# Patient Record
Sex: Male | Born: 1935 | Race: White | Hispanic: No | Smoking: Former smoker
Health system: Southern US, Community
[De-identification: ages and names within clinical notes are randomized; demographics above are authoritative.]

## PROBLEM LIST (undated history)

## (undated) DIAGNOSIS — G609 Hereditary and idiopathic neuropathy, unspecified: Secondary | ICD-10-CM

## (undated) DIAGNOSIS — E119 Type 2 diabetes mellitus without complications: Secondary | ICD-10-CM

## (undated) DIAGNOSIS — C679 Malignant neoplasm of bladder, unspecified: Secondary | ICD-10-CM

## (undated) DIAGNOSIS — E785 Hyperlipidemia, unspecified: Secondary | ICD-10-CM

## (undated) DIAGNOSIS — Z86718 Personal history of other venous thrombosis and embolism: Secondary | ICD-10-CM

## (undated) DIAGNOSIS — R509 Fever, unspecified: Secondary | ICD-10-CM

## (undated) DIAGNOSIS — E440 Moderate protein-calorie malnutrition: Secondary | ICD-10-CM

## (undated) DIAGNOSIS — K5900 Constipation, unspecified: Secondary | ICD-10-CM

## (undated) DIAGNOSIS — I82409 Acute embolism and thrombosis of unspecified deep veins of unspecified lower extremity: Secondary | ICD-10-CM

## (undated) DIAGNOSIS — I2510 Atherosclerotic heart disease of native coronary artery without angina pectoris: Secondary | ICD-10-CM

## (undated) DIAGNOSIS — D638 Anemia in other chronic diseases classified elsewhere: Secondary | ICD-10-CM

## (undated) DIAGNOSIS — R54 Age-related physical debility: Secondary | ICD-10-CM

## (undated) DIAGNOSIS — I10 Essential (primary) hypertension: Secondary | ICD-10-CM

## (undated) DIAGNOSIS — K5730 Diverticulosis of large intestine without perforation or abscess without bleeding: Secondary | ICD-10-CM

## (undated) DIAGNOSIS — R399 Unspecified symptoms and signs involving the genitourinary system: Secondary | ICD-10-CM

## (undated) DIAGNOSIS — R7881 Bacteremia: Secondary | ICD-10-CM

## (undated) DIAGNOSIS — Z9889 Other specified postprocedural states: Secondary | ICD-10-CM

## (undated) DIAGNOSIS — D649 Anemia, unspecified: Secondary | ICD-10-CM

## (undated) DIAGNOSIS — C259 Malignant neoplasm of pancreas, unspecified: Secondary | ICD-10-CM

## (undated) DIAGNOSIS — R109 Unspecified abdominal pain: Secondary | ICD-10-CM

## (undated) DIAGNOSIS — Z8781 Personal history of (healed) traumatic fracture: Secondary | ICD-10-CM

## (undated) DIAGNOSIS — B955 Unspecified streptococcus as the cause of diseases classified elsewhere: Secondary | ICD-10-CM

## (undated) DIAGNOSIS — Z974 Presence of external hearing-aid: Secondary | ICD-10-CM

## (undated) DIAGNOSIS — G4700 Insomnia, unspecified: Secondary | ICD-10-CM

## (undated) DIAGNOSIS — M1990 Unspecified osteoarthritis, unspecified site: Secondary | ICD-10-CM

## (undated) DIAGNOSIS — R740 Nonspecific elevation of levels of transaminase and lactic acid dehydrogenase [LDH]: Secondary | ICD-10-CM

## (undated) DIAGNOSIS — G9340 Encephalopathy, unspecified: Secondary | ICD-10-CM

## (undated) DIAGNOSIS — K589 Irritable bowel syndrome without diarrhea: Secondary | ICD-10-CM

## (undated) DIAGNOSIS — I5022 Chronic systolic (congestive) heart failure: Secondary | ICD-10-CM

## (undated) DIAGNOSIS — K219 Gastro-esophageal reflux disease without esophagitis: Secondary | ICD-10-CM

## (undated) DIAGNOSIS — Z973 Presence of spectacles and contact lenses: Secondary | ICD-10-CM

## (undated) DIAGNOSIS — R2681 Unsteadiness on feet: Secondary | ICD-10-CM

## (undated) DIAGNOSIS — R001 Bradycardia, unspecified: Secondary | ICD-10-CM

## (undated) DIAGNOSIS — R531 Weakness: Secondary | ICD-10-CM

## (undated) DIAGNOSIS — Z85828 Personal history of other malignant neoplasm of skin: Secondary | ICD-10-CM

## (undated) DIAGNOSIS — C787 Secondary malignant neoplasm of liver and intrahepatic bile duct: Secondary | ICD-10-CM

## (undated) DIAGNOSIS — Z7901 Long term (current) use of anticoagulants: Secondary | ICD-10-CM

## (undated) DIAGNOSIS — R609 Edema, unspecified: Secondary | ICD-10-CM

## (undated) DIAGNOSIS — L8992 Pressure ulcer of unspecified site, stage 2: Secondary | ICD-10-CM

## (undated) DIAGNOSIS — R296 Repeated falls: Secondary | ICD-10-CM

## (undated) DIAGNOSIS — IMO0002 Reserved for concepts with insufficient information to code with codable children: Secondary | ICD-10-CM

## (undated) DIAGNOSIS — Z860101 Personal history of adenomatous and serrated colon polyps: Secondary | ICD-10-CM

## (undated) DIAGNOSIS — M48061 Spinal stenosis, lumbar region without neurogenic claudication: Secondary | ICD-10-CM

## (undated) DIAGNOSIS — C799 Secondary malignant neoplasm of unspecified site: Secondary | ICD-10-CM

## (undated) DIAGNOSIS — Z8739 Personal history of other diseases of the musculoskeletal system and connective tissue: Principal | ICD-10-CM

## (undated) DIAGNOSIS — I428 Other cardiomyopathies: Secondary | ICD-10-CM

## (undated) DIAGNOSIS — Z86010 Personal history of colonic polyps: Secondary | ICD-10-CM

## (undated) DIAGNOSIS — R935 Abnormal findings on diagnostic imaging of other abdominal regions, including retroperitoneum: Secondary | ICD-10-CM

## (undated) DIAGNOSIS — I447 Left bundle-branch block, unspecified: Secondary | ICD-10-CM

## (undated) DIAGNOSIS — Z8546 Personal history of malignant neoplasm of prostate: Secondary | ICD-10-CM

## (undated) DIAGNOSIS — E43 Unspecified severe protein-calorie malnutrition: Secondary | ICD-10-CM

## (undated) DIAGNOSIS — R627 Adult failure to thrive: Secondary | ICD-10-CM

## (undated) HISTORY — DX: Acute embolism and thrombosis of unspecified deep veins of unspecified lower extremity: I82.409

## (undated) HISTORY — DX: Unspecified severe protein-calorie malnutrition: E43

## (undated) HISTORY — DX: Secondary malignant neoplasm of unspecified site: C79.9

## (undated) HISTORY — DX: Nonspecific elevation of levels of transaminase and lactic acid dehydrogenase (ldh): R74.0

## (undated) HISTORY — DX: Other cardiomyopathies: I42.8

## (undated) HISTORY — DX: Unspecified osteoarthritis, unspecified site: M19.90

## (undated) HISTORY — DX: Moderate protein-calorie malnutrition: E44.0

## (undated) HISTORY — DX: Spinal stenosis, lumbar region without neurogenic claudication: M48.061

## (undated) HISTORY — PX: CARDIAC CATHETERIZATION: SHX172

## (undated) HISTORY — DX: Bacteremia: R78.81

## (undated) HISTORY — DX: Diverticulosis of large intestine without perforation or abscess without bleeding: K57.30

## (undated) HISTORY — DX: Fever, unspecified: R50.9

## (undated) HISTORY — DX: Malignant neoplasm of pancreas, unspecified: C25.9

## (undated) HISTORY — DX: Age-related physical debility: R54

## (undated) HISTORY — PX: CARDIOVASCULAR STRESS TEST: SHX262

## (undated) HISTORY — PX: TONSILLECTOMY: SUR1361

## (undated) HISTORY — PX: OTHER SURGICAL HISTORY: SHX169

## (undated) HISTORY — DX: Abnormal findings on diagnostic imaging of other abdominal regions, including retroperitoneum: R93.5

## (undated) HISTORY — DX: Weakness: R53.1

## (undated) HISTORY — DX: Hyperlipidemia, unspecified: E78.5

## (undated) HISTORY — DX: Insomnia, unspecified: G47.00

## (undated) HISTORY — DX: Encephalopathy, unspecified: G93.40

## (undated) HISTORY — PX: APPENDECTOMY: SHX54

## (undated) HISTORY — DX: Pressure ulcer of unspecified site, stage 2: L89.92

## (undated) HISTORY — DX: Essential (primary) hypertension: I10

## (undated) HISTORY — DX: Hereditary and idiopathic neuropathy, unspecified: G60.9

## (undated) HISTORY — DX: Anemia, unspecified: D64.9

## (undated) HISTORY — DX: Gastro-esophageal reflux disease without esophagitis: K21.9

## (undated) HISTORY — DX: Adult failure to thrive: R62.7

## (undated) HISTORY — DX: Repeated falls: R29.6

## (undated) HISTORY — DX: Secondary malignant neoplasm of liver and intrahepatic bile duct: C78.7

## (undated) HISTORY — DX: Atherosclerotic heart disease of native coronary artery without angina pectoris: I25.10

## (undated) HISTORY — PX: LUMBAR SPINE SURGERY: SHX701

## (undated) HISTORY — DX: Irritable bowel syndrome, unspecified: K58.9

## (undated) HISTORY — DX: Constipation, unspecified: K59.00

## (undated) HISTORY — DX: Personal history of other diseases of the musculoskeletal system and connective tissue: Z87.39

## (undated) HISTORY — DX: Type 2 diabetes mellitus without complications: E11.9

## (undated) HISTORY — DX: Unspecified abdominal pain: R10.9

## (undated) HISTORY — DX: Bradycardia, unspecified: R00.1

## (undated) HISTORY — DX: Edema, unspecified: R60.9

## (undated) HISTORY — DX: Anemia in other chronic diseases classified elsewhere: D63.8

## (undated) HISTORY — DX: Unspecified streptococcus as the cause of diseases classified elsewhere: B95.5

## (undated) HISTORY — DX: Personal history of malignant neoplasm of prostate: Z85.46

---

## 1983-07-21 HISTORY — PX: CHOLECYSTECTOMY OPEN: SUR202

## 1989-07-20 HISTORY — PX: KNEE ARTHROSCOPY: SUR90

## 1996-07-20 HISTORY — PX: INGUINAL HERNIA REPAIR: SUR1180

## 1998-05-09 ENCOUNTER — Other Ambulatory Visit: Admission: RE | Admit: 1998-05-09 | Discharge: 1998-05-09 | Payer: Self-pay | Admitting: Urology

## 1998-05-28 ENCOUNTER — Encounter: Admission: RE | Admit: 1998-05-28 | Discharge: 1998-08-26 | Payer: Self-pay | Admitting: Radiation Oncology

## 1998-07-20 HISTORY — PX: OTHER SURGICAL HISTORY: SHX169

## 1998-07-23 ENCOUNTER — Encounter: Payer: Self-pay | Admitting: Urology

## 1998-07-23 ENCOUNTER — Ambulatory Visit (HOSPITAL_BASED_OUTPATIENT_CLINIC_OR_DEPARTMENT_OTHER): Admission: RE | Admit: 1998-07-23 | Discharge: 1998-07-23 | Payer: Self-pay | Admitting: Urology

## 1998-09-02 ENCOUNTER — Encounter: Payer: Self-pay | Admitting: Urology

## 1998-09-02 ENCOUNTER — Ambulatory Visit (HOSPITAL_COMMUNITY): Admission: RE | Admit: 1998-09-02 | Discharge: 1998-09-02 | Payer: Self-pay | Admitting: Urology

## 1998-09-13 ENCOUNTER — Encounter: Admission: RE | Admit: 1998-09-13 | Discharge: 1998-12-12 | Payer: Self-pay | Admitting: Radiation Oncology

## 1999-05-14 ENCOUNTER — Other Ambulatory Visit: Admission: RE | Admit: 1999-05-14 | Discharge: 1999-05-14 | Payer: Self-pay | Admitting: Gastroenterology

## 1999-05-14 ENCOUNTER — Encounter (INDEPENDENT_AMBULATORY_CARE_PROVIDER_SITE_OTHER): Payer: Self-pay

## 1999-12-06 ENCOUNTER — Emergency Department (HOSPITAL_COMMUNITY): Admission: EM | Admit: 1999-12-06 | Discharge: 1999-12-06 | Payer: Self-pay | Admitting: Emergency Medicine

## 1999-12-06 ENCOUNTER — Encounter: Payer: Self-pay | Admitting: Emergency Medicine

## 2000-06-07 ENCOUNTER — Other Ambulatory Visit: Admission: RE | Admit: 2000-06-07 | Discharge: 2000-06-07 | Payer: Self-pay | Admitting: Gastroenterology

## 2000-06-07 ENCOUNTER — Encounter (INDEPENDENT_AMBULATORY_CARE_PROVIDER_SITE_OTHER): Payer: Self-pay

## 2000-07-26 ENCOUNTER — Inpatient Hospital Stay (HOSPITAL_COMMUNITY): Admission: AD | Admit: 2000-07-26 | Discharge: 2000-07-31 | Payer: Self-pay | Admitting: Pulmonary Disease

## 2000-07-26 ENCOUNTER — Encounter: Payer: Self-pay | Admitting: Pulmonary Disease

## 2001-06-14 ENCOUNTER — Ambulatory Visit (HOSPITAL_COMMUNITY): Admission: RE | Admit: 2001-06-14 | Discharge: 2001-06-14 | Payer: Self-pay | Admitting: Cardiology

## 2001-06-19 ENCOUNTER — Encounter: Payer: Self-pay | Admitting: Emergency Medicine

## 2001-06-19 ENCOUNTER — Emergency Department (HOSPITAL_COMMUNITY): Admission: EM | Admit: 2001-06-19 | Discharge: 2001-06-20 | Payer: Self-pay | Admitting: Emergency Medicine

## 2002-07-20 HISTORY — PX: CATARACT EXTRACTION W/ INTRAOCULAR LENS IMPLANT: SHX1309

## 2003-12-06 ENCOUNTER — Ambulatory Visit (HOSPITAL_COMMUNITY): Admission: RE | Admit: 2003-12-06 | Discharge: 2003-12-06 | Payer: Self-pay | Admitting: Pulmonary Disease

## 2004-03-19 ENCOUNTER — Encounter: Admission: RE | Admit: 2004-03-19 | Discharge: 2004-03-19 | Payer: Self-pay | Admitting: Specialist

## 2004-06-11 ENCOUNTER — Ambulatory Visit: Payer: Self-pay | Admitting: Cardiology

## 2004-07-09 ENCOUNTER — Ambulatory Visit: Payer: Self-pay | Admitting: *Deleted

## 2004-08-06 ENCOUNTER — Ambulatory Visit: Payer: Self-pay | Admitting: Cardiology

## 2004-08-27 ENCOUNTER — Ambulatory Visit: Payer: Self-pay | Admitting: Internal Medicine

## 2004-09-10 ENCOUNTER — Ambulatory Visit: Payer: Self-pay | Admitting: Cardiology

## 2004-10-08 ENCOUNTER — Ambulatory Visit: Payer: Self-pay | Admitting: Cardiovascular Disease

## 2004-11-05 ENCOUNTER — Ambulatory Visit: Payer: Self-pay | Admitting: Internal Medicine

## 2004-12-03 ENCOUNTER — Ambulatory Visit: Payer: Self-pay | Admitting: Cardiology

## 2005-01-01 ENCOUNTER — Ambulatory Visit: Payer: Self-pay | Admitting: Internal Medicine

## 2005-01-29 ENCOUNTER — Ambulatory Visit: Payer: Self-pay | Admitting: Cardiology

## 2005-02-25 ENCOUNTER — Ambulatory Visit: Payer: Self-pay | Admitting: Cardiology

## 2005-03-09 ENCOUNTER — Ambulatory Visit: Payer: Self-pay | Admitting: Pulmonary Disease

## 2005-03-18 ENCOUNTER — Ambulatory Visit: Payer: Self-pay | Admitting: *Deleted

## 2005-04-08 ENCOUNTER — Ambulatory Visit: Payer: Self-pay | Admitting: Cardiology

## 2005-05-06 ENCOUNTER — Ambulatory Visit: Payer: Self-pay | Admitting: Cardiology

## 2005-05-07 ENCOUNTER — Ambulatory Visit: Payer: Self-pay | Admitting: Internal Medicine

## 2005-07-01 ENCOUNTER — Ambulatory Visit: Payer: Self-pay | Admitting: Cardiology

## 2005-07-29 ENCOUNTER — Ambulatory Visit: Payer: Self-pay | Admitting: *Deleted

## 2005-08-27 ENCOUNTER — Ambulatory Visit: Payer: Self-pay | Admitting: *Deleted

## 2005-09-09 ENCOUNTER — Ambulatory Visit: Payer: Self-pay | Admitting: Pulmonary Disease

## 2005-09-11 ENCOUNTER — Ambulatory Visit: Payer: Self-pay | Admitting: Pulmonary Disease

## 2005-09-23 ENCOUNTER — Ambulatory Visit: Payer: Self-pay | Admitting: Internal Medicine

## 2005-10-22 ENCOUNTER — Ambulatory Visit: Payer: Self-pay | Admitting: Cardiology

## 2005-10-26 ENCOUNTER — Encounter: Payer: Self-pay | Admitting: Internal Medicine

## 2005-10-26 ENCOUNTER — Ambulatory Visit: Payer: Self-pay

## 2005-10-29 ENCOUNTER — Ambulatory Visit: Payer: Self-pay | Admitting: Cardiology

## 2005-11-18 ENCOUNTER — Ambulatory Visit: Payer: Self-pay | Admitting: Cardiology

## 2005-12-02 ENCOUNTER — Ambulatory Visit: Payer: Self-pay | Admitting: Cardiology

## 2005-12-16 ENCOUNTER — Ambulatory Visit: Payer: Self-pay | Admitting: Cardiovascular Disease

## 2005-12-21 ENCOUNTER — Ambulatory Visit: Payer: Self-pay | Admitting: Pulmonary Disease

## 2006-01-12 ENCOUNTER — Emergency Department (HOSPITAL_COMMUNITY): Admission: EM | Admit: 2006-01-12 | Discharge: 2006-01-12 | Payer: Self-pay | Admitting: Family Medicine

## 2006-01-13 ENCOUNTER — Ambulatory Visit: Payer: Self-pay | Admitting: Cardiology

## 2006-02-10 ENCOUNTER — Ambulatory Visit: Payer: Self-pay | Admitting: Cardiology

## 2006-02-22 ENCOUNTER — Ambulatory Visit: Payer: Self-pay | Admitting: Pulmonary Disease

## 2006-02-24 ENCOUNTER — Ambulatory Visit: Payer: Self-pay | Admitting: Cardiology

## 2006-03-17 ENCOUNTER — Ambulatory Visit: Payer: Self-pay | Admitting: Cardiology

## 2006-04-14 ENCOUNTER — Ambulatory Visit: Payer: Self-pay | Admitting: Cardiology

## 2006-04-28 ENCOUNTER — Emergency Department (HOSPITAL_COMMUNITY): Admission: EM | Admit: 2006-04-28 | Discharge: 2006-04-28 | Payer: Self-pay | Admitting: Emergency Medicine

## 2006-05-05 ENCOUNTER — Ambulatory Visit: Payer: Self-pay | Admitting: Pulmonary Disease

## 2006-05-10 ENCOUNTER — Inpatient Hospital Stay (HOSPITAL_COMMUNITY): Admission: EM | Admit: 2006-05-10 | Discharge: 2006-05-14 | Payer: Self-pay | Admitting: Emergency Medicine

## 2006-05-13 ENCOUNTER — Ambulatory Visit: Payer: Self-pay | Admitting: Gastroenterology

## 2006-05-26 ENCOUNTER — Ambulatory Visit: Payer: Self-pay | Admitting: Cardiology

## 2006-05-28 ENCOUNTER — Ambulatory Visit: Payer: Self-pay | Admitting: Gastroenterology

## 2006-06-09 ENCOUNTER — Ambulatory Visit: Payer: Self-pay | Admitting: Cardiology

## 2006-06-14 ENCOUNTER — Ambulatory Visit: Payer: Self-pay | Admitting: Gastroenterology

## 2006-06-18 ENCOUNTER — Encounter: Admission: RE | Admit: 2006-06-18 | Discharge: 2006-06-18 | Payer: Self-pay | Admitting: General Surgery

## 2006-06-21 ENCOUNTER — Ambulatory Visit (HOSPITAL_BASED_OUTPATIENT_CLINIC_OR_DEPARTMENT_OTHER): Admission: RE | Admit: 2006-06-21 | Discharge: 2006-06-21 | Payer: Self-pay | Admitting: General Surgery

## 2006-06-28 ENCOUNTER — Ambulatory Visit: Payer: Self-pay | Admitting: Cardiology

## 2006-07-09 ENCOUNTER — Ambulatory Visit: Payer: Self-pay | Admitting: Cardiovascular Disease

## 2006-08-04 ENCOUNTER — Ambulatory Visit: Payer: Self-pay | Admitting: Cardiology

## 2006-08-25 ENCOUNTER — Ambulatory Visit: Payer: Self-pay | Admitting: Cardiology

## 2006-09-08 ENCOUNTER — Ambulatory Visit: Payer: Self-pay | Admitting: Pulmonary Disease

## 2006-09-15 ENCOUNTER — Ambulatory Visit: Payer: Self-pay | Admitting: Pulmonary Disease

## 2006-09-15 LAB — CONVERTED CEMR LAB
ALT: 16 units/L (ref 0–40)
AST: 18 units/L (ref 0–37)
Basophils Relative: 0.2 % (ref 0.0–1.0)
Bilirubin, Direct: 0.1 mg/dL (ref 0.0–0.3)
CO2: 32 meq/L (ref 19–32)
Calcium: 9.7 mg/dL (ref 8.4–10.5)
Chloride: 106 meq/L (ref 96–112)
Eosinophils Relative: 1.9 % (ref 0.0–5.0)
Glucose, Bld: 126 mg/dL — ABNORMAL HIGH (ref 70–99)
HCT: 40.2 % (ref 39.0–52.0)
HDL: 54.6 mg/dL (ref >39.0)
Lymphocytes Relative: 30.8 % (ref 12.0–46.0)
Neutro Abs: 3 10*3/uL (ref 1.4–7.7)
Neutrophils Relative %: 61.1 % (ref 43.0–77.0)
Platelets: 205 10*3/uL (ref 150–400)
RBC: 4.27 M/uL (ref 4.22–5.81)
Total Protein: 7 g/dL (ref 6.0–8.3)
VLDL: 22 mg/dL (ref 0–40)
WBC: 4.9 10*3/uL (ref 4.5–10.5)

## 2006-09-21 ENCOUNTER — Ambulatory Visit: Payer: Self-pay | Admitting: Cardiology

## 2006-10-20 ENCOUNTER — Ambulatory Visit: Payer: Self-pay | Admitting: Cardiology

## 2006-11-03 ENCOUNTER — Ambulatory Visit: Payer: Self-pay | Admitting: Cardiology

## 2006-12-01 ENCOUNTER — Ambulatory Visit: Payer: Self-pay | Admitting: *Deleted

## 2006-12-15 ENCOUNTER — Ambulatory Visit: Payer: Self-pay | Admitting: Cardiology

## 2006-12-15 LAB — CONVERTED CEMR LAB
ALT: 20 units/L (ref 0–40)
Albumin: 4.2 g/dL (ref 3.5–5.2)
Alkaline Phosphatase: 44 units/L (ref 39–117)
Total CHOL/HDL Ratio: 2.8
VLDL: 15 mg/dL (ref 0–40)

## 2006-12-29 ENCOUNTER — Ambulatory Visit: Payer: Self-pay | Admitting: Cardiology

## 2007-01-11 ENCOUNTER — Ambulatory Visit: Payer: Self-pay | Admitting: Cardiology

## 2007-02-02 ENCOUNTER — Ambulatory Visit: Payer: Self-pay | Admitting: Pulmonary Disease

## 2007-02-07 ENCOUNTER — Ambulatory Visit: Payer: Self-pay

## 2007-02-09 ENCOUNTER — Ambulatory Visit: Payer: Self-pay | Admitting: Cardiology

## 2007-02-21 ENCOUNTER — Inpatient Hospital Stay (HOSPITAL_COMMUNITY): Admission: RE | Admit: 2007-02-21 | Discharge: 2007-02-24 | Payer: Self-pay | Admitting: Orthopedic Surgery

## 2007-02-21 HISTORY — PX: TOTAL KNEE ARTHROPLASTY: SHX125

## 2007-03-29 ENCOUNTER — Ambulatory Visit: Payer: Self-pay | Admitting: Cardiology

## 2007-04-21 ENCOUNTER — Ambulatory Visit (HOSPITAL_COMMUNITY): Admission: RE | Admit: 2007-04-21 | Discharge: 2007-04-22 | Payer: Self-pay | Admitting: Orthopedic Surgery

## 2007-04-26 ENCOUNTER — Ambulatory Visit: Payer: Self-pay | Admitting: Cardiovascular Disease

## 2007-05-03 DIAGNOSIS — K589 Irritable bowel syndrome without diarrhea: Secondary | ICD-10-CM | POA: Insufficient documentation

## 2007-05-03 DIAGNOSIS — E1140 Type 2 diabetes mellitus with diabetic neuropathy, unspecified: Secondary | ICD-10-CM

## 2007-05-03 DIAGNOSIS — K219 Gastro-esophageal reflux disease without esophagitis: Secondary | ICD-10-CM

## 2007-05-03 DIAGNOSIS — Z8546 Personal history of malignant neoplasm of prostate: Secondary | ICD-10-CM

## 2007-05-03 DIAGNOSIS — E119 Type 2 diabetes mellitus without complications: Secondary | ICD-10-CM

## 2007-05-03 DIAGNOSIS — I82409 Acute embolism and thrombosis of unspecified deep veins of unspecified lower extremity: Secondary | ICD-10-CM

## 2007-05-03 DIAGNOSIS — M1990 Unspecified osteoarthritis, unspecified site: Secondary | ICD-10-CM

## 2007-05-03 DIAGNOSIS — E785 Hyperlipidemia, unspecified: Secondary | ICD-10-CM

## 2007-05-03 HISTORY — DX: Hyperlipidemia, unspecified: E78.5

## 2007-05-03 HISTORY — DX: Unspecified osteoarthritis, unspecified site: M19.90

## 2007-05-03 HISTORY — DX: Gastro-esophageal reflux disease without esophagitis: K21.9

## 2007-05-03 HISTORY — DX: Acute embolism and thrombosis of unspecified deep veins of unspecified lower extremity: I82.409

## 2007-05-03 HISTORY — DX: Type 2 diabetes mellitus without complications: E11.9

## 2007-05-04 ENCOUNTER — Ambulatory Visit: Payer: Self-pay | Admitting: Cardiovascular Disease

## 2007-05-18 ENCOUNTER — Ambulatory Visit: Payer: Self-pay | Admitting: Cardiology

## 2007-05-24 ENCOUNTER — Ambulatory Visit: Payer: Self-pay | Admitting: Pulmonary Disease

## 2007-05-25 ENCOUNTER — Ambulatory Visit: Payer: Self-pay | Admitting: Cardiology

## 2007-05-25 LAB — CONVERTED CEMR LAB
Basophils Absolute: 0 10*3/uL (ref 0.0–0.1)
Creatinine, Ser: 1 mg/dL (ref 0.4–1.5)
HCT: 38.4 % — ABNORMAL LOW (ref 39.0–52.0)
Hemoglobin: 13.6 g/dL (ref 13.0–17.0)
MCHC: 35.3 g/dL (ref 30.0–36.0)
MCV: 92.6 fL (ref 78.0–100.0)
Monocytes Absolute: 0.4 10*3/uL (ref 0.2–0.7)
Neutrophils Relative %: 70.3 % (ref 43.0–77.0)
Potassium: 4.3 meq/L (ref 3.5–5.1)
Pro B Natriuretic peptide (BNP): 61 pg/mL (ref 0.0–100.0)
RDW: 12.9 % (ref 11.5–14.6)
Sodium: 141 meq/L (ref 135–145)

## 2007-06-03 ENCOUNTER — Ambulatory Visit: Payer: Self-pay | Admitting: Cardiology

## 2007-06-03 ENCOUNTER — Ambulatory Visit: Payer: Self-pay

## 2007-06-03 ENCOUNTER — Encounter: Payer: Self-pay | Admitting: Cardiology

## 2007-06-29 ENCOUNTER — Ambulatory Visit: Payer: Self-pay | Admitting: Cardiovascular Disease

## 2007-07-06 ENCOUNTER — Ambulatory Visit: Payer: Self-pay | Admitting: Pulmonary Disease

## 2007-07-06 DIAGNOSIS — I428 Other cardiomyopathies: Secondary | ICD-10-CM

## 2007-07-06 DIAGNOSIS — I2510 Atherosclerotic heart disease of native coronary artery without angina pectoris: Secondary | ICD-10-CM

## 2007-07-06 DIAGNOSIS — I447 Left bundle-branch block, unspecified: Secondary | ICD-10-CM

## 2007-07-06 DIAGNOSIS — M48061 Spinal stenosis, lumbar region without neurogenic claudication: Secondary | ICD-10-CM | POA: Insufficient documentation

## 2007-07-06 DIAGNOSIS — G609 Hereditary and idiopathic neuropathy, unspecified: Secondary | ICD-10-CM | POA: Insufficient documentation

## 2007-07-06 DIAGNOSIS — K5730 Diverticulosis of large intestine without perforation or abscess without bleeding: Secondary | ICD-10-CM | POA: Insufficient documentation

## 2007-07-06 HISTORY — DX: Other cardiomyopathies: I42.8

## 2007-07-06 HISTORY — DX: Atherosclerotic heart disease of native coronary artery without angina pectoris: I25.10

## 2007-07-06 HISTORY — DX: Spinal stenosis, lumbar region without neurogenic claudication: M48.061

## 2007-07-06 HISTORY — DX: Hereditary and idiopathic neuropathy, unspecified: G60.9

## 2007-07-27 ENCOUNTER — Ambulatory Visit: Payer: Self-pay | Admitting: Cardiovascular Disease

## 2007-08-24 ENCOUNTER — Ambulatory Visit: Payer: Self-pay | Admitting: Internal Medicine

## 2007-09-18 HISTORY — PX: MOHS SURGERY: SUR867

## 2007-09-21 ENCOUNTER — Ambulatory Visit: Payer: Self-pay | Admitting: Cardiology

## 2007-10-19 ENCOUNTER — Ambulatory Visit: Payer: Self-pay | Admitting: Internal Medicine

## 2007-10-24 ENCOUNTER — Telehealth: Payer: Self-pay | Admitting: Pulmonary Disease

## 2007-11-16 ENCOUNTER — Ambulatory Visit: Payer: Self-pay | Admitting: Cardiology

## 2007-12-01 ENCOUNTER — Ambulatory Visit: Payer: Self-pay | Admitting: Cardiology

## 2007-12-19 ENCOUNTER — Ambulatory Visit: Payer: Self-pay | Admitting: Cardiovascular Disease

## 2008-01-04 ENCOUNTER — Ambulatory Visit: Payer: Self-pay | Admitting: Pulmonary Disease

## 2008-01-06 ENCOUNTER — Ambulatory Visit: Payer: Self-pay | Admitting: Pulmonary Disease

## 2008-01-07 LAB — CONVERTED CEMR LAB
ALT: 16 units/L (ref 0–53)
AST: 18 units/L (ref 0–37)
Albumin: 3.9 g/dL (ref 3.5–5.2)
Alkaline Phosphatase: 44 units/L (ref 39–117)
BUN: 20 mg/dL (ref 6–23)
Basophils Relative: 0.4 % (ref 0.0–1.0)
CO2: 31 meq/L (ref 19–32)
Chloride: 104 meq/L (ref 96–112)
Creatinine, Ser: 1.1 mg/dL (ref 0.4–1.5)
Eosinophils Relative: 3.1 % (ref 0.0–5.0)
Glucose, Bld: 118 mg/dL — ABNORMAL HIGH (ref 70–99)
Hgb A1c MFr Bld: 6.3 % — ABNORMAL HIGH (ref 4.6–6.0)
LDL Cholesterol: 110 mg/dL — ABNORMAL HIGH (ref 0–99)
Lymphocytes Relative: 31.2 % (ref 12.0–46.0)
MCV: 96.1 fL (ref 78.0–100.0)
Monocytes Relative: 7.3 % (ref 3.0–12.0)
Neutrophils Relative %: 58 % (ref 43.0–77.0)
RBC: 3.9 M/uL — ABNORMAL LOW (ref 4.22–5.81)
Total CHOL/HDL Ratio: 3.3
VLDL: 16 mg/dL (ref 0–40)
WBC: 5.1 10*3/uL (ref 4.5–10.5)

## 2008-01-18 ENCOUNTER — Ambulatory Visit: Payer: Self-pay | Admitting: Internal Medicine

## 2008-01-27 ENCOUNTER — Encounter: Payer: Self-pay | Admitting: Pulmonary Disease

## 2008-02-15 ENCOUNTER — Ambulatory Visit: Payer: Self-pay | Admitting: Cardiology

## 2008-03-14 ENCOUNTER — Ambulatory Visit: Payer: Self-pay | Admitting: Cardiovascular Disease

## 2008-04-11 ENCOUNTER — Ambulatory Visit: Payer: Self-pay | Admitting: Cardiology

## 2008-05-02 ENCOUNTER — Ambulatory Visit: Payer: Self-pay | Admitting: Cardiology

## 2008-05-09 ENCOUNTER — Telehealth: Payer: Self-pay | Admitting: Pulmonary Disease

## 2008-05-10 ENCOUNTER — Telehealth (INDEPENDENT_AMBULATORY_CARE_PROVIDER_SITE_OTHER): Payer: Self-pay | Admitting: *Deleted

## 2008-05-23 ENCOUNTER — Ambulatory Visit: Payer: Self-pay | Admitting: Cardiovascular Disease

## 2008-06-13 ENCOUNTER — Ambulatory Visit: Payer: Self-pay | Admitting: Cardiovascular Disease

## 2008-06-28 ENCOUNTER — Telehealth (INDEPENDENT_AMBULATORY_CARE_PROVIDER_SITE_OTHER): Payer: Self-pay | Admitting: *Deleted

## 2008-07-11 ENCOUNTER — Ambulatory Visit: Payer: Self-pay | Admitting: Cardiovascular Disease

## 2008-08-08 ENCOUNTER — Ambulatory Visit: Payer: Self-pay | Admitting: Internal Medicine

## 2008-08-22 ENCOUNTER — Ambulatory Visit: Payer: Self-pay | Admitting: Cardiology

## 2008-08-22 ENCOUNTER — Ambulatory Visit: Payer: Self-pay | Admitting: Pulmonary Disease

## 2008-08-23 ENCOUNTER — Ambulatory Visit: Payer: Self-pay | Admitting: Pulmonary Disease

## 2008-08-26 LAB — CONVERTED CEMR LAB
ALT: 14 units/L (ref 0–53)
AST: 22 units/L (ref 0–37)
Alkaline Phosphatase: 44 units/L (ref 39–117)
Bilirubin, Direct: 0.1 mg/dL (ref 0.0–0.3)
CO2: 27 meq/L (ref 19–32)
Chloride: 107 meq/L (ref 96–112)
Eosinophils Relative: 0.1 % (ref 0.0–5.0)
Glucose, Bld: 189 mg/dL — ABNORMAL HIGH (ref 70–99)
HCT: 38.7 % — ABNORMAL LOW (ref 39.0–52.0)
Hemoglobin: 13.3 g/dL (ref 13.0–17.0)
Lymphocytes Relative: 8.9 % — ABNORMAL LOW (ref 12.0–46.0)
Monocytes Absolute: 0 10*3/uL — ABNORMAL LOW (ref 0.1–1.0)
Monocytes Relative: 0.5 % — ABNORMAL LOW (ref 3.0–12.0)
Mucus, UA: NEGATIVE
Neutro Abs: 8.4 10*3/uL — ABNORMAL HIGH (ref 1.4–7.7)
Nitrite: NEGATIVE
PSA: 0.02 ng/mL — ABNORMAL LOW (ref 0.10–4.00)
Platelets: 201 10*3/uL (ref 150–400)
Potassium: 5.1 meq/L (ref 3.5–5.1)
Sodium: 141 meq/L (ref 135–145)
Total Bilirubin: 0.7 mg/dL (ref 0.3–1.2)
Total CHOL/HDL Ratio: 3.5
Total Protein: 7.2 g/dL (ref 6.0–8.3)
Urine Glucose: NEGATIVE mg/dL
VLDL: 9 mg/dL (ref 0–40)
WBC: 9.2 10*3/uL (ref 4.5–10.5)
pH: 5.5 (ref 5.0–8.0)

## 2008-08-27 ENCOUNTER — Telehealth (INDEPENDENT_AMBULATORY_CARE_PROVIDER_SITE_OTHER): Payer: Self-pay | Admitting: *Deleted

## 2008-09-05 ENCOUNTER — Encounter: Payer: Self-pay | Admitting: Pulmonary Disease

## 2008-09-12 ENCOUNTER — Ambulatory Visit: Payer: Self-pay | Admitting: Cardiology

## 2008-09-20 ENCOUNTER — Encounter: Payer: Self-pay | Admitting: Pulmonary Disease

## 2008-10-10 ENCOUNTER — Ambulatory Visit: Payer: Self-pay | Admitting: Internal Medicine

## 2008-11-07 ENCOUNTER — Ambulatory Visit: Payer: Self-pay | Admitting: Internal Medicine

## 2008-11-20 DIAGNOSIS — I5022 Chronic systolic (congestive) heart failure: Secondary | ICD-10-CM

## 2008-11-21 ENCOUNTER — Ambulatory Visit: Payer: Self-pay | Admitting: Cardiovascular Disease

## 2008-11-21 ENCOUNTER — Ambulatory Visit: Payer: Self-pay | Admitting: Cardiology

## 2008-11-29 ENCOUNTER — Encounter: Payer: Self-pay | Admitting: Pulmonary Disease

## 2008-12-05 ENCOUNTER — Ambulatory Visit: Payer: Self-pay

## 2008-12-05 ENCOUNTER — Encounter: Payer: Self-pay | Admitting: Cardiology

## 2008-12-12 ENCOUNTER — Ambulatory Visit: Payer: Self-pay | Admitting: Internal Medicine

## 2008-12-18 ENCOUNTER — Encounter: Payer: Self-pay | Admitting: *Deleted

## 2008-12-26 ENCOUNTER — Telehealth: Payer: Self-pay | Admitting: Pulmonary Disease

## 2009-01-09 ENCOUNTER — Ambulatory Visit: Payer: Self-pay | Admitting: Internal Medicine

## 2009-01-23 ENCOUNTER — Encounter: Payer: Self-pay | Admitting: *Deleted

## 2009-02-06 ENCOUNTER — Ambulatory Visit: Payer: Self-pay | Admitting: Internal Medicine

## 2009-02-06 LAB — CONVERTED CEMR LAB
POC INR: 2.4
Prothrombin Time: 18.7 s

## 2009-02-11 ENCOUNTER — Telehealth: Payer: Self-pay | Admitting: Pulmonary Disease

## 2009-02-20 ENCOUNTER — Ambulatory Visit: Payer: Self-pay | Admitting: Pulmonary Disease

## 2009-02-25 ENCOUNTER — Ambulatory Visit: Payer: Self-pay | Admitting: Pulmonary Disease

## 2009-02-27 ENCOUNTER — Encounter: Payer: Self-pay | Admitting: Cardiology

## 2009-03-06 ENCOUNTER — Ambulatory Visit: Payer: Self-pay | Admitting: Internal Medicine

## 2009-03-06 LAB — CONVERTED CEMR LAB: POC INR: 3.2

## 2009-03-14 LAB — CONVERTED CEMR LAB
AST: 15 units/L (ref 0–37)
Albumin: 3.9 g/dL (ref 3.5–5.2)
Alkaline Phosphatase: 49 units/L (ref 39–117)
BUN: 21 mg/dL (ref 6–23)
Basophils Absolute: 0 10*3/uL (ref 0.0–0.1)
CO2: 30 meq/L (ref 19–32)
Calcium: 9.1 mg/dL (ref 8.4–10.5)
Cholesterol: 157 mg/dL (ref 0–200)
Eosinophils Relative: 2.1 % (ref 0.0–5.0)
Glucose, Bld: 109 mg/dL — ABNORMAL HIGH (ref 70–99)
HDL: 52.2 mg/dL (ref >39.00)
Hgb A1c MFr Bld: 6.3 % (ref 4.6–6.5)
Lymphocytes Relative: 28.4 % (ref 12.0–46.0)
Monocytes Relative: 5.5 % (ref 3.0–12.0)
PSA: 0.02 ng/mL — ABNORMAL LOW (ref 0.10–4.00)
Platelets: 175 10*3/uL (ref 150.0–400.0)
Potassium: 4.4 meq/L (ref 3.5–5.1)
RDW: 14 % (ref 11.5–14.6)
Sodium: 142 meq/L (ref 135–145)
TSH: 1.36 microintl units/mL (ref 0.35–5.50)
Total Protein: 7.4 g/dL (ref 6.0–8.3)
VLDL: 17.4 mg/dL (ref 0.0–40.0)
WBC: 6.4 10*3/uL (ref 4.5–10.5)

## 2009-04-03 ENCOUNTER — Ambulatory Visit: Payer: Self-pay | Admitting: Internal Medicine

## 2009-05-01 ENCOUNTER — Ambulatory Visit: Payer: Self-pay | Admitting: Internal Medicine

## 2009-05-29 ENCOUNTER — Encounter: Payer: Self-pay | Admitting: Pulmonary Disease

## 2009-05-29 ENCOUNTER — Ambulatory Visit: Payer: Self-pay | Admitting: Internal Medicine

## 2009-06-19 ENCOUNTER — Telehealth: Payer: Self-pay | Admitting: Pulmonary Disease

## 2009-06-19 ENCOUNTER — Encounter: Payer: Self-pay | Admitting: Pulmonary Disease

## 2009-06-19 ENCOUNTER — Encounter: Payer: Self-pay | Admitting: Cardiology

## 2009-06-21 ENCOUNTER — Telehealth: Payer: Self-pay | Admitting: Cardiology

## 2009-06-26 ENCOUNTER — Ambulatory Visit: Payer: Self-pay | Admitting: Cardiology

## 2009-06-26 ENCOUNTER — Telehealth: Payer: Self-pay | Admitting: Pulmonary Disease

## 2009-06-26 LAB — CONVERTED CEMR LAB: POC INR: 3.8

## 2009-06-28 ENCOUNTER — Telehealth: Payer: Self-pay | Admitting: Cardiology

## 2009-07-02 ENCOUNTER — Telehealth: Payer: Self-pay | Admitting: Cardiology

## 2009-07-03 ENCOUNTER — Ambulatory Visit: Payer: Self-pay

## 2009-07-03 ENCOUNTER — Ambulatory Visit: Payer: Self-pay | Admitting: Internal Medicine

## 2009-07-03 ENCOUNTER — Ambulatory Visit (HOSPITAL_COMMUNITY): Admission: RE | Admit: 2009-07-03 | Discharge: 2009-07-03 | Payer: Self-pay | Admitting: Cardiology

## 2009-07-03 ENCOUNTER — Ambulatory Visit: Payer: Self-pay | Admitting: Cardiology

## 2009-07-03 ENCOUNTER — Encounter: Payer: Self-pay | Admitting: Cardiology

## 2009-07-10 ENCOUNTER — Ambulatory Visit: Payer: Self-pay | Admitting: Internal Medicine

## 2009-07-10 ENCOUNTER — Encounter (INDEPENDENT_AMBULATORY_CARE_PROVIDER_SITE_OTHER): Payer: Self-pay | Admitting: Cardiology

## 2009-07-31 ENCOUNTER — Ambulatory Visit: Payer: Self-pay | Admitting: Internal Medicine

## 2009-07-31 ENCOUNTER — Encounter (INDEPENDENT_AMBULATORY_CARE_PROVIDER_SITE_OTHER): Payer: Self-pay | Admitting: Cardiology

## 2009-08-21 ENCOUNTER — Ambulatory Visit: Payer: Self-pay | Admitting: Pulmonary Disease

## 2009-08-26 ENCOUNTER — Ambulatory Visit: Payer: Self-pay | Admitting: Cardiovascular Disease

## 2009-08-26 LAB — CONVERTED CEMR LAB: POC INR: 3.7

## 2009-09-09 ENCOUNTER — Ambulatory Visit: Payer: Self-pay | Admitting: Internal Medicine

## 2009-09-09 LAB — CONVERTED CEMR LAB: POC INR: 2.4

## 2009-09-10 ENCOUNTER — Encounter: Payer: Self-pay | Admitting: Pulmonary Disease

## 2009-09-25 ENCOUNTER — Ambulatory Visit: Payer: Self-pay | Admitting: Cardiovascular Disease

## 2009-09-25 LAB — CONVERTED CEMR LAB: POC INR: 1.9

## 2009-10-07 ENCOUNTER — Telehealth: Payer: Self-pay | Admitting: Cardiology

## 2009-10-16 ENCOUNTER — Ambulatory Visit: Payer: Self-pay | Admitting: Cardiovascular Disease

## 2009-10-16 ENCOUNTER — Encounter: Payer: Self-pay | Admitting: Cardiology

## 2009-10-16 LAB — CONVERTED CEMR LAB: POC INR: 2

## 2009-10-18 ENCOUNTER — Telehealth: Payer: Self-pay | Admitting: Cardiology

## 2009-11-13 ENCOUNTER — Encounter: Payer: Self-pay | Admitting: Pulmonary Disease

## 2009-11-13 ENCOUNTER — Ambulatory Visit: Payer: Self-pay | Admitting: Internal Medicine

## 2009-11-13 ENCOUNTER — Telehealth: Payer: Self-pay | Admitting: Pulmonary Disease

## 2009-11-13 LAB — CONVERTED CEMR LAB: POC INR: 2.1

## 2009-12-11 ENCOUNTER — Ambulatory Visit: Payer: Self-pay | Admitting: Internal Medicine

## 2009-12-25 ENCOUNTER — Ambulatory Visit: Payer: Self-pay | Admitting: Cardiovascular Disease

## 2009-12-25 ENCOUNTER — Ambulatory Visit: Payer: Self-pay | Admitting: Cardiology

## 2010-01-01 ENCOUNTER — Ambulatory Visit: Payer: Self-pay | Admitting: Cardiology

## 2010-01-10 ENCOUNTER — Telehealth: Payer: Self-pay | Admitting: Cardiology

## 2010-01-10 LAB — CONVERTED CEMR LAB
CO2: 30 meq/L (ref 19–32)
Calcium: 9.6 mg/dL (ref 8.4–10.5)
GFR calc non Af Amer: 73 mL/min (ref >60)
Sodium: 141 meq/L (ref 135–145)

## 2010-01-15 ENCOUNTER — Ambulatory Visit: Payer: Self-pay | Admitting: Cardiology

## 2010-01-30 ENCOUNTER — Ambulatory Visit: Payer: Self-pay | Admitting: Internal Medicine

## 2010-01-30 LAB — CONVERTED CEMR LAB: POC INR: 2.1

## 2010-02-19 ENCOUNTER — Ambulatory Visit: Payer: Self-pay | Admitting: Cardiology

## 2010-02-19 ENCOUNTER — Ambulatory Visit: Payer: Self-pay | Admitting: Pulmonary Disease

## 2010-02-19 LAB — CONVERTED CEMR LAB: POC INR: 2.5

## 2010-02-20 ENCOUNTER — Ambulatory Visit: Payer: Self-pay | Admitting: Pulmonary Disease

## 2010-02-22 LAB — CONVERTED CEMR LAB
Albumin: 4.1 g/dL (ref 3.5–5.2)
Alkaline Phosphatase: 40 units/L (ref 39–117)
BUN: 25 mg/dL — ABNORMAL HIGH (ref 6–23)
Basophils Absolute: 0 10*3/uL (ref 0.0–0.1)
CO2: 27 meq/L (ref 19–32)
Calcium: 9.7 mg/dL (ref 8.4–10.5)
Chloride: 105 meq/L (ref 96–112)
Creatinine, Ser: 1 mg/dL (ref 0.4–1.5)
Eosinophils Relative: 2.9 % (ref 0.0–5.0)
Glucose, Bld: 112 mg/dL — ABNORMAL HIGH (ref 70–99)
HCT: 35.9 % — ABNORMAL LOW (ref 39.0–52.0)
Hemoglobin: 12.4 g/dL — ABNORMAL LOW (ref 13.0–17.0)
LDL Cholesterol: 102 mg/dL — ABNORMAL HIGH (ref 0–99)
Lymphs Abs: 1.7 10*3/uL (ref 0.7–4.0)
MCV: 94.6 fL (ref 78.0–100.0)
Monocytes Absolute: 0.3 10*3/uL (ref 0.1–1.0)
Monocytes Relative: 6.6 % (ref 3.0–12.0)
Neutro Abs: 2.2 10*3/uL (ref 1.4–7.7)
RDW: 14.2 % (ref 11.5–14.6)
Total Bilirubin: 0.6 mg/dL (ref 0.3–1.2)
Total CHOL/HDL Ratio: 3
Triglycerides: 87 mg/dL (ref 0.0–149.0)

## 2010-03-12 ENCOUNTER — Telehealth: Payer: Self-pay | Admitting: Pulmonary Disease

## 2010-03-19 ENCOUNTER — Ambulatory Visit: Payer: Self-pay | Admitting: Cardiology

## 2010-03-19 LAB — CONVERTED CEMR LAB: POC INR: 2.5

## 2010-04-01 ENCOUNTER — Telehealth: Payer: Self-pay | Admitting: Cardiology

## 2010-04-16 ENCOUNTER — Ambulatory Visit: Payer: Self-pay | Admitting: Internal Medicine

## 2010-04-16 LAB — CONVERTED CEMR LAB: POC INR: 3

## 2010-04-19 ENCOUNTER — Encounter: Payer: Self-pay | Admitting: Pulmonary Disease

## 2010-05-13 ENCOUNTER — Telehealth: Payer: Self-pay | Admitting: Pulmonary Disease

## 2010-05-14 ENCOUNTER — Ambulatory Visit: Payer: Self-pay | Admitting: Internal Medicine

## 2010-06-11 ENCOUNTER — Ambulatory Visit: Payer: Self-pay | Admitting: Internal Medicine

## 2010-06-11 LAB — CONVERTED CEMR LAB: POC INR: 2.2

## 2010-06-23 ENCOUNTER — Encounter: Payer: Self-pay | Admitting: Cardiology

## 2010-06-23 ENCOUNTER — Ambulatory Visit: Payer: Self-pay | Admitting: Cardiology

## 2010-06-23 DIAGNOSIS — I10 Essential (primary) hypertension: Secondary | ICD-10-CM

## 2010-06-23 HISTORY — DX: Essential (primary) hypertension: I10

## 2010-07-09 ENCOUNTER — Ambulatory Visit: Payer: Self-pay | Admitting: Cardiology

## 2010-08-06 ENCOUNTER — Ambulatory Visit: Admission: RE | Admit: 2010-08-06 | Discharge: 2010-08-06 | Payer: Self-pay | Source: Home / Self Care

## 2010-08-17 LAB — CONVERTED CEMR LAB
BUN: 21 mg/dL (ref 6–23)
BUN: 25 mg/dL — ABNORMAL HIGH (ref 6–23)
Basophils Absolute: 0 10*3/uL (ref 0.0–0.1)
CO2: 31 meq/L (ref 19–32)
Chloride: 107 meq/L (ref 96–112)
Creatinine, Ser: 1.2 mg/dL (ref 0.4–1.5)
Creatinine, Ser: 1.2 mg/dL (ref 0.4–1.5)
GFR calc non Af Amer: 63.65 mL/min (ref >60)
Glucose, Bld: 130 mg/dL — ABNORMAL HIGH (ref 70–99)
HCT: 35.4 % — ABNORMAL LOW (ref 39.0–52.0)
Lymphs Abs: 1.2 10*3/uL (ref 0.7–4.0)
Monocytes Absolute: 0.3 10*3/uL (ref 0.1–1.0)
Monocytes Relative: 5.7 % (ref 3.0–12.0)
Platelets: 200 10*3/uL (ref 150.0–400.0)
Potassium: 4.2 meq/L (ref 3.5–5.1)
Potassium: 4.6 meq/L (ref 3.5–5.1)
Pro B Natriuretic peptide (BNP): 60 pg/mL (ref 0.0–100.0)
RDW: 13.6 % (ref 11.5–14.6)

## 2010-08-20 ENCOUNTER — Ambulatory Visit (INDEPENDENT_AMBULATORY_CARE_PROVIDER_SITE_OTHER): Payer: Medicare Other | Admitting: Pulmonary Disease

## 2010-08-20 ENCOUNTER — Encounter: Payer: Self-pay | Admitting: Pulmonary Disease

## 2010-08-20 ENCOUNTER — Ambulatory Visit: Admit: 2010-08-20 | Payer: Self-pay | Admitting: Pulmonary Disease

## 2010-08-20 DIAGNOSIS — I82409 Acute embolism and thrombosis of unspecified deep veins of unspecified lower extremity: Secondary | ICD-10-CM

## 2010-08-20 DIAGNOSIS — E785 Hyperlipidemia, unspecified: Secondary | ICD-10-CM

## 2010-08-20 DIAGNOSIS — I2510 Atherosclerotic heart disease of native coronary artery without angina pectoris: Secondary | ICD-10-CM

## 2010-08-20 DIAGNOSIS — E119 Type 2 diabetes mellitus without complications: Secondary | ICD-10-CM

## 2010-08-20 DIAGNOSIS — I447 Left bundle-branch block, unspecified: Secondary | ICD-10-CM

## 2010-08-20 DIAGNOSIS — I10 Essential (primary) hypertension: Secondary | ICD-10-CM

## 2010-08-20 DIAGNOSIS — I5022 Chronic systolic (congestive) heart failure: Secondary | ICD-10-CM

## 2010-08-20 DIAGNOSIS — I428 Other cardiomyopathies: Secondary | ICD-10-CM

## 2010-08-21 ENCOUNTER — Other Ambulatory Visit: Payer: Self-pay | Admitting: Pulmonary Disease

## 2010-08-21 ENCOUNTER — Other Ambulatory Visit: Payer: Medicare Other

## 2010-08-21 LAB — BASIC METABOLIC PANEL
BUN: 24 mg/dL — ABNORMAL HIGH (ref 6–23)
GFR: 72.89 mL/min (ref >60.00)
Potassium: 4.4 mEq/L (ref 3.5–5.1)
Sodium: 138 mEq/L (ref 135–145)

## 2010-08-21 LAB — LIPID PANEL
LDL Cholesterol: 86 mg/dL (ref 0–99)
VLDL: 16.4 mg/dL (ref 0.0–40.0)

## 2010-08-21 NOTE — Medication Information (Signed)
Summary: rov.mp  Anticoagulant Therapy  Managed by: Shelby Dubin, PharmD, BCPS, CPP Referring MD: Charlies Constable MD PCP: dr Merry Proud MD: Jens Som MD, Arlys John Indication 1: Deep Vein Thrombosis - Leg (ICD-451.1) Lab Used: [facility] Site: [facility] INR POC 1.7 INR RANGE 2 - 3  Dietary changes: no    Health status changes: no    Bleeding/hemorrhagic complications: no    Recent/future hospitalizations: no    Any changes in medication regimen? no    Recent/future dental: no  Any missed doses?: no       Is patient compliant with meds? yes       Allergies (verified): No Known Drug Allergies  Anticoagulation Management History:      The patient is taking warfarin and comes in today for a routine follow up visit.  Positive risk factors for bleeding include an age of 75 years or older and presence of serious comorbidities.  The bleeding index is 'intermediate risk'.  Positive CHADS2 values include History of CHF, Age > 50 years old, and History of Diabetes.  The start date was 07/31/2000.  Anticoagulation responsible provider: Jens Som MD, Arlys John.  INR POC: 1.7.  Cuvette Lot#: [id number].  Exp: 08/2010.    Anticoagulation Management Assessment/Plan:      The patient's current anticoagulation dose is Coumadin 2 mg  tabs: as directed by the coumadin clinic.  The target INR is 2.0-3.0.  The next INR is due 08/24/2009.  Anticoagulation instructions were given to patient.  Results were reviewed/authorized by Shelby Dubin, PharmD, BCPS, CPP.  He was notified by Shelby Dubin PharmD, BCPS, CPP.         Prior Anticoagulation Instructions: INR 3.3  Skip today's dose, then take 1 tab each Monday and Friday and 2 tabs on all other days.    Current Anticoagulation Instructions: INR 1.7  Take 3 tabs today only, then increase to 2 tabs daily except 1 tab each Friday.    Recheck in 3 weeks.

## 2010-08-21 NOTE — Medication Information (Signed)
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 no    Recent/future dental: no  Any missed doses?: no       Is patient compliant with meds? yes       Allergies: No Known Drug Allergies  Anticoagulation Management History:      The patient is taking warfarin and comes in today for a routine follow up visit.  Positive risk factors for bleeding include an age of 14 years or older and presence of serious comorbidities.  The bleeding index is 'intermediate risk'.  Positive CHADS2 values include History of CHF, Age > 39 years old, and History of Diabetes.  The start date was 07/31/2000.  Anticoagulation responsible provider: Eden Emms MD, Theron Arista.  INR POC: 2.2.  Cuvette Lot#: [id number].  Exp: 02/2011.    Anticoagulation Management Assessment/Plan:      The patient's current anticoagulation dose is Coumadin 2 mg  tabs: as directed by the coumadin clinic.  The target INR is 2.0-3.0.  The next INR is due 01/15/2010.  Anticoagulation instructions were given to patient.  Results were reviewed/authorized by Elaina Pattee, PharmD.  He was notified by Elaina Pattee, PharmD.         Prior Anticoagulation Instructions: INR 1.5  Take 3 tablets today and tomorrow then resume same dose of 2 tablets every day except 1 tablet on Monday and Friday   Current Anticoagulation Instructions: INR 2.2. Take 2 tablets daily except 1 tablet on Mon and Fri.

## 2010-08-21 NOTE — Progress Notes (Signed)
Summary: diabetic shoes  Phone Note Call from Patient Call back at Home Phone [phone]   Caller: Patient Call For: [name] [name] Reason for Call: Talk to Nurse Summary of Call: Call Stanton Kidney - at [facility] [phone] & let her know we sent form for diabetic shoes in Feb. Initial call taken by: Eugene Gavia,  November 13, 2009 4:44 PM  Follow-up for Phone Call        Need CMN for diabetic shoes redone.  Per medicare guidelines the pt had to be seen at least 6 months prior to having form filled out.  The form the we faxed on 09/10/09 states that pt's last ov with SN was on 02/20/09, but it was actually on 08/21/09.  Form recieived and placed on SN's cart Follow-up by: Vernie Murders,  November 13, 2009 4:58 PM  Additional Follow-up for Phone Call Additional follow up Details #1::        SN has signed form and it will be faxed back to company. Randell Loop CMA  November 13, 2009 5:07 PM

## 2010-08-21 NOTE — Miscellaneous (Signed)
Summary: Fl[facility]  [facility]   Imported By: Lester Newville 04/29/2010 08:13:34  _____________________________________________________________________  External Attachment:    Type:   Image     Comment:   External Document

## 2010-08-21 NOTE — Progress Notes (Signed)
Summary: F/U to Orthostatic BP's   Phone Note Outgoing Call   Call placed by: Sherri Rad, RN, BSN,  October 18, 2009 6:29 PM Call placed to: Patient Summary of Call: I called the pt to make him aware Dr. Juanda Chance had reviewed his BP readings and no changes are to be made with his medications. I have left a message on the pt's answering machine. Initial call taken by: Sherri Rad, RN, BSN,  October 18, 2009 6:30 PM

## 2010-08-21 NOTE — Letter (Signed)
Summary: Handout Printed  Printed Handout:  - Coumadin Instructions-w/out Meds 

## 2010-08-21 NOTE — Assessment & Plan Note (Signed)
Summary: f75m  Medications Added RAMIPRIL 10 MG CAPS (RAMIPRIL) Take one capsule by mouth daily      Allergies Added: NKDA  Visit Type:  Follow-up Primary Provider:  dr Kriste Basque  CC:  knee pain.  History of Present Illness: The patient is 77 years old and returns for management of nonischemic heart myopathy. He has a nonischemic heart myopathy with previous ejection fraction of 35-40%. His echo in December of 2010 showed ejection fraction of 30-35%. He had nonobstructive disease at catheterization in 2002.  He has been doing fairly well from the standpoint of his heart. He's had no chest pain shortness of breath or palpitations. His major limiting factor in his right knee and he is considering total knee replacement with Dr. Charlann Boxer.    He has been on Zestril for his cardiomyopathy but says he has symptoms of dizziness. His blood pressure was normal today and he says he takes it at home it runs in the 120 to 130 range systolic.  He has not been on a beta blocker due to bradycardia.  Current Medications (verified): 1)  Coumadin 2 Mg  Tabs (Warfarin Sodium) .... As Directed By The [facility]madin Clinic 2)  Lasix 20 Mg  Tabs (Furosemide) .Marland Kitchen.. 1 Tab By Mouth Once Daily 3)  Pravastatin Sodium 40 Mg Tabs (Pravastatin Sodium) .... Take 1 Tab By Mouth At Bedtime.Marland KitchenMarland Kitchen 4)  Lansoprazole 30 Mg Cpdr (Lansoprazole) .... Take 1 Tab By Mouth Once Daily- 30 Min Before The First Meal of The Day... 5)  Zantac 150 Mg  Tabs (Ranitidine Hcl) .... Take 1 Tablet By Mouth Once A Day 6)  Bentyl 20 Mg  Tabs (Dicyclomine Hcl) .Marland Kitchen.. 1 Tab By Mouth Three Times Daily  As Needed Abd Cramping 7)  Mobic 7.5 Mg Tabs (Meloxicam) .Marland Kitchen.. 1 By Mouth Once Daily As Needed For Arthritis 8)  Hydrocodone-Acetaminophen 5-500 Mg  Tabs (Hydrocodone-Acetaminophen) .Marland Kitchen.. 1 Tab By Mouth Q6-8h As Needed Pain (Not To Exceed 3 Per Day) 9)  Lyrica 75 Mg  Caps (Pregabalin) .Marland Kitchen.. 1 Cap By Mouth Once Daily 10)  Caltrate 600+d 600-400 Mg-Unit  Tabs (Calcium  Carbonate-Vitamin D) .... Take On Tablet By Mouth Two Times A Day 11)  Multivitamins   Tabs (Multiple Vitamin) .Marland Kitchen.. 1 Tab Daily 12)  Vitamin D-1000 [address] [phone] Mg-Unit  Tabs (Calcium-Vitamin D) .... Take 1 Tablet By Mouth Once A Day  Allergies (verified): No Known Drug Allergies  Past History:  Past Medical History: Reviewed history from 08/21/2009 and no changes required.  CORONARY ARTERY DISEASE (ICD-414.00) CARDIOMYOPATHY (ICD-425.4) LBBB (ICD-426.3) Hx of DVT (ICD-453.40) HYPERLIPIDEMIA (ICD-272.4) DM (ICD-250.00) GERD (ICD-530.81) DIVERTICULOSIS, COLON (ICD-562.10) IBS (ICD-564.1) PROSTATE CANCER, HX OF (ICD-V10.46) DEGENERATIVE JOINT DISEASE (ICD-715.90) SPINAL STENOSIS, LUMBAR (ICD-724.02) PERIPHERAL NEUROPATHY (ICD-356.9) 1. Nonischemic cardiomyopathy with ejection fraction 35-40%. 2. Nonobstructive coronary disease at catheterization 2002. 3. 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 4. Sinus bradycardia requiring discontinuation of Toprol. 5. Prostate cancer status post radiation. 6. Diabetes. 7. Hyperlipidemia with abnormal liver function tests on Zocor.   Review of Systems       ROS is negative except as outlined in HPI.   Vital Signs:  Patient profile:   77 year old male Height:      70 inches Weight:      193 pounds BMI:     27.79 Pulse rate:   61 / minute BP sitting:   130 / 70  (left arm) Cuff size:   regular  Vitals Entered By: Burnett Kanaris,  CNA (December 25, 2009 9:37 AM)  Physical Exam  Additional Exam:  Gen. Well-nourished, in no distress   Neck: No JVD, thyroid not enlarged, no carotid bruits Lungs: No tachypnea, clear without rales, rhonchi or wheezes Cardiovascular: Rhythm regular, PMI not displaced,  heart sounds  normal, no murmurs or gallops, no peripheral edema, pulses normal in all 4 extremities. Abdomen: BS normal, abdomen soft and non-tender without masses or organomegaly, no hepatosplenomegaly. MS: No  deformities, no cyanosis or clubbing   Neuro:  No focal sns   Skin:  no lesions    Impression & Recommendations:  Problem # 1:  SYSTOLIC HEART FAILURE, CHRONIC (ICD-428.22) He has a nonischemic cardiomyopathy with an ejection fraction of 30-35% by echocardiography in December 2010. He appears euvolemic today. He is limited by his knee and not by any cardiac limitation from shortness of breath. He thinks he is having some side effects from Zestril with dizziness. I'm not sure but we'll plan to switch him to REM a protime daily. He is not on a beta blocker due to the bradycardia.  he has an ejection fraction of 30-35% and a wide QRS and might be a candidate for an ICD and biventricular pacing. However he is 77 years old and doesn't have any major cardiac symptoms selecting who will plan to hold off on this. The following medications were removed from the medication list:    Lisinopril 10 Mg Tabs (Lisinopril) .Marland Kitchen... Take 1 tab by mouth once daily... His updated medication list for this problem includes:    Coumadin 2 Mg Tabs (Warfarin sodium) .Marland Kitchen... As directed by the coumadin clinic    Lasix 20 Mg Tabs (Furosemide) .Marland Kitchen... 1 tab by mouth once daily    Ramipril 10 Mg Caps (Ramipril) .Marland Kitchen... Take one capsule by mouth daily  Orders: EKG w/ Interpretation (93000) TLB-BMP (Basic Metabolic Panel-BMET) (80048-METABOL) TLB-CBC Platelet - w/Differential (85025-CBCD)  Problem # 2:  HYPERLIPIDEMIA (ICD-272.4)  This is followed by Dr. Jackquline Bosch. His updated medication list for this problem includes:    Pravastatin Sodium 40 Mg Tabs (Pravastatin sodium) .Marland Kitchen... Take 1 tab by mouth at bedtime...  His updated medication list for this problem includes:    Pravastatin Sodium 40 Mg Tabs (Pravastatin sodium) .Marland Kitchen... Take 1 tab by mouth at bedtime...  Problem # 3:  Hx of DVT (ICD-453.40) He is on chronic Coumadin therapy for this.  Patient Instructions: 1)  Your physician recommends that you have lab work today-   cbc/bmet (428.22;414.01) and in 1 week- bmet (428.22;414.01) 2)  Your physician has recommended you make the following change in your medication: 1) Start ramipril 10mg  once daily  3)  Your physician wants you to follow-up in: 6 months.  You will receive a reminder letter in the mail two months in advance. If you don't receive a letter, please call our office to schedule the follow-up appointment. Prescriptions: RAMIPRIL 10 MG CAPS (RAMIPRIL) Take one capsule by mouth daily  #30 x 11   Entered by:   Sherri Rad, RN, BSN   Authorized by:   Lenoria Farrier, MD, [facility]   Signed by:   Sherri Rad, RN, BSN on 12/25/2009   Method used:   Electronically to        [facility]  [phone]* (retail)       [address] [place], [place]  [address]       Ph: [phone] or [phone]  Fax: [phone]   RxID:   [id number]

## 2010-08-21 NOTE — Letter (Signed)
Summary: CMN for Diabetic Shoes & Inserts/[facility]  CMN for [facility] [employer]oes [employer]   Imported By: Sherian Rein 11/19/2009 09:40:53  _____________________________________________________________________  External Attachment:    Type:   Image     Comment:   External Document

## 2010-08-21 NOTE — Assessment & Plan Note (Signed)
Summary: Orthostatic BP readings  Nurse Visit   Vital Signs:  Patient profile:   77 year old male Pulse (ortho):   73 / minute BP supine:   121 / 63 BP standing:   127 / 72  Serial Vital Signs/Assessments:  Time      Position  BP       Pulse  Resp  Temp     By 1030      Lying RA  121/63   61                    Sherri Rad, RN, BSN 1030      Sitting   127/73   68                    Sherri Rad, RN, BSN 1030      Standing  127/72   73                    Sherri Rad, Charity fundraiser, BSN  Comments: 1030 standing- 2 min- 133/69 HR- 68 standing- 5 min- 131/73 HR- 70 By: Sherri Rad, RN, BSN    Visit Type:  Nurse visit Primary Provider:  dr Kriste Basque   History of Present Illness: The pt is here for orthostatic BP readings per Dr. Regino Schultze recommendation.   Current Medications (verified): 1)  Coumadin 2 Mg  Tabs (Warfarin Sodium) .... As Directed By [facility] 2)  Lisinopril 10 Mg Tabs (Lisinopril) .... Take 1 Tab By Mouth Once Daily.Marland KitchenMarland Kitchen 3)  Lasix 20 Mg  Tabs (Furosemide) .Marland Kitchen.. 1 Tab By Mouth Once Daily 4)  Pravastatin Sodium 40 Mg Tabs (Pravastatin Sodium) .... Take 1 Tab By Mouth At Bedtime.Marland KitchenMarland Kitchen 5)  Lansoprazole 30 Mg Cpdr (Lansoprazole) .... Take 1 Tab By Mouth Once Daily- 30 Min Before The First Meal of The Day... 6)  Zantac 150 Mg  Tabs (Ranitidine Hcl) .... Take 1 Tablet By Mouth Once A Day 7)  Bentyl 20 Mg  Tabs (Dicyclomine Hcl) .Marland Kitchen.. 1 Tab By Mouth Three Times Daily  As Needed Abd Cramping 8)  Promethazine Hcl 25 Mg  Tabs (Promethazine Hcl) .Marland Kitchen.. 1 Tab Q6h As Needed Nausea 9)  Mobic 7.5 Mg Tabs (Meloxicam) .Marland Kitchen.. 1 By Mouth Once Daily As Needed For Arthritis 10)  Hydrocodone-Acetaminophen 5-500 Mg  Tabs (Hydrocodone-Acetaminophen) .Marland Kitchen.. 1 Tab By Mouth Q6-8h As Needed Pain (Not To Exceed 3 Per Day) 11)  Lyrica 75 Mg  Caps (Pregabalin) .Marland Kitchen.. 1 Cap By Mouth Once Daily 12)  Caltrate 600+d 600-400 Mg-Unit  Tabs (Calcium Carbonate-Vitamin D) .... Take On Tablet By Mouth Two Times  A Day 13)  Multivitamins   Tabs (Multiple Vitamin) .Marland Kitchen.. 1 Tab Daily 14)  Vitamin D-1000 Max St [phone] Mg-Unit  Tabs (Calcium-Vitamin D) .... Take 1 Tablet By Mouth Once A Day 15)  Prednisone 5 Mg Tabs (Prednisone) .... Take 1 Tab By Mouth Once Daily...  Allergies (verified): No Known Drug Allergies

## 2010-08-21 NOTE — Medication Information (Signed)
Summary: [facility] coumadin - [facility]   Anticoagulant Therapy  Managed by: Weston Brass, PharmD Referring MD: Charlies Constable MD PCP: dr Merry Proud MD: Shirlee Latch MD, Dalton Indication 1: Deep Vein Thrombosis - Leg (ICD-451.1) Lab Used: [facility] Site: [facility] INR POC 3.2 INR RANGE 2 - 3  Dietary changes: no    Health status changes: no    Bleeding/hemorrhagic complications: no    Recent/future hospitalizations: no    Any changes in medication regimen? no    Recent/future dental: no  Any missed doses?: no       Is patient compliant with meds? yes       Allergies: No Known Drug Allergies  Anticoagulation Management History:      The patient is taking warfarin and comes in today for a routine follow up visit.  Positive risk factors for bleeding include an age of 10 years or older and presence of serious comorbidities.  The bleeding index is 'intermediate risk'.  Positive CHADS2 values include History of CHF, History of HTN, Age > 77 years old, and History of Diabetes.  The start date was 07/31/2000.  Anticoagulation responsible provider: Shirlee Latch MD, [place].  INR POC: 3.2.  Cuvette Lot#: [id number].  Exp: 08/2011.    Anticoagulation Management Assessment/Plan:      The patient's current anticoagulation dose is Coumadin 2 mg  tabs: as directed by the coumadin clinic.  The target INR is 2.0-3.0.  The next INR is due 08/06/2010.  Anticoagulation instructions were given to patient.  Results were reviewed/authorized by Weston Brass, PharmD.  He was notified by Weston Brass PharmD.         Prior Anticoagulation Instructions: INR 2.2  Coumadin 2mg  tabs take 2 tabs each day except 1 tab on Fri  Current Anticoagulation Instructions: INR 3.2  Take 1 tablet today then resume same dose of 2 tablets every day except 1 tablet on Friday.  Recheck INR in 4 weeks.

## 2010-08-21 NOTE — Progress Notes (Signed)
Summary: coumadin and mobic refills  Phone Note Call from Patient Call back at Home Phone [phone]   Caller: Patient Call For: nadel Reason for Call: Refill Medication Summary of Call: Need written rx for coumadin 2mg  (200 tabs for 3 months) and mobic 7.5mg  (90 tabs for 3 months). Will pick up. Initial call taken by: Darletta Moll,  May 13, 2010 8:43 AM  Follow-up for Phone Call        called spoke with patient to verifiy meds needed and dosages.  pt states he takes his refills to [facility] and needs the rx's to mail off as he will be at [facility] on Tuesday.  please advise if rx's may be printed off, thanks!  last ov 8.3.11, next 2.1.12.  mobic last written 5.6.11 #90 w/ 3 refills. Boone Master CNA/MA  May 13, 2010 9:28 AM   Additional Follow-up for Phone Call Additional follow up Details #1::        lmom to make pt aware of rx ready to be picked up ---left these up front for pt to pick up. Randell Loop CMA  May 13, 2010 12:17 PM     Prescriptions: MOBIC 7.5 MG TABS (MELOXICAM) 1 by mouth once daily as needed FOR ARTHRITIS  #90 x 3   Entered by:   Randell Loop CMA   Authorized by:   Michele Mcalpine MD   Signed by:   Randell Loop CMA on 05/13/2010   Method used:   Print then Give to Patient   RxID:   [id number] COUMADIN 2 MG  TABS (WARFARIN SODIUM) as directed by the coumadin clinic  #200 x 4   Entered by:   Randell Loop CMA   Authorized by:   Michele Mcalpine MD   Signed by:   Randell Loop CMA on 05/13/2010   Method used:   Print then Give to Patient   RxID:   [id number]

## 2010-08-21 NOTE — Assessment & Plan Note (Signed)
Summary: 6 months/ mbw   Primary Care Provider:  dr Kriste Basque  CC:  6 month ROV & review of mult medical problems....  History of Present Illness: 77 y/o WM w/ mult med problems here for a follow up OV...     ~  August 21, 2009:  he tells me he was planning right TKR by DrOlin & had pre-op eval DrBBrodie w/ 2DEcho- unchanged nonischemic cardiomyopathy & pt indicates that they advised against surg... he notes Pred helped his knee pain & we discussed trial of 5mg /d to see if it makes life more bearable... today he requests 120d refills for his meds...   ~  February 19, 2010:  Overall stable w/ CC= his arthritis/ DJD> pain in knees & neck... he saw DrBBrodie 6/11> stable but c/o dizzy on Lisinopril, therefore changed to Ramipril but his head felt funy, so now on  LOSARTAN, not on BBlocker due to brady... BS controlled on diet + exercise... Chol OK on Prav40... PSA remains "zero" s/p seed implants... he gets all his meds filled at [facility].    1.  Non-obstructive CAD, Cardiomyopathy, LBBB - on LOSARTAN 50mg /d,  LASIX 20mg /d... followed by DrBBrodie for Cards... his prev TOPROL was stopped secondary to bradycardia, and he has prev refused to take Vasotec, c/o dizzy from Lisinopril, & head felt funny on Altace.  ~  baseline EKG= NSR, LBBB...  ~  2DEcho 11/08 showed global HK, dil LV, EF=35-40%, mild AR/ MR...   ~  labs 6/09 showed BUN=20, Creat= 1.1, K= 4.8, BNP= 57...  ~  2DEcho 5/10 showed LV mod dil w/ diffuse HK & EF= 30-35%, mild MR & AI, dil atria & incr PAsys=48.  ~  Pre-op eval 12/10 by BB w/ repeat echo (no change)- pt states he advised against the ortho surg.  2.  Hx DVT - he continues on Coumadin and followed in the coumadin clinic...  3.  DM - he's on diet alone... his BS at home has averaged 120 by his report...  ~  labs 2/08 showed BS= 128, HgA1c = 6.3  ~  labs 6/09 showed BS= 118, HgA1c= 6.3  ~  he saw DrGroat 2/10 for eye check- no retinopathy...  ~  labs in 2010 showed BS's 120-190  ~   labs 8/10 showed BS= 109, A1c= 6.3  ~  labs 6/11 showed BS= 193  ~  labs 8/11 showed BS= 112, A1c=6.7  4.  Chol - he's been taking PRAVASTATIN 40 and tolerating this well... he prev had trouble w/ Zocor w/ increased LFTs...   ~  FLP 5/08 showed TChol 212, TG 77, HDL 75, LDL 108  ~  FLP 6/09 showed TChol 180, TG 80, HDL 54, LDL 110  ~  FLP 2/10 showed TChol 226, TG 43, HDL 65, LDL 148... he declined to incr the Prav to 80.  ~  FLP 8/10 showed TChol 187, TG 87, HDL 52, LDL 87  ~  FLP 8/11 showed TChol 176, TG 87, HDL 56, LDL 102  5.  DJD - he had a Left TKR by DrOlin 8/08... he went to the [facility] post op to recoup, then back home... after that his wife fell and broke her hip...  now having incr difficulty w/ right knee and had shot from ortho- last Jan10... he takes VICODIN Tid Prn,  MOBIC 7.5mg  Prn, & LYRICA 75mg /d... along w/ Calcium, MVI, & Vit D...  ~  DrOlin planned right TKR 1/11- pre-op eval from Cards &  pt reports they advised against surg.  6. GERD &  IBS - on PREVACID 30mg /d, & Zantac 150mg HS, Bentyl 20mg Tid Prn which really helps his IBS symptoms... + Phenergan 25mg Prn nausea...   ~  2/10:  c/o incr reflux & peptic symptoms... advised start PREVACID 30mg /d in AM & take Zantac HS...   7.  PROSTATE CANCER - followed by DrPeterson/ [facility] & treated w/ radium seeds in 2000...  ~  labs 2/10 showed PSA= 0.02  ~  labs 8/11 showed PSA= 0.02  8.  SHINGLES? - he states that he "caught" shingles from a guy he was sitting next to at a restaurant & broke out on his leg 2-3d later... he went to see Derm= DrLeschin & treated...   Preventive Screening-Counseling & Management  Alcohol-Tobacco     Smoking Status: quit     Packs/Day: 1.0     Year Quit: 1991  Allergies (verified): No Known Drug Allergies  Comments:  Nurse/Medical Assistant: The patient's medications and allergies were reviewed with the patient and were updated in the Medication and Allergy Lists.  Past  History:  Past Medical History: CORONARY ARTERY DISEASE (ICD-414.00) CARDIOMYOPATHY (ICD-425.4) LBBB (ICD-426.3) Hx of DVT (ICD-453.40) HYPERLIPIDEMIA (ICD-272.4) DM (ICD-250.00) GERD (ICD-530.81) DIVERTICULOSIS, COLON (ICD-562.10) IBS (ICD-564.1) PROSTATE CANCER, HX OF (ICD-V10.46) DEGENERATIVE JOINT DISEASE (ICD-715.90) SPINAL STENOSIS, LUMBAR (ICD-724.02) PERIPHERAL NEUROPATHY (ICD-356.9)  1. Nonischemic cardiomyopathy with ejection fraction 35-40%. 2. Nonobstructive coronary disease at catheterization 2002. 3. 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 4. Sinus bradycardia requiring discontinuation of Toprol. 5. Prostate cancer status post radiation. 6. Diabetes. 7. Hyperlipidemia with abnormal liver function tests on Zocor.   Past Surgical History: Appendectomy Cholecystectomy - 1985 by DrAbrams L Knee Arthroscopy - 1991 by DrKrege ERCP w/ Sphincterotomy and Stone Removal - 5/93 by DrStark Bilat Inguinal Hernia Repairs - 1998 by DrPrice S/P Radium Seed Implantation for Prostate Ca - 1/00 by DrPeterson & Goodchild R Cataract Surgery - 2004 by DrHecker S/P Repair of Recurrent R Inguinal Hernia - 12/07 by Rosalio Macadamia Basal Cell Cancer L Ear - S/P Moh's Surg 3/08 DrLeshin L Total Knee Replacement - 8/08 by DrOlin  Family History: Reviewed history from 11/14/2008 and no changes required.  Brother with throat cancer  Father deceased with coronary artery disease.  Social History: Reviewed history from 11/14/2008 and no changes required. Married, no children Alcohol Use - no Packs/Day:  1.0  Review of Systems      See HPI       The patient complains of dyspnea on exertion.  The patient denies anorexia, fever, weight loss, weight gain, vision loss, decreased hearing, hoarseness, chest pain, syncope, peripheral edema, prolonged cough, headaches, hemoptysis, abdominal pain, melena, hematochezia, severe indigestion/heartburn, hematuria, incontinence, muscle  weakness, suspicious skin lesions, transient blindness, difficulty walking, depression, unusual weight change, abnormal bleeding, enlarged lymph nodes, and angioedema.    Vital Signs:  Patient profile:   77 year old male Height:      70 inches Weight:      196.50 pounds BMI:     28.30 O2 Sat:      95 % on Room air Temp:     97.5 degrees F oral Pulse rate:   63 / minute BP sitting:   120 / 60  (left arm) Cuff size:   regular  Vitals Entered By: Randell Loop CMA (February 19, 2010 10:31 AM)  O2 Sat at Rest %:  95 O2 Flow:  Room air CC: 6 month ROV & review of mult  medical problems... Is Patient Diabetic? No Pain Assessment Patient in pain? no      Comments no changes in meds today    Physical Exam  Additional Exam:  WD, WN, 84 WM in NAD... GENERAL:  Alert & oriented; pleasant & cooperative... HEENT:  [place]/AT, EOM-wnl, PERRLA, EACs-clear, TMs-wnl, NOSE-clear, THROAT-clear & wnl. NECK:  Supple w/ fair ROM; no JVD; normal carotid impulses w/o bruits; no thyromegaly or nodules palpated; no lymphadenopathy. CHEST:  Clear to P & A; without wheezes/ rales/ or rhonchi. HEART:  Regular Rhythm; without murmurs/ rubs/ or gallops. ABDOMEN:  Soft & nontender; normal bowel sounds; no organomegaly or masses detected. EXT:  s/p left TKR, mod-severe arthritic changes; mild venous insuffic changes, no signif edema. NEURO:  CN's intact; motor testing normal; sensory testing equivocal;  gait abnormal, balance fair. DERM:  No lesions noted; no rash etc...    MISC. Report  Procedure date:  02/19/2010  Findings:      BMP (METABOL)   Sodium                    141 mEq/L                   135-145   Potassium                 4.9 mEq/L                   3.5-5.1   Chloride                  105 mEq/L                   96-112   Carbon Dioxide            27 mEq/L                    19-32   Glucose              [H]  112 mg/dL                   16-10   BUN                  [H]  25 mg/dL                     9-60   Creatinine                1.0 mg/dL                   4.5-4.0   Calcium                   9.7 mg/dL                   9.8-11.9   GFR                       72.17 mL/min                >60  Lipid Panel (LIPID)   Cholesterol               176 mg/dL                   1-478   Triglycerides             87.0 mg/dL  0.0-149.0   HDL                       62.95 mg/dL                 >28.41   LDL Cholesterol      [H]  324 mg/dL                   4-01  Hepatic/Liver Function Panel (HEPATIC)   Total Bilirubin           0.6 mg/dL                   0.2-7.2   Direct Bilirubin          0.1 mg/dL                   5.3-6.6   Alkaline Phosphatase      40 U/L                      39-117   AST                       19 U/L                      0-37   ALT                       13 U/L                      0-53   Total Protein             6.6 g/dL                    4.4-0.3   Albumin                   4.1 g/dL                    4.7-4.2  Comments:      CBC Platelet w/Diff (CBCD)   White Cell Count     [L]  4.3 K/uL                    4.5-10.5   Red Cell Count       [L]  3.79 Mil/uL                 4.22-5.81   Hemoglobin           [L]  12.4 g/dL                   59.5-63.8   Hematocrit           [L]  35.9 %                      39.0-52.0   MCV                       94.6 fl                     78.0-100.0   Platelet Count            182.0 K/uL                  150.0-400.0   Neutrophil %  50.9 %                      43.0-77.0   Lymphocyte %              38.8 %                      12.0-46.0   Monocyte %                6.6 %                       3.0-12.0   Eosinophils%              2.9 %                       0.0-5.0   Basophils %               0.8 %                       0.0-3.0  TSH (TSH)   FastTSH                   1.11 uIU/mL                 0.35-5.50  Prostate Specific Antigen (PSA)   PSA-Hyb              [L]  0.02 ng/mL                  0.10-4.00  Hemoglobin A1C  (A1C)   Hemoglobin A1C       [H]  6.7 %                       4.6-6.5   Impression & Recommendations:  Problem # 1:  CARDIAC>>> Followed by DrBBrodie & seen 6/11>  stable, changed to LOSARTAN due to his perceived side effects...  Problem # 2:  Hx of DVT (ICD-453.40) On Coumadin in the CC...  Problem # 3:  HYPERLIPIDEMIA (ICD-272.4) Stable on Prav40... he won't take any med that the [facility] won't [facility] for him... His updated medication list for this problem includes:    Pravastatin Sodium 40 Mg Tabs (Pravastatin sodium) .Marland Kitchen... Take 1 tab by mouth at bedtime...  Problem # 4:  DM (ICD-250.00) Stable on diet alone... His updated medication list for this problem includes:    Losartan Potassium 50 Mg Tabs (Losartan potassium) .Marland Kitchen... Take one tab by mouth once daily  Problem # 5:  GI>>> Stable on meds... continue same.  Problem # 6:  PROSTATE CANCER, HX OF (ICD-V10.46) Followed by DrPeterson... PSA still <0.02 after seeds placed 2000...  Problem # 7:  DEGENERATIVE JOINT DISEASE (ICD-715.90) Stable on meds... continue same. His updated medication list for this problem includes:    Mobic 7.5 Mg Tabs (Meloxicam) .Marland Kitchen... 1 by mouth once daily as needed for arthritis    Hydrocodone-acetaminophen 5-500 Mg Tabs (Hydrocodone-acetaminophen) .Marland Kitchen... 1 tab by mouth q6-8h as needed pain (not to exceed 3 per day)  Problem # 8:  OTHER MEDICAL PROBLEMS AS NOTED>>>  Complete Medication List: 1)  Coumadin 2 Mg Tabs (Warfarin sodium) .... As directed by the coumadin clinic 2)  Losartan Potassium 50 Mg Tabs (Losartan potassium) .... Take one tab by mouth once daily 3)  Lasix 20 Mg Tabs (Furosemide) .Marland Kitchen.. 1 tab by mouth  once daily 4)  Pravastatin Sodium 40 Mg Tabs (Pravastatin sodium) .... Take 1 tab by mouth at bedtime.Marland KitchenMarland Kitchen 5)  Lansoprazole 30 Mg Cpdr (Lansoprazole) .... Take 1 tab by mouth once daily- 30 min before the first meal of the day... 6)  Zantac 150 Mg Tabs (Ranitidine hcl) .... Take 1 tablet by mouth  once a day 7)  Bentyl 20 Mg Tabs (Dicyclomine hcl) .Marland Kitchen.. 1 tab by mouth three times daily as needed abd cramping... 8)  Mobic 7.5 Mg Tabs (Meloxicam) .Marland Kitchen.. 1 by mouth once daily as needed for arthritis 9)  Hydrocodone-acetaminophen 5-500 Mg Tabs (Hydrocodone-acetaminophen) .Marland Kitchen.. 1 tab by mouth q6-8h as needed pain (not to exceed 3 per day) 10)  Lyrica 75 Mg Caps (Pregabalin) .Marland Kitchen.. 1 cap by mouth once daily 11)  Caltrate 600+d 600-400 Mg-unit Tabs (Calcium carbonate-vitamin d) .... Take on tablet by mouth two times a day 12)  Multivitamins Tabs (Multiple vitamin) .Marland Kitchen.. 1 tab daily 13)  Vitamin D-1000 [address] [phone] Mg-unit Tabs (Calcium-vitamin d) .... Take 1 tablet by mouth once a day  Patient Instructions: 1)  Today we updated your med list- see below.... 2)  We refilled the meds you requested... 3)  Please return to our lab one morning this week for your FASTING blood work... please call the "phone tree" in a few days for your lab results.Marland KitchenMarland Kitchen 4)  Call for any questions.Marland KitchenMarland Kitchen 5)  Please schedule a follow-up appointment in 6 months. Prescriptions: BENTYL 20 MG  TABS (DICYCLOMINE HCL) 1 tab by mouth three times daily as needed abd cramping...  #270 x 4   Entered and Authorized by:   Michele Mcalpine MD   Signed by:   Michele Mcalpine MD on 02/19/2010   Method used:   Print then Give to Patient   RxID:   [id number] LANSOPRAZOLE 30 MG CPDR (LANSOPRAZOLE) take 1 tab by mouth once daily- 30 min before the first meal of the day...  #90 x 4   Entered and Authorized by:   Michele Mcalpine MD   Signed by:   Michele Mcalpine MD on 02/19/2010   Method used:   Print then Give to Patient   RxID:   [id number] VITAMIN D-1000 MAX ST [phone] MG-UNIT  TABS (CALCIUM-VITAMIN D) Take 1 tablet by mouth once a day  #90 x 4   Entered and Authorized by:   Michele Mcalpine MD   Signed by:   Michele Mcalpine MD on 02/19/2010   Method used:   Print then Give to Patient   RxID:   [id number] CALTRATE 600+D 600-400 MG-UNIT   TABS (CALCIUM CARBONATE-VITAMIN D) take on tablet by mouth two times a day  #180 x 4   Entered and Authorized by:   Michele Mcalpine MD   Signed by:   Michele Mcalpine MD on 02/19/2010   Method used:   Print then Give to Patient   RxID:   [id number]    Immunization History:  Influenza Immunization History:    Influenza:  historical (06/04/2009)

## 2010-08-21 NOTE — Medication Information (Signed)
Summary: [name] [name]  Anticoagulant Therapy  Managed by: Weston Brass, PharmD Referring MD: Charlies Constable MD PCP: dr Merry Proud MD: Johney Frame MD, Fayrene Fearing Indication 1: Deep Vein Thrombosis - Leg (ICD-451.1) Lab Used: [facility] Site: [facility] INR POC 1.5 INR RANGE 2 - 3  Dietary changes: no    Health status changes: no    Bleeding/hemorrhagic complications: no    Recent/future hospitalizations: no    Any changes in medication regimen? no    Recent/future dental: no  Any missed doses?: no       Is patient compliant with meds? yes       Allergies: No Known Drug Allergies  Anticoagulation Management History:      The patient is taking warfarin and comes in today for a routine follow up visit.  Positive risk factors for bleeding include an age of 75 years or older and presence of serious comorbidities.  The bleeding index is 'intermediate risk'.  Positive CHADS2 values include History of CHF, Age > 2 years old, and History of Diabetes.  The start date was 07/31/2000.  Anticoagulation responsible provider: Yarenis Cerino MD, Fayrene Fearing.  INR POC: 1.5.  Cuvette Lot#: [id number].  Exp: 02/2011.    Anticoagulation Management Assessment/Plan:      The patient's current anticoagulation dose is Coumadin 2 mg  tabs: as directed by the coumadin clinic.  The target INR is 2.0-3.0.  The next INR is due 12/25/2009.  Anticoagulation instructions were given to patient.  Results were reviewed/authorized by Weston Brass, PharmD.  He was notified by Weston Brass PharmD.         Prior Anticoagulation Instructions: INR 2.1  Continue on same dosage 2 tablets daily except 1 tablet on Mondays and Fridays.  Recheck in 4 weeks.    Current Anticoagulation Instructions: INR 1.5  Take 3 tablets today and tomorrow then resume same dose of 2 tablets every day except 1 tablet on Monday and Friday

## 2010-08-21 NOTE — Letter (Signed)
Summary: [facility] Surgical Clearance  [facility] Clearance   Imported By: Roderic Ovens 08/19/2009 16:00:44  _____________________________________________________________________  External Attachment:    Type:   Image     Comment:   External Document

## 2010-08-21 NOTE — Assessment & Plan Note (Signed)
Summary: f28m   Visit Type:  Follow-up Primary Provider:  dr Kriste Basque  CC:  ROV.  History of Present Illness: The patient is 77 years old and returns for management of nonischemic heart myopathy. He has a nonischemic heart myopathy with previous ejection fraction of 35-40%. His echo in December of 2010 showed ejection fraction of 30-35%. He had nonobstructive disease at catheterization in 2002.  He has been doing fairly well from the standpoint of his heart. He's had no chest pain shortness of breath or palpitations.  We started ramapril last time but he developed dizziness and we switched him to losartin.  He was considering TKR but now thinks he will wait.      He has not been on a beta blocker due to bradycardia.  Current Medications (verified): 1)  Coumadin 2 Mg  Tabs (Warfarin Sodium) .... As Directed By The Coumadin Clinic 2)  Losartan Potassium 50 Mg Tabs (Losartan Potassium) .... Take One Tab By Mouth Once Daily 3)  Lasix 20 Mg  Tabs (Furosemide) .Marland Kitchen.. 1 Tab By Mouth Once Daily 4)  Pravastatin Sodium 40 Mg Tabs (Pravastatin Sodium) .... Take 1 Tab By Mouth At Bedtime.Marland KitchenMarland Kitchen 5)  Nexium 40 Mg Cpdr (Esomeprazole Magnesium) .... Take One Tablet By Mouth 30 Mins Prior To The First Meal of The Day 6)  Zantac 150 Mg  Tabs (Ranitidine Hcl) .... Take 1 Tablet By Mouth Once A Day 7)  Bentyl 20 Mg  Tabs (Dicyclomine Hcl) .Marland Kitchen.. 1 Tab By Mouth Three Times Daily As Needed Abd Cramping... 8)  Mobic 7.5 Mg Tabs (Meloxicam) .Marland Kitchen.. 1 By Mouth Once Daily As Needed For Arthritis 9)  Hydrocodone-Acetaminophen 5-500 Mg  Tabs (Hydrocodone-Acetaminophen) .Marland Kitchen.. 1 Tab By Mouth Q6-8h As Needed Pain (Not To Exceed 3 Per Day) 10)  Lyrica 75 Mg  Caps (Pregabalin) .Marland Kitchen.. 1 Cap By Mouth Once Daily 11)  Caltrate 600+d 600-400 Mg-Unit  Tabs (Calcium Carbonate-Vitamin D) .... Take On Tablet By Mouth Two Times A Day 12)  Multivitamins   Tabs (Multiple Vitamin) .Marland Kitchen.. 1 Tab Daily 13)  Vitamin D-1000 Max St [phone] Mg-Unit   Tabs (Calcium-Vitamin D) .... Take 1 Tablet By Mouth Once A Day  Allergies: No Known Drug Allergies  Past History:  Past Surgical History: Last updated: 02/19/2010 Appendectomy Cholecystectomy - 1985 by DrAbrams L Knee Arthroscopy - 1991 by DrKrege ERCP w/ Sphincterotomy and Stone Removal - 5/93 by DrStark Bilat Inguinal Hernia Repairs - 1998 by DrPrice S/P Radium Seed Implantation for Prostate Ca - 1/00 by DrPeterson & Goodchild R Cataract Surgery - 2004 by DrHecker S/P Repair of Recurrent R Inguinal Hernia - 12/07 by Rosalio Macadamia Basal Cell Cancer L Ear - S/P Moh's Surg 3/08 DrLeshin L Total Knee Replacement - 8/08 by DrOlin  Family History: Last updated: 02/19/2010  Brother with throat cancer  Father deceased with coronary artery disease.  Social History: Last updated: 11/14/2008 Married, no children Alcohol Use - no  Risk Factors: Smoking Status: quit (02/19/2010) Packs/Day: 1.0 (02/19/2010)  Past Medical History: Reviewed history from 02/19/2010 and no changes required. CORONARY ARTERY DISEASE (ICD-414.00) CARDIOMYOPATHY (ICD-425.4) LBBB (ICD-426.3) Hx of DVT (ICD-453.40) HYPERLIPIDEMIA (ICD-272.4) DM (ICD-250.00) GERD (ICD-530.81) DIVERTICULOSIS, COLON (ICD-562.10) IBS (ICD-564.1) PROSTATE CANCER, HX OF (ICD-V10.46) DEGENERATIVE JOINT DISEASE (ICD-715.90) SPINAL STENOSIS, LUMBAR (ICD-724.02) PERIPHERAL NEUROPATHY (ICD-356.9)  1. Nonischemic cardiomyopathy with ejection fraction 35-40%. 2. Nonobstructive coronary disease at catheterization 2002. 3. 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 4. Sinus bradycardia requiring discontinuation of Toprol. 5. Prostate cancer status post  radiation. 6. Diabetes. 7. Hyperlipidemia with abnormal liver function tests on Zocor.   Review of Systems       ROS is negative except as outlined in HPI.   Vital Signs:  Patient profile:   77 year old male Height:      70 inches Weight:      191 pounds Pulse  rate:   60 / minute BP sitting:   140 / 62  (left arm) Cuff size:   regular  Vitals Entered By: Stanton Kidney, EMT-P (June 23, 2010 2:26 PM)  Physical Exam  Additional Exam:  Gen. Well-nourished, in no distress   Neck: No JVD, thyroid not enlarged, no carotid bruits Lungs: No tachypnea, clear without rales, rhonchi or wheezes Cardiovascular: Rhythm regular, PMI not displaced,  heart sounds  normal, no murmurs or gallops, no peripheral edema, pulses normal in all 4 extremities. Abdomen: BS normal, abdomen soft and non-tender without masses or organomegaly, no hepatosplenomegaly. MS: No deformities, no cyanosis or clubbing   Neuro:  No focal sns   Skin:  no lesions    Impression & Recommendations:  Problem # 1:  SYSTOLIC HEART FAILURE, CHRONIC (ICD-428.22) He has NICM with EF 30-35% 12/10.  Euvolemic today.  Stable. The following medications were removed from the medication list:    Losartan Potassium 50 Mg Tabs (Losartan potassium) ..... Once daily His updated medication list for this problem includes:    Coumadin 2 Mg Tabs (Warfarin sodium) .Marland Kitchen... As directed by the coumadin clinic    Losartan Potassium 50 Mg Tabs (Losartan potassium) .Marland Kitchen... Take one tab by mouth once daily    Lasix 20 Mg Tabs (Furosemide) .Marland Kitchen... 1 tab by mouth once daily  Problem # 2:  Hx of DVT (ICD-453.40) He is on chronic coumadin for this.  Problem # 3:  HYPERTENSION, BENIGN (ICD-401.1) Controlled on current meds. The following medications were removed from the medication list:    Losartan Potassium 50 Mg Tabs (Losartan potassium) ..... Once daily His updated medication list for this problem includes:    Losartan Potassium 50 Mg Tabs (Losartan potassium) .Marland Kitchen... Take one tab by mouth once daily    Lasix 20 Mg Tabs (Furosemide) .Marland Kitchen... 1 tab by mouth once daily  Other Orders: EKG w/ Interpretation (93000)  Patient Instructions: 1)  Your physician recommends that you continue on your current medications as  directed. Please refer to the Current Medication list given to you today. 2)  Your physician wants you to follow-up in:  6 months with Dr. Shirlee Latch. You will receive a reminder letter in the mail two months in advance. If you don't receive a letter, please call our office to schedule the follow-up appointment.

## 2010-08-21 NOTE — Medication Information (Signed)
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 no    Recent/future dental: no  Any missed doses?: no       Is patient compliant with meds? yes       Allergies: No Known Drug Allergies  Anticoagulation Management History:      The patient is taking warfarin and comes in today for a routine follow up visit.  Positive risk factors for bleeding include an age of 75 years or older and presence of serious comorbidities.  The bleeding index is 'intermediate risk'.  Positive CHADS2 values include History of CHF, Age > 19 years old, and History of Diabetes.  The start date was 07/31/2000.  Anticoagulation responsible Anthonyjames Bargar: Eden Emms MD, Theron Arista.  INR POC: 2.5.  Cuvette Lot#: [id number].  Exp: 04/2011.    Anticoagulation Management Assessment/Plan:      The patient's current anticoagulation dose is Coumadin 2 mg  tabs: as directed by the coumadin clinic.  The target INR is 2.0-3.0.  The next INR is due 04/16/2010.  Anticoagulation instructions were given to patient.  Results were reviewed/authorized by Reina Fuse, PharmD.  He was notified by Reina Fuse, PharmD.         Prior Anticoagulation Instructions: INR 2.5 Continue 4mg s everyday except 2mg s on Fridays. Recheck in 4 weeks.   Current Anticoagulation Instructions: INR 2.5  Continue taking Coumadin 2 tabs (4 mg) all days except Coumadin 1 tab (2 mg) on Friday. Return to clinic in 4 weeks.

## 2010-08-21 NOTE — Medication Information (Signed)
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 no    Recent/future dental: no  Any missed doses?: no       Is patient compliant with meds? yes       Allergies: No Known Drug Allergies  Anticoagulation Management History:      The patient is taking warfarin and comes in today for a routine follow up visit.  Positive risk factors for bleeding include an age of 75 years or older and presence of serious comorbidities.  The bleeding index is 'intermediate risk'.  Positive CHADS2 values include History of CHF, Age > 75 years old, and History of Diabetes.  The start date was 07/31/2000.  Anticoagulation responsible provider: Bensimhon MD, Reuel Boom.  INR POC: 2.4.  Cuvette Lot#: [id number].  Exp: 10/2010.    Anticoagulation Management Assessment/Plan:      The patient's current anticoagulation dose is Coumadin 2 mg  tabs: as directed by the coumadin clinic.  The target INR is 2.0-3.0.  The next INR is due 09/25/2009.  Anticoagulation instructions were given to patient.  Results were reviewed/authorized by Bethena Midget, RN, BSN.  He was notified by Bethena Midget, RN, BSN.         Prior Anticoagulation Instructions: INR 3.7 Skip today's dose and Tuesdays then change dose to 2 tablets everyday except on Mondays and Fridays take 1 tablet. Recheck in 2 weeks.   Current Anticoagulation Instructions: INR 2.4 Continue 4mg s everyday except 2mg s on Mondays and Fridays. Recheck in 2 weeks.

## 2010-08-21 NOTE — Progress Notes (Signed)
Summary: Ramipril #90   Phone Note Outgoing Call   Call placed by: Sherri Rad, RN, BSN,  April 01, 2010 5:09 PM Call placed to: [facility] Summary of Call: I received a faxed "rejection message" stating that the pt's insurance plan requires a 90-day supply on his ramipril. I called the pharmacy and ok'ed ramipril 10mg  once daily #90 w/ 3 rf's.  Initial call taken by: Sherri Rad, RN, BSN,  April 01, 2010 5:10 PM

## 2010-08-21 NOTE — Medication Information (Signed)
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 no    Recent/future dental: no  Any missed doses?: no       Is patient compliant with meds? yes      Comments: Pt has been noticing dizzy spells, especially upon rising, which resolve once he is up moving around.  Pt is on only lisinopril 10 mg daily.  He reports that he does not drink much throughout the day, which I have advised him to do more of.  I have also advised the pt to take caution when rising from lying down or sitting.  I will flag Dr. Juanda Chance concerning this issue as well.  Patien's blood pressure today was 122/62.  Current Medications (verified): 1)  Coumadin 2 Mg  Tabs (Warfarin Sodium) .... As Directed By The [facility]madin Clinic 2)  Lisinopril 10 Mg Tabs (Lisinopril) .... Take 1 Tab By Mouth Once Daily.Marland KitchenMarland Kitchen 3)  Lasix 20 Mg  Tabs (Furosemide) .Marland Kitchen.. 1 Tab By Mouth Once Daily 4)  Pravastatin Sodium 40 Mg Tabs (Pravastatin Sodium) .... Take 1 Tab By Mouth At Bedtime.Marland KitchenMarland Kitchen 5)  Lansoprazole 30 Mg Cpdr (Lansoprazole) .... Take 1 Tab By Mouth Once Daily- 30 Min Before The First Meal of The Day... 6)  Zantac 150 Mg  Tabs (Ranitidine Hcl) .... Take 1 Tablet By Mouth Once A Day 7)  Bentyl 20 Mg  Tabs (Dicyclomine Hcl) .Marland Kitchen.. 1 Tab By Mouth Three Times Daily  As Needed Abd Cramping 8)  Promethazine Hcl 25 Mg  Tabs (Promethazine Hcl) .Marland Kitchen.. 1 Tab Q6h As Needed Nausea 9)  Mobic 7.5 Mg Tabs (Meloxicam) .Marland Kitchen.. 1 By Mouth Once Daily As Needed For Arthritis 10)  Hydrocodone-Acetaminophen 5-500 Mg  Tabs (Hydrocodone-Acetaminophen) .Marland Kitchen.. 1 Tab By Mouth Q6-8h As  Needed Pain (Not To Exceed 3 Per Day) 11)  Lyrica 75 Mg  Caps (Pregabalin) .Marland Kitchen.. 1 Cap By Mouth Once Daily 12)  Caltrate 600+d 600-400 Mg-Unit  Tabs (Calcium Carbonate-Vitamin D) .... Take On Tablet By Mouth Two Times A Day 13)  Multivitamins   Tabs (Multiple Vitamin) .Marland Kitchen.. 1 Tab Daily 14)  Vitamin D-1000 Max St [phone] Mg-Unit  Tabs (Calcium-Vitamin D) .... Take 1 Tablet By Mouth Once A Day 15)  Prednisone 5 Mg Tabs (Prednisone) .... Take 1 Tab By Mouth Once Daily...  Allergies (verified): No Known Drug Allergies  Anticoagulation Management History:      The patient is taking warfarin and comes in today for a routine follow up visit.  Positive risk factors for bleeding include an age of 14 years or older and presence of serious comorbidities.  The bleeding index is 'intermediate risk'.  Positive CHADS2 values include History of CHF, Age > 63 years old, and History of Diabetes.  The start date was 07/31/2000.  Anticoagulation responsible provider: Eden Emms MD, Theron Arista.  INR POC: 1.9.  Cuvette Lot#: [id number].  Exp: 11/2010.    Anticoagulation Management Assessment/Plan:      The patient's current anticoagulation dose is Coumadin 2 mg  tabs: as directed by the coumadin clinic.  The target INR is 2.0-3.0.  The next INR is  due 10/16/2009.  Anticoagulation instructions were given to patient.  Results were reviewed/authorized by Eda Keys, PharmD.  He was notified by Eda Keys.         Prior Anticoagulation Instructions: INR 2.4 Continue 4mg s everyday except 2mg s on Mondays and Fridays. Recheck in 2 weeks.   Current Anticoagulation Instructions: INR 1.9  Take 3 tablets today.  Then return to normal dosing schedule of 2 mg on Monday and Friday and 4 mg all other days.  Return to clinic in 3 weeks.

## 2010-08-21 NOTE — Medication Information (Signed)
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 no    Recent/future dental: no  Any missed doses?: no       Is patient compliant with meds? yes       Allergies: No Known Drug Allergies  Anticoagulation Management History:      The patient is taking warfarin and comes in today for a routine follow up visit.  Positive risk factors for bleeding include an age of 75 years or older and presence of serious comorbidities.  The bleeding index is 'intermediate risk'.  Positive CHADS2 values include History of CHF, Age > 1 years old, and History of Diabetes.  The start date was 07/31/2000.  Anticoagulation responsible [place]: Ladona Ridgel MD, Sharlot Gowda.  INR POC: 2.3.  Cuvette Lot#: [id number].  Exp: 05/2011.    Anticoagulation Management Assessment/Plan:      The patient's current anticoagulation dose is Coumadin 2 mg  tabs: as directed by the coumadin clinic.  The target INR is 2.0-3.0.  The next INR is due 06/11/2010.  Anticoagulation instructions were given to patient.  Results were reviewed/authorized by Weston Brass, PharmD.  He was notified by Ilean Skill D candidate.         Prior Anticoagulation Instructions: INR 3.0  Take one tablet today, then resume two tablets every day except for one tablet on Fridays.  Recheck in four weeks.     Current Anticoagulation Instructions: INR 2.3  Continue taking same dose of 2 tablets everyday except 1 tablet on Friday. Recheck in 4 weeks.

## 2010-08-21 NOTE — Progress Notes (Signed)
Summary: BP f/u   ---- Converted from flag ---- ---- 09/25/2009 11:05 AM, Lenoria Farrier, MD, [facility] wrote: Sounds ok.  Postural BP check may be helpful BB  ---- 09/25/2009 10:59 AM, Eda Keys wrote: Mr. Lance was seen in coumadin clinic today and complained of minor dizziness.  He believes it is associated with his Lisinopril.  He reports the dizziness occurring primarily upon going from lying or sitting to standing.  He also does not drink alot of fluids throughout the day.  I have asked him to use caution when rising and to maintain good hydration throughout the day in order to minimize orthostatic hypotension.  His blood pressure today was 122/62.  If you have further recommendations or medication changes, please let me or Mr. Mollett know.  Thank you!! ------------------------------  Phone Note Outgoing Call   Call placed by: Sherri Rad, RN, BSN,  October 07, 2009 5:13 PM Summary of Call: I called today to f/u with the pt on how is feeling. He states he is doing some better, but still has some dizziness with changing position. I explained that since that is happening, we would like to check orthostatic bp readings when the pt comes in for his next coumadin appt. The pt is agreeable with this. He will be here on 3/30. Initial call taken by: Sherri Rad, RN, BSN,  October 07, 2009 5:18 PM

## 2010-08-21 NOTE — Letter (Signed)
Summary: [facility] Office Note  [facility] Office Note   Imported By: Roderic Ovens 08/09/2009 15:36:07  _____________________________________________________________________  External Attachment:    Type:   Image     Comment:   External Document

## 2010-08-21 NOTE — Assessment & Plan Note (Signed)
Summary: f/u 6 months///kp   Primary Care Provider:  dr Kriste Basque  CC:  6 month ROV & review of mult medical problems....  History of Present Illness: 77 y/o WM w/ mult med problems here for a follow up OV...     ~  August 21, 2009:  he tells me he was planning right TKr by DrOlin & had pre-op eval DrBBrodie w/ 2DEcho- unchanged nonischemic cardiomyopathy & pt indicates that they advised against surg... he notes Pred helped his knee pain & we discussed trial of 5mg /d to see if it makes life more bearable... today he requests 120d refills for his meds...    1.  Non-obstructive CAD, Cardiomyopathy, LBBB - on LISINOPRIL 10mg /d,  LASIX 20mg /d... followed by DrBBrodie for Cards... his prev TOPROL was stopped secondary to bradycardia, and he has prev refused to take Vasotec...   ~  baseline EKG= NSR, LBBB...  ~  2DEcho 11/08 showed global HK, dil LV, EF=35-40%, mild AR/ MR...   ~  labs 6/09 showed BUN=20, Creat= 1.1, K= 4.8, BNP= 57...  ~  2DEcho 5/10 showed LV mod dil w/ diffuse HK & EF= 30-35%, mild MR & AI, dil atria & incr PAsys=48.  ~  Pre-op eval 12/10 by BB w/ repeat echo (no change)- pt states he advised against the ortho surg.  2.  Hx DVT - he continues on Coumadin and followed in the coumadin clinic...  3.  DM - he's on diet alone... his BS at home has averaged 120 by his report...  ~  labs 2/08 showed BS= 128, HgA1c = 6.3  ~  labs 6/09 showed BS= 118, HgA1c= 6.3  ~  he saw DrGroat 2/10 for eye check- no retinopathy...  ~  labs in 2010 showed BS's 120-190  ~  labs 8/10 showed BS= 109, A1c= 6.3  ~  labs 2/11 showed BS=   4.  Chol - he's been taking PRAVASTATIN 40 and tolerating this well... he prev had trouble w/ Zocor w/ increased LFTs...   ~  FLP 5/08 showed TChol 212, TG 77, HDL 75, LDL 108  ~  FLP 6/09 showed TChol 180, TG 80, HDL 54, LDL 110  ~  FLP 2/10 showed TChol 226, TG 43, HDL 65, LDL 148... he declined to incr the Prav to 80.  ~  FLP 8/10 showed TChol 187, TG 87, HDL  52, LDL 87  5.  DJD - he had a Left TKR by DrOlin 8/08... he went to the [facility] post op to recoup, then back home... after that his wife fell and broke her hip...  now having incr difficulty w/ right knee and had shot from ortho- last Jan10... he takes VICODIN Tid Prn,  MOBIC 7.5mg  Prn, & LYRICA 75mg /d... along w/ Calcium, MVI, & Vit D...  ~  DrOlin planned right TKR 1/11- pre-op eval from [facility] & pt reports they advised against surg.  6. GERD &  IBS - on PREVACID 30mg /d, & Zantac 150mg HS, Bentyl 20mg Tid Prn which really helps his IBS symptoms... + Phenergan 25mg Prn nausea...   ~  2/10:  c/o incr reflux & peptic symptoms... advised start PREVACID 30mg /d in AM & take Zantac HS...   7.  PROSTATE CANCER - followed by DrPeterson/ [place] & treated w/ radium seeds in 2000...  ~  labs 2/10 showed PSA= 0.02  8.  SHINGLES? - he states that he "caught" shingles from a guy he was sitting next to at a restaurant & broke out on  his leg 2-3d later... he went to see Derm= DrLeschin & treated...    Allergies (verified): No Known Drug Allergies  Comments:  Nurse/Medical Assistant: The patient's medications and allergies were reviewed with the patient and were updated in the Medication and Allergy Lists.  Past History:  Past Medical History:  CORONARY ARTERY DISEASE (ICD-414.00) CARDIOMYOPATHY (ICD-425.4) LBBB (ICD-426.3) Hx of DVT (ICD-453.40) HYPERLIPIDEMIA (ICD-272.4) DM (ICD-250.00) GERD (ICD-530.81) DIVERTICULOSIS, COLON (ICD-562.10) IBS (ICD-564.1) PROSTATE CANCER, HX OF (ICD-V10.46) DEGENERATIVE JOINT DISEASE (ICD-715.90) SPINAL STENOSIS, LUMBAR (ICD-724.02) PERIPHERAL NEUROPATHY (ICD-356.9) 1. Nonischemic cardiomyopathy with ejection fraction 35-40%. 2. Nonobstructive coronary disease at catheterization 2002. 3. 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 4. Sinus bradycardia requiring discontinuation of Toprol. 5. Prostate cancer status post radiation. 6.  Diabetes. 7. Hyperlipidemia with abnormal liver function tests on Zocor.   Past Surgical History:  Appendectomy Cholecystectomy - 1985 by DrAbrams L Knee Arthroscopy - 1991 by DrKrege ERCP w/ Sphincterotomy and Stone Removal - 5/93 by DrStark Bilat Inguinal Hernia Repairs - 1998 by DrPrice S/P Radium Seed Implantation for Prostate Ca - 1/00 by DrPeterson & Goodchild R Cataract Surgery - 2004 by DrHecker S/P Repair of Recurrent R Inguinal Hernia - 12/07 by Rosalio Macadamia Basal Cell Cancer L Ear - S/P Moh's Surg 3/08 DrLeshin L Total Knee Replacement - 8/08 by DrOlin  Family History: Reviewed history from 11/14/2008 and no changes required.  Brother with throat cancer  Father deceased with coronary artery disease.  Social History: Reviewed history from 11/14/2008 and no changes required. Married, no children Alcohol Use - no  Review of Systems      See HPI       The patient complains of dyspnea on exertion and difficulty walking.  The patient denies anorexia, fever, weight loss, weight gain, vision loss, decreased hearing, hoarseness, chest pain, syncope, peripheral edema, prolonged cough, headaches, hemoptysis, abdominal pain, melena, hematochezia, severe indigestion/heartburn, hematuria, incontinence, muscle weakness, suspicious skin lesions, transient blindness, depression, unusual weight change, abnormal bleeding, enlarged lymph nodes, and angioedema.    Vital Signs:  Patient profile:   77 year old male Height:      70 inches Weight:      194.13 pounds O2 Sat:      99 % on Room air Temp:     97.3 degrees F oral Pulse rate:   81 / minute BP sitting:   118 / 60  (right arm) Cuff size:   regular  Vitals Entered By: Randell Loop CMA (August 21, 2009 10:21 AM)  O2 Sat at Rest %:  99 O2 Flow:  Room air CC: 6 month ROV & review of mult medical problems... Comments meds updated today   Physical Exam  Additional Exam:  WD, WN, 84 WM in NAD... GENERAL:  Alert & oriented;  pleasant & cooperative... HEENT:  [place]/AT, EOM-wnl, PERRLA, EACs-clear, TMs-wnl, NOSE-clear, THROAT-clear & wnl. NECK:  Supple w/ fair ROM; no JVD; normal carotid impulses w/o bruits; no thyromegaly or nodules palpated; no lymphadenopathy. CHEST:  Clear to P & A; without wheezes/ rales/ or rhonchi. HEART:  Regular Rhythm; without murmurs/ rubs/ or gallops. ABDOMEN:  Soft & nontender; normal bowel sounds; no organomegaly or masses detected. EXT:  s/p left TKR, mod-severe arthritic changes; mild venous insuffic changes, no signif edema. NEURO:  CN's intact; motor testing normal; sensory testing equivocal;  gait abnormal, balance fair. DERM:  No lesions noted; no rash etc...     Impression & Recommendations:  Problem # 1:  CARDIOMYOPATHY (ICD-425.4) He has  a nonischemic cardiomyopathy w/ 2 Echos in 2010- no changes w/ EF= 30-35% range & clinically stable... he is disappointed that this has rendered him unable to have the right TKR... continue meds from DrBBrodie.  Problem # 2:  Hx of DVT (ICD-453.40) Stable on Coumadin via the CC...  Problem # 3:  HYPERLIPIDEMIA (ICD-272.4) Stable on the Prav40 + diet Rx. His updated medication list for this problem includes:    Pravastatin Sodium 40 Mg Tabs (Pravastatin sodium) .Marland Kitchen... Take 1 tab by mouth at bedtime...  Problem # 4:  DM (ICD-250.00) Stable on DM diet as well... His updated medication list for this problem includes:    Lisinopril 10 Mg Tabs (Lisinopril) .Marland Kitchen... Take 1 tab by mouth once daily...  Problem # 5:  GERD (ICD-530.81) GI is stable-  same meds. His updated medication list for this problem includes:    Lansoprazole 30 Mg Cpdr (Lansoprazole) .Marland Kitchen... Take 1 tab by mouth once daily- 30 min before the first meal of the day...    Zantac 150 Mg Tabs (Ranitidine hcl) .Marland Kitchen... Take 1 tablet by mouth once a day    Bentyl 20 Mg Tabs (Dicyclomine hcl) .Marland Kitchen... 1 tab by mouth three times daily  as needed abd cramping  Problem # 6:  PROSTATE CANCER,  HX OF (ICD-V10.46) GU per DrPeterson...  Problem # 7:  OTHER MEDICAL PROBLEMS AS NOTED>>> He gets med[facility] from [facility]...  Problem # 8:  DEGENERATIVE JOINT DISEASE (ICD-715.90) We discussed trial of Pred 5mg /d... he notes this REALLY helped in the past... His updated medication list for this problem includes:    Mobic 7.5 Mg Tabs (Meloxicam) .Marland Kitchen... 1 by mouth once daily as needed for arthritis    Hydrocodone-acetaminophen 5-500 Mg Tabs (Hydrocodone-acetaminophen) .Marland Kitchen... 1 tab by mouth q6-8h as needed pain (not to exceed 3 per day)  Complete Medication List: 1)  Coumadin 2 Mg Tabs (Warfarin sodium) .... As directed by the coumadin clinic 2)  Lisinopril 10 Mg Tabs (Lisinopril) .... Take 1 tab by mouth once daily.Marland KitchenMarland Kitchen 3)  Lasix 20 Mg Tabs (Furosemide) .Marland Kitchen.. 1 tab by mouth once daily 4)  Pravastatin Sodium 40 Mg Tabs (Pravastatin sodium) .... Take 1 tab by mouth at bedtime.Marland KitchenMarland Kitchen 5)  Lansoprazole 30 Mg Cpdr (Lansoprazole) .... Take 1 tab by mouth once daily- 30 min before the first meal of the day... 6)  Zantac 150 Mg Tabs (Ranitidine hcl) .... Take 1 tablet by mouth once a day 7)  Bentyl 20 Mg Tabs (Dicyclomine hcl) .Marland Kitchen.. 1 tab by mouth three times daily  as needed abd cramping 8)  Promethazine Hcl 25 Mg Tabs (Promethazine hcl) .Marland Kitchen.. 1 tab q6h as needed nausea 9)  Mobic 7.5 Mg Tabs (Meloxicam) .Marland Kitchen.. 1 by mouth once daily as needed for arthritis 10)  Hydrocodone-acetaminophen 5-500 Mg Tabs (Hydrocodone-acetaminophen) .Marland Kitchen.. 1 tab by mouth q6-8h as needed pain (not to exceed 3 per day) 11)  Lyrica 75 Mg Caps (Pregabalin) .Marland Kitchen.. 1 cap by mouth once daily 12)  Caltrate 600+d 600-400 Mg-unit Tabs (Calcium carbonate-vitamin d) .... Take on tablet by mouth two times a day 13)  Multivitamins Tabs (Multiple vitamin) .Marland Kitchen.. 1 tab daily 14)  Vitamin D-1000 Max St [phone] Mg-unit Tabs (Calcium-vitamin d) .... Take 1 tablet by mouth once a day 15)  Prednisone 5 Mg Tabs (Prednisone) .... Take 1 tab by mouth once  daily...  Other Orders: Prescription Created Electronically [id number])  Patient Instructions: 1)  Today we updated your med list- see below.... 2)  We refilled  your meds per request... 3)  Call for any questions.Marland KitchenMarland Kitchen 4)  Please schedule a follow-up appointment in 6 months, and we will plan FASTING blood work at that time... Prescriptions: PREDNISONE 5 MG TABS (PREDNISONE) take 1 tab by mouth once daily...  #120 x 1   Entered and Authorized by:   Michele Mcalpine MD   Signed by:   Michele Mcalpine MD on 08/21/2009   Method used:   Print then Give to Patient   RxID:   [id number] HYDROCODONE-ACETAMINOPHEN 5-500 MG  TABS (HYDROCODONE-ACETAMINOPHEN) 1 tab by mouth q6-8h as needed pain (not to exceed 3 per day)  #100 x 5   Entered and Authorized by:   Michele Mcalpine MD   Signed by:   Michele Mcalpine MD on 08/21/2009   Method used:   Print then Give to Patient   RxID:   [id number] ZANTAC 150 MG  TABS (RANITIDINE HCL) Take 1 tablet by mouth once a day  #120 x 3   Entered and Authorized by:   Michele Mcalpine MD   Signed by:   Michele Mcalpine MD on 08/21/2009   Method used:   Print then Give to Patient   RxID:   [id number] PRAVASTATIN SODIUM 40 MG TABS (PRAVASTATIN SODIUM) take 1 tab by mouth at bedtime...  #120 x 3   Entered and Authorized by:   Michele Mcalpine MD   Signed by:   Michele Mcalpine MD on 08/21/2009   Method used:   Print then Give to Patient   RxID:   [id number] LASIX 20 MG  TABS (FUROSEMIDE) 1 tab by mouth once daily  #120 x 3   Entered and Authorized by:   Michele Mcalpine MD   Signed by:   Michele Mcalpine MD on 08/21/2009   Method used:   Print then Give to Patient   RxID:   [id number] LISINOPRIL 10 MG TABS (LISINOPRIL) take 1 tab by mouth once daily...  #120 x 3   Entered and Authorized by:   Michele Mcalpine MD   Signed by:   Michele Mcalpine MD on 08/21/2009   Method used:   Print then Give to Patient   RxID:   [id number]

## 2010-08-21 NOTE — Medication Information (Signed)
Summary: rov/sl  Medications Added LOSARTAN POTASSIUM 50 MG TABS (LOSARTAN POTASSIUM) once daily       Anticoagulant Therapy  Managed by: Weston Brass, PharmD Referring MD: Charlies Constable MD PCP: dr Merry Proud MD: Daleen Squibb MD, Maisie Fus Indication 1: Deep Vein Thrombosis - Leg (ICD-451.1) Lab Used: [facility] Site: [facility] INR POC 3.0 INR RANGE 2 - 3  Dietary changes: no    Health status changes: no    Bleeding/hemorrhagic complications: no    Recent/future hospitalizations: no    Any changes in medication regimen? yes       Details: stopped ramipril, started losartan  Recent/future dental: no  Any missed doses?: no       Is patient compliant with meds? yes       Allergies: No Known Drug Allergies  Anticoagulation Management History:      The patient is taking warfarin and comes in today for a routine follow up visit.  Positive risk factors for bleeding include an age of 75 years or older and presence of serious comorbidities.  The bleeding index is 'intermediate risk'.  Positive CHADS2 values include History of CHF, Age > 33 years old, and History of Diabetes.  The start date was 07/31/2000.  Anticoagulation responsible [place]: Daleen Squibb MD, Maisie Fus.  INR POC: 3.0.  Cuvette Lot#: [id number].  Exp: 05/2011.    Anticoagulation Management Assessment/Plan:      The patient's current anticoagulation dose is Coumadin 2 mg  tabs: as directed by the coumadin clinic.  The target INR is 2.0-3.0.  The next INR is due 05/14/2010.  Anticoagulation instructions were given to patient.  Results were reviewed/authorized by Weston Brass, PharmD.  He was notified by Kennieth Francois.         Prior Anticoagulation Instructions: INR 2.5  Continue taking Coumadin 2 tabs (4 mg) all days except Coumadin 1 tab (2 mg) on Friday. Return to clinic in 4 weeks.   Current Anticoagulation Instructions: INR 3.0  Take one tablet today, then resume two tablets every day except for one tablet on Fridays.   Recheck in four weeks.

## 2010-08-21 NOTE — Medication Information (Signed)
Summary: rov/tm  Anticoagulant Therapy  Managed by: Cloyde Reams, RN, BSN Referring MD: Charlies Constable MD PCP: dr Merry Proud MD: Graciela Husbands MD, Viviann Spare Indication 1: Deep Vein Thrombosis - Leg (ICD-451.1) Lab Used: [facility] Site: [facility] INR POC 2.1 INR RANGE 2 - 3  Dietary changes: no    Health status changes: no    Bleeding/hemorrhagic complications: no    Recent/future hospitalizations: no    Any changes in medication regimen? no    Recent/future dental: no  Any missed doses?: no       Is patient compliant with meds? yes       Allergies: No Known Drug Allergies  Anticoagulation Management History:      The patient is taking warfarin and comes in today for a routine follow up visit.  Positive risk factors for bleeding include an age of 75 years or older and presence of serious comorbidities.  The bleeding index is 'intermediate risk'.  Positive CHADS2 values include History of CHF, Age > 64 years old, and History of Diabetes.  The start date was 07/31/2000.  Anticoagulation responsible provider: Graciela Husbands MD, Viviann Spare.  INR POC: 2.1.  Cuvette Lot#: [id number].  Exp: 03/2011.    Anticoagulation Management Assessment/Plan:      The patient's current anticoagulation dose is Coumadin 2 mg  tabs: as directed by the coumadin clinic.  The target INR is 2.0-3.0.  The next INR is due 02/19/2010.  Anticoagulation instructions were given to patient.  Results were reviewed/authorized by Cloyde Reams, RN, BSN.  He was notified by Cloyde Reams, RN, BSN.         Prior Anticoagulation Instructions: INR 1.8 Today take 3 pill then change dose to 2 pills everyday except 1 pill on Fridays. Recheck in 2 weeks.   Current Anticoagulation Instructions: INR 2.1  Continue on same dosage 2 tablets daily except 1 tablet on Fridays.  Recheck in 3 weeks.

## 2010-08-21 NOTE — Medication Information (Signed)
Summary: rov/sel      Allergies Added: NKDA Anticoagulant Therapy  Managed by: Leota Sauers, PharmD, BCPS, CPP Referring MD: Charlies Constable MD PCP: dr Merry Proud MD: Bensimhon MD, Reuel Boom Indication 1: Deep Vein Thrombosis - Leg (ICD-451.1) Lab Used: [facility] Site: [facility] INR POC 2.2 INR RANGE 2 - 3  Dietary changes: no    Health status changes: no    Bleeding/hemorrhagic complications: no    Recent/future hospitalizations: no    Any changes in medication regimen? no    Recent/future dental: no  Any missed doses?: no       Is patient compliant with meds? yes       Current Medications (verified): 1)  Coumadin 2 Mg  Tabs (Warfarin Sodium) .... As Directed By The [facility]madin Clinic 2)  Losartan Potassium 50 Mg Tabs (Losartan Potassium) .... Take One Tab By Mouth Once Daily 3)  Lasix 20 Mg  Tabs (Furosemide) .Marland Kitchen.. 1 Tab By Mouth Once Daily 4)  Pravastatin Sodium 40 Mg Tabs (Pravastatin Sodium) .... Take 1 Tab By Mouth At Bedtime.Marland KitchenMarland Kitchen 5)  Nexium 40 Mg Cpdr (Esomeprazole Magnesium) .... Take One Tablet By Mouth 30 Mins Prior To The First Meal of The Day 6)  Zantac 150 Mg  Tabs (Ranitidine Hcl) .... Take 1 Tablet By Mouth Once A Day 7)  Bentyl 20 Mg  Tabs (Dicyclomine Hcl) .Marland Kitchen.. 1 Tab By Mouth Three Times Daily As Needed Abd Cramping... 8)  Mobic 7.5 Mg Tabs (Meloxicam) .Marland Kitchen.. 1 By Mouth Once Daily As Needed For Arthritis 9)  Hydrocodone-Acetaminophen 5-500 Mg  Tabs (Hydrocodone-Acetaminophen) .Marland Kitchen.. 1 Tab By Mouth Q6-8h As Needed Pain (Not To Exceed 3 Per Day) 10)  Lyrica 75 Mg  Caps (Pregabalin) .Marland Kitchen.. 1 Cap By Mouth Once Daily 11)  Caltrate 600+d 600-400 Mg-Unit  Tabs (Calcium Carbonate-Vitamin D) .... Take On Tablet By Mouth Two Times A Day 12)  Multivitamins   Tabs (Multiple Vitamin) .Marland Kitchen.. 1 Tab Daily 13)  Vitamin D-[address] [phone] Mg-Unit  Tabs (Calcium-Vitamin D) .... Take 1 Tablet By Mouth Once A Day 14)  Losartan Potassium 50 Mg Tabs (Losartan Potassium) .... Once  Daily  Allergies (verified): No Known Drug Allergies  Anticoagulation Management History:      The patient is taking warfarin and comes in today for a routine follow up visit.  Positive risk factors for bleeding include an age of 41 years or older and presence of serious comorbidities.  The bleeding index is 'intermediate risk'.  Positive CHADS2 values include History of CHF, Age > 63 years old, and History of Diabetes.  The start date was 07/31/2000.  Anticoagulation responsible provider: Bensimhon MD, Reuel Boom.  INR POC: 2.2.  Cuvette Lot#: [id number].  Exp: 05/2011.    Anticoagulation Management Assessment/Plan:      The patient's current anticoagulation dose is Coumadin 2 mg  tabs: as directed by the coumadin clinic.  The target INR is 2.0-3.0.  The next INR is due 07/09/2010.  Anticoagulation instructions were given to patient.  Results were reviewed/authorized by Leota Sauers, PharmD, BCPS, CPP.         Prior Anticoagulation Instructions: INR 2.3  Continue taking same dose of 2 tablets everyday except 1 tablet on Friday. Recheck in 4 weeks.   Current Anticoagulation Instructions: INR 2.2  Coumadin 2mg  tabs take 2 tabs each day except 1 tab on Fri

## 2010-08-21 NOTE — Medication Information (Signed)
Summary: [name] [name]  Anticoagulant Therapy  Managed by: Bethena Midget, RN, BSN Referring MD: Charlies Constable MD PCP: dr Merry Proud MD: Riley Kill MD, Maisie Fus Indication 1: Deep Vein Thrombosis - Leg (ICD-451.1) Lab Used: [facility] Site: [facility] INR POC 2.5 INR RANGE 2 - 3  Dietary changes: no    Health status changes: no    Bleeding/hemorrhagic complications: no    Recent/future hospitalizations: no    Any changes in medication regimen? yes       Details: Changed from Ramapril to Losartin  Recent/future dental: no  Any missed doses?: no       Is patient compliant with meds? yes       Allergies: No Known Drug Allergies  Anticoagulation Management History:      The patient is taking warfarin and comes in today for a routine follow up visit.  Positive risk factors for bleeding include an age of 37 years or older and presence of serious comorbidities.  The bleeding index is 'intermediate risk'.  Positive CHADS2 values include History of CHF, Age > 51 years old, and History of Diabetes.  The start date was 07/31/2000.  Anticoagulation responsible provider: Riley Kill MD, Maisie Fus.  INR POC: 2.5.  Cuvette Lot#: [id number].  Exp: 04/2011.    Anticoagulation Management Assessment/Plan:      The patient's current anticoagulation dose is Coumadin 2 mg  tabs: as directed by the coumadin clinic.  The target INR is 2.0-3.0.  The next INR is due 03/19/2010.  Anticoagulation instructions were given to patient.  Results were reviewed/authorized by Bethena Midget, RN, BSN.  He was notified by Bethena Midget, RN, BSN.         Prior Anticoagulation Instructions: INR 2.1  Continue on same dosage 2 tablets daily except 1 tablet on Fridays.  Recheck in 3 weeks.    Current Anticoagulation Instructions: INR 2.5 Continue 4mg s everyday except 2mg s on Fridays. Recheck in 4 weeks.

## 2010-08-21 NOTE — Medication Information (Signed)
Summary: rov/cb  Anticoagulant Therapy  Managed by: Bethena Midget, RN, BSN Referring MD: Charlies Constable MD PCP: dr Merry Proud MD: Riley Kill MD, Maisie Fus Indication 1: Deep Vein Thrombosis - Leg (ICD-451.1) Lab Used: [facility] Site: [facility] INR POC 1.8 INR RANGE 2 - 3  Dietary changes: no    Health status changes: no    Bleeding/hemorrhagic complications: no    Recent/future hospitalizations: no    Any changes in medication regimen? yes       Details: Stopping Ramipril and starting Cozaar  Recent/future dental: no  Any missed doses?: no       Is patient compliant with meds? yes       Allergies: No Known Drug Allergies  Anticoagulation Management History:      The patient is taking warfarin and comes in today for a routine follow up visit.  Positive risk factors for bleeding include an age of 75 years or older and presence of serious comorbidities.  The bleeding index is 'intermediate risk'.  Positive CHADS2 values include History of CHF, Age > 84 years old, and History of Diabetes.  The start date was 07/31/2000.  Anticoagulation responsible provider: Riley Kill MD, Maisie Fus.  INR POC: 1.8.  Cuvette Lot#: [id number].  Exp: 03/2011.    Anticoagulation Management Assessment/Plan:      The patient's current anticoagulation dose is Coumadin 2 mg  tabs: as directed by the coumadin clinic.  The target INR is 2.0-3.0.  The next INR is due 01/30/2010.  Anticoagulation instructions were given to patient.  Results were reviewed/authorized by Bethena Midget, RN, BSN.  He was notified by Bethena Midget, RN, BSN.         Prior Anticoagulation Instructions: INR 2.2. Take 2 tablets daily except 1 tablet on Mon and Fri.  Current Anticoagulation Instructions: INR 1.8 Today take 3 pill then change dose to 2 pills everyday except 1 pill on Fridays. Recheck in 2 weeks.

## 2010-08-21 NOTE — Medication Information (Signed)
Summary: rov/tp   Anticoagulant Therapy  Managed by: Geoffry Paradise, PharmD Referring MD: Charlies Constable MD PCP: dr Merry Proud MD: Shirlee Latch MD, Jemmie Ledgerwood Indication 1: Deep Vein Thrombosis - Leg (ICD-451.1) Lab Used: [facility] Site: [facility] INR POC 2.8 INR RANGE 2 - 3  Dietary changes: no    Health status changes: no    Bleeding/hemorrhagic complications: no    Recent/future hospitalizations: no    Any changes in medication regimen? no    Recent/future dental: no  Any missed doses?: no       Is patient compliant with meds? yes       Allergies: No Known Drug Allergies  Anticoagulation Management History:      The patient is taking warfarin and comes in today for a routine follow up visit.  Positive risk factors for bleeding include an age of 75 years or older and presence of serious comorbidities.  The bleeding index is 'intermediate risk'.  Positive CHADS2 values include History of CHF, History of HTN, Age > 75 years old, and History of Diabetes.  The start date was 07/31/2000.  Anticoagulation responsible provider: Shirlee Latch MD, Niki Payment.  INR POC: 2.8.  Cuvette Lot#: [id number].  Exp: 07/2011.    Anticoagulation Management Assessment/Plan:      The patient's current anticoagulation dose is Coumadin 2 mg  tabs: as directed by the coumadin clinic.  The target INR is 2.0-3.0.  The next INR is due 09/03/2010.  Anticoagulation instructions were given to patient.  Results were reviewed/authorized by Geoffry Paradise, PharmD.         Prior Anticoagulation Instructions: INR 3.2  Take 1 tablet today then resume same dose of 2 tablets every day except 1 tablet on Friday.  Recheck INR in 4 weeks.   Current Anticoagulation Instructions: INR:  2.8 (goal 2-3)  Your INR is at goal today.  Please continue taking 4 mg everyday exept 2 mg on Fridays.  Return to clinic in 4 weeks for another INR check.

## 2010-08-21 NOTE — Medication Information (Signed)
Summary: [name] [name]  Anticoagulant Therapy  Managed by: Bethena Midget, RN, BSN Referring MD: Charlies Constable MD PCP: dr Merry Proud MD: Excell Seltzer MD, Casimiro Needle Indication 1: Deep Vein Thrombosis - Leg (ICD-451.1) Lab Used: [facility] Site: [facility] INR POC 3.7 INR RANGE 2 - 3  Dietary changes: no    Health status changes: no    Bleeding/hemorrhagic complications: no    Recent/future hospitalizations: no    Any changes in medication regimen? yes       Details: Started Prednisone 5mg s last Thursday for 30 days   Recent/future dental: no  Any missed doses?: no       Is patient compliant with meds? yes      Comments: Having steriod injection into Rt knee on Wednesday. Per Doretha Imus pt. to hold 2 doses.   Allergies: No Known Drug Allergies  Anticoagulation Management History:      The patient is taking warfarin and comes in today for a routine follow up visit.  Positive risk factors for bleeding include an age of 75 years or older and presence of serious comorbidities.  The bleeding index is 'intermediate risk'.  Positive CHADS2 values include History of CHF, Age > 51 years old, and History of Diabetes.  The start date was 07/31/2000.  Anticoagulation responsible provider: Excell Seltzer MD, Casimiro Needle.  INR POC: 3.7.  Cuvette Lot#: [id number].  Exp: 10/2010.    Anticoagulation Management Assessment/Plan:      The patient's current anticoagulation dose is Coumadin 2 mg  tabs: as directed by the coumadin clinic.  The target INR is 2.0-3.0.  The next INR is due 09/09/2009.  Anticoagulation instructions were given to patient.  Results were reviewed/authorized by Bethena Midget, RN, BSN.  He was notified by Bethena Midget, RN, BSN.         Prior Anticoagulation Instructions: INR 1.7  Take 3 tabs today only, then increase to 2 tabs daily except 1 tab each Friday.    Recheck in 3 weeks.    Current Anticoagulation Instructions: INR 3.7 Skip today's dose and Tuesdays then change dose to 2 tablets  everyday except on Mondays and Fridays take 1 tablet. Recheck in 2 weeks.

## 2010-08-21 NOTE — Letter (Signed)
Summary: [facility] [employer]  [facility] [employer] [employer]   Imported By: Sherian Rein 09/11/2009 12:14:05  _____________________________________________________________________  External Attachment:    Type:   Image     Comment:   External Document

## 2010-08-21 NOTE — Medication Information (Signed)
Summary: rov/eac  Anticoagulant Therapy  Managed by: Cloyde Reams, RN, BSN Referring MD: Charlies Constable MD PCP: dr Merry Proud MD: Eden Emms MD, Theron Arista Indication 1: Deep Vein Thrombosis - Leg (ICD-451.1) Lab Used: [facility] Site: [facility] INR POC 2.0 INR RANGE 2 - 3  Dietary changes: no    Health status changes: no    Bleeding/hemorrhagic complications: no    Recent/future hospitalizations: no    Any changes in medication regimen? no    Recent/future dental: no  Any missed doses?: no       Is patient compliant with meds? yes       Allergies (verified): No Known Drug Allergies  Anticoagulation Management History:      The patient is taking warfarin and comes in today for a routine follow up visit.  Positive risk factors for bleeding include an age of 46 years or older and presence of serious comorbidities.  The bleeding index is 'intermediate risk'.  Positive CHADS2 values include History of CHF, Age > 17 years old, and History of Diabetes.  The start date was 07/31/2000.  Anticoagulation responsible provider: Eden Emms MD, Theron Arista.  INR POC: 2.0.  Cuvette Lot#: [id number].  Exp: 11/2010.    Anticoagulation Management Assessment/Plan:      The patient's current anticoagulation dose is Coumadin 2 mg  tabs: as directed by the coumadin clinic.  The target INR is 2.0-3.0.  The next INR is due 11/13/2009.  Anticoagulation instructions were given to patient.  Results were reviewed/authorized by Cloyde Reams, RN, BSN.  He was notified by Cloyde Reams RN.         Prior Anticoagulation Instructions: INR 1.9  Take 3 tablets today.  Then return to normal dosing schedule of 2 mg on Monday and Friday and 4 mg all other days.  Return to clinic in 3 weeks.     Current Anticoagulation Instructions: INR 2.0  Take 3 tablets today then resume same dosage 2 tablets daily except 1 tablet on Mondays and Fridays.  Recheck in 4 weeks.

## 2010-08-21 NOTE — Progress Notes (Signed)
Summary: returning call/lab results  Medications Added LOSARTAN POTASSIUM 50 MG TABS (LOSARTAN POTASSIUM) take one tab by mouth once daily       Phone Note Call from Patient Call back at Home Phone [phone]   Caller: Patient Reason for Call: Talk to Nurse, Lab or Test Results Summary of Call: returning call, request lab results Initial call taken by: Migdalia Dk,  January 10, 2010 1:29 PM  Follow-up for Phone Call        I called and spoke with the pt. He states that he does not feel like he can tolerate the ramipril. He is dizzy like he was on zestril. He states he SBP is in the 120's. I have explained this is a good bp to have and that Dr. Juanda Chance will probably want to continue him an the class of medication due to his low EF, but that we may need to adjust the dose. I will discuss with Dr. Juanda Chance and call the pt back next week. He is agreeable.  Follow-up by: Sherri Rad, RN, BSN,  January 10, 2010 1:39 PM  Additional Follow-up for Phone Call Additional follow up Details #1::        I have discussed the above with Dr. Juanda Chance- orders received to d/c ramipril and start cozaar 50mg  once daily. I will notify the pt on my return to the office tomorrow. Sherri Rad, RN, BSN  January 14, 2010 5:54 PM  The pt was in our office today for a coumadin visit. He is agreeable to stopping ramipril and starting Cozaar 50mg  once daily. I will fax this presription to his pharmacy. Additional Follow-up by: Sherri Rad, RN, BSN,  January 15, 2010 2:42 PM    New/Updated Medications: LOSARTAN POTASSIUM 50 MG TABS (LOSARTAN POTASSIUM) take one tab by mouth once daily Prescriptions: LOSARTAN POTASSIUM 50 MG TABS (LOSARTAN POTASSIUM) take one tab by mouth once daily  #30 x 6   Entered by:   Sherri Rad, RN, BSN   Authorized by:   Lenoria Farrier, MD, [facility]   Signed by:   Sherri Rad, RN, BSN on 01/15/2010   Method used:   Electronically to        [facility]  [phone]* (retail)       [address] [place], [place]  [address]       Ph: [phone] or [phone]       Fax: [phone]   RxID:   [id number]

## 2010-08-21 NOTE — Medication Information (Signed)
Summary: rov/ewj  Anticoagulant Therapy  Managed by: Cloyde Reams, RN, BSN Referring MD: Charlies Constable MD PCP: dr Merry Proud MD: Gala Romney MD, Reuel Boom Indication 1: Deep Vein Thrombosis - Leg (ICD-451.1) Lab Used: [facility] Site: [facility] INR POC 2.1 INR RANGE 2 - 3  Dietary changes: no    Health status changes: no    Bleeding/hemorrhagic complications: no    Recent/future hospitalizations: no    Any changes in medication regimen? no    Recent/future dental: no  Any missed doses?: no       Is patient compliant with meds? yes       Allergies (verified): No Known Drug Allergies  Anticoagulation Management History:      The patient is taking warfarin and comes in today for a routine follow up visit.  Positive risk factors for bleeding include an age of 8 years or older and presence of serious comorbidities.  The bleeding index is 'intermediate risk'.  Positive CHADS2 values include History of CHF, Age > 55 years old, and History of Diabetes.  The start date was 07/31/2000.  Anticoagulation responsible provider: Berda Shelvin MD, Reuel Boom.  INR POC: 2.1.  Cuvette Lot#: [id number].  Exp: 12/2010.    Anticoagulation Management Assessment/Plan:      The patient's current anticoagulation dose is Coumadin 2 mg  tabs: as directed by the coumadin clinic.  The target INR is 2.0-3.0.  The next INR is due 12/11/2009.  Anticoagulation instructions were given to patient.  Results were reviewed/authorized by Cloyde Reams, RN, BSN.  He was notified by Cloyde Reams RN.         Prior Anticoagulation Instructions: INR 2.0  Take 3 tablets today then resume same dosage 2 tablets daily except 1 tablet on Mondays and Fridays.  Recheck in 4 weeks.    Current Anticoagulation Instructions: INR 2.1  Continue on same dosage 2 tablets daily except 1 tablet on Mondays and Fridays.  Recheck in 4 weeks.

## 2010-08-21 NOTE — Progress Notes (Signed)
Summary: rx  Phone Note From Pharmacy Call back at [phone]   Caller: [facility] Call For: nadel  Summary of Call: previcid prescription - they do not stock.  Only have Nexium or prilosec. Initial call taken by: Eugene Gavia,  March 12, 2010 11:46 AM  Follow-up for Phone Call        SN, pls advise if okay to switch lansoprazole to nexium or prilosec.  Thanks! NKDA Follow-up by: Vernie Murders,  March 12, 2010 11:51 AM  Additional Follow-up for Phone Call Additional follow up Details #1::        per SN---ok to change to nexium 40mg   once daily.  this has been given to the pharmacist at [facility]. Randell Loop CMA  March 12, 2010 12:57 PM     New/Updated Medications: NEXIUM 40 MG CPDR (ESOMEPRAZOLE MAGNESIUM) take one tablet by mouth 30 mins prior to the first meal of the day

## 2010-08-27 NOTE — Assessment & Plan Note (Signed)
Summary: 6 moths   Vital Signs:  Patient profile:   77 year old male Height:      70 inches Weight:      195.50 pounds BMI:     28.15 O2 Sat:      98 % on Room air Temp:     97.0 degrees F oral Pulse rate:   71 / minute BP sitting:   112 / 62  (left arm) Cuff size:   regular  Vitals Entered By: Randell Loop CMA (August 20, 2010 10:47 AM)  O2 Sat at Rest %:  98 O2 Flow:  Room air CC: 6 month ROV & review of mult medical problems... Is Patient Diabetic? No Pain Assessment Patient in pain? no      Comments no changes in meds today with pt   Primary Care Provider:  dr Kriste Basque  CC:  6 month ROV & review of mult medical problems....  History of Present Illness: 77 y/o WM w/ mult med problems here for a follow up OV...     ~  February 19, 2010:  Overall stable w/ CC= his arthritis/ DJD> pain in knees & neck... he saw DrBBrodie 6/11> stable but c/o dizzy on Lisinopril, therefore changed to Ramipril but his head felt funny, so now on  LOSARTAN, not on BBlocker due to brady... BS controlled on diet + exercise... Chol OK on Prav40... PSA remains "zero" s/p seed implants... he gets all his meds filled at [facility].   ~  August 20, 2010:    He's had a good 75mo overall- CC= arthritis & he is holding off on right TKR from DrOlin per DrBBrodie's rec against surg;  he's been taking Lyrica  ~Qod for "feet burning"/ neuropathy symptoms but doesn't like it cause he thinks it's making him gain weight> we discussed change to Gabapentin 100-200-300mg  Qhs to see if this is more agreeable to him.    He notes BS at home incr to  ~145 ave & we will recheck FBS & A1c to see if he needs to start meds;  he wants to be sure he gets his permission slip signed for his free diabetic shoes.    He saw DrBB 12/11 for Cards f/u> doing well w/ his non-ischemic cardiomyopathy, EF= 30-35%, & no changes made; he will be following up w/ DrMcLean; he had some leg swelling & [facility] which [facility]  [facility] neg- pt on Coumadin via the CC for hx DVT.    1.  Non-obstructive CAD, Cardiomyopathy, LBBB - on LOSARTAN 50mg /d,  LASIX 20mg /d... followed by DrBBrodie for Cards... his prev Toprol was stopped secondary to bradycardia, and he has prev refused to take Vasotec, c/o dizzy from Lisinopril, & head felt funny on Altace.  ~  baseline EKG= NSR, LBBB...  ~  2DEcho 11/08 showed global HK, dil LV, EF=35-40%, mild AR/ MR...   ~  labs 6/09 showed BUN=20, Creat= 1.1, K= 4.8, BNP= 57...  ~  2DEcho 5/10 showed LV mod dil w/ diffuse HK & EF= 30-35%, mild MR & AI, dil atria & incr PAsys=48.  ~  Pre-op eval 12/10 by BB w/ repeat echo (no change)- pt states he advised against the ortho surg.  2.  Hx DVT - he continues on Coumadin and followed in the coumadin clinic...  3.  DM - he's on diet alone... he reports recent incr in his BS ave at home to  ~145 range.  ~  labs  2/08 showed BS= 128, HgA1c = 6.3  ~  labs 6/09 showed BS= 118, HgA1c= 6.3  ~  he saw DrGroat 2/10 for eye check- no retinopathy...  ~  labs 8/10 showed BS= 109, A1c= 6.3  ~  labs 8/11 showed BS= 112, A1c=6.7  ~  labs 2/12 showed BS= 116, A1c= 6.7.Marland KitchenMarland Kitchen OK to continue diet Rx.  4.  Chol - on PRAVASTATIN 40 and tolerating this well... prev had trouble w/ Zocor- increased LFTs.  ~  FLP 5/08 showed TChol 212, TG 77, HDL 75, LDL 108  ~  FLP 6/09 showed TChol 180, TG 80, HDL 54, LDL 110  ~  FLP 2/10 showed TChol 226, TG 43, HDL 65, LDL 148... he declined to incr the Prav to 80.  ~  FLP 8/10 showed TChol 187, TG 87, HDL 52, LDL 87  ~  FLP 8/11 showed TChol 176, TG 87, HDL 56, LDL 102  ~  FLP 2/12 showed TChol 157, TG 82, HDL 55, LDL 86  5.  DJD - he had a Left TKR by DrOlin 8/08... he went to the [facility] post op to recoup, then back home... after that his wife fell and broke her hip...  now having incr difficulty w/ right knee and had shots from ortho... he takes VICODIN Tid Prn,  MOBIC 7.5mg  Prn, along w/ Calcium, MVI, & Vit D... he's  been on LYRICA 75mg  Prn (ave Qod) for neuropathy in feet> but requesting change in med (ch to GABAPENTIN).  ~  DrOlin planned right TKR 1/11- pre-op eval from Cards & pt reports they advised against surg by DrBB.  ~  2/12:  c/o weight gain on Lyrica ( ~Qod) for neuropathy; rec ch to Gabapentin 100>200>300mg  Qhs as trial.  6. GERD &  IBS - on PREVACID 30mg /d, & Zantac 150mg HS, Bentyl 20mg Tid Prn which really helps his IBS symptoms...  ~  2/10:  c/o incr reflux & peptic symptoms... advised start PREVACID 30mg /d in AM & take Zantac HS...   7.  PROSTATE CANCER - followed by DrPeterson/ [place]t & treated w/ radium seeds in 2000...  ~  labs 2/10 showed PSA= 0.02  ~  labs 8/11 showed PSA= 0.02  8.  SHINGLES? - he states that he "caught" shingles from a guy he was sitting next to at a restaurant & broke out on his leg 2-3d later... he went to see Derm= DrLeschin & treated...   Preventive Screening-Counseling & Management  Alcohol-Tobacco     Smoking Status: quit     Packs/Day: 1.0     Year Quit: 1991  Allergies (verified): No Known Drug Allergies  Comments:  Nurse/Medical Assistant: The patient's medications and allergies were reviewed with the patient and were updated in the Medication and Allergy Lists.  Past History:  Past Medical History: CORONARY ARTERY DISEASE (ICD-414.00) CARDIOMYOPATHY (ICD-425.4) LBBB (ICD-426.3) Hx of DVT (ICD-453.40) HYPERLIPIDEMIA (ICD-272.4) DM (ICD-250.00) GERD (ICD-530.81) DIVERTICULOSIS, COLON (ICD-562.10) IBS (ICD-564.1) PROSTATE CANCER, HX OF (ICD-V10.46) DEGENERATIVE JOINT DISEASE (ICD-715.90) SPINAL STENOSIS, LUMBAR (ICD-724.02) PERIPHERAL NEUROPATHY (ICD-356.9)  1. Nonischemic cardiomyopathy with ejection fraction 35-40%. 2. Nonobstructive coronary disease at catheterization 2002. 3. 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 4. Sinus bradycardia requiring discontinuation of Toprol. 5. Prostate cancer status post  radiation. 6. Diabetes. 7. Hyperlipidemia with abnormal liver function tests on Zocor  Past Surgical History: Appendectomy Cholecystectomy - 1985 by DrAbrams L Knee Arthroscopy - 1991 by DrKrege ERCP w/ Sphincterotomy and Stone Removal - 5/93 by DrStark Bilat Inguinal  Hernia Repairs - 1998 by DrPrice S/P Radium Seed Implantation for Prostate Ca - 1/00 by DrPeterson & Goodchild R Cataract Surgery - 2004 by DrHecker S/P Repair of Recurrent R Inguinal Hernia - 12/07 by Rosalio Macadamia Basal Cell Cancer L Ear - S/P Moh's Surg 3/08 DrLeshin L Total Knee Replacement - 8/08 by DrOlin  Family History: Reviewed history from 02/19/2010 and no changes required.  Brother with throat cancer  Father deceased with coronary artery disease.  Social History: Reviewed history from 11/14/2008 and no changes required. Married, no children Alcohol Use - no  Review of Systems      See HPI       The patient complains of dyspnea on exertion.  The patient denies anorexia, fever, weight loss, weight gain, vision loss, decreased hearing, hoarseness, chest pain, syncope, peripheral edema, prolonged cough, headaches, hemoptysis, abdominal pain, melena, hematochezia, severe indigestion/heartburn, hematuria, incontinence, muscle weakness, suspicious skin lesions, transient blindness, difficulty walking, depression, unusual weight change, abnormal bleeding, enlarged lymph nodes, and angioedema.    Physical Exam  Additional Exam:  WD, WN, 85 WM in NAD... GENERAL:  Alert & oriented; pleasant & cooperative... HEENT:  [place]/AT, EOM-wnl, PERRLA, EACs-clear, TMs-wnl, NOSE-clear, THROAT-clear & wnl. NECK:  Supple w/ fair ROM; no JVD; normal carotid impulses w/o bruits; no thyromegaly or nodules palpated; no lymphadenopathy. CHEST:  Clear to P & A; without wheezes/ rales/ or rhonchi. HEART:  Regular Rhythm; without murmurs/ rubs/ or gallops. ABDOMEN:  Soft & nontender; normal bowel sounds; no organomegaly or masses  detected. EXT:  s/p left TKR, mod-severe arthritic changes; mild venous insuffic changes, no signif edema. NEURO:  CN's intact; motor testing normal; sensory testing equivocal;  gait abnormal, balance fair. DERM:  No lesions noted; no rash etc...    Impression & Recommendations:  Problem # 1:  HYPERTENSION, BENIGN (ICD-401.1) Controlled on meds>  continue same. His updated medication list for this problem includes:    Losartan Potassium 50 Mg Tabs (Losartan potassium) .Marland Kitchen... Take one tab by mouth once daily    Lasix 20 Mg Tabs (Furosemide) .Marland Kitchen... 1 tab by mouth once daily Future Orders: TLB-BMP (Basic Metabolic Panel-BMET) (80048-METABOL) ... 08/21/2010  Problem # 2:  CARDIOMYOPATHY (ICD-425.4) Followed by DrBB (his note from 12/11 is reviewed), now DrMcLean>  stable w/ EF 30-35% by last Echo...  Problem # 3:  Hx of DVT (ICD-453.40) Stable on Coumadin followed in the CC... continue same.  Problem # 4:  HYPERLIPIDEMIA (ICD-272.4) Stable on the Prav40 + diet Rx> continue same. His updated medication list for this problem includes:    Pravastatin Sodium 40 Mg Tabs (Pravastatin sodium) .Marland Kitchen... Take 1 tab by mouth at bedtime... Future Orders: TLB-Lipid Panel (80061-LIPID) ... 08/21/2010  Problem # 5:  DM (ICD-250.00) Diet controlled DM but he is concerned his BS at home are higher... f/u labs still OK on diet alone, he wants to be sure he can get his free diabetic shoes... His updated medication list for this problem includes:    Losartan Potassium 50 Mg Tabs (Losartan potassium) .Marland Kitchen... Take one tab by mouth once daily Future Orders: TLB-A1C / Hgb A1C (Glycohemoglobin) >> FBS=116, A1c=6.7  Problem # 6:  GERD (ICD-530.81) Hx of GERD & IBS w/ symtoms controlled on meds>  continue same. His updated medication list for this problem includes:    Nexium 40 Mg Cpdr (Esomeprazole magnesium) .Marland Kitchen... Take one tablet by mouth 30 mins prior to the first meal of the day    Zantac 150 Mg Tabs  (Ranitidine  hcl) ..... Take 1 tablet by mouth once a day    Bentyl 20 Mg Tabs (Dicyclomine hcl) .Marland Kitchen... 1 tab by mouth three times daily as needed abd cramping...  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  Problem # 8:  DEGENERATIVE JOINT DISEASE (ICD-715.90) Followed by DrOlin... he is holding off on TKR due to DrBBrodie's rec... His updated medication list for this problem includes:    Mobic 7.5 Mg Tabs (Meloxicam) .Marland Kitchen... 1 by mouth once daily as needed for arthritis    Hydrocodone-acetaminophen 5-500 Mg Tabs (Hydrocodone-acetaminophen) .Marland Kitchen... 1 tab by mouth q6-8h as needed pain.  Complete Medication List: 1)  Coumadin 2 Mg Tabs (Warfarin sodium) .... As directed by the coumadin clinic 2)  Losartan Potassium 50 Mg Tabs (Losartan potassium) .... Take one tab by mouth once daily 3)  Lasix 20 Mg Tabs (Furosemide) .Marland Kitchen.. 1 tab by mouth once daily 4)  Pravastatin Sodium 40 Mg Tabs (Pravastatin sodium) .... Take 1 tab by mouth at bedtime.Marland KitchenMarland Kitchen 5)  Nexium 40 Mg Cpdr (Esomeprazole magnesium) .... Take one tablet by mouth 30 mins prior to the first meal of the day 6)  Zantac 150 Mg Tabs (Ranitidine hcl) .... Take 1 tablet by mouth once a day 7)  Bentyl 20 Mg Tabs (Dicyclomine hcl) .Marland Kitchen.. 1 tab by mouth three times daily as needed abd cramping... 8)  Mobic 7.5 Mg Tabs (Meloxicam) .Marland Kitchen.. 1 by mouth once daily as needed for arthritis 9)  Hydrocodone-acetaminophen 5-500 Mg Tabs (Hydrocodone-acetaminophen) .Marland Kitchen.. 1 tab by mouth q6-8h as needed pain (not to exceed 3 per day) 10)  Gabapentin 100 Mg Caps (Gabapentin) .... Take 1 cap by mouth at bedtime for 1wk, then 2 at bedtime for 1wk, then 3 at bedtime thereafter... 11)  Caltrate 600+d 600-400 Mg-unit Tabs (Calcium carbonate-vitamin d) .... Take on tablet by mouth two times a day 12)  Multivitamins Tabs (Multiple vitamin) .Marland Kitchen.. 1 tab daily 13)  Vitamin D-1000 Max [address] [phone] Mg-unit Tabs (Calcium-vitamin d) ....  Take 1 tablet by mouth once a day  Other Orders: Future Orders: TLB-A1C / Hgb A1C (Glycohemoglobin) (83036-A1C) ... 08/21/2010  Patient Instructions: 1)  Today we updated your med list- see below.... 2)  We refilled your meds per request... 3)  We changfed the Lyrica to GABAPENTIN> take as directed at bedtime for the feet burning... 4)  Stay as active as possible... 5)  Call for any problmes... 6)  Please schedule a follow-up appointment in 6 months, & we will check FASTING blood work at that visit... Prescriptions: GABAPENTIN 100 MG CAPS (GABAPENTIN) take 1 cap by mouth at bedtime for 1wk, then 2 at bedtime for 1wk, then 3 at bedtime thereafter...  #100 x 6   Entered and Authorized by:   Michele Mcalpine MD   Signed by:   Michele Mcalpine MD on 08/20/2010   Method used:   Print then Give to Patient   RxID:   [id number] HYDROCODONE-ACETAMINOPHEN 5-500 MG  TABS (HYDROCODONE-ACETAMINOPHEN) 1 tab by mouth q6-8h as needed pain (not to exceed 3 per day)  #100 x 6   Entered and Authorized by:   Michele Mcalpine MD   Signed by:   Michele Mcalpine MD on 08/20/2010   Method used:   Print then Give to Patient   RxID:   [id number] ZANTAC 150 MG  TABS (RANITIDINE HCL) Take 1 tablet by mouth once a day  #90 x 4   Entered  and Authorized by:   Michele Mcalpine MD   Signed by:   Michele Mcalpine MD on 08/20/2010   Method used:   Print then Give to Patient   RxID:   [id number] PRAVASTATIN SODIUM 40 MG TABS (PRAVASTATIN SODIUM) take 1 tab by mouth at bedtime...  #90 x 4   Entered and Authorized by:   Michele Mcalpine MD   Signed by:   Michele Mcalpine MD on 08/20/2010   Method used:   Print then Give to Patient   RxID:   [id number] LASIX 20 MG  TABS (FUROSEMIDE) 1 tab by mouth once daily  #90 x 4   Entered and Authorized by:   Michele Mcalpine MD   Signed by:   Michele Mcalpine MD on 08/20/2010   Method used:   Print then Give to Patient   RxID:   [id number]    Orders Added: 1)  Est. Patient  Level IV [01027] 2)  TLB-Lipid Panel [80061-LIPID] 3)  TLB-BMP (Basic Metabolic Panel-BMET) [80048-METABOL] 4)  TLB-A1C / Hgb A1C (Glycohemoglobin) [83036-A1C]   Immunization History:  Influenza Immunization History:    Influenza:  historical (08/21/2010)   Immunization History:  Influenza Immunization History:    Influenza:  Historical (08/21/2010)

## 2010-09-03 ENCOUNTER — Encounter (INDEPENDENT_AMBULATORY_CARE_PROVIDER_SITE_OTHER): Payer: Medicare Other

## 2010-09-03 ENCOUNTER — Encounter: Payer: Self-pay | Admitting: Cardiology

## 2010-09-03 DIAGNOSIS — I8010 Phlebitis and thrombophlebitis of unspecified femoral vein: Secondary | ICD-10-CM

## 2010-09-03 DIAGNOSIS — Z7901 Long term (current) use of anticoagulants: Secondary | ICD-10-CM

## 2010-09-10 ENCOUNTER — Encounter: Payer: Self-pay | Admitting: Pulmonary Disease

## 2010-09-10 NOTE — Medication Information (Signed)
Summary: Coumadin Clinic   Anticoagulant Therapy  Managed by: Weston Brass, PharmD Referring MD: Charlies Constable MD PCP: dr Merry Proud MD: Shirlee Latch MD, Neshawn Aird Indication 1: Deep Vein Thrombosis - Leg (ICD-451.1) Lab Used: [facility] Site: [facility] INR POC 2.6 INR RANGE 2 - 3  Dietary changes: no    Health status changes: no    Bleeding/hemorrhagic complications: no    Recent/future hospitalizations: no    Any changes in medication regimen? no    Recent/future dental: yes     Details: Patient had 3 teeth pulled on 2/14 and he reports bleeding for about an hour afterward but no coumadin doses were held.    Any missed doses?: no       Is patient compliant with meds? yes       Allergies: No Known Drug Allergies  Anticoagulation Management History:      The patient is taking warfarin and comes in today for a routine follow up visit.  Positive risk factors for bleeding include an age of 75 years or older and presence of serious comorbidities.  The bleeding index is 'intermediate risk'.  Positive CHADS2 values include History of CHF, History of HTN, Age > 51 years old, and History of Diabetes.  The start date was 07/31/2000.  Anticoagulation responsible provider: Shirlee Latch MD, Anjelina Dung.  INR POC: 2.6.  Cuvette Lot#: [id number].  Exp: 07/2011.    Anticoagulation Management Assessment/Plan:      The patient's current anticoagulation dose is Coumadin 2 mg  tabs: as directed by the coumadin clinic.  The target INR is 2.0-3.0.  The next INR is due 10/01/2010.  Anticoagulation instructions were given to patient.  Results were reviewed/authorized by Weston Brass, PharmD.  He was notified by Margot Chimes PharmD Candidate.         Prior Anticoagulation Instructions: INR:  2.8 (goal 2-3)  Your INR is at goal today.  Please continue taking 4 mg everyday exept 2 mg on Fridays.  Return to clinic in 4 weeks for another INR check.    Current Anticoagulation Instructions: INR 2.6   Continue  to take 2 tablets daily except on fridays when you only take one tablet.  Recheck INR in 4 weeks.

## 2010-09-23 ENCOUNTER — Encounter: Payer: Self-pay | Admitting: Cardiology

## 2010-09-23 DIAGNOSIS — I82409 Acute embolism and thrombosis of unspecified deep veins of unspecified lower extremity: Secondary | ICD-10-CM

## 2010-09-25 NOTE — Letter (Signed)
Summary: [facility]  [facility]   Imported By: Lester Cordova 09/16/2010 09:09:48  _____________________________________________________________________  External Attachment:    Type:   Image     Comment:   External Document

## 2010-10-01 ENCOUNTER — Encounter: Payer: Self-pay | Admitting: Cardiology

## 2010-10-01 ENCOUNTER — Encounter (INDEPENDENT_AMBULATORY_CARE_PROVIDER_SITE_OTHER): Payer: Medicare Other

## 2010-10-01 DIAGNOSIS — I80299 Phlebitis and thrombophlebitis of other deep vessels of unspecified lower extremity: Secondary | ICD-10-CM

## 2010-10-01 DIAGNOSIS — Z7901 Long term (current) use of anticoagulants: Secondary | ICD-10-CM

## 2010-10-07 NOTE — Medication Information (Signed)
Summary: rov/pc      Allergies Added: NKDA Anticoagulant Therapy  Managed by: Samantha Crimes, PharmD Referring MD: Charlies Constable MD PCP: dr Merry Proud MD: Shirlee Latch MD, Dalton Indication 1: Deep Vein Thrombosis - Leg (ICD-451.1) Lab Used: [facility] Site: [facility] INR POC 2.5 INR RANGE 2 - 3  Dietary changes: no    Health status changes: no    Bleeding/hemorrhagic complications: no    Recent/future hospitalizations: no    Any changes in medication regimen? no    Recent/future dental: no  Any missed doses?: no       Is patient compliant with meds? yes       Current Medications (verified): 1)  Coumadin 2 Mg  Tabs (Warfarin Sodium) .... As Directed By The Coumadin Clinic 2)  Losartan Potassium 50 Mg Tabs (Losartan Potassium) .... Take One Tab By Mouth Once Daily 3)  Lasix 20 Mg  Tabs (Furosemide) .Marland Kitchen.. 1 Tab By Mouth Once Daily 4)  Pravastatin Sodium 40 Mg Tabs (Pravastatin Sodium) .... Take 1 Tab By Mouth At Bedtime.Marland KitchenMarland Kitchen 5)  Nexium 40 Mg Cpdr (Esomeprazole Magnesium) .... Take One Tablet By Mouth 30 Mins Prior To The First Meal of The Day 6)  Zantac 150 Mg  Tabs (Ranitidine Hcl) .... Take 1 Tablet By Mouth Once A Day 7)  Bentyl 20 Mg  Tabs (Dicyclomine Hcl) .Marland Kitchen.. 1 Tab By Mouth Three Times Daily As Needed Abd Cramping... 8)  Mobic 7.5 Mg Tabs (Meloxicam) .Marland Kitchen.. 1 By Mouth Once Daily As Needed For Arthritis 9)  Hydrocodone-Acetaminophen 5-500 Mg  Tabs (Hydrocodone-Acetaminophen) .Marland Kitchen.. 1 Tab By Mouth Q6-8h As Needed Pain (Not To Exceed 3 Per Day) 10)  Gabapentin 100 Mg Caps (Gabapentin) .... Take 1 Cap By Mouth At Bedtime For 1wk, Then 2 At Bedtime For 1wk, Then 3 At Bedtime Thereafter... 11)  Caltrate 600+d 600-400 Mg-Unit  Tabs (Calcium Carbonate-Vitamin D) .... Take On Tablet By Mouth Two Times A Day 12)  Multivitamins   Tabs (Multiple Vitamin) .Marland Kitchen.. 1 Tab Daily 13)  Vitamin D-1000 Max St [phone] Mg-Unit  Tabs (Calcium-Vitamin D) .... Take 1 Tablet By Mouth Once A  Day  Allergies (verified): No Known Drug Allergies  Anticoagulation Management History:      Positive risk factors for bleeding include an age of 79 years or older and presence of serious comorbidities.  The bleeding index is 'intermediate risk'.  Positive CHADS2 values include History of CHF, History of HTN, Age > 68 years old, and History of Diabetes.  The start date was 07/31/2000.  Anticoagulation responsible provider: Shirlee Latch MD, [place].  INR POC: 2.5.  Exp: 07/2011.    Anticoagulation Management Assessment/Plan:      The patient's current anticoagulation dose is Coumadin 2 mg  tabs: as directed by the coumadin clinic.  The target INR is 2.0-3.0.  The next INR is due 10/29/2010.  Anticoagulation instructions were given to patient.  Results were reviewed/authorized by Samantha Crimes, PharmD.         Prior Anticoagulation Instructions: INR 2.6   Continue to take 2 tablets daily except on fridays when you only take one tablet.  Recheck INR in 4 weeks.    Current Anticoagulation Instructions: Return to clinic in 4 weeks Cont with current regimen

## 2010-10-29 ENCOUNTER — Ambulatory Visit (INDEPENDENT_AMBULATORY_CARE_PROVIDER_SITE_OTHER): Payer: Medicare Other | Admitting: *Deleted

## 2010-10-29 DIAGNOSIS — I82409 Acute embolism and thrombosis of unspecified deep veins of unspecified lower extremity: Secondary | ICD-10-CM

## 2010-10-29 DIAGNOSIS — Z7901 Long term (current) use of anticoagulants: Secondary | ICD-10-CM

## 2010-10-29 LAB — POCT INR: INR: 3.2

## 2010-10-29 NOTE — Patient Instructions (Addendum)
INR 3.2  Coumadin2 mg tabs  Take only 1 Tab TODAY WED 4/11  Then take 2 tabs each day EXCEPT 1 tab on FRI Return in 4 weeks WED May 9 at [facility]

## 2010-11-26 ENCOUNTER — Ambulatory Visit (INDEPENDENT_AMBULATORY_CARE_PROVIDER_SITE_OTHER): Payer: Medicare Other | Admitting: *Deleted

## 2010-11-26 DIAGNOSIS — I82409 Acute embolism and thrombosis of unspecified deep veins of unspecified lower extremity: Secondary | ICD-10-CM

## 2010-11-26 LAB — POCT INR: INR: 2.8

## 2010-11-28 ENCOUNTER — Other Ambulatory Visit: Payer: Self-pay | Admitting: Cardiology

## 2010-12-02 NOTE — Assessment & Plan Note (Signed)
[facility]                            CARDIOLOGY OFFICE NOTE   NAME:[name], [name] CORKERY                        MRN:          [id number]  DATE:12/01/2007                            DOB:          Jul 13, 1923    PRIMARY CARE PHYSICIAN:  Lonzo Cloud. [name] Basque, MD   CLINICAL HISTORY:  Mr. [name] [name] is 77 years old and returns for followup  management of his nonischemic cardiomyopathy.  Mr. [name] [name] has documented  nonobstructive coronary disease and has had left ventricular dysfunction  with a pervious ejection fraction of 40-45% range.  He had been on a  beta blocker for his LV dysfunction, but this was stopped due to  bradycardia.  When I saw him last November, we did a repeat echo and his  ejection fraction was lower, and was estimated at 35-40%.  I had meant  to start him on an ACE inhibitor at that time, but apparently that did  not happen.  We did have him come back for earlier followup.  He said he  has been feeling well.  He has shortness of breath only with moderately  severe exertion and this has not changed.  He has had no chest pain or  palpitations.   PAST MEDICAL HISTORY:  Significant for deep venin thrombophlebitis for  which he takes Coumadin.  He also has had prostate cancer, which has  been treated with radiation therapy.  He has diabetes and  hyperlipidemia.  He has had abnormal liver functions on Zocor, so he is  not on a statin.   CURRENT MEDICATIONS:  1. Coumadin.  2. Lasix 20 mg daily.  3. Mobic.  4. Bentyl.  5. Promethazine.  6. Prevacid.   EXAMINATION:  The blood pressure is 136/79, pulse 72 and regular.  There  was no venous distension.  The carotid pulses were full without bruits.  CHEST:  Clear.  CARDIAC: Rhythm was regular.  I could hear no murmurs or gallops.  ABDOMEN:  Soft with normal bowel sounds.  Peripheral pulses were full.  There is no peripheral edema.   Electrocardiogram showed an intraventricular conduction delay and  nonspecific ST-T changes.   IMPRESSION:  1. Nonischemic cardiomyopathy with ejection fraction 35-40%.  2. Nonobstructive coronary disease at catheterization 2002.  3. 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  4. Sinus bradycardia requiring discontinuation of Toprol.  5. Prostate cancer status post radiation.  6. Diabetes.  7. Hyperlipidemia with abnormal liver function tests on Zocor.   RECOMMENDATIONS:  I think Mr. [name] [name] is doing well, although his left  ventricular dysfunction is slightly worse.  We will plan to add  lisinopril 10 mg daily.  He was on an ACE inhibitor in the remote past,  but does not remember any side effects from this.  We will plan to get a  BNP with the lab work that Dr. Kriste [name] does next month.  I will see him  back in a year or sooner if he has any recurrent problems.     Bruce Elvera Lennox Juanda Chance, MD, [facility]  Electronically Signed    BRB/MedQ  DD: 12/01/2007  DT: 12/01/2007  Job #: [id number]

## 2010-12-02 NOTE — Discharge Summary (Signed)
NAMEALLON, COSTLOW                 ACCOUNT NO.:  [id number]   MEDICAL RECORD NO.:  [id number]          PATIENT TYPE:  INP   LOCATION:  1615                         FACILITY:  [facility]   PHYSICIAN:  Madlyn Frankel. Charlann Boxer, M.D.  DATE OF BIRTH:  1923/02/01   DATE OF ADMISSION:  02/21/2007  DATE OF DISCHARGE:  02/24/2007                               DISCHARGE SUMMARY   ADMISSION DIAGNOSES:  1. Osteoarthritis.  2. Diabetes.  3. Hypertension.  4. Remote history DVT left lower extremity.  5. Hypercholesteremia.  6. GERD.   DISCHARGE DIAGNOSES:  1. Osteoarthritis.  2. Diabetes.  3. Hypertension.  4. Remote history DVT left lower extremity.  5. Hypercholesteremia.  6. GERD.   CONSULTANTS:  Pharmacy for Coumadin management.   PROCEDURE:  Left total knee arthroplasty.  Surgeon Dr. Durene Romans.  Assistant Dwyane Luo.   LABORATORY DATA:  CBC preadmission hematocrit 38.3.  Postop day #1 30,  postop day #2 30 and stable.  His platelets were 140.  Coagulation INR  preadmission or on admission INR 1.2, at time of discharge INR 2 and  therapeutic.  C-met potassium 5.4, glucose 116 preadmission.  Postop day  #1, potassium 4.4, glucose 116.  Postop day #2 glucose 129, potassium  4.3.  Kidney function normal.  Routine GI workup all within normal  limits.  UA showed small hemoglobin, some white blood cell casts.   RADIOLOGY:  Chest two-view showed no active disease.   CARDIOLOGY:  EKG shows sinus rhythm with occasional PVCs left bundle  branch block.   HOSPITAL COURSE:  The patient underwent left total knee arthroplasty.  Tolerated procedure well.  Was admitted to orthopedic floor.  Pain was  well-controlled throughout his course of stay.  He remained afebrile  throughout.  He was neurovascularly intact.  It was the left lower  extremity throughout.  DVT prophylaxis.  Coumadin was started  postoperatively and continued and was therapeutic at time of discharge.  He progressed nicely with this  physical therapy and was able to ambulate  200 feet with the use of a rolling walker with minimal assistance at  time of discharge.  Dressing was changed after postop day #1 on a daily  basis.  There was no active drainage from the wound, no sign of  infection or ooze.  Was ready for discharge postop day #3.   DISCHARGE DISPOSITION:  Stable in improved condition.  Discharged to  skilled nursing facility rehab.   DISCHARGE PHYSICAL THERAPY:  Goals of physical therapy to work on gait  training, proprioception, minimize pain, maximize strength, increase  range of motion, encourage independence in activities of daily living.  He will be use of rolling walker for 2 weeks, weightbearing as tolerated  and progression to single point cane.   DISCHARGE DIET:  Diabetic.   DISCHARGE WOUND CARE:  Keep wound dry.  Change dressing on a daily  basis.  The patient may shower if you are able to cover wound with  plastic or impervious subsurface and tape edges, thereby preventing  wound getting when until the time of follow-up with Dr.  Charlann Boxer.   DISCHARGE FOLLOWUP:  Follow with Dr. Charlann Boxer [phone] in two weeks for  wound check.   DISCHARGE MEDICATIONS:  1. Coumadin per pharmacy management, in the past, his Coumadin has      been 2 mg two p.o. q.h.s. daily.  2. Mobic 7.5 mg p.o. p.r.n.  3. Ranitidine 150 mg p.o. daily  4. Bentyl 20 mg one t.i.d. p.r.n..  5. Pravastatin 40 mg one p.o. q.h.s.  6. Requip 1 mg one p.o. q.h.s.  7. Glycosamine 0.125 mg one p.o. q.4 h p.r.n..  8. Robaxin 500 mg q.6 muscle spasm.  9. Colace 100 mg p.o. b.i.d.  10.MiraLax 17 grams p.o. daily.  11.Iron 325 mg p.o. t.i.d. times 2 weeks.  12.Vicodin 5/325 1-2 p.o. q.4-6 p.r.n. pain.  13.Enema of choice and laxative of choice.   DISCHARGE SPECIAL INSTRUCTIONS:  1. Wear TED hose on 12, off 12 hours.  2. Ice liberally.  3. Cover wound with plastic wrap and tape at shower.  4. If the patient develops acute shortness of breath,  severe calf      pain, call emergency services.     ______________________________  Yetta Glassman [address], [place]      Madlyn Frankel. Charlann Boxer, M.D.  Electronically Signed    BLM/MEDQ  D:  02/24/2007  T:  02/24/2007  Job:  [id number]

## 2010-12-02 NOTE — Assessment & Plan Note (Signed)
[facility]                            CARDIOLOGY OFFICE NOTE   NAME:Drummonds, CASTLE LAMONS                        MRN:          [id number]  DATE:05/25/2007                            DOB:          Jun 04, 1923    PRIMARY CARE PHYSICIAN:  Lonzo Cloud. Kriste Basque, MD   CLINICAL HISTORY:  Mr. Benning returns for a followup visit for  management of his non-ischemic cardiomyopathy. We had seen him in  consultation in July before a total knee replacement on the left which  was done by Dr. Barry Dienes. He did well since that time. We stopped his beta-  blocker at that time because his resting pulse rate was 50.   He has done fairly well recently and has had no recent chest pain,  shortness of breath or palpitations. His last echocardiogram was about a  year and a half ago at which time his ejection fraction was 40-45%. He  had an ejection fraction of 38% by his Myoview scan.   PAST MEDICAL HISTORY:  1. Non-obstructive disease at catheterization in 2002.  2. History of deep vein thrombophlebitis of the left lower extremity      for which he takes Coumadin.  3. He has had radiation treatment for prostate cancer.  4. He has diabetes.  5. Hyperlipidemia, but with abnormal liver function tests on Zocor.   PHYSICAL EXAMINATION:  Blood pressure was 131/76, pulse 76 and regular.  There was no venous distention. The carotid pulses were full without  bruits.  CHEST: Was clear without rales or rhonchi.  CARDIAC: Rhythm was regular. I hear no murmurs or gallops.  ABDOMEN: Was soft with normal bowel sounds. There was no  hepatosplenomegaly.  The peripheral pulses were full and there was no peripheral edema.   An electrocardiogram showed sinus rhythm with left bundle branch block  and left axis deviation.   IMPRESSION:  1. Non-ischemic cardiomyopathy with ejection fraction of 40-45% by      last echo and 38% by last Myoview scan. Improved from previously      30%.  2. Non-obstructive  coronary disease at catheterization in 2002.  3. History of deep vein thrombophlebitis of the left lower extremity,      on Coumadin therapy.  4. Sinus bradycardia, resulting in discontinuation of Toprol.  5. Prostate cancer, status post radiation.  6. Diabetes.  7. Hyperlipidemia with abnormal liver function tests on Zocor.   RECOMMENDATIONS:  I think Mr. Schumpert is doing well. He is off the beta-  blocker and he is not on ACE inhibitors despite his cardiomyopathy. Will  plan to reassess his left ventricular  function with an echocardiogram. If it is significantly depressed then  will put him on an ACE inhibitor. I will plan to see him back in a year.  Will get a BMP and BNP and CBC on him today.     Bruce Elvera Lennox Juanda Chance, MD, [facility]  Electronically Signed    BRB/MedQ  DD: 05/25/2007  DT: 05/25/2007  Job #: [id number]

## 2010-12-02 NOTE — Op Note (Signed)
NAMEVISHWA, DAIS NO.:  [id number]   MEDICAL RECORD NO.:  [id number]          PATIENT TYPE:  INP   LOCATION:  0098                         FACILITY:  [facility]   PHYSICIAN:  Madlyn Frankel. Charlann Boxer, M.D.  DATE OF BIRTH:  April 02, 1923   DATE OF PROCEDURE:  02/21/2007  DATE OF DISCHARGE:                               OPERATIVE REPORT   PREOPERATIVE DIAGNOSIS:  Left knee osteoarthritis.   POSTOPERATIVE DIAGNOSIS:  Left knee osteoarthritis.   PROCEDURE:  Left total knee replacement.   COMPONENTS USED:  DePuy rotating platform.  Posterior stabilized knee  system.  Size 5 femur, 5 tibia, 10 mm insert with 40 mm patellar button   SURGEON:  Madlyn Frankel. Charlann Boxer, M.D.   ASSISTANT:  Yetta Glassman. [address]oreta Ave, PA   ANESTHESIA:  General.   DRAINS:  None.   COMPLICATIONS:  None.   TOURNIQUET TIME:  60 minutes at 250 mmHg.   INDICATIONS FOR PROCEDURE:  Mr. Haddix is a longterm patient of mine, an  77 year old male with bilateral knee osteoarthritis.  He had failed  conservative measures, including multiple rounds of injections, and  viscosupplementation.  He finally was fed up with the conservative  efforts, and wished to proceed with surgical intervention.  He was  unable to do anything he enjoyed, like golfing and walking.  We reviewed  the risks and benefits of knee replacement surgery, including infection,  DVT, component failure, extensive need for therapy postoperatively as  well as the knee expectations.  Consent was obtained.   PROCEDURE IN DETAIL:  The patient was brought to the operative theater.  Once adequate anesthesia and preoperative antibiotics, Ancef, was  administered, the patient was positioned supine.  A proximal thigh  tourniquet was placed.  The left lower extremity was then prescrubbed,  then prepped and draped in the standard fashion.  A midline incision was  made, followed by a median parapatellar arthrotomy with patella  subluxation.  Following initial  debridement, attention was first  directed to the patella.  Precut measurement was 27 mm.  I resected back  to 15 mm and used the 41 patellar button -- with good bony coverage post  resection.  Then application of the trial button restored him back to 27  mm.   Attention was then directed to the femur.  The femoral canal was opened.  The canal was irrigated of any fat emboli.  I then placed the  intramedullary rod, and at 5 degrees of valgus I resected 11 mm of bone  off the distal femur -- due to the fact that the patient had a flexion  contracture.  I then sized the femur to be a size 5.  The anterior,  posterior and chamfer cuts were then all made out to the posterior  condylar as reference guide; which amounted to the same external  rotation as the perpendicular to the Whitelside's line.  I then finished  the femur with the trochlear box cut based off the lateral aspect of the  distal femur.  Attention was now directed to the tibia.  The patient's  tibia  was  pretty neutral, without significant deformity medial or  lateral.  For this reason, I chose to resect 6 mm of bone off the medial  side.  Following this cut I checked with the extension block and finally  the knee came up to full extension.  Given this, the extension block was  removed and final preparation of the tibia was carried out.  I used a  size 4 tibial tray.  This was then centered onto the tibia through the  medial third of the tubercle; drilled and keel punched.  I did check  with an alignment rod  and was happy that the alignment rod passed  through the center of the ankle in both planes.  A trial would actually  then carried with 5 femur, 4 tibia and a 5 mm insert size 10.  The knee  came out to  full extension.  The patella tracked  with out  application  of thumb pressure.   At this point all trial components were removed.  The posterior  osteophytes were debrided off of the femur.  The knee was then injected   with 0.25% Narcaine with epinephrine and Toradol.  The cement was mixed.  The final components were then cemented into position, and the knee  brought up to extension with a 10 mm insert.  Extruded cement was  removed.  Once the excess cement was removed and the cement had cured, I  examined the knee for any loose fragments of cement.  Once I was  satisfied I did not see any visual cement, the remaining of the size 5 x  10 mm insert was placed.  The knee was again irrigated and then 5 mL was  injected into the medial and lateral gutter.  The knee was then brought  to flexion with #1 Vicryl the extensor mechanism was reapproximated.  The main wound was closed with 2-0 Vicryl and running 4-0 Monocryl.  The  patient was then brought to recovery room, extubated in stable  condition.      Madlyn Frankel Charlann Boxer, M.D.  Electronically Signed     MDO/MEDQ  D:  02/21/2007  T:  02/21/2007  Job:  [id number]

## 2010-12-02 NOTE — H&P (Signed)
NAMEJAMAHL, LEMMONS                 ACCOUNT NO.:  [id number]   MEDICAL RECORD NO.:  [id number]         PATIENT TYPE:  LINP   LOCATION:                               FACILITY:  [facility]   PHYSICIAN:  Madlyn Frankel. Charlann Boxer, M.D.  DATE OF BIRTH:  Jun 02, 1923   DATE OF ADMISSION:  02/21/2007  DATE OF DISCHARGE:                              HISTORY & PHYSICAL   PROCEDURE:  Left total knee arthroplasty.   CHIEF COMPLAINT:  Left knee pain.   HISTORY OF PRESENT ILLNESS:  This is a 77 year old male with a history  persistent progressive left knee pain, secondary to osteoarthritis.  He  has been refractory to all conservative treatment.  He was pre-  surgically assessed by Dr. Kriste Basque.   PAST MEDICAL HISTORY:  Includes:  1. Osteoarthritis.  2. Diabetes.  3. Hypertension.  4. Remote history DVT, left lower extremity.  5. Hypercholesteremia.  6. GERD.   PAST SURGICAL HISTORY:  Includes none.   FAMILY MEDICAL HISTORY:  Heart disease, diabetes, cancer, arthritis.   SOCIAL HISTORY:  Married.  Wife will be primary caregiver after surgery.   DRUG ALLERGIES:  NO KNOWN DRUG ALLERGIES.   MEDICATIONS:  Include Coumadin, clonazepam, Zantac, Zocor, Lopressor,  Bentyl, Mobic and Vicodin p.r.n.  Unknown dosages.  Will bring  medications at time of admission.   REVIEW OF SYSTEMS:  None other than HPI.   PHYSICAL EXAMINATION:  VITAL SIGNS:  Pulse 60, respirations 18, blood  pressure 112/60.  GENERAL:  Awake, alert and oriented, well-developed, well-nourished, no  acute distress.  NECK:  Supple.  No carotid bruits.  CHEST/LUNGS:  Clear to auscultation bilaterally.  BREASTS:  Deferred.  HEART:  Regular rate and rhythm without gallops, clicks, rubs or  murmurs.  ABDOMEN:  Soft, nontender, nondistended.  Bowel sounds present.  GENITOURINARY:  Deferred.  EXTREMITIES:  Left knee swollen, dorsalis pedis pulse positive.  SKIN:  Intact.  No cellulitis.  NEUROLOGIC:  Intact distal sensibilities.   LABS/EKG/CHEST X-RAY:  All pending.   IMPRESSION:  1. Osteoarthritis.  2. Diabetes.  3. Hypertension.  4. History of DVT, left lower extremity.  5. Hypercholesteremia.  6. Gastroesophageal reflux disease.   PLAN OF ACTION:  Left total knee arthroplasty February 21, 2007, [facility] by surgeon Dr. Durene Romans.  Risks and complications were  discussed.  Questions were encouraged, answers reviewed.   No postoperative prescriptions given at time of history and physical.     ______________________________  Yetta Glassman. [address]ta [address], [place]      Madlyn Frankel. Charlann Boxer, M.D.  Electronically Signed    BLM/MEDQ  D:  02/11/2007  T:  02/11/2007  Job:  [id number]

## 2010-12-02 NOTE — Op Note (Signed)
[name] [name], [name] [name]                 ACCOUNT NO.:  [id number]   MEDICAL RECORD NO.:  [id number]          PATIENT TYPE:  OIB   LOCATION:  1604                         FACILITY:  [facility]   PHYSICIAN:  Madlyn Frankel. Charlann Boxer, M.D.  DATE OF BIRTH:  05-26-1923   DATE OF PROCEDURE:  04/21/2007  DATE OF DISCHARGE:                               OPERATIVE REPORT   PREOPERATIVE DIAGNOSIS:  Stiff painful left total knee replacement now  approximately eight weeks out.   POSTOPERATIVE DIAGNOSIS:  Stiff painful left total knee replacement now  approximately eight weeks out.   PROCEDURE:  1. Examination under anesthesia left knee.  2. Manipulation under anesthesia of left knee.   FINDINGS:  Preoperative findings indicated range of motion of 5 to maybe  80 degrees.  Post manipulation range of motion was 3 to 120 plus with a  nice lysis of adhesions that was fairly easy.   SURGEON:  Madlyn Frankel. Charlann Boxer, M.D.   ASSISTANT:  None.   ANESTHESIA:  MAC anesthesia plus a post operative femoral nerve block  carried out in the recovery room.   COMPLICATIONS:  None.   INDICATIONS FOR PROCEDURE:  [name] is an 77 year old patient of mine  with bilateral knee osteoarthritis status post a left total knee  replacement.  His recovery has somewhat difficult with limited  functional activity in therapy resulting in some stiffness and pain.  After the last visit in the office I had stated that if he is not  significantly improved range of motion manipulation would be indicated.  I reviewed with him the risks, benefits, complications of this type of  procedure.  Consent was obtained.   PROCEDURE IN DETAIL:  The patient was brought to operative theater.  Once adequate anesthesia was established the patient was positioned  supine on the operating table.  This included a paralytic as well as MAC  anesthesia.  Examination under anesthesia revealed the above-noted  findings of range of motion of 5 to maybe 80 degrees.   Incision is well-  healed, no effusion, no signs of infection.   Then manipulation with the hip flexed, I then applied pressure to the  proximal tibia holding the thighs stable.  There was a fairly easy lysis  of adhesions all way back to at least 110 degrees.  With pressure,  I  was able to further lyse some adhesions over the anterior aspect of the  knee and get him back to probably 130 degrees with some tension.   Following this manipulation, reexamination was carried out identifying  the range of motion noted.   He subsequently awoke from his anesthesia and brought to recovery in  stable condition.  In the recovery room he will get a femoral nerve  block.  He will stay overnight for extended observation, will put him in  CPM with range motion 0 to 100 with plans to increase to a max of 110.  He will immediately start therapy tomorrow or the next day.      Madlyn Frankel Charlann Boxer, M.D.  Electronically Signed     MDO/MEDQ  D:  04/21/2007  T:  04/22/2007  Job:  [id number]

## 2010-12-02 NOTE — Assessment & Plan Note (Signed)
[facility]                            CARDIOLOGY OFFICE NOTE   NAME:[name] [name], [name] [name]                        MRN:          [id number]  DATE:02/09/2007                            DOB:          1922-09-11    This is an 77 year old married white male patient who has a non-ischemic  cardiomyopathy, ejection fraction previously 30% which improved to 45%  by echo last year.  He is here today for surgical clearance for left  knee surgery to take place August 4 by Dr. Charlann Boxer.  Dr. Juanda Chance had cut his  Toprol back to 12.5 mg daily when he saw him last and ordered adenosine  Cardiolite which was performed on July 21.  The patient's baseline EKG,  ventricular rate was only 47 beats per minute.  He had no ischemia or  infarction and ejection fraction was 38% with global hypokinesis.  The  patient denies any dizziness or presyncope, chest pain, shortness of  breath.  He has had mild swelling of the left lower extremity from his  knee down, which is actually not noticeable.  The patient also takes  Coumadin for left lower extremity DVT 4 to 5 years ago and will need to  be bridged postop.   ALLERGIES:  No known drug allergies.   MEDICATIONS:  1. Coumadin.  2. __________ 25 mg daily.  3. Lasix 20 mg daily.  4. Mobic 20 mg daily.  5. Bentyl 20 mg daily.  6. Toprol at 25 mg daily.  7. Promethazine 25 mg daily.  8. Calcium 2 daily.  9. Requip daily.  10.Multivitamin daily.  11.Vitamin B daily.   PAST MEDICAL HISTORY:  Significant for non-obstructive coronary disease  on catheterization in 2002, DVT of the left lower extremity on Coumadin  for 4 to 5 years, DJD, status post radiation treatment for prostate  cancer, hyperlipidemia with abnormal liver function tests on Zocor.  Diabetes, diet controlled.  Right inguinal hernia surgery in December  2007.  Nonspecific abdominal pain approximately a year ago, said to have  irritable bowel syndrome with diverticulosis and  chronic abdominal pain  secondary to adhesions.  History of gastritis.   SOCIAL HISTORY:  He is married, lives with his wife, has no children.  He is a nonsmoker.   FAMILY HISTORY:  Is insignificant.   REVIEW OF SYSTEMS:  Negative for dizziness or presyncopal signs or  symptoms, dyspepsia, dysphagia, nausea, vomiting, change in bowels or  melena.  He does continue to have chronic abdominal pain secondary to  adhesions, but are pretty well controlled. He is able to golf twice a  week despite his cardiomyopathy and degenerative joint disease.   PHYSICAL EXAMINATION:  This is a very pleasant 77 year old white male in  no acute distress.  Blood pressure 130/68, pulse 50, weight 188.  NECK:  Without JVD, HJR, bruit or thyroid enlargement.  LUNGS:  Clear anterior, posterior and lateral.  HEART:  Regular rate and rhythm at 50 beats per minute.  Normal S1 and  S2.  No murmur, rub, bruit, thrill or heave noted.  ABDOMEN:  Soft  without organomegaly, masses __________ or abnormal  tenderness.  EXTREMITIES:  Without cyanosis, clubbing or edema.  He has good distal  pulses.   IMPRESSION:  1. Degenerative joint disease for left knee surgery August 4.  2. Non-ischemic cardiomyopathy, ejection fraction by echo initially      30%, improved to 45% but on adenosine Myoview ejection fraction      38%.  3. Non-obstructive coronary artery disease on catheterization in 2002.  4. Negative adenosine Cardiolite on February 07, 2007.  5. History of deep vein thrombosis of the left lower extremity, on      Coumadin.  6. Sinus bradycardia.  Will stop his Toprol.  7. Status post radiation treatment for prostate cancer.  8. Diabetes, diet controlled.  9. Hyperlipidemia with abnormal liver function tests on Zocor   PLAN:  At this time patient is at low to moderate risk for knee surgery.  He will need to be placed on Coumadin postop and they will need to watch  fluid status closely for signs of heart failure  and bradycardia as he  has been bradycardic.  We will stop his Toprol today.  He will see Dr.  Juanda Chance back in 3 months.      Jacolyn Reedy, PA-C  Electronically Signed      Jesse Sans. Daleen Squibb, MD, [facility]  Electronically Signed   ML/MedQ  DD: 02/09/2007  DT: 02/09/2007  Job #: [id number]   cc:   Lonzo Cloud. Kriste Basque, MD  Madlyn Frankel Charlann Boxer, M.D.

## 2010-12-04 ENCOUNTER — Other Ambulatory Visit: Payer: Self-pay | Admitting: *Deleted

## 2010-12-04 NOTE — Telephone Encounter (Signed)
Refill request received from [facility] on [address], [phone], for Lyrica. Our records show Lyrica was changed on 08/20/2010 to Gabapentin and this medication was sent to [facility] on [address]. Per pharmacist at [facility], they have not filled Lyrica since 07/29/2010. Need to ask the patient which medication he has been taking and if he needs a refill for Gabapentin. If this medication is not working for him, we will need to let SN know. LMOMTCB x 1.

## 2010-12-05 NOTE — Assessment & Plan Note (Signed)
[facility]                 COUMADIN / CHRONIC HEART FAILURE CLINIC NOTE   NAME:[name], [name] [name]                        MRN:          [id number]  DATE:06/14/2006                            DOB:          09-01-1922    The patient is scheduled for inguinal hernia repair on the right side by  Dr. Leonie Man on June 21, 2006. The patient has had a history of  DVT over 5 years ago and has been maintained on chronic anticoagulation  therapy since that time. The patient's care was discussed with Dr. Charlies Constable last evening, June 15, 2006 at 6 p.m. Based on review of  chart and the patient's risk/benefit analysis, the patient is cleared to  discontinue his warfarin 5 days prior to the pending inguinal hernia  surgery. He will call with questions or problems in the meantime. The  patient will be notified of this this morning by anticoagulation clinic  staff. He will be asked to resume his Coumadin as quickly as possible  thereafter and monitored in the meantime. This plan has been approved by  Dr. Charlies Constable.   addendum: The patient was notified via his wife this am at 1015 am.  They verbalize understanding of this plan.  Dr. Danella Penton office was  notified at 1030 am.      Shelby Dubin, PharmD, BCPS, CPP  Electronically Signed      Everardo Beals. Juanda Chance, MD, [facility]  Electronically Signed   MP/MedQ  DD: 06/16/2006  DT: 06/16/2006  Job #: [id number]

## 2010-12-05 NOTE — H&P (Signed)
NAMERYDELL, WIEGEL NO.:  [id number]   MEDICAL RECORD NO.:  [id number]          PATIENT TYPE:  INP   LOCATION:  1403                         FACILITY:  [facility]   PHYSICIAN:  Rachael Fee, MD   DATE OF BIRTH:  October 04, 1922   DATE OF ADMISSION:  05/10/2006  DATE OF DISCHARGE:                                HISTORY & PHYSICAL   CHIEF COMPLAINT:  Abdominal pain, nausea, vomiting, chills and sweats x2  weeks.   HISTORY:  [name] [name] is a pleasant 77 year old white male with a history of remote  cholecystectomy and the common bile duct stone extraction in 1993.  He is  status post ERCP and sphincterotomy with stone extraction per Dr. Russella Dar at  that time.  He also has a history of prostate CA and is status post  radiation seed implants.  He has had lumbar laminectomy x2, is status post  left knee repair, and then a left lower extremity DVT, and has been on  chronic Coumadin.  He is status post remote appendectomy and has history of  hypertension.   At this time, the patient has been ill over the past 2 weeks with upper  abdominal pain which has been constant, sharp in nature and nonradiating.  This has been associated with nausea, intermittent vomiting, poor appetite.  He also relates episodes of hot and cold spells and has been having chills  and sweats, but no documented fever.  His bowel movements have been normal.  He denies any melena or hematochezia.  Also relates that his urine has  appeared normal.  He has not noticed that he has been jaundiced.  He had  come to the emergency room on April 28, 2006 with the above symptoms.  LFTs done at that time showed a total bilirubin of 1.6, alkaline phosphatase  137, OT of 130, PT of 281.  CT of the abdomen and pelvis showed dilated  intrahepatic and extrahepatic ducts and air in the biliary tree.  He has  been using Vicodin, but this has not been relieving his symptoms.  He called  our office today and was advised to come  to the ER for further evaluation.   CURRENT MEDICATIONS:  1. Toprol XL 25 daily.  2. Zocor was stopped last week due to the LFTs per Dr. Kriste Basque.  3. Coumadin 4 mg p.o. daily.   ALLERGIES:  NO KNOWN DRUG ALLERGIES.   PAST HISTORY:  As outlined above.   SOCIAL HISTORY:  The patient is married, retired.  No tobacco, no EtOH.   FAMILY HISTORY:  Brother with throat cancer.  Father deceased with coronary  artery disease.   REVIEW OF SYSTEMS:  CARDIOVASCULAR:  Denies any chest pain or anginal  symptoms.  PULMONARY:  Denies cough, shortness of breath or sputum  production.  GENITOURINARY:  Denies any dysuria, urgency or frequency.  He  has not noticed any dark urine.  MUSCULOSKELETAL:  Positive for chronic left  knee discomfort.  GI:  As outlined above.  All the other systems reviewed  and negative.   PHYSICAL EXAMINATION:  GENERAL:  A well-developed white male in no acute  distress.  He is alert and oriented x3.  VITAL SIGNS:  Temperature is 97.8, blood pressure 115/71, pulse in the 70s.  Saturation is 97 on room air.  HEENT: Nontraumatic, normocephalic.  Extraocular movements intact.  Pupils  equal, round and reactive to light and accommodation.  Sclerae are slightly  icteric.  NECK:  Supple without nodes.  CARDIOVASCULAR:  Regular rate and rhythm with S1-S2.  There is no murmur,  rub or gallop.  PULMONARY:  Clear to auscultation and percussion.  ABDOMEN:  Soft.  He is mildly tender across the upper abdomen.  There is no  guarding or rebound.  No mass or hepatosplenomegaly.  He does have  incisional scars.  RECTAL:  Exam is not done at this time.  EXTREMITIES:  Without clubbing or cyanosis.  The left lower extremity is  slightly larger than the right to the knee.  NEUROLOGIC:  Nonfocal.  SKIN:  Very slightly jaundiced.   LABORATORY DATA:  Pending at this time.   IMPRESSION:  78. A 77 year old white male with a 2-week history of upper abdominal pain,      intermittent nausea,  vomiting, chills, sweats.  With previously-      documented abnormal LFTs and a dilated biliary tree on CT scan, must      rule out choledocholithiasis, common bile duct stricture or occult      lesion, all with secondary cholangitis.  2. Chronic anticoagulation secondary to history of deep vein thrombosis.  3. Hypertension.  4. History of prostate carcinoma.  5. Status post cholecystectomy in 1919, and common bile duct stone      extraction in 1993 with ERCP and sphincterotomy.  6. Status post appendectomy.  7. Laminectomy x2.  8. Left knee repair.   PLAN:  The patient is admitted to the service of Dr. Wendall [name] for IV fluid  hydration.  He will be covered with IV Unasyn.  Will check baseline labs,  hold his Coumadin and reverse his INR.  Cover with Lovenox and plan ERCP  with possible stent and sphincterotomy on May 11, 2006.  For details,  please see the orders.     ______________________________  Mike Gip, PA-C    ______________________________  Rachael Fee, MD    AE/MEDQ  D:  05/10/2006  T:  05/11/2006  Job:  [id number]

## 2010-12-05 NOTE — Assessment & Plan Note (Signed)
[facility]                            CARDIOLOGY OFFICE NOTE   NAME:[name] [name], [name] [name]                        MRN:          [id number]  DATE:11/03/2006                            DOB:          23-Jun-1923    PRIMARY CARE PHYSICIAN:  Alroy Dust   CLINICAL HISTORY:  [name] [name] is 77 years old and has a non-ischemic  cardiomyopathy with an ejection fraction of previously 30% which  improved to  45%  by echo last year.  He also has non-obstructive  disease at catheterization. He says he has been doing quite well, has  had no symptoms of chest pain, shortness of breath or palpitations.   PAST MEDICAL HISTORY:  Is significant for diabetes, which is currently  controlled on diet.  He also has hyperlipidemia, although he said he had  elevated liver function tests on Zocor.  He is on chronic Coumadin  therapy for history of deep vein thrombophlebitis.  He also has a  history of prostate cancer and multiple operations.   CURRENT MEDICATIONS:  Include:  Coumadin, tramadol, Lasix, Mobic,  Bentyl, Toprol, promethazine, Nexium, calcium.   EXAMINATION:  The blood pressure is 142/69, the pulse 45 and regular.  There was no venous distention. The carotid pulses were full without  bruits.  CHEST:  Was clear without rales or rhonchi.  The heart rhythm was regular.  No murmurs or gallops.  ABDOMEN:  Was soft, normal bowel sounds.  There is no  hepatosplenomegaly.  There was trace peripheral edema. The pedal pulses were equal.   An ECG showed sinus bradycardia at a rate of 45 and left bundle branch  block with left axis deviation.   IMPRESSION:  1. Non-ischemic cardiomyopathy with ejection fraction improved from      30% to 45% by last echo.  2. Non-obstructive coronary disease.  3. Hyperlipidemia, with abnormal liver function tests on Zocor.  4. Diabetes, apparently diet controlled.  5. History of deep vein thrombophlebitis, currently on Coumadin      therapy.  6. Degenerative joint disease.  7. Status post radiation treatment for prostate cancer.   RECOMMENDATIONS:  [name] [name] pulse is quite slow, I think we should  cut back on his  beta blocker so we will give him Toprol 25 mg, have him  take a half a tablet, to cut him back to 12.5 tablets daily.  He never  got on the enalapril last year but since his function has improved so  much, I think I will hold off on that for now.  I think it is important  to be on a statin and we  probably need to get by with a generic drug, so we will try on him  Pravastatin 40 a day and get another lipid and liver profile in about 6  weeks.  I will plan on seeing him back in a year.     Bruce Elvera Lennox Juanda Chance, MD, [facility]  Electronically Signed    BRB/MedQ  DD: 11/03/2006  DT: 11/03/2006  Job #: [id number]

## 2010-12-05 NOTE — Assessment & Plan Note (Signed)
[facility]                           GASTROENTEROLOGY OFFICE NOTE   NAME:[name] [name], [name] [name]                        MRN:          [id number]  DATE:05/28/2006                            DOB:          [birth year]    [name] [name] returns from his hospitalization from October 22 to May 14, 2006, at which time he had cholangitis and had common bile duct stones  extracted per ERCP by Dr. Christella Hartigan.  He no longer has right upper quadrant  pain, fever, chills, nausea or vomiting.  He has a chronic underlying left-  sided abdominal pain, which has been present for many years.  He currently  is using codeine, which I would advise against.  His pain seems to be  related to increasing constipation with gas and bloating.  It is relieved by  having a bowel movement.  He does not take fiber supplements, but uses  p.r.n. MiraLax almost daily.  His appetite is good and his weight is stable.  He denies specific genitourinary or other GI complaints.  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  He is chronically on Coumadin therapy.   He recently had repeat CT scan of the abdomen that showed atherosclerosis of  the aorta, but no aneurysm, and otherwise was normal, except for a benign  right hepatic cyst, previous cholecystectomy.   He currently is on multiple medications, including Coumadin, Tramadol,  Lasix, Mobic, Zantac, Bentyl, Toprol, amoxicillin and promethazine.  None of  these doses are available today, since the patient did not bring his  medications for review.   He is a healthy-appearing white male, in no acute distress.  He appears his  stated age.  He is 5 feet 10 inches tall and weighs 191 pounds.  Blood  pressure is 110/64 and pulse was 64 and regular.  I could not appreciate  scleral icterus or other stigmata of chronic liver disease.  His chest was  clear and there were no murmurs, gallops  or rubs noted on cardiac exam.  He  appears to be in a regular rhythm.  I could not appreciate  hepatosplenomegaly, abdominal masses or tenderness.  The bowel sounds were  normal.  Peripheral extremities were unremarkable.  Mental status was clear.  Rectal exam was deferred.   ASSESSMENT:  1. Status post endoscopic sphincterotomy times two with extraction of      common bile duct stones in patient who is status post cholecystectomy.  2. Chronic left-sided abdominal pain, associated with probable      diverticulosis and chronic constipation.  3. History of previous prostate cancer with seed implantation.  4. Adult-onset diabetes mellitus, which is apparently dietary controlled.  5. History of low back surgery with previous laminectomies.   RECOMMENDATIONS:  1. Repeat CBC and hepatic profile.  2. High-fiber diet with daily Benefiber and we will try Pamine 2 mg every      12 hours twice a day.  3. Followup colonoscopic exam, which has not been done in several years.  4. Avoid  narcotic dependency, if at all possible.  The patient may need      referral to a pain management center.     Vania Rea. Jarold Motto, MD, [facility] Essex, FAGA  Electronically Signed    DRP/MedQ  DD: 05/28/2006  DT: 05/28/2006  Job #: [id number]   cc:   Rachael Fee, MD  Lonzo Cloud. Kriste Basque, MD

## 2010-12-05 NOTE — Discharge Summary (Signed)
[name] [name], [name] [name] NO.:  [id number]   MEDICAL RECORD NO.:  [id number]          PATIENT TYPE:  INP   LOCATION:  1403                         FACILITY:  [facility]   PHYSICIAN:  Rachael Fee, MD   DATE OF BIRTH:  [birth year]   DATE OF ADMISSION:  05/10/2006  DATE OF DISCHARGE:  05/14/2006                                 DISCHARGE SUMMARY   ADMITTING DIAGNOSES:  1. An 77 year old male with a 2-week history of upper abdominal pain,      intermittent nausea, vomiting, chills, and sweats with previously      documented abnormal liver function tests and dilated biliary tree on      computed tomography scan, must rule out choledocholithiasis, common      bile duct stricture, or occult lesion, all with secondary cholangitis.  2. Chronic anticoagulation secondary to history of deep venous thrombosis.  3. Hypertension.  4. History of prostate CA.  5. Status post remote cholecystectomy, and then common bile duct stone      extraction in 1993 with sphincterotomy.  6. Status post appendectomy.  7. Status post laminectomy x2.  8. Status post left knee repair.   DISCHARGE DIAGNOSES:  1. An 77 year old male, stable status post endoscopic retrograde      cholangiopancreatography and stone extraction, May 11, 2006,      negative evaluation for choledochocolonic fistula, with improvement in      liver function tests.  2. Persistent complaints of abdominal pain of unclear etiology, with some      chronic component, not felt related to the biliary tract.  3. Adult onset diabetes mellitus.  4. History of deep venous thrombosis, on chronic anticoagulation   CONSULTATIONS:  None.   PROCEDURES:  1. CT scan of the abdomen and pelvis.  2. ERCP, with stone extraction and sphincterotomy.  3. HIDA scan.   BRIEF HISTORY:  [name] [name] is a pleasant 77 year old white male with history of  remote cholecystectomy and then common bile duct stone extraction in 1993.  He also has history of  prostate CA, is status post radiation seed implants,  status post remote laminectomy, left knee repair, and has had a left lower  extremity DVT for which he has been placed on chronic Coumadin.  He has  history of hypertension and remote appendectomy.  At the time of admission,  he has been ill over the past 2 weeks, with complaints of upper abdominal  pain which is described as constant, sharp in nature, and nonradiating,  associated with nausea, intermittent vomiting, and a poor appetite.  He has  been having hot and cold spells at home, but no documented fever.  His bowel  movements have been normal, without melena or hematochezia.  He had not  noticed any evidence of jaundice or dark urine.  He came to the emergency  room on April 28, 2006 with the above complaints.  LFTs were done showing  a total bilirubin of 1.6, alkaline phosphatase  of 137, SGOT 130, and SGPT  2881.  Subsequent CT of the abdomen showed dilated intra- and extrahepatic  ducts and air in the biliary tree. He was discharged with Vicodin to use for  pain.  He called our office on the day of admission and was advised to come  to the ER for further evaluation.  He is seen and evaluated and felt to be  acutely ill with possible cholangitis, and admitted to the hospital for  treatment and further diagnostic evaluation.   LABORATORY STUDIES:  On May 10, 2006, WBC of 5.8, hemoglobin 12.5,  hematocrit of 36.1, MCV of 92.9, platelets 271.  Serial values were obtained  on May 14, 2006, WBC of 3.6, hemoglobin 11.1, hematocrit of 32.  On  May 10, 2006, pro time was 21.9, INR of 1.8, PTT of 43.  On May 11, 2006, pro time 18.3, INR of 1.5, PTT of 48.  Electrolytes within normal  limits.  Glucose 132, BUN 16, creatinine 1, albumin 3.1 on admission.  LFTs  on admission:  Total bilirubin of 2.7, alkaline phosphatase 355, ALT of 235,  AST of 104. Amylase and lipase were normal. On May 14, 2006, total  bilirubin  was 1.1, alk phos 215, ALT of 118, and AST of 49, lipase of 10.  CT scan of the abdomen and pelvis on May 10, 2006 shows chronic biliary  ductal dilation and air and gas in the biliary tree. No acute lesion  evident. Could not exclude a biliary stricture and/or cholangitis. Multiple  seed implants surrounding the prostate bed. No worrisome lymph nodes. HIDA  scan on May 12, 2006 shows no evidence for cholecolonic or  choledochocolonic fistula.   HOSPITAL COURSE:  The patient was admitted to the service of Dr. Rob Bunting who was covering the hospital.  He was placed on IV fluids, given  Zofran and morphine as needed for control of pain and nausea, started on IV  Unasyn.  We held his Coumadin, and he was scheduled for ERCP the following  day with Dr. Christella Hartigan.  It was felt that he probably had a cholangitis  accounting for his symptoms.  ERCP was successful.  He was found to have a  common bile duct stones which were removed, and sphincterotomy and balloon  sweeping were done.  At the time of that exam, there was question of biliary  colic fistula, no evidence of purulence.  The patient tolerated the  procedure well. The following day, he continued to complain of pain.  His  LFTs were improved.  He was continued on IV antibiotics, given Dilaudid as  needed for pain.  We did check a HIDA scan to rule out the choledochocolonic  fistula, and this was negative.  His LFTs continued to improve, though the  patient still complained of pain. On further questioning, he had said that  he had been having some of this epigastric pain over the past year or so.  He did seem to be able to eat without increase in his pain and was able to  be up and ambulating.  He was converted to p.o. antibiotics, restarted on  his Coumadin, and on May 14, 2006, though he continued to have pain, he  was otherwise stable with significantly improved LFTs, again was eating without difficulty, having no fever.   It was felt that perhaps a component  of his pain was chronic and not related to his common bile duct stones or  elevated LFTs.  It was felt that he was stable for discharge to home, with  followup with Dr. Jarold Motto on May 25, 2006 at 9:45 a.m., and to call  for any problems in the interim.  It was felt that he may need an MRI of his  back to rule out radicular component to his pain see if it did not improve.   MEDICATIONS ON DISCHARGE:  1. Toprol XL 25 daily.  2. Coumadin 4 mg daily.  3. Zegerid 20 daily.  4. Darvocet-N 100, 1 q.4-6 h. as needed for pain.  5. Augmentin 500 b.i.d. x7 days.      Mike Gip, PA-C      Rachael Fee, MD  Electronically Signed   AE/MEDQ  D:  05/27/2006  T:  05/27/2006  Job:  [id number]

## 2010-12-05 NOTE — Discharge Summary (Signed)
[place]  Patient:    [name] [name], [name] [name]                        MRN: [id number] Adm. Date:  [id number] Disc. Date: [id number] Attending:  Verdene Lennert                           Discharge Summary  DATE OF BIRTH:  Jul 25, 1922  FINAL DIAGNOSES: 1. Deep venous thrombosis left leg after [place] trip to the beach with positive    Dopplers showing clot in the mid calf through the common femoral vein. 2. History of irritable bowel syndrome and diverticulosis with chronic    abdominal pain secondary to adhesions. 3. History of gastritis. 4. History of prostate cancer with previous radium seed implants, followed by    Dr. Vonita Moss. 5. History of degenerative arthritis with previous left knee surgery and low    back pain with lumbar disk surgery. 6. History of bilateral inguinal hernia repair and umbilical hernia repair. 7. Status post cholecystectomy and appendectomy.  BRIEF HISTORY AND PHYSICAL:  The patient is a 77 year old white gentleman who presented to the office with pain and swelling in his left calf of one weeks duration.  A recent trip to [facility] and [place] to play golf, noticed some swelling during this excursion.  He had pain in the posterior calf over the several days prior to admission with increased pain with walking and weightbearing.  He noticed some swelling in the leg which seemed to go down overnight.  He noticed a little bluish discoloration in his foot.  On presentation to the office, he had obvious swelling in the left leg which was larger than the right, and he was sent to the Doppler lab where venous Dopplers were positive for DVT in the left mid calf to his left common femoral vein.  He was admitted for heparin and Coumadin therapy.  Past history includes irritable bowel syndrome and diverticulosis with pain secondary to adhesions and is followed by gastroenterology.  He also has a history of gastritis which he  takes Prevacid.  He has a history of prostate cancer and had radium seed implants by Dr. Vonita Moss.  He has a history of degenerative arthritis with previous left knee surgery by Dr. Criss Alvine and a previous lumbar laminectomy and disk surgery for low back pain as well.  He had a history of bilateral inguinal hernia surgery and umbilical hernia repair.  He has had previous cholecystectomy and appendectomy.  Physical examination at the time of admission revealed a 77 year old gentleman appearing younger than his stated age.  Blood pressure 128/64, pulse 84 and regular, respirations 60 per minute and nonlabored, temperature 98 degrees. He weighed 204 lbs.  HEENT exam unremarkable.  Neck exam showed no jugular venous distension and carotids 2+ bilaterally without bruits.  There was thyromegaly or lymphadenopathy.  Chest exam was clear to auscultation and percussion without wheezes, rales, or rhonchi heard.  Cardiac exam revealed a normal S1, S2 without murmur, rub or gallop heard.  Abdomen was soft and nontender without evidence of organomegaly or masses.  Extremities showed some obvious swelling in the left leg which measured 16.5 inches around the mid calf compared to 15.5 on the other side.  Pulses were intact.  Neurologic exam was intact without focal abnormalities detected.  LABORATORY DATA:  As noted, Dopplers at the [facility] Doppler Lab showed DVT  from the mid calf through the common femoral vein on the left; greater saphenous vein appeared patent.  Chest x-ray showed some mild bibasilar scarring, no active disease.  An EKG showed a normal sinus rhythm, a left bundle branch block, and nonspecific ST-T wave changes.  Hemoglobin 13.1, hematocrit 38.7, white count 6000 with normal differential.  Platelet count was 236,000.  Sed rate was 19.  Protime 13.9, INR 1.1, PTT 29 seconds on admission.  Serial PT and PTTs were followed throughout his hospital course.  Sodium 137, potassium 4.3, chloride  102, CO2 29, BUN 16, creatinine 1.1, calcium 9.8, total protein 7.0, albumin 33.5, AST 29, ALT 19, alkaline phosphatase 69, total bilirubin 0.6, blood sugar 127, TSH 1.2, PSA 0.4.  HOSPITAL COURSE:  The patient was admitted to general medical ward and placed on IV heparin by pharmacy protocol with Coumadin commencing immediately.  He was covered with Protonix, given Tylenol with Darvocet for pain.  He had an uneventful hospital course.  PTTs and PTs were monitored serially by the pharmacy team, and he remained well anticoagulated with decrease in swelling noted.  Had some mild abdominal discomfort.  Some Levbid was ordered as needed for his irritable bowel syndrome.  On July 31, 2000, his protime was 22.9 seconds with an INR of 2.8.  Heparin was discontinued, and the decision was made to discharge him home on Coumadin at 4 mg a day with follow-up next week in the office.  DISCHARGE MEDICATIONS: 1. Coumadin 2 mg tablets, take 2 tablets each day in the afternoon. 2. Prevacid 30 mg p.o. q.d. 3. Darvocet-N 100 1 every 4-6 hours as needed for pain. 4. He may continue his irritable bowel medications at home.  He is instructed to rest at home with gradual increase in activities.  Keep the salt out of his diet and elevate his legs and use support hose as much as possible.  He was given an appointment for follow-up with Dr. Kriste Basque and to be introduced to the [facility] on August 05, 2000, at 12 noon.  CONDITION ON DISCHARGE:  Improved. DD:  07/31/00 TD:  08/01/00 Job: [id number]

## 2010-12-05 NOTE — Cardiovascular Report (Signed)
. [name]  Patient:    [name] [name], [name] [name] Visit Number: [id number] MRN: [id number]          Service Type: CAT Location: [facility] 2855 [id number] Attending Physician:  Lenoria Farrier Dictated by:   Everardo Beals Juanda Chance, M.D. [facility] Proc. Date: 06/14/01 Admit Date:  06/14/2001   CC:         [name] [name]. Kriste Basque, M.D. [facility]  Cardiac Catheterization Lab   Cardiac Catheterization  PROCEDURE:  Right and left heart catheterization.  CLINICAL HISTORY:  [name] is 77 years old and has no prior history of known heart disease.  He developed symptoms of dyspnea on exertion about six weeks ago and had an echocardiogram performed by Dr. Kriste Basque which was interpreted showing an ejection fraction of 25%.  His shortness of breath subsequently resolved and he was started on Altace and referred to me for evaluation.  We made arrangements for him to come in the hospital for angiography to exclude an ischemic cause for his cardiomyopathy.  DESCRIPTION OF PROCEDURE:  Right heart catheterization was performed percutaneously via the right femoral vein using a venous sheath and Swan-Ganz thermodilution catheter.  Left heart catheterization was performed percutaneously via the right femoral artery using a arterial sheath and 6-French preformed coronary catheters.  A front wall arterial puncture was performed.  Omnipaque contrast was used.  The right femoral artery was closed with Perclose at the end of the procedure.  The patient tolerated the procedure well and left the laboratory in satisfactory condition.  RESULTS: 1. The left main coronary artery:  The left main coronary artery had a 30%    ostial stenosis. 2. The left anterior descending artery:  The left anterior descending    artery gave rise to two diagonal branches and a septal perforator.  There    was 50% proximal stenosis and 30% narrowing in the mid vessel with    irregularities in the proximal and mid vessel. 3. The  circumflex artery:  The circumflex artery gave rise to two small    marginal branches and three posterolateral branches.  The circumflex was    irregular but there was no major obstruction. 4. The right coronary artery:  The right coronary artery was a moderate sized    vessel that gave rise to an acute marginal branch which supplied part of    the inferior septum, posterior descending, and two posterolateral branches.    There were irregularities in the right coronary artery but no significant    obstruction.  Left ventriculogram:  The left ventriculogram, performed in the RAO projection, showed global hypokinesis with an estimated ejection fraction of 40%.  HEMODYNAMIC DATA:  The right atrial pressure was 7 mean.  The right ventricular pressure was 36/9.  The pulmonary artery pressure was 36/9 with a mean of 22.  Pulmonary wedge pressure was 12 mean.  Left ventricular pressure was 123/16.  The aortic pressure was 123/62.  The cardiac output/cardiac index by thermodilution was 5.2/2.5 liters/minute/sq m.  CONCLUSION:  Nonobstructive coronary artery disease with 30% narrowing of the left main coronary artery, 50% proximal, and 30% mid stenosis in the left anterior descending artery, irregularities in the circumflex and right coronary artery, and global hypokinesis with an estimated ejection fraction of 40%.  RECOMMENDATIONS:  The patient does not have significant obstructive coronary disease explaining his left ventricular dysfunction.  I believe he has nonischemic cardiomyopathy of uncertain etiology.  His LV function looks better by angiography than by echocardiogram,  and we will plan continued medical management with treatment with ACE inhibitors. Dictated by:   Everardo Beals Juanda Chance, M.D. [facility] Attending Physician:  Lenoria Farrier DD:  06/14/01 TD:  06/14/01 Job: [id number]

## 2010-12-05 NOTE — Op Note (Signed)
[name] [name], [name] [name]                 ACCOUNT NO.:  [id number]   MEDICAL RECORD NO.:  [id number]          PATIENT TYPE:  AMB   LOCATION:  [facility]                         FACILITY:  [facility]   PHYSICIAN:  Leonie Man, M.D.   DATE OF BIRTH:  1923-05-02   DATE OF PROCEDURE:  06/21/2006  DATE OF DISCHARGE:                               OPERATIVE REPORT   PREOPERATIVE DIAGNOSIS:  Recurrent right inguinal hernia.   POSTOPERATIVE DIAGNOSIS:  Recurrent right inguinal hernia.   PROCEDURES:  Repair right inguinal hernia with mesh (PHS large).   [name] [name] is an 77 year old gentleman who underwent right-sided  inguinal hernia repair in the remote past.  He returns now with an  enlarging right-sided inguinal hernia which has become symptomatic.  He  comes to the operating room for repair after risks and potential  benefits of surgery have been discussed.  All questions answered,  consent obtained.   PROCEDURE:  The patient positioned supinely. Following induction of  satisfactory general anesthesia the right groin is prepped and draped to  be included in a sterile operative field.  A right-sided groin incision  is carried down obliquely through the old scar cicatrix, deepened  through skin and subcutaneous tissue down to the external oblique  aponeurosis.  Scarring over the external oblique aponeurosis was excised  and discarded.  The external inguinal ring was opened up through the  external inguinal canal.  I did not identify any ilioinguinal nerve.  The spermatic cord was mobilized and held with a Penrose drain.  The  anterior and medial aspect of the cord there was then noticed to be a  moderately large indirect inguinal hernia which was dissected free from  the spermatic cord and carried up to the internal ring.  The sac was  then suture ligated at its base and the redundant sac amputated and  discarded.  I dissected down into the internal inguinal canal so as to  free up the retroperitoneal  space fully down to the pubic tubercle,  inserted a Prolene hernia mesh system (PHS) into the retroperitoneum and  deployed it in all of four planes so as to get coverage of the entire  hernia  space.  The external portion of the mesh was brought up into the  inguinal canal and deployed in the inguinal canal.  The mesh was then  trimmed and cut so as to accommodate the spermatic cord.  The mesh was  sewn in place with a running 2-0 Novofil suture from the pubic tubercle  up to the internal ring and again from the pubic tubercle along the  shelving edge of Poupart's ligament up to the internal ring.  The tails  of mesh were deployed behind the spermatic cord over the internal  oblique muscles.  The repair was noted to be intact.  All areas of  dissection checked for hemostasis and found to be dry.  Sponge,  instrument and sharp counts were verified.  External oblique aponeurosis  closed over the cord with a running 2-0 Vicryl suture and subcutaneous  tissues and  Scarpa's fascia closed with  running 3-0 Vicryl suture and the skin was  closed with running 4-0 Monocryl suture and then reinforced with Steri-  Strips.  Sterile dressings were applied.  The anesthetic reversed and  the patient removed from the operating room to the recovery room in  stable condition.  He tolerated the procedure well.      Leonie Man, M.D.  Electronically Signed     PB/MEDQ  D:  06/21/2006  T:  06/21/2006  Job:  [id number]   cc:   Lonzo Cloud. Kriste Basque, MD  [address]  [place]  [place] [address]

## 2010-12-05 NOTE — Op Note (Signed)
[place]  Patient:    [name] [name], [name] [name] Visit Number: [id number] MRN: [id number]          Service Type: EMS Location: [facility] Attending Physician:  Devoria Albe Proc. Date: 07/23/98 Admit Date:  06/19/2001 Discharge Date: 06/20/2001   CC:         Lorin Picket M. Kriste Basque, M.D. [facility]             Wynn Banker, M.D.                           Operative Report  PREOPERATIVE DIAGNOSIS:  Prostatic carcinoma.  POSTOPERATIVE DIAGNOSIS:  Prostatic carcinoma.  OPERATION:  Radioactive seed implantation in the prostate and cystoscopy.  SURGEON:  Maretta Bees. Vonita Moss, M.D.  ASSISTANT:  Dr. Kathrin Greathouse.  INDICATIONS:  This gentleman was found to have a PSA of 5.7 and a Gleason grade 6 carcinoma of the prostate and was worked up and counselled about therapy and he  opted for radioactive seed implantation of the prostate.  DESCRIPTION OF PROCEDURE:  The patient is brought to the operating room and placed in lithotomy position.  Foley catheter and rectal tube was inserted.  The transrectal ultrasound probe was then inserted and images correlated with the preoperative simulation studies.  There was good correlation on the ultrasound images and the base of the prostate was determined and stabilizing needles were  placed in position.  Then under fluoroscopic and ultrasonic control, he had placement of 84 seeds of radioactive Iodine 125 by means of 21 separate needles. Seed placement was quite good and at this point, the transrectal ultrasound probe was removed.  He was cystoscoped and there were no seeds in the prostatic urethra of the bladder.  Foley catheter is inserted and patient taken to the recovery room in good condition. Attending Physician:  Devoria Albe DD:  07/23/98 TD:  07/24/98 Job: [id number]

## 2010-12-12 ENCOUNTER — Telehealth: Payer: Self-pay | Admitting: Pulmonary Disease

## 2010-12-12 MED ORDER — PREGABALIN 75 MG PO CAPS: ORAL_CAPSULE | ORAL | Status: DC

## 2010-12-12 NOTE — Telephone Encounter (Signed)
The other med would be Lyrica which he asked Dr. Kriste Basque  To change previously because he thought it  Made him gain weight.  So does he want to go back on Lyrica , if so can switch to 75mg  2 at bedtime #60, 5 refills  Will need to keep ov with Dr. Kriste Basque  In august  Please contact office for sooner follow up if symptoms do not improve or worsen or seek emergency care

## 2010-12-12 NOTE — Telephone Encounter (Signed)
Spoke w/ pt and he states he wants to go back on lyrica and is aware rx will be called into pharmacy. Pt also aware to stop the gabapentin and to keep ov w/ sn in august. Nothing further was needed

## 2010-12-12 NOTE — Telephone Encounter (Signed)
Lyrica refilled by [facility] per Tammy Parrett.

## 2010-12-12 NOTE — Telephone Encounter (Signed)
Spoke w/ pt wife and she states pt has been taking Gabapentin 100 mg and states this medication makes pt have extreme hot flashes. Pt wife is wanting to know if pt can be changed to something else. Wife states he has to keep the A/C running constantly b/c he is so hot. Please advise Dr. Kriste Basque. Thanks  KNDA  Carver Fila, CMA

## 2010-12-17 ENCOUNTER — Telehealth: Payer: Self-pay | Admitting: Pulmonary Disease

## 2010-12-17 NOTE — Telephone Encounter (Signed)
Please advise if you have these forms. Carron Curie, CMA

## 2010-12-22 ENCOUNTER — Encounter: Payer: Self-pay | Admitting: Cardiology

## 2010-12-22 NOTE — Telephone Encounter (Signed)
Attempted to call [facility] and they are now closed.  Will try back again tomorrow.  i have not seen any of these forms for this pt.

## 2010-12-23 NOTE — Telephone Encounter (Signed)
Called, spoke with [name] [name].  States she received the rx signed by SN yesterday.  Nothing further needed at this time.

## 2010-12-24 ENCOUNTER — Ambulatory Visit (INDEPENDENT_AMBULATORY_CARE_PROVIDER_SITE_OTHER): Payer: Medicare Other | Admitting: *Deleted

## 2010-12-24 DIAGNOSIS — I82409 Acute embolism and thrombosis of unspecified deep veins of unspecified lower extremity: Secondary | ICD-10-CM

## 2010-12-24 LAB — POCT INR: INR: 2.5

## 2011-01-12 ENCOUNTER — Encounter: Payer: Self-pay | Admitting: Cardiology

## 2011-01-12 ENCOUNTER — Ambulatory Visit (INDEPENDENT_AMBULATORY_CARE_PROVIDER_SITE_OTHER): Payer: Medicare Other | Admitting: Cardiology

## 2011-01-12 ENCOUNTER — Ambulatory Visit (INDEPENDENT_AMBULATORY_CARE_PROVIDER_SITE_OTHER): Payer: Medicare Other | Admitting: *Deleted

## 2011-01-12 DIAGNOSIS — Z01818 Encounter for other preprocedural examination: Secondary | ICD-10-CM

## 2011-01-12 DIAGNOSIS — I5022 Chronic systolic (congestive) heart failure: Secondary | ICD-10-CM

## 2011-01-12 DIAGNOSIS — I82409 Acute embolism and thrombosis of unspecified deep veins of unspecified lower extremity: Secondary | ICD-10-CM

## 2011-01-12 DIAGNOSIS — I519 Heart disease, unspecified: Secondary | ICD-10-CM

## 2011-01-12 LAB — POCT INR: INR: 2.4

## 2011-01-12 MED ORDER — CARVEDILOL 3.125 MG PO TABS: 3.1250 mg | ORAL_TABLET | Freq: Two times a day (BID) | ORAL | Status: DC

## 2011-01-12 NOTE — Patient Instructions (Signed)
Your physician recommends that you schedule a follow-up appointment in: 3 months with DR [facility]  Your physician recommends that you continue on your current medications as directed. Please refer to the Current Medication list given to you today.START CARVEDILOL 3.125 MG 1 TWICE DAILY   Your physician has requested that you have an echocardiogram. Echocardiography is a painless test that uses sound waves to create images of your heart. It provides your doctor with information about the size and shape of your heart and how well your heart's chambers and valves are working. This procedure takes approximately one hour. There are no restrictions for this procedure. PT'S CONVENIENCE

## 2011-01-13 NOTE — Assessment & Plan Note (Addendum)
Long history of nonischemic cardiomyopathy.  Last EF 30-35%.  NYHA class II symptoms (more limited by knee pain) and appears euvolemic on current Lasix regimen.   - Continue losartan - Start coreg 3.125 mg bid.  He is not significantly bradycardic today. Will follow.  - Given IVCD, CRT may be an option => given age would not place ICD.   - Followup in 3 months.  - Echo for LV systolic function.

## 2011-01-13 NOTE — Progress Notes (Addendum)
PCP: Dr. Kriste Basque  77 yo with history of nonischemic cardiomyopathy presents for cardiology followup.  Last echo was in 12/10 with EF 30-35%.  He has not been on a beta blocker due to bradycardia, but HR is 76 today.  He golfs twice weekly and walks 15 minutes on a treadmill two times a day without dyspnea.  He can climb a flight of steps without dyspnea.  No chest pain.  Main complaint is right knee pain from osteoarthritis.  Steroid shots are no longer helping much.  He has talked to Dr. Charlann Boxer about right TKR under spinal anesthesia.     ECG: NSR, IVCD, left axis deviation  Labs (2/12): K 4.4, creatinine 1.0, LDL 86, HDL 55  PMH: 1. Nonischemic CMP: [facility] (2002) with nonobstructive CAD.  Echo (12/10): EF 30-35%, diffuse hypokinesis worse in the inferior and posterior walls, mild AI, mild MR.  2. H/o appendectomy 3. H/o DVT left leg, on chronic warfarin 4. IBS 5. H/o cholecystectomy 6. ERCP with sphincterotomy and stone removal 7. Prostate cancer: seed implantation.  8. Hernia repair 9. Left TKR 10. Diabetes mellitus: Diet-controlled 11. Hyperlipidemia  SH: Married, no children.  Quit smoking.  Rare ETOH.   FH: Father with CAD.  ROS: All systems reviewed and negative except as per HPI.   Current Outpatient Prescriptions  Medication Sig Dispense Refill  . Calcium Carbonate-Vitamin D (CALTRATE 600+D) 600-400 MG-UNIT per tablet Take 1 tablet by mouth 2 (two) times daily.        . Cholecalciferol (VITAMIN D-1000 MAX ST) 1000 UNITS tablet Take 1,000 Units by mouth daily.        Marland Kitchen dicyclomine (BENTYL) 20 MG tablet Take 20 mg by mouth 3 (three) times daily as needed.        Marland Kitchen esomeprazole (NEXIUM) 40 MG capsule Take 40 mg by mouth daily before breakfast. 30 min before first meal of day.       . furosemide (LASIX) 20 MG tablet Take 20 mg by mouth daily.        Marland Kitchen gabapentin (NEURONTIN) 100 MG capsule Take 100 mg by mouth. Take 3 caps at bedtime       . HYDROcodone-acetaminophen (VICODIN) 5-500  MG per tablet Take 1 tablet by mouth every 6 (six) hours as needed.        Marland Kitchen losartan (COZAAR) 50 MG tablet TAKE 1 TABLET BY MOUTH EVERY DAY  30 tablet  6  . meloxicam (MOBIC) 7.5 MG tablet Take 7.5 mg by mouth daily as needed. For arthritis       . Multiple Vitamin (MULTIVITAMIN) tablet Take 1 tablet by mouth daily.        . pravastatin (PRAVACHOL) 40 MG tablet Take 40 mg by mouth at bedtime.        . ranitidine (ZANTAC) 150 MG tablet Take 150 mg by mouth daily.        Marland Kitchen warfarin (COUMADIN) 2 MG tablet Take by mouth as directed.        . carvedilol (COREG) 3.125 MG tablet Take 1 tablet (3.125 mg total) by mouth 2 (two) times daily with a meal.  60 tablet  11  . DISCONTD: gabapentin (NEURONTIN) 100 MG capsule Take 100 mg by mouth at bedtime. Take 1 at bedtime for 1 week, then 2 at bedtime for 1 week, etc etc...        BP 122/70  Pulse 76  Resp 16  Ht 5\' 10"  (1.778 m)  Wt 193 lb (87.544 kg)  BMI 27.69 kg/m2 General: elderly, NAD Neck: No JVD, no thyromegaly or thyroid nodule.  Lungs: Clear to auscultation bilaterally with normal respiratory effort. CV: Nondisplaced PMI.  Heart regular S1/S2, no S3/S4, 1/6 SEM.  No peripheral edema.  No carotid bruit.  Normal pedal pulses.  Abdomen: Soft, nontender, no hepatosplenomegaly, no distention.  Neurologic: Alert and oriented x 3.  Psych: Normal affect. Extremities: No clubbing or cyanosis.

## 2011-01-13 NOTE — Assessment & Plan Note (Signed)
Patient is significantly limited by right knee pain. He has talked to Dr. Charlann Boxer about this is and apparently he could have the operation by spinal anesthesia rather than general.  He appears euvolemic today with minimal exertional dyspnea. He can climb a flight of steps without problem.  No chest pain.  His cardiac risk from surgery is increased, but it is not prohibitive as he is stable clinically. I am going to start a beta blocker (Coreg 3.125 mg bid)  which would need to be continued peri-operatively.  Stress testing would be unlikely to benefit him so will not do this.

## 2011-01-19 ENCOUNTER — Telehealth: Payer: Self-pay | Admitting: Pulmonary Disease

## 2011-01-19 DIAGNOSIS — I5022 Chronic systolic (congestive) heart failure: Secondary | ICD-10-CM

## 2011-01-19 MED ORDER — LOSARTAN POTASSIUM 50 MG PO TABS: 50.0000 mg | ORAL_TABLET | Freq: Every day | ORAL | Status: DC

## 2011-01-19 MED ORDER — CARVEDILOL 3.125 MG PO TABS: 3.1250 mg | ORAL_TABLET | Freq: Two times a day (BID) | ORAL | Status: DC

## 2011-01-19 NOTE — Telephone Encounter (Signed)
Lmomtcb.  Need to know if pt wants to pick RX up or have this mailed to his home. RX printed and placed on SN cart for signature. Pls give back to triage to await pt callback.

## 2011-01-20 NOTE — Telephone Encounter (Signed)
PATIENT'S POWER WAS OFF.  PATIENT'S WIFE HAS ASKED THAT WE MAIL PRESCRIPTIONS.

## 2011-01-20 NOTE — Telephone Encounter (Signed)
ATC NA and no option to leave a msg, WCB.  

## 2011-01-20 NOTE — Telephone Encounter (Signed)
PATIENT PICKED UP PRESCRIPTIONS

## 2011-01-28 ENCOUNTER — Ambulatory Visit (HOSPITAL_COMMUNITY): Payer: Medicare Other | Attending: Cardiology | Admitting: Radiology

## 2011-01-28 DIAGNOSIS — E119 Type 2 diabetes mellitus without complications: Secondary | ICD-10-CM | POA: Insufficient documentation

## 2011-01-28 DIAGNOSIS — I079 Rheumatic tricuspid valve disease, unspecified: Secondary | ICD-10-CM | POA: Insufficient documentation

## 2011-01-28 DIAGNOSIS — I080 Rheumatic disorders of both mitral and aortic valves: Secondary | ICD-10-CM | POA: Insufficient documentation

## 2011-01-28 DIAGNOSIS — E785 Hyperlipidemia, unspecified: Secondary | ICD-10-CM | POA: Insufficient documentation

## 2011-01-28 DIAGNOSIS — I519 Heart disease, unspecified: Secondary | ICD-10-CM

## 2011-01-28 DIAGNOSIS — I509 Heart failure, unspecified: Secondary | ICD-10-CM | POA: Insufficient documentation

## 2011-01-28 HISTORY — PX: TRANSTHORACIC ECHOCARDIOGRAM: SHX275

## 2011-02-05 ENCOUNTER — Other Ambulatory Visit: Payer: Self-pay | Admitting: Pulmonary Disease

## 2011-02-05 DIAGNOSIS — I5022 Chronic systolic (congestive) heart failure: Secondary | ICD-10-CM

## 2011-02-05 MED ORDER — LOSARTAN POTASSIUM 50 MG PO TABS: 50.0000 mg | ORAL_TABLET | Freq: Every day | ORAL | Status: DC

## 2011-02-05 MED ORDER — CARVEDILOL 3.125 MG PO TABS: 3.1250 mg | ORAL_TABLET | Freq: Two times a day (BID) | ORAL | Status: DC

## 2011-02-05 MED ORDER — HYDROCODONE-ACETAMINOPHEN 5-500 MG PO TABS: 1.0000 | ORAL_TABLET | Freq: Four times a day (QID) | ORAL | Status: DC | PRN

## 2011-02-05 NOTE — Telephone Encounter (Signed)
PATIENT HAD ECHO AND WAS GIVEN RESULT YESTERDAY.  HE SAID HIS HEART IS BEATING AT 25% AND WANTS TO KNOW IF THAT IS GOOD OR BAD

## 2011-02-05 NOTE — Telephone Encounter (Signed)
Pt's spouse aware prescriptions are ready for pick up and they have been placed up front.

## 2011-02-05 NOTE — Telephone Encounter (Signed)
ATC pt x 2. NA. WCB.  Prescriptions placed up front for pt to pick up.

## 2011-02-11 ENCOUNTER — Ambulatory Visit (INDEPENDENT_AMBULATORY_CARE_PROVIDER_SITE_OTHER): Payer: Medicare Other | Admitting: *Deleted

## 2011-02-11 ENCOUNTER — Encounter: Payer: Medicare Other | Admitting: *Deleted

## 2011-02-11 DIAGNOSIS — I82409 Acute embolism and thrombosis of unspecified deep veins of unspecified lower extremity: Secondary | ICD-10-CM

## 2011-02-11 LAB — POCT INR: INR: 3.4

## 2011-02-18 ENCOUNTER — Ambulatory Visit (INDEPENDENT_AMBULATORY_CARE_PROVIDER_SITE_OTHER): Payer: Medicare Other | Admitting: Pulmonary Disease

## 2011-02-18 ENCOUNTER — Encounter: Payer: Self-pay | Admitting: Pulmonary Disease

## 2011-02-18 DIAGNOSIS — K219 Gastro-esophageal reflux disease without esophagitis: Secondary | ICD-10-CM

## 2011-02-18 DIAGNOSIS — E785 Hyperlipidemia, unspecified: Secondary | ICD-10-CM

## 2011-02-18 DIAGNOSIS — G609 Hereditary and idiopathic neuropathy, unspecified: Secondary | ICD-10-CM

## 2011-02-18 DIAGNOSIS — I10 Essential (primary) hypertension: Secondary | ICD-10-CM

## 2011-02-18 DIAGNOSIS — I2510 Atherosclerotic heart disease of native coronary artery without angina pectoris: Secondary | ICD-10-CM

## 2011-02-18 DIAGNOSIS — K589 Irritable bowel syndrome without diarrhea: Secondary | ICD-10-CM

## 2011-02-18 DIAGNOSIS — E119 Type 2 diabetes mellitus without complications: Secondary | ICD-10-CM

## 2011-02-18 DIAGNOSIS — I428 Other cardiomyopathies: Secondary | ICD-10-CM

## 2011-02-18 DIAGNOSIS — K5730 Diverticulosis of large intestine without perforation or abscess without bleeding: Secondary | ICD-10-CM

## 2011-02-18 DIAGNOSIS — I82409 Acute embolism and thrombosis of unspecified deep veins of unspecified lower extremity: Secondary | ICD-10-CM

## 2011-02-18 DIAGNOSIS — I447 Left bundle-branch block, unspecified: Secondary | ICD-10-CM

## 2011-02-18 DIAGNOSIS — M1990 Unspecified osteoarthritis, unspecified site: Secondary | ICD-10-CM

## 2011-02-18 DIAGNOSIS — F419 Anxiety disorder, unspecified: Secondary | ICD-10-CM

## 2011-02-18 MED ORDER — PREGABALIN 75 MG PO CAPS: 75.0000 mg | ORAL_CAPSULE | Freq: Every day | ORAL | Status: DC

## 2011-02-18 MED ORDER — PREGABALIN 75 MG PO CAPS: 75.0000 mg | ORAL_CAPSULE | Freq: Three times a day (TID) | ORAL | Status: DC

## 2011-02-18 NOTE — Patient Instructions (Signed)
Today we updated your med list in EPIC...  Continue your current meds the same...  Please return one morning this week for your follow up fasting blood work...    Then please call the [facility]REE in a few days for your results...    Dial [id number] & when prompted enter your patient number followed by the # symbol...    Your patient number is:  [id number]#  Stay as active as poss & keep up the good work w/ golfing, treadmill, & bike...  Call for any questions...  Let's plan a routine follow up in 6 months, sooner if needed for problems.Marland KitchenMarland Kitchen

## 2011-02-18 NOTE — Progress Notes (Signed)
Subjective:    Patient ID: [name] [name], male    DOB: 1922-11-22, 77 y.o.   MRN: [id number]  HPI 77 y/o WM w/ mult med problems here for a follow up OV...    ~  February 19, 2010:  Overall stable w/ CC= his arthritis/ DJD> pain in knees & neck... he saw DrBBrodie 6/11> stable but c/o dizzy on Lisinopril, therefore changed to Ramipril but his head felt funny, so now on  LOSARTAN, not on BBlocker due to brady... BS controlled on diet + exercise... Chol OK on Prav40... PSA remains "zero" s/p seed implants... he gets all his meds filled at [facility].  ~  August 20, 2010:  He's had a good 42mo overall- CC= arthritis & he is holding off on right TKR from DrOlin per DrBBrodie's rec against surg;  he's been taking Lyrica ~Qod for "feet burning"/ neuropathy symptoms but doesn't like it cause he thinks it's making him gain weight> we discussed change to Gabapentin 100-200-300mg  Qhs to see if this is more agreeable to him.    He notes BS at home incr to ~145 ave & we will recheck FBS & A1c to see if he needs to start meds;  he wants to be sure he gets his permission slip signed for his free diabetic shoes.    He saw DrBB 12/11 for Cards f/u> doing well w/ his non-ischemic cardiomyopathy, EF= 30-35%, & no changes made; he will be following up w/ DrMcLean; he had some leg swelling & [facility] which were [facility] neg- pt on Coumadin via the CC for hx DVT.  ~  February 18, 2011:  42mo ROV & he is c/o knee pain & nocturnal leg cramps for which he takes mustard;  He didn't like the Gabapentins- states it gave him hot flashes, and he prefers the [facility]...    He saw DrMcLean for Cards f/u 6/12> non-ischemic cardiomyopathy & he is quite active w/ golfing & treadmill, no CP but c/o knee pain etc;  He started BBlocker COREG 3.125mg  Bid & watching for any bradycardia (pt has already decreased this to 1/2 tab Bid "it made mu head feel funny");  f/u 2DEcho showed dilated LV w/ EF=25% & diffuse HK, dil LA, triv  AI noted;  EKG w/ NSR, IVCD, NSSTTWA...   1.  Non-obstructive CAD, Cardiomyopathy, LBBB - on LOSARTAN 50mg /d,  LASIX 20mg /d, and now COREG 3.125mg - 1/2 Bid... followed by Dr Shirlee Latch for Cards... his prev Toprol was stopped secondary to bradycardia, and he has prev refused to take Vasotec, c/o dizzy from Lisinopril, & head felt funny on Altace. ~  baseline EKG= NSR, LBBB... ~  2DEcho 11/08 showed global HK, dil LV, EF=35-40%, mild AR/ MR...  ~  labs 6/09 showed BUN=20, Creat= 1.1, K= 4.8, BNP= 57... ~  2DEcho 5/10 showed LV mod dil w/ diffuse HK & EF= 30-35%, mild MR & AI, dil atria & incr PAsys=48. ~  Pre-op eval 12/10 by BB w/ repeat echo (no change)- pt states he advised against the ortho surg. ~  6/12:  Cards eval from DrMcLean> 2DEcho w/ EF=25% & diffuse HK; Coreg added to his regimen...  2.  Hx DVT - he continues on Coumadin and followed in the coumadin clinic...  3.  DM - he's on diet alone... he reports recent incr in his BS ave at home to ~145 range. ~  labs 2/08 showed BS= 128, HgA1c = 6.3 ~  labs 6/09 showed  BS= 118, HgA1c= 6.3 ~  he saw DrGroat 2/10 for eye check- no retinopathy... ~  labs 8/10 showed BS= 109, A1c= 6.3 ~  labs 8/11 showed BS= 112, A1c=6.7 ~  labs 2/12 showed BS= 116, A1c= 6.7.Marland KitchenMarland Kitchen OK to continue diet Rx.  4.  Chol - on PRAVASTATIN 40 and tolerating this well... prev had trouble w/ Zocor- increased LFTs. ~  FLP 5/08 showed TChol 212, TG 77, HDL 75, LDL 108 ~  FLP 6/09 showed TChol 180, TG 80, HDL 54, LDL 110 ~  FLP 2/10 showed TChol 226, TG 43, HDL 65, LDL 148... he declined to incr the Prav to 80. ~  FLP 8/10 showed TChol 187, TG 87, HDL 52, LDL 87 ~  FLP 8/11 showed TChol 176, TG 87, HDL 56, LDL 102 ~  FLP 2/12 showed TChol 157, TG 82, HDL 55, LDL 86  5.  DJD - he had a Left TKR by DrOlin 8/08... he went to the [facility] post op to recoup, then back home> after that his wife fell and broke her hip...  now having incr difficulty w/ right knee and had shots  from ortho... he takes VICODIN Tid Prn,  MOBIC 7.5mg  Prn, along w/ Calcium, MVI, & Vit D... he's been on LYRICA 75mg  Prn (ave Qod) for neuropathy in feet> he was intol to Gabapentin w/ "hot flashes" ~  DrOlin planned right TKR 1/11- pre-op eval from Cards & pt reports they advised against surg by DrBB. ~  2/12:  c/o weight gain on Lyrica (~Qod) for neuropathy; rec ch to Gabapentin 100>200>300mg  Qhs as trial, but subseq intol as above.  6. GERD &  IBS - on PREVACID 30mg /d, & Zantac 150mg HS, Bentyl 20mg Tid Prn which really helps his IBS symptoms. ~  2/10:  c/o incr reflux & peptic symptoms... advised start PREVACID 30mg /d in AM & take Zantac HS...   7.  PROSTATE CANCER - followed by DrPeterson/ [place]ahl[place]t & treated w/ radium seeds in 2000... ~  labs 2/10 showed PSA= 0.02 ~  labs 8/11 showed PSA= 0.02  8.  SHINGLES? - he states that he "caught" shingles from a guy he was sitting next to at a restaurant & broke out on his leg 2-3d later... he went to see Derm= DrLeschin & treated...   Past Surgical History  Procedure Date  . Appendectomy   . Cholecystectomy     1985 by Dr. Orpah Greek  . Knee arthroscopy     L knee. 1991 by Dr. Criss Alvine  . Ercp with sphincterotomy and stone removal     5/93 by Dr. Russella Dar  . Bilat inguinal hernia repair     1998 by Dr. Samuella Cota  . Radium seed implantation for prostate cancer     s/p. 1/00 by Dr. Vonita Moss and Dan Humphreys  . R cataract surgery     2004 by Dr. Elmer Picker  . Repair of right recurrent right inguinal hernia     12/07 by Dr. Lurene Shadow  . Basal cell cancer     L ear. s/p Moh's surgery 3/09 Dr. Alean Rinne  . Total knee arthroplasty     Left. 8/08 by Dr. Charlann Boxer    Outpatient Encounter Prescriptions as of 02/18/2011  Medication Sig Dispense Refill  . Calcium Carbonate-Vitamin D (CALTRATE 600+D) 600-400 MG-UNIT per tablet Take 1 tablet by mouth 2 (two) times daily.        . carvedilol (COREG) 3.125 MG tablet Take 1/2 tablet by mouth two times daily per Dr. Shirlee Latch       .  Cholecalciferol (VITAMIN D-1000 MAX ST) 1000 UNITS tablet Take 1,000 Units by mouth daily.        Marland Kitchen dicyclomine (BENTYL) 20 MG tablet Take 20 mg by mouth 3 (three) times daily as needed.        Marland Kitchen esomeprazole (NEXIUM) 40 MG capsule Take 40 mg by mouth daily before breakfast. 30 min before first meal of day.       . furosemide (LASIX) 20 MG tablet Take 20 mg by mouth daily.        Marland Kitchen HYDROcodone-acetaminophen (VICODIN) 5-500 MG per tablet Take 1 tablet by mouth every 6 (six) hours as needed.  100 tablet  5  . losartan (COZAAR) 50 MG tablet Take 1 tablet (50 mg total) by mouth daily.  90 tablet  3  . meloxicam (MOBIC) 7.5 MG tablet Take 7.5 mg by mouth daily as needed. For arthritis       . Multiple Vitamin (MULTIVITAMIN) tablet Take 1 tablet by mouth daily.        . pravastatin (PRAVACHOL) 40 MG tablet Take 40 mg by mouth at bedtime.        . ranitidine (ZANTAC) 150 MG tablet Take 150 mg by mouth daily.        Marland Kitchen warfarin (COUMADIN) 2 MG tablet Take by mouth as directed.        . pregabalin (LYRICA) 75 MG capsule Take 1 capsule (75 mg total) by mouth daily.  30 capsule  11  . DISCONTD: gabapentin (NEURONTIN) 100 MG capsule Take 100 mg by mouth. Take 3 caps at bedtime         No Known Allergies   Current Medications, Allergies, Past Medical History, Past Surgical History, Family History, and Social History were reviewed in [employer] record.    Review of Systems    See HPI - all other systems neg except as noted...  The patient complains of dyspnea on exertion.  The patient denies anorexia, fever, weight loss, weight gain, vision loss, decreased hearing, hoarseness, chest pain, syncope, peripheral edema, prolonged cough, headaches, hemoptysis, abdominal pain, melena, hematochezia, severe indigestion/heartburn, hematuria, incontinence, muscle weakness, suspicious skin lesions, transient blindness, difficulty walking, depression, unusual weight change, abnormal bleeding,  enlarged lymph nodes, and angioedema.     Objective:   Physical Exam    WD, WN, 85 WM in NAD... GENERAL:  Alert & oriented; pleasant & cooperative... HEENT:  Mina/AT, EOM-wnl, PERRLA, EACs-clear, TMs-wnl, NOSE-clear, THROAT-clear & wnl. NECK:  Supple w/ fair ROM; no JVD; normal carotid impulses w/o bruits; no thyromegaly or nodules palpated; no lymphadenopathy. CHEST:  Clear to P & A; without wheezes/ rales/ or rhonchi. HEART:  Regular Rhythm;  Gr 1/6 SEM w/o rubs or gallops. ABDOMEN:  Soft & nontender; normal bowel sounds; no organomegaly or masses detected. EXT:  s/p left TKR, mod-severe arthritic changes; mild venous insuffic changes, no signif edema. NEURO:  CN's intact; motor testing normal; sensory testing equivocal;  gait abnormal, balance fair. DERM:  No lesions noted; no rash etc...   Assessment & Plan:   Non-obstructive CAD, Cardiomyopathy w/ EF=25%, LBBB>  Followed by DrMcLean & his note reviewed;  Tried on COREG & dose has been decr to 1/2 of the 3.125mg  tabs Bid...  Hx DVT>  He remains on Coumadin via the CC...  CHOL>  On Prav40 & tolerating this reasonably well...  DM>  Controlled on diet alone & reminded to avoid sugars & sweets...  GI> GERD, IBS>  Stable on meds, continue same.Marland KitchenMarland Kitchen  Prostate Cancer>  follwed by DrDahlstadt & stable as above...  DJD>  This is his CC & DrOlin is considering surg if cleared by [facility] for Cards... Continue Mobic, Vicodin, Lyrica, etc..Marland Kitchen

## 2011-02-23 ENCOUNTER — Encounter: Payer: Self-pay | Admitting: Pulmonary Disease

## 2011-02-23 ENCOUNTER — Other Ambulatory Visit (INDEPENDENT_AMBULATORY_CARE_PROVIDER_SITE_OTHER): Payer: Medicare Other

## 2011-02-23 DIAGNOSIS — E119 Type 2 diabetes mellitus without complications: Secondary | ICD-10-CM

## 2011-02-23 DIAGNOSIS — I82409 Acute embolism and thrombosis of unspecified deep veins of unspecified lower extremity: Secondary | ICD-10-CM

## 2011-02-23 DIAGNOSIS — F411 Generalized anxiety disorder: Secondary | ICD-10-CM

## 2011-02-23 DIAGNOSIS — I10 Essential (primary) hypertension: Secondary | ICD-10-CM

## 2011-02-23 DIAGNOSIS — N139 Obstructive and reflux uropathy, unspecified: Secondary | ICD-10-CM

## 2011-02-23 DIAGNOSIS — F419 Anxiety disorder, unspecified: Secondary | ICD-10-CM

## 2011-02-23 DIAGNOSIS — E785 Hyperlipidemia, unspecified: Secondary | ICD-10-CM

## 2011-02-23 LAB — HEPATIC FUNCTION PANEL
Alkaline Phosphatase: 43 U/L (ref 39–117)
Bilirubin, Direct: 0.1 mg/dL (ref 0.0–0.3)
Total Protein: 6.8 g/dL (ref 6.0–8.3)

## 2011-02-23 LAB — CBC WITH DIFFERENTIAL/PLATELET
Basophils Absolute: 0 10*3/uL (ref 0.0–0.1)
Eosinophils Absolute: 0.1 10*3/uL (ref 0.0–0.7)
HCT: 36.5 % — ABNORMAL LOW (ref 39.0–52.0)
Hemoglobin: 12.4 g/dL — ABNORMAL LOW (ref 13.0–17.0)
Lymphs Abs: 1.6 10*3/uL (ref 0.7–4.0)
MCHC: 34.1 g/dL (ref 30.0–36.0)
Neutro Abs: 3.1 10*3/uL (ref 1.4–7.7)
RDW: 14.6 % (ref 11.5–14.6)

## 2011-02-23 LAB — BASIC METABOLIC PANEL
BUN: 25 mg/dL — ABNORMAL HIGH (ref 6–23)
Calcium: 9.2 mg/dL (ref 8.4–10.5)
Chloride: 100 mEq/L (ref 96–112)
GFR: 68.93 mL/min (ref >60.00)
Glucose, Bld: 116 mg/dL — ABNORMAL HIGH (ref 70–99)

## 2011-02-23 LAB — TSH: TSH: 1.44 u[IU]/mL (ref 0.35–5.50)

## 2011-02-23 LAB — LIPID PANEL
Cholesterol: 171 mg/dL (ref 0–200)
LDL Cholesterol: 98 mg/dL (ref 0–99)
Triglycerides: 81 mg/dL (ref 0.0–149.0)
VLDL: 16.2 mg/dL (ref 0.0–40.0)

## 2011-02-23 LAB — PSA: PSA: 0.02 ng/mL — ABNORMAL LOW (ref 0.10–4.00)

## 2011-03-04 ENCOUNTER — Ambulatory Visit (INDEPENDENT_AMBULATORY_CARE_PROVIDER_SITE_OTHER): Payer: Medicare Other | Admitting: *Deleted

## 2011-03-04 DIAGNOSIS — I82409 Acute embolism and thrombosis of unspecified deep veins of unspecified lower extremity: Secondary | ICD-10-CM

## 2011-03-04 LAB — POCT INR: INR: 2.4

## 2011-04-01 ENCOUNTER — Ambulatory Visit (INDEPENDENT_AMBULATORY_CARE_PROVIDER_SITE_OTHER): Payer: Medicare Other | Admitting: *Deleted

## 2011-04-01 DIAGNOSIS — I82409 Acute embolism and thrombosis of unspecified deep veins of unspecified lower extremity: Secondary | ICD-10-CM

## 2011-04-15 ENCOUNTER — Ambulatory Visit (INDEPENDENT_AMBULATORY_CARE_PROVIDER_SITE_OTHER): Payer: Medicare Other | Admitting: Cardiology

## 2011-04-15 ENCOUNTER — Telehealth: Payer: Self-pay | Admitting: *Deleted

## 2011-04-15 ENCOUNTER — Encounter: Payer: Self-pay | Admitting: Cardiology

## 2011-04-15 VITALS — BP 130/71 | HR 63 | Ht 70.0 in | Wt 193.0 lb

## 2011-04-15 DIAGNOSIS — Z01818 Encounter for other preprocedural examination: Secondary | ICD-10-CM

## 2011-04-15 DIAGNOSIS — I5022 Chronic systolic (congestive) heart failure: Secondary | ICD-10-CM

## 2011-04-15 DIAGNOSIS — R0602 Shortness of breath: Secondary | ICD-10-CM

## 2011-04-15 LAB — BASIC METABOLIC PANEL
BUN: 23 mg/dL (ref 6–23)
Chloride: 106 mEq/L (ref 96–112)
Creatinine, Ser: 1.3 mg/dL (ref 0.4–1.5)
GFR: 53.72 mL/min — ABNORMAL LOW (ref >60.00)
Glucose, Bld: 144 mg/dL — ABNORMAL HIGH (ref 70–99)
Potassium: 5.8 mEq/L — ABNORMAL HIGH (ref 3.5–5.1)

## 2011-04-15 LAB — BRAIN NATRIURETIC PEPTIDE: Pro B Natriuretic peptide (BNP): 206 pg/mL — ABNORMAL HIGH (ref 0.0–100.0)

## 2011-04-15 NOTE — Assessment & Plan Note (Signed)
Patient is significantly limited by right knee pain. He has talked to Dr. Charlann Boxer about this is and apparently he could have knee replacement by spinal anesthesia rather than general.  He is going to seek a 2nd opinion from Dr. Lequita Halt.  He appears euvolemic today with minimal exertional dyspnea. He can climb a flight of steps without problem.  No chest pain.  His cardiac risk from surgery is increased, but it is not prohibitive as he is stable clinically.  Stress testing would be unlikely to benefit him so will not do this pre-operatively.

## 2011-04-15 NOTE — Assessment & Plan Note (Signed)
Long history of nonischemic cardiomyopathy.  Last EF 25%.  NYHA class II symptoms (more limited by knee pain) and appears euvolemic on current Lasix regimen.   - Continue losartan - He is willing to take a full 3.125 mg Coreg in the evening as he thinks it will help him to sleep.  He will continue taking 1/2 of a 3.125 mg pill in the am.  - Given IVCD, CRT may be an option => given age would not place ICD.  With current minimal exertional symptoms, would not push for CRT.  - Followup in 4 months.

## 2011-04-15 NOTE — Progress Notes (Signed)
PCP: Dr. Kriste Basque  77 yo with history of nonischemic cardiomyopathy presents for cardiology followup.  Last echo was in 7/12 with moderately dilated LV and EF 25%.  At last appointment, I started him on a low dose of carvedilol (3.125 mg bid).  He feels like it made him sleepy so he has been cutting the dose in half. He continues to have severe bilateral knee pain.  He plans to see Dr. Lequita Halt.  He is still playing golf 2-3 times a week.  He can walk on flat ground and up a flight of steps without dyspnea though his knees will hurt.  No chest pain.  No orthopnea or PND.  Weight is stable at 193 lbs.    ECG: NSR, IVCD, left axis deviation  Labs (2/12): K 4.4, creatinine 1.0, LDL 86, HDL 55 Labs (8/12): K 5.3, creatinine 1.1, LDL 98, HDL 57  PMH: 1. Nonischemic CMP: LHC (2002) with nonobstructive CAD.  Echo (12/10): EF 30-35%, diffuse hypokinesis worse in the inferior and posterior walls, mild AI, mild MR.  Echo (7/12): moderately dilated LV, EF 25%, diffuse hypokinesis, PA systolic pressure 49 mmHg.  2. H/o appendectomy 3. H/o DVT left leg, on chronic warfarin 4. IBS 5. H/o cholecystectomy 6. ERCP with sphincterotomy and stone removal 7. Prostate cancer: seed implantation.  8. Hernia repair 9. Left TKR 10. Diabetes mellitus: Diet-controlled 11. Hyperlipidemia  SH: Married, no children.  Quit smoking.  Rare ETOH.   FH: Father with CAD.  Current Outpatient Prescriptions  Medication Sig Dispense Refill  . Calcium Carbonate-Vitamin D (CALTRATE 600+D) 600-400 MG-UNIT per tablet Take 1 tablet by mouth 2 (two) times daily.        . Cholecalciferol (VITAMIN D-1000 MAX ST) 1000 UNITS tablet Take 1,000 Units by mouth daily.        Marland Kitchen dicyclomine (BENTYL) 20 MG tablet Take 20 mg by mouth 3 (three) times daily as needed.        Marland Kitchen esomeprazole (NEXIUM) 40 MG capsule Take 40 mg by mouth daily before breakfast. 30 min before first meal of day.       . furosemide (LASIX) 20 MG tablet Take 20 mg by mouth  daily.        Marland Kitchen HYDROcodone-acetaminophen (VICODIN) 5-500 MG per tablet Take 1 tablet by mouth every 6 (six) hours as needed.  100 tablet  5  . meloxicam (MOBIC) 7.5 MG tablet Take 7.5 mg by mouth daily as needed. For arthritis       . Multiple Vitamin (MULTIVITAMIN) tablet Take 1 tablet by mouth daily.        . pravastatin (PRAVACHOL) 40 MG tablet Take 40 mg by mouth at bedtime.        . pregabalin (LYRICA) 75 MG capsule Take 1 capsule (75 mg total) by mouth daily.  30 capsule  11  . ranitidine (ZANTAC) 150 MG tablet Take 150 mg by mouth daily.        Marland Kitchen warfarin (COUMADIN) 2 MG tablet Take by mouth as directed.        Marland Kitchen DISCONTD: carvedilol (COREG) 3.125 MG tablet Take 1/2 tablet by mouth two times daily per Dr. Shirlee Latch       . DISCONTD: losartan (COZAAR) 50 MG tablet Take 1 tablet (50 mg total) by mouth daily.  90 tablet  3  . carvedilol (COREG) 3.125 MG tablet Take 1/2 in the morning and 1 in the evening      . losartan (COZAAR) 25 MG tablet 1/2 of  50mg  tablet daily      . DISCONTD: carvedilol (COREG) 3.125 MG tablet Take 1 tablet (3.125 mg total) by mouth 2 (two) times daily with a meal.  180 tablet  3    BP 130/71  Pulse 63  Ht 5\' 10"  (1.778 m)  Wt 193 lb (87.544 kg)  BMI 27.69 kg/m2 General: elderly, NAD Neck: No JVD, no thyromegaly or thyroid nodule.  Lungs: Clear to auscultation bilaterally with normal respiratory effort. CV: Nondisplaced PMI.  Heart regular S1/S2, no S3/S4, 1/6 SEM.  Trace left ankle edema, no right ankle edema.  No carotid bruit.  Normal pedal pulses.  Abdomen: Soft, nontender, no hepatosplenomegaly, no distention.  Neurologic: Alert and oriented x 3.  Psych: Normal affect. Extremities: No clubbing or cyanosis.

## 2011-04-15 NOTE — Telephone Encounter (Signed)
Message copied by Jacqlyn Krauss on Wed Apr 15, 2011  4:22 PM ------      Message from: Laurey Morale      Created: Wed Apr 15, 2011  4:14 PM       Low K diet, decrease losartan to 25 mg daily, repeat K on Monday.

## 2011-04-15 NOTE — Telephone Encounter (Signed)
Notes Recorded by Jacqlyn Krauss, RN on 04/15/2011 at 4:21 PM Pt notified of Dr Alford Highland recommendations. Pt will return for BMET 04/20/11. Notes Recorded by Marca Ancona, MD on 04/15/2011 at 4:14 PM Low K diet, decrease losartan to 25 mg daily, repeat K on Monday.

## 2011-04-15 NOTE — Patient Instructions (Signed)
Take coreg(carvedilol) 3.125mg  one-half in the morning and one in the evening.  Your physician recommends that you have lab today--BMP/BNP 428.22  Your physician wants you to follow-up in:4 months with Dr Shirlee Latch.(January 2013). You will receive a reminder letter in the mail two months in advance. If you don't receive a letter, please call our office to schedule the follow-up appointment.

## 2011-04-20 ENCOUNTER — Other Ambulatory Visit (INDEPENDENT_AMBULATORY_CARE_PROVIDER_SITE_OTHER): Payer: Medicare Other | Admitting: *Deleted

## 2011-04-20 DIAGNOSIS — I5022 Chronic systolic (congestive) heart failure: Secondary | ICD-10-CM

## 2011-04-20 LAB — BASIC METABOLIC PANEL
BUN: 23 mg/dL (ref 6–23)
CO2: 29 mEq/L (ref 19–32)
Calcium: 9.2 mg/dL (ref 8.4–10.5)
Chloride: 104 mEq/L (ref 96–112)
Creatinine, Ser: 1 mg/dL (ref 0.4–1.5)

## 2011-04-29 ENCOUNTER — Ambulatory Visit (INDEPENDENT_AMBULATORY_CARE_PROVIDER_SITE_OTHER): Payer: Medicare Other | Admitting: *Deleted

## 2011-04-29 DIAGNOSIS — I82409 Acute embolism and thrombosis of unspecified deep veins of unspecified lower extremity: Secondary | ICD-10-CM

## 2011-04-29 LAB — POCT INR: INR: 2.9

## 2011-04-30 LAB — BASIC METABOLIC PANEL
BUN: 13
CO2: 31
Chloride: 100
Glucose, Bld: 108 — ABNORMAL HIGH
Potassium: 4.5

## 2011-04-30 LAB — CBC
HCT: 37.1 — ABNORMAL LOW
MCV: 92.3
Platelets: 200
WBC: 5

## 2011-04-30 LAB — PROTIME-INR: Prothrombin Time: 15.7 — ABNORMAL HIGH

## 2011-05-04 LAB — PROTIME-INR
INR: 1.3
INR: 2 — ABNORMAL HIGH

## 2011-05-04 LAB — BASIC METABOLIC PANEL
CO2: 30
CO2: 30
Calcium: 8.4
Chloride: 103
Creatinine, Ser: 0.97
GFR calc Af Amer: >60
GFR calc Af Amer: >60
GFR calc non Af Amer: >60
Glucose, Bld: 116 — ABNORMAL HIGH
Potassium: 4.3
Potassium: 4.4
Sodium: 138

## 2011-05-04 LAB — CBC
HCT: 30 — ABNORMAL LOW
HCT: 30 — ABNORMAL LOW
HCT: 38.3 — ABNORMAL LOW
Hemoglobin: 10.6 — ABNORMAL LOW
Hemoglobin: 10.6 — ABNORMAL LOW
Hemoglobin: 13.2
MCHC: 35.3
MCV: 92.7
MCV: 93.4
Platelets: 208
RBC: 3.24 — ABNORMAL LOW
RBC: 3.24 — ABNORMAL LOW
RBC: 4.1 — ABNORMAL LOW
RDW: 13.7
WBC: 5.8
WBC: 5

## 2011-05-04 LAB — COMPREHENSIVE METABOLIC PANEL
Albumin: 3.9
Alkaline Phosphatase: 41
BUN: 17
CO2: 31
Chloride: 103
Creatinine, Ser: 1.01
GFR calc non Af Amer: >60
Glucose, Bld: 116 — ABNORMAL HIGH
Potassium: 5.4 — ABNORMAL HIGH
Total Bilirubin: 0.6

## 2011-05-04 LAB — APTT: aPTT: 28

## 2011-05-04 LAB — URINALYSIS, ROUTINE W REFLEX MICROSCOPIC
Nitrite: NEGATIVE
Protein, ur: NEGATIVE
Urobilinogen, UA: 0.2

## 2011-05-04 LAB — TYPE AND SCREEN
ABO/RH(D): A POS
Antibody Screen: NEGATIVE

## 2011-05-04 LAB — URINE MICROSCOPIC-ADD ON

## 2011-05-27 ENCOUNTER — Encounter: Payer: Medicare Other | Admitting: *Deleted

## 2011-06-03 ENCOUNTER — Ambulatory Visit (INDEPENDENT_AMBULATORY_CARE_PROVIDER_SITE_OTHER): Payer: Medicare Other | Admitting: *Deleted

## 2011-06-03 DIAGNOSIS — I82409 Acute embolism and thrombosis of unspecified deep veins of unspecified lower extremity: Secondary | ICD-10-CM

## 2011-06-29 ENCOUNTER — Ambulatory Visit (INDEPENDENT_AMBULATORY_CARE_PROVIDER_SITE_OTHER): Payer: Medicare Other | Admitting: *Deleted

## 2011-06-29 DIAGNOSIS — I82409 Acute embolism and thrombosis of unspecified deep veins of unspecified lower extremity: Secondary | ICD-10-CM

## 2011-06-29 LAB — POCT INR: INR: 2.6

## 2011-07-06 ENCOUNTER — Other Ambulatory Visit: Payer: Self-pay | Admitting: Orthopedic Surgery

## 2011-07-23 ENCOUNTER — Telehealth: Payer: Self-pay | Admitting: Cardiology

## 2011-07-23 NOTE — Telephone Encounter (Signed)
Spoke with pt who states he has been having episodes for about 1 month of becoming "real hot" around1 hour after taking his Losartan.  He thinks the medication is making his blood pressure go up.   Explained to pt the medication is given for the treatment of HTN and would not be making his blood pressure "go up".  Pt has not checked his blood pressure and doesn't know what it is.  Suggested pt obtain a blood pressure monitor to be used at home.  He questions if him should restart the ramipril he was one.  Instructed pt that he should not make any changes in his medications without being seen.  He states understanding and has an appointment scheduled.

## 2011-07-23 NOTE — Telephone Encounter (Signed)
New Msg: Pt calling regarding heart medications that cause pt BP to increase. Pt would like to discuss this with nurse along with possible medication changes. Please return pt call to discuss further.

## 2011-08-05 ENCOUNTER — Encounter: Payer: Medicare Other | Admitting: *Deleted

## 2011-08-05 ENCOUNTER — Ambulatory Visit (INDEPENDENT_AMBULATORY_CARE_PROVIDER_SITE_OTHER): Payer: Medicare Other | Admitting: *Deleted

## 2011-08-05 DIAGNOSIS — I82409 Acute embolism and thrombosis of unspecified deep veins of unspecified lower extremity: Secondary | ICD-10-CM

## 2011-08-05 LAB — POCT INR: INR: 2.1

## 2011-08-13 ENCOUNTER — Encounter: Payer: Self-pay | Admitting: Cardiology

## 2011-08-13 ENCOUNTER — Ambulatory Visit (INDEPENDENT_AMBULATORY_CARE_PROVIDER_SITE_OTHER): Payer: Medicare Other | Admitting: Cardiology

## 2011-08-13 VITALS — BP 110/66 | HR 64 | Ht 70.0 in | Wt 197.0 lb

## 2011-08-13 DIAGNOSIS — E785 Hyperlipidemia, unspecified: Secondary | ICD-10-CM

## 2011-08-13 DIAGNOSIS — I5022 Chronic systolic (congestive) heart failure: Secondary | ICD-10-CM

## 2011-08-13 DIAGNOSIS — Z01818 Encounter for other preprocedural examination: Secondary | ICD-10-CM

## 2011-08-13 DIAGNOSIS — R0602 Shortness of breath: Secondary | ICD-10-CM

## 2011-08-13 DIAGNOSIS — I82409 Acute embolism and thrombosis of unspecified deep veins of unspecified lower extremity: Secondary | ICD-10-CM

## 2011-08-13 LAB — BASIC METABOLIC PANEL
BUN: 20 mg/dL (ref 6–23)
Calcium: 9.4 mg/dL (ref 8.4–10.5)
Chloride: 107 mEq/L (ref 96–112)
Creatinine, Ser: 1 mg/dL (ref 0.4–1.5)
GFR: 76.12 mL/min (ref >60.00)

## 2011-08-13 LAB — BRAIN NATRIURETIC PEPTIDE: Pro B Natriuretic peptide (BNP): 361 pg/mL — ABNORMAL HIGH (ref 0.0–100.0)

## 2011-08-13 MED ORDER — CARVEDILOL 3.125 MG PO TABS: 3.1250 mg | ORAL_TABLET | Freq: Two times a day (BID) | ORAL | Status: DC

## 2011-08-13 MED ORDER — FUROSEMIDE 40 MG PO TABS: 40.0000 mg | ORAL_TABLET | Freq: Every day | ORAL | Status: DC

## 2011-08-13 NOTE — Assessment & Plan Note (Signed)
History of DVT, on chronic coumadin.

## 2011-08-13 NOTE — Patient Instructions (Addendum)
Increase lasxi(furosemide) to 40mg  daily.  Increase coreg(carvedilol) to 3.125mg  twice a day. This will be one 3.125mg  tablet twice a day.  Your physician recommends that you have lab work today--BMET/BNP 428.22  Your physician recommends that you return for lab work in: 2 weeks--BMET 428.22  Your physician recommends that you schedule a follow-up appointment in: 4 months with Dr Shirlee Latch.

## 2011-08-13 NOTE — Assessment & Plan Note (Signed)
Patient is significantly limited by right knee pain. He has seen Dr. Lequita Halt and apparently he could have knee replacement by spinal anesthesia rather than general.  He can climb a flight of steps without problem other than knee pain.  He can walk 15 minutes on a treadmill. No chest pain.  His cardiac risk from surgery is increased, but it is not prohibitive as he is stable clinically.  Stress testing would be unlikely to benefit him so will not do this pre-operatively. I am increasing his Coreg today and will increase his Lasix also to make sure that volume is optimized prior to surgery.  Will need to be very careful with fluids peri-operatively to avoid pulmonary edema.  Please let me know when he is in the hospital and myself or one of my partners will follow along.

## 2011-08-13 NOTE — Assessment & Plan Note (Signed)
Long history of nonischemic cardiomyopathy.  Last EF 25%.  NYHA class II symptoms (more limited by knee pain).  He does appear mildly volume overloaded with increased lower extremity edema.  - Continue losartan (he is taking 50 mg daily).  - Increase Coreg to 3.125 mg bid.   - Increase Lasix to 40 mg daily.  He is planning on knee replacement surgery and would like his volume status to be optimized pre-operatively.  - Check BMET, BNP today and repeat BMET in 2 weeks with increase in Lasix.  - Given IVCD, CRT may be an option => given age would not place ICD.  With current minimal exertional symptoms, would not push for CRT.

## 2011-08-13 NOTE — Assessment & Plan Note (Signed)
On pravastatin, check lipids.

## 2011-08-13 NOTE — Progress Notes (Signed)
PCP: Dr. Kriste Basque  78 yo with history of nonischemic cardiomyopathy presents for cardiology followup.  Last echo was in 7/12 with moderately dilated LV and EF 25%.  He has had his left knee replaced but still has pain there.  He has severe OA in his right knee with considerable pain.  He has been talking with Dr Lequita Halt about TKA and this appears to be scheduled for March.  He can walk on flat ground and up a flight of steps without dyspnea though his knees will hurt.  He can walk for 15 minutes on a treadmill.  No chest pain.  No orthopnea or PND.   No lightheadedness except with prolonged standing.  He does report some increase in his lower extremity edema.    Labs (2/12): K 4.4, creatinine 1.0, LDL 86, HDL 55 Labs (8/12): K 5.3, creatinine 1.1, LDL 98, HDL 57 Labs (9/12): BNP 206 Labs (10/12): K 4.7, creatinine 1.0  PMH: 1. Nonischemic CMP: LHC (2002) with nonobstructive CAD.  Echo (12/10): EF 30-35%, diffuse hypokinesis worse in the inferior and posterior walls, mild AI, mild MR.  Echo (7/12): moderately dilated LV, EF 25%, diffuse hypokinesis, PA systolic pressure 49 mmHg.  2. H/o appendectomy 3. H/o DVT left leg, on chronic warfarin 4. IBS 5. H/o cholecystectomy 6. ERCP with sphincterotomy and stone removal 7. Prostate cancer: seed implantation.  8. Hernia repair 9. Left TKR 10. Diabetes mellitus: Diet-controlled 11. Hyperlipidemia  SH: Married, no children.  Quit smoking.  Rare ETOH.   FH: Father with CAD.  ROS: All systems reviewed and negative except as per HPI.   Current Outpatient Prescriptions  Medication Sig Dispense Refill  . Calcium Carbonate-Vitamin D (CALTRATE 600+D) 600-400 MG-UNIT per tablet Take 1 tablet by mouth 2 (two) times daily.        . Cholecalciferol (VITAMIN D-1000 MAX ST) 1000 UNITS tablet Take 1,000 Units by mouth daily.        Marland Kitchen dicyclomine (BENTYL) 20 MG tablet Take 20 mg by mouth 3 (three) times daily as needed.        Marland Kitchen esomeprazole (NEXIUM) 40 MG  capsule Take 40 mg by mouth daily before breakfast. 30 min before first meal of day.       Marland Kitchen HYDROcodone-acetaminophen (VICODIN) 5-500 MG per tablet Take 1 tablet by mouth every 6 (six) hours as needed.  100 tablet  5  . meloxicam (MOBIC) 7.5 MG tablet Take 7.5 mg by mouth daily as needed. For arthritis       . Multiple Vitamin (MULTIVITAMIN) tablet Take 1 tablet by mouth daily.        . pravastatin (PRAVACHOL) 40 MG tablet Take 40 mg by mouth at bedtime.        . pregabalin (LYRICA) 75 MG capsule Take 1 capsule (75 mg total) by mouth daily.  30 capsule  11  . ranitidine (ZANTAC) 150 MG tablet Take 150 mg by mouth daily.        Marland Kitchen warfarin (COUMADIN) 2 MG tablet Take by mouth as directed.        Marland Kitchen DISCONTD: carvedilol (COREG) 3.125 MG tablet Take 1/2 in the morning and 1 in the evening      . DISCONTD: losartan (COZAAR) 25 MG tablet 1/2 of 50mg  tablet daily      . carvedilol (COREG) 3.125 MG tablet Take 1 tablet (3.125 mg total) by mouth 2 (two) times daily.  60 tablet  6  . furosemide (LASIX) 40 MG tablet Take 1 tablet (  40 mg total) by mouth daily.  30 tablet  6  . losartan (COZAAR) 50 MG tablet Take 1 tablet (50 mg total) by mouth daily.      Marland Kitchen DISCONTD: carvedilol (COREG) 3.125 MG tablet Take 1 tablet (3.125 mg total) by mouth 2 (two) times daily with a meal.  180 tablet  3    BP 110/66  Pulse 64  Ht 5\' 10"  (1.778 m)  Wt 89.359 kg (197 lb)  BMI 28.27 kg/m2 General: elderly, NAD Neck: JVP difficult, no thyromegaly or thyroid nodule.  Lungs: Clear to auscultation bilaterally with normal respiratory effort. CV: Nondisplaced PMI.  Heart regular S1/S2, no S3/S4, 1/6 SEM.  1+ edema to knee on left, 1+ edema 1/2 up on right.  No carotid bruit.  Normal pedal pulses.  Abdomen: Soft, nontender, no hepatosplenomegaly, no distention.  Neurologic: Alert and oriented x 3.  Psych: Normal affect. Extremities: No clubbing or cyanosis.

## 2011-08-25 ENCOUNTER — Other Ambulatory Visit: Payer: Self-pay | Admitting: *Deleted

## 2011-08-25 MED ORDER — PREGABALIN 75 MG PO CAPS: 75.0000 mg | ORAL_CAPSULE | Freq: Every day | ORAL | Status: DC

## 2011-08-26 ENCOUNTER — Encounter: Payer: Self-pay | Admitting: Pulmonary Disease

## 2011-08-26 ENCOUNTER — Ambulatory Visit (INDEPENDENT_AMBULATORY_CARE_PROVIDER_SITE_OTHER)
Admission: RE | Admit: 2011-08-26 | Discharge: 2011-08-26 | Disposition: A | Discharge status: Home / Self Care | Payer: Medicare Other | Source: Ambulatory Visit | Attending: Pulmonary Disease | Admitting: Pulmonary Disease

## 2011-08-26 ENCOUNTER — Ambulatory Visit (INDEPENDENT_AMBULATORY_CARE_PROVIDER_SITE_OTHER): Payer: Medicare Other | Admitting: Pulmonary Disease

## 2011-08-26 DIAGNOSIS — I447 Left bundle-branch block, unspecified: Secondary | ICD-10-CM

## 2011-08-26 DIAGNOSIS — I82409 Acute embolism and thrombosis of unspecified deep veins of unspecified lower extremity: Secondary | ICD-10-CM

## 2011-08-26 DIAGNOSIS — I10 Essential (primary) hypertension: Secondary | ICD-10-CM

## 2011-08-26 DIAGNOSIS — E785 Hyperlipidemia, unspecified: Secondary | ICD-10-CM

## 2011-08-26 DIAGNOSIS — I428 Other cardiomyopathies: Secondary | ICD-10-CM

## 2011-08-26 DIAGNOSIS — Z8546 Personal history of malignant neoplasm of prostate: Secondary | ICD-10-CM

## 2011-08-26 DIAGNOSIS — E119 Type 2 diabetes mellitus without complications: Secondary | ICD-10-CM

## 2011-08-26 DIAGNOSIS — M1990 Unspecified osteoarthritis, unspecified site: Secondary | ICD-10-CM

## 2011-08-26 DIAGNOSIS — K219 Gastro-esophageal reflux disease without esophagitis: Secondary | ICD-10-CM

## 2011-08-26 MED ORDER — DICYCLOMINE HCL 20 MG PO TABS: 20.0000 mg | ORAL_TABLET | Freq: Three times a day (TID) | ORAL | Status: DC | PRN

## 2011-08-26 MED ORDER — ESOMEPRAZOLE MAGNESIUM 40 MG PO CPDR: 40.0000 mg | DELAYED_RELEASE_CAPSULE | Freq: Every day | ORAL | Status: DC

## 2011-08-26 MED ORDER — MELOXICAM 7.5 MG PO TABS: 7.5000 mg | ORAL_TABLET | Freq: Every day | ORAL | Status: DC | PRN

## 2011-08-26 MED ORDER — WARFARIN SODIUM 2 MG PO TABS: ORAL_TABLET | ORAL | Status: DC

## 2011-08-26 MED ORDER — RANITIDINE HCL 150 MG PO TABS: 150.0000 mg | ORAL_TABLET | Freq: Every day | ORAL | Status: DC

## 2011-08-26 MED ORDER — PRAVASTATIN SODIUM 40 MG PO TABS: 40.0000 mg | ORAL_TABLET | Freq: Every day | ORAL | Status: DC

## 2011-08-26 NOTE — Progress Notes (Signed)
Subjective:    Patient ID: [name] [name], male    DOB: Jun 13, 1923, 78 y.o.   MRN: [id number]  HPI 78 y/o WM w/ mult med problems here for a follow up OV...    ~  February 19, 2010:  Overall stable w/ CC= his arthritis/ DJD> pain in knees & neck... he saw DrBBrodie 6/11> stable but c/o dizzy on Lisinopril, therefore changed to Ramipril but his head felt funny, so now on  LOSARTAN, not on BBlocker due to brady... BS controlled on diet + exercise... Chol OK on Prav40... PSA remains "zero" s/p seed implants... he gets all his meds filled at [facility].  ~  August 20, 2010:  He's had a good 72mo overall- CC= arthritis & he is holding off on right TKR from DrOlin per DrBBrodie's rec against surg;  he's been taking Lyrica ~Qod for "feet burning"/ neuropathy symptoms but doesn't like it cause he thinks it's making him gain weight> we discussed change to Gabapentin 100-200-300mg  Qhs to see if this is more agreeable to him.    He notes BS at home incr to ~145 ave & we will recheck FBS & A1c to see if he needs to start meds;  he wants to be sure he gets his permission slip signed for his free diabetic shoes.    He saw DrBB 12/11 for Cards f/u> doing well w/ his non-ischemic cardiomyopathy, EF= 30-35%, & no changes made; he will be following up w/ DrMcLean; he had some leg swelling & [facility] which [facility]- pt on Coumadin via the CC for hx DVT.  ~  February 18, 2011:  72mo ROV & he is c/o knee pain & nocturnal leg cramps for which he takes mustard;  He didn't like the Gabapentins- states it gave him hot flashes, and he prefers the [name]...    He saw DrMcLean for Cards f/u 6/12> non-ischemic cardiomyopathy & he is quite active w/ golfing & treadmill, no CP but c/o knee pain etc;  He started BBlocker COREG 3.125mg  Bid & watching for any bradycardia (pt has already decreased this to 1/2 tab Bid "it made mu head feel funny");  f/u 2DEcho showed dilated LV w/ EF=25% & diffuse HK, dil LA, triv  AI noted;  EKG w/ NSR, IVCD, NSSTTWA...  ~  August 26, 2011:  72mo ROV & [name] tells me he has right knee replacement surg sched for 10/06/11 w/ DrAlusio; he has been caring for wife s/p fx hip & he's having a lot of trouble getting around; he was prev rec NOT to have right TKR by DrBB in 2010>    He saw DrMcLean (Cards) 1/13 for f/u of his ischemic cardiomyopathy (Echo 7/12 w/ dilLV & EF25%, diffuse HK)> he denies CP, palpit, ch in DOE or edema; on Coreg, Lasix, Cozaar; labs showed normal renal function & BNP=361 (Lasix incr to 40 & Cozaar decr to 25); [facility] [place] [facility] will consult while he is in the [place]...    CXR shows mild cardiomegaly, clear lungs, NAD...    Chol stable on Prav40 (see below);  DM control still adeq on diet alone;  Hg=11.9 mild anemia...   1.  Non-obstructive CAD, Cardiomyopathy, LBBB - on LOSARTAN 25mg /d,  LASIX 40mg /d, and now COREG 3.125mg Bid... followed by Dr Shirlee Latch for Cards... his prev Toprol was stopped secondary to bradycardia, and he has prev refused to take Vasotec, c/o dizzy from Lisinopril, & head felt funny on Altace. ~  baseline EKG= NSR, LBBB, IVCD... ~  2DEcho 11/08 showed global HK, dil LV, EF=35-40%, mild AR/ MR...  ~  labs 6/09 showed BUN=20, Creat= 1.1, K= 4.8, BNP= 57... ~  2DEcho 5/10 showed LV mod dil w/ diffuse HK & EF= 30-35%, mild MR & AI, dil atria & incr PAsys=48. ~  Pre-op eval 12/10 by BB w/ repeat echo (no change)- pt states he advised against further ortho surg. ~  6/12:  Cards eval from DrMcLean> 2DEcho w/ EF=25% & diffuse HK; COREG added to his regimen... ~  2/13:  CXR showed mild cardiomegaly, clear lungs, NAD...  2.  Hx DVT - he continues on Coumadin and followed in the coumadin clinic...  3.  DM - he's on diet alone... he reports recent incr in his BS ave at home to ~145 range. ~  labs 2/08 showed BS= 128, HgA1c = 6.3 ~  labs 6/09 showed BS= 118, HgA1c= 6.3 ~  he saw DrGroat 2/10 for eye check- no retinopathy... ~  labs 8/10 showed BS= 109,  A1c= 6.3 ~  labs 8/11 showed BS= 112, A1c=6.7 ~  labs 2/12 showed BS= 116, A1c= 6.7.Marland KitchenMarland Kitchen OK to continue diet Rx. ~  Labs 2/13 on diet alone showed BS= 127, A1c= 6.7.Marland KitchenMarland Kitchen Discussed low carb diet.  4.  Chol - on PRAVASTATIN 40 and tolerating this well... prev had trouble w/ Zocor- increased LFTs. ~  FLP 5/08 showed TChol 212, TG 77, HDL 75, LDL 108 ~  FLP 6/09 showed TChol 180, TG 80, HDL 54, LDL 110 ~  FLP 2/10 showed TChol 226, TG 43, HDL 65, LDL 148... he declined to incr the Prav to 80. ~  FLP 8/10 showed TChol 187, TG 87, HDL 52, LDL 87 ~  FLP 8/11 showed TChol 176, TG 87, HDL 56, LDL 102 ~  FLP 2/12 showed TChol 157, TG 82, HDL 55, LDL 86... Continue same. ~  FLP 2/13 on Prav40 showed TChol 146, TG 53, HDL 58, LDL 78  5.  DJD - he had a Left TKR by DrOlin 8/08... he went to the [facility] post op to recoup, then back home (& required EUA w/ manipulation to lyse adhesions)> after that his wife fell and broke her hip...  now having incr difficulty w/ right knee and had shots from ortho... he takes VICODIN Tid Prn,  MOBIC 7.5mg  Prn, along w/ Calcium, MVI, & Vit D... he's been on LYRICA 75mg  Prn (ave Qod) for neuropathy in feet> he was intol to Gabapentin w/ "hot flashes" ~  DrOlin planned right TKR 1/11- pre-op eval from Cards & pt reports they advised against surg by DrBB therefore surg not done. ~  2/12:  c/o weight gain on Lyrica (~Qod) for neuropathy; rec ch to Gabapentin 100>200>300mg  Qhs as trial, but subseq intol as above.  6. GERD &  IBS - on PREVACID 30mg /d, & Zantac 150mg HS, Bentyl 20mg Tid Prn which really helps his IBS symptoms. ~  2/10:  c/o incr reflux & peptic symptoms... advised start PREVACID 30mg /d in AM & take Zantac HS...   7.  PROSTATE CANCER - followed by DrPeterson/ Dahlstedt & treated w/ radium seeds in 2000... ~  labs 2/10 showed PSA= 0.02 ~  labs 8/11 showed PSA= 0.02  8.  ANEMIA >> he has a borderline anemia & takes MVI... ~  Labs 8/10 showed Hg= 13.1, MCV= 95 ~   Labs 6/11 showed Hg= 12.4, MCV= 94 ~  Labs 8/12 showed Hg= 12.4, MCV= 97 ~  Labs 2/13 showed Hg= 11.9, MCV= 97  9.  SHINGLES? - he states that he "caught" shingles from a guy he was sitting next to at a restaurant & broke out on his leg 2-3d later... he went to see Derm, DrLeschin & treated...   Past Surgical History  Procedure Date  . Appendectomy   . Cholecystectomy     1985 by Dr. Orpah Greek  . Knee arthroscopy     L knee. 1991 by Dr. Criss Alvine  . Ercp with sphincterotomy and stone removal     5/93 by Dr. Russella Dar  . Bilat inguinal hernia repair     1998 by Dr. Samuella Cota  . Radium seed implantation for prostate cancer     s/p. 1/00 by Dr. Vonita Moss and Dan Humphreys  . R cataract surgery     2004 by Dr. Elmer Picker  . Repair of right recurrent right inguinal hernia     12/07 by Dr. Lurene Shadow  . Basal cell cancer     L ear. s/p Moh's surgery 3/09 Dr. Alean Rinne  . Total knee arthroplasty     Left. 8/08 by Dr. Charlann Boxer    Outpatient Encounter Prescriptions as of 08/26/2011  Medication Sig Dispense Refill  . Calcium Carbonate-Vitamin D (CALTRATE 600+D) 600-400 MG-UNIT per tablet Take 1 tablet by mouth 2 (two) times daily.        . carvedilol (COREG) 3.125 MG tablet Take 1 tablet (3.125 mg total) by mouth 2 (two) times daily.  60 tablet  6  . Cholecalciferol (VITAMIN D-1000 MAX ST) 1000 UNITS tablet Take 1,000 Units by mouth daily.        Marland Kitchen dicyclomine (BENTYL) 20 MG tablet Take 20 mg by mouth 3 (three) times daily as needed.        Marland Kitchen esomeprazole (NEXIUM) 40 MG capsule Take 40 mg by mouth daily before breakfast. 30 min before first meal of day.       . furosemide (LASIX) 40 MG tablet Take 40 mg by mouth 2 (two) times daily.      Marland Kitchen HYDROcodone-acetaminophen (VICODIN) 5-500 MG per tablet Take 1 tablet by mouth every 6 (six) hours as needed.  100 tablet  5  . losartan (COZAAR) 50 MG tablet Take 1/2 tablet by mouth once daily      . meloxicam (MOBIC) 7.5 MG tablet Take 7.5 mg by mouth daily as needed. For arthritis        . Multiple Vitamin (MULTIVITAMIN) tablet Take 1 tablet by mouth daily.        . pravastatin (PRAVACHOL) 40 MG tablet Take 40 mg by mouth at bedtime.        . pregabalin (LYRICA) 75 MG capsule Take 1 capsule (75 mg total) by mouth daily.  30 capsule  5  . ranitidine (ZANTAC) 150 MG tablet Take 150 mg by mouth daily.        Marland Kitchen warfarin (COUMADIN) 2 MG tablet Take by mouth as directed.        Marland Kitchen DISCONTD: furosemide (LASIX) 40 MG tablet Take 1 tablet (40 mg total) by mouth daily.  30 tablet  6    No Known Allergies   Current Medications, Allergies, Past Medical History, Past Surgical History, Family History, and Social History were reviewed in [employer] record.    Review of Systems    See HPI - all other systems neg except as noted...  The patient complains of dyspnea on exertion.  The patient denies anorexia, fever, weight loss, weight gain, vision  loss, decreased hearing, hoarseness, chest pain, syncope, peripheral edema, prolonged cough, headaches, hemoptysis, abdominal pain, melena, hematochezia, severe indigestion/heartburn, hematuria, incontinence, muscle weakness, suspicious skin lesions, transient blindness, difficulty walking, depression, unusual weight change, abnormal bleeding, enlarged lymph nodes, and angioedema.     Objective:   Physical Exam    WD, WN, 86 WM in NAD... GENERAL:  Alert & oriented; pleasant & cooperative... HEENT:  [place]/AT, EOM-wnl, PERRLA, EACs-clear, TMs-wnl, NOSE-clear, THROAT-clear & wnl. NECK:  Supple w/ fair ROM; no JVD; normal carotid impulses w/o bruits; no thyromegaly or nodules palpated; no lymphadenopathy. CHEST:  Clear to P & A; without wheezes/ rales/ or rhonchi. HEART:  Regular Rhythm;  Gr 1/6 SEM w/o rubs or gallops. ABDOMEN:  Soft & nontender; normal bowel sounds; no organomegaly or masses detected. EXT:  s/p left TKR, mod-severe arthritic changes; mild venous insuffic changes, no signif edema. NEURO:  CN's intact;  motor testing normal; sensory testing equivocal;  gait abnormal, balance fair. DERM:  No lesions noted; no rash etc...  RADIOLOGY DATA:  Reviewed in the EPIC EMR & discussed w/ the patient...  LABORATORY DATA:  Reviewed in the EPIC EMR & discussed w/ the patient...   Assessment & Plan:   Non-obstructive CAD, Cardiomyopathy w/ EF=25%, LBBB>  Followed by [facility] & his note reviewed; Meds adjusted w/ Coreg 3.125Bid, Cozaar 25mg /d, Lasix40 Qam...   Hx DVT>  He remains on Coumadin via the CC; they will help coordinate perioperative management...  CHOL>  On Prav40 & tolerating this reasonably well...  DM>  Controlled on diet alone & reminded to avoid sugars & sweets...  GI> GERD, IBS>  Stable on meds, continue same...  Prostate Cancer>  follwed by DrDahlstadt & stable as above...  DJD>  His operative risk is elev given his age, comorbid conditions, cardiomyopathy; he is willing to accept these risks given the severity of his arthritis & approaching inability to walk;  Continue Mobic, Vicodin, Lyrica, etc...   Patient's Medications:  08/26/11  New Prescriptions   No medications on file  Previous Medications >> These meds were not refilled today...   CALCIUM CARBONATE-VITAMIN D (CALTRATE 600+D) 600-400 MG-UNIT PER TABLET    Take 1 tablet by mouth 2 (two) times daily.     CARVEDILOL (COREG) 3.125 MG TABLET    Take 1 tablet (3.125 mg total) by mouth 2 (two) times daily.   CHOLECALCIFEROL (VITAMIN D-1000 MAX ST) 1000 UNITS TABLET    Take 1,000 Units by mouth daily.     HYDROCODONE-ACETAMINOPHEN (VICODIN) 5-500 MG PER TABLET    Take 1 tablet by mouth every 6 (six) hours as needed.   LOSARTAN (COZAAR) 50 MG TABLET    Take 1/2 tablet by mouth once daily   MULTIPLE VITAMIN (MULTIVITAMIN) TABLET    Take 1 tablet by mouth daily.     PREGABALIN (LYRICA) 75 MG CAPSULE    Take 1 capsule (75 mg total) by mouth daily.  Modified Medications >> These meds were updated &/or refilled today...   Modified  Medication Previous Medication   DICYCLOMINE (BENTYL) 20 MG TABLET dicyclomine (BENTYL) 20 MG tablet      Take 1 tablet (20 mg total) by mouth 3 (three) times daily as needed.    Take 20 mg by mouth 3 (three) times daily as needed.     ESOMEPRAZOLE (NEXIUM) 40 MG CAPSULE esomeprazole (NEXIUM) 40 MG capsule      Take 1 capsule (40 mg total) by mouth daily before breakfast. 30 min before first meal of  day.    Take 40 mg by mouth daily before breakfast. 30 min before first meal of day.    FUROSEMIDE (LASIX) 40 MG TABLET furosemide (LASIX) 40 MG tablet      Take 40 mg by mouth 2 (two) times daily.    Take 1 tablet (40 mg total) by mouth daily.   MELOXICAM (MOBIC) 7.5 MG TABLET meloxicam (MOBIC) 7.5 MG tablet      Take 1 tablet (7.5 mg total) by mouth daily as needed. For arthritis    Take 7.5 mg by mouth daily as needed. For arthritis    PRAVASTATIN (PRAVACHOL) 40 MG TABLET pravastatin (PRAVACHOL) 40 MG tablet      Take 1 tablet (40 mg total) by mouth at bedtime.    Take 40 mg by mouth at bedtime.     RANITIDINE (ZANTAC) 150 MG TABLET ranitidine (ZANTAC) 150 MG tablet      Take 1 tablet (150 mg total) by mouth daily.    Take 150 mg by mouth daily.     WARFARIN (COUMADIN) 2 MG TABLET warfarin (COUMADIN) 2 MG tablet      Take as directed    Take by mouth as directed.    Discontinued Medications   No medications on file

## 2011-08-26 NOTE — Patient Instructions (Signed)
Today we updated your med list in our EPIC system...    Continue your current medications the same...    Keep the Lasix (Furosemide) at 40mg  each AM...  We refilled the meds you requested...  Today we did your follow up CXR... Please return to our lab one morning soon for your fasting blood work...    Then please call the PHONE TREE in a few days for your results...    Dial [id number] & when prompted enter your patient number followed by the # symbol...    Your patient number is:  [id number]#  Good luck w/ the up-coming knee surgery...  Call for any questions or if we can be of service in any way...  Let's continue our 6 month follow up visits.Marland KitchenMarland Kitchen

## 2011-08-27 ENCOUNTER — Other Ambulatory Visit: Payer: Medicare Other | Admitting: *Deleted

## 2011-08-31 ENCOUNTER — Other Ambulatory Visit (INDEPENDENT_AMBULATORY_CARE_PROVIDER_SITE_OTHER): Payer: Medicare Other

## 2011-08-31 DIAGNOSIS — E785 Hyperlipidemia, unspecified: Secondary | ICD-10-CM

## 2011-08-31 DIAGNOSIS — I82409 Acute embolism and thrombosis of unspecified deep veins of unspecified lower extremity: Secondary | ICD-10-CM

## 2011-08-31 DIAGNOSIS — I5022 Chronic systolic (congestive) heart failure: Secondary | ICD-10-CM

## 2011-08-31 DIAGNOSIS — E119 Type 2 diabetes mellitus without complications: Secondary | ICD-10-CM

## 2011-08-31 DIAGNOSIS — I10 Essential (primary) hypertension: Secondary | ICD-10-CM

## 2011-08-31 LAB — BASIC METABOLIC PANEL
BUN: 24 mg/dL — ABNORMAL HIGH (ref 6–23)
CO2: 30 mEq/L (ref 19–32)
Calcium: 9.2 mg/dL (ref 8.4–10.5)
Chloride: 105 mEq/L (ref 96–112)
GFR: 65.34 mL/min (ref >60.00)
Glucose, Bld: 128 mg/dL — ABNORMAL HIGH (ref 70–99)
Potassium: 4.6 mEq/L (ref 3.5–5.1)
Sodium: 139 mEq/L (ref 135–145)

## 2011-08-31 LAB — LIPID PANEL
Total CHOL/HDL Ratio: 3
Triglycerides: 53 mg/dL (ref 0.0–149.0)

## 2011-08-31 LAB — CBC WITH DIFFERENTIAL/PLATELET
Basophils Relative: 1.1 % (ref 0.0–3.0)
Eosinophils Absolute: 0.1 10*3/uL (ref 0.0–0.7)
Hemoglobin: 11.9 g/dL — ABNORMAL LOW (ref 13.0–17.0)
MCHC: 35 g/dL (ref 30.0–36.0)
MCV: 96.7 fl (ref 78.0–100.0)
Monocytes Absolute: 0.3 10*3/uL (ref 0.1–1.0)
Neutro Abs: 3.1 10*3/uL (ref 1.4–7.7)
RBC: 3.52 Mil/uL — ABNORMAL LOW (ref 4.22–5.81)

## 2011-09-01 ENCOUNTER — Encounter: Payer: Self-pay | Admitting: Pulmonary Disease

## 2011-09-02 ENCOUNTER — Ambulatory Visit (INDEPENDENT_AMBULATORY_CARE_PROVIDER_SITE_OTHER): Payer: Medicare Other | Admitting: *Deleted

## 2011-09-02 ENCOUNTER — Encounter: Payer: Medicare Other | Admitting: *Deleted

## 2011-09-02 DIAGNOSIS — I82409 Acute embolism and thrombosis of unspecified deep veins of unspecified lower extremity: Secondary | ICD-10-CM

## 2011-09-28 ENCOUNTER — Encounter (HOSPITAL_COMMUNITY): Payer: Self-pay

## 2011-09-28 ENCOUNTER — Encounter (HOSPITAL_COMMUNITY): Payer: Self-pay | Admitting: Pharmacy Technician

## 2011-09-28 ENCOUNTER — Encounter (HOSPITAL_COMMUNITY)
Admission: RE | Admit: 2011-09-28 | Discharge: 2011-09-28 | Disposition: A | Discharge status: Home / Self Care | Payer: Medicare Other | Source: Ambulatory Visit | Attending: Orthopedic Surgery | Admitting: Orthopedic Surgery

## 2011-09-28 LAB — SURGICAL PCR SCREEN
MRSA, PCR: NEGATIVE
Staphylococcus aureus: POSITIVE — AB

## 2011-09-28 LAB — COMPREHENSIVE METABOLIC PANEL
ALT: 15 U/L (ref 0–53)
AST: 23 U/L (ref 0–37)
CO2: 29 mEq/L (ref 19–32)
Calcium: 10.1 mg/dL (ref 8.4–10.5)
Chloride: 103 mEq/L (ref 96–112)
GFR calc non Af Amer: 58 mL/min — ABNORMAL LOW (ref >90)
Sodium: 140 mEq/L (ref 135–145)
Total Bilirubin: 0.4 mg/dL (ref 0.3–1.2)

## 2011-09-28 LAB — URINALYSIS, ROUTINE W REFLEX MICROSCOPIC
Glucose, UA: NEGATIVE mg/dL
Ketones, ur: NEGATIVE mg/dL
Leukocytes, UA: NEGATIVE
pH: 6 (ref 5.0–8.0)

## 2011-09-28 LAB — URINE MICROSCOPIC-ADD ON

## 2011-09-28 LAB — APTT: aPTT: 44 seconds — ABNORMAL HIGH (ref 24–37)

## 2011-09-28 LAB — CBC
Platelets: 200 10*3/uL (ref 150–400)
RBC: 3.74 MIL/uL — ABNORMAL LOW (ref 4.22–5.81)
WBC: 5.1 10*3/uL (ref 4.0–10.5)

## 2011-09-28 NOTE — Patient Instructions (Signed)
20 WILLEM KLINGENSMITH  09/28/2011   Your procedure is scheduled on: 10-05-11  Report to [facility] at    0845    AM.  Call this number if you have problems the morning of surgery: [phone]   Remember:   Do not eat food:After Midnight.  May have clear liquids:up to 6 Hours before arrival. Nothing after : 0500AM  Clear liquids include soda, tea, black coffee, apple or grape juice, broth.  Take these medicines the morning of surgery with A SIP OF WATER: Carvedilol, Vicodin, Ranitidine, Nexium   Do not wear jewelry, make-up or nail polish.  Do not wear lotions, powders, or perfumes. You may wear deodorant.  Do not shave 48 hours prior to surgery.(shave neck and face okay)  Do not bring valuables to the hospital.  Contacts, dentures or bridgework may not be worn into surgery.  Leave suitcase in the car. After surgery it may be brought to your room.  For patients admitted to the hospital, checkout time is 11:00 AM the day of discharge.   Patients discharged the day of surgery will not be allowed to drive home.  Name and phone number of your driver: will arrange  Special Instructions: CHG Shower Use Special Wash: 1/2 bottle night before surgery and 1/2 bottle morning of surgery.(Avoid face )  Please read over the following fact sheets that you were given: MRSA Information, Blood fact sheet, Incentive Spirometry instruction.

## 2011-09-29 NOTE — Pre-Procedure Instructions (Signed)
09-28-11 EKG(01-12-11), CXR ( 08-26-11)-reports with chart. Surgical clearance with chart. 09-29-11 Pt. Remains on Coumadin, to stop x 5 days prior- PT will be repeated AM of.W. Kennon Portela

## 2011-09-29 NOTE — H&P (Signed)
[name] [name] DOB: 1923/01/05  Chief Complaint: right knee pain  History of Present Illness The patient is a 78 year old male who comes in today for a preoperative History and Physical. The patient is scheduled for a right total knee arthroplasty to be performed by Dr. Gus Rankin. Aluisio, MD at [facility] on October 05, 2011 . They are now 1 year out from when symptoms began. Symptoms reported today include: pain, swelling and difficulty arising from chair and reports their pain level to be moderate. He has a fair amount of morning stiffnes. He has poppng and grinding and also buckling symptoms with the right knee. Marland Kitchen Hydrocodone has been used for pain control which helps a little bit. No long term relief with cortisone injections. He had his left knee replaced about three or four years ago and it did not have a great outcome but the right knee has continued to get worse with time. Much worse over the past year. Pain during the day and even night pain. He did receive a cortisone shot in the right knee in October 2012 and it only lasted a short while. At this point the most predictable means for decreased pain and increased function is a right total knee. PCP: Terressa Koyanagi Cardio: Dr. Jearld Pies    Past MedicalHistory Pain in joint, lower leg (719.46).  Pain in joint, pelvis/thigh (719.45).  Lumbago (724.2).  Osteoarthrosis NOS, lower leg (715.96).  Fx closed rib NOS (807.00). 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  Pain in joint, forearm (719.43). Osteoarthrosis, local NOS, lower leg (715.36).  Orthopedic aftercare NEC (V54.89).  Ankylosis, joint, lower leg (718.56).  Prostate Cancer Diabetes Mellitus, Type I. diet controlled Gastroesophageal Reflux Disease Dentures Heart disease DVT. left leg, following left TKA  Allergies No Known Drug Allergies.    Family History Congestive Heart Failure. father Heart  Disease. father Father. deceased age 3 heart disease, MI, diabetes Mother. deceased age 34 diabetes Brother 1. deceased age 68s throat cancer   Social History Exercise. Exercises weekly; does running / walking and individual sport Illicit drug use. no Drug/Alcohol Rehab (Previously). no Current work status. retired Financial planner (Currently). no Living situation. Lives with spouse. live with spouse Tobacco / smoke exposure. no Tobacco use. former smoker; smoke(d) 1 pack(s) per day Pain Contract. no Marital status. married Number of flights of stairs before winded. greater than 5 No alcohol use Alcohol use. current drinker; drinks beer; only occasionally per week Children. 0 Caregiver. after surgery: [employer] living will   Medication History Coumadin (7.5MG  Tablet, Oral) Active. Lisinopril (40MG  Tablet, Oral) Active. Cardizem CD (360MG  Capsule ER 24HR, Oral) Active.   Past Surgical History Total Knee Replacement. left 2010 Gallbladder Surgery. open Inguinal Hernia Repair Tonsillectomy. childhood     Review of Systems General:Not Present- Chills, Fever, Night Sweats, Fatigue, Weight Gain, Weight Loss and Memory Loss. Skin:Not Present- Hives, Itching, Rash, Eczema and Lesions. HEENT:Present- Dentures. Not Present- Tinnitus, Headache, Double Vision, Visual Loss and Hearing Loss. Respiratory:Not Present- Shortness of breath with exertion, Shortness of breath at rest, Allergies, Coughing up blood and Chronic Cough. Cardiovascular:Not Present- Chest Pain, Racing/skipping heartbeats, Difficulty Breathing Lying Down, Murmur, Swelling and Palpitations. Gastrointestinal:Not Present- Bloody Stool, Heartburn, Abdominal Pain, Vomiting, Nausea, Constipation, Diarrhea, Difficulty Swallowing, Jaundice and Loss of appetitie. Male Genitourinary:Not Present- Urinary frequency, Blood in Urine, Weak urinary stream, Discharge,  Flank Pain, Incontinence, Painful Urination, Urgency, Urinary Retention and Urinating at Night. Musculoskeletal:Present- Joint Pain. Not Present-  Muscle Weakness, Muscle Pain, Joint Swelling, Back Pain, Morning Stiffness and Spasms. Neurological:Not Present- Tremor, Dizziness, Blackout spells, Paralysis, Difficulty with balance and Weakness. Psychiatric:Not Present- Insomnia.   Vitals 09/28/2011 4:08 PM Weight: 190 lb Height: 70 in Body Surface Area: 2.06 m Body Mass Index: 27.26 kg/m Pulse: 83 (Regular) Resp.: 16 (Unlabored) BP: 129/77 (Sitting, Left Arm, Standard)    Physical Exam General Mental Status - Alert, cooperative and good historian. General Appearance- pleasant. Not in acute distress. Orientation- Oriented X3. Build & Nutrition- Well nourished and Well developed. Head and Neck Head- normocephalic, atraumatic . Neck Global Assessment- supple. no bruit auscultated on the right and no bruit auscultated on the left. Eye Pupil- Bilateral- PERRLA. Motion- Bilateral- EOMI. Chest and Lung Exam Auscultation: Breath sounds:- clear at anterior chest wall and - clear at posterior chest wall. Adventitious sounds:- No Adventitious sounds. Cardiovascular Auscultation:Rhythm- Regular rate and rhythm. Heart Sounds- S1 WNL and S2 WNL. Murmurs & Other Heart Sounds:Auscultation of the heart reveals - No Murmurs. Abdomen Palpation/Percussion:Tenderness- Abdomen is non-tender to palpation. Rigidity (guarding)- Abdomen is soft. Auscultation:Auscultation of the abdomen reveals - Bowel sounds normal. Male Genitourinary Not done, not pertinent to present illness Peripheral Vascular Upper Extremity: Palpation:- Pulses bilaterally normal. Lower Extremity: Palpation:- Pulses bilaterally normal. Neurologic Examination of related systems reveals - normal muscle strength and tone in all extremities. Neurologic evaluation reveals - normal  sensation and upper and lower extremity deep tendon reflexes intact bilaterally . Musculoskeletal ROM: - Full ROM of the hips. Right Knee: Inspection and Palpation: Tenderness - no tenderness to palpation of the medial joint line and no tenderness to palpation of the lateral joint line. Effusion - mild. Crepitus - moderate and severe. Alignment: Tibiofemoral alignment - varus alignment (Slight). ROM: Flexion: AROM - 105 . PROM - 115 . Extension: AROM - 3 . PROM - 3 . Instability - No instability about the knee. Lower Extremity Strength - Right - 5/5 throughout the lower extremity.   Radiographs: He has bone on bone arthritis in the medial and patellofemoral compartments of the right knee with tibial subluxation and varus deformity. His left knee replacement is in good position with no abnormalities.  Assessment & Plan Osteoarthritis, knee (715.96) Right total knee arthroplasty    Dimitri Ped, PA-C

## 2011-09-30 ENCOUNTER — Ambulatory Visit (INDEPENDENT_AMBULATORY_CARE_PROVIDER_SITE_OTHER): Payer: Medicare Other | Admitting: *Deleted

## 2011-09-30 DIAGNOSIS — I82409 Acute embolism and thrombosis of unspecified deep veins of unspecified lower extremity: Secondary | ICD-10-CM

## 2011-10-05 ENCOUNTER — Encounter (HOSPITAL_COMMUNITY): Payer: Self-pay | Admitting: *Deleted

## 2011-10-05 ENCOUNTER — Inpatient Hospital Stay (HOSPITAL_COMMUNITY): Payer: Medicare Other | Admitting: Anesthesiology

## 2011-10-05 ENCOUNTER — Other Ambulatory Visit: Payer: Self-pay

## 2011-10-05 ENCOUNTER — Encounter (HOSPITAL_COMMUNITY): Payer: Self-pay | Admitting: Anesthesiology

## 2011-10-05 ENCOUNTER — Inpatient Hospital Stay (HOSPITAL_COMMUNITY)
Admission: RE | Admit: 2011-10-05 | Discharge: 2011-10-09 | DRG: 470 | Disposition: A | Discharge status: Skilled Nursing Facility | Payer: Medicare Other | Source: Ambulatory Visit | Attending: Orthopedic Surgery | Admitting: Orthopedic Surgery

## 2011-10-05 ENCOUNTER — Encounter (HOSPITAL_COMMUNITY)
Admission: RE | Disposition: A | Discharge status: Skilled Nursing Facility | Payer: Self-pay | Source: Ambulatory Visit | Attending: Orthopedic Surgery

## 2011-10-05 DIAGNOSIS — D62 Acute posthemorrhagic anemia: Secondary | ICD-10-CM | POA: Diagnosis not present

## 2011-10-05 DIAGNOSIS — I2510 Atherosclerotic heart disease of native coronary artery without angina pectoris: Secondary | ICD-10-CM | POA: Diagnosis present

## 2011-10-05 DIAGNOSIS — Z8546 Personal history of malignant neoplasm of prostate: Secondary | ICD-10-CM

## 2011-10-05 DIAGNOSIS — E871 Hypo-osmolality and hyponatremia: Secondary | ICD-10-CM | POA: Diagnosis not present

## 2011-10-05 DIAGNOSIS — I428 Other cardiomyopathies: Secondary | ICD-10-CM | POA: Diagnosis present

## 2011-10-05 DIAGNOSIS — I10 Essential (primary) hypertension: Secondary | ICD-10-CM | POA: Diagnosis present

## 2011-10-05 DIAGNOSIS — I5022 Chronic systolic (congestive) heart failure: Secondary | ICD-10-CM

## 2011-10-05 DIAGNOSIS — E119 Type 2 diabetes mellitus without complications: Secondary | ICD-10-CM | POA: Diagnosis present

## 2011-10-05 DIAGNOSIS — I4949 Other premature depolarization: Secondary | ICD-10-CM

## 2011-10-05 DIAGNOSIS — R471 Dysarthria and anarthria: Secondary | ICD-10-CM | POA: Diagnosis present

## 2011-10-05 DIAGNOSIS — Z01812 Encounter for preprocedural laboratory examination: Secondary | ICD-10-CM

## 2011-10-05 DIAGNOSIS — I9589 Other hypotension: Secondary | ICD-10-CM | POA: Diagnosis not present

## 2011-10-05 DIAGNOSIS — M179 Osteoarthritis of knee, unspecified: Secondary | ICD-10-CM | POA: Diagnosis present

## 2011-10-05 DIAGNOSIS — Z96659 Presence of unspecified artificial knee joint: Secondary | ICD-10-CM

## 2011-10-05 DIAGNOSIS — M1710 Unilateral primary osteoarthritis, unspecified knee: Principal | ICD-10-CM | POA: Diagnosis present

## 2011-10-05 DIAGNOSIS — I493 Ventricular premature depolarization: Secondary | ICD-10-CM

## 2011-10-05 HISTORY — PX: TOTAL KNEE ARTHROPLASTY: SHX125

## 2011-10-05 LAB — CBC
HCT: 29.3 % — ABNORMAL LOW (ref 39.0–52.0)
Hemoglobin: 9.8 g/dL — ABNORMAL LOW (ref 13.0–17.0)
MCH: 32.3 pg (ref 26.0–34.0)
MCHC: 33.4 g/dL (ref 30.0–36.0)
RBC: 3.03 MIL/uL — ABNORMAL LOW (ref 4.22–5.81)

## 2011-10-05 LAB — PROTIME-INR: INR: 1.26 (ref 0.00–1.49)

## 2011-10-05 LAB — GLUCOSE, CAPILLARY
Glucose-Capillary: 122 mg/dL — ABNORMAL HIGH (ref 70–99)
Glucose-Capillary: 117 mg/dL — ABNORMAL HIGH (ref 70–99)

## 2011-10-05 SURGERY — ARTHROPLASTY, KNEE, TOTAL
Anesthesia: Spinal | Site: Knee | Laterality: Right | Wound class: Clean

## 2011-10-05 MED ORDER — LACTATED RINGERS IV SOLN: INTRAVENOUS | Status: DC

## 2011-10-05 MED ORDER — FLEET ENEMA 7-19 GM/118ML RE ENEM: 1.0000 | ENEMA | Freq: Once | RECTAL | Status: AC | PRN

## 2011-10-05 MED ORDER — WARFARIN VIDEO
Freq: Once | Status: AC
  Administered 2011-10-06: 12:00:00

## 2011-10-05 MED ORDER — DEXAMETHASONE SODIUM PHOSPHATE 10 MG/ML IJ SOLN
10.0000 mg | Freq: Once | INTRAMUSCULAR | Status: DC
  Filled 2011-10-05: qty 1

## 2011-10-05 MED ORDER — PNEUMOCOCCAL VAC POLYVALENT 25 MCG/0.5ML IJ INJ
0.5000 mL | INJECTION | INTRAMUSCULAR | Status: DC
  Filled 2011-10-05: qty 0.5

## 2011-10-05 MED ORDER — BUPIVACAINE 0.25 % ON-Q PUMP SINGLE CATH 300ML
INJECTION | Status: DC | PRN
  Administered 2011-10-05: 300 mL

## 2011-10-05 MED ORDER — CARVEDILOL 3.125 MG PO TABS
3.1250 mg | ORAL_TABLET | Freq: Two times a day (BID) | ORAL | Status: DC
  Administered 2011-10-05 – 2011-10-09 (×8): 3.125 mg via ORAL
  Filled 2011-10-05 (×9): qty 1

## 2011-10-05 MED ORDER — CEFAZOLIN SODIUM 1-5 GM-% IV SOLN
1.0000 g | Freq: Four times a day (QID) | INTRAVENOUS | Status: AC
  Administered 2011-10-06 (×2): 1 g via INTRAVENOUS
  Filled 2011-10-05 (×6): qty 50

## 2011-10-05 MED ORDER — ONDANSETRON HCL 4 MG/2ML IJ SOLN
INTRAMUSCULAR | Status: DC | PRN
  Administered 2011-10-05: 4 mg via INTRAVENOUS

## 2011-10-05 MED ORDER — DOCUSATE SODIUM 100 MG PO CAPS
100.0000 mg | ORAL_CAPSULE | Freq: Two times a day (BID) | ORAL | Status: DC
  Administered 2011-10-06 – 2011-10-09 (×8): 100 mg via ORAL
  Filled 2011-10-05 (×10): qty 1

## 2011-10-05 MED ORDER — MEPERIDINE HCL 50 MG/ML IJ SOLN: 6.2500 mg | INTRAMUSCULAR | Status: DC | PRN

## 2011-10-05 MED ORDER — MENTHOL 3 MG MT LOZG: 1.0000 | LOZENGE | OROMUCOSAL | Status: DC | PRN

## 2011-10-05 MED ORDER — BUPIVACAINE ON-Q PAIN PUMP (FOR ORDER SET NO CHG)
INJECTION | Status: DC
  Filled 2011-10-05: qty 1

## 2011-10-05 MED ORDER — POLYETHYLENE GLYCOL 3350 17 G PO PACK
17.0000 g | PACK | Freq: Every day | ORAL | Status: DC | PRN
  Filled 2011-10-05: qty 1

## 2011-10-05 MED ORDER — PHENOL 1.4 % MT LIQD: 1.0000 | OROMUCOSAL | Status: DC | PRN

## 2011-10-05 MED ORDER — SODIUM CHLORIDE 0.9 % IV SOLN
Freq: Once | INTRAVENOUS | Status: AC
  Administered 2011-10-05: 500 mL via INTRAVENOUS

## 2011-10-05 MED ORDER — SIMVASTATIN 20 MG PO TABS
20.0000 mg | ORAL_TABLET | Freq: Every day | ORAL | Status: DC
  Administered 2011-10-05 – 2011-10-08 (×4): 20 mg via ORAL
  Filled 2011-10-05 (×5): qty 1

## 2011-10-05 MED ORDER — ACETAMINOPHEN 10 MG/ML IV SOLN
1000.0000 mg | Freq: Once | INTRAVENOUS | Status: AC
  Administered 2011-10-05: 1000 mg via INTRAVENOUS
  Filled 2011-10-05: qty 100

## 2011-10-05 MED ORDER — METOCLOPRAMIDE HCL 5 MG/ML IJ SOLN: 5.0000 mg | Freq: Three times a day (TID) | INTRAMUSCULAR | Status: DC | PRN

## 2011-10-05 MED ORDER — ENOXAPARIN SODIUM 30 MG/0.3ML ~~LOC~~ SOLN
30.0000 mg | Freq: Two times a day (BID) | SUBCUTANEOUS | Status: DC
  Administered 2011-10-06 – 2011-10-08 (×5): 30 mg via SUBCUTANEOUS
  Filled 2011-10-05 (×6): qty 0.3

## 2011-10-05 MED ORDER — LOSARTAN POTASSIUM 25 MG PO TABS
25.0000 mg | ORAL_TABLET | Freq: Every morning | ORAL | Status: DC
  Administered 2011-10-06 – 2011-10-09 (×3): 25 mg via ORAL
  Filled 2011-10-05 (×4): qty 1

## 2011-10-05 MED ORDER — ONDANSETRON HCL 4 MG/2ML IJ SOLN: 4.0000 mg | Freq: Four times a day (QID) | INTRAMUSCULAR | Status: DC | PRN

## 2011-10-05 MED ORDER — METHOCARBAMOL 500 MG PO TABS
500.0000 mg | ORAL_TABLET | Freq: Four times a day (QID) | ORAL | Status: DC | PRN
  Administered 2011-10-06 – 2011-10-08 (×6): 500 mg via ORAL
  Filled 2011-10-05 (×7): qty 1

## 2011-10-05 MED ORDER — FAMOTIDINE 20 MG PO TABS
20.0000 mg | ORAL_TABLET | Freq: Once | ORAL | Status: AC
  Administered 2011-10-05: 20 mg via ORAL
  Filled 2011-10-05: qty 1

## 2011-10-05 MED ORDER — ONDANSETRON HCL 4 MG PO TABS: 4.0000 mg | ORAL_TABLET | Freq: Four times a day (QID) | ORAL | Status: DC | PRN

## 2011-10-05 MED ORDER — PREGABALIN 75 MG PO CAPS
75.0000 mg | ORAL_CAPSULE | Freq: Every day | ORAL | Status: DC
  Administered 2011-10-05 – 2011-10-09 (×5): 75 mg via ORAL
  Filled 2011-10-05 (×5): qty 1

## 2011-10-05 MED ORDER — FAMOTIDINE 20 MG PO TABS
20.0000 mg | ORAL_TABLET | Freq: Two times a day (BID) | ORAL | Status: DC
  Administered 2011-10-05 – 2011-10-09 (×8): 20 mg via ORAL
  Filled 2011-10-05 (×9): qty 1

## 2011-10-05 MED ORDER — BUPIVACAINE 0.25 % ON-Q PUMP SINGLE CATH 300ML: 300.0000 mL | INJECTION | Status: DC

## 2011-10-05 MED ORDER — SODIUM CHLORIDE 0.9 % IV SOLN: INTRAVENOUS | Status: DC

## 2011-10-05 MED ORDER — DICYCLOMINE HCL 20 MG PO TABS
20.0000 mg | ORAL_TABLET | Freq: Three times a day (TID) | ORAL | Status: DC | PRN
  Filled 2011-10-05: qty 1

## 2011-10-05 MED ORDER — DIPHENHYDRAMINE HCL 12.5 MG/5ML PO ELIX: 12.5000 mg | ORAL_SOLUTION | ORAL | Status: DC | PRN

## 2011-10-05 MED ORDER — METOCLOPRAMIDE HCL 10 MG PO TABS: 5.0000 mg | ORAL_TABLET | Freq: Three times a day (TID) | ORAL | Status: DC | PRN

## 2011-10-05 MED ORDER — WARFARIN - PHARMACIST DOSING INPATIENT: Freq: Every day | Status: DC

## 2011-10-05 MED ORDER — TEMAZEPAM 15 MG PO CAPS: 15.0000 mg | ORAL_CAPSULE | Freq: Every evening | ORAL | Status: DC | PRN

## 2011-10-05 MED ORDER — ACETAMINOPHEN 650 MG RE SUPP: 650.0000 mg | Freq: Four times a day (QID) | RECTAL | Status: DC | PRN

## 2011-10-05 MED ORDER — CEFAZOLIN SODIUM-DEXTROSE 2-3 GM-% IV SOLR
2.0000 g | Freq: Once | INTRAVENOUS | Status: AC
  Administered 2011-10-05: 2 g via INTRAVENOUS

## 2011-10-05 MED ORDER — FUROSEMIDE 80 MG PO TABS
80.0000 mg | ORAL_TABLET | Freq: Every morning | ORAL | Status: DC
  Administered 2011-10-06 – 2011-10-07 (×2): 80 mg via ORAL
  Filled 2011-10-05 (×3): qty 1

## 2011-10-05 MED ORDER — METHOCARBAMOL 100 MG/ML IJ SOLN
500.0000 mg | Freq: Four times a day (QID) | INTRAVENOUS | Status: DC | PRN
  Administered 2011-10-05: 500 mg via INTRAVENOUS
  Filled 2011-10-05: qty 5

## 2011-10-05 MED ORDER — COUMADIN BOOK
Freq: Once | Status: AC
  Administered 2011-10-05: 20:00:00
  Filled 2011-10-05: qty 1

## 2011-10-05 MED ORDER — WARFARIN SODIUM 4 MG PO TABS
4.0000 mg | ORAL_TABLET | Freq: Once | ORAL | Status: AC
  Administered 2011-10-05: 4 mg via ORAL
  Filled 2011-10-05: qty 1

## 2011-10-05 MED ORDER — PROPOFOL 10 MG/ML IV EMUL
INTRAVENOUS | Status: DC | PRN
  Administered 2011-10-05: 75 ug/kg/min via INTRAVENOUS

## 2011-10-05 MED ORDER — EPHEDRINE SULFATE 50 MG/ML IJ SOLN
INTRAMUSCULAR | Status: DC | PRN
  Administered 2011-10-05: 10 mg via INTRAVENOUS

## 2011-10-05 MED ORDER — LACTATED RINGERS IV SOLN
INTRAVENOUS | Status: DC | PRN
  Administered 2011-10-05 (×2): via INTRAVENOUS

## 2011-10-05 MED ORDER — ACETAMINOPHEN 325 MG PO TABS
650.0000 mg | ORAL_TABLET | Freq: Four times a day (QID) | ORAL | Status: DC | PRN
  Administered 2011-10-08 (×2): 650 mg via ORAL
  Filled 2011-10-05 (×2): qty 2

## 2011-10-05 MED ORDER — PROMETHAZINE HCL 25 MG/ML IJ SOLN: 6.2500 mg | INTRAMUSCULAR | Status: DC | PRN

## 2011-10-05 MED ORDER — HYDROMORPHONE HCL PF 1 MG/ML IJ SOLN: 0.2500 mg | INTRAMUSCULAR | Status: DC | PRN

## 2011-10-05 MED ORDER — BUPIVACAINE IN DEXTROSE 0.75-8.25 % IT SOLN
INTRATHECAL | Status: DC | PRN
  Administered 2011-10-05: 1.8 mL via INTRATHECAL

## 2011-10-05 MED ORDER — CHLORHEXIDINE GLUCONATE 4 % EX LIQD: 60.0000 mL | Freq: Once | CUTANEOUS | Status: DC

## 2011-10-05 MED ORDER — PANTOPRAZOLE SODIUM 40 MG PO TBEC
80.0000 mg | DELAYED_RELEASE_TABLET | Freq: Every day | ORAL | Status: DC
  Filled 2011-10-05: qty 2

## 2011-10-05 MED ORDER — SODIUM CHLORIDE 0.9 % IV SOLN
INTRAVENOUS | Status: DC
  Administered 2011-10-05 – 2011-10-06 (×3): via INTRAVENOUS

## 2011-10-05 MED ORDER — MORPHINE SULFATE 2 MG/ML IJ SOLN
1.0000 mg | INTRAMUSCULAR | Status: DC | PRN
  Administered 2011-10-05: 1 mg via INTRAVENOUS
  Administered 2011-10-05: 2 mg via INTRAVENOUS
  Filled 2011-10-05 (×2): qty 1

## 2011-10-05 MED ORDER — OXYCODONE HCL 5 MG PO TABS: 5.0000 mg | ORAL_TABLET | ORAL | Status: DC | PRN

## 2011-10-05 MED ORDER — ACETAMINOPHEN 10 MG/ML IV SOLN
1000.0000 mg | Freq: Four times a day (QID) | INTRAVENOUS | Status: AC
  Administered 2011-10-05 – 2011-10-06 (×4): 1000 mg via INTRAVENOUS
  Filled 2011-10-05 (×4): qty 100

## 2011-10-05 MED ORDER — SODIUM CHLORIDE 0.9 % IR SOLN
Status: DC | PRN
  Administered 2011-10-05: 1000 mL

## 2011-10-05 MED ORDER — BISACODYL 10 MG RE SUPP: 10.0000 mg | Freq: Every day | RECTAL | Status: DC | PRN

## 2011-10-05 MED ORDER — TRAMADOL HCL 50 MG PO TABS
50.0000 mg | ORAL_TABLET | Freq: Four times a day (QID) | ORAL | Status: DC | PRN
  Administered 2011-10-07 – 2011-10-09 (×3): 50 mg via ORAL
  Filled 2011-10-05 (×3): qty 1

## 2011-10-05 MED ORDER — LACTATED RINGERS IV SOLN
INTRAVENOUS | Status: DC
  Administered 2011-10-05: 15:00:00 via INTRAVENOUS
  Administered 2011-10-05: 1000 mL via INTRAVENOUS

## 2011-10-05 SURGICAL SUPPLY — 53 items
BAG SPEC THK2 15X12 ZIP CLS (MISCELLANEOUS) ×1
BAG ZIPLOCK 12X15 (MISCELLANEOUS) ×2 IMPLANT
BANDAGE ELASTIC 6 VELCRO ST LF (GAUZE/BANDAGES/DRESSINGS) ×2 IMPLANT
BANDAGE ESMARK 6X9 LF (GAUZE/BANDAGES/DRESSINGS) ×1 IMPLANT
BLADE SAG 18X100X1.27 (BLADE) ×2 IMPLANT
BLADE SAW SGTL 11.0X1.19X90.0M (BLADE) ×2 IMPLANT
BNDG CMPR 9X6 STRL LF SNTH (GAUZE/BANDAGES/DRESSINGS) ×1
BNDG ESMARK 6X9 LF (GAUZE/BANDAGES/DRESSINGS) ×2
BOWL SMART MIX CTS (DISPOSABLE) ×2 IMPLANT
CATH KIT ON-Q SILVERSOAK 5 (CATHETERS) ×1 IMPLANT
CATH KIT ON-Q SILVERSOAK 5IN (CATHETERS) ×2 IMPLANT
CEMENT HV SMART SET (Cement) ×4 IMPLANT
CLOTH BEACON ORANGE TIMEOUT ST (SAFETY) ×2 IMPLANT
CLSR STERI-STRIP ANTIMIC 1/2X4 (GAUZE/BANDAGES/DRESSINGS) ×1 IMPLANT
CUFF TOURN SGL QUICK 34 (TOURNIQUET CUFF) ×2
CUFF TRNQT CYL 34X4X40X1 (TOURNIQUET CUFF) ×1 IMPLANT
DRAPE EXTREMITY T 121X128X90 (DRAPE) ×2 IMPLANT
DRAPE POUCH INSTRU U-SHP 10X18 (DRAPES) ×2 IMPLANT
DRAPE U-SHAPE 47X51 STRL (DRAPES) ×2 IMPLANT
DRSG ADAPTIC 3X8 NADH LF (GAUZE/BANDAGES/DRESSINGS) ×2 IMPLANT
DRSG PAD ABDOMINAL 8X10 ST (GAUZE/BANDAGES/DRESSINGS) ×1 IMPLANT
DRSG TEGADERM 4X4.75 (GAUZE/BANDAGES/DRESSINGS) ×1 IMPLANT
DURAPREP 26ML APPLICATOR (WOUND CARE) ×2 IMPLANT
ELECT REM PT RETURN 9FT ADLT (ELECTROSURGICAL) ×2
ELECTRODE REM PT RTRN 9FT ADLT (ELECTROSURGICAL) ×1 IMPLANT
EVACUATOR 1/8 PVC DRAIN (DRAIN) ×2 IMPLANT
FACESHIELD LNG OPTICON STERILE (SAFETY) ×10 IMPLANT
GLOVE BIO SURGEON STRL SZ7.5 (GLOVE) ×2 IMPLANT
GLOVE BIO SURGEON STRL SZ8 (GLOVE) ×2 IMPLANT
GLOVE BIOGEL PI IND STRL 8 (GLOVE) ×2 IMPLANT
GLOVE BIOGEL PI INDICATOR 8 (GLOVE) ×2
GOWN STRL NON-REIN LRG LVL3 (GOWN DISPOSABLE) ×2 IMPLANT
GOWN STRL REIN XL XLG (GOWN DISPOSABLE) ×2 IMPLANT
HANDPIECE INTERPULSE COAX TIP (DISPOSABLE) ×2
IMMOBILIZER KNEE 20 (SOFTGOODS) ×2
IMMOBILIZER KNEE 20 THIGH 36 (SOFTGOODS) ×1 IMPLANT
KIT BASIN OR (CUSTOM PROCEDURE TRAY) ×2 IMPLANT
MANIFOLD NEPTUNE II (INSTRUMENTS) ×2 IMPLANT
NS IRRIG 1000ML POUR BTL (IV SOLUTION) ×2 IMPLANT
PACK TOTAL JOINT (CUSTOM PROCEDURE TRAY) ×2 IMPLANT
PADDING CAST COTTON 6X4 STRL (CAST SUPPLIES) ×4 IMPLANT
POSITIONER SURGICAL ARM (MISCELLANEOUS) ×2 IMPLANT
SET HNDPC FAN SPRY TIP SCT (DISPOSABLE) ×1 IMPLANT
SPONGE GAUZE 4X4 12PLY (GAUZE/BANDAGES/DRESSINGS) ×2 IMPLANT
STRIP CLOSURE SKIN 1/2X4 (GAUZE/BANDAGES/DRESSINGS) ×4 IMPLANT
SUCTION FRAZIER 12FR DISP (SUCTIONS) ×2 IMPLANT
SUT MNCRL AB 4-0 PS2 18 (SUTURE) ×2 IMPLANT
SUT VIC AB 2-0 CT1 27 (SUTURE) ×6
SUT VIC AB 2-0 CT1 TAPERPNT 27 (SUTURE) ×3 IMPLANT
TOWEL OR 17X26 10 PK STRL BLUE (TOWEL DISPOSABLE) ×4 IMPLANT
TRAY FOLEY CATH 14FRSI W/METER (CATHETERS) ×2 IMPLANT
WATER STERILE IRR 1500ML POUR (IV SOLUTION) ×2 IMPLANT
WRAP KNEE MAXI GEL POST OP (GAUZE/BANDAGES/DRESSINGS) ×3 IMPLANT

## 2011-10-05 NOTE — Preoperative (Signed)
Beta Blockers   Reason not to administer Beta Blockers:Not Applicable 

## 2011-10-05 NOTE — Significant Event (Signed)
Rapid Response Event Note  Overview: Time Called: 2130 Arrival Time: 2132 Event Type: Cardiac;Hypotension  Initial Focused Assessment: Pt. Had right total knee today. Hypotensive and had brief bradycardia. Pt. Was symptomatic and upon arrival to room pale and diaphoretic. Total of 900cc from right knee hemovac. Pt was alert and oriented and able to explain how he felt and upon arrival states "I am coming back around".  Interventions: 500cc NS bolus, EKG done and MD notified--STAT CBC to be done   Event Summary: Pt stayed in room and v/s improved with NS bolus, no chest pain Name of Physician Notified: JOHN HEWITT at 2135    at    Outcome: Stayed in room and stabalized  Event End Time: 2230  Bayard Hugger

## 2011-10-05 NOTE — Progress Notes (Signed)
Patient c/o pain  2 mg of morphine given. 20 min. Later patient became diaphoretic and pale looking. B/p obtained it was 90/43, hr 31, temp 98.7, sat 99 2l. Dr hewitt notified. New orders received. Rapid response nurse page to evaluate patient EKG obtain showing SR with PVC's and saline bolus given. Awaiting cbc results.will continue to monitor

## 2011-10-05 NOTE — Anesthesia Preprocedure Evaluation (Addendum)
Anesthesia Evaluation  Patient identified by MRN, date of birth, ID band Patient awake    Reviewed: Allergy & Precautions, H&P , NPO status , Patient's Chart, lab work & pertinent test results  Airway Mallampati: II TM Distance: >3 FB Neck ROM: Full    Dental No notable dental hx.    Pulmonary neg pulmonary ROS,  breath sounds clear to auscultation  Pulmonary exam normal       Cardiovascular hypertension, Pt. on medications + CAD (nonobstructive) negative cardio ROS  + dysrhythmias IIRhythm:Regular Rate:Normal  lbbb Nonischemic cardiomyopathy ef 25%   Neuro/Psych negative neurological ROS  negative psych ROS   GI/Hepatic negative GI ROS, Neg liver ROS,   Endo/Other  negative endocrine ROSDiabetes mellitus-  Renal/GU negative Renal ROS  negative genitourinary   Musculoskeletal negative musculoskeletal ROS (+)   Abdominal   Peds negative pediatric ROS (+)  Hematology negative hematology ROS (+)   Anesthesia Other Findings   Reproductive/Obstetrics negative OB ROS                          Anesthesia Physical Anesthesia Plan  ASA: III  Anesthesia Plan:    Post-op Pain Management:    Induction:   Airway Management Planned:   Additional Equipment:   Intra-op Plan:   Post-operative Plan:   Informed Consent: I have reviewed the patients History and Physical, chart, labs and discussed the procedure including the risks, benefits and alternatives for the proposed anesthesia with the patient or authorized representative who has indicated his/her understanding and acceptance.   Dental advisory given  Plan Discussed with: CRNA  Anesthesia Plan Comments:         Anesthesia Quick Evaluation

## 2011-10-05 NOTE — Anesthesia Procedure Notes (Signed)
Spinal  Patient location during procedure: OR Staffing Anesthesiologist: Brysan Mcevoy Performed by: anesthesiologist  Preanesthetic Checklist Completed: patient identified, site marked, surgical consent, pre-op evaluation, timeout performed, IV checked, risks and benefits discussed and monitors and equipment checked Spinal Block Patient position: sitting Prep: Betadine Patient monitoring: heart rate, continuous pulse ox and blood pressure Location: L2-3 Injection technique: single-shot Needle Needle type: Spinocan  Needle gauge: 22 G Needle length: 9 cm Additional Notes Expiration date of kit checked and confirmed. Patient tolerated procedure well, without complications.     

## 2011-10-05 NOTE — Interval H&P Note (Signed)
History and Physical Interval Note:  10/05/2011 11:34 AM  [name] [name]  has presented today for surgery, with the diagnosis of osteoarthritis right knee  The various methods of treatment have been discussed with the patient and family. After consideration of risks, benefits and other options for treatment, the patient has consented to  Procedure(s) (LRB): TOTAL KNEE ARTHROPLASTY (Right) as a surgical intervention .  The patients' history has been reviewed, patient examined, no change in status, stable for surgery.  I have reviewed the patients' chart and labs.  Questions were answered to the patient's satisfaction.     Loanne Drilling

## 2011-10-05 NOTE — Op Note (Signed)
Pre-operative diagnosis- Osteoarthritis  Right knee(s)  Post-operative diagnosis- Osteoarthritis Right knee(s)  Procedure-  Right  Total Knee Arthroplasty  Surgeon- Gus Rankin. Xzaria Teo, MD  Assistant- Avel Peace, PA-C   Anesthesia-  Spinal EBL-* No blood loss amount entered *  Drains Hemovac  Tourniquet time-  Total Tourniquet Time Documented: Thigh (Right) - 31 minutes   Complications- None  Condition-PACU - hemodynamically stable.   Brief Clinical Note  [name] [name] is a 78 y.o. year old male with end stage OA of his right knee with progressively worsening pain and dysfunction. He has constant pain, with activity and at rest and significant functional deficits with difficulties even with ADLs. He has had extensive non-op management including analgesics, injections of cortisone and viscosupplements, and home exercise program, but remains in significant pain with significant dysfunction. Radiographs demonstrate bone on bone arthritis of the medial and patellofemoral compartments with subchondral sclerosis and varus deformity. He presents now for right Total Knee Arthroplasty.    Procedure in detail---   The patient is brought into the operating room and positioned supine on the operating table. After successful administration of  Spinal,   a tourniquet is placed high on the  Right thigh(s) and the lower extremity is prepped and draped in the usual sterile fashion. Time out is performed by the operating team and then the  Right lower extremity is wrapped in Esmarch, knee flexed and the tourniquet inflated to 300 mmHg.       A midline incision is made with a ten blade through the subcutaneous tissue to the level of the extensor mechanism. A fresh blade is used to make a medial parapatellar arthrotomy. Soft tissue over the proximal medial tibia is subperiosteally elevated to the joint line with a knife and into the semimembranosus bursa with a Cobb elevator. Soft tissue over the proximal  lateral tibia is elevated with attention being paid to avoiding the patellar tendon on the tibial tubercle. The patella is everted, knee flexed 90 degrees and the ACL and PCL are removed. Findings are bone on bone medial and patellofemoral with large osteophytes medial and patellofemoral.        The drill is used to create a starting hole in the distal femur and the canal is thoroughly irrigated with sterile saline to remove the fatty contents. The 5 degree Right  valgus alignment guide is placed into the femoral canal and the distal femoral cutting block is pinned to remove 11 mm off the distal femur. Resection is made with an oscillating saw.      The tibia is subluxed forward and the menisci are removed. The extramedullary alignment guide is placed referencing proximally at the medial aspect of the tibial tubercle and distally along the second metatarsal axis and tibial crest. The block is pinned to remove 2mm off the more deficient medial  side. Resection is made with an oscillating saw. Size 4is the most appropriate size for the tibia and the proximal tibia is prepared with the modular drill and keel punch for that size.      The femoral sizing guide is placed and size 5 is most appropriate. Rotation is marked off the epicondylar axis and confirmed by creating a rectangular flexion gap at 90 degrees. The size 5 cutting block is pinned in this rotation and the anterior, posterior and chamfer cuts are made with the oscillating saw. The intercondylar block is then placed and that cut is made.      Trial size 4 tibial  component, trial size 5 posterior stabilized femur and a 12.5  mm posterior stabilized rotating platform insert trial is placed. Full extension is achieved with excellent varus/valgus and anterior/posterior balance throughout full range of motion. The patella is everted and thickness measured to be 22  mm. Free hand resection is taken to 12 mm, a 38 template is placed, lug holes are drilled, trial  patella is placed, and it tracks normally. Osteophytes are removed off the posterior femur with the trial in place. All trials are removed and the cut bone surfaces prepared with pulsatile lavage. Cement is mixed and once ready for implantation, the size 4 tibial implant, size  5 posterior stabilized femoral component, and the size 38 patella are cemented in place and the patella is held with the clamp. The trial insert is placed and the knee held in full extension. All extruded cement is removed and once the cement is hard the permanent 12.5 mm posterior stabilized rotating platform insert is placed into the tibial tray.      The wound is copiously irrigated with saline solution and the extensor mechanism closed over a hemovac drain with #1 PDS suture. The tourniquet is released for a total tourniquet time of 31  minutes. Flexion against gravity is 140 degrees and the patella tracks normally. Subcutaneous tissue is closed with 2.0 vicryl and subcuticular with running 4.0 Monocryl. The catheter for the Marcaine pain pump is placed and the pump is initiated. The incision is cleaned and dried and steri-strips and a bulky sterile dressing are applied. The limb is placed into a knee immobilizer and the patient is awakened and transported to recovery in stable condition.      Please note that a surgical assistant was a medical necessity for this procedure in order to perform it in a safe and expeditious manner. Surgical assistant was necessary to retract the ligaments and vital neurovascular structures to prevent injury to them and also necessary for proper positioning of the limb to allow for anatomic placement of the prosthesis.   Gus Rankin Tallon Gertz, MD    10/05/2011, 12:57 PM

## 2011-10-05 NOTE — Transfer of Care (Signed)
Immediate Anesthesia Transfer of Care Note  Patient: [name] [name]  Procedure(s) Performed: Procedure(s) (LRB): TOTAL KNEE ARTHROPLASTY (Right)  Patient Location: PACU  Anesthesia Type: Spinal  Level of Consciousness: awake, alert , oriented and patient cooperative  Airway & Oxygen Therapy: Patient Spontanous Breathing and Patient connected to face mask oxygen  Post-op Assessment: Report given to PACU RN and Post -op Vital signs reviewed and stable  Post vital signs: Reviewed and stable  Complications: No apparent anesthesia complications

## 2011-10-05 NOTE — Consult Note (Signed)
CARDIOLOGY CONSULT NOTE   Patient ID: GRACYN SANTILLANES MRN: [id number] DOB/AGE: October 02, 1922 78 y.o.  Admit date: 10/05/2011  Primary Physician   Michele Mcalpine, MD, MD Primary Cardiologist   DM Reason for Consultation   Medical management after TKR  WUJ:WJXB L Westbrooks is a 78 y.o. male with a history of NICM. He was seen by Dr Shirlee Latch in January and felt to be at acceptable risk for TKR if done without general anesthesia. Mr Morgenstern had the surgery today and cardiology was asked to evaluate him post-op for medical/volume management.    Mr Stacey is doing well post-op. Prior to admission, his weight has been stable and he has been as active as his knee would allow. He has had no chest pain. He is compliant with a low-sodium diet and with his meds and has not had any weight gain or LE edema. Post-op he feels well. He is having frequent PVCs and bigeminy at times but is not aware of it. He is not having SOB or chest pain.    Past Medical History  Diagnosis Date  . Coronary atherosclerosis of unspecified type of vessel, native or graft - non-obstructive   . Other primary cardiomyopathies   . Other left bundle branch block   . DVT (deep venous thrombosis)     Hx of it. -7 yrs ago  . Other and unspecified hyperlipidemia   . Type II or unspecified type diabetes mellitus without mention of complication, not stated as uncontrolled   . Esophageal reflux   . Diverticulosis of colon (without mention of hemorrhage)   . Irritable bowel syndrome   . Personal history of malignant neoplasm of prostate   . DJD (degenerative joint disease)   . Spinal stenosis, lumbar region, without neurogenic claudication   . Unspecified hereditary and idiopathic peripheral neuropathy   . Nonischemic cardiomyopathy     EF 35-40%  . Coronary disease     nonobstructive coronary disease at catheterzation 2002.   Marland Kitchen DVT (deep venous thrombosis)     Hx of it on Coumadin therapy  . Sinus bradycardia     requiring  discontinuation of Toprol   . Prostate cancer     s/p radiation  . Diabetes mellitus   . Hyperlipidemia     with abnormal liver function tests on Zocor     Past Surgical History  Procedure Date  . Appendectomy   . Knee arthroscopy     L knee. 1991 by Dr. Criss Alvine  . Ercp with sphincterotomy and stone removal     5/93 by Dr. Russella Dar  . Bilat inguinal hernia repair     1998 by Dr. Samuella Cota  . Radium seed implantation for prostate cancer     s/p. 1/00 by Dr. Vonita Moss and Dan Humphreys  . R cataract surgery     2004 by Dr. Elmer Picker  . Repair of right recurrent right inguinal hernia     12/07 by Dr. Lurene Shadow  . Basal cell cancer     L ear. s/p Moh's surgery 3/09 Dr. Alean Rinne  . Total knee arthroplasty     Left. 8/08 by Dr. Charlann Boxer  . Tonsillectomy 09-28-11    child  . Cholecystectomy     1985 by Dr. Julien Girt    No Known Allergies  I have reviewed the patient's current medications. Prescriptions prior to admission  Medication Sig Dispense Refill  . Calcium Carbonate-Vitamin D (CALTRATE 600+D) 600-400 MG-UNIT per tablet Take 1 tablet by mouth daily.       Marland Kitchen  carvedilol (COREG) 3.125 MG tablet Take 1 tablet (3.125 mg total) by mouth 2 (two) times daily.  60 tablet  6  . Cholecalciferol (VITAMIN D-1000 MAX ST) 1000 UNITS tablet Take 1,000 Units by mouth daily.        . Cyanocobalamin (VITAMIN B-12 PO) Take 1 tablet by mouth daily.      Marland Kitchen dicyclomine (BENTYL) 20 MG tablet Take 20 mg by mouth 3 (three) times daily as needed. irritable bowel syndrome      . esomeprazole (NEXIUM) 40 MG capsule Take 1 capsule (40 mg total) by mouth daily before breakfast. 30 min before first meal of day.  90 capsule  3  . fish oil-omega-3 fatty acids 1000 MG capsule Take 1 g by mouth daily.      . furosemide (LASIX) 40 MG tablet Take 80 mg by mouth every morning.       Marland Kitchen HYDROcodone-acetaminophen (VICODIN) 5-500 MG per tablet Take 1 tablet by mouth every 6 (six) hours as needed. Pain      . losartan (COZAAR) 50 MG  tablet Take 25 mg by mouth every morning.       . meloxicam (MOBIC) 7.5 MG tablet Take 7.5 mg by mouth daily as needed. For arthritis      . Multiple Vitamin (MULTIVITAMIN) tablet Take 1 tablet by mouth daily.        . pravastatin (PRAVACHOL) 40 MG tablet Take 1 tablet (40 mg total) by mouth at bedtime.  90 tablet  3  . pregabalin (LYRICA) 75 MG capsule Take 1 capsule (75 mg total) by mouth daily.  30 capsule  5  . ranitidine (ZANTAC) 150 MG tablet Take 150 mg by mouth 2 (two) times daily as needed. Heart burn      . warfarin (COUMADIN) 2 MG tablet Take 2-4 mg by mouth daily. 1 tablet on Friday and 2 tablets all other days          History   Social History  . Marital Status: Married    Spouse Name: N/A    Number of Children: 0  . Years of Education: N/A   Occupational History  .     Social History Main Topics  . Smoking status: Former Smoker    Quit date: 07/20/1985  . Smokeless tobacco: Not on file  . Alcohol Use: No  . Drug Use: No  . Sexually Active: Not on file   Other Topics Concern  . Not on file   Social History Narrative   Married, no children.      Family History  Problem Relation Age of Onset  . Throat cancer Brother   . Coronary artery disease Father     deceased     ROS: He has chronic M-S aches and pains, especially his right knee. He has had no GI symptoms. He does not get chest pain with exertion. His weight is stable. He denies DOE, orthopnea or PND. He has not had any illnesses, fevers or chills. Full 14 point review of systems complete and found to be negative unless listed  above  Physical Exam: Blood pressure 108/50, pulse 30, temperature 96.9 F (36.1 C), temperature source Oral, resp. rate 15, SpO2 100.00%.   General: Well developed, well nourished, elderly male, in no acute distress Head: Eyes PERRLA, No xanthomas.   Normocephalic and atraumatic, oropharynx without edema or exudate. Dentition - poor Lungs: Clear bilaterally to auscultation    Heart:  Heart slightly irregular rate and rhythm with S1, S2  No significant murmur. pulses are 2+ & equal all 4 extrem.   Neck: No carotid bruit. No lymphadenopathy.  JVD not elevated. Abdomen: Bowel sounds present, abdomen soft and non-tender without masses or hernias noted. Msk:  No spine or cva tenderness. No joint deformities or effusions noted. Right knee bandaged and dressed - no drainage, not disturbed. Extremities: No clubbing or cyanosis.  No edema.  Neuro: Alert and oriented X 3. No focal deficits noted. Psych:  Good affect, responds appropriately Skin: No rashes or lesions noted.  Labs:   Lab Results  Component Value Date   WBC 5.1 09/28/2011   HGB 11.9* 09/28/2011   HCT 35.6* 09/28/2011   MCV 95.2 09/28/2011   PLT 200 09/28/2011    Basename 10/05/11 0915  INR 1.26    Lab 09/28/11 1530  NA 140  K 4.4  CL 103  CO2 29  BUN 25*  CREATININE 1.11  CALCIUM 10.1  PROT 7.2  BILITOT 0.4  ALKPHOS 49  ALT 15  AST 23  GLUCOSE 156*   Echo: 01/28/2011 Study Conclusions - Left ventricle: The cavity size was moderately dilated. Wall thickness was normal. The estimated ejection fraction was 25%. Diffuse hypokinesis. - Aortic valve: Trivial regurgitation. - Left atrium: The atrium was mildly dilated. - Atrial septum: No defect or patent foramen ovale was identified. - Pulmonary arteries: PA peak pressure: 49mm Hg (S).  Radiology: 08/26/2011 CHEST - 2 VIEW Comparison: Chest x-ray of 06/18/2006 Findings: The lungs are clear but somewhat hyperaerated. Mediastinal contours appear stable. The heart is mildly enlarged and stable. No acute bony abnormality is seen. IMPRESSION: Hyperaeration consistent with COPD. Stable mild cardiomegaly.  EKG: pending   ASSESSMENT AND PLAN:   The patient was seen today by Dr Elease Hashimoto, the patient evaluated and the data reviewed.   Principal Problem:  *OA (osteoarthritis) of knee - per ortho  NICM - No Sx volume overload at this time. With  his arrhythmia as well as history of NICM, would keep on telemetry for 24 hours, ck Mg and follow K+ as well. Keep I/O approx equal and follow weights.  Otherwise, per Dr Despina Hick.  Signed: Theodore Demark 10/05/2011, 2:42 PM Co-Sign MD  Attending Note:   The patient was seen and examined.  Agree with assessment and plan as noted above.  Pt is known to me from previous visits.  He is doing well after right knee surgery.  He is having some PVCs but he was having them pre op as well.  No CP, dyspnea.  Sats are OK.  Lungs : clear Cor: irreg Abd: soft, good BS.  Will check Mag. And K.  I think we can watch him overnight on tele and then transfer him to the ortho floor tomorrow.  Will get an ECG in the AM.  He is not having any signs or symptoms of coronary ischemia so  We do not need to draw cardiac enzymes.  If he is stable tomorrow, he can go to ortho floor and we will sign off.   Vesta Mixer, Montez Hageman., MD, [facility] 10/05/2011, 3:14 PM

## 2011-10-05 NOTE — Anesthesia Postprocedure Evaluation (Signed)
  Anesthesia Post-op Note  Patient: [name] [name]  Procedure(s) Performed: Procedure(s) (LRB): TOTAL KNEE ARTHROPLASTY (Right)  Patient Location: PACU  Anesthesia Type: Spinal  Level of Consciousness: awake and alert   Airway and Oxygen Therapy: Patient Spontanous Breathing  Post-op Pain: mild  Post-op Assessment: Post-op Vital signs reviewed, Patient's Cardiovascular Status Stable, Respiratory Function Stable, Patent Airway and No signs of Nausea or vomiting  Post-op Vital Signs: stable  Complications: No apparent anesthesia complications

## 2011-10-05 NOTE — Progress Notes (Addendum)
ANTICOAGULATION CONSULT NOTE - Initial Consult  Pharmacy Consult for Warfarin Indication: vte prophylaxis s/p R TKA/Hx DVT  No Known Allergies  Patient Measurements: Height: 5\' 10"  (177.8 cm) Weight: 191 lb 12.8 oz (87 kg) IBW/kg (Calculated) : 73    Vital Signs: Temp: 97.4 F (36.3 C) (03/18 1703) Temp src: Oral (03/18 0854) BP: 121/55 mmHg (03/18 1703) Pulse Rate: 67  (03/18 1703)  Labs:  Basename 10/05/11 0915  HGB --  HCT --  PLT --  APTT --  LABPROT 16.1*  INR 1.26  HEPARINUNFRC --  CREATININE --  CKTOTAL --  CKMB --  TROPONINI --   Estimated Creatinine Clearance: 49.3 ml/min (by C-G formula based on Cr of 1.11).  Medical History: Past Medical History  Diagnosis Date  . Coronary atherosclerosis of unspecified type of vessel, native or graft   . Other primary cardiomyopathies   . Other left bundle branch block   . DVT (deep venous thrombosis)     Hx of it. -7 yrs ago  . Other and unspecified hyperlipidemia   . Type II or unspecified type diabetes mellitus without mention of complication, not stated as uncontrolled   . Esophageal reflux   . Diverticulosis of colon (without mention of hemorrhage)   . Irritable bowel syndrome   . Personal history of malignant neoplasm of prostate   . DJD (degenerative joint disease)   . Spinal stenosis, lumbar region, without neurogenic claudication   . Unspecified hereditary and idiopathic peripheral neuropathy   . Nonischemic cardiomyopathy     EF 35-40%  . Coronary disease     nonobstructive coronary disease at catheterzation 2002.   Marland Kitchen DVT (deep venous thrombosis)     Hx of it on Coumadin therapy  . Sinus bradycardia     requiring discontinuation of Toprol   . Prostate cancer     s/p radiation  . Diabetes mellitus   . Hyperlipidemia     with abnormal liver function tests on Zocor    Medications:  Scheduled:    . acetaminophen  1,000 mg Intravenous Once  . acetaminophen  1,000 mg Intravenous Q6H  .  carvedilol  3.125 mg Oral BID  .  ceFAZolin (ANCEF) IV  1 g Intravenous Q6H  .  ceFAZolin (ANCEF) IV  2 g Intravenous Once  . docusate sodium  100 mg Oral BID  . enoxaparin  30 mg Subcutaneous BID  . famotidine  20 mg Oral BID  . famotidine  20 mg Oral Once  . furosemide  80 mg Oral q morning - 10a  . losartan  25 mg Oral q morning - 10a  . pantoprazole  80 mg Oral Q1200  . pneumococcal 23 valent vaccine  0.5 mL Intramuscular Tomorrow-1000  . pregabalin  75 mg Oral Daily  . simvastatin  20 mg Oral q1800  . DISCONTD: chlorhexidine  60 mL Topical Once  . DISCONTD: dexamethasone  10 mg Intravenous Once   Infusions:    . sodium chloride 75 mL/hr at 10/05/11 1908  . bupivacaine ON-Q pain pump    . DISCONTD: sodium chloride    . DISCONTD: bupivacaine 0.25 % ON-Q pump SINGLE CATH 300 mL    . DISCONTD: lactated ringers 100 mL/hr at 10/05/11 1511  . DISCONTD: lactated ringers     PRN: acetaminophen, acetaminophen, bisacodyl, dicyclomine, diphenhydrAMINE, menthol-cetylpyridinium, methocarbamol(ROBAXIN) IV, methocarbamol, metoCLOPramide (REGLAN) injection, metoCLOPramide, morphine, ondansetron (ZOFRAN) IV, ondansetron, oxyCODONE, phenol, polyethylene glycol, sodium phosphate, temazepam, traMADol, DISCONTD: bupivacaine 0.25 % ON-Q pump SINGLE CATH  300 mL, DISCONTD: HYDROmorphone, DISCONTD: meperidine (DEMEROL) injection DISCONTD: promethazine, DISCONTD: sodium chloride irrigation  Assessment: 78 yo M s/p R TKA to re-start coumadin for VTE prophylaxis s/p TKA and for hx DVT.   Baseline INR wnl Home dose = 4 mg daily except 2 mg on Fridays Given age > 26 and recent surgery will not initiate at 1.5x home dose, rather start with 4 mg tonight.    Goal of Therapy:  INR 2-3   Plan:  1.) Coumadin 4 mg po x 1 tonight at 2000 2.) Daily PT/INR 3.) Coumadin book/video/education  [name] [name], Loma Messing PharmD 7:32 PM 10/05/2011

## 2011-10-06 ENCOUNTER — Other Ambulatory Visit: Payer: Self-pay

## 2011-10-06 LAB — BASIC METABOLIC PANEL
CO2: 27 mEq/L (ref 19–32)
Calcium: 8.4 mg/dL (ref 8.4–10.5)
Chloride: 105 mEq/L (ref 96–112)
Creatinine, Ser: 0.99 mg/dL (ref 0.50–1.35)
Glucose, Bld: 130 mg/dL — ABNORMAL HIGH (ref 70–99)

## 2011-10-06 LAB — CBC
MCH: 32.3 pg (ref 26.0–34.0)
MCHC: 33.8 g/dL (ref 30.0–36.0)
Platelets: 132 10*3/uL — ABNORMAL LOW (ref 150–400)
RDW: 13.2 % (ref 11.5–15.5)

## 2011-10-06 LAB — PREPARE RBC (CROSSMATCH)

## 2011-10-06 MED ORDER — WARFARIN SODIUM 4 MG PO TABS
4.0000 mg | ORAL_TABLET | Freq: Once | ORAL | Status: AC
  Administered 2011-10-06: 4 mg via ORAL
  Filled 2011-10-06: qty 1

## 2011-10-06 MED ORDER — OXYCODONE HCL 5 MG PO TABS
5.0000 mg | ORAL_TABLET | ORAL | Status: DC | PRN
  Administered 2011-10-06 – 2011-10-08 (×5): 5 mg via ORAL
  Filled 2011-10-06 (×6): qty 1

## 2011-10-06 MED ORDER — ESOMEPRAZOLE MAGNESIUM 40 MG PO CPDR
40.0000 mg | DELAYED_RELEASE_CAPSULE | Freq: Every day | ORAL | Status: DC
  Administered 2011-10-07 – 2011-10-09 (×3): 40 mg via ORAL
  Filled 2011-10-06 (×3): qty 1

## 2011-10-06 MED ORDER — POLYSACCHARIDE IRON COMPLEX 150 MG PO CAPS
150.0000 mg | ORAL_CAPSULE | Freq: Every day | ORAL | Status: DC
  Administered 2011-10-06 – 2011-10-09 (×4): 150 mg via ORAL
  Filled 2011-10-06 (×5): qty 1

## 2011-10-06 MED ORDER — FUROSEMIDE 10 MG/ML IJ SOLN
10.0000 mg | Freq: Once | INTRAMUSCULAR | Status: AC
  Administered 2011-10-06: 10 mg via INTRAVENOUS
  Filled 2011-10-06: qty 1

## 2011-10-06 MED ORDER — NON FORMULARY: 40.0000 mg | Freq: Every day | Status: DC

## 2011-10-06 NOTE — Progress Notes (Signed)
Subjective:   [name] [name] is a 78 y.o. male with a history of NICM. He was seen by Dr Shirlee Latch in January and felt to be at acceptable risk for TKR if done without general anesthesia. [name] [name] had the surgery today and cardiology was asked to evaluate him post-op for medical/volume management.  [name] [name] is doing well post-op. Prior to admission, his weight has been stable and he has been as active as his knee would allow. He has had no chest pain. He is compliant with a low-sodium diet and with his meds and has not had any weight gain or LE edema. Post-op he feels well. He is having frequent PVCs and bigeminy at times but is not aware of it. He is not having SOB or chest pain  He feels better this am.  He had an episode of hypotension yesterday after receiving pain meds.  He's better today - up eating breakfast.      . sodium chloride   Intravenous Once  . acetaminophen  1,000 mg Intravenous Once  . acetaminophen  1,000 mg Intravenous Q6H  . carvedilol  3.125 mg Oral BID  .  ceFAZolin (ANCEF) IV  1 g Intravenous Q6H  .  ceFAZolin (ANCEF) IV  2 g Intravenous Once  . coumadin book   Does not apply Once  . docusate sodium  100 mg Oral BID  . enoxaparin  30 mg Subcutaneous BID  . famotidine  20 mg Oral BID  . famotidine  20 mg Oral Once  . furosemide  80 mg Oral q morning - 10a  . losartan  25 mg Oral q morning - 10a  . pantoprazole  80 mg Oral Q1200  . pneumococcal 23 valent vaccine  0.5 mL Intramuscular Tomorrow-1000  . pregabalin  75 mg Oral Daily  . simvastatin  20 mg Oral q1800  . warfarin  4 mg Oral Once  . warfarin   Does not apply Once  . Warfarin - Pharmacist Dosing Inpatient   Does not apply q1800  . DISCONTD: chlorhexidine  60 mL Topical Once  . DISCONTD: dexamethasone  10 mg Intravenous Once      . sodium chloride 100 mL/hr at 10/06/11 0014  . bupivacaine ON-Q pain pump    . DISCONTD: sodium chloride    . DISCONTD: bupivacaine 0.25 % ON-Q pump SINGLE CATH 300 mL     . DISCONTD: lactated ringers 100 mL/hr at 10/05/11 1511  . DISCONTD: lactated ringers      Objective:  Vital Signs in the last 24 hours: Blood pressure 101/51, pulse 67, temperature 98.6 F (37 C), temperature source Oral, resp. rate 20, height 5\' 10"  (1.778 m), weight 191 lb 12.8 oz (87 kg), SpO2 97.00%. Temp:  [94.5 F (34.7 C)-98.6 F (37 C)] 98.6 F (37 C) (03/19 0611) Pulse Rate:  [30-67] 67  (03/19 0611) Resp:  [13-20] 20  (03/19 0611) BP: (84-131)/(42-77) 101/51 mmHg (03/19 0611) SpO2:  [95 %-100 %] 97 % (03/19 0611) Weight:  [191 lb 12.8 oz (87 kg)] 191 lb 12.8 oz (87 kg) (03/18 1703)  Intake/Output from previous day: 03/18 0701 - 03/19 0700 In: 3400.8 [I.V.:3000.8; IV Piggyback:400] Out: 2680 [Urine:1600; Drains:980; Blood:100] Intake/Output from this shift:    Physical Exam:  Physical Exam: Blood pressure 101/51, pulse 67, temperature 98.6 F (37 C), temperature source Oral, resp. rate 20, height 5\' 10"  (1.778 m), weight 191 lb 12.8 oz (87 kg), SpO2 97.00%. General: Well developed, well nourished, in  no acute distress. Head: Normocephalic, atraumatic, sclera non-icteric, mucus membranes are moist,  Neck: Supple. Normal carotids. No JVD Lungs: Clear bilaterally to auscultation without wheezes, rales, or rhonchi. Breathing is unlabored. Heart: Regular rate,  With normal  S1 S2. No murmurs, rubs, or gallops  Abdomen: Soft, non-tender, non-distended with normoactive bowel sounds. No hepatomegaly. No rebound/guarding. No abdominal masses. Msk:  Strength and tone appear normal for age. Extremities: No clubbing or cyanosis. No edema.  Distal pedal pulses are 2+ and equal bilaterally. Neuro: Alert and oriented X 3. Moves all extremities spontaneously. Psych:  Responds to questions appropriately with a normal affect.    Lab Results:   [facility] 10/06/11 0525 10/05/11 1745  NA 136 --  K 4.3 --  CL 105 --  CO2 27 --  GLUCOSE 130* --  BUN 19 --  CREATININE 0.99 --    CALCIUM 8.4 --  MG -- 2.0  PHOS -- --    Basename 10/06/11 0525 10/05/11 2220  WBC 6.0 7.0  NEUTROABS -- --  HGB 9.2* 9.8*  HCT 27.2* 29.3*  MCV 95.4 96.7  PLT 132* 150    Tele:  NSR. Occasional PVCs but much fewer than yesterday.  Assessment/Plan:   1. PVCs:  Much improved.   Continue all of his home meds.   OK to transfer to ortho floor if needed.  I dont think he needs tele at this point.  We will sign off.  Call for questions.   Vesta Mixer, Montez Hageman., MD, [facility] 10/06/2011, 7:41 AM LOS: Day 1

## 2011-10-06 NOTE — Progress Notes (Signed)
CARE MANAGEMENT NOTE 10/06/2011  Patient:  [name] [name], [name] [name]   Account Number:  [id number]  Date Initiated:  10/06/2011  Documentation initiated by:  Colleen Can  Subjective/Objective Assessment:   dx osteoarthritis right knee; total knee replacemnt     Action/Plan:   SNF  rehab   Anticipated DC Date:  10/08/2011   Anticipated DC Plan:  SKILLED NURSING FACILITY  In-house referral  Clinical Social Worker      DC Planning Services  NA      [facility] Choice  NA   Choice offered to / List presented to:  NA   DME arranged  NA      DME agency  NA     HH arranged  NA      HH agency  NA   Status of service:  Completed, signed off   Comments:  10/06/2011 Colleen Can, RN BSN CCM [phone] No HH needs at this time. CSW following patient for SNF rehab, CM signing off.

## 2011-10-06 NOTE — Progress Notes (Signed)
Unable to see pt for am session due to pt receiving first unit of blood.  Will check back in pm for eval.   Thanks,  Clovia Cuff, PT

## 2011-10-06 NOTE — Progress Notes (Signed)
Subjective: 1 Day Post-Op Procedure(s) (LRB): TOTAL KNEE ARTHROPLASTY (Right) Patient reports pain as mild and moderate.   Patient seen in rounds with Dr. Lequita Halt. Patient has complaints of low pressure last night requiring Rapid Response team to evaluate.  He is doing fine this morning.  Dr. Lequita Halt spoke to Dr. Melburn Popper.  May come up to ortho floor.  Will give blood due to his heart history and hypotensive episode. We will start therapy today. Plan is to go [facility] after hospital stay.  Objective: Vital signs in last 24 hours: Temp:  [94.5 F (34.7 C)-98.6 F (37 C)] 98.6 F (37 C) (03/19 1610) Pulse Rate:  [30-67] 67  (03/19 0611) Resp:  [13-20] 20  (03/19 0611) BP: (84-131)/(42-77) 101/51 mmHg (03/19 0611) SpO2:  [95 %-100 %] 97 % (03/19 0611) Weight:  [87 kg (191 lb 12.8 oz)] 87 kg (191 lb 12.8 oz) (03/18 1703)  Intake/Output from previous day:  Intake/Output Summary (Last 24 hours) at 10/06/11 0755 Last data filed at 10/06/11 9604  Gross per 24 hour  Intake 3400.83 ml  Output   2680 ml  Net 720.83 ml    Intake/Output this shift: 350 UOP in computer since MN  Labs:  [facility] 10/06/11 0525 10/05/11 2220  HGB 9.2* 9.8*    Basename 10/06/11 0525 10/05/11 2220  WBC 6.0 7.0  RBC 2.85* 3.03*  HCT 27.2* 29.3*  PLT 132* 150    Basename 10/06/11 0525  NA 136  K 4.3  CL 105  CO2 27  BUN 19  CREATININE 0.99  GLUCOSE 130*  CALCIUM 8.4    Basename 10/06/11 0525 10/05/11 0915  LABPT -- --  INR 1.37 1.26    Exam - Neurovascular intact Sensation intact distally Dressing - clean, dry, no drainage Motor function intact - moving foot and toes well on exam.  Hemovac pulled without difficulty.  Past Medical History  Diagnosis Date  . Coronary atherosclerosis of unspecified type of vessel, native or graft   . Other primary cardiomyopathies   . Other left bundle branch block   . DVT (deep venous thrombosis)     Hx of it. -7 yrs ago  . Other and unspecified  hyperlipidemia   . Type II or unspecified type diabetes mellitus without mention of complication, not stated as uncontrolled   . Esophageal reflux   . Diverticulosis of colon (without mention of hemorrhage)   . Irritable bowel syndrome   . Personal history of malignant neoplasm of prostate   . DJD (degenerative joint disease)   . Spinal stenosis, lumbar region, without neurogenic claudication   . Unspecified hereditary and idiopathic peripheral neuropathy   . Nonischemic cardiomyopathy     EF 35-40%  . Coronary disease     nonobstructive coronary disease at catheterzation 2002.   Marland Kitchen DVT (deep venous thrombosis)     Hx of it on Coumadin therapy  . Sinus bradycardia     requiring discontinuation of Toprol   . Prostate cancer     s/p radiation  . Diabetes mellitus   . Hyperlipidemia     with abnormal liver function tests on Zocor    Assessment/Plan: 1 Day Post-Op Procedure(s) (LRB): TOTAL KNEE ARTHROPLASTY (Right) Principal Problem:  *OA (osteoarthritis) of knee Active Problems:  PVC's (premature ventricular contractions)  Postop Transfusion   Advance diet Up with therapy Continue foley due to blood transfusion; will continue until blood transfusion complete, strict I&O and urinary output monitoring Discharge to SNF  DVT Prophylaxis - Coumadin Protocol  Weight-Bearing as tolerated to right leg Keep foley until tomorrow. No vaccines. D/C O2 and Pulse OX and try on Room [address]  Patrica Duel 10/06/2011, 7:55 AM

## 2011-10-06 NOTE — Progress Notes (Addendum)
ANTICOAGULATION CONSULT NOTE - Follow up Consult  Pharmacy Consult for Warfarin Indication: VTE prophylaxis s/p R TKA, and Hx DVT  No Known Allergies  Patient Measurements: Height: 5\' 10"  (177.8 cm) Weight: 191 lb 12.8 oz (87 kg) IBW/kg (Calculated) : 73    Vital Signs: Temp: 98.3 F (36.8 C) (03/19 1130) Temp src: Oral (03/19 1130) BP: 110/54 mmHg (03/19 1130) Pulse Rate: 65  (03/19 1130)  Labs:  Basename 10/06/11 0525 10/05/11 2220 10/05/11 0915  HGB 9.2* 9.8* --  HCT 27.2* 29.3* --  PLT 132* 150 --  APTT -- -- --  LABPROT 17.1* -- 16.1*  INR 1.37 -- 1.26  HEPARINUNFRC -- -- --  CREATININE 0.99 -- --  CKTOTAL -- -- --  CKMB -- -- --  TROPONINI -- -- --   Estimated Creatinine Clearance: 55.3 ml/min (by C-G formula based on Cr of 0.99).   Medications:  Scheduled:     . sodium chloride   Intravenous Once  . acetaminophen  1,000 mg Intravenous Q6H  . carvedilol  3.125 mg Oral BID  .  ceFAZolin (ANCEF) IV  1 g Intravenous Q6H  .  ceFAZolin (ANCEF) IV  2 g Intravenous Once  . coumadin book   Does not apply Once  . docusate sodium  100 mg Oral BID  . enoxaparin  30 mg Subcutaneous BID  . esomeprazole  40 mg Oral Q breakfast  . famotidine  20 mg Oral BID  . famotidine  20 mg Oral Once  . furosemide  10 mg Intravenous Once  . furosemide  80 mg Oral q morning - 10a  . iron polysaccharides  150 mg Oral Daily  . losartan  25 mg Oral q morning - 10a  . pregabalin  75 mg Oral Daily  . simvastatin  20 mg Oral q1800  . warfarin  4 mg Oral Once  . warfarin   Does not apply Once  . Warfarin - Pharmacist Dosing Inpatient   Does not apply q1800  . DISCONTD: chlorhexidine  60 mL Topical Once  . DISCONTD: dexamethasone  10 mg Intravenous Once  . DISCONTD: NON FORMULARY 40 mg  40 mg Oral QAC breakfast  . DISCONTD: pantoprazole  80 mg Oral Q1200  . DISCONTD: pneumococcal 23 valent vaccine  0.5 mL Intramuscular Tomorrow-1000    Assessment:  78 yo M s/p R TKA to  re-start coumadin for VTE prophylaxis s/p TKA and for hx DVT (warfarin held prior to surgery)  Home dose = 4 mg daily except 2 mg on Fridays  INR subtherapeutic, but increasing with warfarin re-initiation  Lovenox 30mg  SQ Q12 hours ordered per MD  No bleeding reported, scheduled for blood transfusion today  Goal of Therapy:  INR 2-3   Plan:   Coumadin 4 mg PO tonight  Continue Lovenox as ordered, Discontinue when INR >/= 1.8  Daily PT/INR   Lynann Beaver PharmD, BCPS Pager [phone] 10/06/2011 11:55 AM

## 2011-10-06 NOTE — Progress Notes (Signed)
OT Screen Order received, chart reviewed. Pt is currently requiring +2 A with mobility. Plans d/c to st snf. Will sign off and defer OT eval to that venue.  Garrel Ridgel, OTR/L  Pager [phone] 10/06/2011

## 2011-10-06 NOTE — Progress Notes (Signed)
FL2 in shadow chart for MD signature. CSW met with pt/family today to assist with d/c planning. Pt plans to have ST rehab at [facility] following hospitalization. SNF contacted and confirmed plan. [facility] ) will have an opening on Thurs / Fri of this week. CSW wil follow to assist with d/c planning to SNF.  Cori Razor LCSW  [phone]

## 2011-10-06 NOTE — Evaluation (Signed)
Physical Therapy Evaluation Patient Details Name: [name] [name] MRN: [id number] DOB: March 01, 1923 Today's Date: 10/06/2011  Problem List:  Patient Active Problem List  Diagnoses  . DM  . HYPERLIPIDEMIA  . PERIPHERAL NEUROPATHY  . HYPERTENSION, BENIGN  . CORONARY ARTERY DISEASE  . CARDIOMYOPATHY  . LBBB  . SYSTOLIC HEART FAILURE, CHRONIC  . DVT  . GERD  . DIVERTICULOSIS, COLON  . IBS  . DEGENERATIVE JOINT DISEASE  . SPINAL STENOSIS, LUMBAR  . PROSTATE CANCER, HX OF  . Preoperative evaluation to rule out surgical contraindication  . OA (osteoarthritis) of knee  . PVC's (premature ventricular contractions)  . Postop Transfusion    Past Medical History:  Past Medical History  Diagnosis Date  . Coronary atherosclerosis of unspecified type of vessel, native or graft   . Other primary cardiomyopathies   . Other left bundle branch block   . DVT (deep venous thrombosis)     Hx of it. -7 yrs ago  . Other and unspecified hyperlipidemia   . Type II or unspecified type diabetes mellitus without mention of complication, not stated as uncontrolled   . Esophageal reflux   . Diverticulosis of colon (without mention of hemorrhage)   . Irritable bowel syndrome   . Personal history of malignant neoplasm of prostate   . DJD (degenerative joint disease)   . Spinal stenosis, lumbar region, without neurogenic claudication   . Unspecified hereditary and idiopathic peripheral neuropathy   . Nonischemic cardiomyopathy     EF 35-40%  . Coronary disease     nonobstructive coronary disease at catheterzation 2002.   Marland Kitchen DVT (deep venous thrombosis)     Hx of it on Coumadin therapy  . Sinus bradycardia     requiring discontinuation of Toprol   . Prostate cancer     s/p radiation  . Diabetes mellitus   . Hyperlipidemia     with abnormal liver function tests on Zocor   Past Surgical History:  Past Surgical History  Procedure Date  . Appendectomy   . Knee arthroscopy     L knee. 1991 by Dr.  Criss Alvine  . Ercp with sphincterotomy and stone removal     5/93 by Dr. Russella Dar  . Bilat inguinal hernia repair     1998 by Dr. Samuella Cota  . Radium seed implantation for prostate cancer     s/p. 1/00 by Dr. Vonita Moss and Dan Humphreys  . R cataract surgery     2004 by Dr. Elmer Picker  . Repair of right recurrent right inguinal hernia     12/07 by Dr. Lurene Shadow  . Basal cell cancer     L ear. s/p Moh's surgery 3/09 Dr. Alean Rinne  . Total knee arthroplasty     Left. 8/08 by Dr. Charlann Boxer  . Tonsillectomy 09-28-11    child  . Cholecystectomy     1985 by Dr. Julien Girt    PT Assessment/Plan/Recommendation PT Assessment Clinical Impression Statement: Pt presents s/p R TKA POD 1 with history of heart disease/cardiomyopathy with decreased strength, ROM, and mobility.  Pt tolerated some ambulation, however, stated "I feel sick."  Returned pt to recliner with BP of 97/51, approx 2 mins later 103/46, approx 5 mins later 106/50 with cues for pt to perform ankle pumps and with pt reclined back in chair.  RN made aware.  Pt will benefit from skilled PTin acute venue to address deficits.  PT recommends SNF for follow up therapy in order to increase pt safety and decrease burden of care.  PT Recommendation/Assessment: Patient will need skilled PT in the acute care venue PT Problem List: Decreased strength;Decreased range of motion;Decreased activity tolerance;Decreased balance;Decreased mobility;Decreased coordination;Decreased knowledge of use of DME;Pain Barriers to Discharge: Decreased caregiver support PT Therapy Diagnosis : Difficulty walking;Abnormality of gait;Generalized weakness;Acute pain PT Plan PT Frequency: Min 3X/week PT Treatment/Interventions: DME instruction;Gait training;Functional mobility training;Therapeutic activities;Therapeutic exercise;Patient/family education PT Recommendation Follow Up Recommendations: Skilled nursing facility Equipment Recommended: Defer to next venue PT Goals  Acute Rehab PT  Goals PT Goal Formulation: With patient Time For Goal Achievement: 7 days Pt will go Supine/Side to Sit: with supervision PT Goal: Supine/Side to Sit - Progress: Goal set today Pt will go Sit to Supine/Side: with supervision PT Goal: Sit to Supine/Side - Progress: Goal set today Pt will go Sit to Stand: with supervision PT Goal: Sit to Stand - Progress: Goal set today Pt will go Stand to Sit: with supervision PT Goal: Stand to Sit - Progress: Goal set today Pt will Ambulate: 51 - 150 feet;with supervision;with least restrictive assistive device PT Goal: Ambulate - Progress: Goal set today Pt will Perform Home Exercise Program: with supervision, verbal cues required/provided PT Goal: Perform Home Exercise Program - Progress: Goal set today  PT Evaluation Precautions/Restrictions  Precautions Precautions: Knee Required Braces or Orthoses: Yes Knee Immobilizer: Discontinue once straight leg raise with < 10 degree lag Restrictions Weight Bearing Restrictions: Yes RLE Weight Bearing: Weight bearing as tolerated Prior Functioning  Home Living Lives With: Spouse Receives Help From: Family Type of Home: House Home Layout: One level Home Access: Level entry Home Adaptive Equipment: Walker - rolling Prior Function Level of Independence: Independent with basic ADLs;Independent with gait;Independent with transfers Driving: Yes Vocation: Retired Producer, television/film/video: Awake/alert Overall Cognitive Status: Appears within functional limits for tasks assessed Orientation Level: Oriented X4 Sensation/Coordination Sensation Light Touch: Appears Intact Coordination Gross Motor Movements are Fluid and Coordinated: Yes Extremity Assessment RLE Assessment RLE Assessment: Exceptions to [facility] RLE Strength RLE Overall Strength Comments: ankle DF/PF WFL, unable to perform SLR without assist.  LLE Assessment LLE Assessment: Exceptions to [facility] LLE Strength LLE Overall Strength  Comments: Grossly WFL per functional assessment.  Mobility (including Balance) Bed Mobility Bed Mobility: Yes Supine to Sit: 1: +2 Total assist;Patient percentage (comment);With rails Supine to Sit Details (indicate cue type and reason): Pt assist 40%.  Requires assist for RLE off of bed and for trunk to attain sitting position.  Cues provided for UE placement on bed to self assist.  Transfers Transfers: Yes Sit to Stand: 1: +2 Total assist;Patient percentage (comment);From elevated surface;With upper extremity assist;From bed Sit to Stand Details (indicate cue type and reason): Pt assist 60%.  Assist for forward weight shift with cues for hand placement on bed.  Stand to Sit: 1: +2 Total assist;Patient percentage (comment);With upper extremity assist;With armrests;To chair/3-in-1 Stand to Sit Details: Pt assist 70%.  Some assist for controlled descent with cues for UE placement.  Ambulation/Gait Ambulation/Gait: Yes Ambulation/Gait Assistance: 1: +2 Total assist;Patient percentage (comment) Ambulation/Gait Assistance Details (indicate cue type and reason): Pt assist 65%.  Requires manual and verbal cuing for sequencing/technique with RW and for upright posture.   Ambulation Distance (Feet): 25 Feet Assistive device: Rolling walker Gait Pattern: Step-to pattern;Decreased stance time - right;Decreased step length - left;Trunk flexed Gait velocity: decreased Stairs: No Wheelchair Mobility Wheelchair Mobility: No    Exercise    End of Session PT - End of Session Equipment Utilized During Treatment: Gait belt;Right knee immobilizer Activity Tolerance:  Patient limited by pain;Other (comment) (Pt limited by decrease in BP) Patient left: in chair;with call bell in reach Nurse Communication: Mobility status for transfers;Mobility status for ambulation General Behavior During Session: [facility] for tasks performed Cognition: [facility] for tasks performed  Page, Meribeth Mattes 10/06/2011, 3:02 PM

## 2011-10-07 LAB — TYPE AND SCREEN
Antibody Screen: NEGATIVE
Unit division: 0

## 2011-10-07 LAB — BASIC METABOLIC PANEL
BUN: 19 mg/dL (ref 6–23)
Chloride: 101 mEq/L (ref 96–112)
Creatinine, Ser: 1.02 mg/dL (ref 0.50–1.35)
GFR calc Af Amer: 75 mL/min — ABNORMAL LOW (ref >90)
Glucose, Bld: 175 mg/dL — ABNORMAL HIGH (ref 70–99)
Potassium: 3.7 mEq/L (ref 3.5–5.1)

## 2011-10-07 LAB — CBC
HCT: 31 % — ABNORMAL LOW (ref 39.0–52.0)
Hemoglobin: 10.6 g/dL — ABNORMAL LOW (ref 13.0–17.0)
MCHC: 34.2 g/dL (ref 30.0–36.0)
MCV: 92 fL (ref 78.0–100.0)
RDW: 14.5 % (ref 11.5–15.5)
WBC: 8.6 10*3/uL (ref 4.0–10.5)

## 2011-10-07 MED ORDER — BISACODYL 10 MG RE SUPP
10.0000 mg | Freq: Every day | RECTAL | Status: DC | PRN
  Administered 2011-10-08: 10 mg via RECTAL
  Filled 2011-10-07: qty 1

## 2011-10-07 MED ORDER — SODIUM CHLORIDE 0.9 % IV SOLN: INTRAVENOUS | Status: AC

## 2011-10-07 MED ORDER — SODIUM CHLORIDE 0.9 % IV SOLN
INTRAVENOUS | Status: DC
  Administered 2011-10-08: 06:00:00 via INTRAVENOUS

## 2011-10-07 MED ORDER — WARFARIN SODIUM 4 MG PO TABS
4.0000 mg | ORAL_TABLET | Freq: Once | ORAL | Status: AC
  Administered 2011-10-07: 4 mg via ORAL
  Filled 2011-10-07 (×2): qty 1

## 2011-10-07 MED ORDER — SODIUM CHLORIDE 0.9 % IV BOLUS (SEPSIS)
250.0000 mL | Freq: Once | INTRAVENOUS | Status: AC
  Administered 2011-10-07: 250 mL via INTRAVENOUS

## 2011-10-07 MED ORDER — POLYETHYLENE GLYCOL 3350 17 G PO PACK
17.0000 g | PACK | Freq: Every day | ORAL | Status: DC | PRN
  Filled 2011-10-07: qty 1

## 2011-10-07 NOTE — Progress Notes (Signed)
CSW is assisting with d/c planning to SNF. [facility] has SNF bed available Thurs. If pt is stable for d/c. CSW will assist with d/c planning to SNF.  Cori Razor  LCSW  [phone]

## 2011-10-07 NOTE — Clinical Documentation Improvement (Signed)
     Anemia Blood Loss Clarification  THIS DOCUMENT IS NOT A PERMANENT PART OF THE MEDICAL RECORD  RESPOND TO THE THIS QUERY, FOLLOW THE INSTRUCTIONS BELOW:  1. If needed, update documentation for the patient's encounter via the notes activity.  2. Access this query again and click edit on the In [employer].  3. After updating, or not, click F2 to complete all highlighted (required) fields concerning your review. Select "additional documentation in the medical record" OR "no additional documentation provided".  4. Click Sign note button.  5. The deficiency will fall out of your In Basket *Please let us know if you are not able to complete this workflow by phone or e-mail (listed below).        10/07/11  Dear Stormy Fabian, F/Associates  In an effort to better capture your patient's severity of illness, reflect appropriate length of stay and utilization of resources, a review of the patient medical record has revealed the following indicators.    Based on your clinical judgment, please clarify and document in a progress note and/or discharge summary the clinical condition associated with the following supporting information:  In responding to this query please exercise your independent judgment.  The fact that a query is asked, does not imply that any particular answer is desired or expected.  Pt admitted for R TKA.  According to pt record  On 3/20/13pt received a post op transfusion in setting of "low pressures" s/p R TKA   Pt clarify if post op transfusion was due to one of the diagnosis listed below and document in pn and d/c summary.  Thank you for all that you do for our patients!   Possible Clinical Conditions?   " Expected Acute Blood Loss Anemia  " Acute Blood Loss Anemia  " Acute on chronic blood loss anemia  " Other Condition________________  " Cannot Clinically Determine  Risk Factors: (recent surgery, pre op anemia, EBL in OR)  Supporting Information: OA, R  TKA  Signs and Symptoms  PN 3/191/3  Continue foley due to blood transfusion; will continue until blood transfusion complete, strict I&O and urinary output  Patient has complaints of low pressure last night requiring Rapid Response team to evaluate.  He is doing fine this morning.  Dr. Lequita Halt spoke to Dr. Melburn Popper.  May come up to ortho floor.  Will give blood due to his heart history and hypotensive episode.  Diagnostics: Component     Latest Ref Rng 10/05/2011 10/06/2011 10/07/2011  HGB     13.0 - 17.0 g/dL 9.8 (L) 9.2 (L) 16.1 (L)  HCT     39.0 - 52.0 % 29.3 (L) 27.2 (L) 31.0 (L)    Treatments: Transfusion: PN 10/07/11 Postop Transfusion  IV fluids / plasma expanders: 0.9 % sodium chloride infusion     Serial H&H monitoring Medications  iron polysaccharides (NIFEREX  Reviewed: additional documentation in the medical record  Thank You,  Enis Slipper  RN, BSN, CCDS Clinical Documentation Specialist [facility] HIM Dept Pager: [phone] / E-mail: [email or web address]  Health Information Management [place]

## 2011-10-07 NOTE — Progress Notes (Signed)
Physical Therapy Treatment Patient Details Name: [name] [name] MRN: [id number] DOB: July 15, 1923 Today's Date: 10/07/2011  PT Assessment/Plan  PT - Assessment/Plan Comments on Treatment Session: Pt continues to have c/o dizziness with ambulation.  BP 103/51 following ambulation and 105/55 once sitting in chair approx 3 miins. RN made aware.  Continue to recommend SNF for follow up therapy at D/C.   PT Plan: Discharge plan remains appropriate PT Frequency: Min 3X/week Follow Up Recommendations: Skilled nursing facility Equipment Recommended: Defer to next venue PT Goals  Acute Rehab PT Goals PT Goal Formulation: With patient Time For Goal Achievement: 7 days Pt will go Supine/Side to Sit: with supervision PT Goal: Supine/Side to Sit - Progress: Progressing toward goal Pt will go Sit to Stand: with supervision PT Goal: Sit to Stand - Progress: Progressing toward goal Pt will go Stand to Sit: with supervision PT Goal: Stand to Sit - Progress: Progressing toward goal Pt will Ambulate: 51 - 150 feet;with supervision;with least restrictive assistive device PT Goal: Ambulate - Progress: Progressing toward goal  PT Treatment Precautions/Restrictions  Precautions Precautions: Knee Required Braces or Orthoses: Yes Knee Immobilizer: Discontinue once straight leg raise with < 10 degree lag Restrictions Weight Bearing Restrictions: Yes RLE Weight Bearing: Weight bearing as tolerated Mobility (including Balance) Bed Mobility Bed Mobility: Yes Supine to Sit: 4: Min assist Supine to Sit Details (indicate cue type and reason): Min assist for R LE off of bed with cuing for hand placement.  Noted pt with better technique during today's session.  Transfers Transfers: Yes Sit to Stand: 1: +2 Total assist;Patient percentage (comment);From elevated surface;With upper extremity assist;From bed Sit to Stand Details (indicate cue type and reason): Pt assist 65%.  Assist for forward weight shfit with  manual and verbal cuing for hand placement on bed.  Stand to Sit: 1: +2 Total assist;Patient percentage (comment);With upper extremity assist;With armrests;To chair/3-in-1 Stand to Sit Details: Pt assist 70%.  Assist for controlled descent due to pt stating he felt dizzy.  Cues for UE placement and RLE management.  Ambulation/Gait Ambulation/Gait: Yes Ambulation/Gait Assistance: 1: +2 Total assist;Patient percentage (comment) Ambulation/Gait Assistance Details (indicate cue type and reason): Pt assist 65%.  Continues to require some manual and verbal cues for RW sequencing/technique.   Ambulation Distance (Feet): 25 Feet Assistive device: Rolling walker Gait Pattern: Step-to pattern;Decreased stance time - right;Decreased step length - left;Trunk flexed Gait velocity: decreased Stairs: No Wheelchair Mobility Wheelchair Mobility: No    Exercise    End of Session PT - End of Session Equipment Utilized During Treatment: Gait belt;Right knee immobilizer Activity Tolerance: Patient limited by pain;Other (comment) (Limited by dizziness) Patient left: in chair;with call bell in reach Nurse Communication: Mobility status for transfers;Mobility status for ambulation General Behavior During Session: [facility] for tasks performed Cognition: [facility] for tasks performed  Page, Meribeth Mattes 10/07/2011, 12:44 PM

## 2011-10-07 NOTE — Progress Notes (Signed)
Subjective: 2 Days Post-Op Procedure(s) (LRB): TOTAL KNEE ARTHROPLASTY (Right) Patient reports pain as mild.   Patient has complaints of some soreness on movement but otherwise doing fairly well.  Some sleep.  Taking and tolerating the pain pills.  Passing flatus but no movement yet.  Did some walking yesterday.  Will get up with PT again this morning.  Foley out today.  Plan is to go to [facility] ) after the hospital stay.  Possibly tomorrow to SNF if he is doing well.  Denies any complaints of SOB/CP/Nausea/Vomiting/Palpitations/Heart racing.  Objective: Vital signs in last 24 hours: Temp:  [97.9 F (36.6 C)-98.9 F (37.2 C)] 97.9 F (36.6 C) (03/20 0554) Pulse Rate:  [65-94] 80  (03/20 0922) Resp:  [18-20] 19  (03/20 0554) BP: (104-122)/(54-67) 116/64 mmHg (03/20 0922) SpO2:  [92 %-96 %] 92 % (03/20 0554)  Intake/Output from previous day:  Intake/Output Summary (Last 24 hours) at 10/07/11 1029 Last data filed at 10/07/11 0700  Gross per 24 hour  Intake 2167.5 ml  Output   4600 ml  Net -2432.5 ml    Intake/Output this shift: 600 UOP   Labs:  [facility] 10/07/11 0455 10/06/11 0525 10/05/11 2220  HGB 10.6* 9.2* 9.8*    Basename 10/07/11 0455 10/06/11 0525  WBC 8.6 6.0  RBC 3.37* 2.85*  HCT 31.0* 27.2*  PLT 135* 132*    Basename 10/07/11 0455 10/06/11 0525  NA 135 136  K 3.7 4.3  CL 101 105  CO2 28 27  BUN 19 19  CREATININE 1.02 0.99  GLUCOSE 175* 130*  CALCIUM 9.0 8.4    Basename 10/07/11 0455 10/06/11 0525  LABPT -- --  INR 1.65* 1.37    Exam - Neurovascular intact Sensation intact distally Dressing/Incision - clean, dry, no drainage, small superficial abrasion distal to the incision (was present before surgery), covered with Tegaderm. Motor function intact - moving foot and toes well on exam.   Past Medical History  Diagnosis Date  . Coronary atherosclerosis of unspecified type of vessel, native or graft   . Other primary cardiomyopathies     . Other left bundle branch block   . DVT (deep venous thrombosis)     Hx of it. -7 yrs ago  . Other and unspecified hyperlipidemia   . Type II or unspecified type diabetes mellitus without mention of complication, not stated as uncontrolled   . Esophageal reflux   . Diverticulosis of colon (without mention of hemorrhage)   . Irritable bowel syndrome   . Personal history of malignant neoplasm of prostate   . DJD (degenerative joint disease)   . Spinal stenosis, lumbar region, without neurogenic claudication   . Unspecified hereditary and idiopathic peripheral neuropathy   . Nonischemic cardiomyopathy     EF 35-40%  . Coronary disease     nonobstructive coronary disease at catheterzation 2002.   Marland Kitchen DVT (deep venous thrombosis)     Hx of it on Coumadin therapy  . Sinus bradycardia     requiring discontinuation of Toprol   . Prostate cancer     s/p radiation  . Diabetes mellitus   . Hyperlipidemia     with abnormal liver function tests on Zocor    Assessment/Plan: 2 Days Post-Op Procedure(s) (LRB): TOTAL KNEE ARTHROPLASTY (Right) Principal Problem:  *OA (osteoarthritis) of knee Active Problems:  PVC's (premature ventricular contractions)  Postop Transfusion   Advance diet Up with therapy Plan for discharge tomorrow Discharge to SNF if doing well and bed  available.  DVT Prophylaxis - Coumadin Protocol - INR 1.65 today, Lovenox [place] Weight-Bearing as tolerated to right leg DC Foley  [name] [name] Pager [phone]  10/07/2011, 10:29 AM

## 2011-10-07 NOTE — Progress Notes (Signed)
ANTICOAGULATION CONSULT NOTE - Follow up Consult  Pharmacy Consult for Warfarin Indication: VTE prophylaxis s/p R TKA, and Hx DVT  No Known Allergies  Patient Measurements: Height: 5\' 10"  (177.8 cm) Weight: 191 lb 12.8 oz (87 kg) IBW/kg (Calculated) : 73    Vital Signs: Temp: 98.7 F (37.1 C) (03/20 1513) Temp src: Oral (03/20 1513) BP: 106/63 mmHg (03/20 1513) Pulse Rate: 71  (03/20 1513)  Labs:  Basename 10/07/11 0455 10/06/11 0525 10/05/11 2220 10/05/11 0915  HGB 10.6* 9.2* -- --  HCT 31.0* 27.2* 29.3* --  PLT 135* 132* 150 --  APTT -- -- -- --  LABPROT 19.8* 17.1* -- 16.1*  INR 1.65* 1.37 -- 1.26  HEPARINUNFRC -- -- -- --  CREATININE 1.02 0.99 -- --  CKTOTAL -- -- -- --  CKMB -- -- -- --  TROPONINI -- -- -- --   Estimated Creatinine Clearance: 53.7 ml/min (by C-G formula based on Cr of 1.02).   Medications:  Scheduled:     . carvedilol  3.125 mg Oral BID  . docusate sodium  100 mg Oral BID  . enoxaparin  30 mg Subcutaneous BID  . esomeprazole  40 mg Oral Q breakfast  . famotidine  20 mg Oral BID  . furosemide  80 mg Oral q morning - 10a  . iron polysaccharides  150 mg Oral Daily  . losartan  25 mg Oral q morning - 10a  . pregabalin  75 mg Oral Daily  . simvastatin  20 mg Oral q1800  . sodium chloride  250 mL Intravenous Once  . warfarin  4 mg Oral ONCE-1800  . Warfarin - Pharmacist Dosing Inpatient   Does not apply q1800    Assessment:  78 yo M s/p R TKA to re-start coumadin for VTE prophylaxis s/p TKA and for hx DVT (warfarin held prior to surgery)  Home dose = 4 mg daily except 2 mg on Fridays  INR subtherapeutic, but increasing with warfarin re-initiation  Lovenox 30mg  SQ Q12 hours ordered per MD  No bleeding reported in progress notes  Goal of Therapy:  INR 2-3   Plan:   Coumadin 4 mg PO tonight  Continue Lovenox as ordered, Discontinue when INR >/= 1.8  Daily PT/INR   Darrol Angel, PharmD Pager: [phone] 10/07/2011 3:15  PM

## 2011-10-07 NOTE — Progress Notes (Signed)
Patient worked with physical therapy this afternoon. After walking a few steps patient reported to PT that he was dizzy and had some nausea.  BP was checked. bp was 88/43 and hr was 62.  Patient was taken back to bed and bp was rechecked. bp was 91/46 and hr 62. Patient is alert and oriented. PA was notified of situation. Plans are to give a 250 cc NS bolus, then 125 cc NS for two hours. Bp is now 104/40, will recheck after bolus and notify PA. Will continue to monitor patient.

## 2011-10-07 NOTE — Progress Notes (Signed)
Physical Therapy Treatment Patient Details Name: NIEL PERETTI MRN: [id number] DOB: 26-Nov-1922 Today's Date: 10/07/2011  PT Assessment/Plan  PT - Assessment/Plan Comments on Treatment Session: Pt continues to have decreased BP with ambulation.  BP read 88/43 following ambulation and returning to bed.  Placed pt in Trendelenburg position in bed to allow BP to stabalize.  RN took BP and read 104/40. Maintained Trendelenburg for approx 8 mins and BP stabalized.  RN notified PA, to order fluid.   PT Plan: Discharge plan remains appropriate PT Frequency: Min 3X/week Follow Up Recommendations: Skilled nursing facility Equipment Recommended: Defer to next venue PT Goals  Acute Rehab PT Goals PT Goal Formulation: With patient Time For Goal Achievement: 7 days Pt will go Supine/Side to Sit: with supervision PT Goal: Supine/Side to Sit - Progress: Progressing toward goal Pt will go Sit to Supine/Side: with supervision PT Goal: Sit to Supine/Side - Progress: Progressing toward goal Pt will go Sit to Stand: with supervision PT Goal: Sit to Stand - Progress: Progressing toward goal Pt will go Stand to Sit: with supervision PT Goal: Stand to Sit - Progress: Progressing toward goal Pt will Ambulate: 51 - 150 feet;with supervision;with least restrictive assistive device PT Goal: Ambulate - Progress: Progressing toward goal  PT Treatment Precautions/Restrictions  Precautions Precautions: Knee Required Braces or Orthoses: Yes Knee Immobilizer: Discontinue once straight leg raise with < 10 degree lag Restrictions Weight Bearing Restrictions: Yes RLE Weight Bearing: Weight bearing as tolerated Mobility (including Balance) Bed Mobility Bed Mobility: Yes Supine to Sit: 4: Min assist Supine to Sit Details (indicate cue type and reason): Min assist for R LE off of bed with cuing for hand placement.  Noted pt with better technique during today's session.  Sit to Supine: 1: +2 Total assist;Patient  percentage (comment);HOB flat Sit to Supine - Details (indicate cue type and reason): Pt assist 50%.  Assist for B LE and trunk due to decreased BP during ambulation/transfer.  Cues for technique.  Transfers Transfers: Yes Sit to Stand: 1: +2 Total assist;Patient percentage (comment);With upper extremity assist;With armrests;From chair/3-in-1 Sit to Stand Details (indicate cue type and reason): Pt assist 50% from recliner due to lower surface.  Pt unable to flex L knee, therefore therapist provided support to L LE to prevent foot from sliding out.  Cues for hand placement and technique.  Stand to Sit: 1: +2 Total assist;Patient percentage (comment);With upper extremity assist;To chair/3-in-1;With armrests Stand to Sit Details: Pt assist 70%.  Assist for controlled descent due to increased dizziness/nausea with ambulation.  cues for hand placement and RLE management.  Ambulation/Gait Ambulation/Gait: Yes Ambulation/Gait Assistance: 1: +2 Total assist;Patient percentage (comment) Ambulation/Gait Assistance Details (indicate cue type and reason): Pt assist 65%. Continues to require some manual and verbal cues for RW sequencing/technique.  Ambulation Distance (Feet): 20 Feet (then another 5' from chair to bed) Assistive device: Rolling walker Gait Pattern: Step-to pattern;Decreased stance time - right;Decreased step length - left;Trunk flexed Gait velocity: decreased Stairs: No Wheelchair Mobility Wheelchair Mobility: No    Exercise    End of Session PT - End of Session Equipment Utilized During Treatment: Gait belt;Right knee immobilizer Activity Tolerance: Patient limited by pain;Other (comment) (Continues to have BP issues) Patient left: in bed;with call bell in reach Nurse Communication: Other (comment) (RN aware of BP) General Behavior During Session: [facility] for tasks performed Cognition: [facility] for tasks performed  Page, Meribeth Mattes 10/07/2011, 4:15 PM

## 2011-10-08 LAB — CBC
HCT: 28.7 % — ABNORMAL LOW (ref 39.0–52.0)
MCH: 31.3 pg (ref 26.0–34.0)
MCHC: 33.8 g/dL (ref 30.0–36.0)
RDW: 14.5 % (ref 11.5–15.5)

## 2011-10-08 LAB — BASIC METABOLIC PANEL
BUN: 23 mg/dL (ref 6–23)
CO2: 28 mEq/L (ref 19–32)
Calcium: 8.6 mg/dL (ref 8.4–10.5)
Chloride: 97 mEq/L (ref 96–112)
Creatinine, Ser: 1.16 mg/dL (ref 0.50–1.35)

## 2011-10-08 LAB — PROTIME-INR
INR: 1.88 — ABNORMAL HIGH (ref 0.00–1.49)
Prothrombin Time: 21.9 seconds — ABNORMAL HIGH (ref 11.6–15.2)

## 2011-10-08 MED ORDER — WARFARIN SODIUM 4 MG PO TABS
4.0000 mg | ORAL_TABLET | Freq: Once | ORAL | Status: AC
  Administered 2011-10-08: 4 mg via ORAL
  Filled 2011-10-08: qty 1

## 2011-10-08 MED ORDER — SODIUM CHLORIDE 0.9 % IV BOLUS (SEPSIS)
500.0000 mL | Freq: Once | INTRAVENOUS | Status: AC
  Administered 2011-10-08: 500 mL via INTRAVENOUS

## 2011-10-08 MED ORDER — FUROSEMIDE 20 MG PO TABS
20.0000 mg | ORAL_TABLET | Freq: Every morning | ORAL | Status: DC
  Administered 2011-10-08 – 2011-10-09 (×2): 20 mg via ORAL
  Filled 2011-10-08 (×2): qty 1

## 2011-10-08 NOTE — Progress Notes (Signed)
Subjective: 3 Days Post-Op Procedure(s) (LRB): TOTAL KNEE ARTHROPLASTY (Right) Patient reports pain as mild and moderate.   Patient seen in rounds with Dr. Lequita Halt. Patient has complaints of low pressure last night and yesterday.  Receiving fluids.  Will watch for one more day and possibly transfer over to [employer] at [facility].  Only got to walk short distance in the room yesterday.  Some elevated temp last night also.  Needs to work on I.S. Recheck labs in AM.    Objective: Vital signs in last 24 hours: Temp:  [98.7 F (37.1 C)-102.5 F (39.2 C)] 100.1 F (37.8 C) (03/21 0458) Pulse Rate:  [71-91] 83  (03/21 0458) Resp:  [16-18] 16  (03/21 0458) BP: (98-123)/(48-64) 98/48 mmHg (03/21 0458) SpO2:  [90 %-94 %] 94 % (03/21 0458) Weight:  [87.181 kg (192 lb 3.2 oz)] 87.181 kg (192 lb 3.2 oz) (03/21 0458)  Intake/Output from previous day:  Intake/Output Summary (Last 24 hours) at 10/08/11 0819 Last data filed at 10/08/11 0600  Gross per 24 hour  Intake   1470 ml  Output   2175 ml  Net   -705 ml    Intake/Output this shift: 350 UOP  Labs:  Basename 10/08/11 0505 10/07/11 0455 10/06/11 0525 10/05/11 2220  HGB 9.7* 10.6* 9.2* 9.8*    Basename 10/08/11 0505 10/07/11 0455  WBC 9.2 8.6  RBC 3.10* 3.37*  HCT 28.7* 31.0*  PLT 132* 135*    Basename 10/08/11 0505 10/07/11 0455  NA 133* 135  K 3.5 3.7  CL 97 101  CO2 28 28  BUN 23 19  CREATININE 1.16 1.02  GLUCOSE 138* 175*  CALCIUM 8.6 9.0    Basename 10/08/11 0505 10/07/11 0455  LABPT -- --  INR 1.88* 1.65*    Exam - Neurovascular intact Sensation intact distally Dressing/Incision - clean, dry, no drainage Motor function intact - moving foot and toes well on exam.   Past Medical History  Diagnosis Date  . Coronary atherosclerosis of unspecified type of vessel, native or graft   . Other primary cardiomyopathies   . Other left bundle branch block   . DVT (deep venous thrombosis)     Hx of it. -7  yrs ago  . Other and unspecified hyperlipidemia   . Type II or unspecified type diabetes mellitus without mention of complication, not stated as uncontrolled   . Esophageal reflux   . Diverticulosis of colon (without mention of hemorrhage)   . Irritable bowel syndrome   . Personal history of malignant neoplasm of prostate   . DJD (degenerative joint disease)   . Spinal stenosis, lumbar region, without neurogenic claudication   . Unspecified hereditary and idiopathic peripheral neuropathy   . Nonischemic cardiomyopathy     EF 35-40%  . Coronary disease     nonobstructive coronary disease at catheterzation 2002.   Marland Kitchen DVT (deep venous thrombosis)     Hx of it on Coumadin therapy  . Sinus bradycardia     requiring discontinuation of Toprol   . Prostate cancer     s/p radiation  . Diabetes mellitus   . Hyperlipidemia     with abnormal liver function tests on Zocor    Assessment/Plan: 3 Days Post-Op Procedure(s) (LRB): TOTAL KNEE ARTHROPLASTY (Right) Principal Problem:  *OA (osteoarthritis) of knee Active Problems:  PVC's (premature ventricular contractions)  Postop Transfusion  Postop Hypotension  Postop Hyponatremia  Postoperative symptomatic anemia  Patient is negative on fluids at this time.  Will  give additional fluids this morning and monitor pressure. Reduce Lasix today. Up with therapy Plan for discharge tomorrow Discharge to SNF - Plan for [facility] for tomorrow.  DVT Prophylaxis - Coumadin Protocol - INR is up to 1.88 today. Weight-Bearing as tolerated to right leg  Patrica Duel Pager [phone]  10/08/2011, 8:19 AM

## 2011-10-08 NOTE — Progress Notes (Signed)
Physical Therapy Treatment Patient Details Name: [name] [name] MRN: [id number] DOB: [birth year] Today's Date: 10/08/2011  PT Assessment/Plan  PT - Assessment/Plan Comments on Treatment Session: Pt continues to have c/o dizziness/nausea with ambulation with BP dropping from 106/56 at rest to 88/48 after ambulation and 96/56 once sitting x 3 mins.  RN aware of BP issues.   PT Plan: Discharge plan remains appropriate PT Frequency: Min 3X/week Follow Up Recommendations: Skilled nursing facility Equipment Recommended: Defer to next venue PT Goals  Acute Rehab PT Goals PT Goal Formulation: With patient Time For Goal Achievement: 7 days Pt will go Sit to Stand: with supervision PT Goal: Sit to Stand - Progress: Progressing toward goal Pt will go Stand to Sit: with supervision PT Goal: Stand to Sit - Progress: Progressing toward goal Pt will Ambulate: 51 - 150 feet;with supervision;with least restrictive assistive device PT Goal: Ambulate - Progress: Not progressing (due to BP issues) Pt will Perform Home Exercise Program: with supervision, verbal cues required/provided PT Goal: Perform Home Exercise Program - Progress: Progressing toward goal  PT Treatment Precautions/Restrictions  Precautions Precautions: Knee Required Braces or Orthoses: Yes Knee Immobilizer: Discontinue once straight leg raise with < 10 degree lag Restrictions Weight Bearing Restrictions: Yes RLE Weight Bearing: Weight bearing as tolerated Mobility (including Balance) Bed Mobility Bed Mobility: No (Pt was sitting up in recliner. ) Transfers Transfers: Yes Sit to Stand: 1: +2 Total assist;Patient percentage (comment);With upper extremity assist;With armrests;From chair/3-in-1 Sit to Stand Details (indicate cue type and reason): Pt assist 55% from recliner due to lower surface.  Continue to provide support to LLE with cues for hand placement and LE management once in standing.  Stand to Sit: 1: +2 Total  assist;Patient percentage (comment);With upper extremity assist;With armrests;To chair/3-in-1 Stand to Sit Details: Pt assist 70%.  Assist for controlled descent due to continued c/o dizziness/nausea with ambulation.  Cues for hand placement and LE managment.  Ambulation/Gait Ambulation/Gait: Yes Ambulation/Gait Assistance: 1: +2 Total assist;Patient percentage (comment) Ambulation/Gait Assistance Details (indicate cue type and reason): Pt assist 65% with continued manual and verbal cuing for sequencing/technqiue with RW initially, with pt demonstrating better technique at end of ambulation.  Ambulation Distance (Feet): 20 Feet Assistive device: Rolling walker Gait Pattern: Step-to pattern;Decreased stance time - right;Decreased step length - left;Trunk flexed Gait velocity: decreased    Exercise  Total Joint Exercises Ankle Circles/Pumps: AROM;Both;20 reps;Seated Heel Slides: AAROM;Right;10 reps;Seated Hip ABduction/ADduction: AAROM;Right;10 reps;Seated Straight Leg Raises: AAROM;Right;10 reps;Seated End of Session PT - End of Session Equipment Utilized During Treatment: Gait belt;Right knee immobilizer Activity Tolerance: Other (comment);Patient limited by pain (Continues to be limited by low BP) Patient left: in chair;with call bell in reach Nurse Communication: Other (comment) (RN aware of BP) General Behavior During Session: [facility] for tasks performed Cognition: [facility] for tasks performed  Page, Meribeth Mattes 10/08/2011, 10:58 AM

## 2011-10-08 NOTE — Progress Notes (Signed)
CSW assisting with D/C planning. [facility] contacted and will have a ST SNF bed available Fri for this pt if ready for d/c. CSW will follow to assist with d/c planning to SNF.  Cori Razor  LCSW  [phone]

## 2011-10-08 NOTE — Progress Notes (Signed)
ANTICOAGULATION CONSULT NOTE - Follow up Consult  Pharmacy Consult for Warfarin Indication: VTE prophylaxis s/p R TKA, and Hx DVT  No Known Allergies  Patient Measurements: Height: 5\' 10"  (177.8 cm) Weight: 192 lb 3.2 oz (87.181 kg) IBW/kg (Calculated) : 73    Vital Signs: Temp: 100.1 F (37.8 C) (03/21 0458) Temp src: Oral (03/21 0458) BP: 103/56 mmHg (03/21 1342) Pulse Rate: 94  (03/21 1342)  Labs:  Basename 10/08/11 0505 10/07/11 0455 10/06/11 0525  HGB 9.7* 10.6* --  HCT 28.7* 31.0* 27.2*  PLT 132* 135* 132*  APTT -- -- --  LABPROT 21.9* 19.8* 17.1*  INR 1.88* 1.65* 1.37  HEPARINUNFRC -- -- --  CREATININE 1.16 1.02 0.99  CKTOTAL -- -- --  CKMB -- -- --  TROPONINI -- -- --   Estimated Creatinine Clearance: 47.2 ml/min (by C-G formula based on Cr of 1.16).   Medications:  Scheduled:     . carvedilol  3.125 mg Oral BID  . docusate sodium  100 mg Oral BID  . enoxaparin  30 mg Subcutaneous BID  . esomeprazole  40 mg Oral Q breakfast  . famotidine  20 mg Oral BID  . furosemide  20 mg Oral q morning - 10a  . iron polysaccharides  150 mg Oral Daily  . losartan  25 mg Oral q morning - 10a  . pregabalin  75 mg Oral Daily  . simvastatin  20 mg Oral q1800  . sodium chloride  250 mL Intravenous Once  . sodium chloride  500 mL Intravenous Once  . warfarin  4 mg Oral ONCE-1800  . Warfarin - Pharmacist Dosing Inpatient   Does not apply q1800  . DISCONTD: furosemide  80 mg Oral q morning - 10a    Assessment:  78 yo M s/p R TKA to re-start coumadin for VTE prophylaxis s/p TKA and for hx DVT (warfarin held prior to surgery)  Home dose = 4 mg daily except 2 mg on Fridays  INR subtherapeutic, but increasing with warfarin re-initiation  Lovenox 30mg  SQ Q12 hours ordered per MD  No bleeding reported in progress notes  Goal of Therapy:  INR 2-3   Plan:   Coumadin 4 mg PO tonight  D/C Lovenox since INR >/= 1.8 (per MD admin comments)  Daily  PT/INR   Darrol Angel, PharmD Pager: [phone] 10/08/2011 3:05 PM

## 2011-10-08 NOTE — Progress Notes (Signed)
Physical Therapy Treatment Patient Details Name: TRINTON PREWITT MRN: [id number] DOB: [birth year] Today's Date: 10/08/2011  PT Assessment/Plan  PT - Assessment/Plan Comments on Treatment Session: Pt with much improved sequencing and technique with RW with ambulation during pm session.  Pt also had no c/o dizziness with ambulation.   PT Plan: Discharge plan remains appropriate PT Frequency: Min 3X/week Follow Up Recommendations: Skilled nursing facility Equipment Recommended: Defer to next venue PT Goals  Acute Rehab PT Goals PT Goal Formulation: With patient Time For Goal Achievement: 7 days Pt will go Supine/Side to Sit: with supervision PT Goal: Supine/Side to Sit - Progress: Progressing toward goal Pt will go Sit to Supine/Side: with supervision PT Goal: Sit to Supine/Side - Progress: Progressing toward goal Pt will go Sit to Stand: with supervision PT Goal: Sit to Stand - Progress: Progressing toward goal Pt will go Stand to Sit: with supervision PT Goal: Stand to Sit - Progress: Progressing toward goal Pt will Ambulate: 51 - 150 feet;with supervision;with least restrictive assistive device PT Goal: Ambulate - Progress: Progressing toward goal  PT Treatment Precautions/Restrictions  Precautions Precautions: Knee Required Braces or Orthoses: Yes Knee Immobilizer: Discontinue once straight leg raise with < 10 degree lag Restrictions Weight Bearing Restrictions: Yes RLE Weight Bearing: Weight bearing as tolerated Mobility (including Balance) Bed Mobility Bed Mobility: Yes Supine to Sit: 4: Min assist;3: Mod assist;HOB flat Supine to Sit Details (indicate cue type and reason): Requires assist for RLE off of bed with increased assist for trunk due to [facility] flat with increased cuing for hand placement.  Sit to Supine: 1: +2 Total assist;Patient percentage (comment) Sit to Supine - Details (indicate cue type and reason): Pt assist 65%.  Requires assist for RLE into bed and some  assist for trunk for controlled descent.  Transfers Transfers: Yes Sit to Stand: 1: +2 Total assist;Patient percentage (comment);With upper extremity assist;From bed Sit to Stand Details (indicate cue type and reason): Pt assist 65%.  Cues for hand placement on bed.   Stand to Sit: 1: +2 Total assist;Patient percentage (comment);With upper extremity assist;To bed;To elevated surface Stand to Sit Details: Pt assist 80%.  Cues for hand placement and RLE management.  Ambulation/Gait Ambulation/Gait: Yes Ambulation/Gait Assistance: 1: +2 Total assist;Patient percentage (comment) Ambulation/Gait Assistance Details (indicate cue type and reason): Pt assist 70%.  Noted much improved sequencing/technique with increased distance.  Pt with no c/o dizziness while ambulating. Cues for upright posture.  Ambulation Distance (Feet): 50 Feet Assistive device: Rolling walker Gait Pattern: Step-to pattern;Decreased stance time - right;Decreased step length - left;Trunk flexed Gait velocity: decreased    Exercise    End of Session PT - End of Session Equipment Utilized During Treatment: Gait belt;Right knee immobilizer Activity Tolerance: Patient tolerated treatment well Patient left: in bed;with call bell in reach General Behavior During Session: [facility] for tasks performed Cognition: [facility] for tasks performed  Page, Meribeth Mattes 10/08/2011, 4:06 PM

## 2011-10-09 LAB — BASIC METABOLIC PANEL
BUN: 29 mg/dL — ABNORMAL HIGH (ref 6–23)
CO2: 27 mEq/L (ref 19–32)
Calcium: 8.4 mg/dL (ref 8.4–10.5)
Creatinine, Ser: 0.99 mg/dL (ref 0.50–1.35)
GFR calc non Af Amer: 72 mL/min — ABNORMAL LOW (ref >90)
Glucose, Bld: 137 mg/dL — ABNORMAL HIGH (ref 70–99)
Sodium: 133 mEq/L — ABNORMAL LOW (ref 135–145)

## 2011-10-09 LAB — CBC
HCT: 26.9 % — ABNORMAL LOW (ref 39.0–52.0)
MCHC: 34.2 g/dL (ref 30.0–36.0)
MCV: 92.4 fL (ref 78.0–100.0)
Platelets: 126 10*3/uL — ABNORMAL LOW (ref 150–400)
RDW: 14.1 % (ref 11.5–15.5)
WBC: 6.5 10*3/uL (ref 4.0–10.5)

## 2011-10-09 MED ORDER — WARFARIN SODIUM 2 MG PO TABS
2.0000 mg | ORAL_TABLET | Freq: Once | ORAL | Status: DC
  Filled 2011-10-09: qty 1

## 2011-10-09 MED ORDER — ACETAMINOPHEN 325 MG PO TABS: 650.0000 mg | ORAL_TABLET | Freq: Four times a day (QID) | ORAL | Status: DC | PRN

## 2011-10-09 MED ORDER — BISACODYL 10 MG RE SUPP: 10.0000 mg | Freq: Every day | RECTAL | Status: AC | PRN

## 2011-10-09 MED ORDER — ONDANSETRON HCL 4 MG PO TABS: 4.0000 mg | ORAL_TABLET | Freq: Four times a day (QID) | ORAL | Status: AC | PRN

## 2011-10-09 MED ORDER — METHOCARBAMOL 500 MG PO TABS: 500.0000 mg | ORAL_TABLET | Freq: Four times a day (QID) | ORAL | Status: AC | PRN

## 2011-10-09 MED ORDER — DSS 100 MG PO CAPS: 100.0000 mg | ORAL_CAPSULE | Freq: Two times a day (BID) | ORAL | Status: AC

## 2011-10-09 MED ORDER — POLYSACCHARIDE IRON COMPLEX 150 MG PO CAPS: 150.0000 mg | ORAL_CAPSULE | Freq: Every day | ORAL | Status: DC

## 2011-10-09 MED ORDER — TRAMADOL HCL 50 MG PO TABS: 50.0000 mg | ORAL_TABLET | Freq: Four times a day (QID) | ORAL | Status: AC | PRN

## 2011-10-09 MED ORDER — WARFARIN SODIUM 2 MG PO TABS
2.0000 mg | ORAL_TABLET | Freq: Once | ORAL | Status: AC
  Administered 2011-10-09: 2 mg via ORAL
  Filled 2011-10-09: qty 1

## 2011-10-09 MED ORDER — POLYETHYLENE GLYCOL 3350 17 G PO PACK: 17.0000 g | PACK | Freq: Every day | ORAL | Status: AC | PRN

## 2011-10-09 MED ORDER — FUROSEMIDE 40 MG PO TABS: 40.0000 mg | ORAL_TABLET | Freq: Every morning | ORAL | Status: DC

## 2011-10-09 MED ORDER — HYDROCODONE-ACETAMINOPHEN 5-500 MG PO TABS: 1.0000 | ORAL_TABLET | ORAL | Status: DC | PRN

## 2011-10-09 NOTE — Discharge Instructions (Signed)
Knee Rehabilitation, Guidelines Following Surgery Results after knee surgery are often greatly improved when you follow the exercise, range of motion and muscle strengthening exercises prescribed by your doctor. Safety measures are also important to protect the knee from further injury. Any time any of these exercises cause you to have increased pain or swelling in your knee joint, decrease the amount until you are comfortable again and slowly increase them. If you have problems or questions, call your caregiver or physical therapist for advice. HOME CARE INSTRUCTIONS   Remove items at home which could result in a fall. This includes throw rugs or furniture in walking pathways.   Continue medications as instructed.   You may shower or take tub baths when your staples or stitches are removed or as instructed.   Walk using crutches or walker as instructed.   Put weight on your legs and walk as much as is comfortable.   You may resume a sexual relationship in one month or when given the OK by your doctor.   Return to work as instructed by your doctor.   Do not drive a car for 6 weeks or as instructed.   Wear elastic stockings until instructed not to.   Make sure you keep all of your appointments after your operation with all of your doctors and caregivers.  RANGE OF MOTION AND STRENGTHENING EXERCISES Rehabilitation of the knee is important following a knee injury or an operation. After just a few days of immobilization, the muscles of the thigh which control the knee become weakened and shrink (atrophy). Knee exercises are designed to build up the tone and strength of the thigh muscles and to improve knee motion. Often times heat used for twenty to thirty minutes before working out will loosen up your tissues and help with improving the range of motion. These exercises can be done on a training (exercise) mat, on the floor, on a table or on a bed. Use what ever works the best and is most  comfortable for you Knee exercises include:  Leg Lifts - While your knee is still immobilized in a splint or cast, you can do straight leg raises. Lift the leg to 60 degrees, hold for 3 sec, and slowly lower the leg. Repeat 10-20 times 2-3 times daily. Perform this exercise against resistance later as your knee gets better.   Quad and Hamstring Sets - Tighten up the muscle on the front of the thigh (Quad) and hold for 5-10 sec. Repeat this 10-20 times hourly. Hamstring sets are done by pushing the foot backward against an object and holding for 5-10 sec. Repeat as with quad sets.  A rehabilitation program following serious knee injuries can speed recovery and prevent re-injury in the future due to weakened muscles. Contact your doctor or a physical therapist for more information on knee rehabilitation. MAKE SURE YOU:   Understand these instructions.   Will watch your condition.   Will get help right away if you are not doing well or get worse.  Document Released: 07/06/2005 Document Revised: 06/25/2011 Document Reviewed: 12/24/2006 [employer] Patient Information 2012 [employer].  Pick up stool softner and laxative for home. Do not submerge incision under water. May shower. Continue to use ice for pain and swelling from surgery.  

## 2011-10-09 NOTE — Progress Notes (Signed)
ANTICOAGULATION CONSULT NOTE - Follow up Consult  Pharmacy Consult for Warfarin Indication: VTE prophylaxis s/p R TKA, and Hx DVT  No Known Allergies  Patient Measurements: Height: 5\' 10"  (177.8 cm) Weight:  (pt not strong enough to stand) IBW/kg (Calculated) : 73    Vital Signs: Temp: 98.4 F (36.9 C) (03/22 0457) Temp src: Oral (03/22 0457) BP: 108/65 mmHg (03/22 0457) Pulse Rate: 80  (03/22 0457)  Labs:  Basename 10/09/11 0519 10/08/11 0505 10/07/11 0455  HGB 9.2* 9.7* --  HCT 26.9* 28.7* 31.0*  PLT 126* 132* 135*  APTT -- -- --  LABPROT 24.2* 21.9* 19.8*  INR 2.13* 1.88* 1.65*  HEPARINUNFRC -- -- --  CREATININE 0.99 1.16 1.02  CKTOTAL -- -- --  CKMB -- -- --  TROPONINI -- -- --   Estimated Creatinine Clearance: 55.3 ml/min (by C-G formula based on Cr of 0.99).   Medications:  Scheduled:     . carvedilol  3.125 mg Oral BID  . docusate sodium  100 mg Oral BID  . esomeprazole  40 mg Oral Q breakfast  . famotidine  20 mg Oral BID  . furosemide  20 mg Oral q morning - 10a  . iron polysaccharides  150 mg Oral Daily  . losartan  25 mg Oral q morning - 10a  . pregabalin  75 mg Oral Daily  . simvastatin  20 mg Oral q1800  . sodium chloride  500 mL Intravenous Once  . warfarin  4 mg Oral ONCE-1800  . Warfarin - Pharmacist Dosing Inpatient   Does not apply q1800  . DISCONTD: enoxaparin  30 mg Subcutaneous BID  . DISCONTD: furosemide  80 mg Oral q morning - 10a    Assessment:  78 yo M s/p R TKA to re-start coumadin for VTE prophylaxis s/p TKA and for hx DVT (warfarin held prior to surgery)  Home dose = 4 mg daily except 2 mg on Fridays  INR now therapeutic,   Lovenox 30mg  SQ Q12 d/c'd yesterday  No bleeding reported in progress notes  Goal of Therapy:  INR 2-3   Plan:   Coumadin 2 mg PO tonight  If patient is discharged today, suggest resuming usual home warfarin dose with next INR check on Monday 3/25  Daily PT/INR   Darrol Angel,  PharmD Pager: [phone] 10/09/2011 7:44 AM

## 2011-10-09 NOTE — Plan of Care (Signed)
Problem: Discharge Progression Outcomes Goal: Discharge plan in place and appropriate Outcome: Completed/Met Date Met:  10/09/11 SNF rehab

## 2011-10-09 NOTE — Progress Notes (Signed)
Physical Therapy Treatment Patient Details Name: [name] [name] MRN: [id number] DOB: 1922/11/21 Today's Date: 10/09/2011  PT Assessment/Plan  PT - Assessment/Plan Comments on Treatment Session: Pt continues to demonstrate improved sequencing/technique with ambulation.  Pt to D/C later today.  PT Plan: Discharge plan remains appropriate PT Frequency: Min 3X/week Follow Up Recommendations: Skilled nursing facility Equipment Recommended: Defer to next venue PT Goals  Acute Rehab PT Goals PT Goal Formulation: With patient Time For Goal Achievement: 7 days Pt will go Supine/Side to Sit: with supervision PT Goal: Supine/Side to Sit - Progress: Progressing toward goal Pt will go Sit to Supine/Side: with supervision PT Goal: Sit to Supine/Side - Progress: Progressing toward goal Pt will go Sit to Stand: with supervision PT Goal: Sit to Stand - Progress: Progressing toward goal Pt will go Stand to Sit: with supervision PT Goal: Stand to Sit - Progress: Progressing toward goal Pt will Ambulate: 51 - 150 feet;with supervision;with least restrictive assistive device PT Goal: Ambulate - Progress: Progressing toward goal Pt will Perform Home Exercise Program: with supervision, verbal cues required/provided PT Goal: Perform Home Exercise Program - Progress: Progressing toward goal  PT Treatment Precautions/Restrictions  Precautions Precautions: Knee Required Braces or Orthoses: Yes Knee Immobilizer: Discontinue once straight leg raise with < 10 degree lag Restrictions Weight Bearing Restrictions: Yes RLE Weight Bearing: Weight bearing as tolerated Mobility (including Balance) Bed Mobility Bed Mobility: Yes Supine to Sit: 4: Min assist;3: Mod assist;HOB flat Supine to Sit Details (indicate cue type and reason): Requires assist for RLE off of bed with some assist for trunk into sitting.  Continue to provide max cuing for hand placement.  Sit to Supine: 4: Min assist;HOB flat Sit to Supine -  Details (indicate cue type and reason): Requires assist for RLE into bed with cues for UE placement to self assist trunk.  Transfers Transfers: Yes Sit to Stand: 3: Mod assist;From elevated surface;With upper extremity assist;From bed Sit to Stand Details (indicate cue type and reason): Pt requires assist for forward weight shift with cues for increasing knee flexion on LLE to attain standing position.  Cues for hand placement on bed.  Stand to Sit: 4: Min assist;With upper extremity assist;To elevated surface;To bed Stand to Sit Details: Some assist for controlled descent with cues for hand placement and RLE management.  Ambulation/Gait Ambulation/Gait: Yes Ambulation/Gait Assistance: 4: Min assist;3: Mod assist Ambulation/Gait Assistance Details (indicate cue type and reason): Continues to have improved sequencing/technique with RW, however still requires some cues/assist initially.  cues provided for upright posture.  Ambulation Distance (Feet): 50 Feet Assistive device: Rolling walker Gait Pattern: Step-to pattern;Decreased stance time - right;Decreased step length - left;Trunk flexed Gait velocity: decreased    Exercise  Total Joint Exercises Ankle Circles/Pumps: AROM;Both;20 reps;Supine Quad Sets: AROM;Right;10 reps;Supine Short Arc Quad: AAROM;Right;10 reps;Supine Heel Slides: AAROM;Right;10 reps;Supine Straight Leg Raises: AAROM;Right;10 reps;Supine End of Session PT - End of Session Equipment Utilized During Treatment: Gait belt;Right knee immobilizer Activity Tolerance: Patient tolerated treatment well Patient left: in bed;with call bell in reach General Behavior During Session: [facility] for tasks performed Cognition: [facility] for tasks performed  Page, Meribeth Mattes 10/09/2011, 11:30 AM

## 2011-10-09 NOTE — Discharge Summary (Signed)
Physician Discharge Summary   Patient ID: [name] [name] MRN: [id number] DOB/AGE: 1922/09/04 78 y.o.  Admit date: 10/05/2011 Discharge date: 10/09/2011  Primary Diagnosis:  Osteoarthritis right knee  Admission Diagnoses: Past Medical History  Diagnosis Date  . Coronary atherosclerosis of unspecified type of vessel, native or graft   . Other primary cardiomyopathies   . Other left bundle branch block   . DVT (deep venous thrombosis)     Hx of it. -7 yrs ago  . Other and unspecified hyperlipidemia   . Type II or unspecified type diabetes mellitus without mention of complication, not stated as uncontrolled   . Esophageal reflux   . Diverticulosis of colon (without mention of hemorrhage)   . Irritable bowel syndrome   . Personal history of malignant neoplasm of prostate   . DJD (degenerative joint disease)   . Spinal stenosis, lumbar region, without neurogenic claudication   . Unspecified hereditary and idiopathic peripheral neuropathy   . Nonischemic cardiomyopathy     EF 35-40%  . Coronary disease     nonobstructive coronary disease at catheterzation 2002.   Marland Kitchen DVT (deep venous thrombosis)     Hx of it on Coumadin therapy  . Sinus bradycardia     requiring discontinuation of Toprol   . Prostate cancer     s/p radiation  . Diabetes mellitus   . Hyperlipidemia     with abnormal liver function tests on Zocor    Discharge Diagnoses:  Principal Problem:  *OA (osteoarthritis) of knee Active Problems:  PVC's (premature ventricular contractions)  Postop Transfusion  Postop Hypotension  Postop Hyponatremia  Postoperative symptomatic anemia  Procedure: Procedure(s) (LRB): TOTAL KNEE ARTHROPLASTY (Right)   Consults: cardiology  HPI: [name] [name] is a 78 y.o. year old male with end stage OA of his right knee with progressively worsening pain and dysfunction. He has constant pain, with activity and at rest and significant functional deficits with difficulties even with ADLs.  He has had extensive non-op management including analgesics, injections of cortisone and viscosupplements, and home exercise program, but remains in significant pain with significant dysfunction. Radiographs demonstrate bone on bone arthritis of the medial and patellofemoral compartments with subchondral sclerosis and varus deformity. He presents now for right Total Knee Arthroplasty.   Laboratory Data: Anti-coag visit on 09/30/2011  Component Date Value Range Status  . INR  09/30/2011 3.0   Final    Basename 10/09/11 0519 10/08/11 0505 10/07/11 0455  HGB 9.2* 9.7* 10.6*    Basename 10/09/11 0519 10/08/11 0505  WBC 6.5 9.2  RBC 2.91* 3.10*  HCT 26.9* 28.7*  PLT 126* 132*    Basename 10/09/11 0519 10/08/11 0505  NA 133* 133*  K 3.5 3.5  CL 99 97  CO2 27 28  BUN 29* 23  CREATININE 0.99 1.16  GLUCOSE 137* 138*  CALCIUM 8.4 8.6    Basename 10/09/11 0519 10/08/11 0505  LABPT -- --  INR 2.13* 1.88*    X-Rays:No results found.  EKG: Orders placed in visit on 10/06/11  . EKG 12-LEAD     Hospital Course: Patient was admitted to [facility] and taken to the OR and underwent the above state procedure without complications.  Patient tolerated the procedure well and was later transferred to the recovery room and then to the tele floor for postoperative care.  They were given PO and IV analgesics for pain control following their surgery.  They were given 24 hours of postoperative antibiotics and started on DVT  prophylaxis.   PT and OT were ordered for total joint protocol.  Discharge planning consulted to help with postop disposition and equipment needs. He wanted to look into [facility] for therapy.  Patient had a tough night on the evening of surgery requiring rapid response team evaluation for hypotensive episode.  Cardiology was consulted to assist with management of the patient. He was seen by Dr. Melburn Popper: Pt is known to me from previous visits. He is doing well  after right knee surgery. He is having some PVCs but he was having them pre op as well. No CP, dyspnea. Sats are OK. Will check Mag. And K. I think we can watch him overnight on tele and then transfer him to the ortho floor tomorrow. Will get an ECG in the AM. He is not having any signs or symptoms of coronary ischemia so We do not need to draw cardiac enzymes.  Patient had complaints of low pressure that first night requiring Rapid Response team to evaluate. He was doing fine the next morning.  Gave blood due to his heart history and hypotensive episode.  Started therapy. Plan is to go [facility] after hospital stay.  Hemovac drain was pulled without difficulty.  Slowly progressed with therapy into day two.  Denied any complaints of SOB/CP/Nausea/Vomiting/Palpitations/Heart racing. DVT Prophylaxis - Coumadin Protocol - INR 1.65 on day two, Lovenox Bridge coverage. Dressing was changed on day two and the incision was healing well.  By day three, the patient was slowly progressing with therapy due to continued bouts of low pressure.  Incision was healing well.  Patient has complaints of low pressure again. Receiving fluids. Watched for one more day and consider transfer over to [facility] at [facility] on Friday. Only got to walk short distance in the room on day three. Some elevated temp taht evening also. Worked on I.S. Rechecked labs. Patient was negative on fluids at that time. Gave additional fluids that morning on day four and monitored pressure. Reduced Lasix.DVT Prophylaxis - Coumadin Protocol - INR was up to 1.88 on day four. He was seen on day five and noted that he did much better with the afternoon session of PT on day four.  No complaints.  He was still negative on his fluids but pressure was better and no further complaints that evening.  Seen on Friday morning and doing well so he would be transferred over to [facility] later today. Please noted that his home Lasix was reduce from 80 mg  daily down to 40 mg in the hospital temporarily and will likely need to be increased back up once he is stable and further out from his postoperative period.   Discharge Medications: Prior to Admission medications   Medication Sig Start Date End Date Taking? Authorizing Provider  carvedilol (COREG) 3.125 MG tablet Take 1 tablet (3.125 mg total) by mouth 2 (two) times daily. 08/13/11 08/12/12 Yes Laurey Morale, MD  dicyclomine (BENTYL) 20 MG tablet Take 20 mg by mouth 3 (three) times daily as needed. irritable bowel syndrome 08/26/11  Yes Michele Mcalpine, MD  esomeprazole (NEXIUM) 40 MG capsule Take 1 capsule (40 mg total) by mouth daily before breakfast. 30 min before first meal of day. 08/26/11  Yes Michele Mcalpine, MD  losartan (COZAAR) 50 MG tablet Take 25 mg by mouth every morning.  08/13/11  Yes Laurey Morale, MD  pravastatin (PRAVACHOL) 40 MG tablet Take 1 tablet (40 mg total) by mouth at bedtime. 08/26/11  Yes Michele Mcalpine, MD  pregabalin (LYRICA) 75 MG capsule Take 1 capsule (75 mg total) by mouth daily. 08/25/11 08/24/12 Yes Michele Mcalpine, MD  ranitidine (ZANTAC) 150 MG tablet Take 150 mg by mouth 2 (two) times daily as needed. Heart burn 08/26/11  Yes Michele Mcalpine, MD  warfarin (COUMADIN) 2 MG tablet Take 2-4 mg by mouth daily. 1 tablet on Friday and 2 tablets all other days 08/26/11  Yes Michele Mcalpine, MD  acetaminophen (TYLENOL) 325 MG tablet Take 2 tablets (650 mg total) by mouth every 6 (six) hours as needed (or Fever >/= 101). 10/09/11 10/08/12  Akeia Perot Julien Girt, PA  bisacodyl (DULCOLAX) 10 MG suppository Place 1 suppository (10 mg total) rectally daily as needed. 10/09/11 10/19/11  Zahmir Lalla, PA  docusate sodium 100 MG CAPS Take 100 mg by mouth 2 (two) times daily. 10/09/11 10/19/11  Harish Bram, PA  furosemide (LASIX) 40 MG tablet Take 1 tablet (40 mg total) by mouth every morning. (Please note that this is a reduced dose from his normal 80 mg dose he was on at home) 10/09/11 10/08/12   Hayle Parisi Julien Girt, PA  HYDROcodone-acetaminophen (VICODIN) 5-500 MG per tablet Take 1-2 tablets by mouth every 4 (four) hours as needed for pain. Pain 10/09/11 10/08/12  Takeyla Million Julien Girt, PA  iron polysaccharides (NIFEREX) 150 MG capsule Take 1 capsule (150 mg total) by mouth daily. 10/09/11 10/08/12  Mattox Schorr, PA  methocarbamol (ROBAXIN) 500 MG tablet Take 1 tablet (500 mg total) by mouth every 6 (six) hours as needed. 10/09/11 10/19/11  Sariah Henkin, PA  ondansetron (ZOFRAN) 4 MG tablet Take 1 tablet (4 mg total) by mouth every 6 (six) hours as needed for nausea. 10/09/11 10/16/11  Yasenia Reedy, PA  polyethylene glycol (MIRALAX / GLYCOLAX) packet Take 17 g by mouth daily as needed. 10/09/11 10/12/11  Nelwyn Hebdon, PA  traMADol (ULTRAM) 50 MG tablet Take 1-2 tablets (50-100 mg total) by mouth every 6 (six) hours as needed. 10/09/11 10/19/11  Dunbar Buras, PA    Diet: heart healthy Activity:WBAT Follow-up:in 2 weeks, the week of April 1-5 Disposition: [facility] home Discharged Condition: good   Discharge Orders    Future Appointments: Provider: Department: Dept Phone: Center:   11/20/2011 9:45 AM [facility] [phone] None   11/20/2011 10:00 AM Laurey Morale, MD [facility] [phone] [email or web address]   02/24/2012 10:00 AM Lorin Picket Elayne Snare, MD [facility] [phone] None     Future Orders Please Complete By Expires   Diet - low sodium heart healthy      Call MD / Call 911      Comments:   If you experience chest pain or shortness of breath, CALL 911 and be transported to the hospital emergency room.  If you develope a fever above 101 F, pus (white drainage) or increased drainage or redness at the wound, or calf pain, call your surgeon's office.   Constipation Prevention      Comments:   Drink plenty of fluids.  Prune juice may be helpful.  You may use a stool softener, such as Colace (over the counter) 100 mg  twice a day.  Use MiraLax (over the counter) for constipation as needed.   Increase activity slowly as tolerated      Weight Bearing as taught in Physical Therapy      Comments:   Use a walker or crutches as instructed.   Discharge instructions      Comments:  Pick up stool softner and laxative for home. Do not submerge incision under water. May shower. Continue to use ice for pain and swelling from surgery.    Driving restrictions      Comments:   No driving   Lifting restrictions      Comments:   No lifting   TED hose      Comments:   Use stockings (TED hose) for 3 weeks on both leg(s).  You may remove them at night for sleeping.   Change dressing      Comments:   Change dressing daily with sterile 4 x 4 inch gauze dressing and apply TED hose.   Do not put a pillow under the knee. Place it under the heel.        Medication List  As of 10/09/2011  7:54 AM   STOP taking these medications         CALTRATE 600+D 600-400 MG-UNIT per tablet      fish oil-omega-3 fatty acids 1000 MG capsule      meloxicam 7.5 MG tablet      multivitamin tablet      VITAMIN B-12 PO      VITAMIN D-1000 MAX ST 1000 UNITS tablet         TAKE these medications         acetaminophen 325 MG tablet   Commonly known as: TYLENOL   Take 2 tablets (650 mg total) by mouth every 6 (six) hours as needed (or Fever >/= 101).      bisacodyl 10 MG suppository   Commonly known as: DULCOLAX   Place 1 suppository (10 mg total) rectally daily as needed.      carvedilol 3.125 MG tablet   Commonly known as: COREG   Take 1 tablet (3.125 mg total) by mouth 2 (two) times daily.      dicyclomine 20 MG tablet   Commonly known as: BENTYL   Take 20 mg by mouth 3 (three) times daily as needed. irritable bowel syndrome      DSS 100 MG Caps   Take 100 mg by mouth 2 (two) times daily.      esomeprazole 40 MG capsule   Commonly known as: NEXIUM   Take 1 capsule (40 mg total) by mouth daily before breakfast.  30 min before first meal of day.      furosemide 40 MG tablet   Commonly known as: LASIX   Take 1 tablet (40 mg total) by mouth every morning.      HYDROcodone-acetaminophen 5-500 MG per tablet   Commonly known as: VICODIN   Take 1-2 tablets by mouth every 4 (four) hours as needed for pain. Pain      iron polysaccharides 150 MG capsule   Commonly known as: NIFEREX   Take 1 capsule (150 mg total) by mouth daily.      losartan 50 MG tablet   Commonly known as: COZAAR   Take 25 mg by mouth every morning.      methocarbamol 500 MG tablet   Commonly known as: ROBAXIN   Take 1 tablet (500 mg total) by mouth every 6 (six) hours as needed.      ondansetron 4 MG tablet   Commonly known as: ZOFRAN   Take 1 tablet (4 mg total) by mouth every 6 (six) hours as needed for nausea.      polyethylene glycol packet   Commonly known as: MIRALAX / GLYCOLAX   Take 17 g by  mouth daily as needed.      pravastatin 40 MG tablet   Commonly known as: PRAVACHOL   Take 1 tablet (40 mg total) by mouth at bedtime.      pregabalin 75 MG capsule   Commonly known as: LYRICA   Take 1 capsule (75 mg total) by mouth daily.      ranitidine 150 MG tablet   Commonly known as: ZANTAC   Take 150 mg by mouth 2 (two) times daily as needed. Heart burn      traMADol 50 MG tablet   Commonly known as: ULTRAM   Take 1-2 tablets (50-100 mg total) by mouth every 6 (six) hours as needed.      warfarin 2 MG tablet   Commonly known as: COUMADIN   Take 2-4 mg by mouth daily. 1 tablet on Friday and 2 tablets all other days           Follow-up Information    Follow up with Loanne Drilling, MD. Schedule an appointment as soon as possible for a visit in 2 weeks. (Please have [facility] arrange follow up for patient the week of April 1-5 and assist with transportation.)    Contact information:   [facility] [address] [place] [address] [phone]           Signed: Patrica Duel 10/09/2011, 7:54 AM

## 2011-10-09 NOTE — Progress Notes (Signed)
Subjective: 4 Days Post-Op Procedure(s) (LRB): TOTAL KNEE ARTHROPLASTY (Right) Patient reports pain as mild.   Patient doing much better now.  Did much better with the afternoon session of PT yesterday.  Bed available at [facility] and will transfer over today.  Objective: Vital signs in last 24 hours: Temp:  [98.4 F (36.9 C)-100.3 F (37.9 C)] 98.4 F (36.9 C) (03/22 0457) Pulse Rate:  [80-94] 80  (03/22 0457) Resp:  [16-18] 16  (03/22 0457) BP: (103-120)/(56-65) 108/65 mmHg (03/22 0457) SpO2:  [92 %-93 %] 93 % (03/22 0457)  Intake/Output from previous day:  Intake/Output Summary (Last 24 hours) at 10/09/11 0743 Last data filed at 10/08/11 2300  Gross per 24 hour  Intake     80 ml  Output    371 ml  Net   -291 ml    Intake/Output this shift:    Labs:  Basename 10/09/11 0519 10/08/11 0505 10/07/11 0455  HGB 9.2* 9.7* 10.6*    Basename 10/09/11 0519 10/08/11 0505  WBC 6.5 9.2  RBC 2.91* 3.10*  HCT 26.9* 28.7*  PLT 126* 132*    Basename 10/09/11 0519 10/08/11 0505  NA 133* 133*  K 3.5 3.5  CL 99 97  CO2 27 28  BUN 29* 23  CREATININE 0.99 1.16  GLUCOSE 137* 138*  CALCIUM 8.4 8.6    Basename 10/09/11 0519 10/08/11 0505  LABPT -- --  INR 2.13* 1.88*    Exam: Neurovascular intact Sensation intact distally Incision - clean, dry, no drainage, healing Motor function intact - moving foot and toes well on exam.   Assessment/Plan: 4 Days Post-Op Procedure(s) (LRB): TOTAL KNEE ARTHROPLASTY (Right) Procedure(s) (LRB): TOTAL KNEE ARTHROPLASTY (Right) Past Medical History  Diagnosis Date  . Coronary atherosclerosis of unspecified type of vessel, native or graft   . Other primary cardiomyopathies   . Other left bundle branch block   . DVT (deep venous thrombosis)     Hx of it. -7 yrs ago  . Other and unspecified hyperlipidemia   . Type II or unspecified type diabetes mellitus without mention of complication, not stated as uncontrolled   . Esophageal  reflux   . Diverticulosis of colon (without mention of hemorrhage)   . Irritable bowel syndrome   . Personal history of malignant neoplasm of prostate   . DJD (degenerative joint disease)   . Spinal stenosis, lumbar region, without neurogenic claudication   . Unspecified hereditary and idiopathic peripheral neuropathy   . Nonischemic cardiomyopathy     EF 35-40%  . Coronary disease     nonobstructive coronary disease at catheterzation 2002.   Marland Kitchen DVT (deep venous thrombosis)     Hx of it on Coumadin therapy  . Sinus bradycardia     requiring discontinuation of Toprol   . Prostate cancer     s/p radiation  . Diabetes mellitus   . Hyperlipidemia     with abnormal liver function tests on Zocor   Principal Problem:  *OA (osteoarthritis) of knee Active Problems:  PVC's (premature ventricular contractions)  Postop Transfusion  Postop Hypotension  Postop Hyponatremia  Postoperative symptomatic anemia   Up with therapy Discharge to SNF Diet - heart healthy Follow up - in 2 weeks, the week of April 1-5 Activity - WBAT Condition Upon Discharge - Stable D/C Meds - See DC Summary DVT Prophylaxis - Coumadin Protocol, last INR is 2.13   ,  10/09/2011, 7:43 AM

## 2011-10-14 ENCOUNTER — Encounter (HOSPITAL_COMMUNITY): Payer: Self-pay | Admitting: Orthopedic Surgery

## 2011-10-26 LAB — PROTIME-INR

## 2011-11-04 ENCOUNTER — Other Ambulatory Visit: Payer: Self-pay | Admitting: Cardiology

## 2011-11-04 DIAGNOSIS — I5022 Chronic systolic (congestive) heart failure: Secondary | ICD-10-CM

## 2011-11-04 MED ORDER — CARVEDILOL 3.125 MG PO TABS: 3.1250 mg | ORAL_TABLET | Freq: Two times a day (BID) | ORAL | Status: DC

## 2011-11-10 ENCOUNTER — Telehealth: Payer: Self-pay | Admitting: Cardiology

## 2011-11-10 NOTE — Telephone Encounter (Signed)
Spoke with pt about his medications. 

## 2011-11-10 NOTE — Telephone Encounter (Signed)
Please return call to patient at [phone] regarding medication.  Patient has been in a nurse home for the last month and has some questions.

## 2011-11-20 ENCOUNTER — Ambulatory Visit (INDEPENDENT_AMBULATORY_CARE_PROVIDER_SITE_OTHER): Payer: Medicare Other | Admitting: *Deleted

## 2011-11-20 ENCOUNTER — Ambulatory Visit (INDEPENDENT_AMBULATORY_CARE_PROVIDER_SITE_OTHER): Payer: Medicare Other | Admitting: Cardiology

## 2011-11-20 ENCOUNTER — Encounter: Payer: Self-pay | Admitting: Cardiology

## 2011-11-20 VITALS — BP 107/60 | HR 63 | Resp 18 | Ht 70.0 in | Wt 190.0 lb

## 2011-11-20 DIAGNOSIS — I5022 Chronic systolic (congestive) heart failure: Secondary | ICD-10-CM

## 2011-11-20 DIAGNOSIS — R0989 Other specified symptoms and signs involving the circulatory and respiratory systems: Secondary | ICD-10-CM

## 2011-11-20 DIAGNOSIS — I82409 Acute embolism and thrombosis of unspecified deep veins of unspecified lower extremity: Secondary | ICD-10-CM

## 2011-11-20 DIAGNOSIS — R0600 Dyspnea, unspecified: Secondary | ICD-10-CM

## 2011-11-20 LAB — BASIC METABOLIC PANEL
BUN: 25 mg/dL — ABNORMAL HIGH (ref 6–23)
CO2: 28 mEq/L (ref 19–32)
Chloride: 106 mEq/L (ref 96–112)
Creatinine, Ser: 1.2 mg/dL (ref 0.4–1.5)
Glucose, Bld: 130 mg/dL — ABNORMAL HIGH (ref 70–99)

## 2011-11-20 LAB — BRAIN NATRIURETIC PEPTIDE: Pro B Natriuretic peptide (BNP): 387 pg/mL — ABNORMAL HIGH (ref 0.0–100.0)

## 2011-11-20 MED ORDER — LOSARTAN POTASSIUM 50 MG PO TABS: 25.0000 mg | ORAL_TABLET | Freq: Two times a day (BID) | ORAL | Status: DC

## 2011-11-20 MED ORDER — LOSARTAN POTASSIUM 25 MG PO TABS: 25.0000 mg | ORAL_TABLET | Freq: Two times a day (BID) | ORAL | Status: DC

## 2011-11-20 NOTE — Patient Instructions (Addendum)
Your physician recommends that you have lab work today  BMP, BNP  We will call you with your results.  Your physician has recommended you make the following change in your medication:  Take your Carvedilol 3.125mg   1 tablet twice a day Increase your Losartan to 25mg  1 tablet twice a day   Your physician recommends that you schedule a follow-up appointment in: 4 months with Dr. Shirlee Latch

## 2011-11-22 NOTE — Assessment & Plan Note (Signed)
History of DVT, on chronic coumadin.

## 2011-11-22 NOTE — Assessment & Plan Note (Signed)
Long history of nonischemic cardiomyopathy.  Last EF 25%.  NYHA class II symptoms.  No JVD, volume status looks ok today.  - Increase losartan to 25 mg bid.  - Take Coreg 3.125 mg bid rather than both pills once a day.   - Continue current Lasix dosing.  BMET/BNP today.   - Given IVCD, CRT may be an option => given age would not place ICD.  With current minimal exertional symptoms, would not push for CRT.

## 2011-11-22 NOTE — Progress Notes (Signed)
PCP: Dr. Kriste Basque  78 yo with history of nonischemic cardiomyopathy presents for cardiology followup.  Last echo was in 7/12 with moderately dilated LV and EF 25%.  He had right TKR since I saw him last without complication.  He is walking better since the knee operation.  He denies exertional dyspnea, orthopnea, or PND.  No chest pain.  He is taking his Coreg 3.125 mg 2 pills once a day (should be taking 3.125 mg bid).    Labs (2/12): K 4.4, creatinine 1.0, LDL 86, HDL 55 Labs (8/12): K 5.3, creatinine 1.1, LDL 98, HDL 57 Labs (9/12): BNP 206 Labs (10/12): K 4.7, creatinine 1.0 Labs (3/13): K 3.5, creatinine 0.94  PMH: 1. Nonischemic CMP: LHC (2002) with nonobstructive CAD.  Echo (12/10): EF 30-35%, diffuse hypokinesis worse in the inferior and posterior walls, mild AI, mild MR.  Echo (7/12): moderately dilated LV, EF 25%, diffuse hypokinesis, PA systolic pressure 49 mmHg.  2. H/o appendectomy 3. H/o DVT left leg, on chronic warfarin 4. IBS 5. H/o cholecystectomy 6. ERCP with sphincterotomy and stone removal 7. Prostate cancer: seed implantation.  8. Hernia repair 9. Bilateral TKR 10. Diabetes mellitus: Diet-controlled 11. Hyperlipidemia  SH: Married, no children.  Quit smoking.  Rare ETOH.   FH: Father with CAD.  ROS: All systems reviewed and negative except as per HPI.   Current Outpatient Prescriptions  Medication Sig Dispense Refill  . acetaminophen (TYLENOL) 325 MG tablet Take 2 tablets (650 mg total) by mouth every 6 (six) hours as needed (or Fever >/= 101).  60 tablet  0  . carvedilol (COREG) 3.125 MG tablet Take 1 tablet (3.125 mg total) by mouth 2 (two) times daily.  180 tablet  1  . dicyclomine (BENTYL) 20 MG tablet Take 20 mg by mouth 3 (three) times daily as needed. irritable bowel syndrome      . esomeprazole (NEXIUM) 40 MG capsule Take 1 capsule (40 mg total) by mouth daily before breakfast. 30 min before first meal of day.  90 capsule  3  . furosemide (LASIX) 40 MG  tablet Take 1 tablet (40 mg total) by mouth every morning.  30 tablet  0  . HYDROcodone-acetaminophen (VICODIN) 5-500 MG per tablet Take 1-2 tablets by mouth every 4 (four) hours as needed for pain. Pain  80 tablet  0  . iron polysaccharides (NIFEREX) 150 MG capsule Take 1 capsule (150 mg total) by mouth daily.  28 capsule  0  . losartan (COZAAR) 25 MG tablet Take 1 tablet (25 mg total) by mouth 2 (two) times daily.  180 tablet  3  . pravastatin (PRAVACHOL) 40 MG tablet Take 1 tablet (40 mg total) by mouth at bedtime.  90 tablet  3  . pregabalin (LYRICA) 75 MG capsule Take 1 capsule (75 mg total) by mouth daily.  30 capsule  5  . ranitidine (ZANTAC) 150 MG tablet Take 150 mg by mouth 2 (two) times daily as needed. Heart burn      . warfarin (COUMADIN) 2 MG tablet Take 2-4 mg by mouth daily. 1 tablet on Friday and 2 tablets all other days      . zolpidem (AMBIEN) 5 MG tablet       . DISCONTD: Calcium Carbonate-Vitamin D (CALTRATE 600+D) 600-400 MG-UNIT per tablet Take 1 tablet by mouth daily.       Marland Kitchen DISCONTD: carvedilol (COREG) 3.125 MG tablet Take 1 tablet (3.125 mg total) by mouth 2 (two) times daily with a meal.  180 tablet  3    BP 107/60  Pulse 63  Resp 18  Ht 5\' 10"  (1.778 m)  Wt 190 lb (86.183 kg)  BMI 27.26 kg/m2 General: elderly, NAD Neck: JVP difficult, no thyromegaly or thyroid nodule.  Lungs: Clear to auscultation bilaterally with normal respiratory effort. CV: Nondisplaced PMI.  Heart regular S1/S2, no S3/S4, 1/6 SEM.  1+ edema 1/3 up lower legs bilaterally.  No carotid bruit.  Normal pedal pulses.  Abdomen: Soft, nontender, no hepatosplenomegaly, no distention.  Neurologic: Alert and oriented x 3.  Psych: Normal affect. Extremities: No clubbing or cyanosis.

## 2011-12-02 ENCOUNTER — Ambulatory Visit (INDEPENDENT_AMBULATORY_CARE_PROVIDER_SITE_OTHER): Payer: Medicare Other | Admitting: *Deleted

## 2011-12-02 DIAGNOSIS — I82409 Acute embolism and thrombosis of unspecified deep veins of unspecified lower extremity: Secondary | ICD-10-CM

## 2011-12-16 ENCOUNTER — Ambulatory Visit (INDEPENDENT_AMBULATORY_CARE_PROVIDER_SITE_OTHER): Payer: Medicare Other | Admitting: Pharmacist

## 2011-12-16 DIAGNOSIS — I82409 Acute embolism and thrombosis of unspecified deep veins of unspecified lower extremity: Secondary | ICD-10-CM

## 2011-12-16 LAB — POCT INR: INR: 3.6

## 2011-12-30 ENCOUNTER — Ambulatory Visit (INDEPENDENT_AMBULATORY_CARE_PROVIDER_SITE_OTHER): Payer: Medicare Other | Admitting: *Deleted

## 2011-12-30 DIAGNOSIS — I82409 Acute embolism and thrombosis of unspecified deep veins of unspecified lower extremity: Secondary | ICD-10-CM

## 2012-01-05 ENCOUNTER — Other Ambulatory Visit: Payer: Self-pay | Admitting: Pulmonary Disease

## 2012-01-05 MED ORDER — HYDROCODONE-ACETAMINOPHEN 5-500 MG PO TABS: 1.0000 | ORAL_TABLET | Freq: Three times a day (TID) | ORAL | Status: DC | PRN

## 2012-01-13 ENCOUNTER — Telehealth: Payer: Self-pay | Admitting: Cardiology

## 2012-01-13 MED ORDER — FUROSEMIDE 40 MG PO TABS: 40.0000 mg | ORAL_TABLET | Freq: Every morning | ORAL | Status: DC

## 2012-01-13 NOTE — Telephone Encounter (Signed)
Lasix refill needed,  [facility] , pt out needs asap

## 2012-01-18 ENCOUNTER — Other Ambulatory Visit: Payer: Self-pay | Admitting: *Deleted

## 2012-01-18 ENCOUNTER — Ambulatory Visit (INDEPENDENT_AMBULATORY_CARE_PROVIDER_SITE_OTHER): Payer: Medicare Other | Admitting: *Deleted

## 2012-01-18 DIAGNOSIS — I82409 Acute embolism and thrombosis of unspecified deep veins of unspecified lower extremity: Secondary | ICD-10-CM

## 2012-01-18 MED ORDER — FUROSEMIDE 40 MG PO TABS: 40.0000 mg | ORAL_TABLET | Freq: Every morning | ORAL | Status: DC

## 2012-02-15 ENCOUNTER — Ambulatory Visit (INDEPENDENT_AMBULATORY_CARE_PROVIDER_SITE_OTHER): Payer: Medicare Other

## 2012-02-15 DIAGNOSIS — I82409 Acute embolism and thrombosis of unspecified deep veins of unspecified lower extremity: Secondary | ICD-10-CM

## 2012-02-24 ENCOUNTER — Encounter: Payer: Self-pay | Admitting: Pulmonary Disease

## 2012-02-24 ENCOUNTER — Ambulatory Visit (INDEPENDENT_AMBULATORY_CARE_PROVIDER_SITE_OTHER): Payer: Medicare Other | Admitting: Pulmonary Disease

## 2012-02-24 VITALS — BP 120/62 | HR 72 | Temp 97.1°F | Ht 70.0 in | Wt 190.0 lb

## 2012-02-24 DIAGNOSIS — I10 Essential (primary) hypertension: Secondary | ICD-10-CM

## 2012-02-24 DIAGNOSIS — M48061 Spinal stenosis, lumbar region without neurogenic claudication: Secondary | ICD-10-CM

## 2012-02-24 DIAGNOSIS — F419 Anxiety disorder, unspecified: Secondary | ICD-10-CM

## 2012-02-24 DIAGNOSIS — E119 Type 2 diabetes mellitus without complications: Secondary | ICD-10-CM

## 2012-02-24 DIAGNOSIS — I82409 Acute embolism and thrombosis of unspecified deep veins of unspecified lower extremity: Secondary | ICD-10-CM

## 2012-02-24 DIAGNOSIS — M1990 Unspecified osteoarthritis, unspecified site: Secondary | ICD-10-CM

## 2012-02-24 DIAGNOSIS — I5022 Chronic systolic (congestive) heart failure: Secondary | ICD-10-CM

## 2012-02-24 DIAGNOSIS — Z9289 Personal history of other medical treatment: Secondary | ICD-10-CM

## 2012-02-24 DIAGNOSIS — E785 Hyperlipidemia, unspecified: Secondary | ICD-10-CM

## 2012-02-24 DIAGNOSIS — M1710 Unilateral primary osteoarthritis, unspecified knee: Secondary | ICD-10-CM

## 2012-02-24 DIAGNOSIS — I2510 Atherosclerotic heart disease of native coronary artery without angina pectoris: Secondary | ICD-10-CM

## 2012-02-24 DIAGNOSIS — Z8546 Personal history of malignant neoplasm of prostate: Secondary | ICD-10-CM

## 2012-02-24 DIAGNOSIS — I428 Other cardiomyopathies: Secondary | ICD-10-CM

## 2012-02-24 MED ORDER — LOSARTAN POTASSIUM 25 MG PO TABS: 25.0000 mg | ORAL_TABLET | Freq: Two times a day (BID) | ORAL | Status: DC

## 2012-02-24 MED ORDER — HYDROCODONE-ACETAMINOPHEN 5-325 MG PO TABS: 1.0000 | ORAL_TABLET | Freq: Three times a day (TID) | ORAL | Status: AC | PRN

## 2012-02-24 MED ORDER — CARVEDILOL 3.125 MG PO TABS: 3.1250 mg | ORAL_TABLET | Freq: Two times a day (BID) | ORAL | Status: DC

## 2012-02-24 NOTE — Progress Notes (Signed)
Subjective:    Patient ID: [name] [name], male    DOB: 12-22-1922, 78 y.o.   MRN: [id number]  HPI 78 y/o WM w/ mult med problems here for a follow up OV...    ~  February 19, 2010:  Overall stable w/ CC= his arthritis/ DJD> pain in knees & neck... he saw DrBBrodie 6/11> stable but c/o dizzy on Lisinopril, therefore changed to Ramipril but his head felt funny, so now on  LOSARTAN, not on BBlocker due to brady... BS controlled on diet + exercise... Chol OK on Prav40... PSA remains "zero" s/p seed implants... he gets all his meds [facility].  ~  August 20, 2010:  He's had a good 47mo overall- CC= arthritis & he is holding off on right TKR from DrOlin per DrBBrodie's rec against surg;  he's been taking Lyrica ~Qod for "feet burning"/ neuropathy symptoms but doesn't like it cause he thinks it's making him gain weight> we discussed change to Gabapentin 100-200-300mg  Qhs to see if this is more agreeable to him.    He notes BS at home incr to ~145 ave & we will recheck FBS & A1c to see if he needs to start meds;  he wants to be sure he gets his permission slip signed for his free diabetic shoes.    He saw DrBB 12/11 for Cards f/u> doing well w/ his non-ischemic cardiomyopathy, EF= 30-35%, & no changes made; he will be following up w/ DrMcLean; he had some leg swelling & [facility] which [facility]- pt on Coumadin via the CC for hx DVT.  ~  February 18, 2011:  47mo ROV & he is c/o knee pain & nocturnal leg cramps for which he takes mustard;  He didn't like the Gabapentins- states it gave him hot flashes, and he prefers the [facility]...    He saw DrMcLean for Cards f/u 6/12> non-ischemic cardiomyopathy & he is quite active w/ golfing & treadmill, no CP but c/o knee pain etc;  He started BBlocker COREG 3.125mg  Bid & watching for any bradycardia (pt has already decreased this to 1/2 tab Bid "it made mu head feel funny");  f/u 2DEcho showed dilated LV w/ EF=25% & diffuse HK, dil LA, triv  AI noted;  EKG w/ NSR, IVCD, NSSTTWA...  ~  August 26, 2011:  47mo ROV & Dearion tells me he has right knee replacement surg sched for 10/06/11 w/ DrAlusio; he has been caring for wife s/p fx hip & he's having a lot of trouble getting around; he was prev rec NOT to have right TKR by DrBB in 2010>    He saw DrMcLean (Cards) 1/13 for f/u of his ischemic cardiomyopathy (Echo 7/12 w/ dilLV & EF25%, diffuse HK)> he denies CP, palpit, ch in DOE or edema; on Coreg, Lasix, Cozaar; labs showed normal renal function & BNP=361 (Lasix incr to 40 & Cozaar decr to 25); [place] [facility] will consult while he is in the [place]...    CXR shows mild cardiomegaly, clear lungs, NAD...    Chol stable on Prav40 (see below);  DM control still adeq on diet alone;  Hg=11.9 mild anemia...  ~  February 24, 2012:  47mo ROV & Bless had his right TKR from [facility] 3/13> did well in rehab as far as his knee was concerned but then developed LBP & some leg weakness, now seeing DrRamos for this eval, given Norco which he likes better than Vicodin, & further  eval w/ MRI is pending, he is currently getting some PT arranged by DrRamos;  Note> prev hx back injury yrs ago while working for [employer] w/ subseq surg...    He saw DrMcLean for Cards 5/13> nonischemic cardiomyopathy w/ dilLV & EF=25%; his Losartan 25mg  was incr to Bid; labs showed BUN=25, Creat=1.2, BNP=387 stable...    He saw DrDahlstedt, Urology 5/13> Hx prostate cancer w/ XRT 2002- prev saw DrPeterson, then Ottelin & released; hx OB & LUTS w/ freq & urgency on Gelnique (3pumps/d) but he never mentioned this med in our discussions...    We reviewed prob list, meds, xrays and labs> see below for updates >> CXR 2/13 showed mild cardiomeg, clear lungs, NAD.Marland KitchenMarland Kitchen EKG 4/13 showed NSR w/ PVCs, LAD, poor R prog, NSSTTWA... LABS 8/13:  [place]- ok x BS=173 A1c=6.7;  CBC- ok x Hg=11.3;  TSH=1.03;  PSA=0.02    1.  Non-obstructive CAD, Cardiomyopathy, LBBB - on LOSARTAN 25mg Bid now,  LASIX 40mg /d, and  COREG 3.125mg Bid... followed by Dr Shirlee Latch for Cards... his prev Toprol was stopped secondary to bradycardia, and he has prev refused to take Vasotec, c/o dizzy from Lisinopril, & head felt funny on Altace. ~  baseline EKG= NSR, LBBB, IVCD... ~  2DEcho 11/08 showed global HK, dil LV, EF=35-40%, mild AR/ MR...  ~  labs 6/09 showed BUN=20, Creat= 1.1, K= 4.8, BNP= 57... ~  2DEcho 5/10 showed LV mod dil w/ diffuse HK & EF= 30-35%, mild MR & AI, dil atria & incr PAsys=48. ~  Pre-op eval 12/10 by BB w/ repeat echo (no change)- pt states he advised against further ortho surg. ~  6/12:  Cards eval from DrMcLean> 2DEcho w/ EF=25% & diffuse HK; COREG added to his regimen... ~  2/13:  CXR showed mild cardiomegaly, clear lungs, NAD.Marland Kitchen. ~  5/13:  He had f/u DrMcLean> nonischemic cardiomyopathy w/ dilLV & EF=25%; his Losartan 25mg  was incr to Bid; labs showed BUN=25, Creat=1.2, BNP=387 stable. ~  8/13:  BP= 120/62 & stable medically; labs remain stable...  2.  Hx DVT - he continues on Coumadin and followed in the coumadin clinic...  3.  DM - he's on diet alone... he reports recent incr in his BS ave at home to ~145 range. ~  labs 2/08 showed BS= 128, HgA1c = 6.3 ~  labs 6/09 showed BS= 118, HgA1c= 6.3 ~  he saw DrGroat 2/10 for eye check- no retinopathy... ~  labs 8/10 showed BS= 109, A1c= 6.3 ~  labs 8/11 showed BS= 112, A1c=6.7 ~  labs 2/12 showed BS= 116, A1c= 6.7.Marland KitchenMarland Kitchen OK to continue diet Rx. ~  Labs 2/13 on diet alone showed BS= 127, A1c= 6.7.Marland KitchenMarland Kitchen Discussed low carb diet. ~  Labs 8/13 showed BS= 173, A1c= 6.7.Marland KitchenMarland Kitchen Needs better low carb diet...  4.  Chol - on PRAVASTATIN 40 and tolerating this well... prev had trouble w/ Zocor- increased LFTs. ~  FLP 5/08 showed TChol 212, TG 77, HDL 75, LDL 108 ~  FLP 6/09 showed TChol 180, TG 80, HDL 54, LDL 110 ~  FLP 2/10 showed TChol 226, TG 43, HDL 65, LDL 148... he declined to incr the Prav to 80. ~  FLP 8/10 showed TChol 187, TG 87, HDL 52, LDL 87 ~  FLP 8/11  showed TChol 176, TG 87, HDL 56, LDL 102 ~  FLP 2/12 showed TChol 157, TG 82, HDL 55, LDL 86... Continue same. ~  FLP 2/13 on Prav40 showed TChol 146, TG 53, HDL 58, LDL 78  5.  DJD/ KNEE Arthritis/ LBP - he had a Left TKR by DrOlin 8/08... he went to the [facility] post op to recoup, then back home (& required EUA w/ manipulation to lyse adhesions)> after that his wife fell and broke her hip...  now having incr difficulty w/ right knee and had shots from Ortho... he takes VICODIN Tid Prn,  MOBIC 7.5mg  Prn, along w/ Calcium, MVI, & Vit D... he's been on LYRICA 75mg  Prn (ave Qod) for neuropathy in feet> he was intol to Gabapentin w/ "hot flashes" ~  DrOlin planned right TKR 1/11- pre-op eval from Cards & pt reports they advised against surg by DrBB therefore surg not done. ~  2/12:  c/o weight gain on Lyrica (~Qod) for neuropathy; rec ch to Gabapentin 100>200>300mg  Qhs as trial, but subseq intol as above. ~  3/13:  He had Right TKR by DrAlusio & post op rehab at [facility]; knee improved but c/o LBP worse since knee surg & being evaluated by DrRamos... ~  8/13:  He reports being changed from Vicodin to [facility] 5/325 which he likes better; on-going LBP eval from DrRamos w/ MRI pending...  6. GERD &  IBS - on NEXIUM 40mg /d, & Zantac 150mg HS, Bentyl 20mg Tid Prn which really helps his IBS symptoms. ~  2/10:  c/o incr reflux & peptic symptoms... advised start PREVACID 30mg /d in AM & take Zantac HS...  ~  Subseq switched to NEXIUM 40mg /d + ZANTAC 150mg  HS...  7.  PROSTATE CANCER - followed by DrPeterson/ Ottelin/ Dahlstedt & treated w/ radium seeds/ XRT in 2000... ~  labs 2/10 showed PSA= 0.02 ~  labs 8/11 showed PSA= 0.02 ~  Labs 8/12 showed PSA= 0.02 ~  Labs 8/13 showed PSA= 0.02  8.  ANEMIA >> he has a borderline anemia & takes MVI... ~  Labs 8/10 showed Hg= 13.1, MCV= 95 ~  Labs 6/11 showed Hg= 12.4, MCV= 94 ~  Labs 8/12 showed Hg= 12.4, MCV= 97 ~  Labs 2/13 showed Hg= 11.9, MCV= 97 ~  [facility] showed Hg down to 9.2 ~  Labs 8/13 showed Hg= 11.3  9.  SHINGLES? - he states that he "caught" shingles from a guy he was sitting next to at a restaurant & broke out on his leg 2-3d later... he went to see Derm, DrLeschin & treated...   Past Surgical History  Procedure Date  . Appendectomy   . Knee arthroscopy     L knee. 1991 by Dr. Criss Alvine  . Ercp with sphincterotomy and stone removal     5/93 by Dr. Russella Dar  . Bilat inguinal hernia repair     1998 by Dr. Samuella Cota  . Radium seed implantation for prostate cancer     s/p. 1/00 by Dr. Vonita Moss and Dan Humphreys  . R cataract surgery     2004 by Dr. Elmer Picker  . Repair of right recurrent right inguinal hernia     12/07 by Dr. Lurene Shadow  . Basal cell cancer     L ear. s/p Moh's surgery 3/09 Dr. Alean Rinne  . Total knee arthroplasty     Left. 8/08 by Dr. Charlann Boxer  . Tonsillectomy 09-28-11    child  . Cholecystectomy     1985 by Dr. Julien Girt  . Total knee arthroplasty 10/05/2011    Procedure: TOTAL KNEE ARTHROPLASTY;  Surgeon: Loanne Drilling, MD;  Location: [facility];  Service: Orthopedics;  Laterality: Right;    Outpatient Encounter Prescriptions as of 02/24/2012  Medication  Sig Dispense Refill  . acetaminophen (TYLENOL) 325 MG tablet Take 2 tablets (650 mg total) by mouth every 6 (six) hours as needed (or Fever >/= 101).  60 tablet  0  . carvedilol (COREG) 3.125 MG tablet Take 1 tablet (3.125 mg total) by mouth 2 (two) times daily.  180 tablet  1  . dicyclomine (BENTYL) 20 MG tablet Take 20 mg by mouth 3 (three) times daily as needed. irritable bowel syndrome      . esomeprazole (NEXIUM) 40 MG capsule Take 1 capsule (40 mg total) by mouth daily before breakfast. 30 min before first meal of day.  90 capsule  3  . furosemide (LASIX) 40 MG tablet Take 1 tablet (40 mg total) by mouth every morning.  90 tablet  3  . HYDROcodone-acetaminophen (VICODIN) 5-500 MG per tablet Take 1 tablet by mouth every 8 (eight) hours as needed for pain. Pain  90 tablet   1  . losartan (COZAAR) 25 MG tablet Take 1 tablet (25 mg total) by mouth 2 (two) times daily.  180 tablet  3  . pravastatin (PRAVACHOL) 40 MG tablet Take 1 tablet (40 mg total) by mouth at bedtime.  90 tablet  3  . pregabalin (LYRICA) 75 MG capsule Take 1 capsule (75 mg total) by mouth daily.  30 capsule  5  . ranitidine (ZANTAC) 150 MG tablet Take 150 mg by mouth 2 (two) times daily as needed. Heart burn      . warfarin (COUMADIN) 2 MG tablet Take 2-4 mg by mouth daily. 1 tablet on Friday and 2 tablets all other days      . iron polysaccharides (NIFEREX) 150 MG capsule Take 1 capsule (150 mg total) by mouth daily.  28 capsule  0  . DISCONTD: zolpidem (AMBIEN) 5 MG tablet         No Known Allergies   Current Medications, Allergies, Past Medical History, Past Surgical History, Family History, and Social History were reviewed in [employer] record.    Review of Systems    See HPI - all other systems neg except as noted...  The patient complains of dyspnea on exertion.  The patient denies anorexia, fever, weight loss, weight gain, vision loss, decreased hearing, hoarseness, chest pain, syncope, peripheral edema, prolonged cough, headaches, hemoptysis, abdominal pain, melena, hematochezia, severe indigestion/heartburn, hematuria, incontinence, muscle weakness, suspicious skin lesions, transient blindness, difficulty walking, depression, unusual weight change, abnormal bleeding, enlarged lymph nodes, and angioedema.     Objective:   Physical Exam    WD, WN, 86 WM in NAD... GENERAL:  Alert & oriented; pleasant & cooperative... HEENT:  [place]/AT, EOM-wnl, PERRLA, EACs-clear, TMs-wnl, NOSE-clear, THROAT-clear & wnl. NECK:  Supple w/ fair ROM; no JVD; normal carotid impulses w/o bruits; no thyromegaly or nodules palpated; no lymphadenopathy. CHEST:  Clear to P & A; without wheezes/ rales/ or rhonchi. HEART:  Regular Rhythm;  Gr 1/6 SEM w/o rubs or gallops. ABDOMEN:  Soft &  nontender; normal bowel sounds; no organomegaly or masses detected. EXT:  s/p left TKR, mod-severe arthritic changes; mild venous insuffic changes, no signif edema. NEURO:  CN's intact; motor testing normal; sensory testing equivocal;  gait abnormal, balance fair. DERM:  No lesions noted; no rash etc...  RADIOLOGY DATA:  Reviewed in the EPIC EMR & discussed w/ the patient...  LABORATORY DATA:  Reviewed in the EPIC EMR & discussed w/ the patient...   Assessment & Plan:   Non-obstructive CAD, Cardiomyopathy w/ EF=25%, LBBB>  Followed  by [facility] & his note reviewed; Meds adjusted w/ Coreg 3.125Bid, Cozaar 25mg /d, Lasix40 Qam...   Hx DVT>  He remains on Coumadin via the CC; they will help coordinate perioperative management...  CHOL>  On Prav40 & tolerating this reasonably well...  DM>  Controlled on diet alone & reminded to avoid sugars & sweets...  GI> GERD, IBS>  Stable on meds, continue same...  Prostate Cancer>  follwed by DrDahlstadt & stable as above...  DJD>  His operative risk is elev given his age, comorbid conditions, cardiomyopathy; he is willing to accept these risks given the severity of his arthritis & approaching inability to walk;  Continue Mobic, Vicodin, Lyrica, etc...   Patient's Medications  New Prescriptions   HYDROCODONE-ACETAMINOPHEN (NORCO) 5-325 MG PER TABLET    Take 1 tablet by mouth 3 (three) times daily as needed for pain.  Previous Medications   ACETAMINOPHEN (TYLENOL) 325 MG TABLET    Take 2 tablets (650 mg total) by mouth every 6 (six) hours as needed (or Fever >/= 101).   DICYCLOMINE (BENTYL) 20 MG TABLET    Take 20 mg by mouth 3 (three) times daily as needed. irritable bowel syndrome   ESOMEPRAZOLE (NEXIUM) 40 MG CAPSULE    Take 1 capsule (40 mg total) by mouth daily before breakfast. 30 min before first meal of day.   FUROSEMIDE (LASIX) 40 MG TABLET    Take 1 tablet (40 mg total) by mouth every morning.   IRON POLYSACCHARIDES (NIFEREX) 150 MG CAPSULE     Take 1 capsule (150 mg total) by mouth daily.   PRAVASTATIN (PRAVACHOL) 40 MG TABLET    Take 1 tablet (40 mg total) by mouth at bedtime.   PREGABALIN (LYRICA) 75 MG CAPSULE    Take 1 capsule (75 mg total) by mouth daily.   RANITIDINE (ZANTAC) 150 MG TABLET    Take 150 mg by mouth 2 (two) times daily as needed. Heart burn   WARFARIN (COUMADIN) 2 MG TABLET    Take 2-4 mg by mouth daily. 1 tablet on Friday and 2 tablets all other days  Modified Medications   Modified Medication Previous Medication   CARVEDILOL (COREG) 3.125 MG TABLET carvedilol (COREG) 3.125 MG tablet      Take 1 tablet (3.125 mg total) by mouth 2 (two) times daily.    Take 1 tablet (3.125 mg total) by mouth 2 (two) times daily.   LOSARTAN (COZAAR) 25 MG TABLET losartan (COZAAR) 25 MG tablet      Take 1 tablet (25 mg total) by mouth 2 (two) times daily.    Take 1 tablet (25 mg total) by mouth 2 (two) times daily.  Discontinued Medications   HYDROCODONE-ACETAMINOPHEN (VICODIN) 5-500 MG PER TABLET    Take 1 tablet by mouth every 8 (eight) hours as needed for pain. Pain   ZOLPIDEM (AMBIEN) 5 MG TABLET

## 2012-02-24 NOTE — Patient Instructions (Addendum)
Today we updated your med list in our EPIC system...    Continue your current medications the same...  We wrote a new prescription for the [facility] to take for pain- one tab up to 3 times daily as needed...  Keep your follow up appt w/ DrRamos for the MRI & further back pain eval...  Today we did your follow up blood work...    We will call you w/ the results when avail...  Let's plan another routine follow up in 6 months, call sooner for problems.Marland KitchenMarland Kitchen

## 2012-02-26 ENCOUNTER — Other Ambulatory Visit (INDEPENDENT_AMBULATORY_CARE_PROVIDER_SITE_OTHER): Payer: Medicare Other

## 2012-02-26 DIAGNOSIS — I10 Essential (primary) hypertension: Secondary | ICD-10-CM

## 2012-02-26 DIAGNOSIS — F411 Generalized anxiety disorder: Secondary | ICD-10-CM

## 2012-02-26 DIAGNOSIS — E785 Hyperlipidemia, unspecified: Secondary | ICD-10-CM

## 2012-02-26 DIAGNOSIS — E119 Type 2 diabetes mellitus without complications: Secondary | ICD-10-CM

## 2012-02-26 DIAGNOSIS — Z8546 Personal history of malignant neoplasm of prostate: Secondary | ICD-10-CM

## 2012-02-26 DIAGNOSIS — F419 Anxiety disorder, unspecified: Secondary | ICD-10-CM

## 2012-02-26 DIAGNOSIS — I428 Other cardiomyopathies: Secondary | ICD-10-CM

## 2012-02-26 LAB — BASIC METABOLIC PANEL
Calcium: 9.2 mg/dL (ref 8.4–10.5)
GFR: 61.48 mL/min (ref >60.00)
Glucose, Bld: 173 mg/dL — ABNORMAL HIGH (ref 70–99)
Sodium: 140 mEq/L (ref 135–145)

## 2012-02-26 LAB — CBC WITH DIFFERENTIAL/PLATELET
Basophils Relative: 0.5 % (ref 0.0–3.0)
Eosinophils Absolute: 0.1 10*3/uL (ref 0.0–0.7)
Lymphocytes Relative: 26.8 % (ref 12.0–46.0)
MCHC: 33.8 g/dL (ref 30.0–36.0)
Neutrophils Relative %: 65.2 % (ref 43.0–77.0)
RBC: 3.57 Mil/uL — ABNORMAL LOW (ref 4.22–5.81)
WBC: 5.3 10*3/uL (ref 4.5–10.5)

## 2012-02-26 LAB — HEMOGLOBIN A1C: Hgb A1c MFr Bld: 6.7 % — ABNORMAL HIGH (ref 4.6–6.5)

## 2012-02-26 LAB — PSA: PSA: 0.02 ng/mL — ABNORMAL LOW (ref 0.10–4.00)

## 2012-02-26 LAB — HEPATIC FUNCTION PANEL
Albumin: 3.8 g/dL (ref 3.5–5.2)
Alkaline Phosphatase: 56 U/L (ref 39–117)
Total Bilirubin: 0.5 mg/dL (ref 0.3–1.2)

## 2012-02-29 ENCOUNTER — Encounter: Payer: Self-pay | Admitting: Pulmonary Disease

## 2012-03-14 ENCOUNTER — Ambulatory Visit (INDEPENDENT_AMBULATORY_CARE_PROVIDER_SITE_OTHER): Payer: Medicare Other | Admitting: Pharmacist

## 2012-03-14 DIAGNOSIS — I82409 Acute embolism and thrombosis of unspecified deep veins of unspecified lower extremity: Secondary | ICD-10-CM

## 2012-04-13 ENCOUNTER — Encounter: Payer: Self-pay | Admitting: Cardiology

## 2012-04-13 ENCOUNTER — Ambulatory Visit (INDEPENDENT_AMBULATORY_CARE_PROVIDER_SITE_OTHER): Payer: Medicare Other | Admitting: *Deleted

## 2012-04-13 ENCOUNTER — Ambulatory Visit (INDEPENDENT_AMBULATORY_CARE_PROVIDER_SITE_OTHER): Payer: Medicare Other | Admitting: Cardiology

## 2012-04-13 VITALS — BP 130/68 | HR 71 | Resp 18 | Ht 70.0 in | Wt 191.1 lb

## 2012-04-13 DIAGNOSIS — I5022 Chronic systolic (congestive) heart failure: Secondary | ICD-10-CM

## 2012-04-13 DIAGNOSIS — I82409 Acute embolism and thrombosis of unspecified deep veins of unspecified lower extremity: Secondary | ICD-10-CM

## 2012-04-13 DIAGNOSIS — E785 Hyperlipidemia, unspecified: Secondary | ICD-10-CM

## 2012-04-13 MED ORDER — CARVEDILOL 6.25 MG PO TABS: 6.2500 mg | ORAL_TABLET | Freq: Two times a day (BID) | ORAL | Status: DC

## 2012-04-13 NOTE — Patient Instructions (Addendum)
Increase coreg(carvedilol) to 6.25mg  two times a day. You can take two 3.125mg  tablets two  times a day and use your current supply.  Your physician recommends that you return for a FASTING lipid profile--this can be scheduled at the [facility] lab.  Your physician wants you to follow-up in: 6 months with Dr Shirlee Latch. (March 2014).You will receive a reminder letter in the mail two months in advance. If you don't receive a letter, please call our office to schedule the follow-up appointment.

## 2012-04-14 ENCOUNTER — Other Ambulatory Visit (INDEPENDENT_AMBULATORY_CARE_PROVIDER_SITE_OTHER): Payer: Medicare Other

## 2012-04-14 DIAGNOSIS — I5022 Chronic systolic (congestive) heart failure: Secondary | ICD-10-CM

## 2012-04-14 LAB — LIPID PANEL: VLDL: 22.2 mg/dL (ref 0.0–40.0)

## 2012-04-14 NOTE — Progress Notes (Signed)
Patient ID: [name] [name], male   DOB: 15-Oct-1922, 78 y.o.   MRN: [id number] PCP: Dr. Kriste Basque  78 yo with history of nonischemic cardiomyopathy presents for cardiology followup.  Last echo was in 7/12 with moderately dilated LV and EF 25%.  He is golfing 1-2 times a week.  He denies exertional dyspnea, orthopnea, or PND.  No chest pain.  Main complaint is low back pain.  He may get an epidural injection . No falls.    Labs (2/12): K 4.4, creatinine 1.0, LDL 86, HDL 55 Labs (8/12): K 5.3, creatinine 1.1, LDL 98, HDL 57 Labs (9/12): BNP 206 Labs (10/12): K 4.7, creatinine 1.0 Labs (3/13): K 3.5, creatinine 0.94 Labs (8/13): K 4.1, creatinine 1.2  PMH: 1. Nonischemic CMP: LHC (2002) with nonobstructive CAD.  Echo (12/10): EF 30-35%, diffuse hypokinesis worse in the inferior and posterior walls, mild AI, mild MR.  Echo (7/12): moderately dilated LV, EF 25%, diffuse hypokinesis, PA systolic pressure 49 mmHg.  2. H/o appendectomy 3. H/o DVT left leg, on chronic warfarin 4. IBS 5. H/o cholecystectomy 6. ERCP with sphincterotomy and stone removal 7. Prostate cancer: seed implantation.  8. Hernia repair 9. Bilateral TKR 10. Diabetes mellitus: Diet-controlled 11. Hyperlipidemia 12. Low back pain  SH: Married, no children.  Quit smoking.  Rare ETOH.   FH: Father with CAD.  ROS: All systems reviewed and negative except as per HPI.   Current Outpatient Prescriptions  Medication Sig Dispense Refill  . acetaminophen (TYLENOL) 325 MG tablet Take 2 tablets (650 mg total) by mouth every 6 (six) hours as needed (or Fever >/= 101).  60 tablet  0  . dicyclomine (BENTYL) 20 MG tablet Take 20 mg by mouth 3 (three) times daily as needed. irritable bowel syndrome      . esomeprazole (NEXIUM) 40 MG capsule Take 1 capsule (40 mg total) by mouth daily before breakfast. 30 min before first meal of day.  90 capsule  3  . furosemide (LASIX) 40 MG tablet Take 1 tablet (40 mg total) by mouth every morning.  90  tablet  3  . HYDROcodone-acetaminophen (NORCO/VICODIN) 5-325 MG per tablet Take 1 tablet by mouth every 8 (eight) hours as needed.       . iron polysaccharides (NIFEREX) 150 MG capsule Take 1 capsule (150 mg total) by mouth daily.  28 capsule  0  . losartan (COZAAR) 25 MG tablet Take 1 tablet (25 mg total) by mouth 2 (two) times daily.  180 tablet  3  . pravastatin (PRAVACHOL) 40 MG tablet Take 1 tablet (40 mg total) by mouth at bedtime.  90 tablet  3  . pregabalin (LYRICA) 75 MG capsule Take 1 capsule (75 mg total) by mouth daily.  30 capsule  5  . ranitidine (ZANTAC) 150 MG tablet Take 150 mg by mouth 2 (two) times daily as needed. Heart burn      . VOLTAREN 1 % GEL Apply 2 g topically 4 (four) times daily.       Marland Kitchen warfarin (COUMADIN) 2 MG tablet Take 2-4 mg by mouth daily. 1 tablet on Friday and 2 tablets all other days      . carvedilol (COREG) 6.25 MG tablet Take 1 tablet (6.25 mg total) by mouth 2 (two) times daily.  180 tablet  3  . DISCONTD: Calcium Carbonate-Vitamin D (CALTRATE 600+D) 600-400 MG-UNIT per tablet Take 1 tablet by mouth daily.         BP 130/68  Pulse 71  Resp 18  Ht 5\' 10"  (1.778 m)  Wt 191 lb 1.9 oz (86.691 kg)  BMI 27.42 kg/m2  SpO2 92% General: elderly, NAD Neck: No JVD, no thyromegaly or thyroid nodule.  Lungs: Clear to auscultation bilaterally with normal respiratory effort. CV: Nondisplaced PMI.  Heart regular S1/S2, no S3/S4, 2/6 HSM at apex.  1+ edema 1/4 up lower legs bilaterally.  No carotid bruit.  Normal pedal pulses.  Abdomen: Soft, nontender, no hepatosplenomegaly, no distention.  Neurologic: Alert and oriented x 3.  Psych: Normal affect. Extremities: No clubbing or cyanosis.   Assessment/Plan: SYSTOLIC HEART FAILURE, CHRONIC  Long history of nonischemic cardiomyopathy. Last EF 25%. NYHA class II symptoms. No JVD, volume status looks ok today.  - Continue losartan. - Increase Coreg to 6.25 mg bid.  - Continue current Lasix dosing - Given IVCD,  CRT may be an option => given age 78 would not place ICD. With current minimal exertional symptoms, would not push for CRT.  DVT  History of DVT, on chronic coumadin.  Hyperlipidemia Will check lipids.   Cian Costanzo [employer]

## 2012-05-03 ENCOUNTER — Other Ambulatory Visit: Payer: Self-pay | Admitting: Pulmonary Disease

## 2012-05-11 ENCOUNTER — Ambulatory Visit (INDEPENDENT_AMBULATORY_CARE_PROVIDER_SITE_OTHER): Payer: Medicare Other | Admitting: *Deleted

## 2012-05-11 DIAGNOSIS — I82409 Acute embolism and thrombosis of unspecified deep veins of unspecified lower extremity: Secondary | ICD-10-CM

## 2012-05-11 LAB — POCT INR: INR: 2.2

## 2012-06-08 ENCOUNTER — Ambulatory Visit (INDEPENDENT_AMBULATORY_CARE_PROVIDER_SITE_OTHER): Payer: Medicare Other | Admitting: General Practice

## 2012-06-08 ENCOUNTER — Telehealth: Payer: Self-pay | Admitting: Pulmonary Disease

## 2012-06-08 DIAGNOSIS — I82409 Acute embolism and thrombosis of unspecified deep veins of unspecified lower extremity: Secondary | ICD-10-CM

## 2012-06-08 LAB — POCT INR: INR: 2.2

## 2012-06-08 MED ORDER — WARFARIN SODIUM 2 MG PO TABS: 2.0000 mg | ORAL_TABLET | Freq: Every day | ORAL | Status: DC

## 2012-06-08 MED ORDER — DICLOFENAC SODIUM 1 % TD GEL: 2.0000 g | Freq: Four times a day (QID) | TRANSDERMAL | Status: DC

## 2012-06-08 NOTE — Telephone Encounter (Signed)
rx have been singed and placed in the mail to the pt.  Pt is aware.  Nothing further is needed.

## 2012-06-08 NOTE — Telephone Encounter (Signed)
rx printed and placed on SN cart to sign.  Will mail to the pt once completed.

## 2012-06-13 ENCOUNTER — Other Ambulatory Visit: Payer: Self-pay | Admitting: Pulmonary Disease

## 2012-07-22 ENCOUNTER — Ambulatory Visit (INDEPENDENT_AMBULATORY_CARE_PROVIDER_SITE_OTHER): Payer: Medicare Other | Admitting: General Practice

## 2012-07-22 DIAGNOSIS — I82409 Acute embolism and thrombosis of unspecified deep veins of unspecified lower extremity: Secondary | ICD-10-CM

## 2012-08-24 ENCOUNTER — Telehealth: Payer: Self-pay | Admitting: Pulmonary Disease

## 2012-08-24 ENCOUNTER — Ambulatory Visit (INDEPENDENT_AMBULATORY_CARE_PROVIDER_SITE_OTHER): Payer: Medicare Other | Admitting: General Practice

## 2012-08-24 DIAGNOSIS — I82409 Acute embolism and thrombosis of unspecified deep veins of unspecified lower extremity: Secondary | ICD-10-CM

## 2012-08-24 MED ORDER — WARFARIN SODIUM 2 MG PO TABS: ORAL_TABLET | ORAL | Status: DC

## 2012-08-24 NOTE — Telephone Encounter (Signed)
rx printed and placed on SN cart to sign with envelope. Pt will come pick-up. Carron Curie, CMA

## 2012-08-31 ENCOUNTER — Ambulatory Visit (INDEPENDENT_AMBULATORY_CARE_PROVIDER_SITE_OTHER): Payer: Medicare Other | Admitting: Pulmonary Disease

## 2012-08-31 ENCOUNTER — Other Ambulatory Visit (INDEPENDENT_AMBULATORY_CARE_PROVIDER_SITE_OTHER): Payer: Medicare Other

## 2012-08-31 ENCOUNTER — Encounter: Payer: Self-pay | Admitting: Pulmonary Disease

## 2012-08-31 VITALS — BP 108/68 | HR 68 | Temp 98.0°F | Ht 70.0 in | Wt 189.0 lb

## 2012-08-31 DIAGNOSIS — I428 Other cardiomyopathies: Secondary | ICD-10-CM

## 2012-08-31 DIAGNOSIS — M1990 Unspecified osteoarthritis, unspecified site: Secondary | ICD-10-CM

## 2012-08-31 DIAGNOSIS — K219 Gastro-esophageal reflux disease without esophagitis: Secondary | ICD-10-CM

## 2012-08-31 DIAGNOSIS — I82409 Acute embolism and thrombosis of unspecified deep veins of unspecified lower extremity: Secondary | ICD-10-CM

## 2012-08-31 DIAGNOSIS — E785 Hyperlipidemia, unspecified: Secondary | ICD-10-CM

## 2012-08-31 DIAGNOSIS — I2510 Atherosclerotic heart disease of native coronary artery without angina pectoris: Secondary | ICD-10-CM

## 2012-08-31 DIAGNOSIS — E119 Type 2 diabetes mellitus without complications: Secondary | ICD-10-CM

## 2012-08-31 DIAGNOSIS — I10 Essential (primary) hypertension: Secondary | ICD-10-CM

## 2012-08-31 DIAGNOSIS — K589 Irritable bowel syndrome without diarrhea: Secondary | ICD-10-CM

## 2012-08-31 DIAGNOSIS — R109 Unspecified abdominal pain: Secondary | ICD-10-CM

## 2012-08-31 DIAGNOSIS — D649 Anemia, unspecified: Secondary | ICD-10-CM

## 2012-08-31 DIAGNOSIS — M48061 Spinal stenosis, lumbar region without neurogenic claudication: Secondary | ICD-10-CM

## 2012-08-31 DIAGNOSIS — Z8546 Personal history of malignant neoplasm of prostate: Secondary | ICD-10-CM

## 2012-08-31 DIAGNOSIS — R197 Diarrhea, unspecified: Secondary | ICD-10-CM

## 2012-08-31 LAB — BASIC METABOLIC PANEL
BUN: 29 mg/dL — ABNORMAL HIGH (ref 6–23)
CO2: 25 mEq/L (ref 19–32)
Chloride: 102 mEq/L (ref 96–112)
Glucose, Bld: 112 mg/dL — ABNORMAL HIGH (ref 70–99)
Potassium: 4.3 mEq/L (ref 3.5–5.1)

## 2012-08-31 LAB — HEPATIC FUNCTION PANEL
ALT: 17 U/L (ref 0–53)
Alkaline Phosphatase: 52 U/L (ref 39–117)
Bilirubin, Direct: 0.1 mg/dL (ref 0.0–0.3)
Total Protein: 7.3 g/dL (ref 6.0–8.3)

## 2012-08-31 LAB — CBC WITH DIFFERENTIAL/PLATELET
Basophils Absolute: 0 10*3/uL (ref 0.0–0.1)
Eosinophils Absolute: 0.1 10*3/uL (ref 0.0–0.7)
Lymphocytes Relative: 28.2 % (ref 12.0–46.0)
MCHC: 34.3 g/dL (ref 30.0–36.0)
Neutrophils Relative %: 61.8 % (ref 43.0–77.0)
Platelets: 203 10*3/uL (ref 150.0–400.0)
RDW: 14.2 % (ref 11.5–14.6)

## 2012-08-31 LAB — HEMOGLOBIN A1C: Hgb A1c MFr Bld: 6.7 % — ABNORMAL HIGH (ref 4.6–6.5)

## 2012-08-31 LAB — SEDIMENTATION RATE: Sed Rate: 26 mm/hr — ABNORMAL HIGH (ref 0–22)

## 2012-08-31 MED ORDER — WARFARIN SODIUM 2 MG PO TABS: ORAL_TABLET | ORAL | Status: DC

## 2012-08-31 MED ORDER — RANITIDINE HCL 150 MG PO TABS: 150.0000 mg | ORAL_TABLET | Freq: Two times a day (BID) | ORAL | Status: DC | PRN

## 2012-08-31 MED ORDER — PRAVASTATIN SODIUM 40 MG PO TABS: 40.0000 mg | ORAL_TABLET | Freq: Every day | ORAL | Status: DC

## 2012-08-31 NOTE — Patient Instructions (Addendum)
Today we updated your med list in our EPIC system...    Continue your current medications the same...  Today we did some follow up blood work & we will sched a CT scan of your Abd for further eval...    We will call you w/ the results as soon as they are back...  Call for any questions...  Let's continue our 42mo follow up visits, but call anytime if needed for problems.Marland KitchenMarland Kitchen

## 2012-08-31 NOTE — Progress Notes (Addendum)
Subjective:    Patient ID: [name] [name], male    DOB: [birth year], 79 y.o.   MRN: [id number]  HPI 79 y/o WM w/ mult med problems here for a follow up OV...    ~  August 20, 2010:  He's had a good 43mo overall- CC= arthritis & he is holding off on right TKR from DrOlin per DrBBrodie's rec against surg;  he's been taking Lyrica ~Qod for "feet burning"/ neuropathy symptoms but doesn't like it cause he thinks it's making him gain weight> we discussed change to Gabapentin 100-200-300mg  Qhs to see if this is more agreeable to him.    He notes BS at home incr to ~145 ave & we will recheck FBS & A1c to see if he needs to start meds;  he wants to be sure he gets his permission slip signed for his free diabetic shoes.    He saw DrBB 12/11 for Cards f/u> doing well w/ his non-ischemic cardiomyopathy, EF= 30-35%, & no changes made; he will be following up w/ DrMcLean; he had some leg swelling & [facility] which [facility]- pt on Coumadin via the [facility] for hx DVT.  ~  February 18, 2011:  43mo ROV & he is c/o knee pain & nocturnal leg cramps for which he takes mustard;  He didn't like the Gabapentins- states it gave him hot flashes, and he prefers the [facility]...    He saw DrMcLean for Cards f/u 6/12> non-ischemic cardiomyopathy & he is quite active w/ golfing & treadmill, no CP but c/o knee pain etc;  He started BBlocker COREG 3.125mg Bid & watching for any bradycardia (pt has already decreased this to 1/2 tab Bid "it made my head feel funny");  f/u 2DEcho showed dilated LV w/ EF=25% & diffuse HK, dil LA, triv AI noted;  EKG w/ NSR, IVCD, NSSTTWA...  ~  August 26, 2011:  43mo ROV & Gregori tells me he has right knee replacement surg sched for 10/06/11 w/ DrAlusio; he has been caring for wife s/p fx hip & he's having a lot of trouble getting around; he was prev rec NOT to have right TKR by DrBB in 2010>    He saw DrMcLean (Cards) 1/13 for f/u of his ischemic cardiomyopathy (Echo 7/12 w/ dilLV &  EF25%, diffuse HK)> he denies CP, palpit, ch in DOE or edema; on Coreg, Lasix, Cozaar; labs showed normal renal function & BNP=361 (Lasix incr to 40 & Cozaar decr to 25); [place] [facility] will consult while he is in the [place]...    CXR shows mild cardiomegaly, clear lungs, NAD...    Chol stable on Prav40 (see below);  DM control still adeq on diet alone;  Hg=11.9 mild anemia...  ~  February 24, 2012:  43mo ROV & Abdirahim had his right TKR from [facility] 3/13> did well in rehab as far as his knee was concerned but then developed LBP & some leg weakness, now seeing DrRamos for this eval, given Norco which he likes better than Vicodin, & further eval w/ MRI is pending, he is currently getting some PT arranged by DrRamos;  Note> prev hx back injury yrs ago while working for [employer] w/ subseq surg...    He saw DrMcLean for Cards 5/13> nonischemic cardiomyopathy w/ dilLV & EF=25%; his Losartan 25mg  was incr to Bid; labs showed BUN=25, Creat=1.2, BNP=387 stable...    He saw DrDahlstedt, Urology 5/13> Hx prostate cancer w/ XRT 2002- prev saw DrPeterson, then  Ottelin & released; hx OB & LUTS w/ freq & urgency on Gelnique (3pumps/d) but he never mentioned this med in our discussions...    We reviewed prob list, meds, xrays and labs> see below for updates >> CXR 2/13 showed mild cardiomeg, clear lungs, NAD.Marland KitchenMarland Kitchen EKG 4/13 showed NSR w/ PVCs, LAD, poor R prog, NSSTTWA... LABS 8/13:  Chems- ok x BS=173 A1c=6.7;  CBC- ok x Hg=11.3;  TSH=1.03;  PSA=0.02  ~  August 31, 2012:  78mo ROV & Arrick is c/o a gastroenteritis w/ 4d diarrhea, took OTC rx & now improved- no further diarrhea in last 1-2d- but c/o vague discomfort- steady mid-abd area, cramping, nausea, no vomiting, denies heartburn, no fever or sweats "I got hot" and he notes Bentyl w/o help; we discussed CT Abd for further eval & he wants to proceed...     CAD, cardiomyopathy w/ EF=25%, LBBB> on Coreg6.25Bid, Losar25Bid, Lasix40; BP= 108/68 & he feels sl weak w/ mult somatic  complaints; followed by DrMcLean for Cards- seen 9/13 & Coreg increased to 6.25Bid...    Hx DVT on Coumadin> stable w/ protimes via the [facility]...    Chol> on Prav40; last FLP 9/13 showed TChol 160, TG 111, HDL 54, LDL 84    DM> on diet alone; labs 8/13 showed BS= 173, A1c= 6.7; repeat labs 2/14 => pending    GERD, IBS> on Nexium40, Zantac150, Bentyl20prn; c/o vague abd symptoms & we decided to check CT Abd=> pending    Prostate cancer> s/p radium seeds in 2000 & PSA has been reading zero ever since (last check 8/13= 0.02)...    DJD, knee & LBP> on Voltaren gel, Vicodin, [id number]; s/p bilat TKRs & he is c/o LBP w/ eval by DrRamos...    Anemia> on Niferex; labs 8/13 showed Hg=11.3 and repeat CBC 2/14 => pending We reviewed prob list, meds, xrays and labs> see below for updates >>  LABS 2/14:  Chems- ok x BS=112 A1c=6.7;  CBC- wnl w/ Hg=12.6 WBC=5.4;  Sed=26 CT Abd&Pelvis 2/14 => 1.8cm lesion on the left bladder wall (prob bladder cancer) & we will refer to Urology for further eval;  Incidental findings include cysts on Liver, s/p GB, divertics, atherosclerotic changes, etc...    1.  Non-obstructive CAD, Cardiomyopathy, LBBB - on LOSARTAN 25mg Bid now,  LASIX 40mg /d, and COREG 3.125mg Bid... followed by Dr Shirlee Latch for Cards... his prev Toprol was stopped secondary to bradycardia, and he has prev refused to take Vasotec, c/o dizzy from Lisinopril, & head felt funny on Altace. ~  Cath 2002 reported w/ nonobstructive CAD ~  baseline EKG= NSR, LBBB, IVCD... ~  2DEcho 11/08 showed global HK, dil LV, EF=35-40%, mild AR/ MR...  ~  labs 6/09 showed BUN=20, Creat= 1.1, K= 4.8, BNP= 57... ~  2DEcho 5/10 showed LV mod dil w/ diffuse HK & EF= 30-35%, mild MR & AI, dil atria & incr PAsys=48. ~  Pre-op eval 12/10 by BB w/ repeat echo (no change)- pt states he advised against further ortho surg. ~  6/12:  Cards eval from DrMcLean> 2DEcho w/ EF=25% & diffuse HK; COREG added to his regimen... ~  2/13:  CXR  showed mild cardiomegaly, clear lungs, NAD.Marland Kitchen. ~  5/13:  He had f/u DrMcLean> nonischemic cardiomyopathy w/ dilLV & EF=25%; his Losartan 25mg  was incr to Bid; labs showed BUN=25, Creat=1.2, BNP=387 stable. ~  8/13:  BP= 120/62 & stable medically; labs remain stable... ~  2/14: on Coreg6.25Bid, Losar25Bid, Lasix40; BP= 108/68 & he feels sl weak  w/ mult somatic complaints; followed by DrMcLean for Cards- seen 9/13 & Coreg increased to 6.25Bid...  2.  Hx DVT - he continues on Coumadin and followed in the coumadin clinic...  3.  DM - he's on diet alone... he reports recent incr in his BS ave at home to ~145 range. ~  labs 2/08 showed BS= 128, HgA1c = 6.3 ~  labs 6/09 showed BS= 118, HgA1c= 6.3 ~  he saw DrGroat 2/10 for eye check- no retinopathy... ~  labs 8/10 showed BS= 109, A1c= 6.3 ~  labs 8/11 showed BS= 112, A1c=6.7 ~  labs 2/12 showed BS= 116, A1c= 6.7.Marland KitchenMarland Kitchen OK to continue diet Rx. ~  Labs 2/13 on diet alone showed BS= 127, A1c= 6.7.Marland KitchenMarland Kitchen Discussed low carb diet. ~  Labs 8/13 showed BS= 173, A1c= 6.7.Marland KitchenMarland Kitchen Needs better low carb diet... ~  Labs 2/14 showed BS= 112, A1c= 6.7  4.  Chol - on PRAVASTATIN 40 and tolerating this well... prev had trouble w/ Zocor- increased LFTs. ~  FLP 5/08 showed TChol 212, TG 77, HDL 75, LDL 108 ~  FLP 6/09 showed TChol 180, TG 80, HDL 54, LDL 110 ~  FLP 2/10 showed TChol 226, TG 43, HDL 65, LDL 148... he declined to incr the Prav to 80. ~  FLP 8/10 showed TChol 187, TG 87, HDL 52, LDL 87 ~  FLP 8/11 showed TChol 176, TG 87, HDL 56, LDL 102 ~  FLP 2/12 showed TChol 157, TG 82, HDL 55, LDL 86... Continue same. ~  FLP 2/13 on Prav40 showed TChol 146, TG 53, HDL 58, LDL 78 ~  FLP 9/13 on Prav40 showed TChol 160, TG 111, HDL 54, LDL 84  5.  DJD/ KNEE Arthritis/ LBP - he had a Left TKR by DrOlin 8/08... he went to the [facility] post op to recoup, then back home (& required EUA w/ manipulation to lyse adhesions)> after that his wife fell and broke her hip...  now having  incr difficulty w/ right knee and had shots from Ortho... he takes VICODIN Tid Prn,  MOBIC 7.5mg  Prn, along w/ Calcium, MVI, & Vit D... he's been on LYRICA 75mg  Prn (ave Qod) for neuropathy in feet> he was intol to Gabapentin w/ "hot flashes" ~  DrOlin planned right TKR 1/11- pre-op eval from Cards & pt reports they advised against surg by DrBB therefore surg not done. ~  2/12:  c/o weight gain on Lyrica (~Qod) for neuropathy; rec ch to Gabapentin 100>200>300mg  Qhs as trial, but subseq intol as above. ~  3/13:  He had Right TKR by DrAlusio  & post op rehab at [facility]; knee improved but c/o LBP worse since knee surg & being evaluated by DrRamos... ~  8/13:  He reports being changed from Vicodin to [facility] 5/325 which he likes better; on-going LBP eval from DrRamos w/ MRI pending...  6. GERD &  IBS - on NEXIUM 40mg /d, & Zantac 150mg HS, Bentyl 20mg Tid Prn which really helps his IBS symptoms. ~  2/10:  c/o incr reflux & peptic symptoms... advised start PREVACID 30mg /d in AM & take Zantac HS...  ~  Subseq switched to NEXIUM 40mg /d + ZANTAC 150mg  HS... ~  2/14: presented w/ vague abd symptoms- ?gastroenteritis; labs ok & CT Abd => pending  7.  PROSTATE CANCER - followed by DrPeterson/ Ottelin/ Dahlstedt & treated w/ radium seeds/ XRT in 2000... ~  labs 2/10 showed PSA= 0.02 ~  labs 8/11 showed PSA= 0.02 ~  Labs 8/12 showed  PSA= 0.02 ~  Labs 8/13 showed PSA= 0.02  8.  ANEMIA >> he has a borderline anemia & takes MVI... ~  Labs 8/10 showed Hg= 13.1, MCV= 95 ~  Labs 6/11 showed Hg= 12.4, MCV= 94 ~  Labs 8/12 showed Hg= 12.4, MCV= 97 ~  Labs 2/13 showed Hg= 11.9, MCV= 97 ~  Labs in [facility] 3/13 showed Hg down to 9.2=> start FeSO4... ~  Labs 8/13 showed Hg= 11.3.Marland KitchenMarland Kitchen Continue Niferex. ~  Labs 2/14 showed Hg= 12.6 on Niferex one daily...  9.  SHINGLES? - he states that he "caught" shingles from a guy he was sitting next to at a restaurant & broke out on his leg 2-3d later... he went to see Derm, DrLeschin  & treated...   Past Surgical History  Procedure Laterality Date  . Appendectomy    . Knee arthroscopy      L knee. 1991 by Dr. Criss Alvine  . Ercp with sphincterotomy and stone removal      5/93 by Dr. Russella Dar  . Bilat inguinal hernia repair      1998 by Dr. Samuella Cota  . Radium seed implantation for prostate cancer      s/p. 1/00 by Dr. Vonita Moss and Dan Humphreys  . R cataract surgery      2004 by Dr. Elmer Picker  . Repair of right recurrent right inguinal hernia      12/07 by Dr. Lurene Shadow  . Basal cell cancer      L ear. s/p Moh's surgery 3/09 Dr. Alean Rinne  . Total knee arthroplasty      Left. 8/08 by Dr. Charlann Boxer  . Tonsillectomy  09-28-11    child  . Cholecystectomy      1985 by Dr. Julien Girt  . Total knee arthroplasty  10/05/2011    Procedure: TOTAL KNEE ARTHROPLASTY;  Surgeon: Loanne Drilling, MD;  Location: [facility];  Service: Orthopedics;  Laterality: Right;    Outpatient Encounter Prescriptions as of 08/31/2012  Medication Sig Dispense Refill  . acetaminophen (TYLENOL) 325 MG tablet Take 2 tablets (650 mg total) by mouth every 6 (six) hours as needed (or Fever >/= 101).  60 tablet  0  . carvedilol (COREG) 6.25 MG tablet Take 1 tablet (6.25 mg total) by mouth 2 (two) times daily.  180 tablet  3  . diclofenac sodium (VOLTAREN) 1 % GEL Apply 2 g topically 4 (four) times daily.  300 g  3  . dicyclomine (BENTYL) 20 MG tablet Take 20 mg by mouth 3 (three) times daily as needed. irritable bowel syndrome      . esomeprazole (NEXIUM) 40 MG capsule Take 1 capsule (40 mg total) by mouth daily before breakfast. 30 min before first meal of day.  90 capsule  3  . furosemide (LASIX) 40 MG tablet Take 1 tablet (40 mg total) by mouth every morning.  90 tablet  3  . HYDROcodone-acetaminophen (NORCO/VICODIN) 5-325 MG per tablet Take 1 tablet by mouth every 8 (eight) hours as needed.       . iron polysaccharides (NIFEREX) 150 MG capsule Take 1 capsule (150 mg total) by mouth daily.  28 capsule  0  . losartan (COZAAR) 25  MG tablet Take 1 tablet (25 mg total) by mouth 2 (two) times daily.  180 tablet  3  . LYRICA 75 MG capsule TAKE ONE CAPSULE EVERY DAY  30 capsule  5  . pravastatin (PRAVACHOL) 40 MG tablet Take 1 tablet (40 mg total) by mouth at bedtime.  90 tablet  3  . ranitidine (ZANTAC) 150 MG tablet Take 150 mg by mouth 2 (two) times daily as needed. Heart burn      . warfarin (COUMADIN) 2 MG tablet Take as directed per coumadin clinic.  150 tablet  0  . zolpidem (AMBIEN) 5 MG tablet TAKE 1 TABLET BY MOUTH AT BEDTIME  30 tablet  5   No facility-administered encounter medications on file as of 08/31/2012.    No Known Allergies   Current Medications, Allergies, Past Medical History, Past Surgical History, Family History, and Social History were reviewed in [employer] record.    Review of Systems    See HPI - all other systems neg except as noted...  The patient complains of dyspnea on exertion.  The patient denies anorexia, fever, weight loss, weight gain, vision loss, decreased hearing, hoarseness, chest pain, syncope, peripheral edema, prolonged cough, headaches, hemoptysis, abdominal pain, melena, hematochezia, severe indigestion/heartburn, hematuria, incontinence, muscle weakness, suspicious skin lesions, transient blindness, difficulty walking, depression, unusual weight change, abnormal bleeding, enlarged lymph nodes, and angioedema.     Objective:   Physical Exam    WD, WN, 87 WM in NAD... GENERAL:  Alert & oriented; pleasant & cooperative... HEENT:  [place]/AT, EOM-wnl, PERRLA, EACs-clear, TMs-wnl, NOSE-clear, THROAT-clear & wnl. NECK:  Supple w/ fair ROM; no JVD; normal carotid impulses w/o bruits; no thyromegaly or nodules palpated; no lymphadenopathy. CHEST:  Clear to P & A; without wheezes/ rales/ or rhonchi. HEART:  Regular Rhythm;  Gr 1/6 SEM w/o rubs or gallops. ABDOMEN:  Soft & nontender; normal bowel sounds; no organomegaly or masses detected. EXT:  s/p left TKR,  mod-severe arthritic changes; mild venous insuffic changes, no signif edema. NEURO:  CN's intact; motor testing normal; sensory testing equivocal;  gait abnormal, balance fair. DERM:  No lesions noted; no rash etc...  RADIOLOGY DATA:  Reviewed in the EPIC EMR & discussed w/ the patient...  LABORATORY DATA:  Reviewed in the EPIC EMR & discussed w/ the patient...   Assessment & Plan:    Non-obstructive CAD, Cardiomyopathy w/ EF=25%, LBBB>  Followed by [facility] & his note reviewed; Meds adjusted w/ Coreg 6.25Bid, Cozaar 25mg Bid, Lasix40 Qam...   Hx DVT>  He remains on Coumadin via the CC; they will help coordinate perioperative management...  CHOL>  On Prav40 & tolerating this reasonably well...  DM>  Controlled on diet alone & reminded to avoid sugars & sweets...  GI> GERD, IBS>  Recent gastroenteriitis & vague symptoms> w/u in progress- CBC is wnl, Sed=26, CT Abd=> pending  Prostate Cancer>  followed by DrDahlstadt & stable as above; CT Abd & Pelvis 2/14 w/ 1.8cm lesion on left bladder wall & referred back to Urology for further investigation...  DJD>  His operative risk is elev given his age, comorbid conditions, cardiomyopathy; he is willing to accept these risks given the severity of his arthritis & approaching inability to walk;  Continue Mobic, Vicodin, Lyrica, etc...   Patient's Medications  New Prescriptions   No medications on file  Previous Medications   ACETAMINOPHEN (TYLENOL) 325 MG TABLET    Take 2 tablets (650 mg total) by mouth every 6 (six) hours as needed (or Fever >/= 101).   CARVEDILOL (COREG) 6.25 MG TABLET    Take 1 tablet (6.25 mg total) by mouth 2 (two) times daily.   DICLOFENAC SODIUM (VOLTAREN) 1 % GEL    Apply 2 g topically 4 (four) times daily.   DICYCLOMINE (BENTYL) 20 MG TABLET  Take 20 mg by mouth 3 (three) times daily as needed. irritable bowel syndrome   ESOMEPRAZOLE (NEXIUM) 40 MG CAPSULE    Take 1 capsule (40 mg total) by mouth daily before  breakfast. 30 min before first meal of day.   FUROSEMIDE (LASIX) 40 MG TABLET    Take 1 tablet (40 mg total) by mouth every morning.   HYDROCODONE-ACETAMINOPHEN (NORCO/VICODIN) 5-325 MG PER TABLET    Take 1 tablet by mouth every 8 (eight) hours as needed.    IRON POLYSACCHARIDES (NIFEREX) 150 MG CAPSULE    Take 1 capsule (150 mg total) by mouth daily.   LOSARTAN (COZAAR) 25 MG TABLET    Take 1 tablet (25 mg total) by mouth 2 (two) times daily.   LYRICA 75 MG CAPSULE    TAKE ONE CAPSULE EVERY DAY   ZOLPIDEM (AMBIEN) 5 MG TABLET    TAKE 1 TABLET BY MOUTH AT BEDTIME  Modified Medications   Modified Medication Previous Medication   PRAVASTATIN (PRAVACHOL) 40 MG TABLET pravastatin (PRAVACHOL) 40 MG tablet      Take 1 tablet (40 mg total) by mouth at bedtime.    Take 1 tablet (40 mg total) by mouth at bedtime.   RANITIDINE (ZANTAC) 150 MG TABLET ranitidine (ZANTAC) 150 MG tablet      Take 1 tablet (150 mg total) by mouth 2 (two) times daily as needed. Heart burn    Take 150 mg by mouth 2 (two) times daily as needed. Heart burn   WARFARIN (COUMADIN) 2 MG TABLET warfarin (COUMADIN) 2 MG tablet      Take as directed per coumadin clinic.    Take as directed per coumadin clinic.  Discontinued Medications   No medications on file

## 2012-09-02 ENCOUNTER — Inpatient Hospital Stay: Admission: RE | Admit: 2012-09-02 | Payer: Medicare Other | Source: Ambulatory Visit

## 2012-09-05 ENCOUNTER — Ambulatory Visit (INDEPENDENT_AMBULATORY_CARE_PROVIDER_SITE_OTHER)
Admission: RE | Admit: 2012-09-05 | Discharge: 2012-09-05 | Disposition: A | Discharge status: Home / Self Care | Payer: Medicare Other | Source: Ambulatory Visit | Attending: Pulmonary Disease | Admitting: Pulmonary Disease

## 2012-09-05 DIAGNOSIS — R197 Diarrhea, unspecified: Secondary | ICD-10-CM

## 2012-09-05 DIAGNOSIS — R109 Unspecified abdominal pain: Secondary | ICD-10-CM

## 2012-09-05 MED ORDER — IOHEXOL 300 MG/ML  SOLN
100.0000 mL | Freq: Once | INTRAMUSCULAR | Status: AC | PRN
  Administered 2012-09-05: 100 mL via INTRAVENOUS

## 2012-09-06 ENCOUNTER — Other Ambulatory Visit: Payer: Self-pay | Admitting: Pulmonary Disease

## 2012-09-06 DIAGNOSIS — N329 Bladder disorder, unspecified: Secondary | ICD-10-CM

## 2012-09-12 ENCOUNTER — Other Ambulatory Visit: Payer: Self-pay | Admitting: Urology

## 2012-09-19 ENCOUNTER — Encounter (HOSPITAL_COMMUNITY): Payer: Self-pay | Admitting: Pharmacy Technician

## 2012-09-21 ENCOUNTER — Other Ambulatory Visit (HOSPITAL_COMMUNITY): Payer: Self-pay | Admitting: *Deleted

## 2012-09-22 ENCOUNTER — Encounter (HOSPITAL_COMMUNITY): Payer: Self-pay

## 2012-09-22 ENCOUNTER — Encounter (HOSPITAL_COMMUNITY)
Admission: RE | Admit: 2012-09-22 | Discharge: 2012-09-22 | Disposition: A | Discharge status: Home / Self Care | Payer: Medicare Other | Source: Ambulatory Visit | Attending: Urology | Admitting: Urology

## 2012-09-22 ENCOUNTER — Ambulatory Visit (HOSPITAL_COMMUNITY)
Admission: RE | Admit: 2012-09-22 | Discharge: 2012-09-22 | Disposition: A | Discharge status: Home / Self Care | Payer: Medicare Other | Source: Ambulatory Visit | Attending: Urology | Admitting: Urology

## 2012-09-22 DIAGNOSIS — J449 Chronic obstructive pulmonary disease, unspecified: Secondary | ICD-10-CM | POA: Insufficient documentation

## 2012-09-22 DIAGNOSIS — J4489 Other specified chronic obstructive pulmonary disease: Secondary | ICD-10-CM | POA: Insufficient documentation

## 2012-09-22 DIAGNOSIS — Z01812 Encounter for preprocedural laboratory examination: Secondary | ICD-10-CM | POA: Insufficient documentation

## 2012-09-22 DIAGNOSIS — Z01818 Encounter for other preprocedural examination: Secondary | ICD-10-CM | POA: Insufficient documentation

## 2012-09-22 DIAGNOSIS — J984 Other disorders of lung: Secondary | ICD-10-CM | POA: Insufficient documentation

## 2012-09-22 HISTORY — DX: Essential (primary) hypertension: I10

## 2012-09-22 LAB — APTT: aPTT: 45 seconds — ABNORMAL HIGH (ref 24–37)

## 2012-09-22 LAB — BASIC METABOLIC PANEL
Calcium: 10 mg/dL (ref 8.4–10.5)
Chloride: 102 mEq/L (ref 96–112)
Creatinine, Ser: 1.03 mg/dL (ref 0.50–1.35)
GFR calc Af Amer: 73 mL/min — ABNORMAL LOW (ref >90)
Sodium: 141 mEq/L (ref 135–145)

## 2012-09-22 LAB — SURGICAL PCR SCREEN: MRSA, PCR: NEGATIVE

## 2012-09-22 LAB — CBC
RBC: 4.06 MIL/uL — ABNORMAL LOW (ref 4.22–5.81)
WBC: 5.4 10*3/uL (ref 4.0–10.5)

## 2012-09-22 NOTE — Patient Instructions (Signed)
Forbes L Connole  09/22/2012                           YOUR PROCEDURE IS SCHEDULED ON: 09/30/12               PLEASE REPORT TO SHORT STAY CENTER AT : 9:00 AM               CALL THIS NUMBER IF ANY PROBLEMS THE DAY OF SURGERY :               [phone]                      REMEMBER:   Do not eat food or drink liquids AFTER MIDNIGHT   Take these medicines the morning of surgery with A SIP OF WATER: CARVEDILOL / NEXIUM   Do not wear jewelry, make-up   Do not wear lotions, powders, or perfumes.   Do not shave legs or underarms 12 hrs. before surgery (men may shave face)  Do not bring valuables to the hospital.  Contacts, dentures or bridgework may not be worn into surgery.  Leave suitcase in the car. After surgery it may be brought to your room.  For patients admitted to the hospital more than one night, checkout time is 11:00                          The day of discharge.   Patients discharged the day of surgery will not be allowed to drive home                             If going home same day of surgery, must have someone stay with you first                           24 hrs at home and arrange for some one to drive you home from hospital.    Special Instructions:   Please read over the following fact sheets that you were given:               1. MRSA  INFORMATION                      2. [facility] PREPARING FOR SURGERY SHEET                                                X_____________________________________________________________________        Failure to follow these instructions may result in cancellation of your surgery

## 2012-09-22 NOTE — Progress Notes (Signed)
09/22/12 1012  OBSTRUCTIVE SLEEP APNEA  Have you ever been diagnosed with sleep apnea through a sleep study? No  Do you snore loudly (loud enough to be heard through closed doors)?  0  Do you often feel tired, fatigued, or sleepy during the daytime? 1  Has anyone observed you stop breathing during your sleep? 0  Do you have, or are you being treated for high blood pressure? 1  BMI more than 35 kg/m2? 1  Age over 77 years old? 1  Neck circumference greater than 40 cm/18 inches? 0  Gender: 1  Obstructive Sleep Apnea Score 5  Score 4 or greater  Results sent to PCP

## 2012-09-30 ENCOUNTER — Encounter (HOSPITAL_COMMUNITY): Payer: Self-pay | Admitting: *Deleted

## 2012-09-30 ENCOUNTER — Ambulatory Visit (HOSPITAL_COMMUNITY): Payer: Medicare Other | Admitting: Anesthesiology

## 2012-09-30 ENCOUNTER — Encounter (HOSPITAL_COMMUNITY): Payer: Self-pay | Admitting: Anesthesiology

## 2012-09-30 ENCOUNTER — Encounter (HOSPITAL_COMMUNITY)
Admission: RE | Disposition: A | Discharge status: Home / Self Care | Payer: Self-pay | Source: Ambulatory Visit | Attending: Urology

## 2012-09-30 ENCOUNTER — Observation Stay (HOSPITAL_COMMUNITY)
Admission: RE | Admit: 2012-09-30 | Discharge: 2012-10-01 | Disposition: A | Discharge status: Home / Self Care | Payer: Medicare Other | Source: Ambulatory Visit | Attending: Urology | Admitting: Urology

## 2012-09-30 DIAGNOSIS — Z86718 Personal history of other venous thrombosis and embolism: Secondary | ICD-10-CM | POA: Insufficient documentation

## 2012-09-30 DIAGNOSIS — C679 Malignant neoplasm of bladder, unspecified: Principal | ICD-10-CM | POA: Insufficient documentation

## 2012-09-30 DIAGNOSIS — E119 Type 2 diabetes mellitus without complications: Secondary | ICD-10-CM | POA: Insufficient documentation

## 2012-09-30 DIAGNOSIS — Z7901 Long term (current) use of anticoagulants: Secondary | ICD-10-CM | POA: Insufficient documentation

## 2012-09-30 DIAGNOSIS — I10 Essential (primary) hypertension: Secondary | ICD-10-CM | POA: Insufficient documentation

## 2012-09-30 DIAGNOSIS — K219 Gastro-esophageal reflux disease without esophagitis: Secondary | ICD-10-CM | POA: Insufficient documentation

## 2012-09-30 DIAGNOSIS — C61 Malignant neoplasm of prostate: Secondary | ICD-10-CM | POA: Insufficient documentation

## 2012-09-30 DIAGNOSIS — Z79899 Other long term (current) drug therapy: Secondary | ICD-10-CM | POA: Insufficient documentation

## 2012-09-30 DIAGNOSIS — I2510 Atherosclerotic heart disease of native coronary artery without angina pectoris: Secondary | ICD-10-CM | POA: Insufficient documentation

## 2012-09-30 DIAGNOSIS — E785 Hyperlipidemia, unspecified: Secondary | ICD-10-CM | POA: Insufficient documentation

## 2012-09-30 HISTORY — PX: TRANSURETHRAL RESECTION OF BLADDER TUMOR: SHX2575

## 2012-09-30 LAB — GLUCOSE, CAPILLARY: Glucose-Capillary: 129 mg/dL — ABNORMAL HIGH (ref 70–99)

## 2012-09-30 SURGERY — TURBT (TRANSURETHRAL RESECTION OF BLADDER TUMOR)
Anesthesia: General | Wound class: Clean Contaminated

## 2012-09-30 MED ORDER — CIPROFLOXACIN HCL 250 MG PO TABS: 250.0000 mg | ORAL_TABLET | Freq: Two times a day (BID) | ORAL | Status: DC

## 2012-09-30 MED ORDER — CARVEDILOL 6.25 MG PO TABS
6.2500 mg | ORAL_TABLET | Freq: Two times a day (BID) | ORAL | Status: DC
  Administered 2012-09-30 – 2012-10-01 (×2): 6.25 mg via ORAL
  Filled 2012-09-30 (×4): qty 1

## 2012-09-30 MED ORDER — CEFAZOLIN SODIUM-DEXTROSE 2-3 GM-% IV SOLR
INTRAVENOUS | Status: AC
  Filled 2012-09-30: qty 50

## 2012-09-30 MED ORDER — MITOMYCIN CHEMO FOR BLADDER INSTILLATION 40 MG
INTRAVENOUS | Status: DC | PRN
  Administered 2012-09-30: 40 mg via INTRAVESICAL

## 2012-09-30 MED ORDER — MITOMYCIN CHEMO FOR BLADDER INSTILLATION 40 MG
40.0000 mg | Freq: Once | INTRAVENOUS | Status: DC
  Filled 2012-09-30: qty 40

## 2012-09-30 MED ORDER — GLYCOPYRROLATE 0.2 MG/ML IJ SOLN
INTRAMUSCULAR | Status: DC | PRN
  Administered 2012-09-30: .8 mg via INTRAVENOUS
  Administered 2012-09-30: 0.4 mg via INTRAVENOUS

## 2012-09-30 MED ORDER — ACETAMINOPHEN 10 MG/ML IV SOLN
INTRAVENOUS | Status: AC
  Filled 2012-09-30: qty 100

## 2012-09-30 MED ORDER — FENTANYL CITRATE 0.05 MG/ML IJ SOLN
INTRAMUSCULAR | Status: AC
  Filled 2012-09-30: qty 2

## 2012-09-30 MED ORDER — DOCUSATE SODIUM 100 MG PO CAPS
100.0000 mg | ORAL_CAPSULE | Freq: Two times a day (BID) | ORAL | Status: DC
  Administered 2012-09-30 – 2012-10-01 (×2): 100 mg via ORAL
  Filled 2012-09-30 (×3): qty 1

## 2012-09-30 MED ORDER — LACTATED RINGERS IV SOLN
INTRAVENOUS | Status: DC
  Administered 2012-09-30: 1000 mL via INTRAVENOUS

## 2012-09-30 MED ORDER — PREGABALIN 75 MG PO CAPS
75.0000 mg | ORAL_CAPSULE | Freq: Every day | ORAL | Status: DC
  Administered 2012-09-30 – 2012-10-01 (×2): 75 mg via ORAL
  Filled 2012-09-30 (×2): qty 1

## 2012-09-30 MED ORDER — LOSARTAN POTASSIUM 25 MG PO TABS
25.0000 mg | ORAL_TABLET | Freq: Two times a day (BID) | ORAL | Status: DC
  Administered 2012-09-30 – 2012-10-01 (×2): 25 mg via ORAL
  Filled 2012-09-30 (×3): qty 1

## 2012-09-30 MED ORDER — FENTANYL CITRATE 0.05 MG/ML IJ SOLN
25.0000 ug | INTRAMUSCULAR | Status: DC | PRN
  Administered 2012-09-30: 25 ug via INTRAVENOUS
  Administered 2012-09-30: 50 ug via INTRAVENOUS

## 2012-09-30 MED ORDER — PROPOFOL 10 MG/ML IV BOLUS
INTRAVENOUS | Status: DC | PRN
  Administered 2012-09-30: 150 mg via INTRAVENOUS

## 2012-09-30 MED ORDER — ACETAMINOPHEN 10 MG/ML IV SOLN
INTRAVENOUS | Status: DC | PRN
  Administered 2012-09-30: 1000 mg via INTRAVENOUS

## 2012-09-30 MED ORDER — EPHEDRINE SULFATE 50 MG/ML IJ SOLN
INTRAMUSCULAR | Status: DC | PRN
  Administered 2012-09-30 (×2): 5 mg via INTRAVENOUS

## 2012-09-30 MED ORDER — HYDROCODONE-ACETAMINOPHEN 5-325 MG PO TABS
1.0000 | ORAL_TABLET | Freq: Three times a day (TID) | ORAL | Status: DC | PRN
  Administered 2012-10-01: 1 via ORAL
  Filled 2012-09-30: qty 2

## 2012-09-30 MED ORDER — ZOLPIDEM TARTRATE 5 MG PO TABS
5.0000 mg | ORAL_TABLET | Freq: Every evening | ORAL | Status: DC | PRN
  Administered 2012-09-30: 5 mg via ORAL
  Filled 2012-09-30: qty 1

## 2012-09-30 MED ORDER — BELLADONNA ALKALOIDS-OPIUM 16.2-60 MG RE SUPP: 1.0000 | Freq: Four times a day (QID) | RECTAL | Status: DC | PRN

## 2012-09-30 MED ORDER — NEOSTIGMINE METHYLSULFATE 1 MG/ML IJ SOLN
INTRAMUSCULAR | Status: DC | PRN
  Administered 2012-09-30: 4 mg via INTRAVENOUS

## 2012-09-30 MED ORDER — CIPROFLOXACIN HCL 250 MG PO TABS
250.0000 mg | ORAL_TABLET | Freq: Two times a day (BID) | ORAL | Status: DC
  Administered 2012-09-30 – 2012-10-01 (×2): 250 mg via ORAL
  Filled 2012-09-30 (×4): qty 1

## 2012-09-30 MED ORDER — DEXTROSE-NACL 5-0.45 % IV SOLN
INTRAVENOUS | Status: DC
  Administered 2012-09-30 – 2012-10-01 (×2): via INTRAVENOUS

## 2012-09-30 MED ORDER — CARVEDILOL 6.25 MG PO TABS
6.2500 mg | ORAL_TABLET | Freq: Two times a day (BID) | ORAL | Status: DC
  Filled 2012-09-30: qty 1

## 2012-09-30 MED ORDER — FUROSEMIDE 40 MG PO TABS
40.0000 mg | ORAL_TABLET | Freq: Every morning | ORAL | Status: DC
  Administered 2012-09-30 – 2012-10-01 (×2): 40 mg via ORAL
  Filled 2012-09-30 (×2): qty 1

## 2012-09-30 MED ORDER — CEFAZOLIN SODIUM-DEXTROSE 2-3 GM-% IV SOLR
2.0000 g | INTRAVENOUS | Status: AC
  Administered 2012-09-30: 2 g via INTRAVENOUS

## 2012-09-30 MED ORDER — PROMETHAZINE HCL 25 MG/ML IJ SOLN: 6.2500 mg | INTRAMUSCULAR | Status: DC | PRN

## 2012-09-30 MED ORDER — STERILE WATER FOR IRRIGATION IR SOLN
Status: DC | PRN
  Administered 2012-09-30 (×2): 3000 mL

## 2012-09-30 MED ORDER — FENTANYL CITRATE 0.05 MG/ML IJ SOLN
INTRAMUSCULAR | Status: DC | PRN
  Administered 2012-09-30 (×2): 50 ug via INTRAVENOUS

## 2012-09-30 MED ORDER — ONDANSETRON HCL 4 MG/2ML IJ SOLN
INTRAMUSCULAR | Status: DC | PRN
  Administered 2012-09-30: 4 mg via INTRAVENOUS

## 2012-09-30 MED ORDER — LACTATED RINGERS IV SOLN: INTRAVENOUS | Status: DC

## 2012-09-30 MED ORDER — SIMVASTATIN 5 MG PO TABS
5.0000 mg | ORAL_TABLET | Freq: Every day | ORAL | Status: DC
  Administered 2012-09-30: 5 mg via ORAL
  Filled 2012-09-30 (×2): qty 1

## 2012-09-30 MED ORDER — ROCURONIUM BROMIDE 100 MG/10ML IV SOLN
INTRAVENOUS | Status: DC | PRN
  Administered 2012-09-30: 30 mg via INTRAVENOUS

## 2012-09-30 MED ORDER — PANTOPRAZOLE SODIUM 40 MG PO TBEC
40.0000 mg | DELAYED_RELEASE_TABLET | Freq: Every day | ORAL | Status: DC
  Administered 2012-10-01: 40 mg via ORAL
  Filled 2012-09-30: qty 1

## 2012-09-30 MED ORDER — FAMOTIDINE 10 MG PO TABS
10.0000 mg | ORAL_TABLET | Freq: Every day | ORAL | Status: DC
  Administered 2012-09-30 – 2012-10-01 (×2): 10 mg via ORAL
  Filled 2012-09-30 (×2): qty 1

## 2012-09-30 SURGICAL SUPPLY — 19 items
BAG URINE DRAINAGE (UROLOGICAL SUPPLIES) IMPLANT
BAG URO CATCHER STRL LF (DRAPE) ×2 IMPLANT
CATH FOLEY 2WAY SLVR  5CC 20FR (CATHETERS) ×1
CATH FOLEY 2WAY SLVR 5CC 20FR (CATHETERS) IMPLANT
CLOTH BEACON ORANGE TIMEOUT ST (SAFETY) ×2 IMPLANT
DRAPE CAMERA CLOSED 9X96 (DRAPES) ×2 IMPLANT
ELECT REM PT RETURN 9FT ADLT (ELECTROSURGICAL) ×2
ELECTRODE REM PT RTRN 9FT ADLT (ELECTROSURGICAL) ×1 IMPLANT
EVACUATOR MICROVAS BLADDER (UROLOGICAL SUPPLIES) IMPLANT
GLOVE BIOGEL M 8.0 STRL (GLOVE) ×2 IMPLANT
GOWN PREVENTION PLUS XLARGE (GOWN DISPOSABLE) ×2 IMPLANT
GOWN STRL REIN XL XLG (GOWN DISPOSABLE) ×2 IMPLANT
KIT ASPIRATION TUBING (SET/KITS/TRAYS/PACK) IMPLANT
LOOP CUTTING STRAIGHT (MISCELLANEOUS) ×1 IMPLANT
LOOPS RESECTOSCOPE DISP (ELECTROSURGICAL) ×2 IMPLANT
MANIFOLD NEPTUNE II (INSTRUMENTS) ×2 IMPLANT
PACK CYSTO (CUSTOM PROCEDURE TRAY) ×2 IMPLANT
SYRINGE IRR TOOMEY STRL 70CC (SYRINGE) IMPLANT
TUBING CONNECTING 10 (TUBING) ×2 IMPLANT

## 2012-09-30 NOTE — Anesthesia Preprocedure Evaluation (Signed)
Anesthesia Evaluation  Patient identified by MRN, date of birth, ID band Patient awake    Reviewed: Allergy & Precautions, H&P , NPO status , Patient's Chart, lab work & pertinent test results  Airway Mallampati: II TM Distance: >3 FB Neck ROM: Full    Dental no notable dental hx.    Pulmonary neg pulmonary ROS,  breath sounds clear to auscultation  Pulmonary exam normal       Cardiovascular hypertension, Pt. on medications and Pt. on home beta blockers + CAD (Nonobstructive CAD per chart history), + Peripheral Vascular Disease and DVT negative cardio ROS  + dysrhythmias IIRhythm:Regular Rate:Normal  lbbb Nonischemic cardiomyopathy ef 25%   Neuro/Psych  Neuromuscular disease negative neurological ROS  negative psych ROS   GI/Hepatic negative GI ROS, Neg liver ROS, GERD-  Medicated,  Endo/Other  negative endocrine ROSdiabetes  Renal/GU negative Renal ROS  negative genitourinary   Musculoskeletal negative musculoskeletal ROS (+)   Abdominal   Peds negative pediatric ROS (+)  Hematology negative hematology ROS (+)   Anesthesia Other Findings   Reproductive/Obstetrics negative OB ROS                           Anesthesia Physical Anesthesia Plan  ASA: III  Anesthesia Plan: General   Post-op Pain Management:    Induction: Intravenous  Airway Management Planned: Oral ETT  Additional Equipment:   Intra-op Plan:   Post-operative Plan: Extubation in OR  Informed Consent: I have reviewed the patients History and Physical, chart, labs and discussed the procedure including the risks, benefits and alternatives for the proposed anesthesia with the patient or authorized representative who has indicated his/her understanding and acceptance.   Dental advisory given  Plan Discussed with: CRNA  Anesthesia Plan Comments:         Anesthesia Quick Evaluation

## 2012-09-30 NOTE — H&P (Signed)
Urology History and Physical Exam  CC: bladder tumor  HPI: 79 year old male presents at this time for TURBT for a newly diagnosed left-sided bladder tumor. He had a recent CT scan ordered by Dr. Chauncey Mann which revealed a 1.5 cm left bladder wall abnormality, consistent with bladder tumor. Cystoscopy in February, 2014 verified a papillary lesion on the left bladder wall. There is no evidence ofother urothelial neoplasms. He presents at this time for TURBT followed by mitomycin instillation.  PMH: Past Medical History  Diagnosis Date  . Coronary atherosclerosis of unspecified type of vessel, native or graft   . Other primary cardiomyopathies   . Other left bundle branch block   . DVT (deep venous thrombosis)     Hx of it. -7 yrs ago  . Other and unspecified hyperlipidemia   . Type II or unspecified type diabetes mellitus without mention of complication, not stated as uncontrolled   . Diverticulosis of colon (without mention of hemorrhage)   . Irritable bowel syndrome   . Personal history of malignant neoplasm of prostate   . DJD (degenerative joint disease)   . Spinal stenosis, lumbar region, without neurogenic claudication   . Unspecified hereditary and idiopathic peripheral neuropathy   . Nonischemic cardiomyopathy     EF 35-40%  . Coronary disease     nonobstructive coronary disease at catheterzation 2002.   Marland Kitchen DVT (deep venous thrombosis)     Hx of it on Coumadin therapy  . Sinus bradycardia     requiring discontinuation of Toprol   . Diabetes mellitus   . Hyperlipidemia     with abnormal liver function tests on Zocor  . Hypertension   . History of skin cancer   . Esophageal reflux     OCCASIONAL  . Bladder tumor   . Difficulty sleeping   . Prostate cancer     s/p radiation    PSH: Past Surgical History  Procedure Laterality Date  . Appendectomy    . Knee arthroscopy      L knee. 1991 by Dr. Criss Alvine  . Ercp with sphincterotomy and stone removal      5/93 by Dr.  Russella Dar  . Bilat inguinal hernia repair      1998 by Dr. Samuella Cota  . Radium seed implantation for prostate cancer      s/p. 1/00 by Dr. Vonita Moss and Dan Humphreys  . R cataract surgery      2004 by Dr. Elmer Picker  . Repair of right recurrent right inguinal hernia      12/07 by Dr. Lurene Shadow  . Basal cell cancer      L ear. s/p Moh's surgery 3/09 Dr. Alean Rinne  . Total knee arthroplasty      Left. 8/08 by Dr. Charlann Boxer  . Tonsillectomy  09-28-11    child  . Cholecystectomy      1985 by Dr. Julien Girt  . Total knee arthroplasty  10/05/2011    Procedure: TOTAL KNEE ARTHROPLASTY;  Surgeon: Loanne Drilling, MD;  Location: [facility];  Service: Orthopedics;  Laterality: Right;    Allergies: No Known Allergies  Medications: Prescriptions prior to admission  Medication Sig Dispense Refill  . carvedilol (COREG) 6.25 MG tablet Take 1 tablet (6.25 mg total) by mouth 2 (two) times daily.  180 tablet  3  . dicyclomine (BENTYL) 20 MG tablet Take 20 mg by mouth 3 (three) times daily as needed. irritable bowel syndrome      . esomeprazole (NEXIUM) 40 MG capsule Take 1  capsule (40 mg total) by mouth daily before breakfast. 30 min before first meal of day.  90 capsule  3  . fish oil-omega-3 fatty acids 1000 MG capsule Take 1 g by mouth daily.      . furosemide (LASIX) 40 MG tablet Take 1 tablet (40 mg total) by mouth every morning.  90 tablet  3  . HYDROcodone-acetaminophen (NORCO/VICODIN) 5-325 MG per tablet Take 1 tablet by mouth every 8 (eight) hours as needed for pain.       Marland Kitchen losartan (COZAAR) 25 MG tablet Take 1 tablet (25 mg total) by mouth 2 (two) times daily.  180 tablet  3  . pravastatin (PRAVACHOL) 40 MG tablet Take 1 tablet (40 mg total) by mouth at bedtime.  90 tablet  3  . pregabalin (LYRICA) 75 MG capsule Take 75 mg by mouth daily.      . ranitidine (ZANTAC) 150 MG tablet Take 1 tablet (150 mg total) by mouth 2 (two) times daily as needed. Heart burn  90 tablet  3  . vitamin B-12 (CYANOCOBALAMIN) 1000 MCG  tablet Take 1,000 mcg by mouth daily.      Marland Kitchen zolpidem (AMBIEN) 5 MG tablet Take 5 mg by mouth at bedtime as needed for sleep.      Marland Kitchen warfarin (COUMADIN) 2 MG tablet Take 1-4 mg by mouth daily. Take 2 mg on Mon, 4 mg on Tues, 1 mg on Wed, 2 mg on Thur, 1 mg on Fri, 4 mg on Sat and Sun         Social History: History   Social History  . Marital Status: Married    Spouse Name: N/A    Number of Children: 0  . Years of Education: N/A   Occupational History  .     Social History Main Topics  . Smoking status: Former Smoker    Quit date: 07/20/1985  . Smokeless tobacco: Not on file  . Alcohol Use: No  . Drug Use: No  . Sexually Active: Not on file   Other Topics Concern  . Not on file   Social History Narrative   Married, no children.     Family History: Family History  Problem Relation Age of Onset  . Throat cancer Brother   . Coronary artery disease Father     deceased    Review of Systems: Positive:  Negative:   A further 10 point review of systems was negative except what is listed in the HPI.  Physical Exam: @VITALS2 @ General: No acute distress.  Awake. Head:  Normocephalic.  Atraumatic. ENT:  EOMI.  Mucous membranes moist Neck:  Supple.  No lymphadenopathy. CV:  S1 present. S2 present. Regular rate. Pulmonary: Equal effort bilaterally.  Clear to auscultation bilaterally. Abdomen: Soft.  Non tender to palpation. Skin:  Normal turgor.  No visible rash. Extremity: No gross deformity of bilateral upper extremities.  No gross deformity of    bilateral lower extremities. Neurologic: Alert. Appropriate mood.    Studies:  No results found for this basename: HGB, WBC, PLT,  in the last 72 hours  No results found for this basename: NA, K, CL, CO2, BUN, CREATININE, CALCIUM, MAGNESIUM, GFRNONAA, GFRAA,  in the last 72 hours   Recent Labs     09/30/12  0910  INR  1.14     No components found with this basename: ABG,     Assessment: papillary urothelial  carcinoma the bladder  Plan: TURBT followed by mitomycin administration

## 2012-09-30 NOTE — Anesthesia Postprocedure Evaluation (Signed)
Anesthesia Post Note  Patient: [name] [name]  Procedure(s) Performed: Procedure(s) (LRB): TRANSURETHRAL RESECTION OF BLADDER TUMOR (TURBT) (N/A)  Anesthesia type: General  Patient location: PACU  Post pain: Pain level controlled  Post assessment: Post-op Vital signs reviewed  Last Vitals:  Filed Vitals:   09/30/12 1315  BP: 121/52  Pulse: 63  Temp:   Resp: 16    Post vital signs: Reviewed  Level of consciousness: sedated  Complications: No apparent anesthesia complications

## 2012-09-30 NOTE — Care Management Note (Signed)
    Page 1 of 1   09/30/2012     4:01:31 PM   CARE MANAGEMENT NOTE 09/30/2012  Patient:  [name] [name], [name] [name]   Account Number:  [id number]  Date Initiated:  09/30/2012  Documentation initiated by:  Lanier Clam  Subjective/Objective Assessment:   ADMITTED W/BLADDER TUMOR.     Action/Plan:   FROM HOME.HAS PCP,PHARMACY.   Anticipated DC Date:  10/01/2012   Anticipated DC Plan:  HOME/SELF CARE      DC Planning Services  CM consult      Choice offered to / List presented to:             Status of service:  In process, will continue to follow Medicare Important Message given?   (If response is "NO", the following Medicare IM given date fields will be blank) Date Medicare IM given:   Date Additional Medicare IM given:    Discharge Disposition:    Per UR Regulation:  Reviewed for med. necessity/level of care/duration of stay  If discussed at Long Length of Stay Meetings, dates discussed:    Comments:  09/30/12 [facility] RN,BSN NCM [phone]

## 2012-09-30 NOTE — Transfer of Care (Signed)
Immediate Anesthesia Transfer of Care Note  Patient: [name] [name]  Procedure(s) Performed: Procedure(s) (LRB): TRANSURETHRAL RESECTION OF BLADDER TUMOR (TURBT) (N/A)  Patient Location: PACU  Anesthesia Type: General  Level of Consciousness: sedated, patient cooperative and responds to stimulaton  Airway & Oxygen Therapy: Patient Spontanous Breathing and Patient connected to face mask oxgen  Post-op Assessment: Report given to PACU RN and Post -op Vital signs reviewed and stable  Post vital signs: Reviewed and stable  Complications: No apparent anesthesia complications

## 2012-10-01 NOTE — Discharge Summary (Signed)
Physician Discharge Summary  Patient ID: [name] [name] MRN: [id number] DOB/AGE: 19-Sep-1922 79 y.o.  Admit date: 09/30/2012 Discharge date: 10/01/2012  Admission Diagnoses: Bladder tumor  Discharge Diagnoses: Same Active Problems: Bladder tumor   Discharged Condition: good  Hospital Course: [name] [name] had a TURBT with instillation of [facility] on 3/14 and is doing well.  His foley was removed and he has voided once with blood tinged urine.   He is doing well.  Consults: None  Significant Diagnostic Studies: none  Treatments: surgery: as above  Discharge Exam: Blood pressure 115/97, pulse 77, temperature 97.8 F (36.6 C), temperature source Oral, resp. rate 18, height 5\' 10"  (1.778 m), weight 90.13 kg (198 lb 11.2 oz), SpO2 97.00%. General appearance: alert and no distress  Disposition: 03-Skilled Nursing Facility  Discharge Orders   Future Appointments Provider Department Dept Phone   10/05/2012 10:00 AM [facility] [phone]   10/10/2012 11:30 AM Laurey Morale, MD [facility] [phone]   03/01/2013 10:00 AM Michele Mcalpine, MD [facility] [phone]   Future Orders Complete By Expires     Discontinue IV  As directed         Medication List    STOP taking these medications       warfarin 2 MG tablet  Commonly known as:  COUMADIN      TAKE these medications       carvedilol 6.25 MG tablet  Commonly known as:  COREG  Take 1 tablet (6.25 mg total) by mouth 2 (two) times daily.     ciprofloxacin 250 MG tablet  Commonly known as:  CIPRO  Take 1 tablet (250 mg total) by mouth 2 (two) times daily.     dicyclomine 20 MG tablet  Commonly known as:  BENTYL  Take 20 mg by mouth 3 (three) times daily as needed. irritable bowel syndrome     esomeprazole 40 MG capsule  Commonly known as:  NEXIUM  Take 1 capsule (40 mg total) by mouth daily before breakfast. 30 min before first meal  of day.     fish oil-omega-3 fatty acids 1000 MG capsule  Take 1 g by mouth daily.     furosemide 40 MG tablet  Commonly known as:  LASIX  Take 1 tablet (40 mg total) by mouth every morning.     HYDROcodone-acetaminophen 5-325 MG per tablet  Commonly known as:  NORCO/VICODIN  Take 1 tablet by mouth every 8 (eight) hours as needed for pain.     losartan 25 MG tablet  Commonly known as:  COZAAR  Take 1 tablet (25 mg total) by mouth 2 (two) times daily.     pravastatin 40 MG tablet  Commonly known as:  PRAVACHOL  Take 1 tablet (40 mg total) by mouth at bedtime.     pregabalin 75 MG capsule  Commonly known as:  LYRICA  Take 75 mg by mouth daily.     ranitidine 150 MG tablet  Commonly known as:  ZANTAC  Take 1 tablet (150 mg total) by mouth 2 (two) times daily as needed. Heart burn     vitamin B-12 1000 MCG tablet  Commonly known as:  CYANOCOBALAMIN  Take 1,000 mcg by mouth daily.     zolpidem 5 MG tablet  Commonly known as:  AMBIEN  Take 5 mg by mouth at bedtime as needed for sleep.           Follow-up Information  Follow up with Chelsea Aus, MD. (as scheduled)    Contact information:   [address] [place] [address] [phone]       Signed: Anner Crete 10/01/2012, 10:23 AM

## 2012-10-03 ENCOUNTER — Encounter (HOSPITAL_COMMUNITY): Payer: Self-pay | Admitting: Urology

## 2012-10-05 ENCOUNTER — Telehealth: Payer: Self-pay | Admitting: Pulmonary Disease

## 2012-10-05 ENCOUNTER — Ambulatory Visit (INDEPENDENT_AMBULATORY_CARE_PROVIDER_SITE_OTHER): Payer: Medicare Other | Admitting: General Practice

## 2012-10-05 DIAGNOSIS — I82409 Acute embolism and thrombosis of unspecified deep veins of unspecified lower extremity: Secondary | ICD-10-CM

## 2012-10-05 LAB — POCT INR: INR: 1

## 2012-10-05 NOTE — Telephone Encounter (Addendum)
Called and spoke with cindy-- she is aware that per SN---pt will need to follow up with urology to discuss starting back on the coumadin.  Arline Asp stated that she did call urology and they told her that they were ok with the pt starting back on coumadin if he is not having any bleeding.  Arline Asp stated the pt does not have a pending appt with urology but she will have him set up appt with them to discuss.  Pt to call back for any questions or concerns.  If this happens then we will schedule pt to come in and see SN.

## 2012-10-05 NOTE — Telephone Encounter (Signed)
Cindy @ ext [phone].  Is pt supposed to stop coumadin?  States pt had bladder surgery and surgeon told him to stop coumadin. Leanora Ivanoff

## 2012-10-05 NOTE — Telephone Encounter (Signed)
Spoke with Arline Asp, pt instructed to stop Coumadin prior to bladder surgery. Pt as [facility]in Clinic right now and Arline Asp is needing to know if patient is supposed to start coumadin back today.  Please advise SN on your recs for this patient. Thanks.

## 2012-10-06 ENCOUNTER — Telehealth: Payer: Self-pay | Admitting: General Practice

## 2012-10-06 NOTE — Telephone Encounter (Signed)
Spoke with patient and instructed him to take 6 mg of coumadin today, 4 mg on 3/21 and then resume current dosage.  Re-check in clinic on 3/28.

## 2012-10-06 NOTE — Telephone Encounter (Signed)
Message copied by Garrison Columbus on Thu Oct 06, 2012  1:50 PM ------      Message from: Marcine Matar      Created: Thu Oct 06, 2012 12:03 PM       OK to restart anytime      ----- Message -----         From: Garrison Columbus, RN         Sent: 10/05/2012  10:26 AM           To: Marcine Matar, MD            When do you want to re-start coumadin for this patient?  Patient was unsure and after checking with Dr. Jodelle Green office they referred question back to urology.              Thanks,      Bailey Mech, RN      [facility]       ------

## 2012-10-10 ENCOUNTER — Ambulatory Visit (INDEPENDENT_AMBULATORY_CARE_PROVIDER_SITE_OTHER): Payer: Medicare Other | Admitting: Cardiology

## 2012-10-10 ENCOUNTER — Encounter: Payer: Self-pay | Admitting: Cardiology

## 2012-10-10 VITALS — BP 132/62 | HR 52 | Ht 70.0 in | Wt 194.6 lb

## 2012-10-10 DIAGNOSIS — I5022 Chronic systolic (congestive) heart failure: Secondary | ICD-10-CM

## 2012-10-10 DIAGNOSIS — I428 Other cardiomyopathies: Secondary | ICD-10-CM

## 2012-10-10 DIAGNOSIS — I2510 Atherosclerotic heart disease of native coronary artery without angina pectoris: Secondary | ICD-10-CM

## 2012-10-10 LAB — BASIC METABOLIC PANEL
BUN: 24 mg/dL — ABNORMAL HIGH (ref 6–23)
CO2: 30 mEq/L (ref 19–32)
Chloride: 101 mEq/L (ref 96–112)
Creatinine, Ser: 1.2 mg/dL (ref 0.4–1.5)
Potassium: 4.4 mEq/L (ref 3.5–5.1)

## 2012-10-10 NOTE — Progress Notes (Signed)
Patient ID: [name] [name], male   DOB: 1923/06/05, 79 y.o.   MRN: [id number] PCP: Dr. Kriste Basque  79 yo with history of nonischemic cardiomyopathy presents for cardiology followup.  Last echo was in 7/12 with moderately dilated LV and EF 25%.  He is golfing 1-2 times a week when the weather allows. He denies exertional dyspnea, orthopnea, or PND.  No chest pain.  He has chronic abdominal discomfort.  He was diagnosed with bladder cancer this year and had TURBT in 3/14.  Weight is up 3 lbs compared to last appointment.   Labs (2/12): K 4.4, creatinine 1.0, LDL 86, HDL 55 Labs (8/12): K 5.3, creatinine 1.1, LDL 98, HDL 57 Labs (9/12): BNP 206 Labs (10/12): K 4.7, creatinine 1.0 Labs (3/13): K 3.5, creatinine 0.94 Labs (8/13): K 4.1, creatinine 1.2 Labs (3/14): K 5, creatinine 1.03  ECG: NSR, LBBB (136 msec), 1st degree AVB  PMH: 1. Nonischemic CMP: LHC (2002) with nonobstructive CAD.  Echo (12/10): EF 30-35%, diffuse hypokinesis worse in the inferior and posterior walls, mild AI, mild MR.  Echo (7/12): moderately dilated LV, EF 25%, diffuse hypokinesis, PA systolic pressure 49 mmHg.  2. H/o appendectomy 3. H/o DVT left leg, on chronic warfarin 4. IBS 5. H/o cholecystectomy 6. ERCP with sphincterotomy and stone removal 7. Prostate cancer: seed implantation.  8. Hernia repair 9. Bilateral TKR 10. Diabetes mellitus: Diet-controlled 11. Hyperlipidemia 12. Low back pain 13. LBBB 14. Bladder cancer: TURBT 3/14.   SH: Married, no children.  Quit smoking.  Rare ETOH.   FH: Father with CAD.  Current Outpatient Prescriptions  Medication Sig Dispense Refill  . carvedilol (COREG) 6.25 MG tablet Take 1 tablet (6.25 mg total) by mouth 2 (two) times daily.  180 tablet  3  . ciprofloxacin (CIPRO) 250 MG tablet Take 1 tablet (250 mg total) by mouth 2 (two) times daily.  10 tablet  0  . dicyclomine (BENTYL) 20 MG tablet Take 20 mg by mouth 3 (three) times daily as needed. irritable bowel syndrome      .  esomeprazole (NEXIUM) 40 MG capsule Take 1 capsule (40 mg total) by mouth daily before breakfast. 30 min before first meal of day.  90 capsule  3  . fish oil-omega-3 fatty acids 1000 MG capsule Take 1 g by mouth daily.      . furosemide (LASIX) 40 MG tablet Take 1 tablet (40 mg total) by mouth every morning.  90 tablet  3  . HYDROcodone-acetaminophen (NORCO/VICODIN) 5-325 MG per tablet Take 1 tablet by mouth every 8 (eight) hours as needed for pain.       Marland Kitchen losartan (COZAAR) 25 MG tablet Take 1 tablet (25 mg total) by mouth 2 (two) times daily.  180 tablet  3  . pravastatin (PRAVACHOL) 40 MG tablet Take 1 tablet (40 mg total) by mouth at bedtime.  90 tablet  3  . pregabalin (LYRICA) 75 MG capsule Take 75 mg by mouth daily.      . ranitidine (ZANTAC) 150 MG tablet Take 1 tablet (150 mg total) by mouth 2 (two) times daily as needed. Heart burn  90 tablet  3  . vitamin B-12 (CYANOCOBALAMIN) 1000 MCG tablet Take 1,000 mcg by mouth daily.      Marland Kitchen zolpidem (AMBIEN) 5 MG tablet Take 5 mg by mouth at bedtime as needed for sleep.      . [DISCONTINUED] Calcium Carbonate-Vitamin D (CALTRATE 600+D) 600-400 MG-UNIT per tablet Take 1 tablet by mouth daily.  No current facility-administered medications for this visit.    BP 132/62  Pulse 52  Ht 5\' 10"  (1.778 m)  Wt 194 lb 9.6 oz (88.27 kg)  BMI 27.92 kg/m2 General: elderly, NAD Neck: No JVD, no thyromegaly or thyroid nodule.  Lungs: Clear to auscultation bilaterally with normal respiratory effort. CV: Nondisplaced PMI.  Heart regular S1/S2, no S3/S4, 2/6 HSM at apex.  1+ ankle edema bilaterally.  No carotid bruit.  Normal pedal pulses.  Abdomen: Soft, nontender, no hepatosplenomegaly, no distention.  Neurologic: Alert and oriented x 3.  Psych: Normal affect. Extremities: No clubbing or cyanosis.   Assessment/Plan: SYSTOLIC HEART FAILURE, CHRONIC  Long history of nonischemic cardiomyopathy. Last EF 25%. NYHA class II symptoms. No JVD, volume  status looks ok today.  - Increase losartan to 50 qam, 25 qpm if BMET today looks ok.  - Mildly bradycardic, continue current Coreg.   - Continue current Lasix dosing - Given LBBB, CRT may be an option => given age would not place ICD. With current minimal exertional symptoms, would not push for CRT.  DVT  History of DVT, on chronic coumadin.   Marca Ancona 10/10/2012

## 2012-10-10 NOTE — Patient Instructions (Addendum)
Your physician recommends that you have  lab work today--BMET.  Your physician wants you to follow-up in: 4 months with Dr McLean. (July 2014). You will receive a reminder letter in the mail two months in advance. If you don't receive a letter, please call our office to schedule the follow-up appointment.   

## 2012-10-11 ENCOUNTER — Other Ambulatory Visit: Payer: Self-pay | Admitting: *Deleted

## 2012-10-11 DIAGNOSIS — I5022 Chronic systolic (congestive) heart failure: Secondary | ICD-10-CM

## 2012-10-12 NOTE — Op Note (Signed)
Preoperative diagnosis:2 cm left-sided bladder tumor  Postoperative diagnosis:same   Procedure:TURBT 2 cm bladder tumor    Surgeon: Bertram Millard. Chanee Henrickson, M.D.   Anesthesia: Gen.   Complications:none  Specimen(s):bladder tumor, to pathology  Drain(s):20 French Foley catheter  Indications:79 year old male recently found to have a left-sided bladder tumor on CT scan. Cystoscopy confirmed this. He presents at this time for TURBT and following that an overnight stay. Mitomycin administration was discussed with the patient as well.    Technique and findings:the patient was identified in the holding area and received preoperative IV antibiotics. He was taken to the operating room where general anesthetic was administered with LMA. He was placed in the dorsal lithotomy position. Genitalia and perineum were prepped and draped. Proper time out was performed. A 22 French panendoscope was then advanced and was bladder. Prostate was relatively nonobstructive. Bladder was entered and inspected circumferentially. It was a 2 cm papillary tumor on the left side wall. Ureteral orifices were normal. No other urothelial lesions were noted. I then removed the cystoscope and placed a 27 French resectoscope sheath using the obturator. The resectoscope element and cutting loop were then placed. The bladder tumor wasn't resected down to the muscular layer. The entire papillary lesion was removed. The chips were irrigated from the bladder. The resected site was free of any further papillary tumor. The resected site was then cauterized. No perforation was noted. I then placed a 20 French Foley catheter. The bladder was drained, the irrigant was clear. The patient was then administered 40 mg of mitomycin in 40 cc of diluent. The catheter was clamped/plug, and the patient was returned to the PACU, having tolerated the procedure well.

## 2012-10-14 ENCOUNTER — Ambulatory Visit (INDEPENDENT_AMBULATORY_CARE_PROVIDER_SITE_OTHER): Payer: Medicare Other | Admitting: General Practice

## 2012-10-14 DIAGNOSIS — I82409 Acute embolism and thrombosis of unspecified deep veins of unspecified lower extremity: Secondary | ICD-10-CM

## 2012-10-14 LAB — POCT INR: INR: 1.5

## 2012-10-21 ENCOUNTER — Other Ambulatory Visit (INDEPENDENT_AMBULATORY_CARE_PROVIDER_SITE_OTHER): Payer: Medicare Other

## 2012-10-21 DIAGNOSIS — I5022 Chronic systolic (congestive) heart failure: Secondary | ICD-10-CM

## 2012-10-21 LAB — BASIC METABOLIC PANEL
CO2: 27 mEq/L (ref 19–32)
Chloride: 103 mEq/L (ref 96–112)
Creatinine, Ser: 1.1 mg/dL (ref 0.4–1.5)
Glucose, Bld: 164 mg/dL — ABNORMAL HIGH (ref 70–99)
Sodium: 136 mEq/L (ref 135–145)

## 2012-10-28 ENCOUNTER — Ambulatory Visit (INDEPENDENT_AMBULATORY_CARE_PROVIDER_SITE_OTHER): Payer: Medicare Other | Admitting: General Practice

## 2012-10-28 DIAGNOSIS — I82409 Acute embolism and thrombosis of unspecified deep veins of unspecified lower extremity: Secondary | ICD-10-CM

## 2012-11-18 ENCOUNTER — Other Ambulatory Visit: Payer: Self-pay | Admitting: Pulmonary Disease

## 2012-11-23 ENCOUNTER — Ambulatory Visit (INDEPENDENT_AMBULATORY_CARE_PROVIDER_SITE_OTHER): Payer: Medicare Other | Admitting: General Practice

## 2012-11-23 DIAGNOSIS — I82409 Acute embolism and thrombosis of unspecified deep veins of unspecified lower extremity: Secondary | ICD-10-CM

## 2012-11-23 LAB — POCT INR: INR: 2.6

## 2012-12-13 ENCOUNTER — Encounter: Payer: Self-pay | Admitting: Cardiology

## 2012-12-21 ENCOUNTER — Ambulatory Visit (INDEPENDENT_AMBULATORY_CARE_PROVIDER_SITE_OTHER): Payer: Medicare Other | Admitting: Family Medicine

## 2012-12-21 DIAGNOSIS — I82409 Acute embolism and thrombosis of unspecified deep veins of unspecified lower extremity: Secondary | ICD-10-CM

## 2012-12-21 LAB — POCT INR: INR: 2.1

## 2013-01-25 ENCOUNTER — Ambulatory Visit (INDEPENDENT_AMBULATORY_CARE_PROVIDER_SITE_OTHER): Payer: Medicare Other | Admitting: General Practice

## 2013-01-25 ENCOUNTER — Telehealth: Payer: Self-pay | Admitting: *Deleted

## 2013-01-25 DIAGNOSIS — I82409 Acute embolism and thrombosis of unspecified deep veins of unspecified lower extremity: Secondary | ICD-10-CM

## 2013-01-25 MED ORDER — MELOXICAM 7.5 MG PO TABS: 7.5000 mg | ORAL_TABLET | Freq: Every day | ORAL | Status: DC

## 2013-01-25 NOTE — Telephone Encounter (Signed)
Pt walked into the office this morning and requested a refill of the mobic  7.5 mg  #90  1 tablet by mouth daily as needed for arthritis.   Pt is requesting that this rx be mailed to him to take and have filled at [facility]

## 2013-01-25 NOTE — Telephone Encounter (Signed)
rx has been printed and signed by SN and placed in the mail to the pt. Nothing further is needed.

## 2013-02-09 ENCOUNTER — Ambulatory Visit: Payer: Medicare Other | Admitting: Cardiology

## 2013-02-15 ENCOUNTER — Encounter: Payer: Self-pay | Admitting: Pulmonary Disease

## 2013-02-16 ENCOUNTER — Telehealth: Payer: Self-pay | Admitting: Pulmonary Disease

## 2013-02-16 NOTE — Telephone Encounter (Signed)
[email or web address]  

## 2013-02-16 NOTE — Telephone Encounter (Signed)
Called and spoke with pt and he stated that he had bladder surgery in march. He stated that since then he is still leaking.  Pt was given the myrbetriq to try but stated that this medication has not helped at all.  Pt has appt with SN on 8/4 and he is aware that SN is out of the office until in the morning.  SN please advise.  thanks

## 2013-02-17 NOTE — Telephone Encounter (Signed)
I called and made pt aware. Nothing further was needed 

## 2013-02-17 NOTE — Telephone Encounter (Signed)
Per SN--  There is no other medication that SN knows of.  He will need to call urology and see if they can see him sooner since he is having this problem.  thanks

## 2013-02-20 ENCOUNTER — Encounter: Payer: Self-pay | Admitting: Pulmonary Disease

## 2013-02-20 ENCOUNTER — Ambulatory Visit (INDEPENDENT_AMBULATORY_CARE_PROVIDER_SITE_OTHER): Payer: Medicare Other | Admitting: Pulmonary Disease

## 2013-02-20 VITALS — BP 124/66 | HR 55 | Temp 98.5°F | Ht 70.0 in | Wt 190.4 lb

## 2013-02-20 DIAGNOSIS — I2510 Atherosclerotic heart disease of native coronary artery without angina pectoris: Secondary | ICD-10-CM

## 2013-02-20 DIAGNOSIS — C61 Malignant neoplasm of prostate: Secondary | ICD-10-CM

## 2013-02-20 DIAGNOSIS — E785 Hyperlipidemia, unspecified: Secondary | ICD-10-CM

## 2013-02-20 DIAGNOSIS — G609 Hereditary and idiopathic neuropathy, unspecified: Secondary | ICD-10-CM

## 2013-02-20 DIAGNOSIS — M48061 Spinal stenosis, lumbar region without neurogenic claudication: Secondary | ICD-10-CM

## 2013-02-20 DIAGNOSIS — F419 Anxiety disorder, unspecified: Secondary | ICD-10-CM

## 2013-02-20 DIAGNOSIS — I428 Other cardiomyopathies: Secondary | ICD-10-CM

## 2013-02-20 DIAGNOSIS — M1990 Unspecified osteoarthritis, unspecified site: Secondary | ICD-10-CM

## 2013-02-20 DIAGNOSIS — D638 Anemia in other chronic diseases classified elsewhere: Secondary | ICD-10-CM

## 2013-02-20 DIAGNOSIS — E119 Type 2 diabetes mellitus without complications: Secondary | ICD-10-CM

## 2013-02-20 DIAGNOSIS — C679 Malignant neoplasm of bladder, unspecified: Secondary | ICD-10-CM | POA: Insufficient documentation

## 2013-02-20 DIAGNOSIS — K219 Gastro-esophageal reflux disease without esophagitis: Secondary | ICD-10-CM

## 2013-02-20 DIAGNOSIS — I82409 Acute embolism and thrombosis of unspecified deep veins of unspecified lower extremity: Secondary | ICD-10-CM

## 2013-02-20 HISTORY — DX: Anemia in other chronic diseases classified elsewhere: D63.8

## 2013-02-20 MED ORDER — DICLOFENAC SODIUM 75 MG PO TBEC: 75.0000 mg | DELAYED_RELEASE_TABLET | Freq: Two times a day (BID) | ORAL | Status: DC

## 2013-02-20 MED ORDER — HYDROCODONE-ACETAMINOPHEN 5-325 MG PO TABS: 1.0000 | ORAL_TABLET | Freq: Two times a day (BID) | ORAL | Status: DC

## 2013-02-20 NOTE — Progress Notes (Addendum)
Subjective:    Patient ID: [name] [name], male    DOB: [birth year], 79 y.o.   MRN: [id number]  HPI 79 y/o WM w/ mult med problems here for a follow up OV...    ~  August 20, 2010:  He's had a good 8mo overall- CC= arthritis & he is holding off on right TKR from DrOlin per DrBBrodie's rec against surg;  he's been taking Lyrica ~Qod for "feet burning"/ neuropathy symptoms but doesn't like it cause he thinks it's making him gain weight> we discussed change to Gabapentin 100-200-300mg  Qhs to see if this is more agreeable to him.    He notes BS at home incr to ~145 ave & we will recheck FBS & A1c to see if he needs to start meds;  he wants to be sure he gets his permission slip signed for his free diabetic shoes.    He saw DrBB 12/11 for Cards f/u> doing well w/ his non-ischemic cardiomyopathy, EF= 30-35%, & no changes made; he will be following up w/ DrMcLean; he had some leg swelling & [facility] which [facility] neg- pt on Coumadin via the CC for hx DVT.  ~  February 18, 2011:  8mo ROV & he is c/o knee pain & nocturnal leg cramps for which he takes mustard;  He didn't like the Gabapentins- states it gave him hot flashes, and he prefers the [facility]...    He saw DrMcLean for Cards f/u 6/12> non-ischemic cardiomyopathy & he is quite active w/ golfing & treadmill, no CP but c/o knee pain etc;  He started BBlocker COREG 3.125mg Bid & watching for any bradycardia (pt has already decreased this to 1/2 tab Bid "it made my head feel funny");  f/u 2DEcho showed dilated LV w/ EF=25% & diffuse HK, dil LA, triv AI noted;  EKG w/ NSR, IVCD, NSSTTWA...  ~  August 26, 2011:  8mo ROV & [name] [name] tells me he has right knee replacement surg sched for 10/06/11 w/ DrAlusio; he has been caring for wife s/p fx hip & he's having a lot of trouble getting around; he was prev rec NOT to have right TKR by DrBB in 2010>    He saw DrMcLean (Cards) 1/13 for f/u of his ischemic cardiomyopathy (Echo 7/12 w/ dilLV &  EF25%, diffuse HK)> he denies CP, palpit, ch in DOE or edema; on Coreg, Lasix, Cozaar; labs showed normal renal function & BNP=361 (Lasix incr to 40 & Cozaar decr to 25); [facility] will consult while he is in the [place]...    CXR shows mild cardiomegaly, clear lungs, NAD...    Chol stable on Prav40 (see below);  DM control still adeq on diet alone;  Hg=11.9 mild anemia...  ~  February 24, 2012:  8mo ROV & [name] [name] had his right TKR from [facility] 3/13> did well in rehab as far as his knee was concerned but then developed LBP & some leg weakness, now seeing DrRamos for this eval, given Norco which he likes better than Vicodin, & further eval w/ MRI is pending, he is currently getting some PT arranged by DrRamos;  Note> prev hx back injury yrs ago while working for [employer] w/ subseq surg...    He saw DrMcLean for Cards 5/13> nonischemic cardiomyopathy w/ dilLV & EF=25%; his Losartan 25mg  was incr to Bid; labs showed BUN=25, Creat=1.2, BNP=387 stable...    He saw DrDahlstedt, Urology 5/13> Hx prostate cancer w/ XRT 2002- prev saw DrPeterson, then  Ottelin & released; hx OB & LUTS w/ freq & urgency on Gelnique (3pumps/d) but he never mentioned this med in our discussions...    We reviewed prob list, meds, xrays and labs> see below for updates >> CXR 2/13 showed mild cardiomeg, clear lungs, NAD.Marland KitchenMarland Kitchen EKG 4/13 showed NSR w/ PVCs, LAD, poor R prog, NSSTTWA... LABS 8/13:  Chems- ok x BS=173 A1c=6.7;  CBC- ok x Hg=11.3;  TSH=1.03;  PSA=0.02  ~  August 31, 2012:  44mo ROV & [name] [name] is c/o a gastroenteritis w/ 4d diarrhea, took OTC rx & now improved- no further diarrhea in last 1-2d- but c/o vague discomfort- steady mid-abd area, cramping, nausea, no vomiting, denies heartburn, no fever or sweats "I got hot" and he notes Bentyl w/o help; we discussed CT Abd for further eval & he wants to proceed...     CAD, cardiomyopathy w/ EF=25%, LBBB> on Coreg6.25Bid, Losar25Bid, Lasix40; BP= 108/68 & he feels sl weak w/ mult somatic  complaints; followed by DrMcLean for Cards- seen 9/13 & Coreg increased to 6.25Bid...    Hx DVT on Coumadin> stable w/ protimes via the [facility]...    Chol> on Prav40; last FLP 9/13 showed TChol 160, TG 111, HDL 54, LDL 84    DM> on diet alone; labs 8/13 showed BS= 173, A1c= 6.7; repeat labs 2/14 => pending    GERD, IBS> on Nexium40, Zantac150, Bentyl20prn; c/o vague abd symptoms & we decided to check CT Abd=> pending    Prostate cancer> s/p radium seeds in 2000 & PSA has been reading zero ever since (last check 8/13= 0.02)...    DJD, knee & LBP> on Voltaren gel, Vicodin, [id number]; s/p bilat TKRs & he is c/o LBP w/ eval by DrRamos...    Anemia> on Niferex; labs 8/13 showed Hg=11.3 and repeat CBC 2/14 => pending We reviewed prob list, meds, xrays and labs> see below for updates >>  LABS 2/14:  Chems- ok x BS=112 A1c=6.7;  CBC- wnl w/ Hg=12.6 WBC=5.4;  Sed=26 CT Abd&Pelvis 2/14 => 1.8cm lesion on the left bladder wall (prob bladder cancer) & we will refer to Urology for further eval;  Incidental findings include cysts on Liver, s/p GB, divertics, atherosclerotic changes, etc...   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  We will replace the Coumadin 2mg  on the list today... He continues to fg/u w/ DrDahlstedt & his new complaint is urinary incont/ leakage, he wears pads & is pretty upset about this symptom- he is on Myrbetriq trial- he will discuss w/ urology in f/u...    He had Cards f/u DrMcLean 3/14> nonischemic cardiomyop w/ dilated LV & EF=25% and diffuse HK, nonobstructive CAD on cath 2002, on Coumadin  for hx DVT w/ protimes thru the [facility] as noted; he rec continuing Lasix, Coreg, Coumadin, & incr Losartan 50AM & 25PM We reviewed prob list, meds, xrays and labs> see below for updates >>   LABS 8/14:  Pt decided he wanted labs done now> FLP- ok on Prav40;  Chems- ok w/ BS=138 A1c=6.9 on diet alone;  CBC- ok;  TSH=2.77;  PSA=0.01    1.  Non-obstructive CAD, Cardiomyopathy, LBBB - on LOSARTAN,  LASIX, and COREG... followed by Dr Shirlee Latch for Cards... his prev Toprol was stopped secondary to bradycardia, and he has prev refused to take Vasotec, c/o dizzy from Lisinopril, & head felt funny on Altace. ~  Cath 2002 reported w/ nonobstructive CAD ~  baseline EKG= NSR, LBBB, IVCD... ~  2DEcho 11/08 showed global HK, dil LV, EF=35-40%, mild AR/ MR...  ~  labs 6/09 showed BUN=20, Creat= 1.1, K= 4.8, BNP= 57... ~  2DEcho 5/10 showed LV mod dil w/ diffuse HK & EF= 30-35%, mild MR & AI, dil atria & incr PAsys=48. ~  Pre-op eval 12/10 by BB w/ repeat echo (no change)- pt states he advised against further ortho surg. ~  6/12:  Cards eval from DrMcLean> 2DEcho w/ EF=25% & diffuse HK; COREG added to his regimen... ~  2/13:  CXR showed mild cardiomegaly, clear lungs, NAD.Marland Kitchen. ~  5/13:  He had f/u DrMcLean> nonischemic cardiomyopathy w/ dilLV & EF=25%; his Losartan 25mg  was incr to Bid; labs showed BUN=25, Creat=1.2, BNP=387 stable. ~  8/13:  BP= 120/62 & stable medically; labs remain stable... ~  2/14: on Coreg6.25Bid, Losar25Bid, Lasix40; BP= 108/68 & he feels sl weak w/ mult somatic complaints; followed by DrMcLean for Cards- seen 9/13 & Coreg increased to 6.25Bid... ~  CXR 3/14 showed some hyperinflation & scarring at bases, no change from prev XRays/ NAD... ~  3/14: pt seen by DrMcLean and Losartan incr to 50mg Qam & 25mg Qpm (other meds same)...  2.  Hx DVT - he continues on Coumadin and followed in the coumadin clinic...  3.  DM - he's on diet alone... he reports recent incr in his BS ave at home to  ~145 range. ~  labs 2/08 showed BS= 128, HgA1c = 6.3 ~  labs 6/09 showed BS= 118, HgA1c= 6.3 ~  he saw DrGroat 2/10 for eye check- no retinopathy... ~  labs 8/10 showed BS= 109, A1c= 6.3 ~  labs 8/11 showed BS= 112, A1c=6.7 ~  labs 2/12 showed BS= 116, A1c= 6.7.Marland KitchenMarland Kitchen OK to continue diet Rx. ~  Labs 2/13 on diet alone showed BS= 127, A1c= 6.7.Marland KitchenMarland Kitchen Discussed low carb diet. ~  Labs 8/13 showed BS= 173, A1c= 6.7.Marland KitchenMarland Kitchen Needs better low carb diet... ~  Labs 2/14 showed BS= 112, A1c= 6.7 ~  Labs 8/14 done at pt's request showed BS=138, A1c= 6.9  4.  Chol - on PRAVASTATIN 40 and tolerating this well... prev had trouble w/ Zocor- increased LFTs. ~  FLP 5/08 showed TChol 212, TG 77, HDL 75, LDL 108 ~  FLP 6/09 showed TChol 180, TG 80, HDL 54, LDL 110 ~  FLP 2/10 showed TChol 226, TG 43, HDL 65, LDL 148... he declined to incr the Prav to 80. ~  FLP 8/10 showed TChol 187, TG 87, HDL 52, LDL 87 ~  FLP 8/11 showed TChol 176, TG 87, HDL 56, LDL 102 ~  FLP 2/12 showed TChol 157, TG 82, HDL 55, LDL 86... Continue same. ~  FLP 2/13 on Prav40 showed TChol 146, TG 53, HDL 58, LDL 78 ~  FLP 9/13 on Prav40 showed TChol 160, TG 111, HDL 54, LDL 84 ~  FLP 8/14 on Prav40 showed TChol 157, TG 129, HDL 48, LDL 83  5.  DJD/ KNEE Arthritis/ LBP - he had a Left TKR by DrOlin 8/08... he went to the [facility] post op to recoup, then back home (& required  EUA w/ manipulation to lyse adhesions)> after that his wife fell and broke her hip...  now having incr difficulty w/ right knee and had shots from Ortho... he takes VICODIN Tid Prn,  MOBIC 7.5mg  Prn, along w/ Calcium, MVI, & Vit D... he's been on LYRICA 75mg  Prn (ave Qod) for neuropathy in feet> he was intol to Gabapentin w/ "hot flashes" ~  DrOlin planned right TKR 1/11- pre-op eval from Cards & pt reports they advised against surg by DrBB therefore surg not done. ~  2/12:  c/o weight gain on Lyrica (~Qod) for neuropathy; rec ch to Gabapentin 100>200>300mg  Qhs as trial, but  subseq intol as above. ~  3/13:  He had Right TKR by DrAlusio  & post op rehab at [facility]; knee improved but c/o LBP worse since knee surg & being evaluated by DrRamos... ~  8/13:  He reports being changed from Vicodin to [facility] 5/325 which he likes better; on-going LBP eval from DrRamos w/ MRI pending...  6. GERD &  IBS - on NEXIUM 40mg /d, & Zantac 150mg HS, Bentyl 20mg Tid Prn which really helps his IBS symptoms. ~  2/10:  c/o incr reflux & peptic symptoms... advised start PREVACID 30mg /d in AM & take Zantac HS...  ~  Subseq switched to NEXIUM 40mg /d + ZANTAC 150mg  HS... ~  2/14: presented w/ vague abd symptoms- ?gastroenteritis; labs ok & CT Abd => pending  7.  PROSTATE CANCER >> followed by DrPeterson/ Ottelin/ Dahlstedt & treated w/ radium seeds/ XRT in 2000... 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 ~  labs 2/10 showed PSA= 0.02 ~  labs 8/11 showed PSA= 0.02 ~  Labs 8/12 showed PSA= 0.02 ~  Labs 8/13 showed PSA= 0.02 ~  Labs 8/14 showed PSA= 0/01 ~  [name] [name] continues f/u for both cancers w/ DrDahlstedt, Urology...  8.  ANEMIA >> he has a borderline anemia & takes MVI... ~  Labs 8/10 showed Hg= 13.1, MCV= 95 ~  Labs 6/11 showed Hg= 12.4, MCV= 94 ~  Labs 8/12 showed Hg= 12.4, MCV= 97 ~  Labs 2/13 showed Hg= 11.9, MCV= 97 ~  [facility] showed Hg down to 9.2=> start FeSO4... ~  Labs 8/13 showed Hg= 11.3.Marland KitchenMarland Kitchen Continue Niferex. ~  Labs 2/14 showed Hg= 12.6 on Niferex one daily... ~  Labs 8/14 showed Hg= 12.5  9.  SHINGLES? - he states that he "caught" shingles from a guy he was sitting next to at a restaurant & broke out on his leg 2-3d later... he went to see Derm, DrLeschin & treated...   Past Surgical History  Procedure Laterality Date  . Appendectomy    . Knee arthroscopy      L knee. 1991 by Dr. Criss Alvine  . Ercp  with sphincterotomy and stone removal      5/93 by Dr. Russella Dar  . Bilat inguinal hernia repair      1998 by Dr. Samuella Cota  . Radium seed implantation for prostate cancer      s/p. 1/00 by Dr. Vonita Moss and Dan Humphreys  . R cataract surgery      2004 by Dr. Elmer Picker  . Repair of right recurrent right inguinal hernia      12/07 by Dr. Lurene Shadow  . Basal cell cancer      L ear. s/p Moh's surgery 3/09 Dr. Alean Rinne  . Total knee arthroplasty  Left. 8/08 by Dr. Charlann Boxer  . Tonsillectomy  09-28-11    child  . Cholecystectomy      1985 by Dr. Julien Girt  . Total knee arthroplasty  10/05/2011    Procedure: TOTAL KNEE ARTHROPLASTY;  Surgeon: Loanne Drilling, MD;  Location: [facility];  Service: Orthopedics;  Laterality: Right;  . Transurethral resection of bladder tumor N/A 09/30/2012    Procedure: TRANSURETHRAL RESECTION OF BLADDER TUMOR (TURBT);  Surgeon: Marcine Matar, MD;  Location: [facility];  Service: Urology;  Laterality: N/A;  WITH MITOMYCIN INSTILLATION     Outpatient Encounter Prescriptions as of 02/20/2013  Medication Sig Dispense Refill  . carvedilol (COREG) 6.25 MG tablet Take 1 tablet (6.25 mg total) by mouth 2 (two) times daily.  180 tablet  3  . dicyclomine (BENTYL) 20 MG tablet Take 20 mg by mouth 3 (three) times daily as needed. irritable bowel syndrome      . esomeprazole (NEXIUM) 40 MG capsule Take 1 capsule (40 mg total) by mouth daily before breakfast. 30 min before first meal of day.  90 capsule  3  . fish oil-omega-3 fatty acids 1000 MG capsule Take 1 g by mouth daily.      . furosemide (LASIX) 40 MG tablet Take 1 tablet (40 mg total) by mouth every morning.  90 tablet  3  . losartan (COZAAR) 25 MG tablet 2 tablets (total 50mg ) in the AM and 1 tablet in the PM      . LYRICA 75 MG capsule TAKE 1 CAPSULE EVERY DAY  30 capsule  5  . mirabegron ER (MYRBETRIQ) 50 MG TB24 tablet Take 50 mg by mouth daily.      . pravastatin (PRAVACHOL) 40 MG tablet Take 1 tablet (40 mg total) by mouth at bedtime.   90 tablet  3  . ranitidine (ZANTAC) 150 MG tablet Take 1 tablet (150 mg total) by mouth 2 (two) times daily as needed. Heart burn  90 tablet  3  . vitamin B-12 (CYANOCOBALAMIN) 1000 MCG tablet Take 1,000 mcg by mouth daily.      Marland Kitchen zolpidem (AMBIEN) 5 MG tablet Take 5 mg by mouth at bedtime as needed for sleep.      Marland Kitchen HYDROcodone-acetaminophen (NORCO/VICODIN) 5-325 MG per tablet Take 1 tablet by mouth every 8 (eight) hours as needed for pain.       . meloxicam (MOBIC) 7.5 MG tablet Take 1 tablet (7.5 mg total) by mouth daily.  90 tablet  3  . [DISCONTINUED] ciprofloxacin (CIPRO) 250 MG tablet Take 1 tablet (250 mg total) by mouth 2 (two) times daily.  10 tablet  0   No facility-administered encounter medications on file as of 02/20/2013.    No Known Allergies   Current Medications, Allergies, Past Medical History, Past Surgical History, Family History, and Social History were reviewed in [employer] record.    Review of Systems    See HPI - all other systems neg except as noted...  The patient complains of dyspnea on exertion.  The patient denies anorexia, fever, weight loss, weight gain, vision loss, decreased hearing, hoarseness, chest pain, syncope, peripheral edema, prolonged cough, headaches, hemoptysis, abdominal pain, melena, hematochezia, severe indigestion/heartburn, hematuria, incontinence, muscle weakness, suspicious skin lesions, transient blindness, difficulty walking, depression, unusual weight change, abnormal bleeding, enlarged lymph nodes, and angioedema.     Objective:   Physical Exam    WD, WN, 87 WM in NAD... GENERAL:  Alert & oriented; pleasant & cooperative... HEENT:  [place]/AT,  EOM-wnl, PERRLA, EACs-clear, TMs-wnl, NOSE-clear, THROAT-clear & wnl. NECK:  Supple w/ fair ROM; no JVD; normal carotid impulses w/o bruits; no thyromegaly or nodules palpated; no lymphadenopathy. CHEST:  Clear to P & A; without wheezes/ rales/ or rhonchi. HEART:  Regular  Rhythm;  Gr 1/6 SEM w/o rubs or gallops. ABDOMEN:  Soft & nontender; normal bowel sounds; no organomegaly or masses detected. EXT:  s/p left TKR, mod-severe arthritic changes; mild venous insuffic changes, no signif edema. NEURO:  CN's intact; motor testing normal; sensory testing equivocal;  gait abnormal, balance fair. DERM:  No lesions noted; no rash etc...  RADIOLOGY DATA:  Reviewed in the EPIC EMR & discussed w/ the patient...  LABORATORY DATA:  Reviewed in the EPIC EMR & discussed w/ the patient...   Assessment & Plan:    Non-obstructive CAD, Cardiomyopathy w/ EF=25%, LBBB>  Followed by [facility] & his note reviewed; Meds adjusted by Cards...  Hx DVT>  He remains on Coumadin via the CC; they will help coordinate perioperative management...  CHOL>  On Prav40 & tolerating this reasonably well...  DM>  Controlled on diet alone & reminded to avoid sugars & sweets...  GI> GERD, IBS>  Recent gastroenteriitis & vague symptoms> w/u in progress- CBC is wnl, Sed=26, CT Abd=> pending  Prostate Cancer & Bladder Cancer>  followed by DrDahlstadt & stable as above; CT Abd & Pelvis 2/14 w/ 1.8cm lesion on left bladder wall=> s/p TURBT w/ Mitomycin etc...  DJD>  His operative risk is elev given his age, comorbid conditions, cardiomyopathy; he is willing to accept these risks given the severity of his arthritis & approaching inability to walk;  Continue Mobic, Vicodin, Lyrica, etc...   Patient's Medications  New Prescriptions   DICLOFENAC (VOLTAREN) 75 MG EC TABLET    Take 1 tablet (75 mg total) by mouth 2 (two) times daily.  Previous Medications   CARVEDILOL (COREG) 6.25 MG TABLET    Take 1 tablet (6.25 mg total) by mouth 2 (two) times daily.   DICYCLOMINE (BENTYL) 20 MG TABLET    Take 20 mg by mouth 3 (three) times daily as needed. irritable bowel syndrome   ESOMEPRAZOLE (NEXIUM) 40 MG CAPSULE    Take 1 capsule (40 mg total) by mouth daily before breakfast. 30 min before first meal of day.    FISH OIL-OMEGA-3 FATTY ACIDS 1000 MG CAPSULE    Take 1 g by mouth daily.   FUROSEMIDE (LASIX) 40 MG TABLET    Take 1 tablet (40 mg total) by mouth every morning.   LOSARTAN (COZAAR) 25 MG TABLET    2 tablets (total 50mg ) in the AM and 1 tablet in the PM   LYRICA 75 MG CAPSULE    TAKE 1 CAPSULE EVERY DAY   MIRABEGRON ER (MYRBETRIQ) 50 MG TB24 TABLET    Take 50 mg by mouth daily.   PRAVASTATIN (PRAVACHOL) 40 MG TABLET    Take 1 tablet (40 mg total) by mouth at bedtime.   RANITIDINE (ZANTAC) 150 MG TABLET    Take 1 tablet (150 mg total) by mouth 2 (two) times daily as needed. Heart burn   VITAMIN B-12 (CYANOCOBALAMIN) 1000 MCG TABLET    Take 1,000 mcg by mouth daily.   ZOLPIDEM (AMBIEN) 5 MG TABLET    Take 5 mg by mouth at bedtime as needed for sleep.  Modified Medications   Modified Medication Previous Medication   HYDROCODONE-ACETAMINOPHEN (NORCO/VICODIN) 5-325 MG PER TABLET HYDROcodone-acetaminophen (NORCO/VICODIN) 5-325 MG per tablet  Take 1 tablet by mouth 2 (two) times daily.    Take 1 tablet by mouth every 8 (eight) hours as needed for pain.   Discontinued Medications   CIPROFLOXACIN (CIPRO) 250 MG TABLET    Take 1 tablet (250 mg total) by mouth 2 (two) times daily.   MELOXICAM (MOBIC) 7.5 MG TABLET    Take 1 tablet (7.5 mg total) by mouth daily.

## 2013-02-20 NOTE — Patient Instructions (Addendum)
Today we updated your med list in our EPIC system...    Continue your current medications the same...  We changed the Mobic to VOLTAREN 75mg  one tab up to twice daily as needed for arthritis pain...  We refilled your Vicodin per request...  Please return to your FASTING blood work...    We will contact you w/ the results when available...   Call for any questions...  Let's plan a follow up visit in 58mo, sooner if needed for problems.Marland KitchenMarland Kitchen

## 2013-02-22 ENCOUNTER — Telehealth: Payer: Self-pay | Admitting: Pulmonary Disease

## 2013-02-22 NOTE — Telephone Encounter (Signed)
Order has been placed for the pt and nothing further is needed 

## 2013-02-23 ENCOUNTER — Other Ambulatory Visit (INDEPENDENT_AMBULATORY_CARE_PROVIDER_SITE_OTHER): Payer: Medicare Other

## 2013-02-23 DIAGNOSIS — M48061 Spinal stenosis, lumbar region without neurogenic claudication: Secondary | ICD-10-CM

## 2013-02-23 DIAGNOSIS — F419 Anxiety disorder, unspecified: Secondary | ICD-10-CM

## 2013-02-23 DIAGNOSIS — C61 Malignant neoplasm of prostate: Secondary | ICD-10-CM

## 2013-02-23 DIAGNOSIS — F411 Generalized anxiety disorder: Secondary | ICD-10-CM

## 2013-02-23 DIAGNOSIS — G609 Hereditary and idiopathic neuropathy, unspecified: Secondary | ICD-10-CM

## 2013-02-23 DIAGNOSIS — D638 Anemia in other chronic diseases classified elsewhere: Secondary | ICD-10-CM

## 2013-02-23 DIAGNOSIS — I82409 Acute embolism and thrombosis of unspecified deep veins of unspecified lower extremity: Secondary | ICD-10-CM

## 2013-02-23 DIAGNOSIS — K219 Gastro-esophageal reflux disease without esophagitis: Secondary | ICD-10-CM

## 2013-02-23 DIAGNOSIS — I428 Other cardiomyopathies: Secondary | ICD-10-CM

## 2013-02-23 DIAGNOSIS — M1990 Unspecified osteoarthritis, unspecified site: Secondary | ICD-10-CM

## 2013-02-23 DIAGNOSIS — I2510 Atherosclerotic heart disease of native coronary artery without angina pectoris: Secondary | ICD-10-CM

## 2013-02-23 DIAGNOSIS — C679 Malignant neoplasm of bladder, unspecified: Secondary | ICD-10-CM

## 2013-02-23 DIAGNOSIS — E119 Type 2 diabetes mellitus without complications: Secondary | ICD-10-CM

## 2013-02-23 DIAGNOSIS — E785 Hyperlipidemia, unspecified: Secondary | ICD-10-CM

## 2013-02-23 LAB — HEPATIC FUNCTION PANEL
AST: 20 U/L (ref 0–37)
Alkaline Phosphatase: 67 U/L (ref 39–117)
Bilirubin, Direct: 0.1 mg/dL (ref 0.0–0.3)

## 2013-02-23 LAB — CBC WITH DIFFERENTIAL/PLATELET
Basophils Absolute: 0 10*3/uL (ref 0.0–0.1)
Eosinophils Absolute: 0.2 10*3/uL (ref 0.0–0.7)
HCT: 37.4 % — ABNORMAL LOW (ref 39.0–52.0)
Hemoglobin: 12.5 g/dL — ABNORMAL LOW (ref 13.0–17.0)
Lymphs Abs: 1.9 10*3/uL (ref 0.7–4.0)
MCHC: 33.3 g/dL (ref 30.0–36.0)
MCV: 95.5 fl (ref 78.0–100.0)
Monocytes Absolute: 0.4 10*3/uL (ref 0.1–1.0)
Neutro Abs: 2.4 10*3/uL (ref 1.4–7.7)
Platelets: 199 10*3/uL (ref 150.0–400.0)
RDW: 13.7 % (ref 11.5–14.6)

## 2013-02-23 LAB — BASIC METABOLIC PANEL
Calcium: 10 mg/dL (ref 8.4–10.5)
GFR: 63.17 mL/min (ref >60.00)
Potassium: 5.1 mEq/L (ref 3.5–5.1)
Sodium: 141 mEq/L (ref 135–145)

## 2013-02-23 LAB — LIPID PANEL
HDL: 48.1 mg/dL (ref >39.00)
Total CHOL/HDL Ratio: 3
VLDL: 25.8 mg/dL (ref 0.0–40.0)

## 2013-03-01 ENCOUNTER — Ambulatory Visit: Payer: Medicare Other | Admitting: Pulmonary Disease

## 2013-03-08 ENCOUNTER — Ambulatory Visit (INDEPENDENT_AMBULATORY_CARE_PROVIDER_SITE_OTHER): Payer: Medicare Other | Admitting: Family Medicine

## 2013-03-08 DIAGNOSIS — I82409 Acute embolism and thrombosis of unspecified deep veins of unspecified lower extremity: Secondary | ICD-10-CM

## 2013-03-08 LAB — POCT INR: INR: 2.4

## 2013-03-29 ENCOUNTER — Telehealth: Payer: Self-pay | Admitting: Pulmonary Disease

## 2013-03-29 DIAGNOSIS — R109 Unspecified abdominal pain: Secondary | ICD-10-CM

## 2013-03-29 NOTE — Telephone Encounter (Signed)
LMOMTCB x 1 

## 2013-03-29 NOTE — Telephone Encounter (Signed)
Per SN---  Had ct in 08/2012 Seen by GI in 2007--Dr. Jarold Motto and Dr. Christella Hartigan F/u colon was recommeneded but he never did it.  SN recs:  miralax 1 capful in water daily Senokot S 2 po qhs High fiber diet Take the bentyl 20 mg  TID  Follow up with Dr. Jarold Motto or PA.

## 2013-03-29 NOTE — Telephone Encounter (Signed)
Spoke with pt in lobby.  He c/o ongoing stomach pain for several months.  Sees Dr Retta Diones for bladder cancer and pt reports that last bladder scan was clear.  Pt also c/o nausea and occas constipation.  Denies vomiting or diarrhea.  Pt states that he hasnt had a colonoscopy recently and wanders if he needs one.  [facility].  Please advise

## 2013-03-30 NOTE — Telephone Encounter (Signed)
Spoke with pt's wife.  Informed her of below recs per Dr. Kriste Basque.  She verbalized understanding of this.  I have placed order to have [facility] set pt up appt with Dr. Eloise Harman or PA.  Wife aware she will receive call regarding this appt.  She is aware to call back if pt's symptoms do not improve or worsen and to seek emergency care if needed.  She verbalized understanding of all instructions and voiced no further questions or concerns at this time.

## 2013-03-31 ENCOUNTER — Encounter: Payer: Self-pay | Admitting: Gastroenterology

## 2013-04-05 ENCOUNTER — Other Ambulatory Visit: Payer: Self-pay | Admitting: Cardiology

## 2013-04-13 ENCOUNTER — Ambulatory Visit (INDEPENDENT_AMBULATORY_CARE_PROVIDER_SITE_OTHER): Payer: Medicare Other | Admitting: Cardiology

## 2013-04-13 ENCOUNTER — Encounter: Payer: Self-pay | Admitting: Cardiology

## 2013-04-13 VITALS — BP 132/76 | HR 70 | Ht 70.0 in | Wt 187.0 lb

## 2013-04-13 DIAGNOSIS — I5022 Chronic systolic (congestive) heart failure: Secondary | ICD-10-CM

## 2013-04-13 DIAGNOSIS — I2510 Atherosclerotic heart disease of native coronary artery without angina pectoris: Secondary | ICD-10-CM

## 2013-04-13 DIAGNOSIS — I82409 Acute embolism and thrombosis of unspecified deep veins of unspecified lower extremity: Secondary | ICD-10-CM

## 2013-04-13 MED ORDER — CARVEDILOL 6.25 MG PO TABS: 9.7500 mg | ORAL_TABLET | Freq: Two times a day (BID) | ORAL | Status: DC

## 2013-04-13 NOTE — Patient Instructions (Addendum)
Your physician has recommended you make the following change in your medication: Increase Carvedilol to  1 & 1/2 tablets twice a day  Your physician recommends that you schedule a follow-up appointment in: 4 months with Dr. Shirlee Latch

## 2013-04-15 NOTE — Progress Notes (Signed)
Patient ID: [name] [name], male   DOB: [birth year], 80 y.o.   MRN: [id number] PCP: Dr. Kriste Basque  79 yo with history of nonischemic cardiomyopathy presents for cardiology followup.  Last echo was in 7/12 with moderately dilated LV and EF 25%.  He is still golfing 1-2 times a week when the weather allows. He denies exertional dyspnea, orthopnea, or PND.  No chest pain.  Weight is down 7 lbs since last appointment.  He was diagnosed with bladder cancer this year and had TURBT in 3/14.  Main complaint is incontinence since the TURBT.  Labs (2/12): K 4.4, creatinine 1.0, LDL 86, HDL 55 Labs (8/12): K 5.3, creatinine 1.1, LDL 98, HDL 57 Labs (9/12): BNP 206 Labs (10/12): K 4.7, creatinine 1.0 Labs (3/13): K 3.5, creatinine 0.94 Labs (8/13): K 4.1, creatinine 1.2 Labs (3/14): K 5, creatinine 1.03 Labs (8/14): LDL 83, K 5.1, creatinine 1.2  ECG: NSR, LBBB (136 msec), 1st degree AVB  PMH: 1. Nonischemic CMP: LHC (2002) with nonobstructive CAD.  Echo (12/10): EF 30-35%, diffuse hypokinesis worse in the inferior and posterior walls, mild AI, mild MR.  Echo (7/12): moderately dilated LV, EF 25%, diffuse hypokinesis, PA systolic pressure 49 mmHg.  2. H/o appendectomy 3. H/o DVT left leg, on chronic warfarin 4. IBS 5. H/o cholecystectomy 6. ERCP with sphincterotomy and stone removal 7. Prostate cancer: seed implantation.  8. Hernia repair 9. Bilateral TKR 10. Diabetes mellitus: Diet-controlled 11. Hyperlipidemia 12. Low back pain 13. LBBB 14. Bladder cancer: TURBT 3/14.   SH: Married, no children.  Quit smoking.  Rare ETOH.   FH: Father with CAD.  ROS: All systems reviewed and negative except as per HPI.   Current Outpatient Prescriptions  Medication Sig Dispense Refill  . carvedilol (COREG) 6.25 MG tablet Take 1.5 tablets (9.375 mg total) by mouth 2 (two) times daily.  135 tablet  3  . diclofenac (VOLTAREN) 75 MG EC tablet Take 1 tablet (75 mg total) by mouth 2 (two) times daily.  180 tablet  3   . dicyclomine (BENTYL) 20 MG tablet Take 20 mg by mouth 3 (three) times daily as needed. irritable bowel syndrome      . esomeprazole (NEXIUM) 40 MG capsule Take 1 capsule (40 mg total) by mouth daily before breakfast. 30 min before first meal of day.  90 capsule  3  . fish oil-omega-3 fatty acids 1000 MG capsule Take 1 g by mouth daily.      . furosemide (LASIX) 40 MG tablet TAKE 1 TABLET BY MOUTH ONCE EVERY MORNING  30 tablet  4  . HYDROcodone-acetaminophen (NORCO/VICODIN) 5-325 MG per tablet Take 1 tablet by mouth 2 (two) times daily.  180 tablet  3  . losartan (COZAAR) 25 MG tablet 2 tablets (total 50mg ) in the AM and 1 tablet in the PM      . LYRICA 75 MG capsule TAKE 1 CAPSULE EVERY DAY  30 capsule  5  . mirabegron ER (MYRBETRIQ) 50 MG TB24 tablet Take 50 mg by mouth daily.      . pravastatin (PRAVACHOL) 40 MG tablet Take 1 tablet (40 mg total) by mouth at bedtime.  90 tablet  3  . ranitidine (ZANTAC) 150 MG tablet Take 1 tablet (150 mg total) by mouth 2 (two) times daily as needed. Heart burn  90 tablet  3  . vitamin B-12 (CYANOCOBALAMIN) 1000 MCG tablet Take 1,000 mcg by mouth daily.      Marland Kitchen warfarin (COUMADIN) 2 MG tablet  Take as directed by the coumadin clinic      . zolpidem (AMBIEN) 5 MG tablet Take 5 mg by mouth at bedtime as needed for sleep.      . [DISCONTINUED] Calcium Carbonate-Vitamin D (CALTRATE 600+D) 600-400 MG-UNIT per tablet Take 1 tablet by mouth daily.        No current facility-administered medications for this visit.    BP 132/76  Pulse 70  Ht 5\' 10"  (1.778 m)  Wt 84.823 kg (187 lb)  BMI 26.83 kg/m2 General: elderly, NAD Neck: No JVD, no thyromegaly or thyroid nodule.  Lungs: Clear to auscultation bilaterally with normal respiratory effort. CV: Nondisplaced PMI.  Heart regular S1/S2, no S3/S4, 2/6 HSM at apex.  1+ ankle edema bilaterally.  No carotid bruit.  Normal pedal pulses.  Abdomen: Soft, nontender, no hepatosplenomegaly, no distention.  Neurologic:  Alert and oriented x 3.  Psych: Normal affect. Extremities: No clubbing or cyanosis.   Assessment/Plan: SYSTOLIC HEART FAILURE, CHRONIC  Long history of nonischemic cardiomyopathy. Last EF 25%. NYHA class II symptoms. No JVD, volume status looks ok today.  - Continue losartan.  - Increase Coreg to 9.375 mg bid.  - Continue current Lasix dosing.  K was high normal, would cut back on bananas (eats a lot).  - Given LBBB, CRT may be an option => given age would not place ICD. With current minimal exertional symptoms, would not push for CRT.  DVT  History of DVT, on chronic coumadin.   Marca Ancona 04/15/2013

## 2013-04-18 ENCOUNTER — Telehealth: Payer: Self-pay | Admitting: Cardiology

## 2013-04-18 NOTE — Telephone Encounter (Signed)
New Problem  Medication inquiry/// states he was here last week and a sleep aid medication was not called in/// transferred pt to medications department.

## 2013-04-19 ENCOUNTER — Ambulatory Visit (INDEPENDENT_AMBULATORY_CARE_PROVIDER_SITE_OTHER): Payer: Medicare Other | Admitting: General Practice

## 2013-04-19 ENCOUNTER — Encounter: Payer: Self-pay | Admitting: *Deleted

## 2013-04-19 DIAGNOSIS — I82409 Acute embolism and thrombosis of unspecified deep veins of unspecified lower extremity: Secondary | ICD-10-CM

## 2013-04-19 LAB — POCT INR: INR: 2.4

## 2013-04-28 ENCOUNTER — Other Ambulatory Visit (INDEPENDENT_AMBULATORY_CARE_PROVIDER_SITE_OTHER): Payer: Medicare Other

## 2013-04-28 ENCOUNTER — Encounter: Payer: Self-pay | Admitting: Gastroenterology

## 2013-04-28 ENCOUNTER — Ambulatory Visit (INDEPENDENT_AMBULATORY_CARE_PROVIDER_SITE_OTHER): Payer: Medicare Other | Admitting: Gastroenterology

## 2013-04-28 VITALS — BP 124/80 | HR 74 | Ht 70.0 in | Wt 184.4 lb

## 2013-04-28 DIAGNOSIS — Z9089 Acquired absence of other organs: Secondary | ICD-10-CM

## 2013-04-28 DIAGNOSIS — R109 Unspecified abdominal pain: Secondary | ICD-10-CM

## 2013-04-28 DIAGNOSIS — K8050 Calculus of bile duct without cholangitis or cholecystitis without obstruction: Secondary | ICD-10-CM

## 2013-04-28 DIAGNOSIS — K5900 Constipation, unspecified: Secondary | ICD-10-CM

## 2013-04-28 DIAGNOSIS — K861 Other chronic pancreatitis: Secondary | ICD-10-CM

## 2013-04-28 DIAGNOSIS — Z9049 Acquired absence of other specified parts of digestive tract: Secondary | ICD-10-CM

## 2013-04-28 DIAGNOSIS — Z7901 Long term (current) use of anticoagulants: Secondary | ICD-10-CM

## 2013-04-28 DIAGNOSIS — K219 Gastro-esophageal reflux disease without esophagitis: Secondary | ICD-10-CM

## 2013-04-28 LAB — CBC WITH DIFFERENTIAL/PLATELET
Basophils Relative: 0.6 % (ref 0.0–3.0)
Eosinophils Absolute: 0.2 10*3/uL (ref 0.0–0.7)
Eosinophils Relative: 2.9 % (ref 0.0–5.0)
HCT: 35.4 % — ABNORMAL LOW (ref 39.0–52.0)
Hemoglobin: 12.1 g/dL — ABNORMAL LOW (ref 13.0–17.0)
MCHC: 34.3 g/dL (ref 30.0–36.0)
MCV: 92.7 fl (ref 78.0–100.0)
Monocytes Absolute: 0.3 10*3/uL (ref 0.1–1.0)
Neutro Abs: 3.5 10*3/uL (ref 1.4–7.7)
Neutrophils Relative %: 62.7 % (ref 43.0–77.0)
Platelets: 224 10*3/uL (ref 150.0–400.0)
WBC: 5.6 10*3/uL (ref 4.5–10.5)

## 2013-04-28 LAB — AMYLASE: Amylase: 61 U/L (ref 27–131)

## 2013-04-28 LAB — HEPATIC FUNCTION PANEL
ALT: 11 U/L (ref 0–53)
AST: 23 U/L (ref 0–37)
Bilirubin, Direct: 0.1 mg/dL (ref 0.0–0.3)
Total Bilirubin: 0.6 mg/dL (ref 0.3–1.2)

## 2013-04-28 LAB — BASIC METABOLIC PANEL
Calcium: 9.7 mg/dL (ref 8.4–10.5)
Chloride: 101 mEq/L (ref 96–112)
Potassium: 4.2 mEq/L (ref 3.5–5.1)

## 2013-04-28 LAB — TSH: TSH: 2.17 u[IU]/mL (ref 0.35–5.50)

## 2013-04-28 MED ORDER — PANCRELIPASE (LIP-PROT-AMYL) 25000 UNITS PO CPEP: 2.0000 | ORAL_CAPSULE | Freq: Three times a day (TID) | ORAL | Status: DC

## 2013-04-28 NOTE — Progress Notes (Signed)
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  He also has chronic congestive heart failure essential hypertension and coronary artery disease.Marland Kitchen  He has a history of cholecystectomy many years ago that has been bothered on 2 occasions with pain, and bile duct stones or prior sphincterotomy and stone removal, last performed by Dr. Christella Hartigan in October 2007.  There was some concern that he may have a biliary colonic fistula, but this was not proven radiographically.  He now complains of several months of continuous vague epigastric discomfort with no real precipitating or alleviating elements.  He denies fever, chills, nausea vomiting, dyspepsia, reflux symptoms, melena or hematochezia.  He does have mild functional constipation, and apparently had surgery in March for bladder carcinoma and is incontinent of urine.  His had previous surgery for prostate cancer.  He is followed closely by Dr. Kriste Basque in primary care and also by cardiology.  He does not abuse NSAIDs, and is on Nexium 40 mg a day and Zantac at bedtime.  Review of his CT scan from February shows persistently dilated common bile duct and intrahepatic ducts with pneumobilia associated with  2 sphincterotomies.  He also has calcification in his pancreas and pancreatic atrophy unchanged from 2007.  Also noted was diverticulosis and evidence of a bladder cancer which is been resected.  He has had a 5 pound weight loss since surgery in March.  He denies a specific food intolerances.  I have reviewed this patient's present history, medical and surgical past history, allergies and medications.     ROS:   All systems were reviewed and are negative unless otherwise stated in the HPI.  He denies use of heavy ethanol intake, smoking, COPD.  He apparently exercises on his treadmill daily.    Physical Exam: Surprisingly healthy-appearing patient in no acute distress appearing younger  than his stated age.  Blood pressure 124/80, pulse 74 and regular and weight 184 pounds. General well developed well nourished patient in no acute distress, appearing their stated age Eyes PERRLA, no icterus, fundoscopic exam per opthamologist Skin no lesions noted Neck supple, no adenopathy, no thyroid enlargement, no tenderness Chest clear to percussion and auscultation Heart no significant murmurs, gallops or rubs noted Abdomen no hepatosplenomegaly masses or tenderness, BS normal.  Rectal inspection normal no fissures, or fistulae noted.  No masses or tenderness on digital exam. Stool guaiac negative.  Well-healed midline abdominal epigastric scar noted. Extremities no acute joint lesions, edema, phlebitis or evidence of cellulitis. Neurologic patient oriented x 3, cranial nerves intact, no focal neurologic deficits noted. Psychological mental status normal and normal affect.  Assessment and plan: Recurrent abdominal painin an elderly patient on Coumadin for chronic DVT's who has had a history of previous cholecystectomy for cholelithiasis, and has had 2 sphincterotomies with common bile duct stone extractions.  Review of his x-rays from February show evidence of chronic pancreatic atrophy and calcification consistent with chronic pancreatitis.  We need to be sure that he does not have another retained common bile duct stone, and I've ordered liver function tests, amylase, lipase, and we'll repeat his MRCP exam.  I placed him on Zen- pep 2 tablets by mouth 3 times a day with meals, Chronulac at bedtime, we'll continue his PPI medication, and we will proceed accordingly.  He may need followup ERCP exam with Dr. Russella Dar or Dr. Christella Hartigan who have previously preformed these exams.  There is no evidence of jaundice,  chronic liver disease or toxicity at this time.  I do not think he needs repeat colonoscopy exam, he has known diverticulosis with a negative exam within the last 10 years.  I've asked him to  continue other medications as per primary care and cardiology.

## 2013-04-28 NOTE — Addendum Note (Signed)
Addended by: Ok Anis A on: 04/28/2013 09:48 AM   Modules accepted: Orders

## 2013-04-28 NOTE — Patient Instructions (Addendum)
You have been scheduled for an MRI at [facility] on 05-04-2013. Your appointment time is 1:00 pm. Please arrive 15 minutes prior to your appointment time for registration purposes. There is no prep for this test. However, if you have any metal in your body, have a pacemaker or defibrillator, please be sure to let your ordering physician know. This test typically takes 45 minutes to 1 hour to complete.  Your physician has requested that you go to the basement for the following lab work before leaving today: CBC TSH Liver Function Panel BMP Sedimentation Rate  Lipase Amylase  Please do Miralax at bedtime. Mix a capful in either juice or water and drink  Samples of Zenpep given today, please take two tablets three times daily

## 2013-05-04 ENCOUNTER — Ambulatory Visit (HOSPITAL_COMMUNITY)
Admission: RE | Admit: 2013-05-04 | Discharge: 2013-05-04 | Disposition: A | Discharge status: Home / Self Care | Payer: Medicare Other | Source: Ambulatory Visit | Attending: Gastroenterology | Admitting: Gastroenterology

## 2013-05-04 ENCOUNTER — Other Ambulatory Visit: Payer: Self-pay | Admitting: Gastroenterology

## 2013-05-04 DIAGNOSIS — I700 Atherosclerosis of aorta: Secondary | ICD-10-CM | POA: Insufficient documentation

## 2013-05-04 DIAGNOSIS — R109 Unspecified abdominal pain: Secondary | ICD-10-CM | POA: Insufficient documentation

## 2013-05-04 DIAGNOSIS — Z8546 Personal history of malignant neoplasm of prostate: Secondary | ICD-10-CM | POA: Insufficient documentation

## 2013-05-04 DIAGNOSIS — K838 Other specified diseases of biliary tract: Secondary | ICD-10-CM | POA: Insufficient documentation

## 2013-05-04 DIAGNOSIS — Z9089 Acquired absence of other organs: Secondary | ICD-10-CM | POA: Insufficient documentation

## 2013-05-04 DIAGNOSIS — K7689 Other specified diseases of liver: Secondary | ICD-10-CM | POA: Insufficient documentation

## 2013-05-04 MED ORDER — GADOBENATE DIMEGLUMINE 529 MG/ML IV SOLN
17.0000 mL | Freq: Once | INTRAVENOUS | Status: AC | PRN
  Administered 2013-05-04: 17 mL via INTRAVENOUS

## 2013-05-09 ENCOUNTER — Encounter: Payer: Self-pay | Admitting: Gastroenterology

## 2013-05-17 ENCOUNTER — Telehealth: Payer: Self-pay | Admitting: Gastroenterology

## 2013-05-17 NOTE — Telephone Encounter (Signed)
Informed pt's wife of results; had done that earlier. Wife wrote down the results.

## 2013-05-19 ENCOUNTER — Other Ambulatory Visit: Payer: Self-pay | Admitting: Pulmonary Disease

## 2013-05-22 ENCOUNTER — Other Ambulatory Visit: Payer: Self-pay | Admitting: Pulmonary Disease

## 2013-05-24 ENCOUNTER — Telehealth: Payer: Self-pay | Admitting: Pulmonary Disease

## 2013-05-24 NOTE — Telephone Encounter (Signed)
Called, spoke with pt.  Pt states he will be going to [facility] this week to gets meds.  He will need rxs for Lasix and Pravastatin (90 days supply) to take with him.  He would like to pick rxs up tomorrow.  Lasix 40 mg qd rx was last given to pt by Dr. Alford Highland office # [id number]  Is Dr. Kriste Basque ok with giving pt rx for this now?  Please advise.  Thank you.  Also, pt was referred to Dr. Jarold Motto.  Pt was seen by Dr. Jarold Motto on 04/28/13.  Reports he is still having abd pain and would like to know if Dr. Kriste Basque has reviewed the information from Dr. Jarold Motto.  Please advise.  Thank you.  Last OV with SN: 02/20/13; asked to f/u in 6 months

## 2013-05-25 MED ORDER — FUROSEMIDE 40 MG PO TABS: ORAL_TABLET | ORAL | Status: DC

## 2013-05-25 MED ORDER — PRAVASTATIN SODIUM 40 MG PO TABS: 40.0000 mg | ORAL_TABLET | Freq: Every day | ORAL | Status: DC

## 2013-05-25 NOTE — Telephone Encounter (Signed)
Pt is aware. RX'S placed upfront for pick for pt

## 2013-05-25 NOTE — Telephone Encounter (Signed)
Per SN---  Not yet, SN has been swamped. Please follow up with his specialist for on going symptoms.  Ok to refill the lasix and pravastatin.

## 2013-05-31 ENCOUNTER — Telehealth: Payer: Self-pay | Admitting: Gastroenterology

## 2013-05-31 ENCOUNTER — Ambulatory Visit (INDEPENDENT_AMBULATORY_CARE_PROVIDER_SITE_OTHER): Payer: Medicare Other | Admitting: General Practice

## 2013-05-31 DIAGNOSIS — I82409 Acute embolism and thrombosis of unspecified deep veins of unspecified lower extremity: Secondary | ICD-10-CM

## 2013-05-31 LAB — POCT INR: INR: 2.4

## 2013-05-31 MED ORDER — PANCRELIPASE (LIP-PROT-AMYL) 25000 UNITS PO CPEP: 2.0000 | ORAL_CAPSULE | Freq: Three times a day (TID) | ORAL | Status: DC

## 2013-05-31 NOTE — Progress Notes (Signed)
Pre-visit discussion using our clinic review tool. No additional management support is needed unless otherwise documented below in the visit note.  

## 2013-05-31 NOTE — Telephone Encounter (Signed)
Went to talk to patient about samples, patient was complaining of abd pain. Gave message to [employer]

## 2013-05-31 NOTE — Telephone Encounter (Signed)
Pt walked in complaining no one ever called him back about his MRI. Explained to pt I called his wife and she stated she would tell him. Read the report to him and he stated understanding, but is still c/o lower abdominal pain. Explained to him we did an [facility] looking at his bile ducts, not his lower abdomen. He reports he had a bladder polyp removed earlier and still has dripping and incontinence. Asked him to call his urologist about that. He states they gave him pills, Zenpep at his last visit. I asked if they helped and he stated yes. Gave him more samples and a script to take to the [place]. Read Dr Norval Gable ofc note to him. He states he is constipated and I stated he is to take the Miralax every night at bedtime; now he admits he takes every other day. Gave pt instructions in a letter to take the Miralax QHS, take the Zenpep script to the [place] and if his abdominal pain continues, call us back. Pt stated understanding. abd pain was addressed by [facility] in August, 2014 and again, constipation was thought to be the source. Dr Jarold Motto, any advice? Thanks.

## 2013-06-12 ENCOUNTER — Telehealth: Payer: Self-pay | Admitting: Gastroenterology

## 2013-06-12 NOTE — Telephone Encounter (Signed)
Ok to use?

## 2013-06-12 NOTE — Telephone Encounter (Signed)
I called pharmacy back and advised per Dr. Jarold Motto patient can have Creon 24000-2 capsules three times daily

## 2013-06-12 NOTE — Telephone Encounter (Signed)
[facility] Creon 24000 We can call back and change over the phone Can we change?

## 2013-07-07 ENCOUNTER — Ambulatory Visit (INDEPENDENT_AMBULATORY_CARE_PROVIDER_SITE_OTHER): Payer: Medicare Other | Admitting: General Practice

## 2013-07-07 DIAGNOSIS — I82409 Acute embolism and thrombosis of unspecified deep veins of unspecified lower extremity: Secondary | ICD-10-CM

## 2013-07-07 LAB — POCT INR: INR: 2.3

## 2013-07-07 NOTE — Progress Notes (Signed)
Pre-visit discussion using our clinic review tool. No additional management support is needed unless otherwise documented below in the visit note.  

## 2013-08-04 ENCOUNTER — Ambulatory Visit: Payer: Medicare Other | Admitting: Cardiology

## 2013-08-17 ENCOUNTER — Other Ambulatory Visit: Payer: Self-pay | Admitting: Cardiology

## 2013-08-18 ENCOUNTER — Ambulatory Visit (INDEPENDENT_AMBULATORY_CARE_PROVIDER_SITE_OTHER): Payer: Medicare Other | Admitting: General Practice

## 2013-08-18 DIAGNOSIS — I82409 Acute embolism and thrombosis of unspecified deep veins of unspecified lower extremity: Secondary | ICD-10-CM

## 2013-08-18 DIAGNOSIS — Z5181 Encounter for therapeutic drug level monitoring: Secondary | ICD-10-CM

## 2013-08-18 LAB — POCT INR: INR: 1.6

## 2013-08-18 NOTE — Progress Notes (Signed)
Pre-visit discussion using our clinic review tool. No additional management support is needed unless otherwise documented below in the visit note.  

## 2013-08-23 ENCOUNTER — Ambulatory Visit: Payer: Medicare Other | Admitting: Pulmonary Disease

## 2013-08-24 ENCOUNTER — Ambulatory Visit: Payer: Medicare Other | Admitting: Cardiology

## 2013-09-07 ENCOUNTER — Telehealth: Payer: Self-pay | Admitting: Pulmonary Disease

## 2013-09-07 MED ORDER — WARFARIN SODIUM 2 MG PO TABS
ORAL_TABLET | ORAL | Status: DC
Start: 2013-09-07 — End: 2013-09-21

## 2013-09-07 MED ORDER — WARFARIN SODIUM 2 MG PO TABS: ORAL_TABLET | ORAL | Status: DC

## 2013-09-07 MED ORDER — RANITIDINE HCL 150 MG PO TABS: 150.0000 mg | ORAL_TABLET | Freq: Two times a day (BID) | ORAL | Status: DC | PRN

## 2013-09-07 MED ORDER — PRAVASTATIN SODIUM 40 MG PO TABS: 40.0000 mg | ORAL_TABLET | Freq: Every day | ORAL | Status: DC

## 2013-09-07 NOTE — Telephone Encounter (Signed)
[facility] in [facility] and have cancelled these Spoke with the pt  He is req to pick up rxs tomorrow so that she can take these to the [place] on SN's cart to sign

## 2013-09-07 NOTE — Telephone Encounter (Signed)
LMTCB

## 2013-09-15 ENCOUNTER — Ambulatory Visit (INDEPENDENT_AMBULATORY_CARE_PROVIDER_SITE_OTHER): Payer: Medicare Other | Admitting: General Practice

## 2013-09-15 DIAGNOSIS — Z5181 Encounter for therapeutic drug level monitoring: Secondary | ICD-10-CM

## 2013-09-15 DIAGNOSIS — I82409 Acute embolism and thrombosis of unspecified deep veins of unspecified lower extremity: Secondary | ICD-10-CM

## 2013-09-15 LAB — POCT INR: INR: 2.5

## 2013-09-15 NOTE — Progress Notes (Signed)
Pre visit review using our clinic review tool, if applicable. No additional management support is needed unless otherwise documented below in the visit note. 

## 2013-09-20 ENCOUNTER — Encounter: Payer: Self-pay | Admitting: Cardiology

## 2013-09-20 ENCOUNTER — Ambulatory Visit (INDEPENDENT_AMBULATORY_CARE_PROVIDER_SITE_OTHER): Payer: Medicare Other | Admitting: Cardiology

## 2013-09-20 VITALS — BP 130/70 | HR 59 | Ht 70.0 in | Wt 184.8 lb

## 2013-09-20 DIAGNOSIS — I82409 Acute embolism and thrombosis of unspecified deep veins of unspecified lower extremity: Secondary | ICD-10-CM

## 2013-09-20 DIAGNOSIS — I2510 Atherosclerotic heart disease of native coronary artery without angina pectoris: Secondary | ICD-10-CM

## 2013-09-20 DIAGNOSIS — I5022 Chronic systolic (congestive) heart failure: Secondary | ICD-10-CM

## 2013-09-20 LAB — BASIC METABOLIC PANEL
BUN: 22 mg/dL (ref 6–23)
CO2: 30 mEq/L (ref 19–32)
Calcium: 9.4 mg/dL (ref 8.4–10.5)
Chloride: 102 mEq/L (ref 96–112)
Creatinine, Ser: 1.1 mg/dL (ref 0.4–1.5)
GFR: 65.7 mL/min (ref >60.00)
Glucose, Bld: 153 mg/dL — ABNORMAL HIGH (ref 70–99)
POTASSIUM: 4 meq/L (ref 3.5–5.1)
SODIUM: 138 meq/L (ref 135–145)

## 2013-09-20 MED ORDER — ZOLPIDEM TARTRATE 5 MG PO TABS: ORAL_TABLET | ORAL | Status: DC

## 2013-09-20 MED ORDER — CARVEDILOL 12.5 MG PO TABS: 12.5000 mg | ORAL_TABLET | Freq: Two times a day (BID) | ORAL | Status: DC

## 2013-09-20 NOTE — Patient Instructions (Signed)
Increase coreg(carvedilol) to 12.5mg  two times a day. You can take 2 of your 6.25mg  tablets two times a day and use your current supp;y.  Your physician recommends that you have lab work today--BMET.  Your physician recommends that you schedule a follow-up appointment in: 4 months with Dr Aundra Dubin.

## 2013-09-21 ENCOUNTER — Encounter (HOSPITAL_COMMUNITY): Payer: Self-pay | Admitting: Emergency Medicine

## 2013-09-21 ENCOUNTER — Emergency Department (HOSPITAL_COMMUNITY)
Admission: EM | Admit: 2013-09-21 | Discharge: 2013-09-21 | Disposition: A | Discharge status: Home / Self Care | Payer: Medicare Other | Attending: Emergency Medicine | Admitting: Emergency Medicine

## 2013-09-21 DIAGNOSIS — Z87448 Personal history of other diseases of urinary system: Secondary | ICD-10-CM | POA: Insufficient documentation

## 2013-09-21 DIAGNOSIS — Z85828 Personal history of other malignant neoplasm of skin: Secondary | ICD-10-CM | POA: Insufficient documentation

## 2013-09-21 DIAGNOSIS — Z87891 Personal history of nicotine dependence: Secondary | ICD-10-CM | POA: Insufficient documentation

## 2013-09-21 DIAGNOSIS — Z860100 Personal history of colon polyps, unspecified: Secondary | ICD-10-CM | POA: Insufficient documentation

## 2013-09-21 DIAGNOSIS — E119 Type 2 diabetes mellitus without complications: Secondary | ICD-10-CM | POA: Insufficient documentation

## 2013-09-21 DIAGNOSIS — I2510 Atherosclerotic heart disease of native coronary artery without angina pectoris: Secondary | ICD-10-CM | POA: Insufficient documentation

## 2013-09-21 DIAGNOSIS — Z7901 Long term (current) use of anticoagulants: Secondary | ICD-10-CM | POA: Insufficient documentation

## 2013-09-21 DIAGNOSIS — Z79899 Other long term (current) drug therapy: Secondary | ICD-10-CM | POA: Insufficient documentation

## 2013-09-21 DIAGNOSIS — Y929 Unspecified place or not applicable: Secondary | ICD-10-CM | POA: Insufficient documentation

## 2013-09-21 DIAGNOSIS — M1990 Unspecified osteoarthritis, unspecified site: Secondary | ICD-10-CM | POA: Insufficient documentation

## 2013-09-21 DIAGNOSIS — Z86718 Personal history of other venous thrombosis and embolism: Secondary | ICD-10-CM | POA: Insufficient documentation

## 2013-09-21 DIAGNOSIS — Z8669 Personal history of other diseases of the nervous system and sense organs: Secondary | ICD-10-CM | POA: Insufficient documentation

## 2013-09-21 DIAGNOSIS — E785 Hyperlipidemia, unspecified: Secondary | ICD-10-CM | POA: Insufficient documentation

## 2013-09-21 DIAGNOSIS — Y939 Activity, unspecified: Secondary | ICD-10-CM | POA: Insufficient documentation

## 2013-09-21 DIAGNOSIS — Z791 Long term (current) use of non-steroidal anti-inflammatories (NSAID): Secondary | ICD-10-CM | POA: Insufficient documentation

## 2013-09-21 DIAGNOSIS — K219 Gastro-esophageal reflux disease without esophagitis: Secondary | ICD-10-CM | POA: Insufficient documentation

## 2013-09-21 DIAGNOSIS — I10 Essential (primary) hypertension: Secondary | ICD-10-CM | POA: Insufficient documentation

## 2013-09-21 DIAGNOSIS — S0191XA Laceration without foreign body of unspecified part of head, initial encounter: Secondary | ICD-10-CM

## 2013-09-21 DIAGNOSIS — Z923 Personal history of irradiation: Secondary | ICD-10-CM | POA: Insufficient documentation

## 2013-09-21 DIAGNOSIS — Z8546 Personal history of malignant neoplasm of prostate: Secondary | ICD-10-CM | POA: Insufficient documentation

## 2013-09-21 DIAGNOSIS — S0180XA Unspecified open wound of other part of head, initial encounter: Secondary | ICD-10-CM | POA: Insufficient documentation

## 2013-09-21 DIAGNOSIS — IMO0002 Reserved for concepts with insufficient information to code with codable children: Secondary | ICD-10-CM | POA: Insufficient documentation

## 2013-09-21 NOTE — Discharge Instructions (Signed)
Laceration Care, Adult °A laceration is a cut or lesion that goes through all layers of the skin and into the tissue just beneath the skin. °TREATMENT  °Some lacerations may not require closure. Some lacerations may not be able to be closed due to an increased risk of infection. It is important to see your caregiver as soon as possible after an injury to minimize the risk of infection and maximize the opportunity for successful closure. °If closure is appropriate, pain medicines may be given, if needed. The wound will be cleaned to help prevent infection. Your caregiver will use stitches (sutures), staples, wound glue (adhesive), or skin adhesive strips to repair the laceration. These tools bring the skin edges together to allow for faster healing and a better cosmetic outcome. However, all wounds will heal with a scar. Once the wound has healed, scarring can be minimized by covering the wound with sunscreen during the day for 1 full year. °HOME CARE INSTRUCTIONS  °For sutures or staples: °· Keep the wound clean and dry. °· If you were given a bandage (dressing), you should change it at least once a day. Also, change the dressing if it becomes wet or dirty, or as directed by your caregiver. °· Wash the wound with soap and water 2 times a day. Rinse the wound off with water to remove all soap. Pat the wound dry with a clean towel. °· After cleaning, apply a thin layer of the antibiotic ointment as recommended by your caregiver. This will help prevent infection and keep the dressing from sticking. °· You may shower as usual after the first 24 hours. Do not soak the wound in water until the sutures are removed. °· Only take over-the-counter or prescription medicines for pain, discomfort, or fever as directed by your caregiver. °· Get your sutures or staples removed as directed by your caregiver. °For skin adhesive strips: °· Keep the wound clean and dry. °· Do not get the skin adhesive strips wet. You may bathe  carefully, using caution to keep the wound dry. °· If the wound gets wet, pat it dry with a clean towel. °· Skin adhesive strips will fall off on their own. You may trim the strips as the wound heals. Do not remove skin adhesive strips that are still stuck to the wound. They will fall off in time. °For wound adhesive: °· You may briefly wet your wound in the shower or bath. Do not soak or scrub the wound. Do not swim. Avoid periods of heavy perspiration until the skin adhesive has fallen off on its own. After showering or bathing, gently pat the wound dry with a clean towel. °· Do not apply liquid medicine, cream medicine, or ointment medicine to your wound while the skin adhesive is in place. This may loosen the film before your wound is healed. °· If a dressing is placed over the wound, be careful not to apply tape directly over the skin adhesive. This may cause the adhesive to be pulled off before the wound is healed. °· Avoid prolonged exposure to sunlight or tanning lamps while the skin adhesive is in place. Exposure to ultraviolet light in the first year will darken the scar. °· The skin adhesive will usually remain in place for 5 to 10 days, then naturally fall off the skin. Do not pick at the adhesive film. °You may need a tetanus shot if: °· You cannot remember when you had your last tetanus shot. °· You have never had a tetanus   shot. If you get a tetanus shot, your arm may swell, get red, and feel warm to the touch. This is common and not a problem. If you need a tetanus shot and you choose not to have one, there is a rare chance of getting tetanus. Sickness from tetanus can be serious. SEEK MEDICAL CARE IF:   You have redness, swelling, or increasing pain in the wound.  You see a red line that goes away from the wound.  You have yellowish-white fluid (pus) coming from the wound.  You have a fever.  You notice a bad smell coming from the wound or dressing.  Your wound breaks open before or  after sutures have been removed.  You notice something coming out of the wound such as wood or glass.  Your wound is on your hand or foot and you cannot move a finger or toe. SEEK IMMEDIATE MEDICAL CARE IF:   Your pain is not controlled with prescribed medicine.  You have severe swelling around the wound causing pain and numbness or a change in color in your arm, hand, leg, or foot.  Your wound splits open and starts bleeding.  You have worsening numbness, weakness, or loss of function of any joint around or beyond the wound.  You develop painful lumps near the wound or on the skin anywhere on your body. MAKE SURE YOU:   Understand these instructions.  Will watch your condition.  Will get help right away if you are not doing well or get worse. Document Released: 07/06/2005 Document Revised: 09/28/2011 Document Reviewed: 12/30/2010 [facility] Patient Information 2014 [place], [place].   Return in 7-10 days for suture removal Ibuprofen for pain or discomfort

## 2013-09-21 NOTE — Progress Notes (Signed)
Patient ID: [name] [name], male   DOB: 25-May-1923, 80 y.o.   MRN: [id number] PCP: Dr. Lenna Gilford  80 yo with history of nonischemic cardiomyopathy presents for cardiology followup.  Last echo was in 7/12 with moderately dilated LV and EF 25%.  He is still golfing 1-2 times a week when the weather allows. He walks for 15 minutes on his treadmill on most days.  He denies exertional dyspnea, orthopnea, or PND.  No chest pain.  Weight is down 3 lbs since last appointment.  Main complaint is incontinence since TURBT for bladder cancer.   Labs (2/12): K 4.4, creatinine 1.0, LDL 86, HDL 55 Labs (8/12): K 5.3, creatinine 1.1, LDL 98, HDL 57 Labs (9/12): BNP 206 Labs (10/12): K 4.7, creatinine 1.0 Labs (3/13): K 3.5, creatinine 0.94 Labs (8/13): K 4.1, creatinine 1.2 Labs (3/14): K 5, creatinine 1.03 Labs (8/14): LDL 83, K 5.1, creatinine 1.2 Labs (10/14): K 4.2, creatinine 1.2  ECG: NSR, IVCD, 1st degree AVB  PMH: 1. Nonischemic CMP: LHC (2002) with nonobstructive CAD.  Echo (12/10): EF 30-35%, diffuse hypokinesis worse in the inferior and posterior walls, mild AI, mild MR.  Echo (7/12): moderately dilated LV, EF 25%, diffuse hypokinesis, PA systolic pressure 49 mmHg.  2. H/o appendectomy 3. H/o DVT left leg, on chronic warfarin 4. IBS 5. H/o cholecystectomy 6. ERCP with sphincterotomy and stone removal 7. Prostate cancer: seed implantation.  8. Hernia repair 9. Bilateral TKR 10. Diabetes mellitus: Diet-controlled 11. Hyperlipidemia 12. Low back pain 13. LBBB/IVCD 14. Bladder cancer: TURBT 3/14.   SH: Married, no children.  Quit smoking.  Rare ETOH.   FH: Father with CAD.  ROS: All systems reviewed and negative except as per HPI.   Current Outpatient Prescriptions  Medication Sig Dispense Refill  . diclofenac (VOLTAREN) 75 MG EC tablet Take 1 tablet (75 mg total) by mouth 2 (two) times daily.  180 tablet  3  . dicyclomine (BENTYL) 20 MG tablet Take 20 mg by mouth 3 (three) times daily as  needed. irritable bowel syndrome      . esomeprazole (NEXIUM) 40 MG capsule Take 1 capsule (40 mg total) by mouth daily before breakfast. 30 min before first meal of day.  90 capsule  3  . fish oil-omega-3 fatty acids 1000 MG capsule Take 1 g by mouth daily.      . furosemide (LASIX) 40 MG tablet TAKE 1 TABLET BY MOUTH ONCE EVERY MORNING  90 tablet  3  . furosemide (LASIX) 40 MG tablet TAKE 1 TABLET BY MOUTH EVERY MORNING  30 tablet  1  . HYDROcodone-acetaminophen (NORCO/VICODIN) 5-325 MG per tablet Take 1 tablet by mouth 2 (two) times daily.  180 tablet  3  . losartan (COZAAR) 25 MG tablet 2 tablets (total 50mg ) in the AM and 1 tablet in the PM      . LYRICA 75 MG capsule TAKE 1 CAPSULE DAILY  30 capsule  5  . meloxicam (MOBIC) 7.5 MG tablet daily.      . mirabegron ER (MYRBETRIQ) 50 MG TB24 tablet Take 50 mg by mouth daily.      . Pancrelipase, Lip-Prot-Amyl, (ZENPEP) 25000 UNITS CPEP Take 2 capsules by mouth 3 (three) times daily.  180 capsule  6  . pravastatin (PRAVACHOL) 40 MG tablet Take 1 tablet (40 mg total) by mouth at bedtime.  90 tablet  0  . pravastatin (PRAVACHOL) 40 MG tablet Take 1 tablet (40 mg total) by mouth at bedtime.  90 tablet  1  . ranitidine (ZANTAC) 150 MG tablet Take 1 tablet (150 mg total) by mouth 2 (two) times daily as needed. Heart burn  180 tablet  1  . vitamin B-12 (CYANOCOBALAMIN) 1000 MCG tablet Take 1,000 mcg by mouth daily.      Marland Kitchen warfarin (COUMADIN) 2 MG tablet Take as directed by the coumadin clinic  180 tablet  0  . warfarin (COUMADIN) 2 MG tablet Take as directed by the coumadin clinic  180 tablet  1  . zolpidem (AMBIEN) 5 MG tablet TAKE 1 TABLET AT BEDTIME AS NEEDED  90 tablet  0  . carvedilol (COREG) 12.5 MG tablet Take 1 tablet (12.5 mg total) by mouth 2 (two) times daily.  180 tablet  3  . [DISCONTINUED] Calcium Carbonate-Vitamin D (CALTRATE 600+D) 600-400 MG-UNIT per tablet Take 1 tablet by mouth daily.        No current facility-administered  medications for this visit.    BP 130/70  Pulse 59  Ht 5\' 10"  (1.778 m)  Wt 83.825 kg (184 lb 12.8 oz)  BMI 26.52 kg/m2 General: elderly, NAD Neck: No JVD, no thyromegaly or thyroid nodule.  Lungs: Clear to auscultation bilaterally with normal respiratory effort. CV: Nondisplaced PMI.  Heart regular S1/S2, no S3/S4, 2/6 HSM at apex.  Trace ankle edema bilaterally.  No carotid bruit.  Normal pedal pulses.  Abdomen: Soft, nontender, no hepatosplenomegaly, no distention.  Neurologic: Alert and oriented x 3.  Psych: Normal affect. Extremities: No clubbing or cyanosis.   Assessment/Plan: SYSTOLIC HEART FAILURE, CHRONIC  Long history of nonischemic cardiomyopathy. Last EF 25%. NYHA class II symptoms. No JVD, volume status looks ok today.  - Continue losartan.  - Increase Coreg to 12.5 mg bid.  - Continue current Lasix dosing, check BMET.  - No ICD given age. DVT  History of DVT, on chronic coumadin.   Loralie Champagne 09/21/2013

## 2013-09-21 NOTE — ED Provider Notes (Signed)
CSN: [id number]     Arrival date & time 09/21/13  1521 History  This chart was scribed for non-physician practitioner Elisha Headland, NP working with Tanna Furry, MD by Zettie Pho, ED Scribe. This patient was seen in room [place] and the patient's care was started at 6:20 PM.    Chief Complaint  Patient presents with  . Head Laceration   The history is provided by the patient. No language interpreter was used.   HPI Comments: PAARTH CROPPER is a 80 y.o. male who presents to the Emergency Department complaining of a laceration to the middle of the forehead, just between the eyes, that he sustained about 3-4 hours ago when he states that part of his exercise machine hit the area. He reports an associated, mild, stinging pain to the area. A bandage was applied to the area PTA and the bleeding is well-controlled at this time. He does not remember when his last tetanus vaccination was. Patient takes 2 mg coumadin daily. He denies syncope, visual disturbance, headache, gait problem. Patient has a history of coronary atherosclerosis, LBBB, DVT, HTN, hyperlipidemia, nonischemic cardiomyopathy, coronary disease, type II DM, and prostate cancer.   Past Medical History  Diagnosis Date  . Coronary atherosclerosis of unspecified type of vessel, native or graft   . Other primary cardiomyopathies   . Other left bundle branch block   . DVT (deep venous thrombosis)     Hx of it. -7 yrs ago  . Other and unspecified hyperlipidemia   . Type II or unspecified type diabetes mellitus without mention of complication, not stated as uncontrolled   . Diverticulosis of colon (without mention of hemorrhage)   . Irritable bowel syndrome   . Personal history of malignant neoplasm of prostate   . DJD (degenerative joint disease)   . Spinal stenosis, lumbar region, without neurogenic claudication   . Unspecified hereditary and idiopathic peripheral neuropathy   . Nonischemic cardiomyopathy     EF 35-40%  . Coronary disease      nonobstructive coronary disease at catheterzation 2002.   Marland Kitchen DVT (deep venous thrombosis)     Hx of it on Coumadin therapy  . Sinus bradycardia     requiring discontinuation of Toprol   . Hypertension   . History of skin cancer   . Esophageal reflux     OCCASIONAL  . Bladder tumor   . Difficulty sleeping   . Prostate cancer     s/p radiation  . Unspecified hemorrhoids without mention of complication 6433  . Colon polyp 2001    tubular adenoma    Past Surgical History  Procedure Laterality Date  . Appendectomy    . Knee arthroscopy      L knee. 1991 by Dr. Jenean Lindau  . Ercp with sphincterotomy and stone removal      5/93 by Dr. Fuller Plan  . Bilat inguinal hernia repair      1998 by Dr. March Rummage  . Radium seed implantation for prostate cancer      s/p. 1/00 by Dr. Terance Hart and Danny Lawless  . R cataract surgery      2004 by Dr. Herbert Deaner  . Repair of right recurrent right inguinal hernia      12/07 by Dr. Bubba Camp  . Basal cell cancer      L ear. s/p Moh's surgery 3/09 Dr. Harvel Quale  . Total knee arthroplasty      Left. 8/08 by Dr. Alvan Dame  . Tonsillectomy  09-28-11    child  .  Cholecystectomy      1985 by Dr. Laurin Coder  . Total knee arthroplasty  10/05/2011    Procedure: TOTAL KNEE ARTHROPLASTY;  Surgeon: Gearlean Alf, MD;  Location: [facility];  Service: Orthopedics;  Laterality: Right;  . Transurethral resection of bladder tumor N/A 09/30/2012    Procedure: TRANSURETHRAL RESECTION OF BLADDER TUMOR (TURBT);  Surgeon: Franchot Gallo, MD;  Location: [facility];  Service: Urology;  Laterality: N/A;  WITH MITOMYCIN INSTILLATION    Family History  Problem Relation Age of Onset  . Throat cancer Brother   . Coronary artery disease Father     deceased   History  Substance Use Topics  . Smoking status: Former Smoker    Quit date: 07/20/1985  . Smokeless tobacco: Never Used  . Alcohol Use: No    Review of Systems  Eyes: Negative for visual disturbance.  Musculoskeletal: Negative for gait  problem.  Skin: Positive for wound (laceration).  Neurological: Negative for syncope and headaches.  All other systems reviewed and are negative.    Allergies  Review of patient's allergies indicates no known allergies.  Home Medications   Current Outpatient Rx  Name  Route  Sig  Dispense  Refill  . carvedilol (COREG) 12.5 MG tablet   Oral   Take 1 tablet (12.5 mg total) by mouth 2 (two) times daily.   180 tablet   3   . diclofenac (VOLTAREN) 75 MG EC tablet   Oral   Take 1 tablet (75 mg total) by mouth 2 (two) times daily.   180 tablet   3   . dicyclomine (BENTYL) 20 MG tablet   Oral   Take 20 mg by mouth 3 (three) times daily as needed. irritable bowel syndrome         . esomeprazole (NEXIUM) 40 MG capsule   Oral   Take 1 capsule (40 mg total) by mouth daily before breakfast. 30 min before first meal of day.   90 capsule   3   . fish oil-omega-3 fatty acids 1000 MG capsule   Oral   Take 1 g by mouth daily.         . pravastatin (PRAVACHOL) 40 MG tablet   Oral   Take 1 tablet (40 mg total) by mouth at bedtime.   90 tablet   1   . ranitidine (ZANTAC) 150 MG tablet   Oral   Take 1 tablet (150 mg total) by mouth 2 (two) times daily as needed. Heart burn   180 tablet   1   . warfarin (COUMADIN) 2 MG tablet      Take as directed by the coumadin clinic   180 tablet   1   . zolpidem (AMBIEN) 5 MG tablet      TAKE 1 TABLET AT BEDTIME AS NEEDED   90 tablet   0    Triage Vitals: BP 114/62  Pulse 57  Temp(Src) 98.4 F (36.9 C) (Oral)  Resp 18  Ht 5\' 10"  (1.778 m)  Wt 184 lb (83.462 kg)  BMI 26.40 kg/m2  SpO2 98%  Physical Exam  Nursing note and vitals reviewed. Constitutional: He is oriented to person, place, and time. He appears well-developed and well-nourished. No distress.  HENT:  Head: Normocephalic.  3 cm laceration to the middle of the forehead, just between the eyes. Minimal active bleeding.   Eyes: Conjunctivae are normal.  Neck:  Normal range of motion. Neck supple.  Cardiovascular: Normal rate, regular rhythm and normal heart sounds.  Pulmonary/Chest: Effort normal and breath sounds normal. No respiratory distress.  Abdominal: He exhibits no distension.  Musculoskeletal: Normal range of motion.  Neurological: He is alert and oriented to person, place, and time.  Skin: Skin is warm and dry.  Psychiatric: He has a normal mood and affect. His behavior is normal.    ED Course  Procedures (including critical care time)  DIAGNOSTIC STUDIES: Oxygen Saturation is 98% on room air, normal by my interpretation.    COORDINATION OF CARE: 6:23 PM- Will order a tetanus vaccination. Discussed that the laceration will need to be repaired with sutures. Discussed treatment plan with patient at bedside and patient verbalized agreement.   LACERATION REPAIR PROCEDURE NOTE The patient's identification was confirmed and consent was obtained. This procedure was performed by Elisha Headland, NP at 6:50 PM. Site: mid-forehead Sterile procedures observed Anesthetic used (type and amt): 2% lidocaine with epinephrine, 3 cc Suture type/size: 5.0 prolene  Length: 3 cm # of Sutures: 5 Technique: simple interrupted Complexity: simple Antibx ointment applied Tetanus ordered Site anesthetized, irrigated with NS, explored without evidence of foreign body, wound well approximated, site covered with dry, sterile dressing.  Patient tolerated procedure well without complications. Instructions for care discussed verbally and patient provided with additional written instructions for homecare and f/u.   Labs Review Labs Reviewed - No data to display Imaging Review No results found.   EKG Interpretation None      MDM   Final diagnoses:  Laceration of head   Small laceration repair, no compilcations. Wound care discussed with pt and he agrees. Return in 7 days for suture removal.   I personally performed the services described in  this documentation, which was scribed in my presence. The recorded information has been reviewed and is accurate.     Elisha Headland, NP 09/28/13 2256

## 2013-09-21 NOTE — ED Notes (Signed)
Per pt states exercise machine hit him in head-no LOC-slight head pain

## 2013-09-21 NOTE — ED Provider Notes (Signed)
Patient seen and evaluated. Minimal transfer of force. Concerned about intracranial bleeding. Excellent repair of his laceration. He appropriate for discharge without further studies.  Tanna Furry, MD 09/21/13 [place]

## 2013-09-30 NOTE — ED Provider Notes (Signed)
Medical screening examination/treatment/procedure(s) were performed by non-physician practitioner and as supervising physician I was immediately available for consultation/collaboration.   EKG Interpretation None        Tanna Furry, MD 09/30/13 [phone]

## 2013-10-11 ENCOUNTER — Ambulatory Visit (INDEPENDENT_AMBULATORY_CARE_PROVIDER_SITE_OTHER): Payer: Medicare Other | Admitting: Pulmonary Disease

## 2013-10-11 ENCOUNTER — Encounter: Payer: Self-pay | Admitting: Pulmonary Disease

## 2013-10-11 ENCOUNTER — Ambulatory Visit (INDEPENDENT_AMBULATORY_CARE_PROVIDER_SITE_OTHER): Payer: Medicare Other | Admitting: General Practice

## 2013-10-11 VITALS — BP 124/72 | HR 56 | Temp 98.5°F | Ht 70.0 in | Wt 187.2 lb

## 2013-10-11 DIAGNOSIS — M1990 Unspecified osteoarthritis, unspecified site: Secondary | ICD-10-CM

## 2013-10-11 DIAGNOSIS — I2510 Atherosclerotic heart disease of native coronary artery without angina pectoris: Secondary | ICD-10-CM

## 2013-10-11 DIAGNOSIS — E785 Hyperlipidemia, unspecified: Secondary | ICD-10-CM

## 2013-10-11 DIAGNOSIS — Z8546 Personal history of malignant neoplasm of prostate: Secondary | ICD-10-CM

## 2013-10-11 DIAGNOSIS — G609 Hereditary and idiopathic neuropathy, unspecified: Secondary | ICD-10-CM

## 2013-10-11 DIAGNOSIS — I10 Essential (primary) hypertension: Secondary | ICD-10-CM

## 2013-10-11 DIAGNOSIS — I428 Other cardiomyopathies: Secondary | ICD-10-CM

## 2013-10-11 DIAGNOSIS — I5022 Chronic systolic (congestive) heart failure: Secondary | ICD-10-CM

## 2013-10-11 DIAGNOSIS — I82409 Acute embolism and thrombosis of unspecified deep veins of unspecified lower extremity: Secondary | ICD-10-CM

## 2013-10-11 DIAGNOSIS — C679 Malignant neoplasm of bladder, unspecified: Secondary | ICD-10-CM

## 2013-10-11 DIAGNOSIS — M48061 Spinal stenosis, lumbar region without neurogenic claudication: Secondary | ICD-10-CM

## 2013-10-11 DIAGNOSIS — E119 Type 2 diabetes mellitus without complications: Secondary | ICD-10-CM

## 2013-10-11 DIAGNOSIS — Z5181 Encounter for therapeutic drug level monitoring: Secondary | ICD-10-CM

## 2013-10-11 DIAGNOSIS — K5730 Diverticulosis of large intestine without perforation or abscess without bleeding: Secondary | ICD-10-CM

## 2013-10-11 DIAGNOSIS — K219 Gastro-esophageal reflux disease without esophagitis: Secondary | ICD-10-CM

## 2013-10-11 LAB — POCT INR: INR: 2.3

## 2013-10-11 MED ORDER — TRAMADOL HCL 50 MG PO TABS: 50.0000 mg | ORAL_TABLET | Freq: Three times a day (TID) | ORAL | Status: DC | PRN

## 2013-10-11 NOTE — Progress Notes (Signed)
Subjective:    Patient ID: [name] [name], male    DOB: 11/15/1922, 80 y.o.   MRN: [id number]  HPI 80 y/o WM w/ mult med problems here for a follow up OV...    ~  February 24, 2012:  38mo ROV & [name] [name] had his right TKR from [facility] 3/13> did well in rehab as far as his knee was concerned but then developed LBP & some leg weakness, now seeing DrRamos for this eval, given Norco which he likes better than Vicodin, & further eval w/ MRI is pending, he is currently getting some PT arranged by DrRamos;  Note> prev hx back injury yrs ago while working for [employer] w/ subseq surg...    He saw DrMcLean for Cards 5/13> nonischemic cardiomyopathy w/ dilLV & EF=25%; his Losartan 25mg  was incr to Bid; labs showed BUN=25, Creat=1.2, BNP=387 stable...    He saw DrDahlstedt, Urology 5/13> Hx prostate cancer w/ XRT 2002- prev saw DrPeterson, then Ottelin & released; hx OB & LUTS w/ freq & urgency on Gelnique (3pumps/d) but he never mentioned this med in our discussions...    We reviewed prob list, meds, xrays and labs> see below for updates >>  CXR 2/13 showed mild cardiomeg, clear lungs, NAD.Marland KitchenMarland Kitchen  EKG 4/13 showed NSR w/ PVCs, LAD, poor R prog, NSSTTWA...  LABS 8/13:  Chems- ok x BS=173 A1c=6.7;  CBC- ok x Hg=11.3;  TSH=1.03;  PSA=0.02  ~  August 31, 2012:  47mo ROV & [name] [name] is c/o a gastroenteritis w/ 4d diarrhea, took OTC rx & now improved- no further diarrhea in last 1-2d- but c/o vague discomfort- steady mid-abd area, cramping, nausea, no vomiting, denies heartburn, no fever or sweats "I got hot" and he notes Bentyl w/o help; we discussed CT Abd for further eval & he wants to proceed...     CAD, cardiomyopathy w/ EF=25%, LBBB> on Coreg6.25Bid, Losar25Bid, Lasix40; BP= 108/68 & he feels sl weak w/ mult somatic complaints; followed by DrMcLean for Cards- seen 9/13 & Coreg increased to 6.25Bid...    Hx DVT on Coumadin> stable w/ protimes via the [facility]...    Chol> on Prav40; last FLP 9/13 showed TChol 160, TG  111, HDL 54, LDL 84    DM> on diet alone; labs 8/13 showed BS= 173, A1c= 6.7; repeat labs 2/14 => pending    GERD, IBS> on Nexium40, Zantac150, Bentyl20prn; c/o vague abd symptoms & we decided to check CT Abd=> pending    Prostate cancer> s/p radium seeds in 2000 & PSA has been reading zero ever since (last check 8/13= 0.02)...    DJD, knee & LBP> on Voltaren gel, Vicodin, [id number]; s/p bilat TKRs & he is c/o LBP w/ eval by DrRamos...    Anemia> on Niferex; labs 8/13 showed Hg=11.3 and repeat CBC 2/14 => pending We reviewed prob list, meds, xrays and labs> see below for updates >>   LABS 2/14:  Chems- ok x BS=112 A1c=6.7;  CBC- wnl w/ Hg=12.6 WBC=5.4;  Sed=26  CT Abd&Pelvis 2/14 => 1.8cm lesion on the left bladder wall (prob bladder cancer) & we will refer to Urology for further eval;  Incidental findings include cysts on Liver, s/p GB, divertics, atherosclerotic changes, etc...   ~  February 20, 2013:  76mo ROV & [name] [name] had a 2cm bladder tumor discovered incidentally on a CT Abd; prev hx prostate ca w/ XRT in 2000; he hadTURBT by DrDahlstedt/Wrenn 3/14 w/ instillation of [place] (mitomycin) after the procedure; path showed a papillary noninvasive  low-grade tumor; he was off his coumadin transiently for the procedure & restarted after- Protimes per [facility] but they never placed the Coumadin back on his med list!  We will replace the Coumadin 2mg  on the list today... He continues to fg/u w/ DrDahlstedt & his new complaint is urinary incont/ leakage, he wears pads & is pretty upset about this symptom- he is on Myrbetriq trial- he will discuss w/ urology in f/u...    He had Cards f/u DrMcLean 3/14> nonischemic cardiomyop w/ dilated LV & EF=25% and diffuse HK, nonobstructive CAD on cath 2002, on Coumadin for hx DVT w/ protimes thru the [place] office [facility] as noted; he rec continuing Lasix, Coreg, Coumadin, & incr Losartan 50AM & 25PM We reviewed prob list, meds, xrays and labs> see below for updates >>    LABS  8/14:  Pt decided he wanted labs done now> FLP- ok on Prav40;  Chems- ok w/ BS=138 A1c=6.9 on diet alone;  CBC- ok;  TSH=2.77;  PSA=0.01   ~  October 11, 2013:  55mo ROV & [name] [name] is complaining that ever since he had his TKRs that he is noticing that his legs are getting weak- we discussed exercise, therapy, & f/u w/ his Ortho;  He also notes that since he had bladder surg that he is leaking more & advised to f/u w/ Urology... We reviewed the following medical problems during today's office visit >>     CAD, cardiomyopathy w/ EF=25%, LBBB> on Coreg12.5Bid, Lasix40; ?if he is taking Losartan? BP= 124/72 & he feels sl weak w/ mult somatic complaints; followed by [facility] for Cards...     Hx DVT on Coumadin> stable w/ protimes via the [facility]...    Chol> on Prav40; last FLP 8/14 showed TChol 157, TG 129, HDL 48, LDL 83    DM> on diet alone; labs 8/14 showed BS= 138, A1c= 6.9; repeat lab 3/15 BS=153    GERD, IBS> on Nexium40, Zantac150, Bentyl20prn; prev c/o vague abd symptoms resolved...    Prostate cancer, Bladder Cancer> s/p radium seeds in 2000 & PSA has been reading zero ever since (last check 8/13= 0.02); CT Abd w/ 2cm lesion in bladder & subseq TURBT drDalstedt...    DJD, knee & LBP> on Voltaren gel, Vicodin, [id number]; s/p bilat TKRs & he is c/o LBP w/ eval by DrRamos...    Anemia> on Niferex; labs 8/13 showed Hg=11.3 and repeat CBC 2/14 => Hg=12.1 We reviewed prob list, meds, xrays and labs> see below for updates >> He didn't bring med list or med bottles to review...    1.  Non-obstructive CAD, Cardiomyopathy, LBBB >>  ~  prev on LOSARTAN,  LASIX, and COREG... followed by Dr Aundra Dubin for Cards... his prev Toprol was stopped secondary to bradycardia, and he has prev refused to take Vasotec, c/o dizzy from Lisinopril, & head felt funny on Altace. ~  Cath 2002 reported w/ nonobstructive CAD ~  baseline EKG= NSR, LBBB, IVCD... ~  2DEcho 11/08 showed global HK, dil LV, EF=35-40%, mild AR/ MR...   ~  labs 6/09 showed BUN=20, Creat= 1.1, K= 4.8, BNP= 57... ~  2DEcho 5/10 showed LV mod dil w/ diffuse HK & EF= 30-35%, mild MR & AI, dil atria & incr PAsys=48. ~  Pre-op eval 12/10 by BB w/ repeat echo (no change)- pt states he advised against further ortho surg. ~  6/12:  Cards eval from DrMcLean> 2DEcho w/ EF=25% & diffuse HK; COREG added to his regimen... ~  2/13:  CXR showed mild cardiomegaly, clear lungs, NAD.Marland Kitchen. ~  5/13:  He had f/u DrMcLean> nonischemic cardiomyopathy w/ dilLV & EF=25%; his Losartan 25mg  was incr to Bid; labs showed BUN=25, Creat=1.2, BNP=387 stable. ~  8/13:  BP= 120/62 & stable medically; labs remain stable... ~  2/14: on Coreg6.25Bid, Losar25Bid, Lasix40; BP= 108/68 & he feels sl weak w/ mult somatic complaints; followed by DrMcLean for Cards- seen 9/13 & Coreg increased to 6.25Bid... ~  CXR 3/14 showed some hyperinflation & scarring at bases, no change from prev XRays/ NAD... ~  3/14: pt seen by DrMcLean and Losartan incr to 50mg Qam & 25mg Qpm (other meds same)... ~  3/15: he had f/u DrMcLean> last 2DEcho 7/12 showed EF=25% but he golfs 2x/wk, exerc on treadmill daily; told to continue his Coreg, Losartan, Lasix; NOTE that Losartan was removed from his med list the day after his OV w/ drMcLean when he went to the ER w/ laceration & the pharm tech removed it stating that he hadn't taken it in the last 30days...   2.  Hx DVT - he continues on Coumadin and followed in the coumadin clinic...  3.  DM - he's on diet alone... he reports recent incr in his BS ave at home to ~145 range. ~  labs 2/08 showed BS= 128, HgA1c = 6.3 ~  labs 6/09 showed BS= 118, HgA1c= 6.3 ~  he saw DrGroat 2/10 for eye check- no retinopathy... ~  labs 8/10 showed BS= 109, A1c= 6.3 ~  labs 8/11 showed BS= 112, A1c=6.7 ~  labs 2/12 showed BS= 116, A1c= 6.7.Marland KitchenMarland Kitchen OK to continue diet Rx. ~  Labs 2/13 on diet alone showed BS= 127, A1c= 6.7.Marland KitchenMarland Kitchen Discussed low carb diet. ~  Labs 8/13 showed BS= 173, A1c=  6.7.Marland KitchenMarland Kitchen Needs better low carb diet... ~  Labs 2/14 showed BS= 112, A1c= 6.7 ~  Labs 8/14 done at pt's request showed BS=138, A1c= 6.9  4.  Chol - on PRAVASTATIN 40 and tolerating this well... prev had trouble w/ Zocor- increased LFTs. ~  FLP 5/08 showed TChol 212, TG 77, HDL 75, LDL 108 ~  FLP 6/09 showed TChol 180, TG 80, HDL 54, LDL 110 ~  FLP 2/10 showed TChol 226, TG 43, HDL 65, LDL 148... he declined to incr the Prav to 80. ~  FLP 8/10 showed TChol 187, TG 87, HDL 52, LDL 87 ~  FLP 8/11 showed TChol 176, TG 87, HDL 56, LDL 102 ~  FLP 2/12 showed TChol 157, TG 82, HDL 55, LDL 86... Continue same. ~  FLP 2/13 on Prav40 showed TChol 146, TG 53, HDL 58, LDL 78 ~  FLP 9/13 on Prav40 showed TChol 160, TG 111, HDL 54, LDL 84 ~  FLP 8/14 on Prav40 showed TChol 157, TG 129, HDL 48, LDL 83  5.  DJD/ KNEE Arthritis/ LBP - he had a Left TKR by DrOlin 8/08... he went to the [facility] post op to recoup, then back home (& required EUA w/ manipulation to lyse adhesions)> after that his wife fell and broke her hip...  now having incr difficulty w/ right knee and had shots from Ortho... he takes VICODIN Tid Prn,  MOBIC 7.5mg  Prn, along w/ Calcium, MVI, & Vit D... he's been on LYRICA 75mg  Prn (ave Qod) for neuropathy in feet> he was intol to Gabapentin w/ "hot flashes" ~  DrOlin planned right TKR 1/11- pre-op eval from Cards & pt reports they advised against surg by DrBB therefore surg  not done. ~  2/12:  c/o weight gain on Lyrica (~Qod) for neuropathy; rec ch to Gabapentin 100>200>300mg  Qhs as trial, but subseq intol as above. ~  3/13:  He had Right TKR by DrAlusio  & post op rehab at [facility]; knee improved but c/o LBP worse since knee surg & being evaluated by DrRamos... ~  8/13:  He reports being changed from Vicodin to [place] which he likes better; on-going LBP eval from DrRamos w/ MRI pending...  6. GERD &  IBS - on NEXIUM 40mg /d, & Zantac 150mg HS, Bentyl 20mg Tid Prn which really helps his IBS  symptoms. ~  2/10:  c/o incr reflux & peptic symptoms... advised start PREVACID 30mg /d in AM & take Zantac HS...  ~  Subseq switched to [place] 40mg /d + ZANTAC 150mg  HS... ~  2/14: presented w/ vague abd symptoms- ?gastroenteritis; labs ok & CT Abd => CT Abd&Pelvis 2/14 => 1.8cm lesion on the left bladder wall (prob bladder cancer) & we will refer to Urology for further eval;  Incidental findings include cysts on Liver, s/p GB, divertics, atherosclerotic changes, etc.. ~  10/14: he had f/u DrPatterson> c/o some epig discomfort, and work up w/ Labs (OK x Sed=37) and MRCP (no recurrent duct stones, fatty replacement of the pancreas)...   7.  PROSTATE CANCER >> followed by DrPeterson/ Ottelin/ Dahlstedt & treated w/ radium seeds/ XRT in 2000... 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 ~  labs 2/10 showed PSA= 0.02 ~  labs 8/11 showed PSA= 0.02 ~  Labs 8/12 showed PSA= 0.02 ~  Labs 8/13 showed PSA= 0.02 ~  Labs 8/14 showed PSA= 0/01 ~  Cayce continues f/u for both cancers w/ DrDahlstedt, Urology...  8.  ANEMIA >> he has a borderline anemia & takes MVI... ~  Labs 8/10 showed Hg= 13.1, MCV= 95 ~  Labs 6/11 showed Hg= 12.4, MCV= 94 ~  Labs 8/12 showed Hg= 12.4, MCV= 97 ~  Labs 2/13 showed Hg= 11.9, MCV= 97 ~  Labs in [facility] 3/13 showed Hg down to 9.2=> start FeSO4... ~  Labs 8/13 showed Hg= 11.3.Marland KitchenMarland Kitchen Continue Niferex. ~  Labs 2/14 showed Hg= 12.6 on Niferex one daily... ~  Labs 8/14 showed Hg= 12.5  9.  SHINGLES? - he states that he "caught" shingles from a guy he was sitting next to at a restaurant & broke out on his leg 2-3d later... he went to see Derm, DrLeschin & treated...   Past Surgical History  Procedure Laterality Date  . Appendectomy    . Knee arthroscopy      L knee. 1991 by Dr. Jenean Lindau  . Ercp with sphincterotomy and stone  removal      5/93 by Dr. Fuller Plan  . Bilat inguinal hernia repair      1998 by Dr. March Rummage  . Radium seed implantation for prostate cancer      s/p. 1/00 by Dr. Terance Hart and Danny Lawless  . R cataract surgery      2004 by Dr. Herbert Deaner  . Repair of right recurrent right inguinal hernia      12/07 by Dr. Bubba Camp  . Basal cell cancer      L ear. s/p Moh's surgery 3/09 Dr. Harvel Quale  . Total knee arthroplasty      Left. 8/08 by Dr. Alvan Dame  . Tonsillectomy  09-28-11    child  .  Cholecystectomy      1985 by Dr. Laurin Coder  . Total knee arthroplasty  10/05/2011    Procedure: TOTAL KNEE ARTHROPLASTY;  Surgeon: Gearlean Alf, MD;  Location: [facility];  Service: Orthopedics;  Laterality: Right;  . Transurethral resection of bladder tumor N/A 09/30/2012    Procedure: TRANSURETHRAL RESECTION OF BLADDER TUMOR (TURBT);  Surgeon: Franchot Gallo, MD;  Location: [facility];  Service: Urology;  Laterality: N/A;  WITH MITOMYCIN INSTILLATION     Outpatient Encounter Prescriptions as of 10/11/2013  Medication Sig  . carvedilol (COREG) 12.5 MG tablet Take 1 tablet (12.5 mg total) by mouth 2 (two) times daily.  . diclofenac (VOLTAREN) 75 MG EC tablet Take 1 tablet (75 mg total) by mouth 2 (two) times daily.  Marland Kitchen dicyclomine (BENTYL) 20 MG tablet Take 20 mg by mouth 3 (three) times daily as needed. irritable bowel syndrome  . esomeprazole (NEXIUM) 40 MG capsule Take 1 capsule (40 mg total) by mouth daily before breakfast. 30 min before first meal of day.  . fish oil-omega-3 fatty acids 1000 MG capsule Take 1 g by mouth daily.  . furosemide (LASIX) 40 MG tablet Take 40 mg by mouth daily.  . pravastatin (PRAVACHOL) 40 MG tablet Take 1 tablet (40 mg total) by mouth at bedtime.  . pregabalin (LYRICA) 75 MG capsule Take 75 mg by mouth daily.  . ranitidine (ZANTAC) 150 MG tablet Take 1 tablet (150 mg total) by mouth 2 (two) times daily as needed. Heart burn  . warfarin (COUMADIN) 2 MG tablet Take 2-4 mg by mouth daily at 6 PM. 2mg   on m, w, f. 4mg  on t, r, s, s  . zolpidem (AMBIEN) 5 MG tablet Take 5 mg by mouth at bedtime as needed for sleep.    No Known Allergies   Current Medications, Allergies, Past Medical History, Past Surgical History, Family History, and Social History were reviewed in [employer] record.    Review of Systems    See HPI - all other systems neg except as noted...  The patient complains of dyspnea on exertion.  The patient denies anorexia, fever, weight loss, weight gain, vision loss, decreased hearing, hoarseness, chest pain, syncope, peripheral edema, prolonged cough, headaches, hemoptysis, abdominal pain, melena, hematochezia, severe indigestion/heartburn, hematuria, incontinence, muscle weakness, suspicious skin lesions, transient blindness, difficulty walking, depression, unusual weight change, abnormal bleeding, enlarged lymph nodes, and angioedema.     Objective:   Physical Exam    WD, WN, 88 WM in NAD... GENERAL:  Alert & oriented; pleasant & cooperative... HEENT:  [place]/AT, EOM-wnl, PERRLA, EACs-clear, TMs-wnl, NOSE-clear, THROAT-clear & wnl. NECK:  Supple w/ fair ROM; no JVD; normal carotid impulses w/o bruits; no thyromegaly or nodules palpated; no lymphadenopathy. CHEST:  Clear to P & A; without wheezes/ rales/ or rhonchi. HEART:  Regular Rhythm;  Gr 1/6 SEM w/o rubs or gallops. ABDOMEN:  Soft & nontender; normal bowel sounds; no organomegaly or masses detected. EXT:  s/p left TKR, mod-severe arthritic changes; mild venous insuffic changes, no signif edema. NEURO:  CN's intact; motor testing normal; sensory testing equivocal;  gait abnormal, balance fair. DERM:  No lesions noted; no rash etc...  RADIOLOGY DATA:  Reviewed in the EPIC EMR & discussed w/ the patient...  LABORATORY DATA:  Reviewed in the EPIC EMR & discussed w/ the patient...   Assessment & Plan:    Non-obstructive CAD, Cardiomyopathy w/ EF=25%, LBBB>  Followed by [facility] & his note  reviewed; Meds adjusted by Cards.Marland KitchenMarland Kitchen  Hx DVT>  He remains on Coumadin via the CC; they will help coordinate perioperative management...  CHOL>  On Prav40 & tolerating this reasonably well...  DM>  Controlled on diet alone & reminded to avoid sugars & sweets...  GI> GERD, IBS>  Recent gastroenteriitis & vague symptoms> w/u in progress- CBC is wnl, Sed=26, CT Abd=> pending  Prostate Cancer & Bladder Cancer>  followed by DrDahlstadt & stable as above; CT Abd & Pelvis 2/14 w/ 1.8cm lesion on left bladder wall=> s/p TURBT w/ Mitomycin etc...  DJD>  His operative risk is elev given his age, comorbid conditions, cardiomyopathy; he is willing to accept these risks given the severity of his arthritis & approaching inability to walk;  Continue Mobic, Vicodin, Lyrica, etc...   Patient's Medications  New Prescriptions   NITROGLYCERIN (NITROSTAT) 0.4 MG SL TABLET    Place 1 tablet (0.4 mg total) under the tongue every 5 (five) minutes as needed for chest pain.  Previous Medications   CARVEDILOL (COREG) 12.5 MG TABLET    Take 1 tablet (12.5 mg total) by mouth 2 (two) times daily.   DICLOFENAC (VOLTAREN) 75 MG EC TABLET    Take 1 tablet (75 mg total) by mouth 2 (two) times daily.   DICYCLOMINE (BENTYL) 20 MG TABLET    Take 20 mg by mouth 3 (three) times daily as needed. irritable bowel syndrome   ESOMEPRAZOLE (NEXIUM) 40 MG CAPSULE    Take 1 capsule (40 mg total) by mouth daily before breakfast. 30 min before first meal of day.   FISH OIL-OMEGA-3 FATTY ACIDS 1000 MG CAPSULE    Take 1 g by mouth daily.   FUROSEMIDE (LASIX) 40 MG TABLET    Take 40 mg by mouth daily.   PRAVASTATIN (PRAVACHOL) 40 MG TABLET    Take 1 tablet (40 mg total) by mouth at bedtime.   PREGABALIN (LYRICA) 75 MG CAPSULE    Take 75 mg by mouth daily.   RANITIDINE (ZANTAC) 150 MG TABLET    Take 1 tablet (150 mg total) by mouth 2 (two) times daily as needed. Heart burn   WARFARIN (COUMADIN) 2 MG TABLET    Take 2-4 mg by mouth daily at 6  PM. 2mg  on m, w, f. 4mg  on t, r, s, s   ZOLPIDEM (AMBIEN) 5 MG TABLET    Take 5 mg by mouth at bedtime as needed for sleep.  Modified Medications   Modified Medication Previous Medication   TRAMADOL (ULTRAM) 50 MG TABLET traMADol (ULTRAM) 50 MG tablet      Take 1 tablet (50 mg total) by mouth 3 (three) times daily as needed (arthritis pain).    Take 50 mg by mouth 3 (three) times daily as needed for moderate pain.  Discontinued Medications   No medications on file

## 2013-10-11 NOTE — Progress Notes (Signed)
,  lbpcmh

## 2013-10-11 NOTE — Patient Instructions (Signed)
Today we updated your med list in our EPIC system...    Continue your current medications the same...  We wrote a new prescription for TRAMADOL to try for arthritis pain...  Contact DrAlusio or Ramos at [place] for a follow up eval to see if more physical therapy might help...  Call for any questions or if we can be of service in any way.Marland KitchenMarland Kitchen

## 2013-10-11 NOTE — Progress Notes (Signed)
Pre visit review using our clinic review tool, if applicable. No additional management support is needed unless otherwise documented below in the visit note. 

## 2013-10-17 ENCOUNTER — Encounter: Payer: Self-pay | Admitting: Cardiology

## 2013-11-01 ENCOUNTER — Telehealth: Payer: Self-pay | Admitting: Pulmonary Disease

## 2013-11-01 NOTE — Telephone Encounter (Signed)
lmtcb x1 w/ spouse 

## 2013-11-01 NOTE — Telephone Encounter (Signed)
Spoke with pt and given phone number for  [facility] to set up appt.

## 2013-11-17 ENCOUNTER — Ambulatory Visit (INDEPENDENT_AMBULATORY_CARE_PROVIDER_SITE_OTHER): Payer: Medicare Other | Admitting: General Practice

## 2013-11-17 DIAGNOSIS — I82409 Acute embolism and thrombosis of unspecified deep veins of unspecified lower extremity: Secondary | ICD-10-CM

## 2013-11-17 DIAGNOSIS — Z5181 Encounter for therapeutic drug level monitoring: Secondary | ICD-10-CM

## 2013-11-17 LAB — POCT INR: INR: 1.7

## 2013-11-17 NOTE — Progress Notes (Signed)
Pre visit review using our clinic review tool, if applicable. No additional management support is needed unless otherwise documented below in the visit note. 

## 2013-11-22 ENCOUNTER — Encounter: Payer: Self-pay | Admitting: Internal Medicine

## 2013-11-22 ENCOUNTER — Ambulatory Visit (INDEPENDENT_AMBULATORY_CARE_PROVIDER_SITE_OTHER): Payer: Medicare Other | Admitting: Internal Medicine

## 2013-11-22 ENCOUNTER — Telehealth: Payer: Self-pay | Admitting: Cardiology

## 2013-11-22 VITALS — BP 160/74 | HR 70 | Ht 70.0 in

## 2013-11-22 DIAGNOSIS — I5022 Chronic systolic (congestive) heart failure: Secondary | ICD-10-CM

## 2013-11-22 DIAGNOSIS — R0789 Other chest pain: Secondary | ICD-10-CM

## 2013-11-22 DIAGNOSIS — R079 Chest pain, unspecified: Secondary | ICD-10-CM

## 2013-11-22 DIAGNOSIS — I447 Left bundle-branch block, unspecified: Secondary | ICD-10-CM

## 2013-11-22 DIAGNOSIS — I2510 Atherosclerotic heart disease of native coronary artery without angina pectoris: Secondary | ICD-10-CM

## 2013-11-22 MED ORDER — NITROGLYCERIN 0.4 MG SL SUBL: 0.4000 mg | SUBLINGUAL_TABLET | SUBLINGUAL | Status: AC | PRN

## 2013-11-22 NOTE — Telephone Encounter (Addendum)
Spoke with patient who states he has pain under his left arm down the side of his body that started yesterday.  Patient denies musculoskeletal injury and states he has not done any unusual or strenuous activity recently.  Patient states he has been visiting his wife in the hospital daily and has been able to walk the halls of the hospital without difficulty.  Patient denies SOB or chest pressure; denies tenderness when he presses the affected area.  Patient states he feels better today.  Patient states if pain worsens or does not resolve he will call back; states he will call back if he needs to see Dr. Aundra Dubin.

## 2013-11-22 NOTE — Progress Notes (Signed)
PCP:  Noralee Space, MD Primary Cardiologist:  Dr Aundra Dubin The patient presents today for add on DOD evaluation.  He has been doing well until this am.  Today, he has noticed discomfort over his L chest.  He describes well localized pain along thern L 3rd/4th costosternal area.  This pain is reproducible on palpation.  He denies substernal discomfort.   Today, he denies symptoms of palpitations,  shortness of breath, orthopnea, PND, lower extremity edema, dizziness, presyncope, syncope, or neurologic sequela.  The patient feels that he is tolerating medications without difficulties and is otherwise without complaint today.   Past Medical History  Diagnosis Date  . Coronary atherosclerosis of unspecified type of vessel, native or graft   . Other primary cardiomyopathies   . Other left bundle branch block   . DVT (deep venous thrombosis)     Hx of it. -7 yrs ago  . Other and unspecified hyperlipidemia   . Type II or unspecified type diabetes mellitus without mention of complication, not stated as uncontrolled   . Diverticulosis of colon (without mention of hemorrhage)   . Irritable bowel syndrome   . Personal history of malignant neoplasm of prostate   . DJD (degenerative joint disease)   . Spinal stenosis, lumbar region, without neurogenic claudication   . Unspecified hereditary and idiopathic peripheral neuropathy   . Nonischemic cardiomyopathy     EF 35-40%  . Coronary disease     nonobstructive coronary disease at catheterzation 2002.   Marland Kitchen DVT (deep venous thrombosis)     Hx of it on Coumadin therapy  . Sinus bradycardia     requiring discontinuation of Toprol   . Hypertension   . History of skin cancer   . Esophageal reflux     OCCASIONAL  . Bladder tumor   . Difficulty sleeping   . Prostate cancer     s/p radiation  . Unspecified hemorrhoids without mention of complication 1962  . Colon polyp 2001    tubular adenoma    Past Surgical History  Procedure Laterality Date  .  Appendectomy    . Knee arthroscopy      L knee. 1991 by Dr. Jenean Lindau  . Ercp with sphincterotomy and stone removal      5/93 by Dr. Fuller Plan  . Bilat inguinal hernia repair      1998 by Dr. March Rummage  . Radium seed implantation for prostate cancer      s/p. 1/00 by Dr. Terance Hart and Danny Lawless  . R cataract surgery      2004 by Dr. Herbert Deaner  . Repair of right recurrent right inguinal hernia      12/07 by Dr. Bubba Camp  . Basal cell cancer      L ear. s/p Moh's surgery 3/09 Dr. Harvel Quale  . Total knee arthroplasty      Left. 8/08 by Dr. Alvan Dame  . Tonsillectomy  09-28-11    child  . Cholecystectomy      1985 by Dr. Laurin Coder  . Total knee arthroplasty  10/05/2011    Procedure: TOTAL KNEE ARTHROPLASTY;  Surgeon: Gearlean Alf, MD;  Location: [facility];  Service: Orthopedics;  Laterality: Right;  . Transurethral resection of bladder tumor N/A 09/30/2012    Procedure: TRANSURETHRAL RESECTION OF BLADDER TUMOR (TURBT);  Surgeon: Franchot Gallo, MD;  Location: [facility];  Service: Urology;  Laterality: N/A;  WITH MITOMYCIN INSTILLATION     Current Outpatient Prescriptions  Medication Sig Dispense Refill  . carvedilol (COREG) 12.5 MG tablet  Take 1 tablet (12.5 mg total) by mouth 2 (two) times daily.  180 tablet  3  . diclofenac (VOLTAREN) 75 MG EC tablet Take 1 tablet (75 mg total) by mouth 2 (two) times daily.  180 tablet  3  . dicyclomine (BENTYL) 20 MG tablet Take 20 mg by mouth 3 (three) times daily as needed. irritable bowel syndrome      . esomeprazole (NEXIUM) 40 MG capsule Take 1 capsule (40 mg total) by mouth daily before breakfast. 30 min before first meal of day.  90 capsule  3  . fish oil-omega-3 fatty acids 1000 MG capsule Take 1 g by mouth daily.      . furosemide (LASIX) 40 MG tablet Take 40 mg by mouth daily.      . pravastatin (PRAVACHOL) 40 MG tablet Take 1 tablet (40 mg total) by mouth at bedtime.  90 tablet  1  . pregabalin (LYRICA) 75 MG capsule Take 75 mg by mouth daily.      . ranitidine  (ZANTAC) 150 MG tablet Take 1 tablet (150 mg total) by mouth 2 (two) times daily as needed. Heart burn  180 tablet  1  . traMADol (ULTRAM) 50 MG tablet Take 1 tablet (50 mg total) by mouth 3 (three) times daily as needed (arthritis pain).  50 tablet  1  . warfarin (COUMADIN) 2 MG tablet Take 2-4 mg by mouth daily at 6 PM. 2mg  on m, w, f. 4mg  on t, r, s, s      . zolpidem (AMBIEN) 5 MG tablet Take 5 mg by mouth at bedtime as needed for sleep.      . [DISCONTINUED] Calcium Carbonate-Vitamin D (CALTRATE 600+D) 600-400 MG-UNIT per tablet Take 1 tablet by mouth daily.        No current facility-administered medications for this visit.    No Known Allergies  History   Social History  . Marital Status: Married    Spouse Name: N/A    Number of Children: 0  . Years of Education: N/A   Occupational History  .     Social History Main Topics  . Smoking status: Former Smoker    Quit date: 07/20/1985  . Smokeless tobacco: Never Used  . Alcohol Use: No  . Drug Use: No  . Sexual Activity: Not on file   Other Topics Concern  . Not on file   Social History Narrative   Married, no children.     Family History  Problem Relation Age of Onset  . Throat cancer Brother   . Coronary artery disease Father     deceased    Physical Exam: Filed Vitals:   11/22/13 1536  BP: 160/74  Pulse: 70  Height: 5\' 10"  (1.778 m)    GEN- The patient is elderly appearing, alert and oriented x 3 today.   Head- normocephalic, atraumatic Eyes-  Sclera clear, conjunctiva pink Ears- hearing intact Oropharynx- clear Neck- supple  Lungs- Clear to ausculation bilaterally, normal work of breathing Heart- Regular rate and rhythm  GI- soft, NT, ND, + BS Extremities- no clubbing, cyanosis, or edema MS- well localized (size of a quarter) area of tenderness over the 3rd-4th costosternal area which reproduces pain Neuro- strength and sensation are intact  ekg today reveals sinus bradycardia 58 bpm, PR 206,  LBBB, no changes when compared to prior ekg  Assessment and Plan:  1. Musculoskeletal chest wall pain No ischemic workup planned given advanced age Conservative management planned He should contact our office  should he develops worsening pain, SOB, or other concerns  2. Nonischemic CM Stable No change required today  He will contact us within 48 hours if his pain does not resolve Follow-up with Dr Aundra Dubin as scheduled

## 2013-11-22 NOTE — Patient Instructions (Addendum)
Your physician recommends that you schedule a follow-up appointment in: 2 months with Dr Aundra Dubin   Call back Fri if symptoms not any better   Dr Rayann Heman wanted NTG called in for patient--- he advised this after the patient ws gone.  I called the medication in and left message on patient's voicemail with instructions

## 2013-11-22 NOTE — Telephone Encounter (Signed)
80 yo male with nonischemic CMP. Received call from patient regarding left shoulder pain at 4:30 am. Pt very hard of hearing on telephone. Unable to describe further. Recommended that if he thought it was related to his heart that he seek emergent care. He verbalized understanding. FYI to Dr. Aundra Dubin

## 2013-11-22 NOTE — Telephone Encounter (Signed)
Please contact patient and see if he is having any problems this morning.

## 2013-11-22 NOTE — Addendum Note (Signed)
Addended by: Janan Halter F on: 11/22/2013 06:22 PM   Modules accepted: Orders

## 2013-12-08 ENCOUNTER — Other Ambulatory Visit: Payer: Self-pay | Admitting: Pulmonary Disease

## 2013-12-12 ENCOUNTER — Ambulatory Visit: Payer: Medicare Other | Admitting: Family Medicine

## 2013-12-15 ENCOUNTER — Ambulatory Visit (INDEPENDENT_AMBULATORY_CARE_PROVIDER_SITE_OTHER): Payer: Medicare Other | Admitting: Family Medicine

## 2013-12-15 DIAGNOSIS — I82409 Acute embolism and thrombosis of unspecified deep veins of unspecified lower extremity: Secondary | ICD-10-CM

## 2013-12-15 DIAGNOSIS — Z5181 Encounter for therapeutic drug level monitoring: Secondary | ICD-10-CM

## 2013-12-15 LAB — POCT INR: INR: 2.2

## 2013-12-19 ENCOUNTER — Telehealth: Payer: Self-pay | Admitting: *Deleted

## 2013-12-19 ENCOUNTER — Other Ambulatory Visit: Payer: Self-pay

## 2013-12-19 ENCOUNTER — Ambulatory Visit (INDEPENDENT_AMBULATORY_CARE_PROVIDER_SITE_OTHER): Payer: Medicare Other | Admitting: Physician Assistant

## 2013-12-19 ENCOUNTER — Other Ambulatory Visit (INDEPENDENT_AMBULATORY_CARE_PROVIDER_SITE_OTHER): Payer: Medicare Other

## 2013-12-19 ENCOUNTER — Encounter: Payer: Self-pay | Admitting: Physician Assistant

## 2013-12-19 VITALS — BP 132/72 | HR 70 | Ht 70.0 in | Wt 185.0 lb

## 2013-12-19 DIAGNOSIS — R9389 Abnormal findings on diagnostic imaging of other specified body structures: Secondary | ICD-10-CM

## 2013-12-19 DIAGNOSIS — R1013 Epigastric pain: Secondary | ICD-10-CM

## 2013-12-19 DIAGNOSIS — C259 Malignant neoplasm of pancreas, unspecified: Secondary | ICD-10-CM

## 2013-12-19 DIAGNOSIS — K8689 Other specified diseases of pancreas: Secondary | ICD-10-CM

## 2013-12-19 DIAGNOSIS — K869 Disease of pancreas, unspecified: Secondary | ICD-10-CM

## 2013-12-19 DIAGNOSIS — K838 Other specified diseases of biliary tract: Secondary | ICD-10-CM

## 2013-12-19 DIAGNOSIS — I2510 Atherosclerotic heart disease of native coronary artery without angina pectoris: Secondary | ICD-10-CM

## 2013-12-19 LAB — CBC WITH DIFFERENTIAL/PLATELET
Basophils Absolute: 0 10*3/uL (ref 0.0–0.1)
Basophils Relative: 0.4 % (ref 0.0–3.0)
EOS ABS: 0 10*3/uL (ref 0.0–0.7)
EOS PCT: 0.7 % (ref 0.0–5.0)
HCT: 32.3 % — ABNORMAL LOW (ref 39.0–52.0)
Hemoglobin: 11.2 g/dL — ABNORMAL LOW (ref 13.0–17.0)
Lymphocytes Relative: 28.7 % (ref 12.0–46.0)
Lymphs Abs: 1.3 10*3/uL (ref 0.7–4.0)
MCHC: 34.7 g/dL (ref 30.0–36.0)
MCV: 92.9 fl (ref 78.0–100.0)
MONO ABS: 0.3 10*3/uL (ref 0.1–1.0)
Monocytes Relative: 7 % (ref 3.0–12.0)
NEUTROS PCT: 63.2 % (ref 43.0–77.0)
Neutro Abs: 2.9 10*3/uL (ref 1.4–7.7)
PLATELETS: 189 10*3/uL (ref 150.0–400.0)
RBC: 3.48 Mil/uL — ABNORMAL LOW (ref 4.22–5.81)
RDW: 14.2 % (ref 11.5–15.5)
WBC: 4.5 10*3/uL (ref 4.0–10.5)

## 2013-12-19 LAB — COMPREHENSIVE METABOLIC PANEL
ALK PHOS: 47 U/L (ref 39–117)
ALT: 13 U/L (ref 0–53)
AST: 20 U/L (ref 0–37)
Albumin: 3.9 g/dL (ref 3.5–5.2)
BUN: 26 mg/dL — ABNORMAL HIGH (ref 6–23)
CO2: 28 mEq/L (ref 19–32)
Calcium: 9.4 mg/dL (ref 8.4–10.5)
Chloride: 103 mEq/L (ref 96–112)
Creatinine, Ser: 1.1 mg/dL (ref 0.4–1.5)
GFR: 65.66 mL/min (ref >60.00)
Glucose, Bld: 158 mg/dL — ABNORMAL HIGH (ref 70–99)
POTASSIUM: 4.5 meq/L (ref 3.5–5.1)
SODIUM: 137 meq/L (ref 135–145)
TOTAL PROTEIN: 6.8 g/dL (ref 6.0–8.3)
Total Bilirubin: 0.7 mg/dL (ref 0.2–1.2)

## 2013-12-19 LAB — PROTIME-INR
INR: 2 ratio — ABNORMAL HIGH (ref 0.8–1.0)
Prothrombin Time: 22 s — ABNORMAL HIGH (ref 9.6–13.1)

## 2013-12-19 LAB — LIPASE: LIPASE: 8 U/L — AB (ref 11.0–59.0)

## 2013-12-19 MED ORDER — HYDROCODONE-ACETAMINOPHEN 7.5-325 MG PO TABS: 1.0000 | ORAL_TABLET | Freq: Four times a day (QID) | ORAL | Status: DC | PRN

## 2013-12-19 NOTE — Progress Notes (Signed)
 Subjective:    Patient ID: [name] [name], male    DOB: 08/04/1922, 80 y.o.   MRN: [id number]  HPI  [name] [name] is a very nice 80-year-old white male previously a patient of Dr. Patterson's who is being referred today by Dr. Dahlstet for evaluation of abnormal pancreas on recent CT scan. Patient has history of prostate cancer and had undergone CT scan of the abdomen and pelvis with contrast on 12/06/2013 done at [facility]. CT scan showed borderline cardiomegaly and abnormal enhancing infiltrative soft tissue density appearing to occlude the distal common bile duct and tracking in the vicinity of the pancreatic head and in between the SMA and SMV concerning for pancreatic adenocarcinoma or possible metastatic disease. There is intraoperative and extrahepatic biliary ductal dilation. The soft tissue mass measures 2.8 x 2.9 cm and is difficult to separate from the adjacent pancreatic head. Patient had undergone very remote cholecystectomy some 30 years ago. He says he has been having epigastric pain over the past year, somewhat progressive and describes it as sharp and located in the epigastrium. He has not noted any change with by mouth intake and has not had any weight loss. His appetite has been fine no nausea or vomiting. No fever or chills. His bowel movements have been somewhat constipated. He says his stomach hurts all the time despite taking Nexium regularly. He also has some hydrocodone at home which she says is not very helpful. Patient had undergone workup for the dilated common bile duct with MRI and MRCP in October of 2014. This showed chronic biliary dilation pneumobilia consistent with prior sphincterotomy common bile duct 1.8 cm severe fatty replacement of the pancreas no pancreatic ductal dilation or evidence of pancreatic mass and no evidence of choledocholithiasis.    Review of Systems  Constitutional: Negative.   Eyes: Negative.   Respiratory: Negative.   Cardiovascular: Negative.     Gastrointestinal: Positive for abdominal pain.  Endocrine: Negative.   Genitourinary: Negative.   Musculoskeletal: Negative.   Skin: Negative.   Allergic/Immunologic: Negative.   Neurological: Negative.   Hematological: Negative.   Psychiatric/Behavioral: Negative.    Outpatient Prescriptions Prior to Visit  Medication Sig Dispense Refill  . carvedilol (COREG) 12.5 MG tablet Take 1 tablet (12.5 mg total) by mouth 2 (two) times daily.  180 tablet  3  . diclofenac (VOLTAREN) 75 MG EC tablet Take 1 tablet (75 mg total) by mouth 2 (two) times daily.  180 tablet  3  . dicyclomine (BENTYL) 20 MG tablet Take 20 mg by mouth 3 (three) times daily as needed. irritable bowel syndrome      . esomeprazole (NEXIUM) 40 MG capsule Take 1 capsule (40 mg total) by mouth daily before breakfast. 30 min before first meal of day.  90 capsule  3  . fish oil-omega-3 fatty acids 1000 MG capsule Take 1 g by mouth daily.      . furosemide (LASIX) 40 MG tablet Take 40 mg by mouth daily.      . nitroGLYCERIN (NITROSTAT) 0.4 MG SL tablet Place 1 tablet (0.4 mg total) under the tongue every 5 (five) minutes as needed for chest pain.  90 tablet  3  . pravastatin (PRAVACHOL) 40 MG tablet Take 1 tablet (40 mg total) by mouth at bedtime.  90 tablet  1  . pregabalin (LYRICA) 75 MG capsule Take 75 mg by mouth daily.      . ranitidine (ZANTAC) 150 MG tablet Take 1 tablet (150 mg total) by mouth   2 (two) times daily as needed. Heart burn  180 tablet  1  . traMADol (ULTRAM) 50 MG tablet Take 1 tablet (50 mg total) by mouth 3 (three) times daily as needed (arthritis pain).  50 tablet  1  . warfarin (COUMADIN) 2 MG tablet Take 2-4 mg by mouth daily at 6 PM. 2mg on m, w, f. 4mg on t, r, s, s      . zolpidem (AMBIEN) 5 MG tablet TAKE 1 TABLET BY MOUTH AT BEDTIME AS NEEDED  30 tablet  1   No facility-administered medications prior to visit.   No Known Allergies Patient Active Problem List   Diagnosis Date Noted  . Atypical chest  pain 11/22/2013  . Encounter for therapeutic drug monitoring 08/18/2013  . Bladder cancer 02/20/2013  . Anemia of chronic disease 02/20/2013  . OA (osteoarthritis) of knee 10/05/2011  . PVC's (premature ventricular contractions) 10/05/2011  . HYPERTENSION, BENIGN 06/23/2010  . SYSTOLIC HEART FAILURE, CHRONIC 11/20/2008  . PERIPHERAL NEUROPATHY 07/06/2007  . CORONARY ARTERY DISEASE 07/06/2007  . CARDIOMYOPATHY 07/06/2007  . LBBB 07/06/2007  . DIVERTICULOSIS, COLON 07/06/2007  . SPINAL STENOSIS, LUMBAR 07/06/2007  . DM 05/03/2007  . HYPERLIPIDEMIA 05/03/2007  . DVT 05/03/2007  . GERD 05/03/2007  . IBS 05/03/2007  . DEGENERATIVE JOINT DISEASE 05/03/2007  . PROSTATE CANCER, HX OF 05/03/2007   History  Substance Use Topics  . Smoking status: Former Smoker    Quit date: 07/20/1985  . Smokeless tobacco: Never Used  . Alcohol Use: No   family history includes Coronary artery disease in his father; Throat cancer in his brother.     Objective:   Physical Exam Well-developed elderly white male in no acute distress, pleasant blood pressure 132/72 pulse 70 height 5 foot 10 weight 185. HEENT ;nontraumatic normocephalic EOMI PERRLA sclera anicteric, Supple no JVD, Cardiovascular; regular rate and rhythm with S1-S2 no murmur or gallop, Pulmonary; clear bilaterally, Abdomen; soft basically nontender there is no palpable mass or hepatosplenomegaly does have a midline scar from remote cholecystectomy, bowel sounds are active, Rectal ;exam not done, Extremities; no clubbing cyanosis or edema skin warm and dry, Psych ;mood and affect appropriate       Assessment & Plan:  #1  80-year-old white male with persistent epigastric pain over several months and recent CT scan showing intra-and extrahepatic ductal dilation and what appears to be an infiltrative soft tissue mass around the distal common bile duct measuring 2.8 x 2.9 cm and difficult to separate from the adjacent pancreatic head, common bile  duct measuring up to 2.3 cm -malignancy favored over inflammatory process. This patient has had a chronically dilated common bile duct and MRCP done October 2014 did not show any evidence of pancreatic mass #2 diabetes mellitus #3 hypertension #4 coronary artery disease #5 cardiomyopathy with EF of 35-40% #6 history of DVT on chronic Coumadin #7 status post remote cholecystectomy #8 history of prostate cancer status post radiation  Plan; Patient will continue Nexium 40 mg by mouth every morning Voltaren is  on his list but he states he has not taken this for a long time CBC with differential same at lipase and CA 19-9 as well as pro time Schedule patient for endoscopic ultrasound with probable biopsy and ERCP with Dr. Jacobs for next week June 11. Procedures were discussed in detail with the patient and he is agreeable to proceed. Dr. Jacobs has also reviewed his scan Patient will need to hold Coumadin for 5 days prior to the   procedure and we'll obtain consent from his cardiologist Dr. Mclean Vicodin 7.5/325 one every 6 hours as needed for pain 

## 2013-12-19 NOTE — Telephone Encounter (Signed)
12/19/2013   RE: [name] [name] DOB: [birth year] MRN: [id number]   Dear Dr. Loralie Champagne,    We have scheduled the above patient for an endoscopic procedure. Our records show that he is on anticoagulation therapy.   Dr. Lenna Gilford previously managed this patient's coumadin prior to Dr. Jeannine Kitten retirement. Please advise as to how long the patient may come off his therapy of Coumadin prior to the procedure, which is scheduled for 12-28-2013.Marland Kitchen  Please fax back/ or route the completed form to [facility] at [phone].   Sincerely,

## 2013-12-19 NOTE — Patient Instructions (Addendum)
You have been scheduled for an endoscopic Ultrasound/ ERCP  with propofol. Please follow written instructions given to you at your visit today.  We will call you once we hear from the cardiologist, Dr. Aundra Dubin regarding the coumadin medication.

## 2013-12-20 ENCOUNTER — Encounter: Payer: Self-pay | Admitting: Internal Medicine

## 2013-12-20 ENCOUNTER — Ambulatory Visit: Payer: Medicare Other

## 2013-12-20 ENCOUNTER — Ambulatory Visit (INDEPENDENT_AMBULATORY_CARE_PROVIDER_SITE_OTHER): Payer: Medicare Other | Admitting: Internal Medicine

## 2013-12-20 VITALS — BP 130/62 | HR 68 | Temp 98.4°F | Ht 70.0 in | Wt 183.0 lb

## 2013-12-20 DIAGNOSIS — I2510 Atherosclerotic heart disease of native coronary artery without angina pectoris: Secondary | ICD-10-CM

## 2013-12-20 DIAGNOSIS — R109 Unspecified abdominal pain: Secondary | ICD-10-CM

## 2013-12-20 DIAGNOSIS — E119 Type 2 diabetes mellitus without complications: Secondary | ICD-10-CM

## 2013-12-20 DIAGNOSIS — Z8546 Personal history of malignant neoplasm of prostate: Secondary | ICD-10-CM

## 2013-12-20 DIAGNOSIS — D649 Anemia, unspecified: Secondary | ICD-10-CM

## 2013-12-20 DIAGNOSIS — I82409 Acute embolism and thrombosis of unspecified deep veins of unspecified lower extremity: Secondary | ICD-10-CM

## 2013-12-20 HISTORY — DX: Unspecified abdominal pain: R10.9

## 2013-12-20 LAB — CANCER ANTIGEN 19-9: CA 19-9: 169.6 U/mL — ABNORMAL HIGH (ref <35.0)

## 2013-12-20 LAB — HEMOGLOBIN A1C: HEMOGLOBIN A1C: 7.3 % — AB (ref 4.6–6.5)

## 2013-12-20 MED ORDER — DICLOFENAC SODIUM 1 % TD GEL: 2.0000 g | Freq: Three times a day (TID) | TRANSDERMAL | Status: DC

## 2013-12-20 MED ORDER — POLYETHYLENE GLYCOL 3350 17 GM/SCOOP PO POWD: 17.0000 g | Freq: Two times a day (BID) | ORAL | Status: DC | PRN

## 2013-12-20 NOTE — Telephone Encounter (Signed)
He can hold coumadin 5 days for procedure.

## 2013-12-20 NOTE — Assessment & Plan Note (Signed)
Patient followed by Dr. Diona Fanti.  He has been treated with radioactive seeds. He is chronic issues with urinary incontinence.

## 2013-12-20 NOTE — Progress Notes (Signed)
I agree with the note above.  Given the normal LFTs, not sure if he will necessarily need ERCP.

## 2013-12-20 NOTE — Progress Notes (Signed)
Subjective:    Patient ID: [name] [name], male    DOB: 1922-09-09, 80 y.o.   MRN: [id number]  HPI  80 year old white male with complex medical history to establish. Patient to receive followed by Dr. Lenna Gilford.  Patient recently seen by gastroenterology for chronic abdominal issues. He complains of epigastric discomfort.  Patient underwent MRI of abdomen due to concerns for possible pancreatic malignancy. It showed stable chronic biliary distention status post cholecystectomy and presumed sphincterotomy. No evidence of choledocholithiasis. He has stable severe fatty replacement of the pancreas. No pancreatic ductal dilatation. Patient's CA 19-9 was elevated at 1 69.6.  Dr. Ardis Hughs planning diagnostic procedure.  Patient also reports history of chronic osteoarthritis. He currently takes diclofenac 75 mg twice daily. He also is taking hydrocodone 7.5/325 mg. He was recently started on tramadol to replace diclofenac. He reports tramadol causing slight unsteadiness and constipation.  He has history of DVT and is chronically anticoagulated. He denies any bleeding issues. He reports slight unsteady gait but has not had any falls for the last one year.  Type II Diabetes - diet controlled.  He is due for A1c.  CAD - followed by cardiology.   Review of Systems  Constitutional: Negative for activity change . Weight loss over 1 year Eyes: Negative for visual disturbance.  Respiratory: Negative for cough, chest tightness and shortness of breath.   Cardiovascular: Negative for chest pain.  Genitourinary: Negative for difficulty urinating.  Neurological: Negative for headaches.  Gastrointestinal: Positive for abdominal pain, negative for  melena or hematochezia Psych: Negative for depression or anxiety Endo:  No polyuria or polydypsia        Past Medical History  Diagnosis Date  . Coronary atherosclerosis of unspecified type of vessel, native or graft   . Other primary cardiomyopathies   . Other left  bundle branch block   . DVT (deep venous thrombosis)     Hx of it. -7 yrs ago  . Other and unspecified hyperlipidemia   . Type II or unspecified type diabetes mellitus without mention of complication, not stated as uncontrolled   . Diverticulosis of colon (without mention of hemorrhage)   . Irritable bowel syndrome   . Personal history of malignant neoplasm of prostate   . DJD (degenerative joint disease)   . Spinal stenosis, lumbar region, without neurogenic claudication   . Unspecified hereditary and idiopathic peripheral neuropathy   . Nonischemic cardiomyopathy     EF 35-40%  . Coronary disease     nonobstructive coronary disease at catheterzation 2002.   Marland Kitchen DVT (deep venous thrombosis)     Hx of it on Coumadin therapy  . Sinus bradycardia     requiring discontinuation of Toprol   . Hypertension   . History of skin cancer   . Esophageal reflux     OCCASIONAL  . Bladder tumor   . Difficulty sleeping   . Prostate cancer     s/p radiation  . Unspecified hemorrhoids without mention of complication 8676  . Colon polyp 2001    tubular adenoma     History   Social History  . Marital Status: Married    Spouse Name: N/A    Number of Children: 0  . Years of Education: N/A   Occupational History  .     Social History Main Topics  . Smoking status: Former Smoker    Quit date: 07/20/1985  . Smokeless tobacco: Never Used  . Alcohol Use: No  . Drug Use: No  .  Sexual Activity: Not on file   Other Topics Concern  . Not on file   Social History Narrative   Married, no children.     Past Surgical History  Procedure Laterality Date  . Appendectomy    . Knee arthroscopy      L knee. 1991 by Dr. Jenean Lindau  . Ercp with sphincterotomy and stone removal      5/93 by Dr. Fuller Plan  . Bilat inguinal hernia repair      1998 by Dr. March Rummage  . Radium seed implantation for prostate cancer      s/p. 1/00 by Dr. Terance Hart and Danny Lawless  . R cataract surgery      2004 by Dr. Herbert Deaner  .  Repair of right recurrent right inguinal hernia      12/07 by Dr. Bubba Camp  . Basal cell cancer      L ear. s/p Moh's surgery 3/09 Dr. Harvel Quale  . Total knee arthroplasty      Left. 8/08 by Dr. Alvan Dame  . Tonsillectomy  09-28-11    child  . Cholecystectomy      1985 by Dr. Laurin Coder  . Total knee arthroplasty  10/05/2011    Procedure: TOTAL KNEE ARTHROPLASTY;  Surgeon: Gearlean Alf, MD;  Location: [facility];  Service: Orthopedics;  Laterality: Right;  . Transurethral resection of bladder tumor N/A 09/30/2012    Procedure: TRANSURETHRAL RESECTION OF BLADDER TUMOR (TURBT);  Surgeon: Franchot Gallo, MD;  Location: [facility];  Service: Urology;  Laterality: N/A;  WITH MITOMYCIN INSTILLATION     Family History  Problem Relation Age of Onset  . Throat cancer Brother   . Coronary artery disease Father     deceased    No Known Allergies  Current Outpatient Prescriptions on File Prior to Visit  Medication Sig Dispense Refill  . carvedilol (COREG) 12.5 MG tablet Take 1 tablet (12.5 mg total) by mouth 2 (two) times daily.  180 tablet  3  . dicyclomine (BENTYL) 20 MG tablet Take 20 mg by mouth 3 (three) times daily as needed. irritable bowel syndrome      . esomeprazole (NEXIUM) 40 MG capsule Take 1 capsule (40 mg total) by mouth daily before breakfast. 30 min before first meal of day.  90 capsule  3  . fish oil-omega-3 fatty acids 1000 MG capsule Take 1 g by mouth daily.      . furosemide (LASIX) 40 MG tablet Take 40 mg by mouth daily.      Marland Kitchen HYDROcodone-acetaminophen (NORCO) 7.5-325 MG per tablet Take 1 tablet by mouth every 6 (six) hours as needed for moderate pain.  30 tablet  0  . nitroGLYCERIN (NITROSTAT) 0.4 MG SL tablet Place 1 tablet (0.4 mg total) under the tongue every 5 (five) minutes as needed for chest pain.  90 tablet  3  . pravastatin (PRAVACHOL) 40 MG tablet Take 1 tablet (40 mg total) by mouth at bedtime.  90 tablet  1  . pregabalin (LYRICA) 75 MG capsule Take 75 mg by mouth daily.       . ranitidine (ZANTAC) 150 MG tablet Take 1 tablet (150 mg total) by mouth 2 (two) times daily as needed. Heart burn  180 tablet  1  . warfarin (COUMADIN) 2 MG tablet Take 2-4 mg by mouth daily at 6 PM. 2mg  on m, w, f. 4mg  on t, r, s, s      . zolpidem (AMBIEN) 5 MG tablet TAKE 1 TABLET BY MOUTH AT BEDTIME AS NEEDED  30 tablet  1  . [DISCONTINUED] Calcium Carbonate-Vitamin D (CALTRATE 600+D) 600-400 MG-UNIT per tablet Take 1 tablet by mouth daily.        No current facility-administered medications on file prior to visit.    BP 130/62  Pulse 68  Temp(Src) 98.4 F (36.9 C) (Oral)  Ht 5\' 10"  (1.778 m)  Wt 183 lb (83.008 kg)  BMI 26.26 kg/m2    Objective:   Physical Exam  Constitutional: He is oriented to person, place, and time. He appears well-developed and well-nourished. No distress.  HENT:  Head: Normocephalic and atraumatic.  Right Ear: External ear normal.  Left Ear: External ear normal.  Mouth/Throat: Oropharynx is clear and moist.  Eyes: EOM are normal. Pupils are equal, round, and reactive to light.  Neck: Neck supple.  No carotid bruit  Cardiovascular: Normal rate, regular rhythm and normal heart sounds.   No murmur heard. Pulmonary/Chest: Effort normal and breath sounds normal. He has no wheezes.  Abdominal: Soft. Bowel sounds are normal.  Mild epigastric tenderness  Musculoskeletal: He exhibits no edema.  Scoliosis of spine  Neurological: He is alert and oriented to person, place, and time.  Slightly unstable gait  Skin: Skin is warm and dry.  Psychiatric: He has a normal mood and affect. His behavior is normal.          Assessment & Plan:

## 2013-12-20 NOTE — Assessment & Plan Note (Signed)
Patient has history of DVT and is currently anticoagulated. I am concerned about his gait instability. Patient cautioned about using tramadol or other narcotics. Patient to use lower dose of hydrocodone.

## 2013-12-20 NOTE — Assessment & Plan Note (Signed)
Monitor A1c.  He is currently diet controlled.

## 2013-12-20 NOTE — Progress Notes (Signed)
Pre visit review using our clinic review tool, if applicable. No additional management support is needed unless otherwise documented below in the visit note. 

## 2013-12-20 NOTE — Assessment & Plan Note (Signed)
80 year old white male with abdominal pain of unclear etiology. MRI of abdomen reviewed. He is followed by gastroenterology and has upcoming procedure.  We discussed possibility that NSAID gastritis may be playing a role. Discontinue diclofenac. Use diclofenac gel for predominant hand arthritis.

## 2013-12-21 NOTE — Telephone Encounter (Signed)
I advised the patient to stop his coumadin on 6-6 and resume it on 12-29-2013 unless otherwise instructed.  Patient verbalized understanding these instructions.

## 2013-12-22 ENCOUNTER — Ambulatory Visit: Payer: Medicare Other | Admitting: Internal Medicine

## 2013-12-22 ENCOUNTER — Encounter (HOSPITAL_COMMUNITY): Payer: Self-pay | Admitting: Pharmacy Technician

## 2013-12-22 ENCOUNTER — Encounter (HOSPITAL_COMMUNITY): Payer: Self-pay | Admitting: *Deleted

## 2013-12-25 ENCOUNTER — Other Ambulatory Visit: Payer: Self-pay | Admitting: Internal Medicine

## 2013-12-28 ENCOUNTER — Ambulatory Visit (HOSPITAL_COMMUNITY)
Admission: RE | Admit: 2013-12-28 | Discharge: 2013-12-28 | Disposition: A | Discharge status: Home / Self Care | Payer: Medicare Other | Source: Ambulatory Visit | Attending: Gastroenterology | Admitting: Gastroenterology

## 2013-12-28 ENCOUNTER — Encounter (HOSPITAL_COMMUNITY)
Admission: RE | Disposition: A | Discharge status: Home / Self Care | Payer: Self-pay | Source: Ambulatory Visit | Attending: Gastroenterology

## 2013-12-28 ENCOUNTER — Telehealth: Payer: Self-pay | Admitting: Gastroenterology

## 2013-12-28 ENCOUNTER — Encounter (HOSPITAL_COMMUNITY): Payer: Self-pay | Admitting: *Deleted

## 2013-12-28 ENCOUNTER — Encounter (HOSPITAL_COMMUNITY): Payer: Self-pay | Admitting: Anesthesiology

## 2013-12-28 DIAGNOSIS — K838 Other specified diseases of biliary tract: Secondary | ICD-10-CM | POA: Insufficient documentation

## 2013-12-28 DIAGNOSIS — K869 Disease of pancreas, unspecified: Secondary | ICD-10-CM | POA: Insufficient documentation

## 2013-12-28 DIAGNOSIS — K8689 Other specified diseases of pancreas: Secondary | ICD-10-CM

## 2013-12-28 DIAGNOSIS — Z538 Procedure and treatment not carried out for other reasons: Secondary | ICD-10-CM | POA: Insufficient documentation

## 2013-12-28 HISTORY — DX: Unspecified osteoarthritis, unspecified site: M19.90

## 2013-12-28 LAB — PROTIME-INR
INR: 2.33 — AB (ref 0.00–1.49)
PROTHROMBIN TIME: 24.8 s — AB (ref 11.6–15.2)

## 2013-12-28 LAB — GLUCOSE, CAPILLARY: GLUCOSE-CAPILLARY: 167 mg/dL — AB (ref 70–99)

## 2013-12-28 SURGERY — CANCELLED PROCEDURE

## 2013-12-28 MED ORDER — FENTANYL CITRATE 0.05 MG/ML IJ SOLN
INTRAMUSCULAR | Status: AC
  Filled 2013-12-28: qty 2

## 2013-12-28 MED ORDER — LIDOCAINE HCL (CARDIAC) 20 MG/ML IV SOLN
INTRAVENOUS | Status: AC
  Filled 2013-12-28: qty 5

## 2013-12-28 MED ORDER — SODIUM CHLORIDE 0.9 % IV SOLN: INTRAVENOUS | Status: DC

## 2013-12-28 MED ORDER — LACTATED RINGERS IV SOLN
INTRAVENOUS | Status: DC
  Administered 2013-12-28: 1000 mL via INTRAVENOUS

## 2013-12-28 MED ORDER — PROPOFOL 10 MG/ML IV BOLUS
INTRAVENOUS | Status: AC
  Filled 2013-12-28: qty 20

## 2013-12-28 NOTE — Discharge Instructions (Addendum)
Endoscopic Ultrasound  Advised patient and caregiver procedure rescheduled for 01/04/2014, Thursday at 1:00PM and to arrive by 11:30am.  Endoscopic Ultrasound is a procedure to examine the first part of the small intestine (duodenum), bile duct, and the pancreas. A long, flexible, lighted tube with a camera attached (endoscope) is inserted down the throat to view these organs. This procedure is done to detect problems or abnormalities. The procedure lasts about 5 20 minutes. It is usually an outpatient procedure, but it may need to be performed in emergency cases in the hospital. LET YOUR CAREGIVER KNOW ABOUT:   Allergies to food or medicine.  All medicines you are taking, including vitamins, herbs, eyedrops, and over-the-counter medicines and creams.  Use of steroids (by mouth or creams).  Previous problems you or members of your family have had with the use of anesthetics.  Any blood disorders you have.  Previous surgeries you have had.  Other health problems you have.  Possibility of pregnancy, if this applies. RISKS AND COMPLICATIONS  Generally, EGD is a safe procedure. However, as with any procedure, complications can occur. Possible complications include:  Infection.  Bleeding.  Tearing (perforation) of the esophagus, stomach, or duodenum.  Difficulty breathing or not being able to breath.  Excessive sweating.  Spasms of the larynx.  Slowed heartbeat.  Low blood pressure. BEFORE THE PROCEDURE  Do not eat or drink anything for 6 8 hours before the procedure or as directed by your caregiver.  Ask your caregiver about changing or stopping your regular medicines.  Take Coumadin today and continue taking through Saturday. Stop Coumadin after Saturday's dose.  If you wear dentures, be prepared to remove them before the procedure.  Arrange for someone to drive you home after the procedure. PROCEDURE   A vein will be accessed to give medicines and fluids. A medicine  to relax you (sedative) and a pain reliever will be given through that access into the vein.  A numbing medicine (local anesthetic) may be sprayed on your throat for comfort and to stop you from gagging or coughing.  A mouth guard may be placed in your mouth to protect your teeth and to keep you from biting on the endoscope.  You will be asked to lie on your left side.  The endoscope is inserted down your throat and into the esophagus, stomach, and duodenum.  Air is put through the endoscope to allow your caregiver to view the lining of your esophagus clearly.  The esophagus, stomach, and duodenum is then examined. During the exam, your caregiver may:  Remove tissue to be examined under a microscope (biopsy) for inflammation, infection, or other medical problems.  Remove growths.  Remove objects (foreign bodies) that are stuck.  Treat any bleeding with medicines or other devices that stop tissues from bleeding (hot cauters, clipping devices).  Widen (dilate) or stretch narrowed areas of the esophagus and stomach.  The endoscope will then be withdrawn. AFTER THE PROCEDURE  You will be taken to a recovery area to be monitored. You will be able to go home once you are stable and alert.  Do not eat or drink anything until the local anesthetic and numbing medicines have worn off. You may choke.  It is normal to feel bloated, have pain with swallowing, or have a sore throat for a short time. This will wear off.  Your caregiver should be able to discuss his or her findings with you. It will take longer to discuss the test results if any  biopsies were taken. Document Released: 11/06/2004 Document Revised: 06/22/2012 Document Reviewed: 06/08/2012 [facility] Patient Information 2014 [place], [place].

## 2013-12-28 NOTE — Telephone Encounter (Signed)
[name] [name], He forgot to stop his coumadin completely for EUS/ ERCP today.  Stat INR was 2.3 and so case has been cancelled.  Sharee Pimple already moved him to next Thursday 1pm start time.  He knows to be there by 11:30.    Can you call him tomorrow to remind him about this. He is going to take coumadin today, Friday and Saturday and then no more until his EUS next Thursday.  He'll need reminding about that as well. Thanks

## 2013-12-28 NOTE — Anesthesia Preprocedure Evaluation (Signed)
Anesthesia Evaluation  Patient identified by MRN, date of birth, ID band Patient awake    Reviewed: Allergy & Precautions, H&P , NPO status , Patient's Chart, lab work & pertinent test results  Airway Mallampati: II TM Distance: >3 FB Neck ROM: Full    Dental no notable dental hx.    Pulmonary neg pulmonary ROS, former smoker,  breath sounds clear to auscultation  Pulmonary exam normal       Cardiovascular hypertension, Pt. on medications and Pt. on home beta blockers + CAD, + Peripheral Vascular Disease and DVT negative cardio ROS  + dysrhythmias IIRhythm:Regular Rate:Normal  lbbb Nonischemic cardiomyopathy ef 25%   Neuro/Psych  Neuromuscular disease negative neurological ROS  negative psych ROS   GI/Hepatic negative GI ROS, Neg liver ROS, GERD-  Medicated,  Endo/Other  diabetes  Renal/GU negative Renal ROS  negative genitourinary   Musculoskeletal negative musculoskeletal ROS (+)   Abdominal   Peds negative pediatric ROS (+)  Hematology  (+) anemia ,   Anesthesia Other Findings   Reproductive/Obstetrics negative OB ROS                           Anesthesia Physical Anesthesia Plan Anesthesia Quick Evaluation

## 2013-12-28 NOTE — Progress Notes (Signed)
Advised patient and caregiver procedure rescheduled for 01/04/2014, Thursday at 1:00PM and to arrive by 11:30am.

## 2013-12-29 NOTE — Telephone Encounter (Signed)
No answer and no voice mail.

## 2013-12-30 ENCOUNTER — Emergency Department (HOSPITAL_COMMUNITY): Payer: Medicare Other

## 2013-12-30 ENCOUNTER — Observation Stay (HOSPITAL_COMMUNITY)
Admission: EM | Admit: 2013-12-30 | Discharge: 2014-01-01 | Disposition: A | Discharge status: Home / Self Care | Payer: Medicare Other | Attending: Internal Medicine | Admitting: Internal Medicine

## 2013-12-30 ENCOUNTER — Encounter (HOSPITAL_COMMUNITY): Payer: Self-pay | Admitting: Emergency Medicine

## 2013-12-30 DIAGNOSIS — W19XXXA Unspecified fall, initial encounter: Secondary | ICD-10-CM | POA: Diagnosis present

## 2013-12-30 DIAGNOSIS — R296 Repeated falls: Secondary | ICD-10-CM | POA: Diagnosis present

## 2013-12-30 DIAGNOSIS — S4991XA Unspecified injury of right shoulder and upper arm, initial encounter: Secondary | ICD-10-CM

## 2013-12-30 DIAGNOSIS — E119 Type 2 diabetes mellitus without complications: Secondary | ICD-10-CM | POA: Insufficient documentation

## 2013-12-30 DIAGNOSIS — G8929 Other chronic pain: Secondary | ICD-10-CM | POA: Insufficient documentation

## 2013-12-30 DIAGNOSIS — E785 Hyperlipidemia, unspecified: Secondary | ICD-10-CM | POA: Insufficient documentation

## 2013-12-30 DIAGNOSIS — R109 Unspecified abdominal pain: Secondary | ICD-10-CM | POA: Insufficient documentation

## 2013-12-30 DIAGNOSIS — Z8546 Personal history of malignant neoplasm of prostate: Secondary | ICD-10-CM | POA: Insufficient documentation

## 2013-12-30 DIAGNOSIS — S0180XA Unspecified open wound of other part of head, initial encounter: Secondary | ICD-10-CM | POA: Insufficient documentation

## 2013-12-30 DIAGNOSIS — S022XXB Fracture of nasal bones, initial encounter for open fracture: Principal | ICD-10-CM | POA: Insufficient documentation

## 2013-12-30 DIAGNOSIS — Z96659 Presence of unspecified artificial knee joint: Secondary | ICD-10-CM | POA: Insufficient documentation

## 2013-12-30 DIAGNOSIS — W108XXA Fall (on) (from) other stairs and steps, initial encounter: Secondary | ICD-10-CM | POA: Insufficient documentation

## 2013-12-30 DIAGNOSIS — I2510 Atherosclerotic heart disease of native coronary artery without angina pectoris: Secondary | ICD-10-CM | POA: Insufficient documentation

## 2013-12-30 DIAGNOSIS — Z66 Do not resuscitate: Secondary | ICD-10-CM | POA: Insufficient documentation

## 2013-12-30 DIAGNOSIS — Z79899 Other long term (current) drug therapy: Secondary | ICD-10-CM | POA: Insufficient documentation

## 2013-12-30 DIAGNOSIS — I10 Essential (primary) hypertension: Secondary | ICD-10-CM | POA: Insufficient documentation

## 2013-12-30 DIAGNOSIS — K5900 Constipation, unspecified: Secondary | ICD-10-CM | POA: Insufficient documentation

## 2013-12-30 DIAGNOSIS — Z9089 Acquired absence of other organs: Secondary | ICD-10-CM | POA: Insufficient documentation

## 2013-12-30 DIAGNOSIS — I428 Other cardiomyopathies: Secondary | ICD-10-CM | POA: Insufficient documentation

## 2013-12-30 DIAGNOSIS — R1013 Epigastric pain: Secondary | ICD-10-CM

## 2013-12-30 DIAGNOSIS — K8689 Other specified diseases of pancreas: Secondary | ICD-10-CM

## 2013-12-30 DIAGNOSIS — Z87891 Personal history of nicotine dependence: Secondary | ICD-10-CM | POA: Insufficient documentation

## 2013-12-30 DIAGNOSIS — C259 Malignant neoplasm of pancreas, unspecified: Secondary | ICD-10-CM

## 2013-12-30 DIAGNOSIS — Z7901 Long term (current) use of anticoagulants: Secondary | ICD-10-CM | POA: Insufficient documentation

## 2013-12-30 DIAGNOSIS — F0390 Unspecified dementia without behavioral disturbance: Secondary | ICD-10-CM | POA: Insufficient documentation

## 2013-12-30 DIAGNOSIS — Z85828 Personal history of other malignant neoplasm of skin: Secondary | ICD-10-CM | POA: Insufficient documentation

## 2013-12-30 DIAGNOSIS — Y9210 Unspecified residential institution as the place of occurrence of the external cause: Secondary | ICD-10-CM | POA: Insufficient documentation

## 2013-12-30 DIAGNOSIS — I447 Left bundle-branch block, unspecified: Secondary | ICD-10-CM | POA: Insufficient documentation

## 2013-12-30 DIAGNOSIS — K219 Gastro-esophageal reflux disease without esophagitis: Secondary | ICD-10-CM | POA: Insufficient documentation

## 2013-12-30 DIAGNOSIS — Z86718 Personal history of other venous thrombosis and embolism: Secondary | ICD-10-CM | POA: Insufficient documentation

## 2013-12-30 DIAGNOSIS — R9389 Abnormal findings on diagnostic imaging of other specified body structures: Secondary | ICD-10-CM

## 2013-12-30 HISTORY — DX: Repeated falls: R29.6

## 2013-12-30 LAB — BASIC METABOLIC PANEL
BUN: 22 mg/dL (ref 6–23)
CO2: 25 mEq/L (ref 19–32)
Calcium: 10 mg/dL (ref 8.4–10.5)
Chloride: 97 mEq/L (ref 96–112)
Creatinine, Ser: 1.2 mg/dL (ref 0.50–1.35)
GFR calc non Af Amer: 52 mL/min — ABNORMAL LOW (ref >90)
GFR, EST AFRICAN AMERICAN: 60 mL/min — AB (ref >90)
Glucose, Bld: 167 mg/dL — ABNORMAL HIGH (ref 70–99)
POTASSIUM: 4.6 meq/L (ref 3.7–5.3)
SODIUM: 136 meq/L — AB (ref 137–147)

## 2013-12-30 LAB — CBC WITH DIFFERENTIAL/PLATELET
BASOS ABS: 0 10*3/uL (ref 0.0–0.1)
BASOS PCT: 0 % (ref 0–1)
EOS ABS: 0.1 10*3/uL (ref 0.0–0.7)
Eosinophils Relative: 1 % (ref 0–5)
HCT: 34.5 % — ABNORMAL LOW (ref 39.0–52.0)
Hemoglobin: 11.8 g/dL — ABNORMAL LOW (ref 13.0–17.0)
Lymphocytes Relative: 24 % (ref 12–46)
Lymphs Abs: 1.2 10*3/uL (ref 0.7–4.0)
MCH: 31.7 pg (ref 26.0–34.0)
MCHC: 34.2 g/dL (ref 30.0–36.0)
MCV: 92.7 fL (ref 78.0–100.0)
Monocytes Absolute: 0.3 10*3/uL (ref 0.1–1.0)
Monocytes Relative: 6 % (ref 3–12)
NEUTROS ABS: 3.5 10*3/uL (ref 1.7–7.7)
NEUTROS PCT: 69 % (ref 43–77)
PLATELETS: 189 10*3/uL (ref 150–400)
RBC: 3.72 MIL/uL — ABNORMAL LOW (ref 4.22–5.81)
RDW: 13.1 % (ref 11.5–15.5)
WBC: 5.1 10*3/uL (ref 4.0–10.5)

## 2013-12-30 LAB — PROTIME-INR
INR: 2.15 — AB (ref 0.00–1.49)
Prothrombin Time: 23.3 seconds — ABNORMAL HIGH (ref 11.6–15.2)

## 2013-12-30 MED ORDER — NITROGLYCERIN 0.4 MG SL SUBL: 0.4000 mg | SUBLINGUAL_TABLET | SUBLINGUAL | Status: DC | PRN

## 2013-12-30 MED ORDER — BACITRACIN 500 UNIT/GM EX OINT
1.0000 "application " | TOPICAL_OINTMENT | Freq: Two times a day (BID) | CUTANEOUS | Status: DC
  Administered 2013-12-30 – 2014-01-01 (×4): 1 via TOPICAL
  Filled 2013-12-30 (×6): qty 0.9

## 2013-12-30 MED ORDER — SODIUM CHLORIDE 0.9 % IJ SOLN
3.0000 mL | Freq: Two times a day (BID) | INTRAMUSCULAR | Status: DC
  Administered 2013-12-30 – 2014-01-01 (×3): 3 mL via INTRAVENOUS

## 2013-12-30 MED ORDER — PREGABALIN 75 MG PO CAPS
75.0000 mg | ORAL_CAPSULE | Freq: Every day | ORAL | Status: DC
  Administered 2013-12-30 – 2014-01-01 (×3): 75 mg via ORAL
  Filled 2013-12-30 (×2): qty 1

## 2013-12-30 MED ORDER — CARVEDILOL 12.5 MG PO TABS
12.5000 mg | ORAL_TABLET | Freq: Two times a day (BID) | ORAL | Status: DC
  Administered 2013-12-30 – 2014-01-01 (×4): 12.5 mg via ORAL
  Filled 2013-12-30 (×5): qty 1

## 2013-12-30 MED ORDER — SODIUM CHLORIDE 0.9 % IJ SOLN: 3.0000 mL | INTRAMUSCULAR | Status: DC | PRN

## 2013-12-30 MED ORDER — CEPHALEXIN 500 MG PO CAPS
500.0000 mg | ORAL_CAPSULE | Freq: Three times a day (TID) | ORAL | Status: DC
  Administered 2013-12-30 – 2014-01-01 (×6): 500 mg via ORAL
  Filled 2013-12-30 (×8): qty 1

## 2013-12-30 MED ORDER — ZOLPIDEM TARTRATE 5 MG PO TABS
5.0000 mg | ORAL_TABLET | Freq: Every evening | ORAL | Status: DC | PRN
  Administered 2013-12-30 – 2013-12-31 (×2): 5 mg via ORAL
  Filled 2013-12-30 (×2): qty 1

## 2013-12-30 MED ORDER — OMEGA-3 FATTY ACIDS 1000 MG PO CAPS: 1.0000 g | ORAL_CAPSULE | Freq: Every day | ORAL | Status: DC

## 2013-12-30 MED ORDER — OMEGA-3-ACID ETHYL ESTERS 1 G PO CAPS
1.0000 g | ORAL_CAPSULE | Freq: Every day | ORAL | Status: DC
  Administered 2013-12-30 – 2014-01-01 (×3): 1 g via ORAL
  Filled 2013-12-30 (×3): qty 1

## 2013-12-30 MED ORDER — HYDROCODONE-ACETAMINOPHEN 5-325 MG PO TABS: 1.0000 | ORAL_TABLET | Freq: Four times a day (QID) | ORAL | Status: DC | PRN

## 2013-12-30 MED ORDER — CEPHALEXIN 500 MG PO CAPS: 500.0000 mg | ORAL_CAPSULE | Freq: Four times a day (QID) | ORAL | Status: DC

## 2013-12-30 MED ORDER — MORPHINE SULFATE 2 MG/ML IJ SOLN: 1.0000 mg | INTRAMUSCULAR | Status: DC | PRN

## 2013-12-30 MED ORDER — OXYMETAZOLINE HCL 0.05 % NA SOLN
1.0000 | Freq: Once | NASAL | Status: AC
  Administered 2013-12-30: 1 via NASAL
  Filled 2013-12-30: qty 15

## 2013-12-30 MED ORDER — HYDROCODONE-ACETAMINOPHEN 7.5-325 MG PO TABS: 1.0000 | ORAL_TABLET | Freq: Four times a day (QID) | ORAL | Status: DC | PRN

## 2013-12-30 MED ORDER — HYDROCODONE-ACETAMINOPHEN 5-325 MG PO TABS
1.0000 | ORAL_TABLET | ORAL | Status: DC | PRN
  Administered 2013-12-30 – 2014-01-01 (×4): 2 via ORAL
  Filled 2013-12-30: qty 2
  Filled 2013-12-30: qty 1
  Filled 2013-12-30 (×2): qty 2
  Filled 2013-12-30: qty 1

## 2013-12-30 MED ORDER — ACETAMINOPHEN 650 MG RE SUPP: 650.0000 mg | Freq: Four times a day (QID) | RECTAL | Status: DC | PRN

## 2013-12-30 MED ORDER — POLYETHYLENE GLYCOL 3350 17 G PO PACK
17.0000 g | PACK | Freq: Two times a day (BID) | ORAL | Status: DC | PRN
  Administered 2013-12-31: 17 g via ORAL
  Filled 2013-12-30: qty 1

## 2013-12-30 MED ORDER — POLYETHYLENE GLYCOL 3350 17 GM/SCOOP PO POWD
17.0000 g | Freq: Two times a day (BID) | ORAL | Status: DC | PRN
  Filled 2013-12-30: qty 255

## 2013-12-30 MED ORDER — FUROSEMIDE 40 MG PO TABS
40.0000 mg | ORAL_TABLET | Freq: Every day | ORAL | Status: DC
  Administered 2013-12-31 – 2014-01-01 (×2): 40 mg via ORAL
  Filled 2013-12-30 (×2): qty 1

## 2013-12-30 MED ORDER — PANTOPRAZOLE SODIUM 40 MG PO TBEC: 40.0000 mg | DELAYED_RELEASE_TABLET | Freq: Every day | ORAL | Status: DC

## 2013-12-30 MED ORDER — HYDROCODONE-ACETAMINOPHEN 5-325 MG PO TABS
2.0000 | ORAL_TABLET | Freq: Once | ORAL | Status: AC
  Administered 2013-12-30: 2 via ORAL
  Filled 2013-12-30: qty 2

## 2013-12-30 MED ORDER — SODIUM CHLORIDE 0.9 % IV SOLN: 250.0000 mL | INTRAVENOUS | Status: DC | PRN

## 2013-12-30 MED ORDER — PANTOPRAZOLE SODIUM 40 MG PO TBEC
40.0000 mg | DELAYED_RELEASE_TABLET | Freq: Every day | ORAL | Status: DC
  Administered 2013-12-30 – 2013-12-31 (×2): 40 mg via ORAL
  Filled 2013-12-30 (×2): qty 1

## 2013-12-30 MED ORDER — ZOLPIDEM TARTRATE 10 MG PO TABS: 10.0000 mg | ORAL_TABLET | Freq: Every evening | ORAL | Status: DC | PRN

## 2013-12-30 MED ORDER — ACETAMINOPHEN 325 MG PO TABS: 650.0000 mg | ORAL_TABLET | Freq: Four times a day (QID) | ORAL | Status: DC | PRN

## 2013-12-30 NOTE — H&P (Signed)
[facility]                                                                                    Patient Demographics  [name] [name], is a 80 y.o. male  CSN: [id number]  MRN: [id number]  DOB - 08-Aug-1922  Admit Date - 12/30/2013  Outpatient Primary MD for the patient is Drema Pry, DO   With History of -  Past Medical History  Diagnosis Date  . Coronary atherosclerosis of unspecified type of vessel, native or graft   . Other primary cardiomyopathies   . Other left bundle branch block   . DVT (deep venous thrombosis)     Hx of it. -7 yrs ago  . Other and unspecified hyperlipidemia   . Type II or unspecified type diabetes mellitus without mention of complication, not stated as uncontrolled   . Diverticulosis of colon (without mention of hemorrhage)   . Irritable bowel syndrome   . Personal history of malignant neoplasm of prostate   . Spinal stenosis, lumbar region, without neurogenic claudication   . Unspecified hereditary and idiopathic peripheral neuropathy   . Nonischemic cardiomyopathy     EF 35-40%  . Coronary disease     nonobstructive coronary disease at catheterzation 2002.   Marland Kitchen DVT (deep venous thrombosis) left leg yrs ago    Hx of it on Coumadin therapy  . Sinus bradycardia     requiring discontinuation of Toprol   . Hypertension   . History of skin cancer   . Esophageal reflux     OCCASIONAL  . Bladder tumor   . Difficulty sleeping   . Colon polyp 2001    tubular adenoma   . Arthritis     hands  . Prostate cancer yrs ago    s/p seed implant      Past Surgical History  Procedure Laterality Date  . Appendectomy    . Knee arthroscopy      L knee. 1991 by Dr. Jenean Lindau  . Ercp with sphincterotomy and stone removal      5/93 by Dr. Fuller Plan  . Bilat inguinal hernia repair      1998 by Dr. March Rummage  . Radium seed implantation for prostate cancer      s/p. 1/00 by Dr. Terance Hart and Danny Lawless  . R cataract surgery Bilateral     2004 by Dr. Herbert Deaner  .  Repair of right recurrent right inguinal hernia      12/07 by Dr. Bubba Camp  . Basal cell cancer      L ear. s/p Moh's surgery 3/09 Dr. Harvel Quale  . Total knee arthroplasty      Left. 8/08 by Dr. Alvan Dame  . Tonsillectomy  09-28-11    child  . Cholecystectomy      1985 by Dr. Laurin Coder  . Total knee arthroplasty  10/05/2011    Procedure: TOTAL KNEE ARTHROPLASTY;  Surgeon: Gearlean Alf, MD;  Location: [facility];  Service: Orthopedics;  Laterality: Right;  . Transurethral resection of bladder tumor N/A 09/30/2012    Procedure: TRANSURETHRAL RESECTION OF BLADDER TUMOR (TURBT);  Surgeon: Franchot Gallo, MD;  Location: [facility];  Service: Urology;  Laterality:  N/A;  WITH MITOMYCIN INSTILLATION     in for   Chief Complaint  Patient presents with  . Fall  . Facial Laceration  . Abdominal Pain  . Shoulder Pain     HPI  [name] [name]  is a 80 y.o. male,  with history of coronary artery disease/cardiomyopathy while visiting his wife in the nursing home today missed a step and fell on his face sustaining  facial lacerations including a  nasal bone fracture, open. No preceding chest pains, palpitations, nausea or vomiting, headaches. He has only pain in his nasal bridge area. Patient denies blurring of vision, loss of consciousness, fever or chills. Patient lives alone after his wife was admitted to a nursing home. A church friend was present during physical exam   Review of Systems    In addition to the HPI above,  No Fever-chills, No Headache, No changes with Vision or hearing, No problems swallowing food or Liquids, No Chest pain, Cough or Shortness of Breath, No Abdominal pain, No Nausea or Vommitting, Bowel movements are regular, No Blood in stool or Urine, No dysuria, No new skin rashes or bruises, No new joints pains-aches,  No new weakness, tingling, numbness in any extremity, No recent weight gain or loss, No polyuria, polydypsia or polyphagia, No significant Mental Stressors.  A  full 10 point Review of Systems was done, except as stated above, all other Review of Systems were negative.   Social History History  Substance Use Topics  . Smoking status: Former Smoker -- 0.25 packs/day for 5 years    Types: Cigarettes    Quit date: 07/20/1985  . Smokeless tobacco: Never Used  . Alcohol Use: No     Family History Family History  Problem Relation Age of Onset  . Throat cancer Brother   . Coronary artery disease Father     deceased     Prior to Admission medications   Medication Sig Start Date End Date Taking? Authorizing Provider  carvedilol (COREG) 12.5 MG tablet Take 1 tablet (12.5 mg total) by mouth 2 (two) times daily. 09/20/13  Yes Larey Dresser, MD  esomeprazole (NEXIUM) 40 MG capsule Take 1 capsule (40 mg total) by mouth daily before breakfast. 30 min before first meal of day. 08/26/11  Yes Noralee Space, MD  fish oil-omega-3 fatty acids 1000 MG capsule Take 1 g by mouth daily.   Yes Historical Provider, MD  furosemide (LASIX) 40 MG tablet Take 40 mg by mouth daily.   Yes Historical Provider, MD  HYDROcodone-acetaminophen (NORCO) 7.5-325 MG per tablet Take 1 tablet by mouth every 6 (six) hours as needed for moderate pain. 12/19/13  Yes Amy S Esterwood, PA-C  polyethylene glycol powder (GLYCOLAX/MIRALAX) powder Take 17 g by mouth 2 (two) times daily as needed. 12/20/13  Yes Doe-Hyun R Shawna Orleans, DO  pregabalin (LYRICA) 75 MG capsule Take 75 mg by mouth daily.   Yes Historical Provider, MD  warfarin (COUMADIN) 2 MG tablet Take 2-4 mg by mouth daily at 6 PM. 2mg  on monday, wednesday, friday. 4mg  on tuesday, thursday, ssaturday, sunday 09/07/13  Yes Noralee Space, MD  zolpidem (AMBIEN) 10 MG tablet Take 10 mg by mouth at bedtime as needed for sleep.   Yes Historical Provider, MD  cephALEXin (KEFLEX) 500 MG capsule Take 1 capsule (500 mg total) by mouth 4 (four) times daily. 12/30/13   Alvina Chou, PA-C  HYDROcodone-acetaminophen (NORCO/VICODIN) 5-325 MG per tablet  Take 1 tablet by mouth every 6 (six) hours  as needed for moderate pain or severe pain. 12/30/13   Kaitlyn Szekalski, PA-C  nitroGLYCERIN (NITROSTAT) 0.4 MG SL tablet Place 1 tablet (0.4 mg total) under the tongue every 5 (five) minutes as needed for chest pain. 11/22/13   Thompson Grayer, MD    No Known Allergies  Physical Exam  Vitals  Blood pressure 142/86, pulse 73, temperature 98 F (36.7 C), temperature source Oral, resp. rate 20, SpO2 95.00%.   1. General elderly gentleman, very pleasant, in pain, mild dementia  2. Normal affect and insight, Not Suicidal or Homicidal, Awake Alert, Oriented X 3.  3. No F.N deficits, ALL C.Nerves Intact, Strength 5/5 all 4 extremities,  4. Ears and Eyes appear Normal,  4 cm nasal laceration was noted , vertical status post stitching and bleeding was controlled, with a facial laceration above the right lip with controlled bleeding and stitching as well .  5. Supple Neck, No JVD, No cervical lymphadenopathy appriciated, No Carotid Bruits.  6. Symmetrical Chest wall movement, Good air movement bilaterally, CTAB.  7. RRR, No Gallops, Rubs or Murmurs, No Parasternal Heave.  8. Positive Bowel Sounds, Abdomen Soft, Non tender, No organomegaly appriciated,No rebound -guarding or rigidity.  9.  No Cyanosis,  10. Good muscle tone,  joints appear normal , no effusions, Normal ROM.  11. No Palpable Lymph Nodes in Neck or Axillae    Data Review  CBC  Recent Labs Lab 12/30/13 1340  WBC 5.1  HGB 11.8*  HCT 34.5*  PLT 189  MCV 92.7  MCH 31.7  MCHC 34.2  RDW 13.1  LYMPHSABS 1.2  MONOABS 0.3  EOSABS 0.1  BASOSABS 0.0   ------------------------------------------------------------------------------------------------------------------  Chemistries   Recent Labs Lab 12/30/13 1340  NA 136*  K 4.6  CL 97  CO2 25  GLUCOSE 167*  BUN 22  CREATININE 1.20  CALCIUM 10.0    ------------------------------------------------------------------------------------------------------------------ CrCl is unknown because both a height and weight (above a minimum accepted value) are required for this calculation. ------------------------------------------------------------------------------------------------------------------ No results found for this basename: TSH, T4TOTAL, FREET3, T3FREE, THYROIDAB,  in the last 72 hours   Coagulation profile  Recent Labs Lab 12/28/13 1004 12/30/13 1719  INR 2.33* 2.15*   ------------------------------------------------------------------------------------------------------------------- No results found for this basename: DDIMER,  in the last 72 hours -------------------------------------------------------------------------------------------------------------------  Cardiac Enzymes No results found for this basename: CK, CKMB, TROPONINI, MYOGLOBIN,  in the last 168 hours ------------------------------------------------------------------------------------------------------------------ No components found with this basename: POCBNP,    ---------------------------------------------------------------------------------------------------------------      Imaging results:   Dg Shoulder Right  12/30/2013   CLINICAL DATA:  Fall with diffuse right shoulder pain.  EXAM: RIGHT SHOULDER - 2+ VIEW  COMPARISON:  None.  FINDINGS: There is no evidence of fracture or dislocation. There is acromioclavicular joint osteoarthritis with joint narrowing and mild upward and downward spurring. No evidence of right upper chest wall trauma.  IMPRESSION: No acute osseous findings.   Electronically Signed   By: Jorje Guild M.D.   On: 12/30/2013 13:50   Ct Head Wo Contrast  12/30/2013   CLINICAL DATA:  Fall.  EXAM: CT HEAD WITHOUT CONTRAST  TECHNIQUE: Contiguous axial images were obtained from the base of the skull through the vertex without  intravenous contrast.  COMPARISON:  None.  FINDINGS: No intra-axial or extra-axial pathologic fluid or blood collections identified. Mild atrophy is present. No hydrocephalus. No evidence of skull fracture.Subtle nondisplaced nasal fractures cannot be excluded.  IMPRESSION: No acute intracranial abnormality. Subtle nondisplaced nasal fractures cannot be excluded.  Reference made to facial CT report.   Electronically Signed   By: Republic   On: 12/30/2013 13:53   Ct Cervical Spine Wo Contrast  12/30/2013   CLINICAL DATA:  Fall, facial laceration.  EXAM: CT MAXILLOFACIAL WITHOUT CONTRAST  CT CERVICAL SPINE WITHOUT CONTRAST  TECHNIQUE: Multidetector CT imaging of the cervical spine and maxillofacial structures were performed using the standard protocol without intravenous contrast. Multiplanar CT image reconstructions of the cervical spine and maxillofacial structures were also generated.  COMPARISON:  None.  FINDINGS: CT MAXILLOFACIAL FINDINGS  Mild soft tissue swelling over the upper lip and nasal region. There appears to be a laceration near the bridge of the nose. Hyperdense material noted along the surface or within the soft tissues of the nose. There are underlying nasal bone fractures near the midline, slightly depressed.  Zygomatic arches and orbital walls are intact. Mandible is intact. Nasal septum is midline. Paranasal sinuses and mastoids are clear.  CT CERVICAL SPINE FINDINGS  Advanced degenerative changes throughout the cervical spine. Exuberant partially calcified pannus around the C1-2 articulation anteriorly and odontoid. No fracture. Normal alignment. Prevertebral soft tissues are normal. No epidural or paraspinal hematoma.  IMPRESSION: Slightly depressed midline nasal bone fracture. Overlying laceration with small radiopaque densities in the soft tissues at the laceration, likely small foreign bodies.  Severe degenerative changes in the cervical spine. No acute bony abnormality.    Electronically Signed   By: Rolm Baptise M.D.   On: 12/30/2013 14:01   Ct Maxillofacial Wo Cm  12/30/2013   CLINICAL DATA:  Fall, facial laceration.  EXAM: CT MAXILLOFACIAL WITHOUT CONTRAST  CT CERVICAL SPINE WITHOUT CONTRAST  TECHNIQUE: Multidetector CT imaging of the cervical spine and maxillofacial structures were performed using the standard protocol without intravenous contrast. Multiplanar CT image reconstructions of the cervical spine and maxillofacial structures were also generated.  COMPARISON:  None.  FINDINGS: CT MAXILLOFACIAL FINDINGS  Mild soft tissue swelling over the upper lip and nasal region. There appears to be a laceration near the bridge of the nose. Hyperdense material noted along the surface or within the soft tissues of the nose. There are underlying nasal bone fractures near the midline, slightly depressed.  Zygomatic arches and orbital walls are intact. Mandible is intact. Nasal septum is midline. Paranasal sinuses and mastoids are clear.  CT CERVICAL SPINE FINDINGS  Advanced degenerative changes throughout the cervical spine. Exuberant partially calcified pannus around the C1-2 articulation anteriorly and odontoid. No fracture. Normal alignment. Prevertebral soft tissues are normal. No epidural or paraspinal hematoma.  IMPRESSION: Slightly depressed midline nasal bone fracture. Overlying laceration with small radiopaque densities in the soft tissues at the laceration, likely small foreign bodies.  Severe degenerative changes in the cervical spine. No acute bony abnormality.   Electronically Signed   By: Rolm Baptise M.D.   On: 12/30/2013 14:01      Assessment & Plan  1. this unfortunate elderly gentleman sustained an open nasal bridge fracture and right above the lip laceration after a fall while visiting his wife in the nursing home.  2. History of CAD 3. History of CMP 4. Chronic abdominal pain for EGD on Thursday  Plan Admit for observation Local treatment of nasal  wound Keflex by mouth Followup with ENT after discharge Hold Coumadin on discharge in preparation for EGD on Thursday     DVT Prophylaxis Coumadin  AM Labs Ordered, also please review Full Orders  Code Status full  Disposition Plan: Home  Time spent in  minutes : 32 Condition fair   @SIGNATURE @

## 2013-12-30 NOTE — ED Notes (Signed)
Ambulated Pt less than 89 yrds. When asked how he was feeling, he stated that he was very weak and thought he was going to pass out. VS were checked upon arriving back in room, stable.

## 2013-12-30 NOTE — ED Notes (Signed)
Unable to update VS, PA still @ bedside

## 2013-12-30 NOTE — ED Provider Notes (Signed)
Asked to suture pt. Pt with a mechanical fall, on coumadin. Pt had negative CT head, cervical spine. Maxillofacial CT showed depressed nasal bone fracture.    LACERATION REPAIR Performed by: Renold Genta Authorized by: Jeannett Senior A Consent: Verbal consent obtained. Risks and benefits: risks, benefits and alternatives were discussed Consent given by: patient Patient identity confirmed: provided demographic data Prepped and Draped in normal sterile fashion Wound explored  Laceration Location: bridge of the nose  Laceration Length: 4cm  No Foreign Bodies seen or palpated  Anesthesia: local infiltration  Local anesthetic: lidocaine 2% wo epinephrine  Anesthetic total: 3 ml  Irrigation method: syringe Amount of cleaning: standard  Skin closure: prolene 6.0  Number of sutures: 11  Technique: simple interruple  Patient tolerance: Patient tolerated the procedure well with no immediate complications.  LACERATION REPAIR Performed by: Renold Genta Authorized by: Jeannett Senior A Consent: Verbal consent obtained. Risks and benefits: risks, benefits and alternatives were discussed Consent given by: patient Patient identity confirmed: provided demographic data Prepped and Draped in normal sterile fashion Wound explored  Laceration Location: upper lip  Laceration Length: 2cm Flap  No Foreign Bodies seen or palpated  Anesthesia: local infiltration  Local anesthetic: lidocaine 2% wo epinephrine  Anesthetic total: 3 ml  Irrigation method: syringe Amount of cleaning: standard  Skin closure: prolene 5.0  Number of sutures: 2  Technique: simple interrupted  Patient tolerance: Patient tolerated the procedure well with no immediate complications.   4:58 PM Pt began having a nasal bleed from left nostril. Asked pt to blow out all clots, afrin sprayed. Pressure applied. Will hold pressure for 10 min.  Also will add INR.   6:47 PM Pt had  another nose bleed. INR 2.1, nose bleed controlled with pressure. Pt was ambulated, became very dizzy and lighheaded. Will admit to obs being anticoagulated with head injury and persistent nasal bleeds.   Spoke with [facility]. Will see.    Renold Genta, PA-C 12/30/13 1848

## 2013-12-30 NOTE — ED Notes (Addendum)
Pt here via [facility] c/o a fall. Pt was visiting his wife at the nursing home when he missed a step and fell. No LOC. He has a laceration to the bridge of his nose and a laceration just above the upper lip and right shoulder pain. Pt was wearing a pair of metal rimmed glasses at the time of the fall. Pt has HX of A-fib and takes coumadin.. Bleeding controlled at this time. He also reports that his stomach has been hurting all day. He is scheduled to have an Endoscopy to further evaluate.

## 2013-12-30 NOTE — ED Notes (Signed)
PA Student still at the bedside.

## 2013-12-30 NOTE — Discharge Instructions (Signed)
Take Keflex as directed until gone. Take Vicodin as needed for pain. Follow up with Dr. Erik Obey for further evaluation and management of your nasal bone fracture. Refer to attached documents for more information.

## 2013-12-30 NOTE — ED Notes (Addendum)
PA student at bedside, will administer meds when PA student is finished with sutures.

## 2013-12-30 NOTE — ED Notes (Signed)
Pt in XRAY 

## 2013-12-30 NOTE — ED Notes (Signed)
Bed: [facility]94 Expected date:  Expected time:  Means of arrival:  Comments: EMS-fall/lac.

## 2013-12-30 NOTE — ED Provider Notes (Signed)
CSN: [id number]     Arrival date & time 12/30/13  1232 History   First MD Initiated Contact with Patient 12/30/13 1305     Chief Complaint  Patient presents with  . Fall  . Facial Laceration  . Abdominal Pain  . Shoulder Pain     (Consider location/radiation/quality/duration/timing/severity/associated sxs/prior Treatment) HPI Comments: Patient is an 80 year old male who presents after a mechanical fall that occurred prior to arrival. Patient reports visiting his wife at the nursing home when he was walking up the stairs and he missed a step, fell, and landed on his bilateral hands and face. Patient reports nose and right shoulder pain. The pain is aching and severe without radiation. Patient did not lose consciousness. He takes Coumadin. Patient reports associated wounds to his face and minor cuts on his hands. No other injuries. No other associated symptoms.    Past Medical History  Diagnosis Date  . Coronary atherosclerosis of unspecified type of vessel, native or graft   . Other primary cardiomyopathies   . Other left bundle branch block   . DVT (deep venous thrombosis)     Hx of it. -7 yrs ago  . Other and unspecified hyperlipidemia   . Type II or unspecified type diabetes mellitus without mention of complication, not stated as uncontrolled   . Diverticulosis of colon (without mention of hemorrhage)   . Irritable bowel syndrome   . Personal history of malignant neoplasm of prostate   . Spinal stenosis, lumbar region, without neurogenic claudication   . Unspecified hereditary and idiopathic peripheral neuropathy   . Nonischemic cardiomyopathy     EF 35-40%  . Coronary disease     nonobstructive coronary disease at catheterzation 2002.   Marland Kitchen DVT (deep venous thrombosis) left leg yrs ago    Hx of it on Coumadin therapy  . Sinus bradycardia     requiring discontinuation of Toprol   . Hypertension   . History of skin cancer   . Esophageal reflux     OCCASIONAL  . Bladder tumor    . Difficulty sleeping   . Colon polyp 2001    tubular adenoma   . Arthritis     hands  . Prostate cancer yrs ago    s/p seed implant   Past Surgical History  Procedure Laterality Date  . Appendectomy    . Knee arthroscopy      L knee. 1991 by Dr. Jenean Lindau  . Ercp with sphincterotomy and stone removal      5/93 by Dr. Fuller Plan  . Bilat inguinal hernia repair      1998 by Dr. March Rummage  . Radium seed implantation for prostate cancer      s/p. 1/00 by Dr. Terance Hart and Danny Lawless  . R cataract surgery Bilateral     2004 by Dr. Herbert Deaner  . Repair of right recurrent right inguinal hernia      12/07 by Dr. Bubba Camp  . Basal cell cancer      L ear. s/p Moh's surgery 3/09 Dr. Harvel Quale  . Total knee arthroplasty      Left. 8/08 by Dr. Alvan Dame  . Tonsillectomy  09-28-11    child  . Cholecystectomy      1985 by Dr. Laurin Coder  . Total knee arthroplasty  10/05/2011    Procedure: TOTAL KNEE ARTHROPLASTY;  Surgeon: Gearlean Alf, MD;  Location: [facility];  Service: Orthopedics;  Laterality: Right;  . Transurethral resection of bladder tumor N/A 09/30/2012  Procedure: TRANSURETHRAL RESECTION OF BLADDER TUMOR (TURBT);  Surgeon: Franchot Gallo, MD;  Location: [facility];  Service: Urology;  Laterality: N/A;  WITH MITOMYCIN INSTILLATION    Family History  Problem Relation Age of Onset  . Throat cancer Brother   . Coronary artery disease Father     deceased   History  Substance Use Topics  . Smoking status: Former Smoker -- 0.25 packs/day for 5 years    Types: Cigarettes    Quit date: 07/20/1985  . Smokeless tobacco: Never Used  . Alcohol Use: No    Review of Systems  Constitutional: Negative for fever, chills and fatigue.  HENT: Negative for trouble swallowing.   Eyes: Negative for visual disturbance.  Respiratory: Negative for shortness of breath.   Cardiovascular: Negative for chest pain and palpitations.  Gastrointestinal: Negative for nausea, vomiting, abdominal pain and diarrhea.   Genitourinary: Negative for dysuria and difficulty urinating.  Musculoskeletal: Positive for arthralgias. Negative for neck pain.  Skin: Positive for wound. Negative for color change.  Neurological: Positive for headaches. Negative for dizziness and weakness.  Psychiatric/Behavioral: Negative for dysphoric mood.      Allergies  Review of patient's allergies indicates no known allergies.  Home Medications   Prior to Admission medications   Medication Sig Start Date End Date Taking? Authorizing Provider  carvedilol (COREG) 12.5 MG tablet Take 1 tablet (12.5 mg total) by mouth 2 (two) times daily. 09/20/13   Larey Dresser, MD  diclofenac sodium (VOLTAREN) 1 % GEL Apply 2 g topically 3 (three) times daily. 12/20/13   Doe-Hyun R Shawna Orleans, DO  dicyclomine (BENTYL) 20 MG tablet Take 20 mg by mouth 3 (three) times daily as needed. irritable bowel syndrome 08/26/11   Noralee Space, MD  esomeprazole (NEXIUM) 40 MG capsule Take 1 capsule (40 mg total) by mouth daily before breakfast. 30 min before first meal of day. 08/26/11   Noralee Space, MD  fish oil-omega-3 fatty acids 1000 MG capsule Take 1 g by mouth daily.    Historical Provider, MD  furosemide (LASIX) 40 MG tablet Take 40 mg by mouth daily.    Historical Provider, MD  HYDROcodone-acetaminophen (NORCO) 7.5-325 MG per tablet Take 1 tablet by mouth every 6 (six) hours as needed for moderate pain. 12/19/13   Amy S Esterwood, PA-C  nitroGLYCERIN (NITROSTAT) 0.4 MG SL tablet Place 1 tablet (0.4 mg total) under the tongue every 5 (five) minutes as needed for chest pain. 11/22/13   Thompson Grayer, MD  polyethylene glycol powder (GLYCOLAX/MIRALAX) powder Take 17 g by mouth 2 (two) times daily as needed. 12/20/13   Doe-Hyun R Shawna Orleans, DO  pregabalin (LYRICA) 75 MG capsule Take 75 mg by mouth daily.    Historical Provider, MD  warfarin (COUMADIN) 2 MG tablet Take 2-4 mg by mouth daily at 6 PM. 2mg  on m, w, f. 4mg  on t, r, s, s 09/07/13   Noralee Space, MD   BP 128/67   Pulse 50  Temp(Src) 97.3 F (36.3 C) (Oral)  Resp 16  SpO2 95% Physical Exam  Nursing note and vitals reviewed. Constitutional: He is oriented to person, place, and time. He appears well-developed and well-nourished. No distress.  HENT:  Head: Normocephalic and atraumatic.  2.5 cm laceration of upper lip right of philtrum with bleeding controlled. 4 cm laceration of nasal bridge with bleeding controlled.   Eyes: Conjunctivae and EOM are normal. Pupils are equal, round, and reactive to light.  Neck: Normal range of motion.  Cardiovascular: Normal rate and regular rhythm.  Exam reveals no gallop and no friction rub.   No murmur heard. Pulmonary/Chest: Effort normal and breath sounds normal. He has no wheezes. He has no rales. He exhibits no tenderness.  Abdominal: Soft. He exhibits no distension. There is no tenderness. There is no rebound and no guarding.  Musculoskeletal: Normal range of motion.  Neurological: He is alert and oriented to person, place, and time. No cranial nerve deficit. Coordination normal.  Extremity strength and sensation equal and intact bilaterally. Speech is goal-oriented. Moves limbs without ataxia.   Skin: Skin is warm and dry.  Psychiatric: He has a normal mood and affect. His behavior is normal.    ED Course  Procedures (including critical care time) Labs Review Labs Reviewed  CBC WITH DIFFERENTIAL - Abnormal; Notable for the following:    RBC 3.72 (*)    Hemoglobin 11.8 (*)    HCT 34.5 (*)    All other components within normal limits  BASIC METABOLIC PANEL - Abnormal; Notable for the following:    Sodium 136 (*)    Glucose, Bld 167 (*)    GFR calc non Af Amer 52 (*)    GFR calc Af Amer 60 (*)    All other components within normal limits    Imaging Review Dg Shoulder Right  12/30/2013   CLINICAL DATA:  Fall with diffuse right shoulder pain.  EXAM: RIGHT SHOULDER - 2+ VIEW  COMPARISON:  None.  FINDINGS: There is no evidence of fracture or  dislocation. There is acromioclavicular joint osteoarthritis with joint narrowing and mild upward and downward spurring. No evidence of right upper chest wall trauma.  IMPRESSION: No acute osseous findings.   Electronically Signed   By: Jorje Guild M.D.   On: 12/30/2013 13:50   Ct Head Wo Contrast  12/30/2013   CLINICAL DATA:  Fall.  EXAM: CT HEAD WITHOUT CONTRAST  TECHNIQUE: Contiguous axial images were obtained from the base of the skull through the vertex without intravenous contrast.  COMPARISON:  None.  FINDINGS: No intra-axial or extra-axial pathologic fluid or blood collections identified. Mild atrophy is present. No hydrocephalus. No evidence of skull fracture.Subtle nondisplaced nasal fractures cannot be excluded.  IMPRESSION: No acute intracranial abnormality. Subtle nondisplaced nasal fractures cannot be excluded. Reference made to facial CT report.   Electronically Signed   By: [place]   On: 12/30/2013 13:53   Ct Cervical Spine Wo Contrast  12/30/2013   CLINICAL DATA:  Fall, facial laceration.  EXAM: CT MAXILLOFACIAL WITHOUT CONTRAST  CT CERVICAL SPINE WITHOUT CONTRAST  TECHNIQUE: Multidetector CT imaging of the cervical spine and maxillofacial structures were performed using the standard protocol without intravenous contrast. Multiplanar CT image reconstructions of the cervical spine and maxillofacial structures were also generated.  COMPARISON:  None.  FINDINGS: CT MAXILLOFACIAL FINDINGS  Mild soft tissue swelling over the upper lip and nasal region. There appears to be a laceration near the bridge of the nose. Hyperdense material noted along the surface or within the soft tissues of the nose. There are underlying nasal bone fractures near the midline, slightly depressed.  Zygomatic arches and orbital walls are intact. Mandible is intact. Nasal septum is midline. Paranasal sinuses and mastoids are clear.  CT CERVICAL SPINE FINDINGS  Advanced degenerative changes throughout the  cervical spine. Exuberant partially calcified pannus around the C1-2 articulation anteriorly and odontoid. No fracture. Normal alignment. Prevertebral soft tissues are normal. No epidural or paraspinal hematoma.  IMPRESSION: Slightly depressed  midline nasal bone fracture. Overlying laceration with small radiopaque densities in the soft tissues at the laceration, likely small foreign bodies.  Severe degenerative changes in the cervical spine. No acute bony abnormality.   Electronically Signed   By: Rolm Baptise M.D.   On: 12/30/2013 14:01   Ct Maxillofacial Wo Cm  12/30/2013   CLINICAL DATA:  Fall, facial laceration.  EXAM: CT MAXILLOFACIAL WITHOUT CONTRAST  CT CERVICAL SPINE WITHOUT CONTRAST  TECHNIQUE: Multidetector CT imaging of the cervical spine and maxillofacial structures were performed using the standard protocol without intravenous contrast. Multiplanar CT image reconstructions of the cervical spine and maxillofacial structures were also generated.  COMPARISON:  None.  FINDINGS: CT MAXILLOFACIAL FINDINGS  Mild soft tissue swelling over the upper lip and nasal region. There appears to be a laceration near the bridge of the nose. Hyperdense material noted along the surface or within the soft tissues of the nose. There are underlying nasal bone fractures near the midline, slightly depressed.  Zygomatic arches and orbital walls are intact. Mandible is intact. Nasal septum is midline. Paranasal sinuses and mastoids are clear.  CT CERVICAL SPINE FINDINGS  Advanced degenerative changes throughout the cervical spine. Exuberant partially calcified pannus around the C1-2 articulation anteriorly and odontoid. No fracture. Normal alignment. Prevertebral soft tissues are normal. No epidural or paraspinal hematoma.  IMPRESSION: Slightly depressed midline nasal bone fracture. Overlying laceration with small radiopaque densities in the soft tissues at the laceration, likely small foreign bodies.  Severe degenerative  changes in the cervical spine. No acute bony abnormality.   Electronically Signed   By: Rolm Baptise M.D.   On: 12/30/2013 14:01     EKG Interpretation   Date/Time:  Saturday December 30 2013 13:54:37 EDT Ventricular Rate:  63 PR Interval:  213 QRS Duration: 144 QT Interval:  447 QTC Calculation: 458 R Axis:   -5 Text Interpretation:  Sinus rhythm Borderline prolonged PR interval Left  bundle branch block Confirmed by Wilson Singer  MD, STEPHEN ([id number]) on 12/30/2013  2:00:33 PM      MDM   Final diagnoses:  Fall  Fracture of nasal bones, open  Right shoulder injury    2:31 PM CT scans show mildly depressed nasal bone fracture. CT head and cervical spine unremarkable. Right shoulder xray unremarkable. Patient will have wounds clean and sutured. Patient initially declined pain medication and is now requesting pain medication. Patient will have Vicodin for pain.   Laceration repaired by a student working with Jeannett Senior, PA-C. Patient signed out to [facility], [place], PA-C 12/31/13 [phone]

## 2013-12-31 DIAGNOSIS — R109 Unspecified abdominal pain: Secondary | ICD-10-CM

## 2013-12-31 LAB — PROTIME-INR
INR: 2.36 — ABNORMAL HIGH (ref 0.00–1.49)
Prothrombin Time: 25 seconds — ABNORMAL HIGH (ref 11.6–15.2)

## 2013-12-31 LAB — CBC
HCT: 33.9 % — ABNORMAL LOW (ref 39.0–52.0)
Hemoglobin: 11.4 g/dL — ABNORMAL LOW (ref 13.0–17.0)
MCH: 31.6 pg (ref 26.0–34.0)
MCHC: 33.6 g/dL (ref 30.0–36.0)
MCV: 93.9 fL (ref 78.0–100.0)
Platelets: 168 10*3/uL (ref 150–400)
RBC: 3.61 MIL/uL — ABNORMAL LOW (ref 4.22–5.81)
RDW: 13.1 % (ref 11.5–15.5)
WBC: 5.8 10*3/uL (ref 4.0–10.5)

## 2013-12-31 MED ORDER — SENNOSIDES-DOCUSATE SODIUM 8.6-50 MG PO TABS
2.0000 | ORAL_TABLET | Freq: Two times a day (BID) | ORAL | Status: DC
  Administered 2013-12-31 – 2014-01-01 (×3): 2 via ORAL
  Filled 2013-12-31 (×5): qty 2

## 2013-12-31 MED ORDER — POLYETHYLENE GLYCOL 3350 17 G PO PACK
17.0000 g | PACK | Freq: Every day | ORAL | Status: DC
  Administered 2013-12-31 – 2014-01-01 (×2): 17 g via ORAL
  Filled 2013-12-31 (×2): qty 1

## 2013-12-31 MED ORDER — PANTOPRAZOLE SODIUM 40 MG PO TBEC
40.0000 mg | DELAYED_RELEASE_TABLET | Freq: Two times a day (BID) | ORAL | Status: DC
  Administered 2013-12-31 – 2014-01-01 (×2): 40 mg via ORAL
  Filled 2013-12-31 (×4): qty 1

## 2013-12-31 NOTE — ED Provider Notes (Signed)
Medical screening examination/treatment/procedure(s) were conducted as a shared visit with non-physician practitioner(s) and myself.  I personally evaluated the patient during the encounter.   EKG Interpretation   Date/Time:  Saturday December 30 2013 13:54:37 EDT Ventricular Rate:  63 PR Interval:  213 QRS Duration: 144 QT Interval:  447 QTC Calculation: 458 R Axis:   -5 Text Interpretation:  Sinus rhythm Borderline prolonged PR interval Left  bundle branch block Confirmed by Wilmer Santillo  MD, Lainie Daubert ([id number]) on 12/30/2013  2:00:33 PM     89yM s/p mechanical fall. Facial lacerations. Repair and continued wound care. Nasal fx. Imaging otherwise ok. Neuro exam nonfocal. Will place on abx with overlying laceration. PRN pain meds. ENT FU.   Virgel Manifold, MD 12/31/13 1009

## 2013-12-31 NOTE — Progress Notes (Signed)
UR Completed.  [name] [name] [phone] 12/31/2013  

## 2013-12-31 NOTE — ED Provider Notes (Signed)
Medical screening examination/treatment/procedure(s) were conducted as a shared visit with non-physician practitioner(s) and myself.  I personally evaluated the patient during the encounter. Pt presents after mechanical fall, on coumadin. Complex lac to bridge of nose w/ nasal fracture. CT head nml, but pt has no family/friends to stay w/ him tonight, was unsteady ambulating on PE. [facility] will admit for observation.     EKG Interpretation   Date/Time:  Saturday December 30 2013 13:54:37 EDT Ventricular Rate:  63 PR Interval:  213 QRS Duration: 144 QT Interval:  447 QTC Calculation: 458 R Axis:   -5 Text Interpretation:  Sinus rhythm Borderline prolonged PR interval Left  bundle branch block Confirmed by Wilson Singer  MD, STEPHEN ([id number]) on 12/30/2013  2:00:33 PM        Neta Ehlers, MD 12/31/13 0021

## 2013-12-31 NOTE — Progress Notes (Signed)
[facility] PROGRESS NOTE  [name] [name] ERX:[id number] DOB: 1922/12/28 DOA: 12/30/2013 PCP: Drema Pry, DO Interim summary: [name] [name] is a 80 y.o. male, with history of coronary artery disease/cardiomyopathy while visiting his wife in the nursing home today missed a step and fell on his face sustaining facial lacerations including a nasal bone fracture, open. No preceding chest pains, palpitations, nausea or vomiting, headaches. He has only pain in his nasal bridge area. Patient denies blurring of vision, loss of consciousness, fever or chills. Patient lives alone after his wife was admitted to a nursing home.   Assessment/Plan: 1. Mechanical fall with open nasal bridge fracture: - admitted to med surg for observation.  - pain control and local treatment of the nasal wound.  - oral antibiotics.  - outpatient follow up with ENT.    2. CHRONIC epigastric abdominal pain: - scheduled for EGD on Thursday.  - holding coumadin for the procedure.   3. Type 2 diabetes Mellitus: - SSI.   4. Constipation: - stool softeners  Started.   DVT prophylaxis.   Code Status: DNR Family Communication: family at bedside.  Disposition Plan: pending.    Consultants:  none  Procedures:  none  Antibiotics:  none  HPI/Subjective: Reports no BM for two months. Will start him on stool softeners.   Objective: Filed Vitals:   12/31/13 1432  BP: 109/47  Pulse: 51  Temp: 97.9 F (36.6 C)  Resp: 18    Intake/Output Summary (Last 24 hours) at 12/31/13 1657 Last data filed at 12/31/13 1208  Gross per 24 hour  Intake    483 ml  Output   1165 ml  Net   -682 ml   Filed Weights   12/30/13 2025  Weight: 78.926 kg (174 lb)    Exam:   General:  Alert afebrile comfortable  Cardiovascular: s1s2  Respiratory: ctab  Abdomen: soft NT nd bs+  Musculoskeletal: no pedal edema.   Data Reviewed: Basic Metabolic Panel:  Recent Labs Lab 12/30/13 1340  NA 136*  K 4.6  CL 97   CO2 25  GLUCOSE 167*  BUN 22  CREATININE 1.20  CALCIUM 10.0   Liver Function Tests: No results found for this basename: AST, ALT, ALKPHOS, BILITOT, PROT, ALBUMIN,  in the last 168 hours No results found for this basename: LIPASE, AMYLASE,  in the last 168 hours No results found for this basename: AMMONIA,  in the last 168 hours CBC:  Recent Labs Lab 12/30/13 1340 12/31/13 0600  WBC 5.1 5.8  NEUTROABS 3.5  --   HGB 11.8* 11.4*  HCT 34.5* 33.9*  MCV 92.7 93.9  PLT 189 168   Cardiac Enzymes: No results found for this basename: CKTOTAL, CKMB, CKMBINDEX, TROPONINI,  in the last 168 hours BNP (last 3 results) No results found for this basename: PROBNP,  in the last 8760 hours CBG:  Recent Labs Lab 12/28/13 0946  GLUCAP 167*    No results found for this or any previous visit (from the past 240 hour(s)).   Studies: Dg Shoulder Right  12/30/2013   CLINICAL DATA:  Fall with diffuse right shoulder pain.  EXAM: RIGHT SHOULDER - 2+ VIEW  COMPARISON:  None.  FINDINGS: There is no evidence of fracture or dislocation. There is acromioclavicular joint osteoarthritis with joint narrowing and mild upward and downward spurring. No evidence of right upper chest wall trauma.  IMPRESSION: No acute osseous findings.   Electronically Signed   By: Jorje Guild M.D.   On:  12/30/2013 13:50   Ct Head Wo Contrast  12/30/2013   CLINICAL DATA:  Fall.  EXAM: CT HEAD WITHOUT CONTRAST  TECHNIQUE: Contiguous axial images were obtained from the base of the skull through the vertex without intravenous contrast.  COMPARISON:  None.  FINDINGS: No intra-axial or extra-axial pathologic fluid or blood collections identified. Mild atrophy is present. No hydrocephalus. No evidence of skull fracture.Subtle nondisplaced nasal fractures cannot be excluded.  IMPRESSION: No acute intracranial abnormality. Subtle nondisplaced nasal fractures cannot be excluded. Reference made to facial CT report.   Electronically Signed    By: [place]   On: 12/30/2013 13:53   Ct Cervical Spine Wo Contrast  12/30/2013   CLINICAL DATA:  Fall, facial laceration.  EXAM: CT MAXILLOFACIAL WITHOUT CONTRAST  CT CERVICAL SPINE WITHOUT CONTRAST  TECHNIQUE: Multidetector CT imaging of the cervical spine and maxillofacial structures were performed using the standard protocol without intravenous contrast. Multiplanar CT image reconstructions of the cervical spine and maxillofacial structures were also generated.  COMPARISON:  None.  FINDINGS: CT MAXILLOFACIAL FINDINGS  Mild soft tissue swelling over the upper lip and nasal region. There appears to be a laceration near the bridge of the nose. Hyperdense material noted along the surface or within the soft tissues of the nose. There are underlying nasal bone fractures near the midline, slightly depressed.  Zygomatic arches and orbital walls are intact. Mandible is intact. Nasal septum is midline. Paranasal sinuses and mastoids are clear.  CT CERVICAL SPINE FINDINGS  Advanced degenerative changes throughout the cervical spine. Exuberant partially calcified pannus around the C1-2 articulation anteriorly and odontoid. No fracture. Normal alignment. Prevertebral soft tissues are normal. No epidural or paraspinal hematoma.  IMPRESSION: Slightly depressed midline nasal bone fracture. Overlying laceration with small radiopaque densities in the soft tissues at the laceration, likely small foreign bodies.  Severe degenerative changes in the cervical spine. No acute bony abnormality.   Electronically Signed   By: Rolm Baptise M.D.   On: 12/30/2013 14:01   Ct Maxillofacial Wo Cm  12/30/2013   CLINICAL DATA:  Fall, facial laceration.  EXAM: CT MAXILLOFACIAL WITHOUT CONTRAST  CT CERVICAL SPINE WITHOUT CONTRAST  TECHNIQUE: Multidetector CT imaging of the cervical spine and maxillofacial structures were performed using the standard protocol without intravenous contrast. Multiplanar CT image reconstructions of the  cervical spine and maxillofacial structures were also generated.  COMPARISON:  None.  FINDINGS: CT MAXILLOFACIAL FINDINGS  Mild soft tissue swelling over the upper lip and nasal region. There appears to be a laceration near the bridge of the nose. Hyperdense material noted along the surface or within the soft tissues of the nose. There are underlying nasal bone fractures near the midline, slightly depressed.  Zygomatic arches and orbital walls are intact. Mandible is intact. Nasal septum is midline. Paranasal sinuses and mastoids are clear.  CT CERVICAL SPINE FINDINGS  Advanced degenerative changes throughout the cervical spine. Exuberant partially calcified pannus around the C1-2 articulation anteriorly and odontoid. No fracture. Normal alignment. Prevertebral soft tissues are normal. No epidural or paraspinal hematoma.  IMPRESSION: Slightly depressed midline nasal bone fracture. Overlying laceration with small radiopaque densities in the soft tissues at the laceration, likely small foreign bodies.  Severe degenerative changes in the cervical spine. No acute bony abnormality.   Electronically Signed   By: Rolm Baptise M.D.   On: 12/30/2013 14:01    Scheduled Meds: . bacitracin  1 application Topical BID  . carvedilol  12.5 mg Oral BID  . cephALEXin  500 mg Oral 3 times per day  . furosemide  40 mg Oral Daily  . omega-3 acid ethyl esters  1 g Oral Daily  . pantoprazole  40 mg Oral Daily  . polyethylene glycol  17 g Oral Daily  . pregabalin  75 mg Oral Daily  . senna-docusate  2 tablet Oral BID  . sodium chloride  3 mL Intravenous Q12H   Continuous Infusions:   Principal Problem:   Fall Active Problems:   Open fracture of nasal bone   Fall in elderly patient    Time spent: 35 minutes.     [facility] Pager [phone]  If 7PM-7AM, please contact night-coverage at [email or web address], password [facility] 12/31/2013, 4:57 PM  LOS: 1 day

## 2013-12-31 NOTE — Evaluation (Signed)
Physical Therapy Evaluation Patient Details Name: [name] [name] MRN: [id number] DOB: 1923/02/10 Today's Date: 12/31/2013   History of Present Illness  80 y.o. male with h/o B TKA admitted with fall and open nasal bone fx.   Clinical Impression  *Pt admitted with fall, nasal bone fx**. Pt currently with functional limitations due to the deficits listed below (see PT Problem List).  Pt will benefit from skilled PT to increase their independence and safety with mobility to allow discharge to the venue listed below.   Pt has mild unsteadiness initially upon standing, he walked 330' with min hand held assist. Recommended he use cane when walking. HHPT recommended for home safety eval.   **    Follow Up Recommendations Home health PT    Equipment Recommendations  None recommended by PT    Recommendations for Other Services       Precautions / Restrictions Precautions Precautions: Fall      Mobility  Bed Mobility Overal bed mobility: Modified Independent             General bed mobility comments: HOB up 45*  Transfers Overall transfer level: Needs assistance Equipment used: None Transfers: Sit to/from Stand Sit to Stand: Min assist         General transfer comment: min A for balance, initally had posterior LOB  Ambulation/Gait Ambulation/Gait assistance: Min assist;Supervision Ambulation Distance (Feet): 330 Feet Assistive device: 1 person hand held assist     Gait velocity interpretation: at or above normal speed for age/gender General Gait Details: HHA of 1 for balance initially, then supervision; recommended pt use his quad cane at home  Stairs            Wheelchair Mobility    Modified Rankin (Stroke Patients Only)       Balance Overall balance assessment: Needs assistance   Sitting balance-Leahy Scale: Fair Sitting balance - Comments: has posterior LOB when feet are unsupported     Standing balance-Leahy Scale: Poor Standing balance  comment: min A for initial standing balance due to posterior LOB               High Level Balance Comments: able to turn head to L and R, and look up without LOB while walking; able to reach to floor from standing and turn 360* in standing without LOB             Pertinent Vitals/Pain *0/10 pain*    Home Living Family/patient expects to be discharged to:: Private residence Living Arrangements: Alone (wife is in SNF)   Type of Home: House Home Access: Level entry     Home Layout: One level 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 Additional Comments: pt goes to [employer] daily to exercise and shower, he drives, mostly eats at [employer]    Prior Function Level of Independence: Independent               Hand Dominance        Extremity/Trunk Assessment   Upper Extremity Assessment: Overall WFL for tasks assessed           Lower Extremity Assessment: Overall WFL for tasks assessed      Cervical / Trunk Assessment: Kyphotic  Communication   Communication: No difficulties  Cognition                            General Comments      Exercises  Assessment/Plan    PT Assessment Patient needs continued PT services  PT Diagnosis Difficulty walking   PT Problem List Decreased balance  PT Treatment Interventions Gait training;DME instruction;Therapeutic activities   PT Goals (Current goals can be found in the Care Plan section) Acute Rehab PT Goals Patient Stated Goal: return to independence PT Goal Formulation: With patient Time For Goal Achievement: 01/14/14 Potential to Achieve Goals: Good    Frequency Min 3X/week   Barriers to discharge        Co-evaluation               End of Session Equipment Utilized During Treatment: Gait belt Activity Tolerance: Patient tolerated treatment well Patient left: in chair;with call bell/phone within reach Nurse Communication: Mobility status    Functional Assessment Tool  Used: clinical judgement Functional Limitation: Mobility: Walking and moving around Mobility: Walking and Moving Around Current Status [phone]): At least 1 percent but less than 20 percent impaired, limited or restricted Mobility: Walking and Moving Around Goal Status [phone]): 0 percent impaired, limited or restricted    Time: 0740-0800 PT Time Calculation (min): 20 min   Charges:   PT Evaluation $Initial PT Evaluation Tier I: 1 Procedure PT Treatments $Gait Training: 8-22 mins   PT G Codes:   Functional Assessment Tool Used: clinical judgement Functional Limitation: Mobility: Walking and moving around    [place], [employer] 12/31/2013, 8:09 AM [phone]

## 2014-01-01 DIAGNOSIS — R1013 Epigastric pain: Secondary | ICD-10-CM

## 2014-01-01 MED ORDER — CEPHALEXIN 500 MG PO CAPS: 500.0000 mg | ORAL_CAPSULE | Freq: Three times a day (TID) | ORAL | Status: DC

## 2014-01-01 MED ORDER — HYDROCODONE-ACETAMINOPHEN 5-325 MG PO TABS: 1.0000 | ORAL_TABLET | Freq: Four times a day (QID) | ORAL | Status: DC | PRN

## 2014-01-01 MED ORDER — BACITRACIN 500 UNIT/GM EX OINT: 1.0000 "application " | TOPICAL_OINTMENT | Freq: Two times a day (BID) | CUTANEOUS | Status: DC

## 2014-01-01 MED ORDER — WARFARIN SODIUM 2 MG PO TABS: 2.0000 mg | ORAL_TABLET | Freq: Every day | ORAL | Status: DC

## 2014-01-01 NOTE — Care Management Note (Unsigned)
    Page 1 of 1   01/01/2014     1:59:11 PM CARE MANAGEMENT NOTE 01/01/2014  Patient:  [name] [name], [name] [name]   Account Number:  [id number]  Date Initiated:  01/01/2014  Documentation initiated by:  [facility]  Subjective/Objective Assessment:   80 year old male admitted after a fall.     Action/Plan:   From home, lives alone. PT recommending HHPT.   Anticipated DC Date:  01/02/2014   Anticipated DC Plan:  [place]  CM consult      Choice offered to / List presented to:  C-1 Patient        [place] arranged  HH-1 RN  [place].   Status of service:  Completed, signed off Medicare Important Message given?   (If response is "NO", the following Medicare IM given date fields will be blank) Date Medicare IM given:   Date Additional Medicare IM given:    Discharge Disposition:  [place]  Per UR Regulation:  Reviewed for med. necessity/level of care/duration of stay  If discussed at [place] of Stay Meetings, dates discussed:    Comments:

## 2014-01-01 NOTE — Consult Note (Signed)
WOC wound consult note Reason for Consult:Recommendations for wound care for bridge of nose and upper lip until MD appointment Wound type:Traumatic Pressure Ulcer POA: No Measurement: Well approximated suture lines at upper lip (0.6cm) and bridge of nose (1.5cm x 1.5cm oval)  Wound HFW:YOVZCHYIFOYD suture lines  Drainage (amount, consistency, odor) None Periwound:intact with areas of bruising at bilateral orbital areas, chin and nose Dressing procedure/placement/frequency:Staff have already been applying bacitracin ointment to suture lines at bridge of nose and to upper lip.  I would continue this POC and have patient follow up with either PCP or ENT in 7-10 days for suture removal. [place] nursing team will not follow, but will remain available to this patient, the nursing and medical teams.  Please re-consult if needed. Thanks, Maudie Flakes, MSN, RN, [place], [place], [place] [phone])

## 2014-01-01 NOTE — Progress Notes (Signed)
Physical Therapy Treatment Patient Details Name: [name] [name] MRN: [id number] DOB: 1923/05/17 Today's Date: 01/22/14    History of Present Illness 80 y.o. male with h/o B TKA admitted with fall and open nasal bone fx.     PT Comments    Pt progressing;   Follow Up Recommendations  Home health PT     Equipment Recommendations  [name] (if does not have)    Recommendations for Other Services       Precautions / Restrictions Precautions Precautions: Fall    Mobility  Bed Mobility Overal bed mobility: Modified Independent             General bed mobility comments: HOB up 45*  Transfers Overall transfer level: Needs assistance Equipment used: Quad cane;Straight cane Transfers: Sit to/from Stand Sit to Stand: Min guard;Supervision         General transfer comment: cues for hand placement  Ambulation/Gait Ambulation/Gait assistance: Min guard Ambulation Distance (Feet): 270 Feet Assistive device: Quad cane;Straight cane Gait Pattern/deviations: Step-to pattern;Step-through pattern;Trunk flexed     General Gait Details: min/guard for safety, balance   Stairs            Wheelchair Mobility    Modified Rankin (Stroke Patients Only)       Balance Overall balance assessment: Needs assistance   Sitting balance-Leahy Scale: Good Sitting balance - Comments: able to doff socks     Standing balance-Leahy Scale: Fair               High level balance activites: Backward walking;Direction changes;Turns;Sudden stops;Head turns High Level Balance Comments: no LOB but pt with decr gait velocity, at risk for falls    Cognition Arousal/Alertness: Awake/alert Behavior During Therapy: WFL for tasks assessed/performed Overall Cognitive Status: Within Functional Limits for tasks assessed                      Exercises      General Comments        Pertinent Vitals/Pain C/o cervical pain, not rated, NT aware    Home Living                       Prior Function            PT Goals (current goals can now be found in the care plan section) Acute Rehab PT Goals Patient Stated Goal: return to independence PT Goal Formulation: With patient Time For Goal Achievement: 01/14/14 Potential to Achieve Goals: Good Progress towards PT goals: Progressing toward goals    Frequency  Min 3X/week    PT Plan Current plan remains appropriate    Co-evaluation             End of Session Equipment Utilized During Treatment: Gait belt Activity Tolerance: Patient tolerated treatment well Patient left: in chair;with call bell/phone within reach     Time: 1145-1210 PT Time Calculation (min): 25 min  Charges:  $Gait Training: 23-37 mins                    G Codes:      [name] [name] 01/22/2014, 12:26 PM

## 2014-01-02 ENCOUNTER — Telehealth: Payer: Self-pay | Admitting: Cardiology

## 2014-01-02 ENCOUNTER — Telehealth: Payer: Self-pay | Admitting: Internal Medicine

## 2014-01-02 NOTE — Telephone Encounter (Signed)
Pt is needing new rx pregabalin (LYRICA) 75 MG capsule, [facility], [facility] [address].

## 2014-01-02 NOTE — Telephone Encounter (Signed)
Spoke with patient and advised him to call PCP to prescribe for him.

## 2014-01-02 NOTE — Telephone Encounter (Signed)
New message     Will Dr Aundra Dubin call in a presc for lyrical for jerking in his legs.  Dr Lenna Gilford has retired.  [facility].  I asked pt if this was a heart medication--he said no

## 2014-01-03 ENCOUNTER — Telehealth: Payer: Self-pay | Admitting: Physician Assistant

## 2014-01-03 NOTE — Telephone Encounter (Signed)
Ok for RF for lyrica 90 day supply with 1 RF

## 2014-01-03 NOTE — Telephone Encounter (Signed)
Patient notified and he wants to go ahead with tomorrow as planned.

## 2014-01-03 NOTE — Telephone Encounter (Signed)
Spoke with patient and he fell on Saturday and broke his nose. It is not bleeding and does not have packing. He is scheduled for a EUS/ERCP tomorrow and wants to know if he should keep this appointment. Please, advise.

## 2014-01-03 NOTE — Telephone Encounter (Signed)
Yes, it is still OK to proceed.  If he prefers to wait a while to let the injury heal, I can do it next Thursday instead (pretty open schedule that day still) however he has been holding his blood thinner (hopefully) and if we postponed the case that would have to be held longer.

## 2014-01-04 ENCOUNTER — Encounter (HOSPITAL_COMMUNITY)
Admission: RE | Disposition: A | Discharge status: Home / Self Care | Payer: Self-pay | Source: Ambulatory Visit | Attending: Gastroenterology

## 2014-01-04 ENCOUNTER — Telehealth: Payer: Self-pay

## 2014-01-04 ENCOUNTER — Encounter (HOSPITAL_COMMUNITY): Payer: Self-pay | Admitting: *Deleted

## 2014-01-04 ENCOUNTER — Ambulatory Visit (HOSPITAL_COMMUNITY)
Admission: RE | Admit: 2014-01-04 | Discharge: 2014-01-04 | Disposition: A | Discharge status: Home / Self Care | Payer: Medicare Other | Source: Ambulatory Visit | Attending: Gastroenterology | Admitting: Gastroenterology

## 2014-01-04 ENCOUNTER — Encounter (HOSPITAL_COMMUNITY): Payer: Medicare Other | Admitting: Anesthesiology

## 2014-01-04 ENCOUNTER — Ambulatory Visit (HOSPITAL_COMMUNITY): Payer: Medicare Other | Admitting: Anesthesiology

## 2014-01-04 ENCOUNTER — Telehealth: Payer: Self-pay | Admitting: Internal Medicine

## 2014-01-04 DIAGNOSIS — C679 Malignant neoplasm of bladder, unspecified: Secondary | ICD-10-CM | POA: Insufficient documentation

## 2014-01-04 DIAGNOSIS — Z87891 Personal history of nicotine dependence: Secondary | ICD-10-CM | POA: Insufficient documentation

## 2014-01-04 DIAGNOSIS — G609 Hereditary and idiopathic neuropathy, unspecified: Secondary | ICD-10-CM | POA: Insufficient documentation

## 2014-01-04 DIAGNOSIS — R933 Abnormal findings on diagnostic imaging of other parts of digestive tract: Secondary | ICD-10-CM

## 2014-01-04 DIAGNOSIS — K838 Other specified diseases of biliary tract: Secondary | ICD-10-CM | POA: Insufficient documentation

## 2014-01-04 DIAGNOSIS — Z923 Personal history of irradiation: Secondary | ICD-10-CM | POA: Insufficient documentation

## 2014-01-04 DIAGNOSIS — I5022 Chronic systolic (congestive) heart failure: Secondary | ICD-10-CM | POA: Insufficient documentation

## 2014-01-04 DIAGNOSIS — I2510 Atherosclerotic heart disease of native coronary artery without angina pectoris: Secondary | ICD-10-CM | POA: Insufficient documentation

## 2014-01-04 DIAGNOSIS — I428 Other cardiomyopathies: Secondary | ICD-10-CM | POA: Insufficient documentation

## 2014-01-04 DIAGNOSIS — M1710 Unilateral primary osteoarthritis, unspecified knee: Secondary | ICD-10-CM | POA: Insufficient documentation

## 2014-01-04 DIAGNOSIS — K219 Gastro-esophageal reflux disease without esophagitis: Secondary | ICD-10-CM | POA: Insufficient documentation

## 2014-01-04 DIAGNOSIS — I10 Essential (primary) hypertension: Secondary | ICD-10-CM | POA: Insufficient documentation

## 2014-01-04 DIAGNOSIS — M48061 Spinal stenosis, lumbar region without neurogenic claudication: Secondary | ICD-10-CM | POA: Insufficient documentation

## 2014-01-04 DIAGNOSIS — Z7901 Long term (current) use of anticoagulants: Secondary | ICD-10-CM | POA: Insufficient documentation

## 2014-01-04 DIAGNOSIS — D638 Anemia in other chronic diseases classified elsewhere: Secondary | ICD-10-CM | POA: Insufficient documentation

## 2014-01-04 DIAGNOSIS — K589 Irritable bowel syndrome without diarrhea: Secondary | ICD-10-CM | POA: Insufficient documentation

## 2014-01-04 DIAGNOSIS — E785 Hyperlipidemia, unspecified: Secondary | ICD-10-CM | POA: Insufficient documentation

## 2014-01-04 DIAGNOSIS — Z79899 Other long term (current) drug therapy: Secondary | ICD-10-CM | POA: Insufficient documentation

## 2014-01-04 DIAGNOSIS — Z86718 Personal history of other venous thrombosis and embolism: Secondary | ICD-10-CM | POA: Insufficient documentation

## 2014-01-04 DIAGNOSIS — Z8546 Personal history of malignant neoplasm of prostate: Secondary | ICD-10-CM | POA: Insufficient documentation

## 2014-01-04 DIAGNOSIS — E119 Type 2 diabetes mellitus without complications: Secondary | ICD-10-CM | POA: Insufficient documentation

## 2014-01-04 DIAGNOSIS — K8689 Other specified diseases of pancreas: Secondary | ICD-10-CM

## 2014-01-04 HISTORY — PX: EUS: SHX5427

## 2014-01-04 LAB — GLUCOSE, CAPILLARY: Glucose-Capillary: 194 mg/dL — ABNORMAL HIGH (ref 70–99)

## 2014-01-04 SURGERY — ESOPHAGEAL ENDOSCOPIC ULTRASOUND (EUS) RADIAL
Anesthesia: General

## 2014-01-04 MED ORDER — LIDOCAINE HCL (CARDIAC) 20 MG/ML IV SOLN
INTRAVENOUS | Status: DC | PRN
  Administered 2014-01-04: 50 mg via INTRAVENOUS

## 2014-01-04 MED ORDER — SUCCINYLCHOLINE CHLORIDE 20 MG/ML IJ SOLN
INTRAMUSCULAR | Status: DC | PRN
  Administered 2014-01-04: 100 mg via INTRAVENOUS

## 2014-01-04 MED ORDER — PROPOFOL 10 MG/ML IV BOLUS
INTRAVENOUS | Status: DC | PRN
  Administered 2014-01-04: 140 mg via INTRAVENOUS

## 2014-01-04 MED ORDER — FENTANYL CITRATE 0.05 MG/ML IJ SOLN
INTRAMUSCULAR | Status: DC | PRN
  Administered 2014-01-04 (×2): 50 ug via INTRAVENOUS

## 2014-01-04 MED ORDER — GLYCOPYRROLATE 0.2 MG/ML IJ SOLN
INTRAMUSCULAR | Status: AC
  Filled 2014-01-04: qty 1

## 2014-01-04 MED ORDER — FENTANYL CITRATE 0.05 MG/ML IJ SOLN
INTRAMUSCULAR | Status: AC
  Filled 2014-01-04: qty 2

## 2014-01-04 MED ORDER — LIDOCAINE HCL (CARDIAC) 20 MG/ML IV SOLN
INTRAVENOUS | Status: AC
  Filled 2014-01-04: qty 5

## 2014-01-04 MED ORDER — LACTATED RINGERS IV SOLN
INTRAVENOUS | Status: DC | PRN
  Administered 2014-01-04: 13:00:00 via INTRAVENOUS

## 2014-01-04 MED ORDER — PREGABALIN 75 MG PO CAPS: 75.0000 mg | ORAL_CAPSULE | Freq: Every day | ORAL | Status: DC

## 2014-01-04 MED ORDER — SODIUM CHLORIDE 0.9 % IV SOLN: INTRAVENOUS | Status: DC

## 2014-01-04 MED ORDER — GLYCOPYRROLATE 0.2 MG/ML IJ SOLN
INTRAMUSCULAR | Status: DC | PRN
  Administered 2014-01-04: 0.2 mg via INTRAVENOUS

## 2014-01-04 MED ORDER — PROPOFOL 10 MG/ML IV BOLUS
INTRAVENOUS | Status: AC
  Filled 2014-01-04: qty 20

## 2014-01-04 NOTE — Telephone Encounter (Signed)
No answer and no voice mail his procedure is today.

## 2014-01-04 NOTE — Telephone Encounter (Signed)
Message copied by Barron Alvine on Thu Jan 04, 2014  2:31 PM ------      Message from: Owens Loffler P      Created: Thu Jan 04, 2014  2:27 PM       He needs rov with me in 6-7 weeks, CMET and CBC a day or so prior.            Thanks       ------

## 2014-01-04 NOTE — Op Note (Signed)
[facility] [place], [address]   ENDOSCOPIC ULTRASOUND PROCEDURE REPORT  PATIENT: [name], [name]  MR#: [id number] BIRTHDATE: [birth year]  GENDER: Male ENDOSCOPIST: [name], MD PROCEDURE DATE:  01/04/2014 PROCEDURE:   Upper EUS ASA CLASS:      Class IV INDICATIONS:   1.  chronically dilated bile duct, recent CT scan by urology (incidental) shows mass in/near head of pancreas and the CBD is more dilated than normal; LFTs normal, CA 19-9 169. MEDICATIONS: General endotracheal anesthesia (GETA)  DESCRIPTION OF PROCEDURE:   After the risks benefits and alternatives of the procedure were  explained, informed consent was obtained. The patient was then placed in the left, lateral, decubitus postion and IV sedation was administered. Throughout the procedure, the patients blood pressure, pulse and oxygen saturations were monitored continuously.  Under direct visualization, the Pentax Radial EUS [id number]  endoscope was introduced through the mouth  and advanced to the second portion of the duodenum .  Water was used as necessary to provide an acoustic interface.  Upon completion of the imaging, water was removed and the patient was sent to the recovery room in satisfactory condition.  Endoscopic findings with ERCP scope, radial and linear echoendoscopes: 1. Normal UGI tract except for evidence of previous biliary sphincterotomy  EUS findings: 1. The pancreatic parenchyma was very atrophic and fatty replaced. No discrete pancreatic masses or tumors were seen. 2. CBD was dilated diffusely, up to 1.8cm, no clear sign of masses, stones. 3. Main pancreatid duct was normal appearing. 4. I was unable to visualize the lesion described on recent CT scan (abnormally enhancing soft tissue density in region of distal CBD, pancreatic head).  Impression: The bile duct is diffusely dilated however I could not identify a clear mass lesion in or near the  pancreas.  Given his advanced age (54), frail state, recent fall resulting in facial injuries and the fact that his LFTs are normal and his CBD is known to be chronically dilated, I elected not to proceed with ERCP.  Rather, he will return to my office in 6-8 weeks, labs a day or so prior, and will follow him for signficant clinical changes.   _______________________________ eSignedMilus Banister, MD 01/04/2014 2:24 PM

## 2014-01-04 NOTE — Telephone Encounter (Signed)
[facility] [address]

## 2014-01-04 NOTE — Transfer of Care (Signed)
Immediate Anesthesia Transfer of Care Note  Patient: [name] [name]  Procedure(s) Performed: Procedure(s): ESOPHAGEAL ENDOSCOPIC ULTRASOUND (EUS) RADIAL (N/A)  Patient Location: PACU  Anesthesia Type:General  Level of Consciousness: awake, alert  and oriented  Airway & Oxygen Therapy: Patient Spontanous Breathing and Patient connected to face mask oxygen  Post-op Assessment: Report given to PACU RN and Post -op Vital signs reviewed and stable  Post vital signs: Reviewed and stable  Complications: No apparent anesthesia complications

## 2014-01-04 NOTE — Anesthesia Postprocedure Evaluation (Signed)
Anesthesia Post Note  Patient: [name] [name]  Procedure(s) Performed: Procedure(s) (LRB): ESOPHAGEAL ENDOSCOPIC ULTRASOUND (EUS) RADIAL (N/A)  Anesthesia type: General  Patient location: PACU  Post pain: Pain level controlled  Post assessment: Post-op Vital signs reviewed  Last Vitals:  Filed Vitals:   01/04/14 1430  BP: 130/91  Pulse:   Temp:   Resp:     Post vital signs: Reviewed  Level of consciousness: sedated  Complications: No apparent anesthesia complications

## 2014-01-04 NOTE — Telephone Encounter (Signed)
Physical Therapist with [facility] left message on triage line at [facility]  She offered PT to Mr. Dues on Wed, Raynelle Dick, and Friday.  He refused.  Stated he wants to wait until next week.  She is calling to Advise Dr. Shawna Orleans.

## 2014-01-04 NOTE — Anesthesia Preprocedure Evaluation (Addendum)
Anesthesia Evaluation  Patient identified by MRN, date of birth, ID band Patient awake    Reviewed: Allergy & Precautions, H&P , NPO status , Patient's Chart, lab work & pertinent test results, reviewed documented beta blocker date and time   Airway Mallampati: II TM Distance: >3 FB Neck ROM: Full    Dental no notable dental hx.    Pulmonary neg pulmonary ROS, former smoker,  breath sounds clear to auscultation  Pulmonary exam normal       Cardiovascular hypertension, Pt. on medications and Pt. on home beta blockers + CAD (Nonobstructive CAD per chart history), + Peripheral Vascular Disease and DVT negative cardio ROS  + dysrhythmias IIRhythm:Regular Rate:Normal  lbbb Nonischemic cardiomyopathy ef 25%   Neuro/Psych  Neuromuscular disease negative neurological ROS  negative psych ROS   GI/Hepatic negative GI ROS, Neg liver ROS, GERD-  Medicated,  Endo/Other  negative endocrine ROSdiabetes  Renal/GU negative Renal ROS  negative genitourinary   Musculoskeletal negative musculoskeletal ROS (+)   Abdominal   Peds negative pediatric ROS (+)  Hematology negative hematology ROS (+) anemia ,   Anesthesia Other Findings   Reproductive/Obstetrics negative OB ROS                        Anesthesia Physical  Anesthesia Plan  ASA: III  Anesthesia Plan: General   Post-op Pain Management:    Induction: Intravenous  Airway Management Planned: Oral ETT  Additional Equipment:   Intra-op Plan:   Post-operative Plan: Extubation in OR  Informed Consent: I have reviewed the patients History and Physical, chart, labs and discussed the procedure including the risks, benefits and alternatives for the proposed anesthesia with the patient or authorized representative who has indicated his/her understanding and acceptance.   Dental advisory given  Plan Discussed with: CRNA  Anesthesia Plan Comments: (Pt  with recent fall with non-displaced nasal bridge fracture. Negative head CT. Pt 4 days out from injury and doing well. Had long discussion with patient and Dr. Ardis Hughs about injuries and risk for displacing nasal fracture while prone, or tissue breakdown from pressure during prone. )       Anesthesia Quick Evaluation

## 2014-01-04 NOTE — Interval H&P Note (Signed)
History and Physical Interval Note:  01/04/2014 1:05 PM  [name] [name]  has presented today for surgery, with the diagnosis of pancreatic mass dilated bile duct  The various methods of treatment have been discussed with the patient and family. After consideration of risks, benefits and other options for treatment, the patient has consented to  Procedure(s): ESOPHAGEAL ENDOSCOPIC ULTRASOUND (EUS) RADIAL (N/A) ENDOSCOPIC RETROGRADE CHOLANGIOPANCREATOGRAPHY (ERCP) (N/A) as a surgical intervention .  The patient's history has been reviewed, patient examined, no change in status, stable for surgery.  I have reviewed the patient's chart and labs.  Questions were answered to the patient's satisfaction.     [name]

## 2014-01-04 NOTE — H&P (View-Only) (Signed)
Subjective:    Patient ID: [name], male    DOB: 1922-09-03, 80 y.o.   MRN: [id number]  HPI  [name] [name] is a very nice 80 year old white male previously a patient of Dr. Buel Ream who is being referred today by Dr. Brayton Mars for evaluation of abnormal pancreas on recent CT scan. Patient has history of prostate cancer and had undergone CT scan of the abdomen and pelvis with contrast on 12/06/2013 done at [facility] urology. CT scan showed borderline cardiomegaly and abnormal enhancing infiltrative soft tissue density appearing to occlude the distal common bile duct and tracking in the vicinity of the pancreatic head and in between the SMA and SMV concerning for pancreatic adenocarcinoma or possible metastatic disease. There is intraoperative and extrahepatic biliary ductal dilation. The soft tissue mass measures 2.8 x 2.9 cm and is difficult to separate from the adjacent pancreatic head. Patient had undergone very remote cholecystectomy some 30 years ago. He says he has been having epigastric pain over the past year, somewhat progressive and describes it as sharp and located in the epigastrium. He has not noted any change with by mouth intake and has not had any weight loss. His appetite has been fine no nausea or vomiting. No fever or chills. His bowel movements have been somewhat constipated. He says his stomach hurts all the time despite taking Nexium regularly. He also has some hydrocodone at home which she says is not very helpful. Patient had undergone workup for the dilated common bile duct with MRI and MRCP in October of 2014. This showed chronic biliary dilation pneumobilia consistent with prior sphincterotomy common bile duct 1.8 cm severe fatty replacement of the pancreas no pancreatic ductal dilation or evidence of pancreatic mass and no evidence of choledocholithiasis.    Review of Systems  Constitutional: Negative.   Eyes: Negative.   Respiratory: Negative.   Cardiovascular: Negative.     Gastrointestinal: Positive for abdominal pain.  Endocrine: Negative.   Genitourinary: Negative.   Musculoskeletal: Negative.   Skin: Negative.   Allergic/Immunologic: Negative.   Neurological: Negative.   Hematological: Negative.   Psychiatric/Behavioral: Negative.    Outpatient Prescriptions Prior to Visit  Medication Sig Dispense Refill  . carvedilol (COREG) 12.5 MG tablet Take 1 tablet (12.5 mg total) by mouth 2 (two) times daily.  180 tablet  3  . diclofenac (VOLTAREN) 75 MG EC tablet Take 1 tablet (75 mg total) by mouth 2 (two) times daily.  180 tablet  3  . dicyclomine (BENTYL) 20 MG tablet Take 20 mg by mouth 3 (three) times daily as needed. irritable bowel syndrome      . esomeprazole (NEXIUM) 40 MG capsule Take 1 capsule (40 mg total) by mouth daily before breakfast. 30 min before first meal of day.  90 capsule  3  . fish oil-omega-3 fatty acids 1000 MG capsule Take 1 g by mouth daily.      . furosemide (LASIX) 40 MG tablet Take 40 mg by mouth daily.      . nitroGLYCERIN (NITROSTAT) 0.4 MG SL tablet Place 1 tablet (0.4 mg total) under the tongue every 5 (five) minutes as needed for chest pain.  90 tablet  3  . pravastatin (PRAVACHOL) 40 MG tablet Take 1 tablet (40 mg total) by mouth at bedtime.  90 tablet  1  . pregabalin (LYRICA) 75 MG capsule Take 75 mg by mouth daily.      . ranitidine (ZANTAC) 150 MG tablet Take 1 tablet (150 mg total) by mouth  2 (two) times daily as needed. Heart burn  180 tablet  1  . traMADol (ULTRAM) 50 MG tablet Take 1 tablet (50 mg total) by mouth 3 (three) times daily as needed (arthritis pain).  50 tablet  1  . warfarin (COUMADIN) 2 MG tablet Take 2-4 mg by mouth daily at 6 PM. 2mg  on m, w, f. 4mg  on t, r, s, s      . zolpidem (AMBIEN) 5 MG tablet TAKE 1 TABLET BY MOUTH AT BEDTIME AS NEEDED  30 tablet  1   No facility-administered medications prior to visit.   No Known Allergies Patient Active Problem List   Diagnosis Date Noted  . Atypical chest  pain 11/22/2013  . Encounter for therapeutic drug monitoring 08/18/2013  . Bladder cancer 02/20/2013  . Anemia of chronic disease 02/20/2013  . OA (osteoarthritis) of knee 10/05/2011  . PVC's (premature ventricular contractions) 10/05/2011  . HYPERTENSION, BENIGN 06/23/2010  . SYSTOLIC HEART FAILURE, CHRONIC 11/20/2008  . PERIPHERAL NEUROPATHY 07/06/2007  . CORONARY ARTERY DISEASE 07/06/2007  . CARDIOMYOPATHY 07/06/2007  . LBBB 07/06/2007  . DIVERTICULOSIS, COLON 07/06/2007  . SPINAL STENOSIS, LUMBAR 07/06/2007  . DM 05/03/2007  . HYPERLIPIDEMIA 05/03/2007  . DVT 05/03/2007  . GERD 05/03/2007  . IBS 05/03/2007  . DEGENERATIVE JOINT DISEASE 05/03/2007  . PROSTATE CANCER, HX OF 05/03/2007   History  Substance Use Topics  . Smoking status: Former Smoker    Quit date: 07/20/1985  . Smokeless tobacco: Never Used  . Alcohol Use: No   family history includes Coronary artery disease in his father; Throat cancer in his brother.     Objective:   Physical Exam Well-developed elderly white male in no acute distress, pleasant blood pressure 132/72 pulse 70 height 5 foot 10 weight 185. HEENT ;nontraumatic normocephalic EOMI PERRLA sclera anicteric, Supple no JVD, Cardiovascular; regular rate and rhythm with S1-S2 no murmur or gallop, Pulmonary; clear bilaterally, Abdomen; soft basically nontender there is no palpable mass or hepatosplenomegaly does have a midline scar from remote cholecystectomy, bowel sounds are active, Rectal ;exam not done, Extremities; no clubbing cyanosis or edema skin warm and dry, Psych ;mood and affect appropriate       Assessment & Plan:  #54  80 year old white male with persistent epigastric pain over several months and recent CT scan showing intra-and extrahepatic ductal dilation and what appears to be an infiltrative soft tissue mass around the distal common bile duct measuring 2.8 x 2.9 cm and difficult to separate from the adjacent pancreatic head, common bile  duct measuring up to 2.3 cm -malignancy favored over inflammatory process. This patient has had a chronically dilated common bile duct and MRCP done October 2014 did not show any evidence of pancreatic mass #2 diabetes mellitus #3 hypertension #4 coronary artery disease #5 cardiomyopathy with EF of 35-40% #6 history of DVT on chronic Coumadin #7 status post remote cholecystectomy #8 history of prostate cancer status post radiation  Plan; Patient will continue Nexium 40 mg by mouth every morning Voltaren is  on his list but he states he has not taken this for a long time CBC with differential same at lipase and CA 19-9 as well as pro time Schedule patient for endoscopic ultrasound with probable biopsy and ERCP with Dr. Ardis Hughs for next week June 11. Procedures were discussed in detail with the patient and he is agreeable to proceed. Dr. Ardis Hughs has also reviewed his scan Patient will need to hold Coumadin for 5 days prior to the  procedure and we'll obtain consent from his cardiologist Dr. Aundra Dubin Vicodin 7.5/325 one every 6 hours as needed for pain

## 2014-01-04 NOTE — Discharge Instructions (Signed)

## 2014-01-05 ENCOUNTER — Encounter (HOSPITAL_COMMUNITY): Payer: Self-pay | Admitting: Gastroenterology

## 2014-01-05 NOTE — Discharge Summary (Signed)
Physician Discharge Summary  [name] [name] JJK:[id number] DOB: 08/17/1922 DOA: 12/30/2013  PCP: Drema Pry, DO  Admit date: 12/30/2013 Discharge date: 01/01/2014  Time spent: 30 minutes  Recommendations for Outpatient Follow-up:  Follow up with ENT as soon as possible Follow up with PCP in one week Follow up with GI as recommended.  Discharge Diagnoses:  Principal Problem:   Fall Active Problems:   Open fracture of nasal bone   Fall in elderly patient nasal fracture Chronic abdominal pain.   Discharge Condition: improved.   Diet recommendation: regular.   Filed Weights   12/30/13 2025  Weight: 78.926 kg (174 lb)    History of present illness:  [name] [name] is a 80 y.o. male, with history of coronary artery disease/cardiomyopathy while visiting his wife in the nursing home today missed a step and fell on his face sustaining facial lacerations including a nasal bone fracture, open. No preceding chest pains, palpitations, nausea or vomiting, headaches. He has only pain in his nasal bridge area. Patient denies blurring of vision, loss of consciousness, fever or chills. Patient lives alone after his wife was admitted to a nursing home.  He was admitted to [facility] for further evaluation.    Hospital Course:  1. Mechanical fall with open nasal bridge fracture:he was admitted to med surg for observation.  He got adequate pain control and local treatment of the nasal wound. He was started on oral keflex to prevent infection of the sinuses . He was recommended to  follow up with ENT.  2. CHRONIC epigastric abdominal pain:  - scheduled for EGD on Thursday.  - holding coumadin for the procedure.  3. Type 2 diabetes Mellitus:  - SSI.  4. Constipation:  - stool softeners Started.    Procedures:  none  Consultations:  none  Discharge Exam: Filed Vitals:   01/01/14 0527  BP: 108/63  Pulse: 60  Temp: 98.3 F (36.8 C)  Resp: 20    General: alert afebrile  comfortable Cardiovascular: s1s2 Respiratory: ctab  Discharge Instructions You were cared for by a hospitalist during your hospital stay. If you have any questions about your discharge medications or the care you received while you were in the hospital after you are discharged, you can call the unit and asked to speak with the hospitalist on call if the hospitalist that took care of you is not available. Once you are discharged, your primary care physician will handle any further medical issues. Please note that NO REFILLS for any discharge medications will be authorized once you are discharged, as it is imperative that you return to your primary care physician (or establish a relationship with a primary care physician if you do not have one) for your aftercare needs so that they can reassess your need for medications and monitor your lab values.     Medication List    STOP taking these medications       HYDROcodone-acetaminophen 7.5-325 MG per tablet  Commonly known as:  NORCO  Replaced by:  HYDROcodone-acetaminophen 5-325 MG per tablet      TAKE these medications       bacitracin 500 UNIT/GM ointment  Apply 1 application topically 2 (two) times daily.     carvedilol 12.5 MG tablet  Commonly known as:  COREG  Take 1 tablet (12.5 mg total) by mouth 2 (two) times daily.     cephALEXin 500 MG capsule  Commonly known as:  KEFLEX  Take 1 capsule (500 mg total) by mouth  every 8 (eight) hours.     esomeprazole 40 MG capsule  Commonly known as:  NEXIUM  Take 1 capsule (40 mg total) by mouth daily before breakfast. 30 min before first meal of day.     fish oil-omega-3 fatty acids 1000 MG capsule  Take 1 g by mouth daily.     furosemide 40 MG tablet  Commonly known as:  LASIX  Take 40 mg by mouth daily.     HYDROcodone-acetaminophen 5-325 MG per tablet  Commonly known as:  NORCO/VICODIN  Take 1 tablet by mouth every 6 (six) hours as needed for moderate pain or severe pain.      nitroGLYCERIN 0.4 MG SL tablet  Commonly known as:  NITROSTAT  Place 1 tablet (0.4 mg total) under the tongue every 5 (five) minutes as needed for chest pain.     polyethylene glycol powder powder  Commonly known as:  GLYCOLAX/MIRALAX  Take 17 g by mouth 2 (two) times daily as needed.     warfarin 2 MG tablet  Commonly known as:  COUMADIN  Take 1-2 tablets (2-4 mg total) by mouth daily at 6 PM. 2mg  on monday, wednesday, friday. 4mg  on tuesday, thursday, ssaturday, sunday     zolpidem 10 MG tablet  Commonly known as:  AMBIEN  Take 10 mg by mouth at bedtime as needed for sleep.       No Known Allergies     Follow-up Information   Schedule an appointment as soon as possible for a visit with Jodi Marble, MD.   Specialty:  Otolaryngology   Contact information:   [address] [place] [address] [phone]       Follow up with Drema Pry, DO. Schedule an appointment as soon as possible for a visit in 1 week.   Specialty:  Internal Medicine   Contact information:   [place] [address] [phone]       Follow up with [place]. (For Home Health Physical therapy and Registered Nurse Services)    Contact information:   [address] [place] [address] [phone]        The results of significant diagnostics from this hospitalization (including imaging, microbiology, ancillary and laboratory) are listed below for reference.    Significant Diagnostic Studies: Dg Shoulder Right  12/30/2013   CLINICAL DATA:  Fall with diffuse right shoulder pain.  EXAM: RIGHT SHOULDER - 2+ VIEW  COMPARISON:  None.  FINDINGS: There is no evidence of fracture or dislocation. There is acromioclavicular joint osteoarthritis with joint narrowing and mild upward and downward spurring. No evidence of right upper chest wall trauma.  IMPRESSION: No acute osseous findings.   Electronically Signed   By:  Jorje Guild M.D.   On: 12/30/2013 13:50   Ct Head Wo Contrast  12/30/2013   CLINICAL DATA:  Fall.  EXAM: CT HEAD WITHOUT CONTRAST  TECHNIQUE: Contiguous axial images were obtained from the base of the skull through the vertex without intravenous contrast.  COMPARISON:  None.  FINDINGS: No intra-axial or extra-axial pathologic fluid or blood collections identified. Mild atrophy is present. No hydrocephalus. No evidence of skull fracture.Subtle nondisplaced nasal fractures cannot be excluded.  IMPRESSION: No acute intracranial abnormality. Subtle nondisplaced nasal fractures cannot be excluded. Reference made to facial CT report.   Electronically Signed   By: [place]   On: 12/30/2013 13:53   Ct Cervical Spine Wo Contrast  12/30/2013   CLINICAL DATA:  Fall, facial laceration.  EXAM: CT MAXILLOFACIAL WITHOUT CONTRAST  CT CERVICAL SPINE WITHOUT CONTRAST  TECHNIQUE: Multidetector CT imaging of the cervical spine and maxillofacial structures were performed using the standard protocol without intravenous contrast. Multiplanar CT image reconstructions of the cervical spine and maxillofacial structures were also generated.  COMPARISON:  None.  FINDINGS: CT MAXILLOFACIAL FINDINGS  Mild soft tissue swelling over the upper lip and nasal region. There appears to be a laceration near the bridge of the nose. Hyperdense material noted along the surface or within the soft tissues of the nose. There are underlying nasal bone fractures near the midline, slightly depressed.  Zygomatic arches and orbital walls are intact. Mandible is intact. Nasal septum is midline. Paranasal sinuses and mastoids are clear.  CT CERVICAL SPINE FINDINGS  Advanced degenerative changes throughout the cervical spine. Exuberant partially calcified pannus around the C1-2 articulation anteriorly and odontoid. No fracture. Normal alignment. Prevertebral soft tissues are normal. No epidural or paraspinal hematoma.  IMPRESSION: Slightly depressed  midline nasal bone fracture. Overlying laceration with small radiopaque densities in the soft tissues at the laceration, likely small foreign bodies.  Severe degenerative changes in the cervical spine. No acute bony abnormality.   Electronically Signed   By: Rolm Baptise M.D.   On: 12/30/2013 14:01   Ct Maxillofacial Wo Cm  12/30/2013   CLINICAL DATA:  Fall, facial laceration.  EXAM: CT MAXILLOFACIAL WITHOUT CONTRAST  CT CERVICAL SPINE WITHOUT CONTRAST  TECHNIQUE: Multidetector CT imaging of the cervical spine and maxillofacial structures were performed using the standard protocol without intravenous contrast. Multiplanar CT image reconstructions of the cervical spine and maxillofacial structures were also generated.  COMPARISON:  None.  FINDINGS: CT MAXILLOFACIAL FINDINGS  Mild soft tissue swelling over the upper lip and nasal region. There appears to be a laceration near the bridge of the nose. Hyperdense material noted along the surface or within the soft tissues of the nose. There are underlying nasal bone fractures near the midline, slightly depressed.  Zygomatic arches and orbital walls are intact. Mandible is intact. Nasal septum is midline. Paranasal sinuses and mastoids are clear.  CT CERVICAL SPINE FINDINGS  Advanced degenerative changes throughout the cervical spine. Exuberant partially calcified pannus around the C1-2 articulation anteriorly and odontoid. No fracture. Normal alignment. Prevertebral soft tissues are normal. No epidural or paraspinal hematoma.  IMPRESSION: Slightly depressed midline nasal bone fracture. Overlying laceration with small radiopaque densities in the soft tissues at the laceration, likely small foreign bodies.  Severe degenerative changes in the cervical spine. No acute bony abnormality.   Electronically Signed   By: Rolm Baptise M.D.   On: 12/30/2013 14:01    Microbiology: No results found for this or any previous visit (from the past 240 hour(s)).   Labs: Basic  Metabolic Panel:  Recent Labs Lab 12/30/13 1340  NA 136*  K 4.6  CL 97  CO2 25  GLUCOSE 167*  BUN 22  CREATININE 1.20  CALCIUM 10.0   Liver Function Tests: No results found for this basename: AST, ALT, ALKPHOS, BILITOT, PROT, ALBUMIN,  in the last 168 hours No results found for this basename: LIPASE, AMYLASE,  in the last 168 hours No results found for this basename: AMMONIA,  in the last 168 hours CBC:  Recent Labs Lab 12/30/13 1340 12/31/13 0600  WBC 5.1 5.8  NEUTROABS 3.5  --   HGB 11.8* 11.4*  HCT 34.5* 33.9*  MCV 92.7 93.9  PLT 189 168  Cardiac Enzymes: No results found for this basename: CKTOTAL, CKMB, CKMBINDEX, TROPONINI,  in the last 168 hours BNP: BNP (last 3 results) No results found for this basename: PROBNP,  in the last 8760 hours CBG:  Recent Labs Lab 01/04/14 1316  GLUCAP 194*       Signed:  Anothy Bufano  [facility] 01/05/2014, 12:42 AM

## 2014-01-05 NOTE — Telephone Encounter (Signed)
Pt aware and will call back to schedule.

## 2014-01-09 ENCOUNTER — Other Ambulatory Visit: Payer: Self-pay | Admitting: Cardiology

## 2014-01-09 ENCOUNTER — Telehealth: Payer: Self-pay | Admitting: Internal Medicine

## 2014-01-09 DIAGNOSIS — I5022 Chronic systolic (congestive) heart failure: Secondary | ICD-10-CM

## 2014-01-09 DIAGNOSIS — I2510 Atherosclerotic heart disease of native coronary artery without angina pectoris: Secondary | ICD-10-CM

## 2014-01-09 MED ORDER — RANITIDINE HCL 150 MG PO TABS: 150.0000 mg | ORAL_TABLET | Freq: Two times a day (BID) | ORAL | Status: DC | PRN

## 2014-01-09 MED ORDER — WARFARIN SODIUM 2 MG PO TABS: 2.0000 mg | ORAL_TABLET | Freq: Every day | ORAL | Status: DC

## 2014-01-09 MED ORDER — CARVEDILOL 12.5 MG PO TABS: 12.5000 mg | ORAL_TABLET | Freq: Two times a day (BID) | ORAL | Status: DC

## 2014-01-09 MED ORDER — PRAVASTATIN SODIUM 40 MG PO TABS: 40.0000 mg | ORAL_TABLET | Freq: Every day | ORAL | Status: DC

## 2014-01-09 NOTE — Telephone Encounter (Signed)
All meds refilled except coumadin.  Forward note to Villa Herb, RN for coumadin refill

## 2014-01-09 NOTE — Telephone Encounter (Signed)
[facility] is requesting 90 day re-fills on the following: pravastatin (PRAVACHOL) 40 MG tablet carvedilol (COREG) 12.5 MG tablet warfarin (COUMADIN) 2 MG tablet ranitidine (ZANTAC) 150 MG tablet

## 2014-01-10 ENCOUNTER — Ambulatory Visit (INDEPENDENT_AMBULATORY_CARE_PROVIDER_SITE_OTHER): Payer: Medicare Other | Admitting: General Practice

## 2014-01-10 DIAGNOSIS — I82409 Acute embolism and thrombosis of unspecified deep veins of unspecified lower extremity: Secondary | ICD-10-CM

## 2014-01-10 DIAGNOSIS — Z5181 Encounter for therapeutic drug level monitoring: Secondary | ICD-10-CM

## 2014-01-10 LAB — POCT INR: INR: 1.4

## 2014-01-10 NOTE — Progress Notes (Signed)
Pre visit review using our clinic review tool, if applicable. No additional management support is needed unless otherwise documented below in the visit note. 

## 2014-01-11 ENCOUNTER — Other Ambulatory Visit: Payer: Self-pay | Admitting: General Practice

## 2014-01-11 MED ORDER — WARFARIN SODIUM 2 MG PO TABS: ORAL_TABLET | ORAL | Status: DC

## 2014-01-24 ENCOUNTER — Ambulatory Visit (INDEPENDENT_AMBULATORY_CARE_PROVIDER_SITE_OTHER): Payer: Medicare Other | Admitting: General Practice

## 2014-01-24 DIAGNOSIS — I82409 Acute embolism and thrombosis of unspecified deep veins of unspecified lower extremity: Secondary | ICD-10-CM

## 2014-01-24 DIAGNOSIS — Z5181 Encounter for therapeutic drug level monitoring: Secondary | ICD-10-CM

## 2014-01-24 LAB — POCT INR: INR: 2.6

## 2014-01-24 NOTE — Progress Notes (Signed)
Pre visit review using our clinic review tool, if applicable. No additional management support is needed unless otherwise documented below in the visit note. 

## 2014-01-31 ENCOUNTER — Encounter: Payer: Self-pay | Admitting: Internal Medicine

## 2014-01-31 ENCOUNTER — Ambulatory Visit (INDEPENDENT_AMBULATORY_CARE_PROVIDER_SITE_OTHER): Payer: Medicare Other | Admitting: Internal Medicine

## 2014-01-31 VITALS — BP 120/60 | HR 76 | Temp 98.1°F | Ht 70.0 in | Wt 174.0 lb

## 2014-01-31 DIAGNOSIS — I5022 Chronic systolic (congestive) heart failure: Secondary | ICD-10-CM

## 2014-01-31 DIAGNOSIS — S022XXS Fracture of nasal bones, sequela: Secondary | ICD-10-CM

## 2014-01-31 DIAGNOSIS — R935 Abnormal findings on diagnostic imaging of other abdominal regions, including retroperitoneum: Secondary | ICD-10-CM

## 2014-01-31 DIAGNOSIS — I2510 Atherosclerotic heart disease of native coronary artery without angina pectoris: Secondary | ICD-10-CM

## 2014-01-31 DIAGNOSIS — I10 Essential (primary) hypertension: Secondary | ICD-10-CM

## 2014-01-31 DIAGNOSIS — S0292XS Unspecified fracture of facial bones, sequela: Secondary | ICD-10-CM

## 2014-01-31 DIAGNOSIS — S0291XS Unspecified fracture of skull, sequela: Secondary | ICD-10-CM

## 2014-01-31 HISTORY — DX: Abnormal findings on diagnostic imaging of other abdominal regions, including retroperitoneum: R93.5

## 2014-01-31 LAB — CBC WITH DIFFERENTIAL/PLATELET
Basophils Absolute: 0 10*3/uL (ref 0.0–0.1)
Basophils Relative: 0.4 % (ref 0.0–3.0)
EOS ABS: 0.1 10*3/uL (ref 0.0–0.7)
Eosinophils Relative: 1.5 % (ref 0.0–5.0)
HEMATOCRIT: 34.3 % — AB (ref 39.0–52.0)
HEMOGLOBIN: 11.7 g/dL — AB (ref 13.0–17.0)
LYMPHS ABS: 1.1 10*3/uL (ref 0.7–4.0)
Lymphocytes Relative: 25.1 % (ref 12.0–46.0)
MCHC: 34.1 g/dL (ref 30.0–36.0)
MCV: 94.7 fl (ref 78.0–100.0)
MONOS PCT: 8.1 % (ref 3.0–12.0)
Monocytes Absolute: 0.4 10*3/uL (ref 0.1–1.0)
NEUTROS ABS: 2.9 10*3/uL (ref 1.4–7.7)
Neutrophils Relative %: 64.9 % (ref 43.0–77.0)
Platelets: 166 10*3/uL (ref 150.0–400.0)
RBC: 3.63 Mil/uL — ABNORMAL LOW (ref 4.22–5.81)
RDW: 13.9 % (ref 11.5–15.5)
WBC: 4.4 10*3/uL (ref 4.0–10.5)

## 2014-01-31 LAB — BASIC METABOLIC PANEL
BUN: 27 mg/dL — ABNORMAL HIGH (ref 6–23)
CHLORIDE: 99 meq/L (ref 96–112)
CO2: 27 meq/L (ref 19–32)
Calcium: 9 mg/dL (ref 8.4–10.5)
Creatinine, Ser: 1.3 mg/dL (ref 0.4–1.5)
GFR: 56.27 mL/min — ABNORMAL LOW (ref >60.00)
GLUCOSE: 228 mg/dL — AB (ref 70–99)
POTASSIUM: 4.3 meq/L (ref 3.5–5.1)
SODIUM: 133 meq/L — AB (ref 135–145)

## 2014-01-31 LAB — HEPATIC FUNCTION PANEL
ALBUMIN: 3.8 g/dL (ref 3.5–5.2)
ALT: 15 U/L (ref 0–53)
AST: 22 U/L (ref 0–37)
Alkaline Phosphatase: 59 U/L (ref 39–117)
Bilirubin, Direct: 0.1 mg/dL (ref 0.0–0.3)
Total Bilirubin: 0.6 mg/dL (ref 0.2–1.2)
Total Protein: 7 g/dL (ref 6.0–8.3)

## 2014-01-31 MED ORDER — CARVEDILOL 12.5 MG PO TABS: 12.5000 mg | ORAL_TABLET | Freq: Two times a day (BID) | ORAL | Status: DC

## 2014-01-31 MED ORDER — WARFARIN SODIUM 2 MG PO TABS: ORAL_TABLET | ORAL | Status: DC

## 2014-01-31 MED ORDER — PRAVASTATIN SODIUM 40 MG PO TABS: 40.0000 mg | ORAL_TABLET | Freq: Every day | ORAL | Status: DC

## 2014-01-31 NOTE — Progress Notes (Signed)
Pre visit review using our clinic review tool, if applicable. No additional management support is needed unless otherwise documented below in the visit note. 

## 2014-01-31 NOTE — Assessment & Plan Note (Addendum)
04/2013 1. Stable chronic biliary dilatation status post cholecystectomy and  presumed sphincterotomy. No evidence of choledocholithiasis.  2. Stable severe fatty replacement of the pancreas. No pancreatic  ductal dilatation.  3. No acute inflammatory changes identified  Discussed patient with Dr. Ardis Hughs. Considering his advanced age and multiple comorbidities patient would be high risk for ultrasound guided biopsy of pancreas. Even if patient determined to have pancreatic cancer aggressive therapy would not be an option. This was also discussed with patient at length. He agrees to watchful waiting. Follow liver function tests every 3-6 months. If he should develop obstructive jaundice, arrange referral to Dr. Ardis Hughs for palliative biliary stent.  Lab Results  Component Value Date   ALT 15 01/31/2014   AST 22 01/31/2014   ALKPHOS 59 01/31/2014   BILITOT 0.6 01/31/2014   Lab Results  Component Value Date   WBC 4.4 01/31/2014   HGB 11.7* 01/31/2014   HCT 34.3* 01/31/2014   MCV 94.7 01/31/2014   PLT 166.0 01/31/2014

## 2014-01-31 NOTE — Assessment & Plan Note (Signed)
Healing well.  Patient reports accidentally missing a step.  His gait is normal.

## 2014-01-31 NOTE — Progress Notes (Signed)
Subjective:    Patient ID: [name] [name], male    DOB: [birth year], 80 y.o.   MRN: [id number]  HPI  80 year old white male with history of possible pancreatic mass, coronary artery disease, History of DVT on chronic coagulation for followup. Interval medical history-patient seen in emergency room after suffering fall. He Reports he missed a step and fell forward. He suffered slightly depressed midline nasal bone fracture. He reports he was seen by ENT and no further intervention was needed.  Patient was scheduled for possible ultrasound guided biopsy of pancreatic mass the endoscope. However procedure was canceled because patient did not hold his Coumadin as directed.  Since his fall, he has lost approximately 10 pounds. Patient reports he lives alone prepares his own meals. Eat breakfast at home and usually eats lunch and dinner at local [employer].  His appetite is normal.  DM II diabetes - diet controlled.   Lab Results  Component Value Date   HGBA1C 7.3* 12/20/2013     Review of Systems Negative for nausea or abdominal pain    Past Medical History  Diagnosis Date  . Coronary atherosclerosis of unspecified type of vessel, native or graft   . Other primary cardiomyopathies   . Other left bundle branch block   . DVT (deep venous thrombosis)     Hx of it. -7 yrs ago  . Other and unspecified hyperlipidemia   . Type II or unspecified type diabetes mellitus without mention of complication, not stated as uncontrolled   . Diverticulosis of colon (without mention of hemorrhage)   . Irritable bowel syndrome   . Personal history of malignant neoplasm of prostate   . Spinal stenosis, lumbar region, without neurogenic claudication   . Unspecified hereditary and idiopathic peripheral neuropathy   . Nonischemic cardiomyopathy     EF 35-40%  . Coronary disease     nonobstructive coronary disease at catheterzation 2002.   Marland Kitchen DVT (deep venous thrombosis) left leg yrs ago    Hx of it on Coumadin  therapy  . Sinus bradycardia     requiring discontinuation of Toprol   . Hypertension   . History of skin cancer   . Esophageal reflux     OCCASIONAL  . Bladder tumor   . Difficulty sleeping   . Colon polyp 2001    tubular adenoma   . Arthritis     hands  . Prostate cancer yrs ago    s/p seed implant    History   Social History  . Marital Status: Married    Spouse Name: N/A    Number of Children: 0  . Years of Education: N/A   Occupational History  .     Social History Main Topics  . Smoking status: Former Smoker -- 0.25 packs/day for 5 years    Types: Cigarettes    Quit date: 07/20/1985  . Smokeless tobacco: Never Used  . Alcohol Use: No  . Drug Use: No  . Sexual Activity: Not on file   Other Topics Concern  . Not on file   Social History Narrative   Married, no children.     Past Surgical History  Procedure Laterality Date  . Appendectomy    . Knee arthroscopy      L knee. 1991 by Dr. Jenean Lindau  . Ercp with sphincterotomy and stone removal      5/93 by Dr. Fuller Plan  . Bilat inguinal hernia repair      1998 by Dr. March Rummage  .  Radium seed implantation for prostate cancer      s/p. 1/00 by Dr. Terance Hart and Danny Lawless  . R cataract surgery Bilateral     2004 by Dr. Herbert Deaner  . Repair of right recurrent right inguinal hernia      12/07 by Dr. Bubba Camp  . Basal cell cancer      L ear. s/p Moh's surgery 3/09 Dr. Harvel Quale  . Total knee arthroplasty      Left. 8/08 by Dr. Alvan Dame  . Tonsillectomy  09-28-11    child  . Cholecystectomy      1985 by Dr. Laurin Coder  . Total knee arthroplasty  10/05/2011    Procedure: TOTAL KNEE ARTHROPLASTY;  Surgeon: Gearlean Alf, MD;  Location: [facility];  Service: Orthopedics;  Laterality: Right;  . Transurethral resection of bladder tumor N/A 09/30/2012    Procedure: TRANSURETHRAL RESECTION OF BLADDER TUMOR (TURBT);  Surgeon: Franchot Gallo, MD;  Location: [facility];  Service: Urology;  Laterality: N/A;  WITH MITOMYCIN INSTILLATION   . Eus  N/A 01/04/2014    Procedure: ESOPHAGEAL ENDOSCOPIC ULTRASOUND (EUS) RADIAL;  Surgeon: [name], MD;  Location: [facility];  Service: Endoscopy;  Laterality: N/A;    Family History  Problem Relation Age of Onset  . Throat cancer Brother   . Coronary artery disease Father     deceased    No Known Allergies  Current Outpatient Prescriptions on File Prior to Visit  Medication Sig Dispense Refill  . bacitracin 500 UNIT/GM ointment Apply 1 application topically 2 (two) times daily.  15 g  0  . esomeprazole (NEXIUM) 40 MG capsule Take 1 capsule (40 mg total) by mouth daily before breakfast. 30 min before first meal of day.  90 capsule  3  . fish oil-omega-3 fatty acids 1000 MG capsule Take 1 g by mouth daily.      . furosemide (LASIX) 40 MG tablet TAKE 1 TABLET BY MOUTH EVERY MORNING  30 tablet  4  . HYDROcodone-acetaminophen (NORCO/VICODIN) 5-325 MG per tablet Take 1 tablet by mouth every 6 (six) hours as needed for moderate pain or severe pain.  10 tablet  0  . nitroGLYCERIN (NITROSTAT) 0.4 MG SL tablet Place 1 tablet (0.4 mg total) under the tongue every 5 (five) minutes as needed for chest pain.  90 tablet  3  . polyethylene glycol powder (GLYCOLAX/MIRALAX) powder Take 17 g by mouth 2 (two) times daily as needed.  3350 g  5  . pregabalin (LYRICA) 75 MG capsule Take 1 capsule (75 mg total) by mouth daily.  90 capsule  0  . ranitidine (ZANTAC) 150 MG tablet Take 1 tablet (150 mg total) by mouth 2 (two) times daily as needed.  180 tablet  3  . zolpidem (AMBIEN) 10 MG tablet Take 10 mg by mouth at bedtime as needed for sleep.      . [DISCONTINUED] Calcium Carbonate-Vitamin D (CALTRATE 600+D) 600-400 MG-UNIT per tablet Take 1 tablet by mouth daily.        No current facility-administered medications on file prior to visit.    BP 102/64  Pulse 76  Temp(Src) 98.1 F (36.7 C) (Oral)  Ht 5\' 10"  (1.778 m)  Wt 174 lb (78.926 kg)  BMI 24.97 kg/m2    Objective:   Physical Exam    Constitutional: He is oriented to person, place, and time. He appears well-developed and well-nourished.  HENT:  Head: Normocephalic and atraumatic.  Mouth/Throat: Oropharynx is clear and moist.  Cardiovascular: Normal rate,  regular rhythm and normal heart sounds.   Pulmonary/Chest: Effort normal and breath sounds normal. He has no wheezes.  Abdominal: Soft. Bowel sounds are normal.  Minimal epigastric tenderness.  No ascites  Neurological: He is alert and oriented to person, place, and time. No cranial nerve deficit.  Psychiatric: He has a normal mood and affect. His behavior is normal.          Assessment & Plan:

## 2014-01-31 NOTE — Assessment & Plan Note (Addendum)
BP with manual cuff is 120/60.  No change in medication. Lab Results  Component Value Date   CREATININE 1.3 01/31/2014   Lab Results  Component Value Date   NA 133* 01/31/2014   K 4.3 01/31/2014   CL 99 01/31/2014   CO2 27 01/31/2014

## 2014-02-01 ENCOUNTER — Other Ambulatory Visit: Payer: Self-pay

## 2014-02-01 MED ORDER — ACCU-CHEK SOFT TOUCH LANCETS MISC: Status: DC

## 2014-02-01 MED ORDER — GLUCOSE BLOOD VI STRP: ORAL_STRIP | Status: DC

## 2014-02-01 NOTE — Progress Notes (Signed)
EUS was performed, the mass was not visible.  I agree with Dr. Shawna Orleans that he should be followed clinically, and check serial LFTs

## 2014-02-06 ENCOUNTER — Other Ambulatory Visit: Payer: Self-pay | Admitting: Pulmonary Disease

## 2014-02-07 ENCOUNTER — Other Ambulatory Visit: Payer: Self-pay | Admitting: Internal Medicine

## 2014-02-16 ENCOUNTER — Telehealth: Payer: Self-pay | Admitting: Internal Medicine

## 2014-02-16 ENCOUNTER — Ambulatory Visit (INDEPENDENT_AMBULATORY_CARE_PROVIDER_SITE_OTHER): Payer: Medicare Other | Admitting: Cardiology

## 2014-02-16 ENCOUNTER — Encounter: Payer: Self-pay | Admitting: Cardiology

## 2014-02-16 VITALS — BP 120/60 | HR 58 | Ht 70.0 in | Wt 171.0 lb

## 2014-02-16 DIAGNOSIS — I2510 Atherosclerotic heart disease of native coronary artery without angina pectoris: Secondary | ICD-10-CM

## 2014-02-16 DIAGNOSIS — I5022 Chronic systolic (congestive) heart failure: Secondary | ICD-10-CM

## 2014-02-16 DIAGNOSIS — I428 Other cardiomyopathies: Secondary | ICD-10-CM

## 2014-02-16 MED ORDER — FUROSEMIDE 20 MG PO TABS: 20.0000 mg | ORAL_TABLET | Freq: Every day | ORAL | Status: DC

## 2014-02-16 NOTE — Telephone Encounter (Signed)
This was done 02/01/14

## 2014-02-16 NOTE — Patient Instructions (Addendum)
Decrease lasix (furosemide) to 20mg  daily.   Your physician recommends that you schedule a follow-up appointment in: 4 months with Dr Aundra Dubin.   I have spoken with [facility] at Dr Lora Havens office [phone]. She is going to let Dr Shawna Orleans know that you need your test strips and lancets refilled.

## 2014-02-16 NOTE — Telephone Encounter (Signed)
Pt is needing new rxLancets (ACCU-CHEK SOFT TOUCH) lancets and glucose blood test strips.

## 2014-02-18 NOTE — Progress Notes (Signed)
Patient ID: [name] [name], male   DOB: 1923-03-12, 80 y.o.   MRN: [id number] PPCP: Dr. Lenna Gilford  80 yo with history of nonischemic cardiomyopathy presents for cardiology followup.  Last echo was in 7/12 with moderately dilated LV and EF 25%.  Since last appointment, a pancreatic mass was found.  He decided against biopsy given comorbidities and frailty.  He has lost about 13 lbs.  He tripped and fell, fracturing bone in his nose a couple of weeks ago.  He denies exertional dyspnea but is not very active.  He does not appear to be taking losartan anymore.  He has had some orthostatic-type lightheadedness, which may have triggered discontinuation of losartan.  He is still taking Coreg.  Mild atypical chest pain occasionally.  He feels like he is having to go to the bathroom too much with Lasix.   Labs (2/12): K 4.4, creatinine 1.0, LDL 86, HDL 55 Labs (8/12): K 5.3, creatinine 1.1, LDL 98, HDL 57 Labs (9/12): BNP 206 Labs (10/12): K 4.7, creatinine 1.0 Labs (3/13): K 3.5, creatinine 0.94 Labs (8/13): K 4.1, creatinine 1.2 Labs (3/14): K 5, creatinine 1.03 Labs (8/14): LDL 83, K 5.1, creatinine 1.2 Labs (10/14): K 4.2, creatinine 1.2 Labs (7/15): K 4.3, creatinine 1.3  ECG: NSR, IVCD, 1st degree AVB  PMH: 1. Nonischemic CMP: LHC (2002) with nonobstructive CAD.  Echo (12/10): EF 30-35%, diffuse hypokinesis worse in the inferior and posterior walls, mild AI, mild MR.  Echo (7/12): moderately dilated LV, EF 25%, diffuse hypokinesis, PA systolic pressure 49 mmHg.  2. H/o appendectomy 3. H/o DVT left leg, on chronic warfarin 4. IBS 5. H/o cholecystectomy 6. ERCP with sphincterotomy and stone removal 7. Prostate cancer: seed implantation.  8. Hernia repair 9. Bilateral TKR 10. Diabetes mellitus: Diet-controlled 11. Hyperlipidemia 12. Low back pain 13. LBBB/IVCD 14. Bladder cancer: TURBT 3/14.  15. Pancreatic mass  SH: Married, no children.  Quit smoking.  Rare ETOH.   FH: Father with  CAD.  ROS: All systems reviewed and negative except as per HPI.   Current Outpatient Prescriptions  Medication Sig Dispense Refill  . bacitracin 500 UNIT/GM ointment Apply 1 application topically 2 (two) times daily.  15 g  0  . carvedilol (COREG) 12.5 MG tablet Take 1 tablet (12.5 mg total) by mouth 2 (two) times daily.  180 tablet  3  . esomeprazole (NEXIUM) 40 MG capsule Take 1 capsule (40 mg total) by mouth daily before breakfast. 30 min before first meal of day.  90 capsule  3  . fish oil-omega-3 fatty acids 1000 MG capsule Take 1 g by mouth daily.      Marland Kitchen glucose blood test strip Use to check fasting blood sugar 3-4 times per week  300 each  3  . HYDROcodone-acetaminophen (NORCO/VICODIN) 5-325 MG per tablet Take 1 tablet by mouth every 6 (six) hours as needed for moderate pain or severe pain.  10 tablet  0  . Lancets (ACCU-CHEK SOFT TOUCH) lancets Use as instructed  300 each  3  . meloxicam (MOBIC) 7.5 MG tablet TAKE 1 TABLET BY MOUTH EVERY DAY  90 tablet  2  . nitroGLYCERIN (NITROSTAT) 0.4 MG SL tablet Place 1 tablet (0.4 mg total) under the tongue every 5 (five) minutes as needed for chest pain.  90 tablet  3  . polyethylene glycol powder (GLYCOLAX/MIRALAX) powder Take 17 g by mouth 2 (two) times daily as needed.  3350 g  5  . pravastatin (PRAVACHOL) 40 MG tablet  Take 1 tablet (40 mg total) by mouth daily.  90 tablet  1  . pregabalin (LYRICA) 75 MG capsule Take 1 capsule (75 mg total) by mouth daily.  90 capsule  0  . ranitidine (ZANTAC) 150 MG tablet Take 1 tablet (150 mg total) by mouth 2 (two) times daily as needed.  180 tablet  3  . warfarin (COUMADIN) 2 MG tablet Take as directed by anticoagulation clinic  140 tablet  1  . zolpidem (AMBIEN) 10 MG tablet Take 10 mg by mouth at bedtime as needed for sleep.      . furosemide (LASIX) 20 MG tablet Take 1 tablet (20 mg total) by mouth daily.  30 tablet  6  . [DISCONTINUED] Calcium Carbonate-Vitamin D (CALTRATE 600+D) 600-400 MG-UNIT per  tablet Take 1 tablet by mouth daily.        No current facility-administered medications for this visit.    BP 120/60  Pulse 58  Ht 5\' 10"  (1.778 m)  Wt 171 lb (77.565 kg)  BMI 24.54 kg/m2 General: elderly, NAD Neck: No JVD, no thyromegaly or thyroid nodule.  Lungs: Clear to auscultation bilaterally with normal respiratory effort. CV: Nondisplaced PMI.  Heart regular S1/S2, no S3/S4, 2/6 HSM at apex.  No ankle edema bilaterally.  No carotid bruit.  Normal pedal pulses.  Abdomen: Soft, nontender, no hepatosplenomegaly, no distention.  Neurologic: Alert and oriented x 3.  Psych: Normal affect. Extremities: No clubbing or cyanosis.   Assessment/Plan: SYSTOLIC HEART FAILURE, CHRONIC  Long history of nonischemic cardiomyopathy. Last EF 25%. NYHA class II symptoms. No JVD, volume status looks ok today.  - Continue current Coreg. - I think that he can decrease Lasix to 20 mg daily.  He will call with dyspnea or weight gain.  - Given orthostatic symptoms and recent fall, I think that he can stay off losartan.  DVT  History of DVT, on chronic coumadin.  Pancreatic mass No workup planned at this time.   Loralie Champagne 02/18/2014

## 2014-02-22 ENCOUNTER — Other Ambulatory Visit: Payer: Self-pay | Admitting: Urology

## 2014-02-22 ENCOUNTER — Encounter: Payer: Self-pay | Admitting: Urology

## 2014-02-23 ENCOUNTER — Telehealth: Payer: Self-pay

## 2014-02-23 NOTE — Telephone Encounter (Signed)
Message copied by Barron Alvine on Fri Feb 23, 2014 11:00 AM ------      Message from: Barron Alvine      Created: Fri Jan 05, 2014  9:23 AM       Pt needs labs and f/u ------

## 2014-02-23 NOTE — Telephone Encounter (Signed)
ok 

## 2014-02-23 NOTE — Telephone Encounter (Signed)
The pt is having surgery on his bladder next week and wants to call back to set up follow up appt.

## 2014-02-26 ENCOUNTER — Inpatient Hospital Stay (HOSPITAL_COMMUNITY)
Admission: RE | Admit: 2014-02-26 | Discharge: 2014-02-26 | Disposition: A | Discharge status: Home / Self Care | Payer: Medicare Other | Source: Ambulatory Visit

## 2014-02-26 NOTE — Patient Instructions (Signed)
SHIHAB [place]  02/26/2014   Your procedure is scheduled on:  03/05/14    Report to [facility].  Follow the Signs to [place] at   0530     am  Call this number if you have problems the morning of surgery: [phone]   Remember:   Do not eat food or drink liquids after midnight.   Take these medicines the morning of surgery with A SIP OF WATER:    Do not wear jewelry,   Do not wear lotions, powders, or perfumes., deodorant    Men may shave face and neck.  Do not bring valuables to the hospital.  Contacts, dentures or bridgework may not be worn into surgery.  Leave suitcase in the car. After surgery it may be brought to your room.  For patients admitted to the hospital, checkout time is 11:00 AM the day of  discharge.         Please read over the following fact sheets that you were given:[place] - Preparing for Surgery Before surgery, you can play an important role.  Because skin is not sterile, your skin needs to be as free of germs as possible.  You can reduce the number of germs on your skin by washing with CHG (chlorahexidine gluconate) soap before surgery.  CHG is an antiseptic cleaner which kills germs and bonds with the skin to continue killing germs even after washing. Please DO NOT use if you have an allergy to CHG or antibacterial soaps.  If your skin becomes reddened/irritated stop using the CHG and inform your nurse when you arrive at Short Stay. Do not shave (including legs and underarms) for at least 48 hours prior to the first CHG shower.  You may shave your face/neck. Please follow these instructions carefully:  1.  Shower with CHG Soap the night before surgery and the  morning of Surgery.  2.  If you choose to wash your hair, wash your hair first as usual with your  normal  shampoo.  3.  After you shampoo, rinse your hair and body thoroughly to remove the  shampoo.                           4.  Use CHG as you would any other liquid soap.  You can  apply chg directly  to the skin and wash                       Gently with a scrungie or clean washcloth.  5.  Apply the CHG Soap to your body ONLY FROM THE NECK DOWN.   Do not use on face/ open                           Wound or open sores. Avoid contact with eyes, ears mouth and genitals (private parts).                       Wash face,  Genitals (private parts) with your normal soap.             6.  Wash thoroughly, paying special attention to the area where your surgery  will be performed.  7.  Thoroughly rinse your body with warm water from the neck down.  8.  DO NOT shower/wash with your normal soap after using and rinsing off  the CHG Soap.                9.  Pat yourself dry with a clean towel.            10.  Wear clean pajamas.            11.  Place clean sheets on your bed the night of your first shower and do not  sleep with pets. Day of Surgery : Do not apply any lotions/deodorants the morning of surgery.  Please wear clean clothes to the hospital/surgery center.  FAILURE TO FOLLOW THESE INSTRUCTIONS MAY RESULT IN THE CANCELLATION OF YOUR SURGERY PATIENT SIGNATURE_________________________________  NURSE SIGNATURE__________________________________  ________________________________________________________________________ , coughing and deep breathing exercises, leg exercises

## 2014-02-27 ENCOUNTER — Telehealth: Payer: Self-pay | Admitting: Pulmonary Disease

## 2014-02-27 NOTE — Telephone Encounter (Signed)
Called and lmomtcb for cassidy.

## 2014-02-27 NOTE — Patient Instructions (Signed)
[name] [name]  02/27/2014   Your procedure is scheduled on:  03/05/2014  Report to [facility].  Follow the Signs to [place] at  0530      am  Call this number if you have problems the morning of surgery: [phone]   Remember:   Do not eat food or drink liquids after midnight.   Take these medicines the morning of surgery with A SIP OF WATER:    Do not wear jewelry,   Do not wear lotions, powders, or perfumes. , deodorant   Men may shave face and neck.  Do not bring valuables to the hospital.  Contacts, dentures or bridgework may not be worn into surgery.  Leave suitcase in the car. After surgery it may be brought to your room.  For patients admitted to the hospital, checkout time is 11:00 AM the day of  discharge.          Please read over the following fact sheets that you were given: [facility] [place] - Preparing for Surgery Before surgery, you can play an important role.  Because skin is not sterile, your skin needs to be as free of germs as possible.  You can reduce the number of germs on your skin by washing with CHG (chlorahexidine gluconate) soap before surgery.  CHG is an antiseptic cleaner which kills germs and bonds with the skin to continue killing germs even after washing. Please DO NOT use if you have an allergy to CHG or antibacterial soaps.  If your skin becomes reddened/irritated stop using the CHG and inform your nurse when you arrive at Short Stay. Do not shave (including legs and underarms) for at least 48 hours prior to the first CHG shower.  You may shave your face/neck. Please follow these instructions carefully:  1.  Shower with CHG Soap the night before surgery and the  morning of Surgery.  2.  If you choose to wash your hair, wash your hair first as usual with your  normal  shampoo.  3.  After you shampoo, rinse your hair and body thoroughly to remove the  shampoo.                           4.  Use CHG as you would any other liquid soap.  You  can apply chg directly  to the skin and wash                       Gently with a scrungie or clean washcloth.  5.  Apply the CHG Soap to your body ONLY FROM THE NECK DOWN.   Do not use on face/ open                           Wound or open sores. Avoid contact with eyes, ears mouth and genitals (private parts).                       Wash face,  Genitals (private parts) with your normal soap.             6.  Wash thoroughly, paying special attention to the area where your surgery  will be performed.  7.  Thoroughly rinse your body with warm water from the neck down.  8.  DO NOT shower/wash with your normal soap after using and rinsing off  the CHG Soap.                9.  Pat yourself dry with a clean towel.            10.  Wear clean pajamas.            11.  Place clean sheets on your bed the night of your first shower and do not  sleep with pets. Day of Surgery : Do not apply any lotions/deodorants the morning of surgery.  Please wear clean clothes to the hospital/surgery center.  FAILURE TO FOLLOW THESE INSTRUCTIONS MAY RESULT IN THE CANCELLATION OF YOUR SURGERY PATIENT SIGNATURE_________________________________  NURSE SIGNATURE__________________________________  ________________________________________________________________________ , coughing and deep breathing exercises, leg exercises

## 2014-02-28 ENCOUNTER — Ambulatory Visit (INDEPENDENT_AMBULATORY_CARE_PROVIDER_SITE_OTHER): Payer: Medicare Other | Admitting: Family Medicine

## 2014-02-28 DIAGNOSIS — I82409 Acute embolism and thrombosis of unspecified deep veins of unspecified lower extremity: Secondary | ICD-10-CM

## 2014-02-28 DIAGNOSIS — Z5181 Encounter for therapeutic drug level monitoring: Secondary | ICD-10-CM

## 2014-02-28 LAB — POCT INR: INR: 3.5

## 2014-02-28 NOTE — Telephone Encounter (Signed)
Called and lmomtcb for cassidy.    Per SN---yes ok to stop prior to surgery.   Primary is Dr. Georgina Peer seen 7/15 Cardiology is Dr. Marigene Ehlers last seen 7/31 SN last seen pt 3/25

## 2014-02-28 NOTE — Telephone Encounter (Signed)
LM x 2 with Primitivo Gauze to return call

## 2014-02-28 NOTE — Telephone Encounter (Signed)
Spoke with [name] [name] surgical clearance for Cysto retrograde pyelogram and bladder biopsies [name] to stop coumadin.  Pt has stopped the Coumadin today already -- [name] to have documentation from SN stating approval to be off for a few days prior to procedure 03/05/14.  Please advise Dr Lenna Gilford. Thanks.

## 2014-02-28 NOTE — Telephone Encounter (Signed)
[name] [name] called back and she is aware of SN recs.  Nothing further is needed.

## 2014-03-01 ENCOUNTER — Encounter (HOSPITAL_COMMUNITY): Payer: Self-pay

## 2014-03-01 ENCOUNTER — Ambulatory Visit (HOSPITAL_COMMUNITY)
Admission: RE | Admit: 2014-03-01 | Discharge: 2014-03-01 | Disposition: A | Discharge status: Home / Self Care | Payer: Medicare Other | Source: Ambulatory Visit | Attending: Anesthesiology | Admitting: Anesthesiology

## 2014-03-01 ENCOUNTER — Encounter (HOSPITAL_COMMUNITY): Payer: Self-pay | Admitting: Pharmacy Technician

## 2014-03-01 ENCOUNTER — Encounter (HOSPITAL_COMMUNITY)
Admission: RE | Admit: 2014-03-01 | Discharge: 2014-03-01 | Disposition: A | Discharge status: Home / Self Care | Payer: Medicare Other | Source: Ambulatory Visit | Attending: Urology | Admitting: Urology

## 2014-03-01 DIAGNOSIS — I10 Essential (primary) hypertension: Secondary | ICD-10-CM | POA: Diagnosis present

## 2014-03-01 DIAGNOSIS — Z01818 Encounter for other preprocedural examination: Secondary | ICD-10-CM | POA: Insufficient documentation

## 2014-03-01 LAB — BASIC METABOLIC PANEL
Anion gap: 12 (ref 5–15)
BUN: 20 mg/dL (ref 6–23)
CALCIUM: 10 mg/dL (ref 8.4–10.5)
CO2: 27 mEq/L (ref 19–32)
Chloride: 102 mEq/L (ref 96–112)
Creatinine, Ser: 1.07 mg/dL (ref 0.50–1.35)
GFR calc Af Amer: 69 mL/min — ABNORMAL LOW (ref >90)
GFR calc non Af Amer: 60 mL/min — ABNORMAL LOW (ref >90)
Glucose, Bld: 174 mg/dL — ABNORMAL HIGH (ref 70–99)
POTASSIUM: 4.3 meq/L (ref 3.7–5.3)
SODIUM: 141 meq/L (ref 137–147)

## 2014-03-01 LAB — APTT: aPTT: 46 seconds — ABNORMAL HIGH (ref 24–37)

## 2014-03-01 LAB — CBC
HCT: 34.3 % — ABNORMAL LOW (ref 39.0–52.0)
Hemoglobin: 11.8 g/dL — ABNORMAL LOW (ref 13.0–17.0)
MCH: 32 pg (ref 26.0–34.0)
MCHC: 34.4 g/dL (ref 30.0–36.0)
MCV: 93 fL (ref 78.0–100.0)
Platelets: 201 10*3/uL (ref 150–400)
RBC: 3.69 MIL/uL — ABNORMAL LOW (ref 4.22–5.81)
RDW: 13.5 % (ref 11.5–15.5)
WBC: 5.4 10*3/uL (ref 4.0–10.5)

## 2014-03-01 LAB — PROTIME-INR
INR: 2.81 — ABNORMAL HIGH (ref 0.00–1.49)
PROTHROMBIN TIME: 29.6 s — AB (ref 11.6–15.2)

## 2014-03-01 NOTE — Progress Notes (Signed)
Wife at [facility] since 02/27/2014.  Left Noemi Chapel a message for Dr Diona Fanti regarding this.

## 2014-03-01 NOTE — Progress Notes (Signed)
PT and PTT faxed via EPIC to Dr Diona Fanti.   EKG-12/30/13- EP[facility]  CXR- 03/01/14 [facility]

## 2014-03-01 NOTE — Progress Notes (Signed)
EKG- 12/30/2013- [facility]  LOV with Dr Aundra Dubin- 02/16/2014  EP[facility]  LOV with Dr Jeannine Kitten- 10/11/13 [facility]  ECHO- 2012-EPIC

## 2014-03-05 ENCOUNTER — Encounter (HOSPITAL_COMMUNITY): Admission: RE | Payer: Self-pay | Source: Ambulatory Visit

## 2014-03-05 ENCOUNTER — Ambulatory Visit (HOSPITAL_COMMUNITY): Admission: RE | Admit: 2014-03-05 | Payer: Medicare Other | Source: Ambulatory Visit | Admitting: Urology

## 2014-03-05 SURGERY — CYSTOSCOPY, WITH RETROGRADE PYELOGRAM
Anesthesia: General

## 2014-03-05 NOTE — H&P (Signed)
Urology History and Physical Exam  CC: Bladder cancer  HPI: 80 year old male presents for cystoscopy, bladder biopsies, bilateral retrograde ureteropyelograms and possible bilateral ureteroscopies. His history is as follows:    He did have endoscopic ultrasound which did not show a significant mass effect of his pancreatic head, which was noted on his hematuria CT. Hematuria CT revealed no evidence of upper urinary tract abnormalities. Cytology from 12/01/2013 was positive for cancer, however. He had a negative cystoscopy at that time.   UROTHELIAL CARCINOMA OF THE BLADDER  He underwent TURBT of a 2 cm urothelial carcinoma on the left side of the bladder which was performed on 09/30/2012. This was found incidentally on a CT scan. Pathology revealed papillary, noninvasive, low-grade urothelial carcinoma. Muscle was present and not involved with cancer. He was given mitomycin following the procedure.  Cystoscopy on 02/22/2013 was negative, but cytology/FISH was positive.  Cystoscopy on 06/28/2013 noted a 5--47mm solitary tumor on left side of bladder. This lesion was cauterized during cysto.  Because of a positive cytology, CT hematuria protocol was performed in May, 2015. That revealed a normal urinary tract, but a pancreatic head mass. He did have endoscopic ultrasound which did not show a significant mass effect of his pancreatic head, which was noted on his hematuria CT. Hematuria CT revealed no evidence of upper urinary tract abnormalities. Cytology from 12/01/2013 was positive for cancer, however. He had a negative cystoscopy at that time. However. The last 2 office visits have revealed positive cytologies.    ADENOCARCINOMA OF THE PROSTATE:  He is status post radiotherapy for prostate cancer in 2002. He was cared for by Dr. Terance Hart, then by Dr. Karsten Ro.  His PSAs have been essentially undetectable.   LUTS   He has persistent urgency, frequency and urgency incontinence. He is on both  Myrbetriq 50 mg daily as well as over-the-counter Gelnique. He still has significant leakage with this, necessitating 2 pads a day.    INCOMPLETE BLADDER EMPTYING   He was found to have PVR of 315ml in 2014 and was taught to do self cath 3x daily. He does this once to twice a day.           PMH: Past Medical History  Diagnosis Date  . Coronary atherosclerosis of unspecified type of vessel, native or graft   . Other primary cardiomyopathies   . Other left bundle branch block   . DVT (deep venous thrombosis)     Hx of it. -7 yrs ago  . Other and unspecified hyperlipidemia   . Type II or unspecified type diabetes mellitus without mention of complication, not stated as uncontrolled   . Diverticulosis of colon (without mention of hemorrhage)   . Irritable bowel syndrome   . Personal history of malignant neoplasm of prostate   . Spinal stenosis, lumbar region, without neurogenic claudication   . Unspecified hereditary and idiopathic peripheral neuropathy   . Nonischemic cardiomyopathy     EF 35-40%  . Coronary disease     nonobstructive coronary disease at catheterzation 2002.   Marland Kitchen DVT (deep venous thrombosis) left leg yrs ago    Hx of it on Coumadin therapy  . Sinus bradycardia     requiring discontinuation of Toprol   . Hypertension   . History of skin cancer   . Esophageal reflux     OCCASIONAL  . Bladder tumor   . Difficulty sleeping   . Colon polyp 2001    tubular adenoma   . Arthritis  hands  . Prostate cancer yrs ago    s/p seed implant  . Broken nose     01/2014    PSH: Past Surgical History  Procedure Laterality Date  . Appendectomy    . Knee arthroscopy      L knee. 1991 by Dr. Jenean Lindau  . Ercp with sphincterotomy and stone removal      5/93 by Dr. Fuller Plan  . Bilat inguinal hernia repair      1998 by Dr. March Rummage  . Radium seed implantation for prostate cancer      s/p. 1/00 by Dr. Terance Hart and Danny Lawless  . R cataract surgery Bilateral     2004 by Dr.  Herbert Deaner  . Repair of right recurrent right inguinal hernia      12/07 by Dr. Bubba Camp  . Basal cell cancer      L ear. s/p Moh's surgery 3/09 Dr. Harvel Quale  . Total knee arthroplasty      Left. 8/08 by Dr. Alvan Dame  . Tonsillectomy  09-28-11    child  . Cholecystectomy      1985 by Dr. Laurin Coder  . Total knee arthroplasty  10/05/2011    Procedure: TOTAL KNEE ARTHROPLASTY;  Surgeon: Gearlean Alf, MD;  Location: [facility];  Service: Orthopedics;  Laterality: Right;  . Transurethral resection of bladder tumor N/A 09/30/2012    Procedure: TRANSURETHRAL RESECTION OF BLADDER TUMOR (TURBT);  Surgeon: Franchot Gallo, MD;  Location: [facility];  Service: Urology;  Laterality: N/A;  WITH MITOMYCIN INSTILLATION   . Eus N/A 01/04/2014    Procedure: ESOPHAGEAL ENDOSCOPIC ULTRASOUND (EUS) RADIAL;  Surgeon: [name], MD;  Location: [facility];  Service: Endoscopy;  Laterality: N/A;    Allergies: No Known Allergies  Medications: No prescriptions prior to admission     Social History: History   Social History  . Marital Status: Married    Spouse Name: N/A    Number of Children: 0  . Years of Education: N/A   Occupational History  .     Social History Main Topics  . Smoking status: Former Smoker -- 0.25 packs/day for 5 years    Types: Cigarettes    Quit date: 07/20/1985  . Smokeless tobacco: Never Used  . Alcohol Use: No  . Drug Use: No  . Sexual Activity: Not on file   Other Topics Concern  . Not on file   Social History Narrative   Married, no children.     Family History: Family History  Problem Relation Age of Onset  . Throat cancer Brother   . Coronary artery disease Father     deceased    Review of Systems: Positive: BOO sx's Negative:   A further 10 point review of systems was negative except what is listed in the HPI.                  Physical Exam: @VITALS2 @ General: No acute distress.  Awake. Head:  Normocephalic.  Atraumatic. ENT:  EOMI.  Mucous membranes  moist Neck:  Supple.  No lymphadenopathy. CV:  S1 present. S2 present. Regular rate. Pulmonary: Equal effort bilaterally.  Clear to auscultation bilaterally. Abdomen: Soft.  Non tender to palpation. Skin:  Normal turgor.  No visible rash. Extremity: No gross deformity of bilateral upper extremities.  No gross deformity of                             lower extremities. Neurologic:  Alert. Appropriate mood.    Studies:  No results found for this basename: HGB, WBC, PLT,  in the last 72 hours  No results found for this basename: NA, K, CL, CO2, BUN, CREATININE, CALCIUM, MAGNESIUM, GFRNONAA, GFRAA,  in the last 72 hours   No results found for this basename: PT, INR, APTT,  in the last 72 hours   No components found with this basename: ABG,     Assessment:   NMIBC, status post TURBT 09/30/2012. Stage Ta, low-grade, noninvasive, papillary in nature. Most recent cytology/FISH in May and Aug. 2015 were positive despite negative hematuria CT as well as cysto.  Plan: Cysto, bladder biopsies, bilateral retrograde ureteropyelograms, possible bilateral ureterosopies

## 2014-03-06 ENCOUNTER — Other Ambulatory Visit: Payer: Self-pay | Admitting: Urology

## 2014-03-06 ENCOUNTER — Encounter (HOSPITAL_BASED_OUTPATIENT_CLINIC_OR_DEPARTMENT_OTHER): Payer: Self-pay | Admitting: *Deleted

## 2014-03-06 NOTE — Progress Notes (Signed)
NPO AFTER MN. ARRIVE AT 3086.  NEEDS PT/INR.  OTHER CURRENT LAB RESULTS IN CHART AND EPIC. WILL TAKE COREG, ZANTAC, AND NEXIUM AM DOS W/ SIPS OF WATER. PT WIFE JUST PASSED AWAY THIS PAST Monday 03/25/2017 AT [facility], HE IS VERY EMOTIONAL. CASE WAS SCHEDULED AT MAIN OR ON Monday 03-12-2014 BUT MOVED TO [place] ON Friday BECAUSE OF FUNERAL.  DR ROSE MDA REVIEWED CHART SINCE PT HAS CARDIOMYOPATHY WITH EF 25% PER DR MCLEAN NOTE , STATES OK TO PROCEED. PT IS OWER AT MAIN BECAUSE PT DOES HAVE ANYONE TO STAY WITH HIM AT HOME. HE DOES HAVE A FRIEND IS BRINGING HIM DOS AND PICKING UP NEXT DAY.

## 2014-03-06 NOTE — Anesthesia Preprocedure Evaluation (Addendum)
Anesthesia Evaluation  Patient identified by MRN, date of birth, ID band Patient awake    Reviewed: Allergy & Precautions, H&P , NPO status , Patient's Chart, lab work & pertinent test results, reviewed documented beta blocker date and time   Airway Mallampati: II TM Distance: >3 FB Neck ROM: Full    Dental no notable dental hx. (+) Partial Lower, Dental Advisory Given,    Pulmonary former smoker,  breath sounds clear to auscultation  Pulmonary exam normal       Cardiovascular Exercise Tolerance: Poor hypertension, Pt. on medications + CAD, + Peripheral Vascular Disease and +CHF + dysrhythmias Rhythm:Regular Rate:Normal  Cardiomyopathy w/ EF 25% per hx   Neuro/Psych  Neuromuscular disease    GI/Hepatic GERD-  Medicated,  Endo/Other  diabetes, Type 2  Renal/GU Renal diseaseBladder CA     Musculoskeletal  (+) Arthritis -, Osteoarthritis,    Abdominal   Peds  Hematology  (+) anemia ,   Anesthesia Other Findings   Reproductive/Obstetrics                      Anesthesia Physical Anesthesia Plan  ASA: IV  Anesthesia Plan: General   Post-op Pain Management:    Induction: Intravenous  Airway Management Planned: LMA  Additional Equipment:   Intra-op Plan:   Post-operative Plan: Extubation in OR  Informed Consent: I have reviewed the patients History and Physical, chart, labs and discussed the procedure including the risks, benefits and alternatives for the proposed anesthesia with the patient or authorized representative who has indicated his/her understanding and acceptance.   Dental advisory given  Plan Discussed with: CRNA  Anesthesia Plan Comments:        Anesthesia Quick Evaluation                                  Anesthesia Evaluation  Patient identified by MRN, date of birth, ID band Patient awake    Reviewed: Allergy & Precautions, H&P , NPO status , Patient's Chart, lab  work & pertinent test results, reviewed documented beta blocker date and time   Airway Mallampati: II TM Distance: >3 FB Neck ROM: Full    Dental no notable dental hx.    Pulmonary neg pulmonary ROS, former smoker,  breath sounds clear to auscultation  Pulmonary exam normal       Cardiovascular hypertension, Pt. on medications and Pt. on home beta blockers + CAD (Nonobstructive CAD per chart history), + Peripheral Vascular Disease and DVT negative cardio ROS  + dysrhythmias IIRhythm:Regular Rate:Normal  lbbb Nonischemic cardiomyopathy ef 25%   Neuro/Psych  Neuromuscular disease negative neurological ROS  negative psych ROS   GI/Hepatic negative GI ROS, Neg liver ROS, GERD-  Medicated,  Endo/Other  negative endocrine ROSdiabetes  Renal/GU negative Renal ROS  negative genitourinary   Musculoskeletal negative musculoskeletal ROS (+)   Abdominal   Peds negative pediatric ROS (+)  Hematology negative hematology ROS (+) anemia ,   Anesthesia Other Findings   Reproductive/Obstetrics negative OB ROS                        Anesthesia Physical  Anesthesia Plan  ASA: III  Anesthesia Plan: General   Post-op Pain Management:    Induction: Intravenous  Airway Management Planned: Oral ETT  Additional Equipment:   Intra-op Plan:   Post-operative Plan: Extubation in OR  Informed Consent: I have reviewed  the patients History and Physical, chart, labs and discussed the procedure including the risks, benefits and alternatives for the proposed anesthesia with the patient or authorized representative who has indicated his/her understanding and acceptance.   Dental advisory given  Plan Discussed with: CRNA  Anesthesia Plan Comments: (Pt with recent fall with non-displaced nasal bridge fracture. Negative head CT. Pt 4 days out from injury and doing well. Had long discussion with patient and Dr. Ardis Hughs about injuries and risk for displacing  nasal fracture while prone, or tissue breakdown from pressure during prone. )       Anesthesia Quick Evaluation                                   Anesthesia Evaluation  Patient identified by MRN, date of birth, ID band Patient awake    Reviewed: Allergy & Precautions, H&P , NPO status , Patient's Chart, lab work & pertinent test results  Airway Mallampati: II TM Distance: >3 FB Neck ROM: Full    Dental No notable dental hx.    Pulmonary neg pulmonary ROS,  breath sounds clear to auscultation  Pulmonary exam normal       Cardiovascular hypertension, Pt. on medications + CAD (nonobstructive) negative cardio ROS  + dysrhythmias IIRhythm:Regular Rate:Normal  lbbb Nonischemic cardiomyopathy ef 25%   Neuro/Psych negative neurological ROS  negative psych ROS   GI/Hepatic negative GI ROS, Neg liver ROS,   Endo/Other  negative endocrine ROSDiabetes mellitus-  Renal/GU negative Renal ROS  negative genitourinary   Musculoskeletal negative musculoskeletal ROS (+)   Abdominal   Peds negative pediatric ROS (+)  Hematology negative hematology ROS (+)   Anesthesia Other Findings   Reproductive/Obstetrics negative OB ROS                          Anesthesia Physical Anesthesia Plan  ASA: III  Anesthesia Plan:    Post-op Pain Management:    Induction:   Airway Management Planned:   Additional Equipment:   Intra-op Plan:   Post-operative Plan:   Informed Consent: I have reviewed the patients History and Physical, chart, labs and discussed the procedure including the risks, benefits and alternatives for the proposed anesthesia with the patient or authorized representative who has indicated his/her understanding and acceptance.   Dental advisory given  Plan Discussed with: CRNA  Anesthesia Plan Comments:         Anesthesia Quick Evaluation                                   Anesthesia Evaluation  Patient identified  by MRN, date of birth, ID band Patient awake    Reviewed: Allergy & Precautions, H&P , NPO status , Patient's Chart, lab work & pertinent test results, reviewed documented beta blocker date and time   Airway Mallampati: II TM Distance: >3 FB Neck ROM: Full    Dental no notable dental hx.    Pulmonary neg pulmonary ROS, former smoker,  breath sounds clear to auscultation  Pulmonary exam normal       Cardiovascular hypertension, Pt. on medications and Pt. on home beta blockers + CAD (Nonobstructive CAD per chart history), + Peripheral Vascular Disease and DVT negative cardio ROS  + dysrhythmias IIRhythm:Regular Rate:Normal  lbbb Nonischemic cardiomyopathy ef 25%   Neuro/Psych  Neuromuscular disease negative  neurological ROS  negative psych ROS   GI/Hepatic negative GI ROS, Neg liver ROS, GERD-  Medicated,  Endo/Other  negative endocrine ROSdiabetes  Renal/GU negative Renal ROS  negative genitourinary   Musculoskeletal negative musculoskeletal ROS (+)   Abdominal   Peds negative pediatric ROS (+)  Hematology negative hematology ROS (+) anemia ,   Anesthesia Other Findings   Reproductive/Obstetrics negative OB ROS                        Anesthesia Physical  Anesthesia Plan  ASA: III  Anesthesia Plan: General   Post-op Pain Management:    Induction: Intravenous  Airway Management Planned: Oral ETT  Additional Equipment:   Intra-op Plan:   Post-operative Plan: Extubation in OR  Informed Consent: I have reviewed the patients History and Physical, chart, labs and discussed the procedure including the risks, benefits and alternatives for the proposed anesthesia with the patient or authorized representative who has indicated his/her understanding and acceptance.   Dental advisory given  Plan Discussed with: CRNA  Anesthesia Plan Comments: (Pt with recent fall with non-displaced nasal bridge fracture. Negative head CT.  Pt 4 days out from injury and doing well. Had long discussion with patient and Dr. Ardis Hughs about injuries and risk for displacing nasal fracture while prone, or tissue breakdown from pressure during prone. )       Anesthesia Quick Evaluation

## 2014-03-07 ENCOUNTER — Other Ambulatory Visit: Payer: Self-pay | Admitting: Urology

## 2014-03-09 ENCOUNTER — Observation Stay (HOSPITAL_BASED_OUTPATIENT_CLINIC_OR_DEPARTMENT_OTHER)
Admission: RE | Admit: 2014-03-09 | Discharge: 2014-03-10 | Disposition: A | Discharge status: Home / Self Care | Payer: Medicare Other | Source: Ambulatory Visit | Attending: Urology | Admitting: Urology

## 2014-03-09 ENCOUNTER — Ambulatory Visit (HOSPITAL_BASED_OUTPATIENT_CLINIC_OR_DEPARTMENT_OTHER): Payer: Medicare Other | Admitting: Anesthesiology

## 2014-03-09 ENCOUNTER — Encounter (HOSPITAL_BASED_OUTPATIENT_CLINIC_OR_DEPARTMENT_OTHER): Payer: Medicare Other | Admitting: Anesthesiology

## 2014-03-09 ENCOUNTER — Encounter (HOSPITAL_BASED_OUTPATIENT_CLINIC_OR_DEPARTMENT_OTHER): Payer: Self-pay

## 2014-03-09 ENCOUNTER — Encounter (HOSPITAL_COMMUNITY)
Admission: RE | Disposition: A | Discharge status: Home / Self Care | Payer: Self-pay | Source: Ambulatory Visit | Attending: Urology

## 2014-03-09 DIAGNOSIS — M5030 Other cervical disc degeneration, unspecified cervical region: Secondary | ICD-10-CM | POA: Insufficient documentation

## 2014-03-09 DIAGNOSIS — Z0389 Encounter for observation for other suspected diseases and conditions ruled out: Principal | ICD-10-CM | POA: Insufficient documentation

## 2014-03-09 DIAGNOSIS — Z96659 Presence of unspecified artificial knee joint: Secondary | ICD-10-CM | POA: Diagnosis not present

## 2014-03-09 DIAGNOSIS — I509 Heart failure, unspecified: Secondary | ICD-10-CM | POA: Diagnosis not present

## 2014-03-09 DIAGNOSIS — E119 Type 2 diabetes mellitus without complications: Secondary | ICD-10-CM | POA: Diagnosis not present

## 2014-03-09 DIAGNOSIS — I2510 Atherosclerotic heart disease of native coronary artery without angina pectoris: Secondary | ICD-10-CM | POA: Insufficient documentation

## 2014-03-09 DIAGNOSIS — M51379 Other intervertebral disc degeneration, lumbosacral region without mention of lumbar back pain or lower extremity pain: Secondary | ICD-10-CM | POA: Insufficient documentation

## 2014-03-09 DIAGNOSIS — Z87891 Personal history of nicotine dependence: Secondary | ICD-10-CM | POA: Insufficient documentation

## 2014-03-09 DIAGNOSIS — Z8679 Personal history of other diseases of the circulatory system: Secondary | ICD-10-CM | POA: Diagnosis not present

## 2014-03-09 DIAGNOSIS — Z86718 Personal history of other venous thrombosis and embolism: Secondary | ICD-10-CM | POA: Insufficient documentation

## 2014-03-09 DIAGNOSIS — K219 Gastro-esophageal reflux disease without esophagitis: Secondary | ICD-10-CM | POA: Diagnosis not present

## 2014-03-09 DIAGNOSIS — Z8551 Personal history of malignant neoplasm of bladder: Secondary | ICD-10-CM | POA: Diagnosis not present

## 2014-03-09 DIAGNOSIS — Z923 Personal history of irradiation: Secondary | ICD-10-CM | POA: Insufficient documentation

## 2014-03-09 DIAGNOSIS — M5137 Other intervertebral disc degeneration, lumbosacral region: Secondary | ICD-10-CM | POA: Insufficient documentation

## 2014-03-09 DIAGNOSIS — M1990 Unspecified osteoarthritis, unspecified site: Secondary | ICD-10-CM | POA: Diagnosis not present

## 2014-03-09 DIAGNOSIS — C679 Malignant neoplasm of bladder, unspecified: Secondary | ICD-10-CM | POA: Diagnosis present

## 2014-03-09 DIAGNOSIS — I10 Essential (primary) hypertension: Secondary | ICD-10-CM | POA: Insufficient documentation

## 2014-03-09 HISTORY — DX: Type 2 diabetes mellitus without complications: E11.9

## 2014-03-09 HISTORY — DX: Chronic systolic (congestive) heart failure: I50.22

## 2014-03-09 HISTORY — DX: Gastro-esophageal reflux disease without esophagitis: K21.9

## 2014-03-09 HISTORY — DX: Long term (current) use of anticoagulants: Z79.01

## 2014-03-09 HISTORY — DX: Presence of external hearing-aid: Z97.4

## 2014-03-09 HISTORY — DX: Malignant neoplasm of bladder, unspecified: C67.9

## 2014-03-09 HISTORY — DX: Presence of spectacles and contact lenses: Z97.3

## 2014-03-09 HISTORY — PX: CYSTOSCOPY W/ RETROGRADES: SHX1426

## 2014-03-09 HISTORY — DX: Other specified postprocedural states: Z85.828

## 2014-03-09 HISTORY — DX: Unsteadiness on feet: R26.81

## 2014-03-09 HISTORY — DX: Reserved for concepts with insufficient information to code with codable children: IMO0002

## 2014-03-09 HISTORY — DX: Atherosclerotic heart disease of native coronary artery without angina pectoris: I25.10

## 2014-03-09 HISTORY — DX: Personal history of colonic polyps: Z86.010

## 2014-03-09 HISTORY — DX: Unspecified symptoms and signs involving the genitourinary system: R39.9

## 2014-03-09 HISTORY — DX: Left bundle-branch block, unspecified: I44.7

## 2014-03-09 HISTORY — DX: Personal history of other malignant neoplasm of skin: Z98.890

## 2014-03-09 HISTORY — DX: Personal history of adenomatous and serrated colon polyps: Z86.0101

## 2014-03-09 HISTORY — DX: Personal history of (healed) traumatic fracture: Z87.81

## 2014-03-09 HISTORY — DX: Personal history of other venous thrombosis and embolism: Z86.718

## 2014-03-09 LAB — PROTIME-INR
INR: 1.14 (ref 0.00–1.49)
Prothrombin Time: 14.6 s (ref 11.6–15.2)

## 2014-03-09 LAB — GLUCOSE, CAPILLARY
Glucose-Capillary: 153 mg/dL — ABNORMAL HIGH (ref 70–99)
Glucose-Capillary: 147 mg/dL — ABNORMAL HIGH (ref 70–99)

## 2014-03-09 SURGERY — CYSTOSCOPY, WITH RETROGRADE PYELOGRAM
Anesthesia: General | Site: Bladder | Laterality: Bilateral

## 2014-03-09 MED ORDER — OXYBUTYNIN CHLORIDE 5 MG PO TABS
5.0000 mg | ORAL_TABLET | Freq: Three times a day (TID) | ORAL | Status: DC | PRN
  Filled 2014-03-09: qty 1

## 2014-03-09 MED ORDER — PANTOPRAZOLE SODIUM 40 MG PO TBEC
40.0000 mg | DELAYED_RELEASE_TABLET | Freq: Every day | ORAL | Status: DC
  Administered 2014-03-09 – 2014-03-10 (×2): 40 mg via ORAL
  Filled 2014-03-09 (×3): qty 1

## 2014-03-09 MED ORDER — GLUCERNA SHAKE PO LIQD
237.0000 mL | ORAL | Status: DC
  Filled 2014-03-09: qty 237

## 2014-03-09 MED ORDER — IOHEXOL 350 MG/ML SOLN
INTRAVENOUS | Status: DC | PRN
  Administered 2014-03-09: 22 mL via URETHRAL

## 2014-03-09 MED ORDER — HYDROCODONE-ACETAMINOPHEN 5-325 MG PO TABS
1.0000 | ORAL_TABLET | Freq: Four times a day (QID) | ORAL | Status: DC | PRN
  Administered 2014-03-09: 1 via ORAL
  Filled 2014-03-09 (×2): qty 1

## 2014-03-09 MED ORDER — PROMETHAZINE HCL 25 MG/ML IJ SOLN
6.2500 mg | INTRAMUSCULAR | Status: DC | PRN
  Filled 2014-03-09: qty 1

## 2014-03-09 MED ORDER — SIMVASTATIN 5 MG PO TABS
5.0000 mg | ORAL_TABLET | Freq: Every day | ORAL | Status: DC
  Administered 2014-03-09: 5 mg via ORAL
  Filled 2014-03-09 (×3): qty 1

## 2014-03-09 MED ORDER — CEFAZOLIN SODIUM 1-5 GM-% IV SOLN
1.0000 g | INTRAVENOUS | Status: DC
  Filled 2014-03-09: qty 50

## 2014-03-09 MED ORDER — MEPERIDINE HCL 25 MG/ML IJ SOLN
6.2500 mg | INTRAMUSCULAR | Status: DC | PRN
  Filled 2014-03-09: qty 1

## 2014-03-09 MED ORDER — SODIUM CHLORIDE 0.45 % IV SOLN
INTRAVENOUS | Status: DC
  Administered 2014-03-09: 15:00:00 via INTRAVENOUS
  Filled 2014-03-09: qty 1000

## 2014-03-09 MED ORDER — STERILE WATER FOR IRRIGATION IR SOLN
Status: DC | PRN
  Administered 2014-03-09: 3000 mL

## 2014-03-09 MED ORDER — ONDANSETRON HCL 4 MG/2ML IJ SOLN
INTRAMUSCULAR | Status: DC | PRN
Start: 2014-03-09 — End: 2014-03-09
  Administered 2014-03-09: 4 mg via INTRAVENOUS

## 2014-03-09 MED ORDER — CEPHALEXIN 500 MG PO CAPS: 500.0000 mg | ORAL_CAPSULE | Freq: Four times a day (QID) | ORAL | Status: DC

## 2014-03-09 MED ORDER — ACETAMINOPHEN 325 MG PO TABS
650.0000 mg | ORAL_TABLET | ORAL | Status: DC | PRN
  Filled 2014-03-09 (×2): qty 2

## 2014-03-09 MED ORDER — LIDOCAINE HCL (CARDIAC) 20 MG/ML IV SOLN
INTRAVENOUS | Status: DC | PRN
  Administered 2014-03-09: 60 mg via INTRAVENOUS

## 2014-03-09 MED ORDER — POLYETHYLENE GLYCOL 3350 17 G PO PACK
17.0000 g | PACK | Freq: Two times a day (BID) | ORAL | Status: DC | PRN
  Filled 2014-03-09: qty 1

## 2014-03-09 MED ORDER — PHENYLEPHRINE HCL 10 MG/ML IJ SOLN
10.0000 mg | INTRAVENOUS | Status: DC | PRN
  Administered 2014-03-09: 25 ug/min via INTRAVENOUS

## 2014-03-09 MED ORDER — HYDROMORPHONE HCL PF 1 MG/ML IJ SOLN
0.2500 mg | INTRAMUSCULAR | Status: DC | PRN
  Filled 2014-03-09: qty 1

## 2014-03-09 MED ORDER — CEPHALEXIN 500 MG PO CAPS
500.0000 mg | ORAL_CAPSULE | Freq: Two times a day (BID) | ORAL | Status: DC
  Administered 2014-03-09 – 2014-03-10 (×2): 500 mg via ORAL
  Filled 2014-03-09 (×4): qty 1

## 2014-03-09 MED ORDER — ZOLPIDEM TARTRATE 10 MG PO TABS
10.0000 mg | ORAL_TABLET | Freq: Every evening | ORAL | Status: DC | PRN
  Administered 2014-03-09: 10 mg via ORAL
  Filled 2014-03-09 (×2): qty 1

## 2014-03-09 MED ORDER — ACETAMINOPHEN 10 MG/ML IV SOLN
INTRAVENOUS | Status: DC | PRN
  Administered 2014-03-09: 1000 mg via INTRAVENOUS

## 2014-03-09 MED ORDER — ONDANSETRON HCL 4 MG/2ML IJ SOLN
4.0000 mg | INTRAMUSCULAR | Status: DC | PRN
  Administered 2014-03-09: 4 mg via INTRAVENOUS
  Filled 2014-03-09 (×2): qty 2

## 2014-03-09 MED ORDER — PREGABALIN 75 MG PO CAPS
75.0000 mg | ORAL_CAPSULE | Freq: Every day | ORAL | Status: DC
  Administered 2014-03-09 – 2014-03-10 (×2): 75 mg via ORAL
  Filled 2014-03-09 (×3): qty 1

## 2014-03-09 MED ORDER — ONDANSETRON HCL 4 MG/2ML IJ SOLN
4.0000 mg | Freq: Once | INTRAMUSCULAR | Status: DC | PRN
  Filled 2014-03-09: qty 2

## 2014-03-09 MED ORDER — NITROGLYCERIN 0.4 MG SL SUBL
0.4000 mg | SUBLINGUAL_TABLET | SUBLINGUAL | Status: DC | PRN
  Filled 2014-03-09: qty 25

## 2014-03-09 MED ORDER — MIDAZOLAM HCL 2 MG/2ML IJ SOLN
INTRAMUSCULAR | Status: AC
  Filled 2014-03-09: qty 2

## 2014-03-09 MED ORDER — OXYCODONE HCL 5 MG PO TABS
5.0000 mg | ORAL_TABLET | Freq: Once | ORAL | Status: DC | PRN
  Filled 2014-03-09: qty 1

## 2014-03-09 MED ORDER — PROPOFOL INFUSION 10 MG/ML OPTIME
INTRAVENOUS | Status: DC | PRN
  Administered 2014-03-09: 100 mL via INTRAVENOUS

## 2014-03-09 MED ORDER — CARVEDILOL 12.5 MG PO TABS
12.5000 mg | ORAL_TABLET | Freq: Two times a day (BID) | ORAL | Status: DC
  Administered 2014-03-09 – 2014-03-10 (×2): 12.5 mg via ORAL
  Filled 2014-03-09 (×5): qty 1

## 2014-03-09 MED ORDER — OXYCODONE HCL 5 MG/5ML PO SOLN
5.0000 mg | Freq: Once | ORAL | Status: DC | PRN
  Filled 2014-03-09: qty 5

## 2014-03-09 MED ORDER — DOCUSATE SODIUM 100 MG PO CAPS
100.0000 mg | ORAL_CAPSULE | Freq: Two times a day (BID) | ORAL | Status: DC
  Administered 2014-03-09 – 2014-03-10 (×3): 100 mg via ORAL
  Filled 2014-03-09 (×5): qty 1

## 2014-03-09 MED ORDER — DEXAMETHASONE SODIUM PHOSPHATE 10 MG/ML IJ SOLN
INTRAMUSCULAR | Status: DC | PRN
  Administered 2014-03-09: 10 mg via INTRAVENOUS

## 2014-03-09 MED ORDER — FUROSEMIDE 40 MG PO TABS
40.0000 mg | ORAL_TABLET | Freq: Every morning | ORAL | Status: DC
  Administered 2014-03-09 – 2014-03-10 (×2): 40 mg via ORAL
  Filled 2014-03-09 (×3): qty 1

## 2014-03-09 MED ORDER — FENTANYL CITRATE 0.05 MG/ML IJ SOLN
INTRAMUSCULAR | Status: AC
  Filled 2014-03-09: qty 4

## 2014-03-09 MED ORDER — SODIUM CHLORIDE 0.9 % IR SOLN
Status: DC | PRN
  Administered 2014-03-09: 1000 mL

## 2014-03-09 MED ORDER — LACTATED RINGERS IV SOLN
INTRAVENOUS | Status: DC
  Administered 2014-03-09 (×2): via INTRAVENOUS
  Filled 2014-03-09: qty 1000

## 2014-03-09 MED ORDER — CEFAZOLIN SODIUM-DEXTROSE 2-3 GM-% IV SOLR
2.0000 g | INTRAVENOUS | Status: AC
  Administered 2014-03-09: 2 g via INTRAVENOUS
  Filled 2014-03-09: qty 50

## 2014-03-09 SURGICAL SUPPLY — 33 items
ADAPTER CATH URET PLST 4-6FR (CATHETERS) IMPLANT
ADPR CATH URET STRL DISP 4-6FR (CATHETERS)
BAG DRAIN URO-CYSTO SKYTR STRL (DRAIN) ×3 IMPLANT
BAG DRN UROCATH (DRAIN) ×1
BAG URINE DRAINAGE (UROLOGICAL SUPPLIES) ×2 IMPLANT
CANISTER SUCT LVC 12 LTR MEDI- (MISCELLANEOUS) ×2 IMPLANT
CATH FOLEY 2WAY SLVR  5CC 18FR (CATHETERS) ×2
CATH FOLEY 2WAY SLVR 5CC 18FR (CATHETERS) IMPLANT
CATH INTERMIT  6FR 70CM (CATHETERS) IMPLANT
CATH URET 5FR 28IN CONE TIP (BALLOONS)
CATH URET 5FR 28IN OPEN ENDED (CATHETERS) IMPLANT
CATH URET 5FR 70CM CONE TIP (BALLOONS) IMPLANT
CLOTH BEACON ORANGE TIMEOUT ST (SAFETY) ×3 IMPLANT
CONT SPECI 4OZ STER CLIK (MISCELLANEOUS) ×4 IMPLANT
DRAPE CAMERA CLOSED 9X96 (DRAPES) ×3 IMPLANT
GLOVE BIO SURGEON STRL SZ8 (GLOVE) ×3 IMPLANT
GLOVE BIOGEL M 6.5 STRL (GLOVE) ×2 IMPLANT
GLOVE BIOGEL PI IND STRL 6.5 (GLOVE) IMPLANT
GLOVE BIOGEL PI INDICATOR 6.5 (GLOVE) ×4
GOWN PREVENTION PLUS LG XLONG (DISPOSABLE) ×1 IMPLANT
GOWN STRL REIN XL XLG (GOWN DISPOSABLE) ×1 IMPLANT
GOWN STRL REUS W/TWL LRG LVL3 (GOWN DISPOSABLE) ×2 IMPLANT
GOWN STRL REUS W/TWL XL LVL3 (GOWN DISPOSABLE) ×2 IMPLANT
GUIDEWIRE 0.038 PTFE COATED (WIRE) IMPLANT
GUIDEWIRE ANG ZIPWIRE 038X150 (WIRE) IMPLANT
GUIDEWIRE STR DUAL SENSOR (WIRE) IMPLANT
HOLDER FOLEY CATH W/STRAP (MISCELLANEOUS) ×2 IMPLANT
NS IRRIG 500ML POUR BTL (IV SOLUTION) IMPLANT
PACK CYSTOSCOPY (CUSTOM PROCEDURE TRAY) ×3 IMPLANT
SYR CONTROL 10ML LL (SYRINGE) ×2 IMPLANT
WATER STERILE IRR 3000ML UROMA (IV SOLUTION) ×2 IMPLANT
WATER STERILE IRR 500ML POUR (IV SOLUTION) ×2 IMPLANT
WIRE COONS/BENSON .038X145CM (WIRE) ×2 IMPLANT

## 2014-03-09 NOTE — Progress Notes (Signed)
Pt was lying in bed when I arrived. He talked about his surgery and said he had had cancer before. Pt said his wife passed Sunday and was buried ystrdy. He talked about her lengthy illness and passing after 36 yr marriage. Pt was grateful for visit but had to take a phone call. pls call if additional spt is needed. [phone]. Ernest Haber Chaplain  03/09/14 1500  Clinical Encounter Type  Visited With Patient

## 2014-03-09 NOTE — Anesthesia Postprocedure Evaluation (Signed)
Anesthesia Post Note  Patient: [name] [name]  Procedure(s) Performed: Procedure(s) (LRB): CYSTOSCOPY WITH BILATERAL RETROGRADE PYELOGRAM BLADDER BX AND RENAL WASHINGS. (Bilateral)  Anesthesia type: General  Patient location: PACU  Post pain: Pain level controlled  Post assessment: Post-op Vital signs reviewed  Last Vitals: BP 108/46  Pulse 64  Temp(Src) 37.7 C (Oral)  Resp 16  Wt 172 lb (78.019 kg)  SpO2 93%  Post vital signs: Reviewed  Level of consciousness: sedated  Complications: No apparent anesthesia complications

## 2014-03-09 NOTE — Op Note (Signed)
Preoperative diagnosis: History of bladder cancer with positive cytologies  Postoperative diagnosis: Same, with normal findings   Procedure: Anesthetic cystoscopy, bilateral retrograde ureteropyelograms with interpretive fluoroscopy, bilateral renal washings, latter barbotaged, bladder biopsies    Surgeon: Lillette Boxer. Demetrias Goodbar, M.D.   Anesthesia: Gen.   Complications: None  Specimen(s): 1. Bladder washings for cytology 2. Bilateral renal washings 3. Random bladder biopsies  Drain(s): 54 French Foley catheter  Indications: 80 year old male status post TURBT in 2014. He has had positive cytologies despite negative cystoscopies recently. He presents at this time for cystoscopy, bladder biopsy, bladder barbotage, bilateral retrogrades and possible ureteroscopy.    Technique and findings: The patient was identified in the holding area. He received preoperative IV Ancef. He was taken to the operating room where general anesthetic was administered with the LMA. He was placed in the dorsolithotomy position. Genitalia and perineum were prepped and draped. Proper timeout was performed.  A 22 French panendoscope was advanced through his urethra. Urethra was without strictures or lesions. His prostatic urethra was nonobstructive. The bladder was entered and inspected circumferentially both with the 12 and 70 lenses. Ureteral orifices were normal in configuration and location. There was  Pale scar tissue on the lateral left bladder wall consistent with prior resection. There were no flat or papillary lesions. No areas of erythema were noted. There were scattered trabeculations. No stones were noted.  Right retrograde ureteropyelogram was performed using a 6 [place] open-ended catheter. The ureter was patent throughout, with normal with, without evidence of filling defects or stricture. There was a mild vascular compression in the mid ureter. Pyelo-calyceal system was normal. No filling defects were seen.  The open-ended catheter was advanced into the right renal pelvis using the help of a guidewire. Washings were obtained with saline and sent as "right renal washings".  Left retrograde ureteropyelogram was also performed using the same 6 [place] open-ended catheter. Again, the ureter was patent, without evidence of stricture or filling defects. There was no hydronephrosis. The pyelo-calyceal system on the left was normal, without evidence of filling defects or hydronephrosis. The open-ended catheter was advanced into the renal pelvis and washings were again taken and sent as "left renal washings" for cytology.  This point, the open-ended catheter was removed. I biopsied for areas of the bladder-the trigone, the left and right lateral walls and posteriorly in the midline. These were all sent in the same cup as "random bladder biopsies". Biopsy sites were cauterized. Hemostasis was adequate.  At this point, the scope was removed. I placed an 14 French Foley catheter and hooked up to a bedside bag.  At this point, the procedure was terminated. The patient was awakened and taken to the PACU in stable condition. He tolerated the procedure well.

## 2014-03-09 NOTE — Transfer of Care (Signed)
Immediate Anesthesia Transfer of Care Note  Patient: [name] [name]  Procedure(s) Performed: Procedure(s): CYSTOSCOPY WITH BILATERAL RETROGRADE PYELOGRAM BLADDER BX AND RENAL WASHINGS. (Bilateral)  Patient Location: PACU  Anesthesia Type:General  Level of Consciousness: awake, alert , oriented and patient cooperative  Airway & Oxygen Therapy: Patient Spontanous Breathing and Patient connected to nasal cannula oxygen  Post-op Assessment: Report given to PACU RN and Post -op Vital signs reviewed and stable  Post vital signs: Reviewed and stable  Complications: No apparent anesthesia complications

## 2014-03-09 NOTE — Anesthesia Procedure Notes (Signed)
Procedure Name: LMA Insertion Date/Time: 03/09/2014 10:38 AM Performed by: Wanita Chamberlain Pre-anesthesia Checklist: Patient identified, Timeout performed, Emergency Drugs available, Suction available and Patient being monitored Patient Re-evaluated:Patient Re-evaluated prior to inductionOxygen Delivery Method: Circle system utilized Preoxygenation: Pre-oxygenation with 100% oxygen Intubation Type: IV induction Ventilation: Mask ventilation without difficulty LMA: LMA inserted LMA Size: 5.0 Number of attempts: 1 Airway Equipment and Method: Bite block Placement Confirmation: positive ETCO2

## 2014-03-09 NOTE — Interval H&P Note (Signed)
History and Physical Interval Note:  03/09/2014 10:28 AM  [name] [name]  has presented today for surgery, with the diagnosis of BLADDER CANCER   The various methods of treatment have been discussed with the patient and family. After consideration of risks, benefits and other options for treatment, the patient has consented to  Procedure(s): CYSTOSCOPY WITH BILATERAL RETROGRADE PYELOGRAM BLADDER BX (Bilateral) as a surgical intervention .  The patient's history has been reviewed, patient examined, no change in status, stable for surgery.  I have reviewed the patient's chart and labs.  Questions were answered to the patient's satisfaction.     Jorja Loa

## 2014-03-09 NOTE — Progress Notes (Signed)
Nutrition Brief Note  Patient identified on the Malnutrition Screening Tool (MST) Report  Wt Readings from Last 15 Encounters:  03/09/14 171 lb 8.3 oz (77.8 kg)  03/09/14 171 lb 8.3 oz (77.8 kg)  03/01/14 172 lb (78.019 kg)  02/16/14 171 lb (77.565 kg)  01/31/14 174 lb (78.926 kg)  12/30/13 174 lb (78.926 kg)  12/20/13 183 lb (83.008 kg)  12/19/13 185 lb (83.915 kg)  10/11/13 187 lb 3.2 oz (84.913 kg)  09/21/13 184 lb (83.462 kg)  09/20/13 184 lb 12.8 oz (83.825 kg)  04/28/13 184 lb 6.4 oz (83.643 kg)  04/13/13 187 lb (84.823 kg)  02/20/13 190 lb 6.4 oz (86.365 kg)  10/10/12 194 lb 9.6 oz (88.27 kg)    Body mass index is 24.61 kg/(m^2). Patient meets criteria for Normal based on current BMI.   Current diet order is Regular. Pt had not yet ordered dinner meal as he was admitted after lunch. Was consuming crackers and peanut butter during RD visit. Labs and medications reviewed.   Pt endorsed an unintentional wt loss of 10-15 lbs for past 3 months (6.5% body weight loss, non-severe for time frame). Has been eating less d/t wife being hospitalized and depression of her recent passing. Pt in agreement to try to increase snacks, and was wiling to try Glucerna shakes once daily.   No additional nutrition interventions warranted at this time. If nutrition issues arise, please consult RD.   Atlee Abide MS RD LDN Clinical Dietitian [phone]

## 2014-03-09 NOTE — H&P (Signed)
Urology History and Physical Exam  CC: Bladder cancer  HPI: 80 year old male presents for cystoscopy, bladder biopsies, bilateral retrograde pyelograms. His history is as follows:   He underwent TURBT of a 2 cm urothelial carcinoma on the left side of the bladder which was performed on 09/30/2012. This was found incidentally on a CT scan. Pathology revealed papillary, noninvasive, low-grade urothelial carcinoma. Muscle was present and not involved with cancer. He was given mitomycin following the procedure.  Cystoscopy on 02/22/2013 was negative, but cytology/FISH was positive.  Cystoscopy on 06/28/2013 noted a 5--60mm solitary tumor on left side of bladder. This lesion was cauterized during cysto.  Because of a positive cytology, CT hematuria protocol was performed in May, 2015. That revealed a normal urinary tract, but a pancreatic head mass.  Cysto in 01/2014 was negative, but again, cytology was positive.  He is also followed for the below issues:  ADENOCARCINOMA OF THE PROSTATE:  He is status post radiotherapy for prostate cancer in 2002. He was cared for by Dr. Terance Hart, then by Dr. Karsten Ro.  His PSAs have been essentially undetectable.   LUTS  He has persistent urgency, frequency and urgency incontinence. He is on both Myrbetriq 50 mg daily as well as over-the-counter Gelnique. He still has significant leakage with this, necessitating 2 pads a day.   INCOMPLETE BLADDER EMPTYING  He was found to have PVR of 387ml in 2014 and was taught to do self cath 3x daily. He does this once to twice a day.   PMH: Past Medical History  Diagnosis Date  . Diverticulosis of colon (without mention of hemorrhage)   . Irritable bowel syndrome   . Personal history of malignant neoplasm of prostate     s/p  radioactive seed implants 2000  . Unspecified hereditary and idiopathic peripheral neuropathy   . Nonischemic cardiomyopathy cardiologist-  dr Aundra Dubin    EF 25%  per last echo 2012 and  cardiologist note--  2007-  ef  40-45%;  2008- ef 35-40%;  2010- ef 30-35%;  2012- ef 25%  . Sinus bradycardia   . Hypertension   . Arthritis     hands  . Hyperlipidemia   . History of DVT of lower extremity     2002  ,   left leg  . History of adenomatous polyp of colon     2001  . History of basal cell carcinoma excision   . Anticoagulated on Coumadin   . Type 2 diabetes, diet controlled   . GERD (gastroesophageal reflux disease)   . LBBB (left bundle branch block)   . Wears glasses   . Wears hearing aid     BILATERAL  . Unsteady gait   . DDD (degenerative disc disease)     cervical and lumbar  . DJD (degenerative joint disease)     hips  . Lower urinary tract symptoms (LUTS)   . 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 Coronary artery disease     per cath 2002  non-obstructive cad  . Chronic systolic heart failure   . H/O fracture of nose     12-30-2013-  pt fell at home open nose bone fx    PSH: Past Surgical History  Procedure Laterality Date  . Knee arthroscopy Left 1991  . Ercp with sphincterotomy and stone removal  05/ 1993  &  05-11-2006  . Total knee arthroplasty  10/05/2011    Procedure: TOTAL KNEE ARTHROPLASTY;  Surgeon: Gearlean Alf, MD;  Location: [facility];  Service: Orthopedics;  Laterality: Right;  . Transurethral resection of bladder tumor N/A 09/30/2012    Procedure: TRANSURETHRAL RESECTION OF BLADDER TUMOR (TURBT);  Surgeon: Franchot Gallo, MD;  Location: [facility];  Service: Urology;  Laterality: N/A;  WITH MITOMYCIN INSTILLATION   . Eus N/A 01/04/2014    Procedure: ESOPHAGEAL ENDOSCOPIC ULTRASOUND (EUS) RADIAL;  Surgeon: [name], MD;  Location: [facility];  Service: Endoscopy;  Laterality: N/A;  . Radioactive prostate seed implants  01/ 2000  . Mohs surgery  03/ 2009    LEFT EAR BASAL CELL CARCINOMA  . Tonsillectomy  AS CHILD  . Cholecystectomy open   1985  . Appendectomy  child  . Inguinal hernia repair Bilateral 1998    recurrent RIH  w/ repair 06-21-2006  . Total knee arthroplasty Left 02-21-2007    AND 04-21-2007  CLOSED MANIPULATION  . Cataract extraction w/ intraocular lens implant Right 2004  . Lumbar spine surgery  X2  . Cardiac catheterization  06-14-2001  dr Darnell Level brodie    non-obstructive CAD/  LM 30%,  pLAD 50%,  mLAD 30%,  RCA  &  CFX  irregularites ,  global hypokinesis,  ef 40%  . Cardiovascular stress test  02-07-2007   dr Darnell Level brodie    negative for ischemia,  low risk myoview perfusion study  . Transthoracic echocardiogram  01-28-2011    moderate LVH/  ef 25%/  diffuse hypokinesis/  mild LAE/  mild TR/  trivial AR & MR    Allergies: No Known Allergies  Medications: No prescriptions prior to admission     Social History: History   Social History  . Marital Status: Widowed    Spouse Name: N/A    Number of Children: 0  . Years of Education: N/A   Occupational History  .     Social History Main Topics  . Smoking status: Former Smoker -- 1.00 packs/day for 10 years    Types: Cigarettes    Quit date: 07/20/1985  . Smokeless tobacco: Never Used  . Alcohol Use: No  . Drug Use: No  . Sexual Activity: Not on file   Other Topics Concern  . Not on file   Social History Narrative   Married, no children.     Family History: Family History  Problem Relation Age of Onset  . Throat cancer Brother   . Coronary artery disease Father     deceased    Review of Systems: Positive: LUTS Negative:   A further 10 point review of systems was negative except what is listed in the HPI.                  Physical Exam: @VITALS2 @ General: No acute distress.  Awake. Head:  Normocephalic.  Atraumatic. ENT:  EOMI.  Mucous membranes moist Neck:  Supple.  No lymphadenopathy. CV:  S1 present. S2 present. Regular rate. Pulmonary: Equal effort bilaterally.  Clear to auscultation bilaterally. Abdomen: Soft.  Non  tender to palpation. Skin:  Normal turgor.  No visible rash. Extremity: No gross deformity of bilateral upper extremities.  No gross deformity of                             lower extremities. Neurologic: Alert. Appropriate mood.   Studies:  No results found for this basename: HGB, WBC, PLT,  in  the last 72 hours  No results found for this basename: NA, K, CL, CO2, BUN, CREATININE, CALCIUM, MAGNESIUM, GFRNONAA, GFRAA,  in the last 72 hours   No results found for this basename: PT, INR, APTT,  in the last 72 hours   No components found with this basename: ABG,     Assessment:  NMIBC, with recurrent positive cytologies  Plan: Cysto, bilateral retrograde pyelograms

## 2014-03-09 NOTE — Discharge Instructions (Signed)
Transurethral Resection of Bladder Tumor (TURBT)  °Definition:  °Transurethral Resection of the Bladder Tumor is a surgical procedure used to diagnose and remove tumors within the bladder. TURBT is the most common treatment for early stage bladder cancer.  ° °General instructions:  °Your recent bladder surgery requires very little post hospital care but some definite precautions.  °Despite the fact that no skin incisions were used, the area around the tumor removal site is raw and covered with scabs to promote healing and prevent bleeding. Certain precautions are needed to insure that the scabs are not disturbed over the next 2-4 weeks while the healing proceeds.  °Because the raw surface inside your bladder and the irritating effects of urine you may expect frequency of urination and/or urgency (a stronger desire to urinate) and perhaps even getting up at night more often. This will usually resolve or improve slowly over the healing period. You may see some blood in your urine over the first 6 weeks. Do not be alarmed, even if the urine was clear for a while. Get off your feet and drink lots of fluids until clearing occurs. If you start to pass clots or don't improve call us.  ° °Diet:  °You may return to your normal diet immediately. Because of the raw surface of your bladder, alcohol, spicy foods, foods high in acid and drinks with caffeine may cause irritation or frequency and should be used in moderation. To keep your urine flowing freely and avoid constipation, drink plenty of fluids during the day (8-10 glasses). Tip: Avoid cranberry juice because it is very acidic. °  °Activity:  °Your physical activity needs to be restricted somewhat.  We suggest that you reduce your activity under the circumstances until the bleeding has stopped. Heavy lifting (greater than 20 lbs.) and heavy exertion should be limited for 2-3 weeks. ° °Bowels:  °It is important to keep your bowels regular during the postoperative period.  Straining with bowel movements can cause bleeding. A bowel movement every other day is reasonable. Use a mild laxative if needed, such as milk of magnesia 2-3 tablespoons, or 2 Dulcolax tablets. Call if you continue to have problems. If you had been taking narcotics for pain, before, during or after your surgery, you may be constipated.  ° °Medication:  °You should resume your pre-surgery medications unless told not to. In addition you may be given an antibiotic to prevent or treat infection. Antibiotics are not always necessary. All medication should be taken as prescribed until the bottles are finished unless you are having an unusual reaction to one of the drugs.  °General Anesthetic, Adult  °A doctor specialized in giving anesthesia (anesthesiologist) or a nurse specialized in giving anesthesia (nurse anesthetist) gives medicine that makes you sleep while a procedure is performed (general anesthetic). Once the general anesthetic has been administered, you will be in a sleeplike state in which you feel no pain. After having a general anesthetic you may feel:  °Dizzy.  °Weak.  °Drowsy.  °Confused.  °These feelings are normal and can be expected to last for up to 24 hours after the procedure. ° ° °LET YOUR CAREGIVER KNOW ABOUT:  °Allergies you have.  °Medications you are taking, including herbs, eye drops, over the counter medications, dietary supplements, and creams.  °Previous problems you have had with anesthetics or numbing medicines.  °Use of cigarettes, alcohol, or illicit drugs.  °Possibility of pregnancy, if this applies.  °History of bleeding or blood disorders, including blood clots and clotting   disorders.  °Previous surgeries you have had and types of anesthetics you have received.  °Family medical history, especially anesthetic problems.  °Other health problems.  ° °AFTER THE PROCEDURE  °After surgery, you will be taken to the recovery area where a nurse will monitor your progress. You will be allowed  to go home when you are awake, stable, taking fluids well, and without serious pain or complications.  °For the first 24 hours following an anesthetic:  °Have a responsible person with you.  °Do not drive a car. If you are alone, do not take public transportation.  °Do not engage in strenuous activity. You may usually resume normal activities the next day, or as advised by your caregiver.  °Do not drink alcohol.  °Do not take medicine that has not been prescribed by your caregiver.  °Do not sign important papers or make important decisions as your judgement may be impaired.  °You may resume a normal diet as directed.  °Change bandages (dressings) as directed.  °Only take over-the-counter or prescription medicines for pain, discomfort, or fever as directed by your caregiver.  °If you have questions or problems that seem related to the anesthetic, call the hospital and ask for the anesthetist, anesthesiologist, or anesthesia department.  ° °SEEK IMMEDIATE MEDICAL CARE IF:  °You develop a rash.  °You have difficulty breathing.  °You have chest pain.  °You have allergic problems.  °You have uncontrolled nausea.  °You have uncontrolled vomiting.  °You develop any serious bleeding, especially from the incision site.  °Document Released: 10/13/2007 Document Revised: 03/18/2011 Document Reviewed: 11/06/2010  °Exit[employer]® Patient Information ©2012 [employer].  ° ° °

## 2014-03-10 DIAGNOSIS — Z0389 Encounter for observation for other suspected diseases and conditions ruled out: Secondary | ICD-10-CM | POA: Diagnosis not present

## 2014-03-10 NOTE — Progress Notes (Signed)
Discharge teaching completed with teach back. Instructed pt. on medications and reviewed DC instructions. Prescription called into [facility] pharmacy. Answered all questions. Spoke with Dr. Jeffie Pollock in regards to coumadin, per Dr.b Jeffie Pollock, instructed pt.  to stop coumadin and call Dr. Diona Fanti on Monday.

## 2014-03-10 NOTE — Progress Notes (Signed)
Pt. Left via wheelchair with friend, no respiratory distress noted. Pt. Discharged to home.

## 2014-03-10 NOTE — Progress Notes (Signed)
UR completed 

## 2014-03-10 NOTE — Discharge Summary (Signed)
Physician Discharge Summary  Patient ID: CAUY MELODY MRN: [id number] DOB/AGE: [id number] 78 y.o.  Admit date: 03/09/2014 Discharge date: 03/10/2014  Admission Diagnoses:  Malignant neoplasm of bladder, part unspecified  Discharge Diagnoses:  Principal Problem:   Malignant neoplasm of bladder, part unspecified   Past Medical History  Diagnosis Date  . Diverticulosis of colon (without mention of hemorrhage)   . Irritable bowel syndrome   . Personal history of malignant neoplasm of prostate     s/p  radioactive seed implants 2000  . Unspecified hereditary and idiopathic peripheral neuropathy   . Nonischemic cardiomyopathy cardiologist-  dr Aundra Dubin    EF 25%  per last echo 2012 and cardiologist note--  2007-  ef  40-45%;  2008- ef 35-40%;  2010- ef 30-35%;  2012- ef 25%  . Sinus bradycardia   . Hypertension   . Arthritis     hands  . Hyperlipidemia   . History of DVT of lower extremity     2002  ,   left leg  . History of adenomatous polyp of colon     2001  . History of basal cell carcinoma excision   . Anticoagulated on Coumadin   . Type 2 diabetes, diet controlled   . GERD (gastroesophageal reflux disease)   . LBBB (left bundle branch block)   . Wears glasses   . Wears hearing aid     BILATERAL  . Unsteady gait   . DDD (degenerative disc disease)     cervical and lumbar  . DJD (degenerative joint disease)     hips  . Lower urinary tract symptoms (LUTS)   . 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 Coronary artery disease     per cath 2002  non-obstructive cad  . Chronic systolic heart failure   . H/O fracture of nose     12-30-2013-  pt fell at home open nose bone fx    Surgeries: Procedure(s): CYSTOSCOPY WITH BILATERAL RETROGRADE PYELOGRAM BLADDER BX AND RENAL WASHINGS. on 03/09/2014   Consultants (if any):    Discharged Condition: Improved  Hospital Course: ORVILL COULTHARD is an 80 y.o. male who was admitted 03/09/2014 with a diagnosis of Malignant neoplasm of bladder, part unspecified and went to the operating room on 03/09/2014 and underwent the above named procedures.  He has done well and is voiding clear urine today.    He was given perioperative antibiotics:  Anti-infectives   Start     Dose/Rate Route Frequency Ordered Stop   03/09/14 1145  cephALEXin (KEFLEX) capsule 500 mg     500 mg Oral Every 12 hours 03/09/14 1134     03/09/14 0757  ceFAZolin (ANCEF) IVPB 1 g/50 mL premix  Status:  Discontinued     1 g 100 mL/hr over 30 Minutes Intravenous 30 min pre-op 03/09/14 0757 03/09/14 0758   03/09/14 0757  ceFAZolin (ANCEF) IVPB 2 g/50 mL premix     2 g 100 mL/hr over 30 Minutes Intravenous 30 min pre-op 03/09/14 0757 03/09/14 1042   03/09/14 0000  cephALEXin (KEFLEX) 500 MG capsule     500 mg Oral 4 times daily 03/09/14 1035      .  He was given sequential compression devices for DVT prophylaxis.  He benefited maximally from the hospital stay and there were no complications.    Recent vital signs:  Filed Vitals:   03/10/14  0840  BP: 118/81  Pulse: 60  Temp:   Resp:     Recent laboratory studies:  Lab Results  Component Value Date   HGB 11.8* 03/01/2014   HGB 11.7* 01/31/2014   HGB 11.4* 12/31/2013   Lab Results  Component Value Date   WBC 5.4 03/01/2014   PLT 201 03/01/2014   Lab Results  Component Value Date   INR 1.14 03/09/2014   Lab Results  Component Value Date   NA 141 03/01/2014   K 4.3 03/01/2014   CL 102 03/01/2014   CO2 27 03/01/2014   BUN 20 03/01/2014   CREATININE 1.07 03/01/2014   GLUCOSE 174* 03/01/2014    Discharge Medications:     Medication List    STOP taking these medications       warfarin 2 MG tablet  Commonly known as:  COUMADIN      TAKE these medications       carvedilol 12.5 MG tablet  Commonly known as:  COREG  Take 12.5 mg by mouth 2 (two) times daily with a meal.     cephALEXin 500 MG  capsule  Commonly known as:  KEFLEX  Take 1 capsule (500 mg total) by mouth 4 (four) times daily.     esomeprazole 40 MG capsule  Commonly known as:  NEXIUM  Take 40 mg by mouth as needed.     fish oil-omega-3 fatty acids 1000 MG capsule  Take 1 g by mouth daily.     furosemide 40 MG tablet  Commonly known as:  LASIX  Take 40 mg by mouth every morning.     HYDROcodone-acetaminophen 5-325 MG per tablet  Commonly known as:  NORCO/VICODIN  Take 1 tablet by mouth every 6 (six) hours as needed for moderate pain.     multivitamin with minerals Tabs tablet  Take 1 tablet by mouth daily.     nitroGLYCERIN 0.4 MG SL tablet  Commonly known as:  NITROSTAT  Place 1 tablet (0.4 mg total) under the tongue every 5 (five) minutes as needed for chest pain.     polyethylene glycol packet  Commonly known as:  MIRALAX / GLYCOLAX  Take 17 g by mouth 2 (two) times daily as needed for mild constipation.     pravastatin 40 MG tablet  Commonly known as:  PRAVACHOL  Take 40 mg by mouth every evening.     pregabalin 75 MG capsule  Commonly known as:  LYRICA  Take 75 mg by mouth daily.     ranitidine 150 MG tablet  Commonly known as:  ZANTAC  Take 150 mg by mouth 2 (two) times daily.     zolpidem 10 MG tablet  Commonly known as:  AMBIEN  Take 10 mg by mouth at bedtime as needed for sleep.        Diagnostic Studies: Dg Chest 2 View  03/01/2014   CLINICAL DATA:  Preop.  Hypertension.  EXAM: CHEST  2 VIEW  COMPARISON:  09/22/2012  FINDINGS: Lungs are hyperexpanded. Decrease markings in the peripheral upper lobes suggests emphysema. No lung mass, consolidation or edema. No pleural effusion or pneumothorax.  Cardiac silhouette is normal in size. No mediastinal or hilar masses or evidence of adenopathy.  Bony thorax is demineralized but intact.  IMPRESSION: No acute cardiopulmonary disease. Findings consistent with COPD. Stable appearance from the prior exam.   Electronically Signed   By: Lajean Manes M.D.   On: 03/01/2014 08:49    Disposition: 01-Home or Self  Care        Follow-up Information   Follow up with Jorja Loa, MD. (06/06/2014--if sooner appt needed Dr. Keturah Barre. will let you know when he calls w/ test results)    Specialty:  Urology   Contact information:   [address] [place] [address] [phone]        Signed: Malka So 03/10/2014, 11:25 AM

## 2014-03-12 ENCOUNTER — Encounter (HOSPITAL_BASED_OUTPATIENT_CLINIC_OR_DEPARTMENT_OTHER): Payer: Self-pay | Admitting: Urology

## 2014-03-12 ENCOUNTER — Other Ambulatory Visit: Payer: Self-pay | Admitting: *Deleted

## 2014-03-12 MED ORDER — FREESTYLE LITE DEVI: 1.0000 | Freq: Every day | Status: DC

## 2014-03-12 MED ORDER — GLUCOSE BLOOD VI STRP: 1.0000 | ORAL_STRIP | Freq: Every day | Status: DC

## 2014-03-14 ENCOUNTER — Ambulatory Visit (INDEPENDENT_AMBULATORY_CARE_PROVIDER_SITE_OTHER): Payer: Medicare Other | Admitting: *Deleted

## 2014-03-14 DIAGNOSIS — Z5181 Encounter for therapeutic drug level monitoring: Secondary | ICD-10-CM

## 2014-03-14 DIAGNOSIS — I82409 Acute embolism and thrombosis of unspecified deep veins of unspecified lower extremity: Secondary | ICD-10-CM

## 2014-03-14 LAB — POCT INR: INR: 1.3

## 2014-03-29 ENCOUNTER — Inpatient Hospital Stay (HOSPITAL_COMMUNITY)
Admission: EM | Admit: 2014-03-29 | Discharge: 2014-04-04 | DRG: 435 | Disposition: A | Discharge status: Nursing Home (Includes ICF) | Payer: Medicare Other | Attending: Internal Medicine | Admitting: Internal Medicine

## 2014-03-29 ENCOUNTER — Emergency Department (HOSPITAL_COMMUNITY): Payer: Medicare Other

## 2014-03-29 ENCOUNTER — Encounter (HOSPITAL_COMMUNITY): Payer: Self-pay | Admitting: Emergency Medicine

## 2014-03-29 DIAGNOSIS — D630 Anemia in neoplastic disease: Secondary | ICD-10-CM | POA: Diagnosis present

## 2014-03-29 DIAGNOSIS — R935 Abnormal findings on diagnostic imaging of other abdominal regions, including retroperitoneum: Secondary | ICD-10-CM

## 2014-03-29 DIAGNOSIS — I428 Other cardiomyopathies: Secondary | ICD-10-CM | POA: Diagnosis present

## 2014-03-29 DIAGNOSIS — E871 Hypo-osmolality and hyponatremia: Secondary | ICD-10-CM | POA: Diagnosis present

## 2014-03-29 DIAGNOSIS — C250 Malignant neoplasm of head of pancreas: Secondary | ICD-10-CM | POA: Diagnosis not present

## 2014-03-29 DIAGNOSIS — Z515 Encounter for palliative care: Secondary | ICD-10-CM

## 2014-03-29 DIAGNOSIS — Z8546 Personal history of malignant neoplasm of prostate: Secondary | ICD-10-CM

## 2014-03-29 DIAGNOSIS — E785 Hyperlipidemia, unspecified: Secondary | ICD-10-CM | POA: Diagnosis present

## 2014-03-29 DIAGNOSIS — I10 Essential (primary) hypertension: Secondary | ICD-10-CM | POA: Diagnosis present

## 2014-03-29 DIAGNOSIS — C787 Secondary malignant neoplasm of liver and intrahepatic bile duct: Secondary | ICD-10-CM | POA: Diagnosis present

## 2014-03-29 DIAGNOSIS — Z85828 Personal history of other malignant neoplasm of skin: Secondary | ICD-10-CM

## 2014-03-29 DIAGNOSIS — K831 Obstruction of bile duct: Secondary | ICD-10-CM | POA: Diagnosis present

## 2014-03-29 DIAGNOSIS — R319 Hematuria, unspecified: Secondary | ICD-10-CM

## 2014-03-29 DIAGNOSIS — G609 Hereditary and idiopathic neuropathy, unspecified: Secondary | ICD-10-CM

## 2014-03-29 DIAGNOSIS — K219 Gastro-esophageal reflux disease without esophagitis: Secondary | ICD-10-CM | POA: Diagnosis present

## 2014-03-29 DIAGNOSIS — E876 Hypokalemia: Secondary | ICD-10-CM | POA: Diagnosis present

## 2014-03-29 DIAGNOSIS — K8689 Other specified diseases of pancreas: Secondary | ICD-10-CM | POA: Diagnosis present

## 2014-03-29 DIAGNOSIS — I5022 Chronic systolic (congestive) heart failure: Secondary | ICD-10-CM | POA: Diagnosis present

## 2014-03-29 DIAGNOSIS — Z86718 Personal history of other venous thrombosis and embolism: Secondary | ICD-10-CM | POA: Diagnosis not present

## 2014-03-29 DIAGNOSIS — Z87891 Personal history of nicotine dependence: Secondary | ICD-10-CM | POA: Diagnosis not present

## 2014-03-29 DIAGNOSIS — E119 Type 2 diabetes mellitus without complications: Secondary | ICD-10-CM | POA: Diagnosis present

## 2014-03-29 DIAGNOSIS — C799 Secondary malignant neoplasm of unspecified site: Secondary | ICD-10-CM

## 2014-03-29 DIAGNOSIS — Z961 Presence of intraocular lens: Secondary | ICD-10-CM | POA: Diagnosis not present

## 2014-03-29 DIAGNOSIS — I2510 Atherosclerotic heart disease of native coronary artery without angina pectoris: Secondary | ICD-10-CM

## 2014-03-29 DIAGNOSIS — Z79899 Other long term (current) drug therapy: Secondary | ICD-10-CM | POA: Diagnosis not present

## 2014-03-29 DIAGNOSIS — Z96659 Presence of unspecified artificial knee joint: Secondary | ICD-10-CM | POA: Diagnosis not present

## 2014-03-29 DIAGNOSIS — R5381 Other malaise: Secondary | ICD-10-CM | POA: Diagnosis present

## 2014-03-29 DIAGNOSIS — N390 Urinary tract infection, site not specified: Secondary | ICD-10-CM | POA: Diagnosis present

## 2014-03-29 DIAGNOSIS — Z794 Long term (current) use of insulin: Secondary | ICD-10-CM | POA: Diagnosis not present

## 2014-03-29 DIAGNOSIS — Z9849 Cataract extraction status, unspecified eye: Secondary | ICD-10-CM | POA: Diagnosis not present

## 2014-03-29 DIAGNOSIS — C679 Malignant neoplasm of bladder, unspecified: Secondary | ICD-10-CM | POA: Diagnosis present

## 2014-03-29 DIAGNOSIS — R740 Nonspecific elevation of levels of transaminase and lactic acid dehydrogenase [LDH]: Secondary | ICD-10-CM

## 2014-03-29 DIAGNOSIS — R7401 Elevation of levels of liver transaminase levels: Secondary | ICD-10-CM | POA: Diagnosis present

## 2014-03-29 DIAGNOSIS — R300 Dysuria: Secondary | ICD-10-CM | POA: Diagnosis not present

## 2014-03-29 DIAGNOSIS — D638 Anemia in other chronic diseases classified elsewhere: Secondary | ICD-10-CM

## 2014-03-29 DIAGNOSIS — E440 Moderate protein-calorie malnutrition: Secondary | ICD-10-CM | POA: Diagnosis present

## 2014-03-29 DIAGNOSIS — R54 Age-related physical debility: Secondary | ICD-10-CM | POA: Diagnosis present

## 2014-03-29 DIAGNOSIS — R1013 Epigastric pain: Secondary | ICD-10-CM

## 2014-03-29 DIAGNOSIS — IMO0002 Reserved for concepts with insufficient information to code with codable children: Secondary | ICD-10-CM | POA: Diagnosis not present

## 2014-03-29 DIAGNOSIS — R1084 Generalized abdominal pain: Secondary | ICD-10-CM

## 2014-03-29 DIAGNOSIS — R5383 Other fatigue: Secondary | ICD-10-CM | POA: Diagnosis not present

## 2014-03-29 DIAGNOSIS — D649 Anemia, unspecified: Secondary | ICD-10-CM | POA: Diagnosis present

## 2014-03-29 DIAGNOSIS — R627 Adult failure to thrive: Secondary | ICD-10-CM | POA: Diagnosis present

## 2014-03-29 DIAGNOSIS — R531 Weakness: Secondary | ICD-10-CM | POA: Diagnosis present

## 2014-03-29 HISTORY — DX: Secondary malignant neoplasm of unspecified site: C79.9

## 2014-03-29 LAB — URINALYSIS, ROUTINE W REFLEX MICROSCOPIC
Glucose, UA: NEGATIVE mg/dL
Ketones, ur: NEGATIVE mg/dL
NITRITE: NEGATIVE
Protein, ur: NEGATIVE mg/dL
SPECIFIC GRAVITY, URINE: 1.016 (ref 1.005–1.030)
Urobilinogen, UA: 1 mg/dL (ref 0.0–1.0)
pH: 5 (ref 5.0–8.0)

## 2014-03-29 LAB — COMPREHENSIVE METABOLIC PANEL
ALT: 181 U/L — ABNORMAL HIGH (ref 0–53)
AST: 130 U/L — ABNORMAL HIGH (ref 0–37)
Albumin: 3.4 g/dL — ABNORMAL LOW (ref 3.5–5.2)
Alkaline Phosphatase: 480 U/L — ABNORMAL HIGH (ref 39–117)
Anion gap: 13 (ref 5–15)
BILIRUBIN TOTAL: 1 mg/dL (ref 0.3–1.2)
BUN: 20 mg/dL (ref 6–23)
CHLORIDE: 94 meq/L — AB (ref 96–112)
CO2: 25 mEq/L (ref 19–32)
CREATININE: 1.02 mg/dL (ref 0.50–1.35)
Calcium: 9.2 mg/dL (ref 8.4–10.5)
GFR calc Af Amer: 74 mL/min — ABNORMAL LOW (ref >90)
GFR calc non Af Amer: 63 mL/min — ABNORMAL LOW (ref >90)
Glucose, Bld: 160 mg/dL — ABNORMAL HIGH (ref 70–99)
Potassium: 3.9 mEq/L (ref 3.7–5.3)
Sodium: 132 mEq/L — ABNORMAL LOW (ref 137–147)
Total Protein: 7.1 g/dL (ref 6.0–8.3)

## 2014-03-29 LAB — LIPASE, BLOOD: Lipase: 10 U/L — ABNORMAL LOW (ref 11–59)

## 2014-03-29 LAB — CBC WITH DIFFERENTIAL/PLATELET
BASOS ABS: 0 10*3/uL (ref 0.0–0.1)
Basophils Relative: 0 % (ref 0–1)
Eosinophils Absolute: 0 10*3/uL (ref 0.0–0.7)
Eosinophils Relative: 0 % (ref 0–5)
HCT: 30.7 % — ABNORMAL LOW (ref 39.0–52.0)
Hemoglobin: 10.5 g/dL — ABNORMAL LOW (ref 13.0–17.0)
Lymphocytes Relative: 10 % — ABNORMAL LOW (ref 12–46)
Lymphs Abs: 0.5 10*3/uL — ABNORMAL LOW (ref 0.7–4.0)
MCH: 31.6 pg (ref 26.0–34.0)
MCHC: 34.2 g/dL (ref 30.0–36.0)
MCV: 92.5 fL (ref 78.0–100.0)
MONO ABS: 0.5 10*3/uL (ref 0.1–1.0)
Monocytes Relative: 9 % (ref 3–12)
NEUTROS PCT: 81 % — AB (ref 43–77)
Neutro Abs: 4.1 10*3/uL (ref 1.7–7.7)
PLATELETS: 194 10*3/uL (ref 150–400)
RBC: 3.32 MIL/uL — ABNORMAL LOW (ref 4.22–5.81)
RDW: 13.7 % (ref 11.5–15.5)
WBC: 5 10*3/uL (ref 4.0–10.5)

## 2014-03-29 LAB — URINE MICROSCOPIC-ADD ON

## 2014-03-29 LAB — PROTIME-INR
INR: 1.4 (ref 0.00–1.49)
Prothrombin Time: 17.2 seconds — ABNORMAL HIGH (ref 11.6–15.2)

## 2014-03-29 MED ORDER — SODIUM CHLORIDE 0.9 % IJ SOLN
3.0000 mL | Freq: Two times a day (BID) | INTRAMUSCULAR | Status: DC
  Administered 2014-03-30 – 2014-04-01 (×6): 3 mL via INTRAVENOUS

## 2014-03-29 MED ORDER — FUROSEMIDE 40 MG PO TABS: 40.0000 mg | ORAL_TABLET | Freq: Every morning | ORAL | Status: DC

## 2014-03-29 MED ORDER — OMEGA-3-ACID ETHYL ESTERS 1 G PO CAPS
1.0000 g | ORAL_CAPSULE | Freq: Every day | ORAL | Status: DC
  Administered 2014-03-30 – 2014-04-04 (×6): 1 g via ORAL
  Filled 2014-03-29 (×6): qty 1

## 2014-03-29 MED ORDER — IOHEXOL 300 MG/ML  SOLN
100.0000 mL | Freq: Once | INTRAMUSCULAR | Status: AC | PRN
  Administered 2014-03-29: 100 mL via INTRAVENOUS

## 2014-03-29 MED ORDER — DEXTROSE 5 % IV SOLN
1.0000 g | Freq: Every day | INTRAVENOUS | Status: DC
  Administered 2014-03-29 – 2014-03-31 (×3): 1 g via INTRAVENOUS
  Filled 2014-03-29 (×3): qty 10

## 2014-03-29 MED ORDER — FAMOTIDINE 20 MG PO TABS
20.0000 mg | ORAL_TABLET | Freq: Every day | ORAL | Status: DC
  Administered 2014-03-30 – 2014-04-04 (×6): 20 mg via ORAL
  Filled 2014-03-29 (×6): qty 1

## 2014-03-29 MED ORDER — SODIUM CHLORIDE 0.9 % IJ SOLN: 3.0000 mL | INTRAMUSCULAR | Status: DC | PRN

## 2014-03-29 MED ORDER — CARVEDILOL 12.5 MG PO TABS
12.5000 mg | ORAL_TABLET | Freq: Two times a day (BID) | ORAL | Status: DC
  Administered 2014-03-30 – 2014-04-04 (×11): 12.5 mg via ORAL
  Filled 2014-03-29 (×13): qty 1

## 2014-03-29 MED ORDER — POLYETHYLENE GLYCOL 3350 17 G PO PACK
17.0000 g | PACK | Freq: Two times a day (BID) | ORAL | Status: DC | PRN
  Administered 2014-03-31 (×2): 17 g via ORAL
  Filled 2014-03-29 (×2): qty 1

## 2014-03-29 MED ORDER — NITROGLYCERIN 0.4 MG SL SUBL: 0.4000 mg | SUBLINGUAL_TABLET | SUBLINGUAL | Status: DC | PRN

## 2014-03-29 MED ORDER — DOCUSATE SODIUM 100 MG PO CAPS
100.0000 mg | ORAL_CAPSULE | Freq: Two times a day (BID) | ORAL | Status: DC
  Administered 2014-03-30 – 2014-04-04 (×11): 100 mg via ORAL
  Filled 2014-03-29 (×13): qty 1

## 2014-03-29 MED ORDER — ONDANSETRON HCL 4 MG PO TABS: 4.0000 mg | ORAL_TABLET | Freq: Four times a day (QID) | ORAL | Status: DC | PRN

## 2014-03-29 MED ORDER — ACETAMINOPHEN 325 MG PO TABS: 650.0000 mg | ORAL_TABLET | Freq: Four times a day (QID) | ORAL | Status: DC | PRN

## 2014-03-29 MED ORDER — ZOLPIDEM TARTRATE 5 MG PO TABS
5.0000 mg | ORAL_TABLET | Freq: Every evening | ORAL | Status: DC | PRN
  Administered 2014-03-29 – 2014-04-03 (×6): 5 mg via ORAL
  Filled 2014-03-29 (×6): qty 1

## 2014-03-29 MED ORDER — SODIUM CHLORIDE 0.9 % IV SOLN: INTRAVENOUS | Status: DC

## 2014-03-29 MED ORDER — SODIUM CHLORIDE 0.9 % IV SOLN: 250.0000 mL | INTRAVENOUS | Status: DC | PRN

## 2014-03-29 MED ORDER — ONDANSETRON HCL 4 MG/2ML IJ SOLN: 4.0000 mg | Freq: Four times a day (QID) | INTRAMUSCULAR | Status: DC | PRN

## 2014-03-29 MED ORDER — SODIUM CHLORIDE 0.9 % IV SOLN
INTRAVENOUS | Status: DC
  Administered 2014-03-29: 21:00:00 via INTRAVENOUS

## 2014-03-29 MED ORDER — PREGABALIN 75 MG PO CAPS
75.0000 mg | ORAL_CAPSULE | Freq: Every day | ORAL | Status: DC
  Administered 2014-03-30 – 2014-04-04 (×6): 75 mg via ORAL
  Filled 2014-03-29 (×6): qty 1

## 2014-03-29 MED ORDER — ACETAMINOPHEN 650 MG RE SUPP: 650.0000 mg | Freq: Four times a day (QID) | RECTAL | Status: DC | PRN

## 2014-03-29 MED ORDER — HYDROCODONE-ACETAMINOPHEN 5-325 MG PO TABS
1.0000 | ORAL_TABLET | ORAL | Status: DC | PRN
  Administered 2014-03-29 – 2014-03-30 (×3): 1 via ORAL
  Administered 2014-03-31: 2 via ORAL
  Administered 2014-03-31: 1 via ORAL
  Administered 2014-04-01 (×2): 2 via ORAL
  Administered 2014-04-02: 1 via ORAL
  Administered 2014-04-02 – 2014-04-04 (×4): 2 via ORAL
  Filled 2014-03-29 (×2): qty 1
  Filled 2014-03-29 (×7): qty 2
  Filled 2014-03-29 (×3): qty 1

## 2014-03-29 MED ORDER — OMEGA-3 FATTY ACIDS 1000 MG PO CAPS: 1.0000 g | ORAL_CAPSULE | Freq: Every day | ORAL | Status: DC

## 2014-03-29 MED ORDER — ZOLPIDEM TARTRATE 10 MG PO TABS: 10.0000 mg | ORAL_TABLET | Freq: Every evening | ORAL | Status: DC | PRN

## 2014-03-29 MED ORDER — SODIUM CHLORIDE 0.9 % IV BOLUS (SEPSIS)
500.0000 mL | Freq: Once | INTRAVENOUS | Status: AC
  Administered 2014-03-29: 500 mL via INTRAVENOUS

## 2014-03-29 NOTE — H&P (Addendum)
[facility] History and Physical  [name] [name] TJQ:[id number] DOB: [birth year] DOA: 03/29/2014  Referring physician: Dr Eulis Foster.  PCP: Drema Pry, DO   Chief Complaint:   HPI: [name] [name] is a 80 y.o. male with PMH significant for cardiomyopathy Ef 25 %, ECHO 2012, bladder cancer S/P turbt with chemo instillation, S/P cystoscopy with bilateral retrograde pyelogram bladder bx and renal washing on 03-09-2014, who presents with malaise, decrease appetite, weakness and hematuria. Patient report hematuria for the last week,. He stopped coumadin 5 days ago. He report also dysuria for last 5 days. He relates chills. He relates chronic abdominal pain, for manu years.   A Ct abdomen and pelvis showed: 4.9 cm enhancing lesion adjacent to the pancreatic head/uncinate process, worrisome for primary pancreatic adenocarcinoma, progressed. Tumor likely involves the SMA/SMV and undersurface of the portal vein. 9 mm short axis node in the porta hepatitis. Suspected multifocal hepatic metastases, including a 3.2 cm lesion  in the right hepatic dome.    Review of Systems:  Negative, except as per HPI.   Past Medical History  Diagnosis Date  . Diverticulosis of colon (without mention of hemorrhage)   . Irritable bowel syndrome   . Personal history of malignant neoplasm of prostate     s/p  radioactive seed implants 2000  . Unspecified hereditary and idiopathic peripheral neuropathy   . Nonischemic cardiomyopathy cardiologist-  dr Aundra Dubin    EF 25%  per last echo 2012 and cardiologist note--  2007-  ef  40-45%;  2008- ef 35-40%;  2010- ef 30-35%;  2012- ef 25%  . Sinus bradycardia   . Hypertension   . Arthritis     hands  . Hyperlipidemia   . History of DVT of lower extremity     2002  ,   left leg  . History of adenomatous polyp of colon     2001  . History of basal cell carcinoma excision   . Anticoagulated on Coumadin   . Type 2 diabetes, diet controlled   . GERD (gastroesophageal reflux  disease)   . LBBB (left bundle branch block)   . Wears glasses   . Wears hearing aid     BILATERAL  . Unsteady gait   . DDD (degenerative disc disease)     cervical and lumbar  . DJD (degenerative joint disease)     hips  . Lower urinary tract symptoms (LUTS)   . 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 Coronary artery disease     per cath 2002  non-obstructive cad  . Chronic systolic heart failure   . H/O fracture of nose     12-30-2013-  pt fell at home open nose bone fx   Past Surgical History  Procedure Laterality Date  . Knee arthroscopy Left 1991  . Ercp with sphincterotomy and stone removal  05/ 1993  &  05-11-2006  . Total knee arthroplasty  10/05/2011    Procedure: TOTAL KNEE ARTHROPLASTY;  Surgeon: Gearlean Alf, MD;  Location: [facility];  Service: Orthopedics;  Laterality: Right;  . Transurethral resection of bladder tumor N/A 09/30/2012    Procedure: TRANSURETHRAL RESECTION OF BLADDER TUMOR (TURBT);  Surgeon: Franchot Gallo, MD;  Location: [facility];  Service: Urology;  Laterality: N/A;  WITH MITOMYCIN INSTILLATION   . Eus N/A 01/04/2014    Procedure: ESOPHAGEAL ENDOSCOPIC ULTRASOUND (EUS) RADIAL;  Surgeon: [name],  MD;  Location: [facility];  Service: Endoscopy;  Laterality: N/A;  . Radioactive prostate seed implants  01/ 2000  . Mohs surgery  03/ 2009    LEFT EAR BASAL CELL CARCINOMA  . Tonsillectomy  AS CHILD  . Cholecystectomy open  1985  . Appendectomy  child  . Inguinal hernia repair Bilateral 1998    recurrent RIH  w/ repair 06-21-2006  . Total knee arthroplasty Left 02-21-2007    AND 04-21-2007  CLOSED MANIPULATION  . Cataract extraction w/ intraocular lens implant Right 2004  . Lumbar spine surgery  X2  . Cardiac catheterization  06-14-2001  dr Darnell Level brodie    non-obstructive CAD/  LM 30%,  pLAD 50%,  mLAD 30%,  RCA  &  CFX  irregularites ,  global  hypokinesis,  ef 40%  . Cardiovascular stress test  02-07-2007   dr Darnell Level brodie    negative for ischemia,  low risk myoview perfusion study  . Transthoracic echocardiogram  01-28-2011    moderate LVH/  ef 25%/  diffuse hypokinesis/  mild LAE/  mild TR/  trivial AR & MR  . Cystoscopy w/ retrogrades Bilateral 03/09/2014    Procedure: CYSTOSCOPY WITH BILATERAL RETROGRADE PYELOGRAM BLADDER BX AND RENAL WASHINGS.;  Surgeon: Jorja Loa, MD;  Location: [facility];  Service: Urology;  Laterality: Bilateral;   Social History:  reports that he quit smoking about 28 years ago. His smoking use included Cigarettes. He has a 10 pack-year smoking history. He has never used smokeless tobacco. He reports that he does not drink alcohol or use illicit drugs. Lives by himself, widow, wife died 3 weeks ago.   No Known Allergies  Family History  Problem Relation Age of Onset  . Throat cancer Brother   . Coronary artery disease Father     deceased     Prior to Admission medications   Medication Sig Start Date End Date Taking? Authorizing Provider  carvedilol (COREG) 12.5 MG tablet Take 12.5 mg by mouth 2 (two) times daily with a meal.   Yes Historical Provider, MD  fish oil-omega-3 fatty acids 1000 MG capsule Take 1 g by mouth daily.   Yes Historical Provider, MD  furosemide (LASIX) 40 MG tablet Take 40 mg by mouth every morning.   Yes Historical Provider, MD  nitroGLYCERIN (NITROSTAT) 0.4 MG SL tablet Place 1 tablet (0.4 mg total) under the tongue every 5 (five) minutes as needed for chest pain. 11/22/13  Yes Thompson Grayer, MD  polyethylene glycol (MIRALAX / GLYCOLAX) packet Take 17 g by mouth 2 (two) times daily as needed for mild constipation.   Yes Historical Provider, MD  pravastatin (PRAVACHOL) 40 MG tablet Take 40 mg by mouth every evening.    Yes Historical Provider, MD  pregabalin (LYRICA) 75 MG capsule Take 75 mg by mouth daily.   Yes Historical Provider, MD  ranitidine (ZANTAC)  150 MG tablet Take 150 mg by mouth 2 (two) times daily.   Yes Historical Provider, MD  zolpidem (AMBIEN) 10 MG tablet Take 10 mg by mouth at bedtime as needed for sleep.   Yes Historical Provider, MD   Physical Exam: Filed Vitals:   03/29/14 1900 03/29/14 1928 03/29/14 2030 03/29/14 2143  BP: 112/55  117/51 118/60  Pulse: 34  66 82  Temp:  98.7 F (37.1 C)  98.8 F (37.1 C)  TempSrc:  Oral  Oral  Resp:    18  SpO2: 93%  94% 96%    Wt  Readings from Last 3 Encounters:  03/09/14 77.8 kg (171 lb 8.3 oz)  03/09/14 77.8 kg (171 lb 8.3 oz)  03/01/14 78.019 kg (172 lb)    General:  Appears calm and comfortable Eyes: PERRL, normal lids, irises & conjunctiva ENT: grossly normal hearing, lips & tongue Neck: no LAD, masses or thyromegaly Cardiovascular: RRR, no m/r/g. No LE edema. Telemetry: SR, no arrhythmias  Respiratory: CTA bilaterally, no w/r/r. Normal respiratory effort. Abdomen: soft, ntnd Skin: no rash or induration seen on limited exam Musculoskeletal: grossly normal tone BUE/BLE Psychiatric: grossly normal mood and affect, speech fluent and appropriate Neurologic: grossly non-focal.          Labs on Admission:  Basic Metabolic Panel:  Recent Labs Lab 03/29/14 1610  NA 132*  K 3.9  CL 94*  CO2 25  GLUCOSE 160*  BUN 20  CREATININE 1.02  CALCIUM 9.2   Liver Function Tests:  Recent Labs Lab 03/29/14 1610  AST 130*  ALT 181*  ALKPHOS 480*  BILITOT 1.0  PROT 7.1  ALBUMIN 3.4*    Recent Labs Lab 03/29/14 1610  LIPASE 10*   No results found for this basename: AMMONIA,  in the last 168 hours CBC:  Recent Labs Lab 03/29/14 1610  WBC 5.0  NEUTROABS 4.1  HGB 10.5*  HCT 30.7*  MCV 92.5  PLT 194   Cardiac Enzymes: No results found for this basename: CKTOTAL, CKMB, CKMBINDEX, TROPONINI,  in the last 168 hours  BNP (last 3 results) No results found for this basename: PROBNP,  in the last 8760 hours CBG: No results found for this basename: GLUCAP,   in the last 168 hours  Radiological Exams on Admission: Ct Abdomen Pelvis W Contrast  03/29/2014   CLINICAL DATA:  Diffuse abdominal pain, nausea, bladder cancer  EXAM: CT ABDOMEN AND PELVIS WITH CONTRAST  TECHNIQUE: Multidetector CT imaging of the abdomen and pelvis was performed using the standard protocol following bolus administration of intravenous contrast.  CONTRAST:  170mL OMNIPAQUE IOHEXOL 300 MG/ML  SOLN  COMPARISON:  12/06/2013  FINDINGS: Mild patchy opacities at the bilateral lung bases, likely atelectasis.  2.5 x 3.2 cm enhancing lesion at the right hepatic dome (series 2/ image 6), suspicious for metastasis. Additional scattered enhancing lesions throughout the liver, suspicious for additional small metastases. Stable small cysts.  Spleen and adrenal glands are within normal limits.  Fatty atrophy of the pancreas. Progressive 2.9 x 4.9 cm enhancing lesion adjacent to the pancreatic head/uncinate process, previously approximately 2.8 x 2.9 cm, worrisome for primary pancreatic adenocarcinoma.  Tumor abuts and likely involves the SMA and SMV (series 3/image 14). Tumor also likely abuts the undersurface of the portal vein.  Status post cholecystectomy. Mild central intrahepatic ductal dilatation. Dilated common duct, measuring 1.8 cm proximally, tapering abruptly at the level of the mass (series 2/ image 24). Kidneys are within normal limits. No hydronephrosis.  No evidence of bowel obstruction. Colonic diverticulosis, without associated inflammatory changes.  Atherosclerotic calcifications of the abdominal aorta and branch vessels.  No abdominopelvic ascites.  Small upper abdominal nodes, including a 9 mm short axis node in the porta hepatis (series 2/ image 21).  Brachytherapy seeds in the prostate.  Bladder is within normal limits.  Postsurgical changes in the lower anterior abdominal wall.  Extensive degenerative changes in the visualized thoracolumbar spine.  IMPRESSION: 4.9 cm enhancing lesion  adjacent to the pancreatic head/uncinate process, worrisome for primary pancreatic adenocarcinoma, progressed.  Tumor likely involves the SMA/SMV and undersurface of the  portal vein. 9 mm short axis node in the porta hepatitis.  Suspected multifocal hepatic metastases, including a 3.2 cm lesion in the right hepatic dome.  Additional ancillary findings as above.   Electronically Signed   By: Julian Hy M.D.   On: 03/29/2014 21:05    EKG: Independently reviewed. Sinus rhythm.   Assessment/Plan Active Problems:   DM   HYPERTENSION, BENIGN   SYSTOLIC HEART FAILURE, CHRONIC   Metastatic cancer   Hyponatremia  1-Failure to Thrive: Patient presents with decrease appetite, malaise, weakness:  Probably related to malignancy, metastatic cancer. Admit to med surgery. Work up for infectious process. Urine culture, chest x ray.  Will hold on IV fluids due to heart failure. Patient received IV fluids in the Ed. Will hold lasix.   2-Hematuria, UTI: Will check urine culture. Will start IV antibiotics, ceftriaxone. Follow urine culture.  Follow Hb trend.   3-Pancreatic Mass, Suspected multifocal hepatic metastases,; will need to contact Dr Ardis Hughs, with GI. Per patient Dr Edison Nasuti perform biopsy of pancreatic mass.   4-Transaminases: insetting of multiple hepatic lesions, and Tumor involving  the SMA/SMV and undersurface of the portal vein. 9 mm short axis node in the porta hepatitis. Follow trend.  Will hold statins.   5-Hyponatremia: Will order urine osmolality, serum osmolality and urine sodium.   6-Cardiomyopathy Ef 25 %; compensated, no dyspnea, no chest pain. Hold lasix due to concern for dehydration.   7-anticoagulation, coumadin: on coumadin for DVT LE 5 year ago. If Hb stable, hematuria improved and no procedures are planned will need to resume coumadin.      Code Status: wishes to be Full Code.  DVT Prophylaxis: scd.  Family Communication: Care discussed with patient.  Disposition  Plan: expect 3 to 4 days depending on clinical condition.   Time spent: 75 minutes.   Niel Hummer A [facility] Pager [phone]  **Disclaimer: This note may have been dictated with voice recognition software. Similar sounding words can inadvertently be transcribed and this note may contain transcription errors which may not have been corrected upon publication of note.**

## 2014-03-29 NOTE — Progress Notes (Signed)
Earlie Server (Dot) Cephus Shelling- patient's friend [phone]. Call if patient needs anything per patient request.

## 2014-03-29 NOTE — ED Notes (Signed)
Patient transported to CT 

## 2014-03-29 NOTE — ED Notes (Signed)
Pt c/o epigastric pain, states stomach has been hurting for years but this is more recent. Pt denies v/d but states he is naesuoes at times.

## 2014-03-29 NOTE — ED Provider Notes (Signed)
CSN: [id number]     Arrival date & time 03/29/14  1525 History   First MD Initiated Contact with Patient 03/29/14 1758     Chief Complaint  Patient presents with  . Abdominal Pain     (Consider location/radiation/quality/duration/timing/severity/associated sxs/prior Treatment) HPI   [name] [name] is a 80 y.o. male who is here for evaluation of malaise, decreased appetite, weakness, and hematuria. He saw his urologist today, who was concerned about his status, so sent him here for evaluation. The patient has noticed hematuria for several days. He stopped taking his Coumadin about 5 days ago because of the bleeding. His appetite has been poor for 2 weeks. His wife died 3 weeks ago after a prolonged illness. He is here with a friend, who has been helping him. He called a friend to take him to the urologist office today, because he was too weak to drive himself. He's been taking his usual medications, with the exception of his Coumadin. There's been no fever, chest pain, cough, or paresthesias. He reports constipation, for which he takes MiraLax, as needed. No other known modifying factors.   Past Medical History  Diagnosis Date  . Diverticulosis of colon (without mention of hemorrhage)   . Irritable bowel syndrome   . Personal history of malignant neoplasm of prostate     s/p  radioactive seed implants 2000  . Unspecified hereditary and idiopathic peripheral neuropathy   . Nonischemic cardiomyopathy cardiologist-  dr Aundra Dubin    EF 25%  per last echo 2012 and cardiologist note--  2007-  ef  40-45%;  2008- ef 35-40%;  2010- ef 30-35%;  2012- ef 25%  . Sinus bradycardia   . Hypertension   . Arthritis     hands  . Hyperlipidemia   . History of DVT of lower extremity     2002  ,   left leg  . History of adenomatous polyp of colon     2001  . History of basal cell carcinoma excision   . Anticoagulated on Coumadin   . Type 2 diabetes, diet controlled   . GERD (gastroesophageal reflux  disease)   . LBBB (left bundle branch block)   . Wears glasses   . Wears hearing aid     BILATERAL  . Unsteady gait   . DDD (degenerative disc disease)     cervical and lumbar  . DJD (degenerative joint disease)     hips  . Lower urinary tract symptoms (LUTS)   . 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 Coronary artery disease     per cath 2002  non-obstructive cad  . Chronic systolic heart failure   . H/O fracture of nose     12-30-2013-  pt fell at home open nose bone fx   Past Surgical History  Procedure Laterality Date  . Knee arthroscopy Left 1991  . Ercp with sphincterotomy and stone removal  05/ 1993  &  05-11-2006  . Total knee arthroplasty  10/05/2011    Procedure: TOTAL KNEE ARTHROPLASTY;  Surgeon: Gearlean Alf, MD;  Location: [facility];  Service: Orthopedics;  Laterality: Right;  . Transurethral resection of bladder tumor N/A 09/30/2012    Procedure: TRANSURETHRAL RESECTION OF BLADDER TUMOR (TURBT);  Surgeon: Franchot Gallo, MD;  Location: [facility];  Service: Urology;  Laterality: N/A;  WITH MITOMYCIN INSTILLATION   . Eus N/A 01/04/2014  Procedure: ESOPHAGEAL ENDOSCOPIC ULTRASOUND (EUS) RADIAL;  Surgeon: [name], MD;  Location: [facility];  Service: Endoscopy;  Laterality: N/A;  . Radioactive prostate seed implants  01/ 2000  . Mohs surgery  03/ 2009    LEFT EAR BASAL CELL CARCINOMA  . Tonsillectomy  AS CHILD  . Cholecystectomy open  1985  . Appendectomy  child  . Inguinal hernia repair Bilateral 1998    recurrent RIH  w/ repair 06-21-2006  . Total knee arthroplasty Left 02-21-2007    AND 04-21-2007  CLOSED MANIPULATION  . Cataract extraction w/ intraocular lens implant Right 2004  . Lumbar spine surgery  X2  . Cardiac catheterization  06-14-2001  dr Darnell Level brodie    non-obstructive CAD/  LM 30%,  pLAD 50%,  mLAD 30%,  RCA  &  CFX  irregularites ,  global  hypokinesis,  ef 40%  . Cardiovascular stress test  02-07-2007   dr Darnell Level brodie    negative for ischemia,  low risk myoview perfusion study  . Transthoracic echocardiogram  01-28-2011    moderate LVH/  ef 25%/  diffuse hypokinesis/  mild LAE/  mild TR/  trivial AR & MR  . Cystoscopy w/ retrogrades Bilateral 03/09/2014    Procedure: CYSTOSCOPY WITH BILATERAL RETROGRADE PYELOGRAM BLADDER BX AND RENAL WASHINGS.;  Surgeon: Jorja Loa, MD;  Location: [facility];  Service: Urology;  Laterality: Bilateral;   Family History  Problem Relation Age of Onset  . Throat cancer Brother   . Coronary artery disease Father     deceased   History  Substance Use Topics  . Smoking status: Former Smoker -- 1.00 packs/day for 10 years    Types: Cigarettes    Quit date: 07/20/1985  . Smokeless tobacco: Never Used  . Alcohol Use: No    Review of Systems  All other systems reviewed and are negative.     Allergies  Review of patient's allergies indicates no known allergies.  Home Medications   Prior to Admission medications   Medication Sig Start Date End Date Taking? Authorizing Provider  carvedilol (COREG) 12.5 MG tablet Take 12.5 mg by mouth 2 (two) times daily with a meal.   Yes Historical Provider, MD  fish oil-omega-3 fatty acids 1000 MG capsule Take 1 g by mouth daily.   Yes Historical Provider, MD  furosemide (LASIX) 40 MG tablet Take 40 mg by mouth every morning.   Yes Historical Provider, MD  nitroGLYCERIN (NITROSTAT) 0.4 MG SL tablet Place 1 tablet (0.4 mg total) under the tongue every 5 (five) minutes as needed for chest pain. 11/22/13  Yes Thompson Grayer, MD  polyethylene glycol (MIRALAX / GLYCOLAX) packet Take 17 g by mouth 2 (two) times daily as needed for mild constipation.   Yes Historical Provider, MD  pravastatin (PRAVACHOL) 40 MG tablet Take 40 mg by mouth every evening.    Yes Historical Provider, MD  pregabalin (LYRICA) 75 MG capsule Take 75 mg by mouth  daily.   Yes Historical Provider, MD  ranitidine (ZANTAC) 150 MG tablet Take 150 mg by mouth 2 (two) times daily.   Yes Historical Provider, MD  zolpidem (AMBIEN) 10 MG tablet Take 10 mg by mouth at bedtime as needed for sleep.   Yes Historical Provider, MD   BP 117/51  Pulse 66  Temp(Src) 98.7 F (37.1 C) (Oral)  Resp 18  SpO2 94% Physical Exam  Nursing note and vitals reviewed. Constitutional: He is oriented to person, place, and time. He  appears well-developed.  Elderly, frail  HENT:  Head: Normocephalic and atraumatic.  Right Ear: External ear normal.  Left Ear: External ear normal.  Eyes: Conjunctivae and EOM are normal. Pupils are equal, round, and reactive to light.  Neck: Normal range of motion and phonation normal. Neck supple.  Cardiovascular: Normal rate, regular rhythm, normal heart sounds and intact distal pulses.   Pulmonary/Chest: Effort normal and breath sounds normal. He exhibits no bony tenderness.  Abdominal: Soft. He exhibits no mass. There is tenderness (Epigastric, mild). There is no rebound and no guarding.  Musculoskeletal: Normal range of motion.  Neurological: He is alert and oriented to person, place, and time. No cranial nerve deficit or sensory deficit. He exhibits normal muscle tone. Coordination normal.  Skin: Skin is warm, dry and intact.  Psychiatric: He has a normal mood and affect. His behavior is normal. Judgment and thought content normal.    ED Course  Procedures (including critical care time) Medications  0.9 %  sodium chloride infusion ( Intravenous New Bag/Given 03/29/14 2103)  sodium chloride 0.9 % bolus 500 mL (0 mLs Intravenous Stopped 03/29/14 2035)  iohexol (OMNIPAQUE) 300 MG/ML solution 100 mL (100 mLs Intravenous Contrast Given 03/29/14 2035)    Patient Vitals for the past 24 hrs:  BP Temp Temp src Pulse Resp SpO2  03/29/14 2030 117/51 mmHg - - 66 - 94 %  03/29/14 1928 - 98.7 F (37.1 C) Oral - - -  03/29/14 1900 112/55 mmHg - -  34 - 93 %  03/29/14 1716 114/57 mmHg 98.9 F (37.2 C) Oral 63 18 96 %  03/29/14 1533 100/46 mmHg 99.8 F (37.7 C) Oral 55 20 96 %    9:22 PM Reevaluation with update and discussion. After initial assessment and treatment, an updated evaluation reveals he is feeling better at this time. Latorria Zeoli L    9:30 PM-Consult complete with Dr. Tyrell Antonio. Patient case explained and discussed. She agrees to admit patient for further evaluation and treatment. Call ended at 2134   Labs Review Labs Reviewed  CBC WITH DIFFERENTIAL - Abnormal; Notable for the following:    RBC 3.32 (*)    Hemoglobin 10.5 (*)    HCT 30.7 (*)    Neutrophils Relative % 81 (*)    Lymphocytes Relative 10 (*)    Lymphs Abs 0.5 (*)    All other components within normal limits  COMPREHENSIVE METABOLIC PANEL - Abnormal; Notable for the following:    Sodium 132 (*)    Chloride 94 (*)    Glucose, Bld 160 (*)    Albumin 3.4 (*)    AST 130 (*)    ALT 181 (*)    Alkaline Phosphatase 480 (*)    GFR calc non Af Amer 63 (*)    GFR calc Af Amer 74 (*)    All other components within normal limits  LIPASE, BLOOD - Abnormal; Notable for the following:    Lipase 10 (*)    All other components within normal limits  URINALYSIS, ROUTINE W REFLEX MICROSCOPIC - Abnormal; Notable for the following:    Color, Urine AMBER (*)    Hgb urine dipstick SMALL (*)    Bilirubin Urine SMALL (*)    Leukocytes, UA SMALL (*)    All other components within normal limits  URINE MICROSCOPIC-ADD ON - Abnormal; Notable for the following:    Bacteria, UA FEW (*)    All other components within normal limits  PROTIME-INR - Abnormal; Notable for the following:  Prothrombin Time 17.2 (*)    All other components within normal limits    Imaging Review Ct Abdomen Pelvis W Contrast  03/29/2014   CLINICAL DATA:  Diffuse abdominal pain, nausea, bladder cancer  EXAM: CT ABDOMEN AND PELVIS WITH CONTRAST  TECHNIQUE: Multidetector CT imaging of the  abdomen and pelvis was performed using the standard protocol following bolus administration of intravenous contrast.  CONTRAST:  139mL OMNIPAQUE IOHEXOL 300 MG/ML  SOLN  COMPARISON:  12/06/2013  FINDINGS: Mild patchy opacities at the bilateral lung bases, likely atelectasis.  2.5 x 3.2 cm enhancing lesion at the right hepatic dome (series 2/ image 6), suspicious for metastasis. Additional scattered enhancing lesions throughout the liver, suspicious for additional small metastases. Stable small cysts.  Spleen and adrenal glands are within normal limits.  Fatty atrophy of the pancreas. Progressive 2.9 x 4.9 cm enhancing lesion adjacent to the pancreatic head/uncinate process, previously approximately 2.8 x 2.9 cm, worrisome for primary pancreatic adenocarcinoma.  Tumor abuts and likely involves the SMA and SMV (series 3/image 14). Tumor also likely abuts the undersurface of the portal vein.  Status post cholecystectomy. Mild central intrahepatic ductal dilatation. Dilated common duct, measuring 1.8 cm proximally, tapering abruptly at the level of the mass (series 2/ image 24). Kidneys are within normal limits. No hydronephrosis.  No evidence of bowel obstruction. Colonic diverticulosis, without associated inflammatory changes.  Atherosclerotic calcifications of the abdominal aorta and branch vessels.  No abdominopelvic ascites.  Small upper abdominal nodes, including a 9 mm short axis node in the porta hepatis (series 2/ image 21).  Brachytherapy seeds in the prostate.  Bladder is within normal limits.  Postsurgical changes in the lower anterior abdominal wall.  Extensive degenerative changes in the visualized thoracolumbar spine.  IMPRESSION: 4.9 cm enhancing lesion adjacent to the pancreatic head/uncinate process, worrisome for primary pancreatic adenocarcinoma, progressed.  Tumor likely involves the SMA/SMV and undersurface of the portal vein. 9 mm short axis node in the porta hepatitis.  Suspected multifocal  hepatic metastases, including a 3.2 cm lesion in the right hepatic dome.  Additional ancillary findings as above.   Electronically Signed   By: Julian Hy M.D.   On: 03/29/2014 21:05     EKG Interpretation   Date/Time:  Thursday March 29 2014 15:38:57 EDT Ventricular Rate:  57 PR Interval:  197 QRS Duration: 143 QT Interval:  480 QTC Calculation: 467 R Axis:   -57 Text Interpretation:  Sinus rhythm Nonspecific IVCD with LAD Consider  anterior infarct Baseline wander in lead(s) II III aVR aVF V2 V6 since  last tracing no significant change Confirmed by Eulis Foster  MD, Vira Agar ([id number])  on 03/29/2014 6:00:50 PM      MDM   Final diagnoses:  Generalized abdominal pain  Hematuria  Metastatic cancer    Malaise, and anorexia with apparent metastatic cancer. He is secondary weakness. Will need to be admitted for further management, and treatment.   Nursing Notes Reviewed/ Care Coordinated Applicable Imaging Reviewed Interpretation of Laboratory Data incorporated into ED treatment   Plan: Admit    Richarda Blade, MD 03/29/14 2137

## 2014-03-30 ENCOUNTER — Inpatient Hospital Stay (HOSPITAL_COMMUNITY): Payer: Medicare Other

## 2014-03-30 DIAGNOSIS — R627 Adult failure to thrive: Secondary | ICD-10-CM

## 2014-03-30 DIAGNOSIS — E440 Moderate protein-calorie malnutrition: Secondary | ICD-10-CM

## 2014-03-30 DIAGNOSIS — K8689 Other specified diseases of pancreas: Secondary | ICD-10-CM | POA: Diagnosis present

## 2014-03-30 DIAGNOSIS — R5383 Other fatigue: Secondary | ICD-10-CM

## 2014-03-30 DIAGNOSIS — R740 Nonspecific elevation of levels of transaminase and lactic acid dehydrogenase [LDH]: Secondary | ICD-10-CM

## 2014-03-30 DIAGNOSIS — R54 Age-related physical debility: Secondary | ICD-10-CM | POA: Diagnosis present

## 2014-03-30 DIAGNOSIS — Z515 Encounter for palliative care: Secondary | ICD-10-CM

## 2014-03-30 DIAGNOSIS — R5381 Other malaise: Secondary | ICD-10-CM

## 2014-03-30 DIAGNOSIS — R4181 Age-related cognitive decline: Secondary | ICD-10-CM

## 2014-03-30 DIAGNOSIS — R7401 Elevation of levels of liver transaminase levels: Secondary | ICD-10-CM | POA: Diagnosis present

## 2014-03-30 DIAGNOSIS — D649 Anemia, unspecified: Secondary | ICD-10-CM

## 2014-03-30 DIAGNOSIS — C801 Malignant (primary) neoplasm, unspecified: Secondary | ICD-10-CM

## 2014-03-30 DIAGNOSIS — E119 Type 2 diabetes mellitus without complications: Secondary | ICD-10-CM

## 2014-03-30 DIAGNOSIS — D638 Anemia in other chronic diseases classified elsewhere: Secondary | ICD-10-CM

## 2014-03-30 DIAGNOSIS — R7402 Elevation of levels of lactic acid dehydrogenase (LDH): Secondary | ICD-10-CM

## 2014-03-30 DIAGNOSIS — N390 Urinary tract infection, site not specified: Secondary | ICD-10-CM

## 2014-03-30 DIAGNOSIS — C679 Malignant neoplasm of bladder, unspecified: Secondary | ICD-10-CM

## 2014-03-30 HISTORY — DX: Moderate protein-calorie malnutrition: E44.0

## 2014-03-30 HISTORY — DX: Adult failure to thrive: R62.7

## 2014-03-30 HISTORY — DX: Age-related physical debility: R54

## 2014-03-30 HISTORY — DX: Elevation of levels of liver transaminase levels: R74.01

## 2014-03-30 HISTORY — DX: Anemia, unspecified: D64.9

## 2014-03-30 LAB — COMPREHENSIVE METABOLIC PANEL
ALBUMIN: 2.8 g/dL — AB (ref 3.5–5.2)
ALT: 118 U/L — AB (ref 0–53)
AST: 63 U/L — AB (ref 0–37)
Alkaline Phosphatase: 344 U/L — ABNORMAL HIGH (ref 39–117)
Anion gap: 11 (ref 5–15)
BILIRUBIN TOTAL: 0.7 mg/dL (ref 0.3–1.2)
BUN: 17 mg/dL (ref 6–23)
CHLORIDE: 100 meq/L (ref 96–112)
CO2: 26 mEq/L (ref 19–32)
CREATININE: 0.91 mg/dL (ref 0.50–1.35)
Calcium: 8.6 mg/dL (ref 8.4–10.5)
GFR calc Af Amer: 85 mL/min — ABNORMAL LOW (ref >90)
GFR calc non Af Amer: 73 mL/min — ABNORMAL LOW (ref >90)
Glucose, Bld: 130 mg/dL — ABNORMAL HIGH (ref 70–99)
Potassium: 3.5 mEq/L — ABNORMAL LOW (ref 3.7–5.3)
SODIUM: 137 meq/L (ref 137–147)
Total Protein: 5.7 g/dL — ABNORMAL LOW (ref 6.0–8.3)

## 2014-03-30 LAB — CBC
HEMATOCRIT: 27.9 % — AB (ref 39.0–52.0)
Hemoglobin: 9.4 g/dL — ABNORMAL LOW (ref 13.0–17.0)
MCH: 31.3 pg (ref 26.0–34.0)
MCHC: 33.7 g/dL (ref 30.0–36.0)
MCV: 93 fL (ref 78.0–100.0)
Platelets: 170 10*3/uL (ref 150–400)
RBC: 3 MIL/uL — ABNORMAL LOW (ref 4.22–5.81)
RDW: 13.9 % (ref 11.5–15.5)
WBC: 3.6 10*3/uL — AB (ref 4.0–10.5)

## 2014-03-30 LAB — OSMOLALITY, URINE: OSMOLALITY UR: 461 mosm/kg (ref 390–1090)

## 2014-03-30 LAB — OSMOLALITY: Osmolality: 284 mOsm/kg (ref 275–300)

## 2014-03-30 LAB — SODIUM, URINE, RANDOM: Sodium, Ur: 55 mEq/L

## 2014-03-30 MED ORDER — GLUCERNA SHAKE PO LIQD
237.0000 mL | ORAL | Status: DC
  Administered 2014-03-30 – 2014-04-03 (×3): 237 mL via ORAL
  Filled 2014-03-30 (×6): qty 237

## 2014-03-30 MED ORDER — POTASSIUM CHLORIDE CRYS ER 20 MEQ PO TBCR
40.0000 meq | EXTENDED_RELEASE_TABLET | Freq: Once | ORAL | Status: AC
  Administered 2014-03-30: 40 meq via ORAL
  Filled 2014-03-30: qty 2

## 2014-03-30 MED ORDER — GADOBENATE DIMEGLUMINE 529 MG/ML IV SOLN: 18.0000 mL | Freq: Once | INTRAVENOUS | Status: AC | PRN

## 2014-03-30 NOTE — Consult Note (Signed)
[name] [name]      DOB: [birth year]      HWE:[id number]     Consult Note from the Palliative Medicine Team at [place] Requested by: Dr Rama     PCP: Drema Pry, DO Reason for Consultation: Clarification of [place] and options     Phone Number:[phone]  Assessment of patients Current state: Continued physical and functional decline noted over the past 3 weeks.  [name]'s wife died less than one month ago. A CT scan done on admission showed a 4.9 cm enhancing lesion adjacent to the pancreatic head/uncinate process worrisome for a primary pancreatic adenocarcinoma and suspected multifocal hepatic metastasis. [name] is faced with advanced directive decisions and anticipatory care needs; very little primary support   Consult is for review of medical treatment options, clarification of goals of care and end of life issues, disposition and options, and symptom recommendation.  This NP Wadie Lessen reviewed medical records, received report from team, assessed the [name] and then meet at the [name]'s bedside along with his support persons from church [employer]) and family friend Lyn from [place] area  to discuss diagnosis prognosis, [place], EOL wishes disposition and options.  A detailed discussion was had today regarding advanced directives.  Concepts specific to code status, artifical feeding and hydration, continued IV antibiotics and rehospitalization was had.  The difference between a aggressive medical intervention path  and a palliative comfort care path for this [name] at this time was had.  Values and goals of care important to [name]  were attempted to be elicited.  Concept of Hospice and Palliative Care were discussed  Natural trajectory and expectations at EOL were discussed.  Questions and concerns addressed.  Hard Choices booklet left for review. Family encouraged to call with questions or concerns.  PMT will continue to support holistically.   Goals  of Care: 1.  Code Status: Full code   2. Scope of Treatment: [name] is open to all available and offered medical interventions to prolong life, "I want to live".  Requesting biopsy for definitive "answers"  3. Disposition:  Hoping to secure assisted leaving housing at [facility] for discharge. Biopsy results and  offered treatment options will impact discharge plan  4. Symptom Management:   1.  Weakness: medical management of chronic disease and continued work with PT/OT  5. Psychosocial:  Emotional support offered to [name] and his support persons at bedside.  Encouraged to continue discussion regarding the concept of mortality and the limitations of medicine   6. Spiritual: Chaplain services consulted    [name] Documents Completed or Given: Document Given Completed  Advanced Directives Pkt    MOST yes   DNR    Gone from My [facility]ight    Hard Choices yes     Brief HPI: [name] [name] is an 80 y.o. male with a PMH of DVT, chronic systolic heart failure/cardiomyopathy, last echocardiogram done in 2012 with EF of 25%, bladder cancer status post TURBT with chemotherapy instillation, status post cystoscopy with bilateral retrograde pyelogram and bladder biopsy/renal washing on 03/09/14, pancreatic mass incidentally discovered on imaging (declined further evaluation with endoscopic ultrasound/biopsy given multiple comorbidities and frailty) who was admitted 03/29/14 with chief complaint of malaise, decreased appetite, generalized weakness and hematuria. A CT scan done on admission showed a 4.9 cm enhancing lesion adjacent to the pancreatic head/uncinate process worrisome for a primary pancreatic adenocarcinoma and suspected multifocal hepatic metastasis. Awaiting biopsy for treatment options  ROS: weakness,  poor appetite, intermittent "stomach pain"   PMH:  Past Medical History  Diagnosis Date  . Diverticulosis of colon (without mention of hemorrhage)   . Irritable bowel syndrome   .  Personal history of malignant neoplasm of prostate     s/p  radioactive seed implants 2000  . Unspecified hereditary and idiopathic peripheral neuropathy   . Nonischemic cardiomyopathy cardiologist-  dr Aundra Dubin    EF 25%  per last echo 2012 and cardiologist note--  2007-  ef  40-45%;  2008- ef 35-40%;  2010- ef 30-35%;  2012- ef 25%  . Sinus bradycardia   . Hypertension   . Arthritis     hands  . Hyperlipidemia   . History of DVT of lower extremity     2002  ,   left leg  . History of adenomatous polyp of colon     2001  . History of basal cell carcinoma excision   . Anticoagulated on Coumadin   . Type 2 diabetes, diet controlled   . GERD (gastroesophageal reflux disease)   . LBBB (left bundle branch block)   . Wears glasses   . Wears hearing aid     BILATERAL  . Unsteady gait   . DDD (degenerative disc disease)     cervical and lumbar  . DJD (degenerative joint disease)     hips  . Lower urinary tract symptoms (LUTS)   . 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 Coronary artery disease     per cath 2002  non-obstructive cad  . Chronic systolic heart failure   . H/O fracture of nose     12-30-2013-  pt fell at home open nose bone fx     PSH: Past Surgical History  Procedure Laterality Date  . Knee arthroscopy Left 1991  . Ercp with sphincterotomy and stone removal  05/ 1993  &  05-11-2006  . Total knee arthroplasty  10/05/2011    Procedure: TOTAL KNEE ARTHROPLASTY;  Surgeon: Gearlean Alf, MD;  [facility]: [facility];  Service: Orthopedics;  Laterality: Right;  . Transurethral resection of bladder tumor N/A 09/30/2012    Procedure: TRANSURETHRAL RESECTION OF BLADDER TUMOR (TURBT);  Surgeon: Franchot Gallo, MD;  Location: [facility];  Service: Urology;  Laterality: N/A;  WITH MITOMYCIN INSTILLATION   . Eus N/A 01/04/2014    Procedure: ESOPHAGEAL ENDOSCOPIC ULTRASOUND (EUS) RADIAL;  Surgeon:  [name], MD;  [facility];  Service: Endoscopy;  Laterality: N/A;  . Radioactive prostate seed implants  01/ 2000  . Mohs surgery  03/ 2009    LEFT EAR BASAL CELL CARCINOMA  . Tonsillectomy  AS CHILD  . Cholecystectomy open  1985  . Appendectomy  child  . Inguinal hernia repair Bilateral 1998    recurrent RIH  w/ repair 06-21-2006  . Total knee arthroplasty Left 02-21-2007    AND 04-21-2007  CLOSED MANIPULATION  . Cataract extraction w/ intraocular lens implant Right 2004  . Lumbar spine surgery  X2  . Cardiac catheterization  06-14-2001  dr Darnell Level brodie    non-obstructive CAD/  LM 30%,  pLAD 50%,  mLAD 30%,  RCA  &  CFX  irregularites ,  global hypokinesis,  ef 40%  . Cardiovascular stress test  02-07-2007   dr Darnell Level brodie    negative for ischemia,  low risk myoview perfusion study  . Transthoracic echocardiogram  01-28-2011  moderate LVH/  ef 25%/  diffuse hypokinesis/  mild LAE/  mild TR/  trivial AR & MR  . Cystoscopy w/ retrogrades Bilateral 03/09/2014    Procedure: CYSTOSCOPY WITH BILATERAL RETROGRADE PYELOGRAM BLADDER BX AND RENAL WASHINGS.;  Surgeon: Jorja Loa, MD;  [facility];  Service: Urology;  Laterality: Bilateral;   I have reviewed the [place] and SH and  If appropriate update it with new information. No Known Allergies Scheduled Meds: . carvedilol  12.5 mg Oral BID WC  . cefTRIAXone (ROCEPHIN)  IV  1 g Intravenous QHS  . docusate sodium  100 mg Oral BID  . famotidine  20 mg Oral Daily  . feeding supplement (GLUCERNA SHAKE)  237 mL Oral Q24H  . omega-3 acid ethyl esters  1 g Oral Daily  . pregabalin  75 mg Oral Daily  . sodium chloride  3 mL Intravenous Q12H   Continuous Infusions:  PRN Meds:.sodium chloride, acetaminophen, acetaminophen, HYDROcodone-acetaminophen, nitroGLYCERIN, ondansetron (ZOFRAN) IV, ondansetron, polyethylene glycol, sodium chloride, zolpidem    BP 139/57  Pulse 59  Temp(Src) 98.3 F (36.8  C) (Oral)  Resp 16  Ht 5\' 10"  (1.778 m)  Wt 77.7 kg (171 lb 4.8 oz)  BMI 24.58 kg/m2  SpO2 96%   PPS:40 %   Intake/Output Summary (Last 24 hours) at 03/30/14 1636 Last data filed at 03/30/14 1427  Gross per 24 hour  Intake    493 ml  Output   1100 ml  Net   -607 ml    Physical Exam:  General: chronically ill appearing,   NAD HEENT:  Moist buccal membranes, no exudate Chest: decreased in bases CTA CVS: RRR Abdomen: soft NT + BS Ext: without edema Neuro: alert and oriented X3  Labs: CBC    Component Value Date/Time   WBC 3.6* 03/30/2014 0424   RBC 3.00* 03/30/2014 0424   HGB 9.4* 03/30/2014 0424   HCT 27.9* 03/30/2014 0424   PLT 170 03/30/2014 0424   MCV 93.0 03/30/2014 0424   MCH 31.3 03/30/2014 0424   MCHC 33.7 03/30/2014 0424   RDW 13.9 03/30/2014 0424   LYMPHSABS 0.5* 03/29/2014 1610   MONOABS 0.5 03/29/2014 1610   EOSABS 0.0 03/29/2014 1610   BASOSABS 0.0 03/29/2014 1610    BMET    Component Value Date/Time   NA 137 03/30/2014 0424   K 3.5* 03/30/2014 0424   CL 100 03/30/2014 0424   CO2 26 03/30/2014 0424   GLUCOSE 130* 03/30/2014 0424   BUN 17 03/30/2014 0424   CREATININE 0.91 03/30/2014 0424   CALCIUM 8.6 03/30/2014 0424   GFRNONAA 73* 03/30/2014 0424   GFRAA 85* 03/30/2014 0424    CMP     Component Value Date/Time   NA 137 03/30/2014 0424   K 3.5* 03/30/2014 0424   CL 100 03/30/2014 0424   CO2 26 03/30/2014 0424   GLUCOSE 130* 03/30/2014 0424   BUN 17 03/30/2014 0424   CREATININE 0.91 03/30/2014 0424   CALCIUM 8.6 03/30/2014 0424   PROT 5.7* 03/30/2014 0424   ALBUMIN 2.8* 03/30/2014 0424   AST 63* 03/30/2014 0424   ALT 118* 03/30/2014 0424   ALKPHOS 344* 03/30/2014 0424   BILITOT 0.7 03/30/2014 0424   GFRNONAA 73* 03/30/2014 0424   GFRAA 85* 03/30/2014 0424    Time In Time Out Total Time Spent with [name] Total Overall Time  1600 1725 80 min 85 min    Greater than 50%  of this time was spent counseling  and coordinating care related to the above assessment and  plan.   Wadie Lessen NP  Palliative Medicine Team Team Phone # [phone] Pager [phone]  Discussed with Dr Rockne Menghini

## 2014-03-30 NOTE — Progress Notes (Addendum)
Visit the result of a friend seeing the chaplain in the unit and asking the chaplain to visit.  Mr Baltimore has been told that he may have cancer. He is awaiting tests to confirm this diagnosis. He is frightened and in shock. This comes weeks after the death of his wife of 60+ years who was diagnosed with cancer. He reports that exploratory surgery could not find her cancer, but she died of complications during the surgery. He is determined that the same thing does not happen to him. He is preparing himself for the news either way. This time of unknowing is making him very anxious. His fear has produced tears which he tries to hide.  Mr Lepak has no family, he has no children or relatives to provide support to him. He does have close friends who are very supportive. The person, who is his medical surrogate, is the daughter of these friends. She is currently driving from [facility] to take care of him. She is alerting her family and his other friends of his being in the hospital and for what reason.  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 His anxiety was eased by telling his stories of deployments and places he has seen. 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 He shared about his hard work and his blessed life with his wife. His deepest grief comes at the point of wishing that he had died first. He is alone with his memories.  Mr Risden is an active practicing Christian. He has a supportive community of faith, which may not yet know of his diagnosis and hospitalization. His Darrick Meigs faith is a strong factor in his decision making and should be considered in any subjects discussed with him.  Given the mental and spiritual turmoil he is going through only recently being told he has cancer, and still grieving the death of his wife, it is well advised to page a chaplain to assist him as needed. Chaplain are on call 24/7/365 to provide spiritual and emotional support.  Sallee Lange. Giancarlos Berendt, [place] (pager  [phone])

## 2014-03-30 NOTE — Progress Notes (Signed)
INITIAL NUTRITION ASSESSMENT  DOCUMENTATION CODES Per approved criteria  -Non-severe (moderate) malnutrition in the context of chronic illness  Pt meets criteria for moderate MALNUTRITION in the context of chronic illness as evidenced by PO intake < 75% for one month, 8% body weight loss in 3 months.   INTERVENTION: -Recommend Glucerna shake once daily -Recommend diet liberalization to encourage PO intake -Encouraged increasing intake of balanced snacks -Will continue to monitor  NUTRITION DIAGNOSIS: Unintentional wt loss related to chronic diease/inadequate oral intake as evidenced by 15 lb weight loss in 3 months.   Goal: Pt to meet >/= 90% of their estimated nutrition needs    Monitor:  Total protein/energy intake, labs, weights, diet order  Reason for Assessment: MST  80 y.o. male  Admitting Dx: Pancreatic mass  ASSESSMENT: [name] [name] is a 80 y.o. male with PMH significant for cardiomyopathy Ef 25 %, ECHO 2012, bladder cancer S/P turbt with chemo instillation, S/P cystoscopy with bilateral retrograde pyelogram bladder bx and renal washing on 03-09-2014, who presents with malaise, decrease appetite, weakness and hematuria  -Pt reported an unintentional weight loss of  15-20 lbs in past 2-3 months. Wife had recently passed 3 weeks ago, and had significantly decreased appetite/PO intake while assisting in her care -Diet recall indicates pt consuming cereal for breakfast, sandwich for lunch, and usually eats dinner at restaurant with friends -Recommend pt increase intake of snacks for muscle and weight maintenance. Pt with hx of DM2, and was willing to trial Glucerna once daily for snacks. Also encouraged intake of high protein snacks -cheese and crackers, graham crackers and peanut butter, yogurt, cottage cheese -MD noted pt with possible multifocal hepatic metastases. -Pt with 50% PO intake during admit, would benefit from diet liberalization to encourage appetite     Height: Ht Readings from Last 1 Encounters:  03/29/14 5\' 10"  (1.778 m)    Weight: Wt Readings from Last 1 Encounters:  03/29/14 171 lb 4.8 oz (77.7 kg)    Ideal Body Weight: 166 lbs  % Ideal Body Weight: 103%  Wt Readings from Last 10 Encounters:  03/29/14 171 lb 4.8 oz (77.7 kg)  03/09/14 171 lb 8.3 oz (77.8 kg)  03/09/14 171 lb 8.3 oz (77.8 kg)  03/01/14 172 lb (78.019 kg)  02/16/14 171 lb (77.565 kg)  01/31/14 174 lb (78.926 kg)  12/30/13 174 lb (78.926 kg)  12/20/13 183 lb (83.008 kg)  12/19/13 185 lb (83.915 kg)  10/11/13 187 lb 3.2 oz (84.913 kg)    Usual Body Weight: 185 lbs  % Usual Body Weight: 92%  BMI:  Body mass index is 24.58 kg/(m^2).  Estimated Nutritional Needs: Kcal: 1950-2150 Protein: 90-100 gram Fluid: >/=2000 ml/daily  Skin: WDL  Diet Order: Cardiac  EDUCATION NEEDS: -Education needs addressed   Intake/Output Summary (Last 24 hours) at 03/30/14 1059 Last data filed at 03/30/14 0956  Gross per 24 hour  Intake    243 ml  Output    600 ml  Net   -357 ml    Last BM: 9/10   Labs:   Recent Labs Lab 03/29/14 1610 03/30/14 0424  NA 132* 137  K 3.9 3.5*  CL 94* 100  CO2 25 26  BUN 20 17  CREATININE 1.02 0.91  CALCIUM 9.2 8.6  GLUCOSE 160* 130*    CBG (last 3)  No results found for this basename: GLUCAP,  in the last 72 hours  Scheduled Meds: . carvedilol  12.5 mg Oral BID WC  . cefTRIAXone (  ROCEPHIN)  IV  1 g Intravenous QHS  . docusate sodium  100 mg Oral BID  . famotidine  20 mg Oral Daily  . feeding supplement (GLUCERNA SHAKE)  237 mL Oral Q24H  . omega-3 acid ethyl esters  1 g Oral Daily  . pregabalin  75 mg Oral Daily  . sodium chloride  3 mL Intravenous Q12H    Continuous Infusions:   Past Medical History  Diagnosis Date  . Diverticulosis of colon (without mention of hemorrhage)   . Irritable bowel syndrome   . Personal history of malignant neoplasm of prostate     s/p  radioactive seed implants 2000   . Unspecified hereditary and idiopathic peripheral neuropathy   . Nonischemic cardiomyopathy cardiologist-  dr Aundra Dubin    EF 25%  per last echo 2012 and cardiologist note--  2007-  ef  40-45%;  2008- ef 35-40%;  2010- ef 30-35%;  2012- ef 25%  . Sinus bradycardia   . Hypertension   . Arthritis     hands  . Hyperlipidemia   . History of DVT of lower extremity     2002  ,   left leg  . History of adenomatous polyp of colon     2001  . History of basal cell carcinoma excision   . Anticoagulated on Coumadin   . Type 2 diabetes, diet controlled   . GERD (gastroesophageal reflux disease)   . LBBB (left bundle branch block)   . Wears glasses   . Wears hearing aid     BILATERAL  . Unsteady gait   . DDD (degenerative disc disease)     cervical and lumbar  . DJD (degenerative joint disease)     hips  . Lower urinary tract symptoms (LUTS)   . 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 Coronary artery disease     per cath 2002  non-obstructive cad  . Chronic systolic heart failure   . H/O fracture of nose     12-30-2013-  pt fell at home open nose bone fx    Past Surgical History  Procedure Laterality Date  . Knee arthroscopy Left 1991  . Ercp with sphincterotomy and stone removal  05/ 1993  &  05-11-2006  . Total knee arthroplasty  10/05/2011    Procedure: TOTAL KNEE ARTHROPLASTY;  Surgeon: Gearlean Alf, MD;  Location: [facility];  Service: Orthopedics;  Laterality: Right;  . Transurethral resection of bladder tumor N/A 09/30/2012    Procedure: TRANSURETHRAL RESECTION OF BLADDER TUMOR (TURBT);  Surgeon: Franchot Gallo, MD;  Location: [facility];  Service: Urology;  Laterality: N/A;  WITH MITOMYCIN INSTILLATION   . Eus N/A 01/04/2014    Procedure: ESOPHAGEAL ENDOSCOPIC ULTRASOUND (EUS) RADIAL;  Surgeon: [name], MD;  Location: [facility];  Service: Endoscopy;  Laterality: N/A;  . Radioactive  prostate seed implants  01/ 2000  . Mohs surgery  03/ 2009    LEFT EAR BASAL CELL CARCINOMA  . Tonsillectomy  AS CHILD  . Cholecystectomy open  1985  . Appendectomy  child  . Inguinal hernia repair Bilateral 1998    recurrent RIH  w/ repair 06-21-2006  . Total knee arthroplasty Left 02-21-2007    AND 04-21-2007  CLOSED MANIPULATION  . Cataract extraction w/ intraocular lens implant Right 2004  . Lumbar spine surgery  X2  . Cardiac catheterization  06-14-2001  dr Darnell Level brodie  non-obstructive CAD/  LM 30%,  pLAD 50%,  mLAD 30%,  RCA  &  CFX  irregularites ,  global hypokinesis,  ef 40%  . Cardiovascular stress test  02-07-2007   dr Darnell Level brodie    negative for ischemia,  low risk myoview perfusion study  . Transthoracic echocardiogram  01-28-2011    moderate LVH/  ef 25%/  diffuse hypokinesis/  mild LAE/  mild TR/  trivial AR & MR  . Cystoscopy w/ retrogrades Bilateral 03/09/2014    Procedure: CYSTOSCOPY WITH BILATERAL RETROGRADE PYELOGRAM BLADDER BX AND RENAL WASHINGS.;  Surgeon: Jorja Loa, MD;  Location: [facility];  Service: Urology;  Laterality: Bilateral;    [place] LDN Clinical Dietitian [phone]

## 2014-03-30 NOTE — Evaluation (Signed)
Physical Therapy Evaluation Patient Details Name: [name] [name] MRN: [id number] DOB: 11-Jan-1923 Today's Date: 03/30/2014   History of Present Illness  80 y.o. male with h/o B TKA, bladder cancer, DVT, chronic systolic heart failure/cardiomyopathy, fall with open nasal bone fx in June admitted for pancreatic mass suspicious for primary pancreatic malignancy with liver metastasis.  Clinical Impression  Pt currently with functional limitations due to the deficits listed below (see PT Problem List).  Pt will benefit from skilled PT to increase their independence and safety with mobility to allow discharge to the venue listed below.  Pt likely close to his mobility baseline however due to hx of falls would recommend supervision with mobility at home (states his last fall was at his wife's nsg home and he fell off the curb).  Pt and family/friends meeting with palliative care upon leaving room.     Follow Up Recommendations No PT follow up;Supervision for mobility/OOB    Equipment Recommendations  None recommended by PT    Recommendations for Other Services       Precautions / Restrictions Precautions Precautions: Fall      Mobility  Bed Mobility Overal bed mobility: Needs Assistance Bed Mobility: Supine to Sit;Sit to Supine     Supine to sit: Supervision;HOB elevated Sit to supine: Supervision;HOB elevated   General bed mobility comments: increased time  Transfers Overall transfer level: Needs assistance Equipment used: 1 person hand held assist Transfers: Sit to/from Stand Sit to Stand: Min guard         General transfer comment: posterior lean against bed to stabilize observed, min/guard for safety  Ambulation/Gait Ambulation/Gait assistance: Min guard Ambulation Distance (Feet): 400 Feet Assistive device: Rolling walker (2 wheeled) Gait Pattern/deviations: Step-through pattern;Wide base of support Gait velocity: decr   General Gait Details: initially unsteady so  provided RW, pt ambulated approx 250 feet without RW and then wanted to use hand rail, min/guard for safety, encouraged pt to use assistive device and have assist while ambulating for safety  Stairs            Wheelchair Mobility    Modified Rankin (Stroke Patients Only)       Balance Overall balance assessment: History of Falls                                           Pertinent Vitals/Pain Pain Assessment: No/denies pain    Home Living Family/patient expects to be discharged to:: Private residence Living Arrangements: Alone   Type of Home: House Home Access: Level entry     Home Layout: One level 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      Prior Function Level of Independence: Independent               Hand Dominance        Extremity/Trunk Assessment               Lower Extremity Assessment: Generalized weakness         Communication   Communication: No difficulties  Cognition Arousal/Alertness: Awake/alert Behavior During Therapy: WFL for tasks assessed/performed Overall Cognitive Status: Within Functional Limits for tasks assessed                      General Comments      Exercises        Assessment/Plan    PT  Assessment Patient needs continued PT services  PT Diagnosis Difficulty walking   PT Problem List Decreased strength;Decreased mobility;Decreased safety awareness  PT Treatment Interventions DME instruction;Gait training;Functional mobility training;Therapeutic activities;Therapeutic exercise;Patient/family education   PT Goals (Current goals can be found in the Care Plan section) Acute Rehab PT Goals PT Goal Formulation: With patient Time For Goal Achievement: 04/06/14 Potential to Achieve Goals: Good    Frequency Min 3X/week   Barriers to discharge        Co-evaluation               End of Session   Activity Tolerance: Patient tolerated treatment well Patient left:  in bed;with call bell/phone within reach;with family/visitor present (with NP from palliative care)           Time: 3300-7622 PT Time Calculation (min): 13 min   Charges:   PT Evaluation $Initial PT Evaluation Tier I: 1 Procedure PT Treatments $Gait Training: 8-22 mins   PT G Codes:          [name],KATHrine E 03/30/2014, 4:25 PM Carmelia Bake, PT, DPT 03/30/2014 Pager: [phone]

## 2014-03-30 NOTE — Care Management Note (Signed)
CARE MANAGEMENT NOTE 03/30/2014  Patient:  [name] [name], [name] [name]   Account Number:  [id number]  Date Initiated:  03/30/2014  Documentation initiated by:  Leafy Kindle  Subjective/Objective Assessment:   80 yo admitted with pancreatic mass.  S/P turbt with chemo instillation, S/P cystoscopy with bilateral retrograde pyelogram bladder bx and renal washing on 03-09-2014,     Action/Plan:   From home alone   Anticipated DC Date:  04/02/2014   Anticipated DC Plan:  [place]  CM consult      Choice offered to / List presented to:             Status of service:  In process, will continue to follow Medicare Important Message given?   (If response is "NO", the following Medicare IM given date fields will be blank) Date Medicare IM given:   Medicare IM given by:   Date Additional Medicare IM given:   Additional Medicare IM given by:    Discharge Disposition:    Per UR Regulation:  Reviewed for med. necessity/level of care/duration of stay  If discussed at [place] of Stay Meetings, dates discussed:    Comments:  03/30/14 Marney Doctor RN,BSN,NCM Pt from home alone.  CM will follow for dc needs.

## 2014-03-30 NOTE — Progress Notes (Addendum)
Progress Note   [name] [name] QTM:[id number] DOB: 05-Nov-1922 DOA: 03/29/2014 PCP: Drema Pry, DO   Brief Narrative:   [name] [name] is an 80 y.o. male with a PMH of DVT, chronic systolic heart failure/cardiomyopathy, last echocardiogram done in 2012 with EF of 25%, bladder cancer status post TURBT with chemotherapy instillation, status post cystoscopy with bilateral retrograde pyelogram and bladder biopsy/renal washing on 03/09/14, pancreatic mass incidentally discovered on imaging (declined further evaluation with endoscopic ultrasound/biopsy given multiple comorbidities and frailty) who was admitted 03/29/14 with chief complaint of malaise, decreased appetite, generalized weakness and hematuria. A CT scan done on admission showed a 4.9 cm enhancing lesion adjacent to the pancreatic head/uncinate process worrisome for a primary pancreatic adenocarcinoma and suspected multifocal hepatic metastasis.  Assessment/Plan:   Principal Problem:   Pancreatic mass suspicious for primary pancreatic malignancy with liver metastasis in the setting of generalized weakness, failure to thrive, and frailty  The patient likely has cancer related cachexia with diminished appetite, malaise, and progressive weakness related to an advanced malignancy with evidence of liver metastasis.  Initially referred to GI 12/19/13 as an outpatient for further evaluation after an incidentally discovered pancreatic mass was discovered on CT scan ordered by urologist.   He was scheduled to have an endoscopic ultrasound/biopsy as an outpatient but did not hold his Coumadin as instructed and the procedure was canceled and then deferred given his frailty as he was not felt to be a candidate for aggressive care including chemotherapy or surgery.  Patient appears to have very poor insight into his illness, has clear cognitive impairment, and likely does not grasp the severity of his illness or appreciate treatment considerations.  A  palliative care consult has been requested to meet with his significant other's (wife died 3 weeks ago, no children, no siblings, no family available for help with decision making).  Active Problems:   Transaminitis  Likely from liver metastasis.  Normocytic anemia  Likely multifactorial with hematuria in the setting of recent treatment with Coumadin, anemia of chronic disease secondary to advanced malignancy contributory.  No current indication for transfusion.    Moderate protein calorie malnutrition  Pt meets criteria for moderate MALNUTRITION in the context of chronic illness as evidenced by PO intake < 75% for one month, 8% body weight loss in 3 months.  Supplements per dietitian recommendations ordered. Diet liberalized.    Hypokalemia  Supplement orally.    DM  Appears to be diet controlled. Not on oral hypoglycemics or insulin therapy.  Monitor fasting blood glucoses q. a.m. and initiate treatment if consistently greater than 160.    HYPERTENSION, BENIGN  Blood pressure controlled on Coreg.    CARDIOMYOPATHY/SYSTOLIC HEART FAILURE, CHRONIC  Appears compensated. Continue Coreg. Lasix currently on hold secondary to dehydration.    Bladder cancer  Originally diagnosed 09/2012: Pathology showing low-grade papillary urothelial cancer. Status post TURBT with instillation of mitomycin 09/30/12. He has had persistent positive cytology on subsequent cystoscopies.  Status post cystoscopy with bilateral retrograde pyelogram, bladder biopsy and renal washings 03/09/14.  Biopsies negative for malignancy.    Hyponatremia  Likely secondary to dehydration. Resolved with IV fluids.    UTI (lower urinary tract infection)  Urinalysis negative for significant hematuria, positive for few bacteria.  Continue empiric ceftriaxone while awaiting urine culture results.    DVT Prophylaxis  Continue SCDs.  Code Status: Full. Family Communication: No family available, spoke with 2  close friends, POA Jeani Hawking [phone]) lives in [place] and  will be available later today. Disposition Plan: Home when stable.   IV Access:    Peripheral IV   Procedures and diagnostic studies:   Ct Abdomen Pelvis W Contrast 03/29/2014: 4.9 cm enhancing lesion adjacent to the pancreatic head/uncinate process, worrisome for primary pancreatic adenocarcinoma, progressed.  Tumor likely involves the SMA/SMV and undersurface of the portal vein. 9 mm short axis node in the porta hepatitis.  Suspected multifocal hepatic metastases, including a 3.2 cm lesion in the right hepatic dome.  Additional ancillary findings as above.      Medical Consultants:    None.   Other Consultants:    None.   Anti-Infectives:    None.  Subjective:    [name] [name]   Objective:    Filed Vitals:   03/29/14 2030 03/29/14 2143 03/29/14 2255 03/30/14 0439  BP: 117/51 118/60 122/49 102/45  Pulse: 66 82 67 58  Temp:  98.8 F (37.1 C) 97.8 F (36.6 C) 98.6 F (37 C)  TempSrc:  Oral Oral Oral  Resp:  18 18 18   Height:   5\' 10"  (1.778 m)   Weight:   77.7 kg (171 lb 4.8 oz)   SpO2: 94% 96% 98% 99%    Intake/Output Summary (Last 24 hours) at 03/30/14 0744 Last data filed at 03/30/14 0440  Gross per 24 hour  Intake      0 ml  Output    600 ml  Net   -600 ml    Exam: Gen:  NAD Neuro: Non-focal, but memory impaired Cardiovascular:  RRR, No M/R/G Respiratory:  Lungs CTAB Gastrointestinal:  Abdomen soft, NT/ND, + BS Extremities:  No C/E/C   Data Reviewed:    Labs: Basic Metabolic Panel:  Recent Labs Lab 03/29/14 1610 03/30/14 0424  NA 132* 137  K 3.9 3.5*  CL 94* 100  CO2 25 26  GLUCOSE 160* 130*  BUN 20 17  CREATININE 1.02 0.91  CALCIUM 9.2 8.6   GFR Estimated Creatinine Clearance: 57.9 ml/min (by C-G formula based on Cr of 0.91). Liver Function Tests:  Recent Labs Lab 03/29/14 1610 03/30/14 0424  AST 130* 63*  ALT 181* 118*  ALKPHOS 480* 344*  BILITOT  1.0 0.7  PROT 7.1 5.7*  ALBUMIN 3.4* 2.8*    Recent Labs Lab 03/29/14 1610  LIPASE 10*   Coagulation profile  Recent Labs Lab 03/29/14 1940  INR 1.40    CBC:  Recent Labs Lab 03/29/14 1610 03/30/14 0424  WBC 5.0 3.6*  NEUTROABS 4.1  --   HGB 10.5* 9.4*  HCT 30.7* 27.9*  MCV 92.5 93.0  PLT 194 170   Microbiology No results found for this or any previous visit (from the past 240 hour(s)).   Medications:   . carvedilol  12.5 mg Oral BID WC  . cefTRIAXone (ROCEPHIN)  IV  1 g Intravenous QHS  . docusate sodium  100 mg Oral BID  . famotidine  20 mg Oral Daily  . omega-3 acid ethyl esters  1 g Oral Daily  . pregabalin  75 mg Oral Daily  . sodium chloride  3 mL Intravenous Q12H   Continuous Infusions:   Time spent: 35 minutes with > 50% of time discussing current diagnostic test results, clinical impression and plan of care with the patient and his 2 visitors.    LOS: 1 day   RAMA,CHRISTINA  [facility] Pager [phone]. If unable to reach me by pager, please call my cell phone at [phone].  *Please refer to  [email or web address], password [email or web address] to get updated schedule on who will round on this patient, as hospitalists switch teams weekly. If 7PM-7AM, please contact night-coverage at [email or web address], password [email or web address] for any overnight needs.  03/30/2014, 7:44 AM

## 2014-03-31 DIAGNOSIS — G609 Hereditary and idiopathic neuropathy, unspecified: Secondary | ICD-10-CM

## 2014-03-31 LAB — GLUCOSE, CAPILLARY: GLUCOSE-CAPILLARY: 144 mg/dL — AB (ref 70–99)

## 2014-03-31 LAB — BASIC METABOLIC PANEL
Anion gap: 9 (ref 5–15)
BUN: 14 mg/dL (ref 6–23)
CHLORIDE: 99 meq/L (ref 96–112)
CO2: 27 mEq/L (ref 19–32)
Calcium: 8.8 mg/dL (ref 8.4–10.5)
Creatinine, Ser: 0.91 mg/dL (ref 0.50–1.35)
GFR calc Af Amer: 85 mL/min — ABNORMAL LOW (ref >90)
GFR, EST NON AFRICAN AMERICAN: 73 mL/min — AB (ref >90)
GLUCOSE: 137 mg/dL — AB (ref 70–99)
POTASSIUM: 3.8 meq/L (ref 3.7–5.3)
SODIUM: 135 meq/L — AB (ref 137–147)

## 2014-03-31 LAB — CBC
HCT: 29.3 % — ABNORMAL LOW (ref 39.0–52.0)
HEMOGLOBIN: 9.9 g/dL — AB (ref 13.0–17.0)
MCH: 32.1 pg (ref 26.0–34.0)
MCHC: 33.8 g/dL (ref 30.0–36.0)
MCV: 95.1 fL (ref 78.0–100.0)
PLATELETS: 169 10*3/uL (ref 150–400)
RBC: 3.08 MIL/uL — AB (ref 4.22–5.81)
RDW: 13.7 % (ref 11.5–15.5)
WBC: 4.5 10*3/uL (ref 4.0–10.5)

## 2014-03-31 LAB — URINE CULTURE
CULTURE: NO GROWTH
Colony Count: NO GROWTH

## 2014-03-31 LAB — CANCER ANTIGEN 19-9: CA 19-9: 318 U/mL — ABNORMAL HIGH (ref <35.0)

## 2014-03-31 NOTE — Progress Notes (Addendum)
Progress Note   MITCHAEL LUCKEY WJX:[id number] DOB: [birth year] DOA: 03/29/2014 PCP: Drema Pry, DO   Brief Narrative:   [name] [name] is an 80 y.o. male with a PMH of DVT, chronic systolic heart failure/cardiomyopathy, last echocardiogram done in 2012 with EF of 25%, bladder cancer status post TURBT with chemotherapy instillation, status post cystoscopy with bilateral retrograde pyelogram and bladder biopsy/renal washing on 03/09/14, pancreatic mass incidentally discovered on imaging (declined further evaluation with endoscopic ultrasound/biopsy given multiple comorbidities and frailty) who was admitted 03/29/14 with chief complaint of malaise, decreased appetite, generalized weakness and hematuria. A CT scan done on admission showed a 4.9 cm enhancing lesion adjacent to the pancreatic head/uncinate process worrisome for a primary pancreatic adenocarcinoma and suspected multifocal hepatic metastasis.  Assessment/Plan:   Principal Problem:   Pancreatic mass suspicious for primary pancreatic malignancy with liver metastasis in the setting of generalized weakness, failure to thrive, and frailty  The patient likely has cancer related cachexia with diminished appetite, malaise, and progressive weakness related to an advanced malignancy with evidence of liver metastasis.  Initially referred to GI 12/19/13 as an outpatient for further evaluation after an incidentally discovered pancreatic mass was discovered on CT scan ordered by urologist.   He was scheduled to have an endoscopic ultrasound/biopsy as an outpatient but did not hold his Coumadin as instructed and the procedure was canceled and then deferred given his frailty as he was not felt to be a candidate for aggressive care including chemotherapy or surgery.  Patient appears to have very poor insight into his illness, has clear cognitive impairment, and likely does not grasp the severity of his illness or appreciate treatment considerations. He  has no family to provide support.  Seen by palliative care team. The patient wants aggressive treatment/full CODE STATUS at this time.  IR consulted for consideration of CT-guided biopsy, MRI of the abdomen obtained (results pending) to guide treatment planning.  Active Problems:   Transaminitis  Likely from liver metastasis.  Normocytic anemia  Likely multifactorial with hematuria in the setting of recent treatment with Coumadin, anemia of chronic disease secondary to advanced malignancy contributory.  No current indication for transfusion.    Moderate protein calorie malnutrition  Pt meets criteria for moderate MALNUTRITION in the context of chronic illness as evidenced by PO intake < 75% for one month, 8% body weight loss in 3 months.  Supplements per dietitian recommendations ordered. Diet liberalized.    Hypokalemia  Supplemented orally.    DM  Appears to be diet controlled. Not on oral hypoglycemics or insulin therapy.  Monitor fasting blood glucoses q. a.m. and initiate treatment if consistently greater than 160.  Fasting a.m. glucose 144.    HYPERTENSION, BENIGN  Blood pressure controlled on Coreg.    CARDIOMYOPATHY/SYSTOLIC HEART FAILURE, CHRONIC  Appears compensated. Continue Coreg. Lasix currently on hold secondary to dehydration.    Bladder cancer  Originally diagnosed 09/2012: Pathology showing low-grade papillary urothelial cancer. Status post TURBT with instillation of mitomycin 09/30/12. He has had persistent positive cytology on subsequent cystoscopies.  Status post cystoscopy with bilateral retrograde pyelogram, bladder biopsy and renal washings 03/09/14.  Biopsies negative for malignancy.    Hyponatremia  Likely secondary to dehydration. Resolved with IV fluids.    UTI (lower urinary tract infection)  Urinalysis negative for significant hematuria, positive for few bacteria.  Continue empiric ceftriaxone while awaiting urine culture  results.    DVT Prophylaxis  Continue SCDs.  Code Status: Full. Family Communication:  2 friends updated at the bedside with the patient's permission, POA Jeani Hawking [phone]) lives in [place] and was updated 03/30/14. Disposition Plan: Home when stable.   IV Access:    Peripheral IV   Procedures and diagnostic studies:   Ct Abdomen Pelvis W Contrast 03/29/2014: 4.9 cm enhancing lesion adjacent to the pancreatic head/uncinate process, worrisome for primary pancreatic adenocarcinoma, progressed.  Tumor likely involves the SMA/SMV and undersurface of the portal vein. 9 mm short axis node in the porta hepatitis.  Suspected multifocal hepatic metastases, including a 3.2 cm lesion in the right hepatic dome.  Additional ancillary findings as above.     Dg Chest 2 View 03/30/2014 : 1. Mild cardiomegaly with mild pulmonary vascular prominence. No evidence of overt pulmonary edema. 2. Mild bibasilar atelectasis.      Medical Consultants:    Irena Reichmann, NP, Palliative Care   Other Consultants:    Physical therapy: No PT followup, supervision for mobility/00B.   Anti-Infectives:    Rocephin 03/29/14--->  Subjective:    Jacquelyne Balint is eating well, bowels moved yesterday. No black or bloody stools. Has some mild abdominal discomfort midepigastric area which has been present chronically for a while. No changes. Ambulated well with physical therapy yesterday.  Objective:    Filed Vitals:   03/30/14 0743 03/30/14 1445 03/30/14 1629 03/31/14 0602  BP: 125/58 117/50 139/57 112/56  Pulse: 65 51 59 62  Temp:  98.3 F (36.8 C)  98 F (36.7 C)  TempSrc:  Oral  Oral  Resp:  16  16  Height:      Weight:      SpO2:  96%  97%    Intake/Output Summary (Last 24 hours) at 03/31/14 0824 Last data filed at 03/31/14 0603  Gross per 24 hour  Intake    493 ml  Output   1850 ml  Net  -1357 ml    Exam: Gen:  NAD Neuro: Non-focal, but memory impaired Cardiovascular:  RRR, No  M/R/G Respiratory:  Lungs CTAB Gastrointestinal:  Abdomen soft, NT/ND, + BS Extremities:  No C/E/C   Data Reviewed:    Labs:  Basic Metabolic Panel:  Recent Labs Lab 03/29/14 1610 03/30/14 0424 03/31/14 0421  NA 132* 137 135*  K 3.9 3.5* 3.8  CL 94* 100 99  CO2 25 26 27   GLUCOSE 160* 130* 137*  BUN 20 17 14   CREATININE 1.02 0.91 0.91  CALCIUM 9.2 8.6 8.8   GFR Estimated Creatinine Clearance: 57.9 ml/min (by C-G formula based on Cr of 0.91). Liver Function Tests:  Recent Labs Lab 03/29/14 1610 03/30/14 0424  AST 130* 63*  ALT 181* 118*  ALKPHOS 480* 344*  BILITOT 1.0 0.7  PROT 7.1 5.7*  ALBUMIN 3.4* 2.8*    Recent Labs Lab 03/29/14 1610  LIPASE 10*   Coagulation profile  Recent Labs Lab 03/29/14 1940  INR 1.40    CBC:  Recent Labs Lab 03/29/14 1610 03/30/14 0424 03/31/14 0421  WBC 5.0 3.6* 4.5  NEUTROABS 4.1  --   --   HGB 10.5* 9.4* 9.9*  HCT 30.7* 27.9* 29.3*  MCV 92.5 93.0 95.1  PLT 194 170 169   CBG:  Recent Labs Lab 03/31/14 0744  GLUCAP 144*   Microbiology No results found for this or any previous visit (from the past 240 hour(s)).    Medications:   . carvedilol  12.5 mg Oral BID WC  . cefTRIAXone (ROCEPHIN)  IV  1 g Intravenous QHS  .  docusate sodium  100 mg Oral BID  . famotidine  20 mg Oral Daily  . feeding supplement (GLUCERNA SHAKE)  237 mL Oral Q24H  . omega-3 acid ethyl esters  1 g Oral Daily  . pregabalin  75 mg Oral Daily  . sodium chloride  3 mL Intravenous Q12H   Continuous Infusions:   Time spent: 35 minutes with > 50% of time discussing current diagnostic test results, clinical impression and plan of care with the patient and his 2 visitors.    LOS: 2 days   RAMA,CHRISTINA  [facility] Pager [phone]. If unable to reach me by pager, please call my cell phone at [phone].  *Please refer to [email or web address], password [email or web address] to get updated schedule on who will round on this patient, as hospitalists  switch teams weekly. If 7PM-7AM, please contact night-coverage at [email or web address], password [email or web address] for any overnight needs.  03/31/2014, 8:24 AM   Information printed out and reviewed with the patient/family:     In an effort to keep you and your family informed about your hospital stay, I am providing you with this information sheet. If you or your family have any questions, please do not hesitate to have the nursing staff page me to set up a meeting time.  Also note that the hospitalist doctors typically change on Tuesdays or Wednesdays to a different hospitalist doctor.  KATHERINE SYME 03/31/2014 2 (Number of days in the hospital)   Treatment team:  Dr. Margreta Journey Rama, Hospitalist (Internist)  Irena Reichmann, Palliative Care  Pertinent labs / studies: Your CT scan shows a 4.9 cm mass near your pancreas and a suspected liver mass that is 3.2 cm. An MRI is scheduled to evaluate this further and to plan a biopsy of the safest place to get a tissue sample. You also have elevation of a tumor marker consistent with a possible pancreatic cancer.   Principle Diagnosis: Masses in the pancreas and liver concerning for a primary pancreatic cancer.   Plan for today: Obtain an MRI of your abdomen, in anticipation of biopsy early next week.   Anticipated discharge date: Early next week.

## 2014-03-31 NOTE — Progress Notes (Signed)
Chaplain follow up visit as requested by Mr. [name] [name].   Mr. [name] [name] is less frightened and more determined concerning the possibility of having cancer. He is optimistic that if he in fact has cancer, that he will do everything in his power to overcome the condition. He will hold to his hope until there is documented proof of cancer being active in his body. His wife was diagnosed with cancer three to five months ago. He relates that during exploratory surgery no cancer was found, but complications with the procedure lead to her death three weeks ago. Mr. [name] [name] is determined not to allow the same thing to happen to him.   Mr [name] [name]'s faith stand is stronger today. Having worked out his fear and anxiety through prayer and mediation, he feels more confident that he will be able to take whatever news given him after testing for cancer.  Mr [name] [name] is supported by friends from [place], who will be driving to [facility] daily to look after his needs. The are his medical surrogates and are looking after his property while he is in the hospital. His relationship with this couple is like a father to his children, and older friend to younger close friend. He says that he allows them to know everything he knows. Check should be made to ensure the proper HIPPA releases are signed by Mr. [name] [name], as well as making sure his advanced directives are up to date. He as stated that the couple holds these documents, but with medical observations that indicate he may not be comprehending his condition and possible cognitive impairment it is wise to check the status of these documents.  Mr. [name] [name] gave evidence of being aware of his conditions, and the implications of a finding of cancer. He was at this visit mentally and spiritually at peace.  Follow up chaplain visits are requested or as required.  Sallee Lange. Ettel Albergo, DMin, MDiv, MA Chaplain

## 2014-04-01 DIAGNOSIS — R935 Abnormal findings on diagnostic imaging of other abdominal regions, including retroperitoneum: Secondary | ICD-10-CM

## 2014-04-01 DIAGNOSIS — C787 Secondary malignant neoplasm of liver and intrahepatic bile duct: Secondary | ICD-10-CM

## 2014-04-01 DIAGNOSIS — R1013 Epigastric pain: Secondary | ICD-10-CM

## 2014-04-01 HISTORY — DX: Secondary malignant neoplasm of liver and intrahepatic bile duct: C78.7

## 2014-04-01 LAB — GLUCOSE, CAPILLARY
GLUCOSE-CAPILLARY: 191 mg/dL — AB (ref 70–99)
GLUCOSE-CAPILLARY: 267 mg/dL — AB (ref 70–99)
Glucose-Capillary: 118 mg/dL — ABNORMAL HIGH (ref 70–99)
Glucose-Capillary: 152 mg/dL — ABNORMAL HIGH (ref 70–99)

## 2014-04-01 MED ORDER — INSULIN ASPART 100 UNIT/ML ~~LOC~~ SOLN
0.0000 [IU] | Freq: Every day | SUBCUTANEOUS | Status: DC
  Administered 2014-04-02: 2 [IU] via SUBCUTANEOUS

## 2014-04-01 MED ORDER — BISACODYL 10 MG RE SUPP
10.0000 mg | Freq: Every day | RECTAL | Status: DC | PRN
  Administered 2014-04-01: 10 mg via RECTAL
  Filled 2014-04-01 (×2): qty 1

## 2014-04-01 MED ORDER — INSULIN ASPART 100 UNIT/ML ~~LOC~~ SOLN
0.0000 [IU] | Freq: Three times a day (TID) | SUBCUTANEOUS | Status: DC
  Administered 2014-04-01: 5 [IU] via SUBCUTANEOUS
  Administered 2014-04-02: 3 [IU] via SUBCUTANEOUS
  Administered 2014-04-02 – 2014-04-03 (×3): 2 [IU] via SUBCUTANEOUS

## 2014-04-01 NOTE — Progress Notes (Signed)
Dr. Vernard Gambles has reviewed the patient's images and liver lesions non-specific and inaccessible for IR biopsy. IR recommends endoscopic biopsy of pancreatic mass.   Tsosie Billing PA-C Interventional Radiology  04/01/14  8:19 AM

## 2014-04-01 NOTE — Progress Notes (Addendum)
Progress Note   DARCY CORDNER HFW:[id number] DOB: Aug 17, 1922 DOA: 03/29/2014 PCP: Drema Pry, DO   Brief Narrative:   [name] [name] is an 80 y.o. male with a PMH of DVT, chronic systolic heart failure/cardiomyopathy, last echocardiogram done in 2012 with EF of 25%, bladder cancer status post TURBT with chemotherapy instillation, status post cystoscopy with bilateral retrograde pyelogram and bladder biopsy/renal washing on 03/09/14, pancreatic mass incidentally discovered on imaging (declined further evaluation with endoscopic ultrasound/biopsy given multiple comorbidities and frailty) who was admitted 03/29/14 with chief complaint of malaise, decreased appetite, generalized weakness and hematuria. A CT scan done on admission showed a 4.9 cm enhancing lesion adjacent to the pancreatic head/uncinate process worrisome for a primary pancreatic adenocarcinoma and suspected multifocal hepatic metastasis.  Assessment/Plan:   Principal Problem:   Pancreatic mass suspicious for primary pancreatic malignancy with liver metastasis in the setting of generalized weakness, failure to thrive, and frailty  The patient likely has cancer related cachexia with diminished appetite, malaise, and progressive weakness related to an advanced malignancy with evidence of liver metastasis.  Initially referred to GI 12/19/13 as an outpatient for further evaluation after an incidentally discovered pancreatic mass was discovered on CT scan ordered by urologist.   He was scheduled to have an endoscopic ultrasound/biopsy as an outpatient but did not hold his Coumadin as instructed and the procedure was canceled and then deferred given his frailty as he was not felt to be a candidate for aggressive care including chemotherapy or surgery.  Patient appears to have very poor insight into his illness, has clear cognitive impairment, and likely does not grasp the severity of his illness or appreciate treatment considerations. He  has no family to provide support, although he does have close friends that are quite supportive.  Seen by palliative care team. The patient wants aggressive treatment/full CODE STATUS at this time.  IR consulted for consideration of CT-guided biopsy, MRI of the abdomen shows a 4.4 X3 0.2 cm pancreatic head mass with vascular involvement concerning for primary pancreatic neoplasm, and multiple ill-defined hepatic lesions concerning for metastatic disease. He has severe intra-and extrahepatic biliary ductal dilatation with mild elevation of LFTs.  Unfortunately IR unable to safely do transcutaneous biopsy.  GI consult requested for consideration of EUS with biopsy per patient wishes to pursue a diagnosis with aggressive treatment.  CA-19-9 rising.  Active Problems:   Transaminitis  Likely from obstruction of the distal common bile duct secondary to liver metastasis.  We'll consult Dr. Ardis Hughs 04/02/14 for further treatment recommendations, and timing of stent placement.  Normocytic anemia  Likely multifactorial with hematuria in the setting of recent treatment with Coumadin, anemia of chronic disease secondary to advanced malignancy contributory.  No current indication for transfusion.    Moderate protein calorie malnutrition  Pt meets criteria for moderate MALNUTRITION in the context of chronic illness as evidenced by PO intake < 75% for one month, 8% body weight loss in 3 months.  Supplements per dietitian recommendations ordered. Diet liberalized.    Hypokalemia  Supplemented orally.    DM  Appears to be diet controlled. Not on oral hypoglycemics or insulin therapy at home.  Fasting a.m. glucose 144-191. We'll start SSI, insulin sensitive scale.    HYPERTENSION, BENIGN  Blood pressure controlled on Coreg.    CARDIOMYOPATHY/SYSTOLIC HEART FAILURE, CHRONIC  Appears compensated. Continue Coreg. Lasix currently on hold secondary to dehydration.    Bladder cancer  Originally  diagnosed 09/2012: Pathology showing low-grade papillary urothelial  cancer. Status post TURBT with instillation of mitomycin 09/30/12. He has had persistent positive cytology on subsequent cystoscopies.  Status post cystoscopy with bilateral retrograde pyelogram, bladder biopsy and renal washings 03/09/14.  Biopsies negative for malignancy.    Hyponatremia  Likely secondary to dehydration. Resolved with IV fluids.    Rule out UTI (lower urinary tract infection)  Urinalysis negative for significant hematuria, positive for few bacteria.  Discontinue empiric ceftriaxone, urine cultures negative.    DVT Prophylaxis  Continue SCDs.  Code Status: Full. Family Communication: Friend updated at the bedside with the patient's permission, POA Jeani Hawking [phone]) lives in [place] and was updated 04/01/14. Disposition Plan: Home when stable.   IV Access:    Peripheral IV   Procedures and diagnostic studies:   Ct Abdomen Pelvis W Contrast 03/29/2014: 4.9 cm enhancing lesion adjacent to the pancreatic head/uncinate process, worrisome for primary pancreatic adenocarcinoma, progressed.  Tumor likely involves the SMA/SMV and undersurface of the portal vein. 9 mm short axis node in the porta hepatitis.  Suspected multifocal hepatic metastases, including a 3.2 cm lesion in the right hepatic dome.  Additional ancillary findings as above.     Dg Chest 2 View 03/30/2014 : 1. Mild cardiomegaly with mild pulmonary vascular prominence. No evidence of overt pulmonary edema. 2. Mild bibasilar atelectasis.     Mr Abdomen W Wo Contrast 03/31/2014: 1. Subtle amorphous mass in the pancreatic head and proximal pancreatic body currently measuring 4.4 x 3.2 cm, causing distal common bile duct obstruction and demonstrating vascular involvement (SMA, SMV, splenic vein, splenic portal confluence and proximal portal vein), as above, highly concerning for primary pancreatic neoplasm. 2. Although there do appear to be  multiple ill-defined hepatic lesions, concerning for metastatic disease, these were poorly evaluated on today's examination secondary to patient respiratory motion. Continued attention on any future followup studies is recommended, as metastatic disease to the liver is suspected. 3. Severe intra and extrahepatic biliary ductal dilatation.     Medical Consultants:    Irena Reichmann, NP, Palliative Care  IR  Gastroenterology   Other Consultants:    Physical therapy: No PT followup, supervision for mobility/00B.   Anti-Infectives:    Rocephin 03/29/14---> 04/01/14  Subjective:   Jacquelyne Balint is eating well, no BM in the past couple of days.  Has ongoing mild abdominal discomfort midepigastric area. No changes.  Objective:    Filed Vitals:   03/31/14 1335 03/31/14 2041 03/31/14 2210 04/01/14 0520  BP: 111/52 122/56  136/80  Pulse: 61 55 48 71  Temp: 98.6 F (37 C) 97.5 F (36.4 C)  97.9 F (36.6 C)  TempSrc: Oral Oral  Oral  Resp: 16 16  16   Height:      Weight:      SpO2: 95% 95%  95%    Intake/Output Summary (Last 24 hours) at 04/01/14 0800 Last data filed at 04/01/14 3762  Gross per 24 hour  Intake   1539 ml  Output   2675 ml  Net  -1136 ml    Exam: Gen:  NAD Neuro: Non-focal, but memory impaired Cardiovascular:  RRR, No M/R/G Respiratory:  Lungs CTAB Gastrointestinal:  Abdomen soft, NT/ND, + BS Extremities:  No C/E/C   Data Reviewed:    Labs:  Basic Metabolic Panel:  Recent Labs Lab 03/29/14 1610 03/30/14 0424 03/31/14 0421  NA 132* 137 135*  K 3.9 3.5* 3.8  CL 94* 100 99  CO2 25 26 27   GLUCOSE 160* 130* 137*  BUN 20  17 14  CREATININE 1.02 0.91 0.91  CALCIUM 9.2 8.6 8.8   GFR Estimated Creatinine Clearance: 57.9 ml/min (by C-G formula based on Cr of 0.91). Liver Function Tests:  Recent Labs Lab 03/29/14 1610 03/30/14 0424  AST 130* 63*  ALT 181* 118*  ALKPHOS 480* 344*  BILITOT 1.0 0.7  PROT 7.1 5.7*  ALBUMIN 3.4* 2.8*     Recent Labs Lab 03/29/14 1610  LIPASE 10*   Coagulation profile  Recent Labs Lab 03/29/14 1940  INR 1.40    CBC:  Recent Labs Lab 03/29/14 1610 03/30/14 0424 03/31/14 0421  WBC 5.0 3.6* 4.5  NEUTROABS 4.1  --   --   HGB 10.5* 9.4* 9.9*  HCT 30.7* 27.9* 29.3*  MCV 92.5 93.0 95.1  PLT 194 170 169   CBG:  Recent Labs Lab 03/31/14 0744 04/01/14 0753  GLUCAP 144* 191*   Microbiology Recent Results (from the past 240 hour(s))  URINE CULTURE     Status: None   Collection Time    03/30/14  4:37 AM      Result Value Ref Range Status   Specimen Description URINE, CLEAN CATCH   Final   Special Requests NONE   Final   Culture  Setup Time     Final   Value: 03/30/2014 09:36     Performed at [place]     Final   Value: NO GROWTH     Performed at [facility]   Culture     Final   Value: NO GROWTH     Performed at [facility]   Report Status 03/31/2014 FINAL   Final      Medications:   . carvedilol  12.5 mg Oral BID WC  . cefTRIAXone (ROCEPHIN)  IV  1 g Intravenous QHS  . docusate sodium  100 mg Oral BID  . famotidine  20 mg Oral Daily  . feeding supplement (GLUCERNA SHAKE)  237 mL Oral Q24H  . omega-3 acid ethyl esters  1 g Oral Daily  . pregabalin  75 mg Oral Daily  . sodium chloride  3 mL Intravenous Q12H   Continuous Infusions:   Time spent: 25 minutes.    LOS: 3 days   [facility] Pager [phone]. If unable to reach me by pager, please call my cell phone at [phone].  *Please refer to [email or web address], password [email or web address] to get updated schedule on who will round on this patient, as hospitalists switch teams weekly. If 7PM-7AM, please contact night-coverage at [email or web address], password [email or web address] for any overnight needs.  04/01/2014, 8:00 AM

## 2014-04-02 ENCOUNTER — Telehealth: Payer: Self-pay

## 2014-04-02 ENCOUNTER — Other Ambulatory Visit: Payer: Self-pay

## 2014-04-02 DIAGNOSIS — R531 Weakness: Secondary | ICD-10-CM | POA: Diagnosis present

## 2014-04-02 DIAGNOSIS — I10 Essential (primary) hypertension: Secondary | ICD-10-CM

## 2014-04-02 DIAGNOSIS — C787 Secondary malignant neoplasm of liver and intrahepatic bile duct: Secondary | ICD-10-CM

## 2014-04-02 DIAGNOSIS — K8689 Other specified diseases of pancreas: Secondary | ICD-10-CM

## 2014-04-02 DIAGNOSIS — Z515 Encounter for palliative care: Secondary | ICD-10-CM

## 2014-04-02 DIAGNOSIS — K869 Disease of pancreas, unspecified: Secondary | ICD-10-CM

## 2014-04-02 HISTORY — DX: Weakness: R53.1

## 2014-04-02 LAB — GLUCOSE, CAPILLARY
GLUCOSE-CAPILLARY: 185 mg/dL — AB (ref 70–99)
GLUCOSE-CAPILLARY: 201 mg/dL — AB (ref 70–99)
Glucose-Capillary: 219 mg/dL — ABNORMAL HIGH (ref 70–99)
Glucose-Capillary: 186 mg/dL — ABNORMAL HIGH (ref 70–99)

## 2014-04-02 MED ORDER — TUBERCULIN PPD 5 UNIT/0.1ML ID SOLN
5.0000 [IU] | Freq: Once | INTRADERMAL | Status: DC
  Administered 2014-04-03: 5 [IU] via INTRADERMAL
  Filled 2014-04-02: qty 0.1

## 2014-04-02 MED ORDER — MAGNESIUM HYDROXIDE 400 MG/5ML PO SUSP
30.0000 mL | Freq: Once | ORAL | Status: AC
  Administered 2014-04-02: 30 mL via ORAL
  Filled 2014-04-02: qty 30

## 2014-04-02 NOTE — Progress Notes (Signed)
Physical Therapy Treatment Patient Details Name: [name] [name] MRN: [id number] DOB: 1923/04/28 Today's Date: May 01, 2014    History of Present Illness 80 y.o. male with h/o B TKA, bladder cancer, DVT, chronic systolic heart failure/cardiomyopathy, fall with open nasal bone fx in June admitted for pancreatic mass suspicious for primary pancreatic malignancy with liver metastasis.    PT Comments    Pt very agreeable to mobilize and steadiness improved with RW this date.  Follow Up Recommendations  No PT follow up;Supervision for mobility/OOB     Equipment Recommendations  None recommended by PT    Recommendations for Other Services       Precautions / Restrictions Precautions Precautions: Fall    Mobility  Bed Mobility               General bed mobility comments: pt up in recliner on arrival  Transfers Overall transfer level: Needs assistance Equipment used: Rolling walker (2 wheeled) Transfers: Sit to/from Stand Sit to Stand: Min guard         General transfer comment: verbal cues for hand placement  Ambulation/Gait Ambulation/Gait assistance: Min guard Ambulation Distance (Feet): 400 Feet Assistive device: Rolling walker (2 wheeled) Gait Pattern/deviations: Step-through pattern;Decreased stride length     General Gait Details: improved gait with RW, min/guard for safety, pt reports no fatigue and happy to be ambulating   Science writer    Modified Rankin (Stroke Patients Only)       Balance                                    Cognition Arousal/Alertness: Awake/alert Behavior During Therapy: WFL for tasks assessed/performed Overall Cognitive Status: Within Functional Limits for tasks assessed                      Exercises      General Comments        Pertinent Vitals/Pain Pain Assessment: No/denies pain    Home Living                      Prior Function            PT  Goals (current goals can now be found in the care plan section) Progress towards PT goals: Progressing toward goals    Frequency  Min 3X/week    PT Plan Current plan remains appropriate    Co-evaluation             End of Session   Activity Tolerance: Patient tolerated treatment well Patient left: in chair;with call bell/phone within reach;with family/visitor present     Time: 1025-8527 PT Time Calculation (min): 11 min  Charges:  $Gait Training: 8-22 mins                    G Codes:      [name] [name],KATHrine E May 01, 2014, 1:45 PM Carmelia Bake, PT, DPT 2014/05/01 Pager: [phone]

## 2014-04-02 NOTE — Progress Notes (Signed)
Progress Note   [name] [name] GUR:[id number] DOB: 01/17/1923 DOA: 03/29/2014 PCP: [name] Pry, DO   Brief Narrative:   [name] [name] is an 80 y.o. male with a PMH of DVT, chronic systolic heart failure/cardiomyopathy, last echocardiogram done in 2012 with EF of 25%, bladder cancer status post TURBT with chemotherapy instillation, status post cystoscopy with bilateral retrograde pyelogram and bladder biopsy/renal washing on 03/09/14, pancreatic mass incidentally discovered on imaging (declined further evaluation with endoscopic ultrasound/biopsy given multiple comorbidities and frailty) who was admitted 03/29/14 with chief complaint of malaise, decreased appetite, generalized weakness and hematuria. A CT scan done on admission showed a 4.9 cm enhancing lesion adjacent to the pancreatic head/uncinate process worrisome for a primary pancreatic adenocarcinoma and suspected multifocal hepatic metastasis.  Assessment/Plan:   Principal Problem:   Pancreatic mass suspicious for primary pancreatic malignancy with liver metastasis in the setting of generalized weakness, failure to thrive, and frailty  The patient likely has cancer related cachexia with diminished appetite, malaise, and progressive weakness related to an advanced malignancy with evidence of liver metastasis.  Initially referred to GI 12/19/13 as an outpatient for further evaluation after an incidentally discovered pancreatic mass was discovered on CT scan ordered by urologist.   He was scheduled to have an endoscopic ultrasound/biopsy as an outpatient but did not hold his Coumadin as instructed and the procedure was canceled and then deferred given his frailty as he was not felt to be a candidate for aggressive care including chemotherapy or surgery.  Patient appears to have very poor insight into his illness, has clear cognitive impairment, and likely does not grasp the severity of his illness or appreciate treatment considerations. He  has no family to provide support, although he does have close friends that are quite supportive.  Seen by palliative care team. The patient wants aggressive treatment/full CODE STATUS at this time.  IR consulted for consideration of CT-guided biopsy, MRI of the abdomen shows a 4.4 X3 0.2 cm pancreatic head mass with vascular involvement concerning for primary pancreatic neoplasm, and multiple ill-defined hepatic lesions concerning for metastatic disease. He has severe intra-and extrahepatic biliary ductal dilatation with mild elevation of LFTs.  Unfortunately IR unable to safely do transcutaneous biopsy.  GI consult requested for consideration of EUS with biopsy per patient wishes to pursue a diagnosis with aggressive treatment. Procedure planned for 04/05/14. Can be done inpatient/outpatient.  CA-19-9 rising.  Active Problems:   Transaminitis  Likely from obstruction of the distal common bile duct secondary to liver metastasis.  4 endoscopic ultrasound 04/05/14.  Normocytic anemia  Likely multifactorial with hematuria in the setting of recent treatment with Coumadin, anemia of chronic disease secondary to advanced malignancy contributory.  No current indication for transfusion.    Moderate protein calorie malnutrition  Pt meets criteria for moderate MALNUTRITION in the context of chronic illness as evidenced by PO intake < 75% for one month, 8% body weight loss in 3 months.  Supplements per dietitian recommendations ordered. Diet liberalized.    Hypokalemia  Supplemented orally.    DM  Appears to be diet controlled. Not on oral hypoglycemics or insulin therapy at home.  Fasting a.m. glucose 144-191. We'll start SSI, insulin sensitive scale.    HYPERTENSION, BENIGN  Blood pressure controlled on Coreg.    CARDIOMYOPATHY/SYSTOLIC HEART FAILURE, CHRONIC  Appears compensated. Continue Coreg. Lasix currently on hold secondary to dehydration.    Bladder cancer  Originally  diagnosed 09/2012: Pathology showing low-grade papillary urothelial cancer. Status  post TURBT with instillation of mitomycin 09/30/12. He has had persistent positive cytology on subsequent cystoscopies.  Status post cystoscopy with bilateral retrograde pyelogram, bladder biopsy and renal washings 03/09/14.  Biopsies negative for malignancy.    Hyponatremia  Likely secondary to dehydration. Resolved with IV fluids.    Rule out UTI (lower urinary tract infection)  Urinalysis negative for significant hematuria, positive for few bacteria.  Discontinue empiric ceftriaxone, urine cultures negative.    DVT Prophylaxis  Continue SCDs.  Code Status: Full. Family Communication: Friend updated at the bedside with the patient's permission, POA [name] [name] [phone]) lives in [place] and was updated 04/02/14 and appraised of plan for endoscopic ultrasound 04/05/14. Disposition Plan: ALF when stable.   IV Access:    Peripheral IV   Procedures and diagnostic studies:   Ct Abdomen Pelvis W Contrast 03/29/2014: 4.9 cm enhancing lesion adjacent to the pancreatic head/uncinate process, worrisome for primary pancreatic adenocarcinoma, progressed.  Tumor likely involves the SMA/SMV and undersurface of the portal vein. 9 mm short axis node in the porta hepatitis.  Suspected multifocal hepatic metastases, including a 3.2 cm lesion in the right hepatic dome.  Additional ancillary findings as above.     Dg Chest 2 View 03/30/2014 : 1. Mild cardiomegaly with mild pulmonary vascular prominence. No evidence of overt pulmonary edema. 2. Mild bibasilar atelectasis.     Mr Abdomen W Wo Contrast 03/31/2014: 1. Subtle amorphous mass in the pancreatic head and proximal pancreatic body currently measuring 4.4 x 3.2 cm, causing distal common bile duct obstruction and demonstrating vascular involvement (SMA, SMV, splenic vein, splenic portal confluence and proximal portal vein), as above, highly concerning for primary  pancreatic neoplasm. 2. Although there do appear to be multiple ill-defined hepatic lesions, concerning for metastatic disease, these were poorly evaluated on today's examination secondary to patient respiratory motion. Continued attention on any future followup studies is recommended, as metastatic disease to the liver is suspected. 3. Severe intra and extrahepatic biliary ductal dilatation.     Medical Consultants:    Irena Reichmann, NP, Palliative Care  Dr. Vernard Gambles, IR  Dr. Silvano Rusk, Gastroenterology  Other Consultants:    Physical therapy: No PT followup, supervision for mobility/00B.   Anti-Infectives:    Rocephin 03/29/14---> 04/01/14  Subjective:   [name] [name] is eating well, still reporting no BM in the past couple of days.  Has ongoing mild abdominal discomfort midepigastric area. No changes.  Objective:    Filed Vitals:   04/01/14 1345 04/01/14 2117 04/01/14 2120 04/02/14 0628  BP: 99/49 115/50  127/51  Pulse: 53 59 56 81  Temp: 98.1 F (36.7 C) 98.4 F (36.9 C)  98.5 F (36.9 C)  TempSrc: Oral Oral  Oral  Resp: 16 16  20   Height:      Weight:      SpO2: 96% 95%  98%    Intake/Output Summary (Last 24 hours) at 04/02/14 0817 Last data filed at 04/02/14 0630  Gross per 24 hour  Intake   1240 ml  Output   3275 ml  Net  -2035 ml    Exam: Gen:  NAD Neuro: Non-focal, but memory impaired Cardiovascular:  RRR, No M/R/G Respiratory:  Lungs CTAB Gastrointestinal:  Abdomen soft, NT/ND, + BS Extremities:  No C/E/C   Data Reviewed:    Labs:  Basic Metabolic Panel:  Recent Labs Lab 03/29/14 1610 03/30/14 0424 03/31/14 0421  NA 132* 137 135*  K 3.9 3.5* 3.8  CL 94* 100 99  CO2 25 26 27   GLUCOSE 160* 130* 137*  BUN 20 17 14   CREATININE 1.02 0.91 0.91  CALCIUM 9.2 8.6 8.8   GFR Estimated Creatinine Clearance: 57.9 ml/min (by C-G formula based on Cr of 0.91). Liver Function Tests:  Recent Labs Lab 03/29/14 1610 03/30/14 0424  AST  130* 63*  ALT 181* 118*  ALKPHOS 480* 344*  BILITOT 1.0 0.7  PROT 7.1 5.7*  ALBUMIN 3.4* 2.8*    Recent Labs Lab 03/29/14 1610  LIPASE 10*   Coagulation profile  Recent Labs Lab 03/29/14 1940  INR 1.40    CBC:  Recent Labs Lab 03/29/14 1610 03/30/14 0424 03/31/14 0421  WBC 5.0 3.6* 4.5  NEUTROABS 4.1  --   --   HGB 10.5* 9.4* 9.9*  HCT 30.7* 27.9* 29.3*  MCV 92.5 93.0 95.1  PLT 194 170 169   CBG:  Recent Labs Lab 03/31/14 0744 04/01/14 0753 04/01/14 1159 04/01/14 1631 04/01/14 2114  GLUCAP 144* 191* 267* 118* 152*   Microbiology Recent Results (from the past 240 hour(s))  URINE CULTURE     Status: None   Collection Time    03/30/14  4:37 AM      Result Value Ref Range Status   Specimen Description URINE, CLEAN CATCH   Final   Special Requests NONE   Final   Culture  Setup Time     Final   Value: 03/30/2014 09:36     Performed at [facility]     Final   Value: NO GROWTH     Performed at [facility]   Culture     Final   Value: NO GROWTH     Performed at [facility]   Report Status 03/31/2014 FINAL   Final      Medications:   . carvedilol  12.5 mg Oral BID WC  . docusate sodium  100 mg Oral BID  . famotidine  20 mg Oral Daily  . feeding supplement (GLUCERNA SHAKE)  237 mL Oral Q24H  . insulin aspart  0-5 Units Subcutaneous QHS  . insulin aspart  0-9 Units Subcutaneous TID WC  . omega-3 acid ethyl esters  1 g Oral Daily  . pregabalin  75 mg Oral Daily  . sodium chloride  3 mL Intravenous Q12H   Continuous Infusions:   Time spent: 25 minutes.    LOS: 4 days   [facility] Pager [phone]. If unable to reach me by pager, please call my cell phone at [phone].  *Please refer to [email or web address], password [email or web address] to get updated schedule on who will round on this patient, as hospitalists switch teams weekly. If 7PM-7AM, please contact night-coverage at [email or web address], password [email or web address]  for any overnight needs.  04/02/2014, 8:17 AM

## 2014-04-02 NOTE — Consult Note (Addendum)
Referring Provider: [facility] Primary Care Physician:  Drema Pry, DO Primary Gastroenterologist:  Dr. Ardis Hughs  Reason for Consultation:  Pancreatic mass     HPI: [name] [name] is a 80 y.o. male with a PMH of DVT, chronic systolic heart failure/cardiomyopathy, last echocardiogram done in 2012 with EF of 25%, bladder cancer status post TURBT with chemotherapy instillation, status post cystoscopy with bilateral retrograde pyelogram and bladder biopsy/renal washing on 03/09/14, pancreatic mass incidentally discovered on imaging (declined further evaluation with endoscopic ultrasound/biopsy given multiple comorbidities and frailty) who was admitted 03/29/14 with chief complaint of malaise, decreased appetite, generalized weakness and hematuria. Pt had stopped his coumadin 5 days prior to admission due to hematuria. A CT scan done on admission showed a 4.9 cm enhancing lesion adjacent to the pancreatic head/uncinate process worrisome for a primary pancreatic adenocarcinoma and suspected multifocal hepatic metastasis.  Patient  had undergone CT scan of the abdomen and pelvis with contrast on 12/06/2013 done at [facility] urology. CT scan showed borderline cardiomegaly and abnormal enhancing infiltrative soft tissue density appearing to occlude the distal common bile duct and tracking in the vicinity of the pancreatic head and in between the SMA and SMV concerning for pancreatic adenocarcinoma or possible metastatic disease. There is intraoperative and extrahepatic biliary ductal dilation. The soft tissue mass measures 2.8 x 2.9 cm and is difficult to separate from the adjacent pancreatic head.  Patient had undergone very remote cholecystectomy some 30 years ago. He says he has been having epigastric pain over the past year, somewhat progressive and describes it as sharp and located in the epigastrium. Over the summer he has been experiencing epigastric pain and wt loss. Pt had an EUS 01/04/14 that showed a  normal UGI tract except for evidence of previous biliary sphinterotomy. CBD dilated diffusely up to 1.8 cm, no sign of masses or stones.     Past Medical History  Diagnosis Date  . Diverticulosis of colon (without mention of hemorrhage)   . Irritable bowel syndrome   . Personal history of malignant neoplasm of prostate     s/p  radioactive seed implants 2000  . Unspecified hereditary and idiopathic peripheral neuropathy   . Nonischemic cardiomyopathy cardiologist-  dr Aundra Dubin    EF 25%  per last echo 2012 and cardiologist note--  2007-  ef  40-45%;  2008- ef 35-40%;  2010- ef 30-35%;  2012- ef 25%  . Sinus bradycardia   . Hypertension   . Arthritis     hands  . Hyperlipidemia   . History of DVT of lower extremity     2002  ,   left leg  . History of adenomatous polyp of colon     2001  . History of basal cell carcinoma excision   . Anticoagulated on Coumadin   . Type 2 diabetes, diet controlled   . GERD (gastroesophageal reflux disease)   . LBBB (left bundle branch block)   . Wears glasses   . Wears hearing aid     BILATERAL  . Unsteady gait   . DDD (degenerative disc disease)     cervical and lumbar  . DJD (degenerative joint disease)     hips  . Lower urinary tract symptoms (LUTS)   . 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 Coronary artery disease     per cath 2002  non-obstructive cad  . Chronic systolic heart  failure   . H/O fracture of nose     12-30-2013-  pt fell at home open nose bone fx    Past Surgical History  Procedure Laterality Date  . Knee arthroscopy Left 1991  . Ercp with sphincterotomy and stone removal  05/ 1993  &  05-11-2006  . Total knee arthroplasty  10/05/2011    Procedure: TOTAL KNEE ARTHROPLASTY;  Surgeon: Gearlean Alf, MD;  Location: [facility];  Service: Orthopedics;  Laterality: Right;  . Transurethral resection of bladder tumor N/A 09/30/2012     Procedure: TRANSURETHRAL RESECTION OF BLADDER TUMOR (TURBT);  Surgeon: Franchot Gallo, MD;  Location: [facility];  Service: Urology;  Laterality: N/A;  WITH MITOMYCIN INSTILLATION   . Eus N/A 01/04/2014    Procedure: ESOPHAGEAL ENDOSCOPIC ULTRASOUND (EUS) RADIAL;  Surgeon: [name], MD;  Location: [facility]COPY;  Service: Endoscopy;  Laterality: N/A;  . Radioactive prostate seed implants  01/ 2000  . Mohs surgery  03/ 2009    LEFT EAR BASAL CELL CARCINOMA  . Tonsillectomy  AS CHILD  . Cholecystectomy open  1985  . Appendectomy  child  . Inguinal hernia repair Bilateral 1998    recurrent RIH  w/ repair 06-21-2006  . Total knee arthroplasty Left 02-21-2007    AND 04-21-2007  CLOSED MANIPULATION  . Cataract extraction w/ intraocular lens implant Right 2004  . Lumbar spine surgery  X2  . Cardiac catheterization  06-14-2001  dr Darnell Level brodie    non-obstructive CAD/  LM 30%,  pLAD 50%,  mLAD 30%,  RCA  &  CFX  irregularites ,  global hypokinesis,  ef 40%  . Cardiovascular stress test  02-07-2007   dr Darnell Level brodie    negative for ischemia,  low risk myoview perfusion study  . Transthoracic echocardiogram  01-28-2011    moderate LVH/  ef 25%/  diffuse hypokinesis/  mild LAE/  mild TR/  trivial AR & MR  . Cystoscopy w/ retrogrades Bilateral 03/09/2014    Procedure: CYSTOSCOPY WITH BILATERAL RETROGRADE PYELOGRAM BLADDER BX AND RENAL WASHINGS.;  Surgeon: Jorja Loa, MD;  Location: [facility];  Service: Urology;  Laterality: Bilateral;    Prior to Admission medications   Medication Sig Start Date End Date Taking? Authorizing Provider  carvedilol (COREG) 12.5 MG tablet Take 12.5 mg by mouth 2 (two) times daily with a meal.   Yes Historical Provider, MD  fish oil-omega-3 fatty acids 1000 MG capsule Take 1 g by mouth daily.   Yes Historical Provider, MD  furosemide (LASIX) 40 MG tablet Take 40 mg by mouth every morning.   Yes Historical Provider, MD  nitroGLYCERIN  (NITROSTAT) 0.4 MG SL tablet Place 1 tablet (0.4 mg total) under the tongue every 5 (five) minutes as needed for chest pain. 11/22/13  Yes Thompson Grayer, MD  polyethylene glycol (MIRALAX / GLYCOLAX) packet Take 17 g by mouth 2 (two) times daily as needed for mild constipation.   Yes Historical Provider, MD  pravastatin (PRAVACHOL) 40 MG tablet Take 40 mg by mouth every evening.    Yes Historical Provider, MD  pregabalin (LYRICA) 75 MG capsule Take 75 mg by mouth daily.   Yes Historical Provider, MD  ranitidine (ZANTAC) 150 MG tablet Take 150 mg by mouth 2 (two) times daily.   Yes Historical Provider, MD  zolpidem (AMBIEN) 10 MG tablet Take 10 mg by mouth at bedtime as needed for sleep.   Yes Historical Provider, MD    Current Facility-Administered Medications  Medication Dose Route Frequency Provider Last Rate Last Dose  . 0.9 %  sodium chloride infusion  250 mL Intravenous PRN Belkys A Regalado, MD      . acetaminophen (TYLENOL) tablet 650 mg  650 mg Oral Q6H PRN Belkys A Regalado, MD       Or  . acetaminophen (TYLENOL) suppository 650 mg  650 mg Rectal Q6H PRN Belkys A Regalado, MD      . bisacodyl (DULCOLAX) suppository 10 mg  10 mg Rectal Daily PRN Venetia Maxon Rama, MD   10 mg at 04/01/14 1547  . carvedilol (COREG) tablet 12.5 mg  12.5 mg Oral BID WC Belkys A Regalado, MD   12.5 mg at 04/01/14 1656  . docusate sodium (COLACE) capsule 100 mg  100 mg Oral BID Belkys A Regalado, MD   100 mg at 04/01/14 2116  . famotidine (PEPCID) tablet 20 mg  20 mg Oral Daily Belkys A Regalado, MD   20 mg at 04/01/14 1016  . feeding supplement (GLUCERNA SHAKE) (GLUCERNA SHAKE) liquid 237 mL  237 mL Oral Q24H Hazle Coca, RD   237 mL at 03/31/14 1500  . HYDROcodone-acetaminophen (NORCO/VICODIN) 5-325 MG per tablet 1-2 tablet  1-2 tablet Oral Q4H PRN Elmarie Shiley, MD   2 tablet at 04/01/14 1933  . insulin aspart (novoLOG) injection 0-5 Units  0-5 Units Subcutaneous QHS Christina P Rama, MD      .  insulin aspart (novoLOG) injection 0-9 Units  0-9 Units Subcutaneous TID WC Venetia Maxon Rama, MD   5 Units at 04/01/14 1205  . nitroGLYCERIN (NITROSTAT) SL tablet 0.4 mg  0.4 mg Sublingual Q5 min PRN Belkys A Regalado, MD      . omega-3 acid ethyl esters (LOVAZA) capsule 1 g  1 g Oral Daily Belkys A Regalado, MD   1 g at 04/01/14 1016  . ondansetron (ZOFRAN) tablet 4 mg  4 mg Oral Q6H PRN Belkys A Regalado, MD       Or  . ondansetron (ZOFRAN) injection 4 mg  4 mg Intravenous Q6H PRN Belkys A Regalado, MD      . polyethylene glycol (MIRALAX / GLYCOLAX) packet 17 g  17 g Oral BID PRN Belkys A Regalado, MD   17 g at 03/31/14 2205  . pregabalin (LYRICA) capsule 75 mg  75 mg Oral Daily Belkys A Regalado, MD   75 mg at 04/01/14 1016  . sodium chloride 0.9 % injection 3 mL  3 mL Intravenous Q12H Belkys A Regalado, MD   3 mL at 04/01/14 2118  . sodium chloride 0.9 % injection 3 mL  3 mL Intravenous PRN Belkys A Regalado, MD      . zolpidem (AMBIEN) tablet 5 mg  5 mg Oral QHS PRN Belkys A Regalado, MD   5 mg at 04/01/14 2117    Allergies as of 03/29/2014  . (No Known Allergies)    Family History  Problem Relation Age of Onset  . Throat cancer Brother   . Coronary artery disease Father     deceased    History   Social History  . Marital Status: Widowed    Spouse Name: N/A    Number of Children: 0  . Years of Education: N/A   Occupational History  . Retired    Social History Main Topics  . Smoking status: Former Smoker -- 1.00 packs/day for 10 years    Types: Cigarettes    Quit date: 07/20/1985  . Smokeless tobacco: Never  Used  . Alcohol Use: No  . Drug Use: No   Social History Narrative   Married, no children.    Review of Systems: Gen: Denies any fever, chills, sweats, anorexia, fatigue, weakness, malaise, weight loss, and sleep disorder CV: Denies chest pain, angina, palpitations, syncope, orthopnea, PND, peripheral edema, and claudication. Resp: Denies dyspnea at rest,  dyspnea with exercise, cough, sputum, wheezing, coughing up blood, and pleurisy. GI: Denies vomiting blood, jaundice, and fecal incontinence.   Denies dysphagia or odynophagia. GU : Denies urinary burning, blood in urine, urinary frequency, urinary hesitancy, nocturnal urination, and urinary incontinence. MS: Denies joint pain, limitation of movement, and swelling, stiffness, low back pain, extremity pain. Denies muscle weakness, cramps, atrophy.  Derm: Denies rash, itching, dry skin, hives, moles, warts, or unhealing ulcers.  Psych: Denies depression, anxiety, memory loss, suicidal ideation, hallucinations, paranoia, and confusion. Heme: Denies bruising, bleeding, and enlarged lymph nodes. Neuro:  Denies any headaches, dizziness, paresthesias. Endo:  Denies any problems with DM, thyroid, adrenal function.  Physical Exam: Vital signs in last 24 hours: Temp:  [98.1 F (36.7 C)-98.5 F (36.9 C)] 98.5 F (36.9 C) (09/14 0628) Pulse Rate:  [53-81] 81 (09/14 0628) Resp:  [16-20] 20 (09/14 0628) BP: (99-127)/(49-51) 127/51 mmHg (09/14 0628) SpO2:  [95 %-98 %] 98 % (09/14 0628) Last BM Date: 04/01/14 General:   Alert,male,  Well-developed, well-nourished, pleasant and cooperative in NAD Head:  Normocephalic and atraumatic. Eyes:  Sclera clear, no icterus.   Conjunctiva pink. Ears:  Normal auditory acuity. Nose:  No deformity, discharge,  or lesions. Mouth:  No deformity or lesions.   Neck:  Supple; no masses or thyromegaly. Lungs:  Clear throughout to auscultation.   No wheezes, crackles, or rhonchi. eart:  Regular rate and rhythm; no murmurs, clicks, rubs,  or gallops. Abdomen:  Soft,nontender, BS active,nonpalp mass or hsm.   Rectal:  Deferred  Msk:  Symmetrical without gross deformities. . Pulses:  Normal pulses noted. Extremities:  Without clubbing or edema. Neurologic:  Alert and  oriented x4;  grossly normal neurologically. Skin:  Intact without significant lesions or  rashes.. Psych:  Alert and cooperative. Normal mood and affect    Lab Results:  Recent Labs  03/31/14 0421  WBC 4.5  HGB 9.9*  HCT 29.3*  PLT 169   BMET  Recent Labs  03/31/14 0421  NA 135*  K 3.8  CL 99  CO2 27  GLUCOSE 137*  BUN 14  CREATININE 0.91  CALCIUM 8.8   CA 19-9 318 on 03/30/14  Hepatic function tests on 03/30/14:  Alk phos 344, ALT 118, AST 63, alb 2.8, tot prot 5.7, t bili 0.7   Studies/Results: EUS form 01/04/14: Endoscopic findings with ERCP scope, radial and linear echoendoscopes: 1. Normal UGI tract except for evidence of previous biliary sphincterotomy EUS findings: 1. The pancreatic parenchyma was very atrophic and fatty replaced. No discrete pancreatic masses or tumors were seen. 2. CBD was dilated diffusely, up to 1.8cm, no clear sign of masses, stones. 3. Main pancreatid duct was normal appearing. 4. I was unable to visualize the lesion described on recent CT scan (abnormally enhancing soft tissue density in region of distal CBD, pancreatic head). Impression: The bile duct is diffusely dilated however I could not identify a clear mass lesion in or near the pancreas. Given his advanced age (82), frail state, recent fall resulting in facial injuries and the fact that his LFTs are normal and his CBD is known to be chronically dilated,  I elected not to proceed with ERCP. Rather, he will return to my office in 6-8 weeks, labs a day or so prior, and will follow him for signficant clinical change   Study Result    CLINICAL DATA: Pancreatic mass tissues for potential primary  pancreatic malignancy with liver metastasis. Generalized weakness.  Failure to thrive.  EXAM:  MRI ABDOMEN WITHOUT AND WITH CONTRAST  TECHNIQUE:  Multiplanar multisequence MR imaging of the abdomen was performed  both before and after the administration of intravenous contrast.  CONTRAST: 18 mL of MultiHance.  COMPARISON: MRI of the abdomen 05/04/2013.  FINDINGS:  As with  the recent CT examination, there is abnormal soft tissue  that is increasing in prominence in the region of the head and  proximal body of the pancreas. This soft tissues relatively  nondescript and difficult to discretely visualize, however, when  compared to remote prior CT scan 05/10/2006, it is apparent that the  pancreas has largely atrophied, and this soft tissue which generally  looks like normal pancreatic tissue is grossly abnormal in the  setting of prior severe pancreatic atrophy. This is amorphous in  appearance and therefore difficult to discretely measure, however,  this is estimated to measure approximately 4.4 x 3.2 cm (image 58 of  series 1703). This encapsulates the distal aspect of the common bile  duct which is markedly narrowed (accounting for the severe intra and  extrahepatic biliary ductal dilatation), and also circumferentially  encases the proximal superior mesenteric artery, as well as the  distal superior mesenteric vein, distal splenic vein, splenoportal  confluence and the proximal portal vein, all of which appear mildly  narrowed as they transition through this lesion. No definite  surrounding lymphadenopathy.  Common bile duct measures up to 20 mm in the porta hepatis, and  there is severe intrahepatic biliary ductal dilatation. Today's  study is limited by patient respiratory motion, particularly on the  post-contrast images. This limits assessment for hepatic metastases.  There are a couple of benign appearing hepatic lesions which are low  signal intensity on T1 weighted images, high signal intensity on T2  weighted images, and do not enhance, compatible with simple cysts,  in the anterior aspect of segment 2 and in segment 6. However, there  are several other lesions which are poorly visualized on  post-contrast images secondary to motion, better best displayed on  the respiratory triggered T2 fat saturated sequences, including the  previously  described lesion in segment 8 of the liver near the dome  which measures approximately 2.9 x 2.6 cm (image 6 of series 5).  This lesion may demonstrate some low-level diffusion restriction.  Status post cholecystectomy. The appearance of the spleen, bilateral  adrenal glands and bilateral kidneys is unremarkable.  IMPRESSION:  1. Subtle amorphous mass in the pancreatic head and proximal  pancreatic body currently measuring 4.4 x 3.2 cm, causing distal  common bile duct obstruction and demonstrating vascular involvement  (SMA, SMV, splenic vein, splenic portal confluence and proximal  portal vein), as above, highly concerning for primary pancreatic  neoplasm.  2. Although there do appear to be multiple ill-defined hepatic  lesions, concerning for metastatic disease, these were poorly  evaluated on today's examination secondary to patient respiratory  motion. Continued attention on any future followup studies is  recommended, as metastatic disease to the liver is suspected.  3. Severe intra and extrahepatic biliary ductal dilatation.  Electronically Signed  By: Vinnie Langton M.D.  On: 03/31/2014 16:58   .  Colonoscopy 11/07 nl except for diverticulosis ERCP 05/11/06 soft amorphous stones removed, ? Biliary colic fistula IMPRESSION/PLAN: 1.Pancreatic mass with liver metastasis in setting of wt loss, weakness, failure to thrive. IR has seen pt in consultation and is unable to safely do transcutaneous biopsy. CA 19-9 rising from June.  Will review pt with attending as pt will likely need to be scheduled for EUS with biopsy 2. Abnormal liver enzymes--likely due to CBD obstruction and liver metastasis.   Hvozdovic, Deloris Ping 04/02/2014  [facility] Attending  I have also seen and assessed the patient and agree with the above note.  He appears to have metastatic pancreatic cancer. Doubt bladder cancer is in pancreas and liver but possible. Chronically dilated bile ducts without jaundice  and without pruritis. Mild pain. He wants to pursue Tx of this problem once we know what it is. Looks like best way to get a dx would be another attempt at EUS and FNA - we need to get Dr. Ardis Hughs' opinion about that as if it is appropruiate - ? If can be done as inpatient or outpatient. Should know today.  Gatha Mayer, MD, [facility] Gastroenterology [phone] (pager) 04/02/2014 9:39 AM

## 2014-04-02 NOTE — Progress Notes (Signed)
   Some back and forth today over the exam - after several communications - will plan for EUS with FNA Thursday 9/17 - either inpatient or outpatient - he is waiting on SNF bed but if goes can come back for the EUS Discussed w/ patient and Dr. Rockne Menghini. POA in loop also through Dr. Rockne Menghini and our office  Gatha Mayer, MD, [facility] Gastroenterology [phone] (pager) 04/02/2014 11:39 AM

## 2014-04-02 NOTE — Progress Notes (Signed)
PIV removed due to "leaking" at insert when flushed. Patient does not want to have restarted tonight. Wants to talk to Dr. Rockne Menghini in am and "see if he still needs it".

## 2014-04-02 NOTE — Progress Notes (Signed)
Clinical Social Work Department BRIEF PSYCHOSOCIAL ASSESSMENT 04/02/2014  Patient:  [name] [name], [name] [name]     Account Number:  [id number]     Admit date:  03/29/2014  Clinical Social Worker:  [name] [name]  Date/Time:  04/02/2014 02:30 PM  Referred by:  Physician  Date Referred:  04/02/2014 Referred for  ALF Placement   Other Referral:   Interview type:  Patient Other interview type:    PSYCHOSOCIAL DATA Living Status:  ALONE Admitted from facility:   Level of care:   Primary support name:  [name] [name] Primary support relationship to patient:  FRIEND Degree of support available:   Adequate    CURRENT CONCERNS Current Concerns  Post-Acute Placement   Other Concerns:    SOCIAL WORK ASSESSMENT / PLAN CSW received referral in order to assist with DC planning. CSW reviewed chart and met with patient at bedside. CSW introduced myself and explained role.    Patient reports that he lived at home alone prior to admission but had been working with [facility] to move into independent living. Patient is unable to give any specific details and asked CSW to contact his POA [name] [name]) who was assisting with process.    CSW spoke with POA who reports that patient had already completed information and was supposed to move into independent living apartments soon. POA reports she recently spoke with [name] [name] at [facility] but agreeable for CSW to follow up as well to determine if they can accept patient.    CSW spoke with [name] [name] at facility who reports she has been speaking with POA and her administrator but wanted updated information. CSW faxed clinicals and explained that PT is recommending no PT follow up. [name] [name] reports concern that if patient declines then he might need SNF and only [facility] has skilled beds at this time. [name] [name] reports she will review information and call CSW back with determination.    POA reports she would also be interested in [facility]. CSW  called and left a message with admissions to determine if they have any available space. CSW will continue to follow.   Assessment/plan status:  Psychosocial Support/Ongoing Assessment of Needs Other assessment/ plan:   Information/referral to community resources:   ALF information    PATIENT'S/FAMILY'S RESPONSE TO PLAN OF CARE: Patient alert and oriented but minimally engaged because he reports that POA handles his affairs. Patient's POA appreciative of CSW call and reports that she is worried about patient returning home because he has no family present and was weak at home prior to admission. POA reports she is hopeful that patient can transfer directly to ALF and feels that [facility] would be best fit for patient. POA agreeable for CSW to contact facilities re: placement.       [name] Messing, LCSW (Coverage for [employer])

## 2014-04-02 NOTE — Telephone Encounter (Signed)
[name] [name] given the instructions for the patient to be given to him at his hospital discharge.

## 2014-04-02 NOTE — Plan of Care (Signed)
Problem: Discharge Progression Outcomes Goal: Tolerating diet Outcome: Progressing Nutritionist seen 9/13

## 2014-04-02 NOTE — Plan of Care (Signed)
Problem: Phase II Progression Outcomes Goal: Progress activity as tolerated unless otherwise ordered Outcome: Progressing oob with supervision using walker

## 2014-04-02 NOTE — Telephone Encounter (Signed)
Message copied by Barron Alvine on Mon Apr 02, 2014 10:14 AM ------      Message from: Owens Loffler P      Created: Mon Apr 02, 2014 10:06 AM       Just read that IR doesn't think they can get at the liver lesion.            He was very frail appearing at time of EUS 2-3 months ago (had fallen a few days before with several lacerations, abrasions on face), didn't seem to understand much about his health.  I haven't seen him since then, though, and I read  that he wants everything done.             I can plan for EUS this Thursday.  I'll forward this to Schae Cando as well.             thanks                   Emanuele Mcwhirter,Can you work with Cecille Rubin?  Needs upper EUS, radial +/- linear, ++MAC for pancreatic mass.  This Thursday. Thanks       ------

## 2014-04-02 NOTE — Progress Notes (Addendum)
Clinical Social Work Department CLINICAL SOCIAL WORK PLACEMENT NOTE 04/02/2014  Patient:  [name] [name], [name] [name]  Account Number:  [id number] Admit date:  03/29/2014  Clinical Social Worker:  Sindy Messing, LCSW  Date/time:  04/02/2014 02:30 PM  Clinical Social Work is seeking post-discharge placement for this patient at the following level of care:   ASSISTED LIVING/REST HOME   (*CSW will update this form in Epic as items are completed)   04/02/2014  Patient/family provided with [place] Department of Clinical Social Work's list of facilities offering this level of care within the geographic area requested by the patient (or if unable, by the patient's family).  04/02/2014  Patient/family informed of their freedom to choose among providers that offer the needed level of care, that participate in Medicare, Medicaid or managed care program needed by the patient, have an available bed and are willing to accept the patient.  04/02/2014  Patient/family informed of [facility]' ownership interest in [facility], as well as of the fact that they are under no obligation to receive care at this facility.  PASARR submitted to EDS on  04/03/2014 PASARR number received on 04/03/2014  FL2 transmitted to all facilities in geographic area requested by pt/family on  04/02/2014 FL2 transmitted to all facilities within larger geographic area on   Patient informed that his/her managed care company has contracts with or will negotiate with  certain facilities, including the following:     Patient/family informed of bed offers received:  04/03/2014 Patient chooses bed at [facility] Physician recommends and patient chooses bed at    Patient to be transferred to  on  [place] on 04/04/2014 Patient to be transferred to facility by pt friend, [name] [name] Patient and family notified of transfer on 04/03/2014 Name of family member notified:  Pt notified at bedside. Pt POA, [name] [name]  notified via telephone.   The following physician request were entered in Epic:   Additional Comments:

## 2014-04-03 DIAGNOSIS — Z8546 Personal history of malignant neoplasm of prostate: Secondary | ICD-10-CM

## 2014-04-03 DIAGNOSIS — R300 Dysuria: Secondary | ICD-10-CM | POA: Diagnosis not present

## 2014-04-03 LAB — GLUCOSE, CAPILLARY
Glucose-Capillary: 170 mg/dL — ABNORMAL HIGH (ref 70–99)
Glucose-Capillary: 187 mg/dL — ABNORMAL HIGH (ref 70–99)
Glucose-Capillary: 199 mg/dL — ABNORMAL HIGH (ref 70–99)
Glucose-Capillary: 154 mg/dL — ABNORMAL HIGH (ref 70–99)

## 2014-04-03 MED ORDER — BISACODYL 10 MG RE SUPP: 10.0000 mg | Freq: Every day | RECTAL | Status: AC | PRN

## 2014-04-03 MED ORDER — INSULIN ASPART 100 UNIT/ML ~~LOC~~ SOLN: 0.0000 [IU] | Freq: Three times a day (TID) | SUBCUTANEOUS | Status: DC

## 2014-04-03 MED ORDER — DSS 100 MG PO CAPS: 100.0000 mg | ORAL_CAPSULE | Freq: Two times a day (BID) | ORAL | Status: AC

## 2014-04-03 MED ORDER — INSULIN ASPART 100 UNIT/ML ~~LOC~~ SOLN
3.0000 [IU] | Freq: Three times a day (TID) | SUBCUTANEOUS | Status: DC
  Administered 2014-04-03 – 2014-04-04 (×4): 3 [IU] via SUBCUTANEOUS

## 2014-04-03 MED ORDER — INSULIN ASPART 100 UNIT/ML ~~LOC~~ SOLN: 0.0000 [IU] | Freq: Every day | SUBCUTANEOUS | Status: DC

## 2014-04-03 MED ORDER — INSULIN ASPART 100 UNIT/ML ~~LOC~~ SOLN
0.0000 [IU] | Freq: Three times a day (TID) | SUBCUTANEOUS | Status: DC
  Administered 2014-04-03 – 2014-04-04 (×3): 2 [IU] via SUBCUTANEOUS

## 2014-04-03 MED ORDER — GLUCERNA SHAKE PO LIQD: 237.0000 mL | ORAL | Status: DC

## 2014-04-03 MED ORDER — HYDROCODONE-ACETAMINOPHEN 5-325 MG PO TABS: 1.0000 | ORAL_TABLET | ORAL | Status: DC | PRN

## 2014-04-03 MED ORDER — INSULIN ASPART 100 UNIT/ML ~~LOC~~ SOLN: 3.0000 [IU] | Freq: Three times a day (TID) | SUBCUTANEOUS | Status: DC

## 2014-04-03 NOTE — Discharge Summary (Signed)
Physician Discharge Summary  CHIP CANEPA OZH:[id number] DOB: 1922-12-14 DOA: 03/29/2014  PCP: Drema Pry, DO  Admit date: 03/29/2014 Discharge date: 04/04/2014   Recommendations for Outpatient Follow-Up:   1. F/U with Dr. Ardis Hughs for EUS/biopsy scheduled for 04/05/14 at [facility]. 2. F/U repeat urine culture.  Culture done 03/30/14 negative. 3. Lasix continues to be on hold, monitor for signs of decompensated heart failure and resume when clinically indicated.   Discharge Diagnosis:   Principal Problem:    Pancreatic mass Active Problems:    DM    HYPERTENSION, BENIGN    CARDIOMYOPATHY    SYSTOLIC HEART FAILURE, CHRONIC    Bladder cancer    Metastatic cancer    Hyponatremia    Failure to thrive in adult    Frailty    Malnutrition of moderate degree    Transaminitis    Normocytic anemia    Liver metastasis    Palliative care encounter    Weakness generalized    Dysuria   Discharge Condition: Stable.  Diet recommendation: Low sodium, heart healthy.  Carbohydrate-modified.    History of Present Illness:   JAIQUAN TEMME is an 80 y.o. male with a PMH of DVT, chronic systolic heart failure/cardiomyopathy, last echocardiogram done in 2012 with EF of 25%, bladder cancer status post TURBT with chemotherapy instillation, status post cystoscopy with bilateral retrograde pyelogram and bladder biopsy/renal washing on 03/09/14, pancreatic mass incidentally discovered on imaging (declined further evaluation with endoscopic ultrasound/biopsy given multiple comorbidities and frailty) who was admitted 03/29/14 with chief complaint of malaise, decreased appetite, generalized weakness and hematuria. A CT scan done on admission showed a 4.9 cm enhancing lesion adjacent to the pancreatic head/uncinate process worrisome for a primary pancreatic adenocarcinoma and suspected multifocal hepatic metastasis. This mass was noted back in June when his urologist ordered a CT scan of his  abdomen/pelvis, and he was referred to GI as an outpatient for further evaluation. An EUS with biopsy was planned however the patient did not hold his Coumadin as instructed and the biopsy was canceled. The mass was not detectable on the ultrasound. Further evaluation was subsequently halted given the patient's overall functional status and comorbidities. The patient has poor insight into his illness and has clear cognitive impairment (likely does not grasp the severity of his illness or appreciate treatment considerations). He has no family to provide support, although he does have close friends that are quite supportive. His wife died 3 weeks ago. Patient was seen and evaluated by the palliative care team and wishes to pursue a diagnosis and treatment if he is candidate.    Hospital Course by Problem:   Principal Problem:  Pancreatic mass suspicious for primary pancreatic malignancy with liver metastasis in the setting of generalized weakness, failure to thrive, and frailty  The patient likely has cancer related cachexia with diminished appetite, malaise, and progressive weakness related to an advanced malignancy with evidence of liver metastasis.  Seen by palliative care team. The patient wants aggressive treatment/full CODE STATUS at this time.  IR consulted for consideration of CT-guided biopsy, MRI of the abdomen shows a 4.4 X3 0.2 cm pancreatic head mass with vascular involvement concerning for primary pancreatic neoplasm, and multiple ill-defined hepatic lesions concerning for metastatic disease. He has severe intra-and extrahepatic biliary ductal dilatation with mild elevation of LFTs. Unfortunately IR unable to safely do transcutaneous biopsy.  GI consulted for consideration of EUS with biopsy per patient wishes to pursue a diagnosis with aggressive treatment. Procedure planned for  04/05/14. Can be done inpatient/outpatient. Medically stable for D/C and has a bed at an ALF 04/04/14. CA-19-9  rising which is consistent with likely pancreatic cancer.  Active Problems:  Dysuria  Outpatient urine culture grew enterococcus, but followup culture done 03/30/14 negative.  Repeat urine culture. The patient did receive 3 days of therapy with Rocephin empirically on admission.  Transaminitis  Likely from obstruction of the distal common bile duct secondary to liver metastasis.  For endoscopic ultrasound 04/05/14. May need stent.  Normocytic anemia  Likely multifactorial with hematuria in the setting of recent treatment with Coumadin, anemia of chronic disease secondary to advanced malignancy contributory.  No current indication for transfusion.  Moderate protein calorie malnutrition  Pt met criteria for moderate MALNUTRITION in the context of chronic illness as evidenced by PO intake < 75% for one month, 8% body weight loss in 3 months.  Supplements per dietitian recommendations ordered.   Hypokalemia  Supplemented orally.  DM  Appears to be diet controlled. Not on oral hypoglycemics or insulin therapy at home.  Currently on SSI, insulin sensitive scale. CBGs 170-219. Will add meal coverage.  HYPERTENSION, BENIGN  Blood pressure controlled on Coreg.  CARDIOMYOPATHY/SYSTOLIC HEART FAILURE, CHRONIC  Appears compensated. Continue Coreg. Lasix currently on hold secondary to dehydration.  Bladder cancer  Originally diagnosed 09/2012: Pathology showing low-grade papillary urothelial cancer. Status post TURBT with instillation of mitomycin 09/30/12. He has had persistent positive cytology on subsequent cystoscopies.  Status post cystoscopy with bilateral retrograde pyelogram, bladder biopsy and renal washings 03/09/14.  Biopsies negative for malignancy.  Hyponatremia  Likely secondary to dehydration. Resolved with IV fluids.   Medical Consultants:    Irena Reichmann, NP, Palliative Care   Dr. Vernard Gambles, IR Dr.   Silvano Rusk, Gastroenterology  Discharge Exam:   Filed Vitals:    04/03/14 1325  BP: 113/50  Pulse: 94  Temp: 98.5 F (36.9 C)  Resp: 16   Filed Vitals:   04/02/14 1351 04/02/14 2051 04/03/14 0456 04/03/14 1325  BP: 106/49 103/50 111/61 113/50  Pulse: 52 60 56 94  Temp: 98.3 F (36.8 C) 98 F (36.7 C) 97.9 F (36.6 C) 98.5 F (36.9 C)  TempSrc: Oral Oral Oral Oral  Resp: _0 Height:      Weight:      SpO2: 96% 96% 93% 100%    Gen:  NAD Cardiovascular:  RRR, No M/R/G Respiratory: Lungs CTAB Gastrointestinal: Abdomen soft, NT/ND with normal active bowel sounds. Extremities: No C/E/C   The results of significant diagnostics from this hospitalization (including imaging, microbiology, ancillary and laboratory) are listed below for reference.     Procedures and Diagnostic Studies:   Ct Abdomen Pelvis W Contrast 03/29/2014: 4.9 cm enhancing lesion adjacent to the pancreatic head/uncinate process, worrisome for primary pancreatic adenocarcinoma, progressed. Tumor likely involves the SMA/SMV and undersurface of the portal vein. 9 mm short axis node in the porta hepatitis. Suspected multifocal hepatic metastases, including a 3.2 cm lesion in the right hepatic dome. Additional ancillary findings as above.   Dg Chest 2 View 03/30/2014 : 1. Mild cardiomegaly with mild pulmonary vascular prominence. No evidence of overt pulmonary edema. 2. Mild bibasilar atelectasis.   Mr Abdomen W Wo Contrast 03/31/2014: 1. Subtle amorphous mass in the pancreatic head and proximal pancreatic body currently measuring 4.4 x 3.2 cm, causing distal common bile duct obstruction and demonstrating vascular involvement (SMA, SMV, splenic vein, splenic portal confluence and proximal portal vein), as above, highly concerning for primary  pancreatic neoplasm. 2. Although there do appear to be multiple ill-defined hepatic lesions, concerning for metastatic disease, these were poorly evaluated on today's examination secondary to patient respiratory motion. Continued attention on  any future followup studies is recommended, as metastatic disease to the liver is suspected. 3. Severe intra and extrahepatic biliary ductal dilatation.   Labs:   Basic Metabolic Panel:  Recent Labs Lab 03/29/14 1610 03/30/14 0424 03/31/14 0421  NA 132* 137 135*  K 3.9 3.5* 3.8  CL 94* 100 99  CO2 _0 GLUCOSE 160* 130* 137*  BUN _1 CREATININE 1.02 0.91 0.91  CALCIUM 9.2 8.6 8.8   GFR Estimated Creatinine Clearance: 57.9 ml/min (by C-G formula based on Cr of 0.91). Liver Function Tests:  Recent Labs Lab 03/29/14 1610 03/30/14 0424  AST 130* 63*  ALT 181* 118*  ALKPHOS 480* 344*  BILITOT 1.0 0.7  PROT 7.1 5.7*  ALBUMIN 3.4* 2.8*    Recent Labs Lab 03/29/14 1610  LIPASE 10*   Coagulation profile  Recent Labs Lab 03/29/14 1940  INR 1.40    CBC:  Recent Labs Lab 03/29/14 1610 03/30/14 0424 03/31/14 0421  WBC 5.0 3.6* 4.5  NEUTROABS 4.1  --   --   HGB 10.5* 9.4* 9.9*  HCT 30.7* 27.9* 29.3*  MCV 92.5 93.0 95.1  PLT 194 170 169   CBG:  Recent Labs Lab 04/02/14 1717 04/02/14 2134 04/03/14 0714 04/03/14 1149 04/03/14 1647  GLUCAP 186* 201* 170* 187* 199*   Microbiology Recent Results (from the past 240 hour(s))  URINE CULTURE     Status: None   Collection Time    03/30/14  4:37 AM      Result Value Ref Range Status   Specimen Description URINE, CLEAN CATCH   Final   Special Requests NONE   Final   Culture  Setup Time     Final   Value: 03/30/2014 09:36     Performed at [place]     Final   Value: NO GROWTH     Performed at [facility]   Culture     Final   Value: NO GROWTH     Performed at [facility]   Report Status 03/31/2014 FINAL   Final     Discharge Instructions:   Discharge Instructions   Call MD for:  extreme fatigue    Complete by:  As directed      Call MD for:  persistant nausea and vomiting    Complete by:  As directed      Call MD for:  severe uncontrolled  pain    Complete by:  As directed      Diet - low sodium heart healthy    Complete by:  As directed      Diet Carb Modified    Complete by:  As directed      Increase activity slowly    Complete by:  As directed      Walk with assistance    Complete by:  As directed      Walker     Complete by:  As directed             Medication List    STOP taking these medications       furosemide 40 MG tablet  Commonly known as:  LASIX      TAKE these medications       bisacodyl 10  MG suppository  Commonly known as:  DULCOLAX  Place 1 suppository (10 mg total) rectally daily as needed for moderate constipation.     carvedilol 12.5 MG tablet  Commonly known as:  COREG  Take 12.5 mg by mouth 2 (two) times daily with a meal.     DSS 100 MG Caps  Take 100 mg by mouth 2 (two) times daily.     feeding supplement (GLUCERNA SHAKE) Liqd  Take 237 mLs by mouth daily.     fish oil-omega-3 fatty acids 1000 MG capsule  Take 1 g by mouth daily.     HYDROcodone-acetaminophen 5-325 MG per tablet  Commonly known as:  NORCO/VICODIN  Take 1-2 tablets by mouth every 4 (four) hours as needed for moderate pain.     insulin aspart 100 UNIT/ML injection  Commonly known as:  novoLOG  Inject 0-5 Units into the skin at bedtime.     insulin aspart 100 UNIT/ML injection  Commonly known as:  novoLOG  Inject 0-9 Units into the skin 3 (three) times daily with meals.     insulin aspart 100 UNIT/ML injection  Commonly known as:  novoLOG  Inject 3 Units into the skin 3 (three) times daily with meals.     nitroGLYCERIN 0.4 MG SL tablet  Commonly known as:  NITROSTAT  Place 1 tablet (0.4 mg total) under the tongue every 5 (five) minutes as needed for chest pain.     polyethylene glycol packet  Commonly known as:  MIRALAX / GLYCOLAX  Take 17 g by mouth 2 (two) times daily as needed for mild constipation.     pravastatin 40 MG tablet  Commonly known as:  PRAVACHOL  Take 40 mg by mouth every evening.      pregabalin 75 MG capsule  Commonly known as:  LYRICA  Take 75 mg by mouth daily.     ranitidine 150 MG tablet  Commonly known as:  ZANTAC  Take 150 mg by mouth 2 (two) times daily.     zolpidem 10 MG tablet  Commonly known as:  AMBIEN  Take 10 mg by mouth at bedtime as needed for sleep.           Follow-up Information   Follow up with Drema Pry, DO. Schedule an appointment as soon as possible for a visit in 1 week. [facility] follow up.)    Specialty:  Internal Medicine   Contact information:   [place] [address] [phone]       Follow up with [name], MD. (04/05/14 for endoscopic ultrasound/biopsy)    Specialty:  Gastroenterology   Contact information:   [address] [place] [address] [phone]        Time coordinating discharge: 35 minutes.  Signed:  Pebble Botkin  Pager [phone] [facility] 04/03/2014, 4:52 PM

## 2014-04-03 NOTE — Progress Notes (Signed)
Tuberculin intradermal placed Right anterior forearm. Site circled for identification.

## 2014-04-03 NOTE — Progress Notes (Signed)
Physical Therapy Treatment and Discharge from Acute PT Patient Details Name: [name] [name] MRN: [id number] DOB: Jul 27, 1922 Today's Date: May 02, 2014    History of Present Illness 80 y.o. male with h/o B TKA, bladder cancer, DVT, chronic systolic heart failure/cardiomyopathy, fall with open nasal bone fx in June admitted for pancreatic mass suspicious for primary pancreatic malignancy with liver metastasis.    PT Comments    Pt ambulating well with RW.  Pt has met acute PT goals so will sign off.  Pt encouraged to continue ambulating with staff during hospitalization as well as use RW for safety.  Plans per chart for pt to d/c to ALF.    Follow Up Recommendations  No PT follow up;Supervision for mobility/OOB     Equipment Recommendations  None recommended by PT    Recommendations for Other Services       Precautions / Restrictions Precautions Precautions: Fall    Mobility  Bed Mobility               General bed mobility comments: pt up in recliner on arrival  Transfers Overall transfer level: Modified independent Equipment used: Rolling walker (2 wheeled) Transfers: Sit to/from Stand Sit to Stand: Modified independent (Device/Increase time)            Ambulation/Gait Ambulation/Gait assistance: Supervision Ambulation Distance (Feet): 500 Feet Assistive device: Rolling walker (2 wheeled) Gait Pattern/deviations: Step-through pattern;Trunk flexed     General Gait Details: steady gait with RW, no LOB observed   Stairs            Wheelchair Mobility    Modified Rankin (Stroke Patients Only)       Balance                                    Cognition Arousal/Alertness: Awake/alert Behavior During Therapy: WFL for tasks assessed/performed Overall Cognitive Status: Within Functional Limits for tasks assessed                      Exercises      General Comments        Pertinent Vitals/Pain Pain Assessment: No/denies  pain    Home Living                      Prior Function            PT Goals (current goals can now be found in the care plan section) Progress towards PT goals: Goals met/education completed, patient discharged from PT    Frequency       PT Plan  (d/c from acute PT)    Co-evaluation             End of Session   Activity Tolerance: Patient tolerated treatment well Patient left: in chair;with call bell/phone within reach     Time: 1445-1456 PT Time Calculation (min): 11 min  Charges:  $Gait Training: 8-22 mins                    G Codes:      Gelena Klosinski,KATHrine E 02-May-2014, 4:11 PM Carmelia Bake, PT, DPT 2014-05-02 Pager: [phone]

## 2014-04-03 NOTE — Care Management Note (Signed)
CARE MANAGEMENT NOTE 04/03/2014  Patient:  [name] [name], [name] [name]   Account Number:  [id number]  Date Initiated:  03/30/2014  Documentation initiated by:  Leafy Kindle  Subjective/Objective Assessment:   80 yo admitted with pancreatic mass.  S/P turbt with chemo instillation, S/P cystoscopy with bilateral retrograde pyelogram bladder bx and renal washing on 03-09-2014,     Action/Plan:   From home alone   Anticipated DC Date:  04/03/2014   Anticipated DC Plan:  ASSISTED LIVING / [place] referral  Clinical Social Worker      [place]  CM consult      Choice offered to / List presented to:             Status of service:  In process, will continue to follow Medicare Important Message given?  YES (If response is "NO", the following Medicare IM given date fields will be blank) Date Medicare IM given:  04/03/2014 Medicare IM given by:  Leafy Kindle Date Additional Medicare IM given:   Additional Medicare IM given by:    Discharge Disposition:    Per UR Regulation:  Reviewed for med. necessity/level of care/duration of stay  If discussed at [place] of Stay Meetings, dates discussed:   04/03/2014    Comments:  04/03/14 Marney Doctor RN,BSN,NCM [phone] Pt awaiting ALF placement.  03/30/14 Marney Doctor RN,BSN,NCM Pt from home alone.  CM will follow for dc needs.

## 2014-04-03 NOTE — Progress Notes (Signed)
Progress Note   [name] [name] MLY:[id number] DOB: 12/08/1922 DOA: 03/29/2014 PCP: Drema Pry, DO   Brief Narrative:   [name] [name] is an 80 y.o. male with a PMH of DVT, chronic systolic heart failure/cardiomyopathy, last echocardiogram done in 2012 with EF of 25%, bladder cancer status post TURBT with chemotherapy instillation, status post cystoscopy with bilateral retrograde pyelogram and bladder biopsy/renal washing on 03/09/14, pancreatic mass incidentally discovered on imaging (declined further evaluation with endoscopic ultrasound/biopsy given multiple comorbidities and frailty) who was admitted 03/29/14 with chief complaint of malaise, decreased appetite, generalized weakness and hematuria. A CT scan done on admission showed a 4.9 cm enhancing lesion adjacent to the pancreatic head/uncinate process worrisome for a primary pancreatic adenocarcinoma and suspected multifocal hepatic metastasis. This mass was noted back in June when his urologist ordered a CT scan of his abdomen/pelvis, and he was referred to GI as an outpatient for further evaluation. An EUS with biopsy was planned however the patient did not hold his Coumadin as instructed and the biopsy was canceled. The mass was not detectable on the ultrasound. Further evaluation was subsequently halted given the patient's overall functional status and comorbidities. The patient has poor insight into his illness and has clear cognitive impairment (likely does not grasp the severity of his illness or appreciate treatment considerations). He has no family to provide support, although he does have close friends that are quite supportive. His wife died 3 weeks ago. Patient was seen and evaluated by the palliative care team and wishes to pursue a diagnosis and treatment if he is candidate.  Assessment/Plan:   Principal Problem:   Pancreatic mass suspicious for primary pancreatic malignancy with liver metastasis in the setting of generalized  weakness, failure to thrive, and frailty  The patient likely has cancer related cachexia with diminished appetite, malaise, and progressive weakness related to an advanced malignancy with evidence of liver metastasis.  Seen by palliative care team. The patient wants aggressive treatment/full CODE STATUS at this time.  IR consulted for consideration of CT-guided biopsy, MRI of the abdomen shows a 4.4 X3 0.2 cm pancreatic head mass with vascular involvement concerning for primary pancreatic neoplasm, and multiple ill-defined hepatic lesions concerning for metastatic disease. He has severe intra-and extrahepatic biliary ductal dilatation with mild elevation of LFTs.  Unfortunately IR unable to safely do transcutaneous biopsy.    GI consulted for consideration of EUS with biopsy per patient wishes to pursue a diagnosis with aggressive treatment. Procedure planned for 04/05/14. Can be done inpatient/outpatient.  CA-19-9 rising which is consistent with likely pancreatic cancer.  Active Problems:   Dysuria  Outpatient urine culture grew enterococcus, but followup culture done 03/30/14 negative.  Repeat urine culture. The patient did receive 3 days of therapy with Rocephin empirically on admission.    Transaminitis  Likely from obstruction of the distal common bile duct secondary to liver metastasis.  For endoscopic ultrasound 04/05/14. May need stent.  Normocytic anemia  Likely multifactorial with hematuria in the setting of recent treatment with Coumadin, anemia of chronic disease secondary to advanced malignancy contributory.  No current indication for transfusion.    Moderate protein calorie malnutrition  Pt meets criteria for moderate MALNUTRITION in the context of chronic illness as evidenced by PO intake < 75% for one month, 8% body weight loss in 3 months.  Supplements per dietitian recommendations ordered. Diet liberalized.    Hypokalemia  Supplemented orally.    DM  Appears  to be diet controlled.  Not on oral hypoglycemics or insulin therapy at home.  Currently on SSI, insulin sensitive scale. CBGs 170-219. Will add meal coverage.    HYPERTENSION, BENIGN  Blood pressure controlled on Coreg.    CARDIOMYOPATHY/SYSTOLIC HEART FAILURE, CHRONIC  Appears compensated. Continue Coreg. Lasix currently on hold secondary to dehydration.    Bladder cancer  Originally diagnosed 09/2012: Pathology showing low-grade papillary urothelial cancer. Status post TURBT with instillation of mitomycin 09/30/12. He has had persistent positive cytology on subsequent cystoscopies.  Status post cystoscopy with bilateral retrograde pyelogram, bladder biopsy and renal washings 03/09/14.  Biopsies negative for malignancy.    Hyponatremia  Likely secondary to dehydration. Resolved with IV fluids.    DVT Prophylaxis  Continue SCDs.  Code Status: Full. Family Communication: POA Jeani Hawking [phone]) lives in [place] and was updated and appraised of plan for endoscopic ultrasound 04/05/14. She also understands that the patient may be discharged to an ALF and that the procedure may be done outpatient. Disposition Plan: ALF when stable.   IV Access:    Peripheral IV   Procedures and diagnostic studies:   Ct Abdomen Pelvis W Contrast 03/29/2014: 4.9 cm enhancing lesion adjacent to the pancreatic head/uncinate process, worrisome for primary pancreatic adenocarcinoma, progressed.  Tumor likely involves the SMA/SMV and undersurface of the portal vein. 9 mm short axis node in the porta hepatitis.  Suspected multifocal hepatic metastases, including a 3.2 cm lesion in the right hepatic dome.  Additional ancillary findings as above.     Dg Chest 2 View 03/30/2014 : 1. Mild cardiomegaly with mild pulmonary vascular prominence. No evidence of overt pulmonary edema. 2. Mild bibasilar atelectasis.     Mr Abdomen W Wo Contrast 03/31/2014: 1. Subtle amorphous mass in the pancreatic head and  proximal pancreatic body currently measuring 4.4 x 3.2 cm, causing distal common bile duct obstruction and demonstrating vascular involvement (SMA, SMV, splenic vein, splenic portal confluence and proximal portal vein), as above, highly concerning for primary pancreatic neoplasm. 2. Although there do appear to be multiple ill-defined hepatic lesions, concerning for metastatic disease, these were poorly evaluated on today's examination secondary to patient respiratory motion. Continued attention on any future followup studies is recommended, as metastatic disease to the liver is suspected. 3. Severe intra and extrahepatic biliary ductal dilatation.     Medical Consultants:    Irena Reichmann, NP, Palliative Care  Dr. Vernard Gambles, IR  Dr. Silvano Rusk, Gastroenterology  Other Consultants:    Physical therapy: No PT followup, supervision for mobility/00B.   Anti-Infectives:    Rocephin 03/29/14---> 04/01/14  Subjective:   Jacquelyne Balint is sitting up in a chair without complaints although his POA reports that he's been complaining of some dysuria and wanted a catheter placed. He continues to have some abdominal discomfort. He has moved his bowels. No nausea or vomiting.  Objective:    Filed Vitals:   04/02/14 0628 04/02/14 1351 04/02/14 2051 04/03/14 0456  BP: 127/51 106/49 103/50 111/61  Pulse: 81 52 60 56  Temp: 98.5 F (36.9 C) 98.3 F (36.8 C) 98 F (36.7 C) 97.9 F (36.6 C)  TempSrc: Oral Oral Oral Oral  Resp: 20 16 16 16   Height:      Weight:      SpO2: 98% 96% 96% 93%    Intake/Output Summary (Last 24 hours) at 04/03/14 0759 Last data filed at 04/03/14 0656  Gross per 24 hour  Intake    720 ml  Output   2450 ml  Net  -  1730 ml    Exam: Gen:  NAD Neuro: Non-focal, but memory impaired Cardiovascular:  RRR, No M/R/G Respiratory:  Lungs CTAB Gastrointestinal:  Abdomen soft, NT/ND, + BS Extremities:  No C/E/C   Data Reviewed:    Labs:  Basic Metabolic  Panel:  Recent Labs Lab 03/29/14 1610 03/30/14 0424 03/31/14 0421  NA 132* 137 135*  K 3.9 3.5* 3.8  CL 94* 100 99  CO2 25 26 27   GLUCOSE 160* 130* 137*  BUN 20 17 14   CREATININE 1.02 0.91 0.91  CALCIUM 9.2 8.6 8.8   GFR Estimated Creatinine Clearance: 57.9 ml/min (by C-G formula based on Cr of 0.91). Liver Function Tests:  Recent Labs Lab 03/29/14 1610 03/30/14 0424  AST 130* 63*  ALT 181* 118*  ALKPHOS 480* 344*  BILITOT 1.0 0.7  PROT 7.1 5.7*  ALBUMIN 3.4* 2.8*    Recent Labs Lab 03/29/14 1610  LIPASE 10*   Coagulation profile  Recent Labs Lab 03/29/14 1940  INR 1.40    CBC:  Recent Labs Lab 03/29/14 1610 03/30/14 0424 03/31/14 0421  WBC 5.0 3.6* 4.5  NEUTROABS 4.1  --   --   HGB 10.5* 9.4* 9.9*  HCT 30.7* 27.9* 29.3*  MCV 92.5 93.0 95.1  PLT 194 170 169   CBG:  Recent Labs Lab 04/02/14 0755 04/02/14 1221 04/02/14 1717 04/02/14 2134 04/03/14 0714  GLUCAP 185* 219* 186* 201* 170*   Microbiology Recent Results (from the past 240 hour(s))  URINE CULTURE     Status: None   Collection Time    03/30/14  4:37 AM      Result Value Ref Range Status   Specimen Description URINE, CLEAN CATCH   Final   Special Requests NONE   Final   Culture  Setup Time     Final   Value: 03/30/2014 09:36     Performed at [facility]     Final   Value: NO GROWTH     Performed at [facility]   Culture     Final   Value: NO GROWTH     Performed at [facility]   Report Status 03/31/2014 FINAL   Final      Medications:   . carvedilol  12.5 mg Oral BID WC  . docusate sodium  100 mg Oral BID  . famotidine  20 mg Oral Daily  . feeding supplement (GLUCERNA SHAKE)  237 mL Oral Q24H  . insulin aspart  0-5 Units Subcutaneous QHS  . insulin aspart  0-9 Units Subcutaneous TID WC  . omega-3 acid ethyl esters  1 g Oral Daily  . pregabalin  75 mg Oral Daily  . sodium chloride  3 mL Intravenous Q12H  . tuberculin  5  Units Intradermal Once   Continuous Infusions:   Time spent: 25 minutes.    LOS: 5 days   [facility] Pager [phone]. If unable to reach me by pager, please call my cell phone at [phone].  *Please refer to [email or web address], password [email or web address] to get updated schedule on who will round on this patient, as hospitalists switch teams weekly. If 7PM-7AM, please contact night-coverage at [email or web address], password [email or web address] for any overnight needs.  04/03/2014, 7:59 AM

## 2014-04-03 NOTE — Progress Notes (Signed)
CSW continuing to follow.   CSW has been working diligently throughout today to develop a safe discharge plan as MD does not feel that pt is safe to return home.  CSW followed up with [facility] this morning regarding facility determination if facility can accept pt at a higher level of care than independent living. CSW updated [facility] with progress notes and clarified questions. CSW provided POA, [name] information to [facility] in order for facility to communicate with POA, [name] regarding pt finances as pt does not qualify for skilled need under Medicare.   CSW discussed with pt POA, [name] and clarified pt POA questions and concerns as pt POA was under the impression that pt would remain inpatient until his ultrasound on  Thursday. CSW notified MD that POA wanted to speak with MD regarding disposition as well.   CSW received notification that [facility] can accept pt at SNF level of care as private pay tomorrow morning 04/04/2014. CSW notified pt at bedside. CSW spoke with pt POA, [name] via telephone and all are in agreement to plan for transition to [facility] in the morning and pt POA arranging for pt friend to provide transportation and then pt POA can meet pt at [facility] later in the day.   CSW notified MD.  CSW to facilitate pt discharge needs tomorrow morning to [facility].  Alison Murray, MSW, [place] Work [phone]

## 2014-04-03 NOTE — Progress Notes (Signed)
This man was seen last week in my officeprior to his admission .At that time, urine culture was performed as he had mild pyuria.  His urine culture just returned growing enterococcus.  This is sensitive 2 ampicillin, levofloxacin, nitrofurantoin and vancomycin but resistant to tetracycline.  I have not treated him for his urinary tract infection.

## 2014-04-03 NOTE — Discharge Instructions (Signed)
Deconditioning Deconditioning refers to the changes in your body that occur during a period of inactivity. Deconditioning results in changes to your heart, lungs, and muscles. These changes decrease your ability to endure activity, resulting in feelings of fatigue and weakness. Deconditioning can occur after only a few days of bed rest or inactivity. The longer the period of inactivity, the more severe your symptoms of deconditioning will be. After longer periods of inactivity, it will also take longer for you to return to your previous level of functioning. Deconditioning can be mild, moderate, or severe:  Mild. Your condition interferes with your ability to perform your usual types of exercise, such as running, biking, or swimming.  Moderate. Your condition interferes with your ability to do normal everyday activities. This may include walking, grocery shopping, or doing chores or lawn work.  Severe. Your condition interferes with your ability to perform minimal activity or normal self-care. CAUSES  Some common reasons for inactivity that may result in deconditioning include:  Illnesses, such as cancer, stroke, heart attack, fibromyalgia, or chronic fatigue syndrome.  Injuries, especially back injuries, broken bones, or injury to ligaments or tendons.  Surgery or a long stay in the hospital for any reason.  Pregnancy, especially with conditions that require long periods of bed rest. RISK FACTORS Anything that results in a period of hospitalization or bed rest will put you at risk of deconditioning. Some other factors that can increase the risk include:  Obesity.  Poor nutrition.  Old age.  Injuries or illnesses that interfere with movement and activity. SIGNS AND SYMPTOMS  Feeling weak.  Feeling tired.  Shortness of breath with minor exertion.  Your heart beating faster than normal. You may or may not notice this without taking your pulse.  Pain or discomfort with  activity.  Decreased strength.  Decreased sense of balance.  Decreased endurance.  Difficulty participating in usual forms of exercise.  Difficulty doing activities of daily living, such as grocery shopping or chores.  Difficulty walking around the house and doing basic self-care, such as getting to the bathroom, preparing meals, or doing laundry. DIAGNOSIS  There is no specific test to diagnose deconditioning. Your health care provider will take your medical history and do a physical exam. During the physical exam, the health care provider will check for signs of deconditioning, such as:  Decreased size of muscles.  Decreased strength.  Difficulty with balance.  Shortness of breath or abnormally increased heart rate after minor exertion. TREATMENT  Treatment usually involves a structured exercise program in which activity is increased gradually. Your health care provider will determine which exercises are right for you. The exercise program will likely include aerobic exercise and strength training. Aerobic exercise helps improve the functioning of the heart and lungs as well as the muscles. Strength training helps improve muscle size and strength. Both of these types of exercise will improve your endurance. You may be referred to a physical therapist who can create a safe strengthening program for you to follow. HOME CARE INSTRUCTIONS  Follow the exercise program recommended by your health care provider or physical therapist.  Do not increase your exercise any faster than directed.  Eat a healthy diet.  If your health care provider thinks that you need to lose weight, consider seeing a dietitian to help you do so in a healthy way.  Do not use any tobacco products, including cigarettes, chewing tobacco, or electronic cigarettes. If you need help quitting, ask your health care provider.  Take  medicines only as directed by your health care provider.  Keep all follow-up visits as  directed by your health care provider. This is important. SEEK MEDICAL CARE IF:  You are not able to carry out the prescribed exercise program.  You are not able to carry out your usual level of activity.  You are having trouble doing normal household chores or caring for yourself.  You are becoming increasingly fatigued and weak.  You become light-headed when rising to a sitting or standing position.  Your level of endurance decreases after having improved. SEEK IMMEDIATE MEDICAL CARE IF:  You have chest pain.  You are very short of breath.  You have any episodes of passing out. Document Released: 11/20/2013 Document Reviewed: 07/11/2013 ExitCare Patient Information 2015 [employer]. This information is not intended to replace advice given to you by your health care provider. Make sure you discuss any questions you have with your health care provider.  

## 2014-04-04 ENCOUNTER — Non-Acute Institutional Stay (SKILLED_NURSING_FACILITY): Payer: Medicare Other | Admitting: Nurse Practitioner

## 2014-04-04 ENCOUNTER — Encounter: Payer: Self-pay | Admitting: Nurse Practitioner

## 2014-04-04 ENCOUNTER — Telehealth: Payer: Self-pay | Admitting: Gastroenterology

## 2014-04-04 DIAGNOSIS — E871 Hypo-osmolality and hyponatremia: Secondary | ICD-10-CM

## 2014-04-04 DIAGNOSIS — E876 Hypokalemia: Secondary | ICD-10-CM

## 2014-04-04 DIAGNOSIS — I10 Essential (primary) hypertension: Secondary | ICD-10-CM

## 2014-04-04 DIAGNOSIS — K5900 Constipation, unspecified: Secondary | ICD-10-CM

## 2014-04-04 DIAGNOSIS — R300 Dysuria: Secondary | ICD-10-CM

## 2014-04-04 DIAGNOSIS — G609 Hereditary and idiopathic neuropathy, unspecified: Secondary | ICD-10-CM

## 2014-04-04 DIAGNOSIS — R740 Nonspecific elevation of levels of transaminase and lactic acid dehydrogenase [LDH]: Secondary | ICD-10-CM

## 2014-04-04 DIAGNOSIS — I5022 Chronic systolic (congestive) heart failure: Secondary | ICD-10-CM

## 2014-04-04 DIAGNOSIS — E119 Type 2 diabetes mellitus without complications: Secondary | ICD-10-CM

## 2014-04-04 DIAGNOSIS — K219 Gastro-esophageal reflux disease without esophagitis: Secondary | ICD-10-CM

## 2014-04-04 DIAGNOSIS — K8689 Other specified diseases of pancreas: Secondary | ICD-10-CM

## 2014-04-04 DIAGNOSIS — G4700 Insomnia, unspecified: Secondary | ICD-10-CM

## 2014-04-04 DIAGNOSIS — R7402 Elevation of levels of lactic acid dehydrogenase (LDH): Secondary | ICD-10-CM

## 2014-04-04 DIAGNOSIS — D649 Anemia, unspecified: Secondary | ICD-10-CM

## 2014-04-04 DIAGNOSIS — K869 Disease of pancreas, unspecified: Secondary | ICD-10-CM

## 2014-04-04 DIAGNOSIS — E440 Moderate protein-calorie malnutrition: Secondary | ICD-10-CM

## 2014-04-04 DIAGNOSIS — R7401 Elevation of levels of liver transaminase levels: Secondary | ICD-10-CM

## 2014-04-04 HISTORY — DX: Insomnia, unspecified: G47.00

## 2014-04-04 HISTORY — DX: Constipation, unspecified: K59.00

## 2014-04-04 LAB — URINE CULTURE
COLONY COUNT: NO GROWTH
CULTURE: NO GROWTH

## 2014-04-04 LAB — GLUCOSE, CAPILLARY: Glucose-Capillary: 161 mg/dL — ABNORMAL HIGH (ref 70–99)

## 2014-04-04 NOTE — Progress Notes (Signed)
Nursing Discharge Summary  Patient ID: [name] [name] MRN: [id number] DOB/AGE: 19-Nov-1922 80 y.o.  Admit date: 03/29/2014 Discharge date: 04/04/2014  Discharged Condition: good  Disposition: To SNF. Transport by friend Rose.  Follow-up Information   Follow up with Drema Pry, DO. Schedule an appointment as soon as possible for a visit in 1 week. [facility] follow up.)    Specialty:  Internal Medicine   Contact information:   [place] [address] [phone]       Follow up with [name], MD. (04/05/14 for endoscopic ultrasound/biopsy)    Specialty:  Gastroenterology   Contact information:   [address] [place] [address] [phone]       Prescriptions Given: NA  Means of Discharge: Discharge to SNF via car with friend, Rose. All instructions received from endo passed onto [name] RN, [employer], during verbal report.  Signed: Juanetta Snow 04/04/2014, 10:39 AM

## 2014-04-04 NOTE — Assessment & Plan Note (Addendum)
Likely multifactorial with hematuria in the setting of recent treatment with Coumadin, anemia of chronic disease secondary to advanced malignancy contributory.  No current indication for transfusion. Hgb 9s. Will update CBC

## 2014-04-04 NOTE — Progress Notes (Signed)
Pt seen and examined at bedside. Vitals reviewed. Pt is medically stable for discharge. Leisa Lenz [facility] [phone]

## 2014-04-04 NOTE — Assessment & Plan Note (Addendum)
Originally diagnosed 09/2012: Pathology showing low-grade papillary urothelial cancer. Status post TURBT with instillation of mitomycin 09/30/12. He has had persistent positive cytology on subsequent cystoscopies.  Status post cystoscopy with bilateral retrograde pyelogram, bladder biopsy and renal washings 03/09/14.  Biopsies negative for malignancy.

## 2014-04-04 NOTE — Assessment & Plan Note (Signed)
Outpatient urine culture grew enterococcus, but followup culture done 03/30/14 negative.  Repeat urine culture. The patient did receive 3 days of therapy with Rocephin empirically on admission.

## 2014-04-04 NOTE — Assessment & Plan Note (Signed)
Pt met criteria for moderate MALNUTRITION in the context of chronic illness as evidenced by PO intake < 75% for one month, 8% body weight loss in 3 months.  Supplements per dietitian recommendations ordered

## 2014-04-04 NOTE — Anesthesia Preprocedure Evaluation (Signed)
Anesthesia Evaluation  Patient identified by MRN, date of birth, ID band Patient awake    Reviewed: Allergy & Precautions, H&P , NPO status , Patient's Chart, lab work & pertinent test results  History of Anesthesia Complications Negative for: history of anesthetic complications  Airway Mallampati: II TM Distance: >3 FB Neck ROM: Full    Dental no notable dental hx.    Pulmonary former smoker,  breath sounds clear to auscultation  Pulmonary exam normal       Cardiovascular hypertension, Pt. on medications + CAD, + Peripheral Vascular Disease and +CHF + dysrhythmias Rhythm:Regular Rate:Normal     Neuro/Psych negative neurological ROS  negative psych ROS   GI/Hepatic negative GI ROS, Neg liver ROS, GERD-  Medicated and Controlled,  Endo/Other  negative endocrine ROSdiabetes, Well Controlled  Renal/GU negative Renal ROS  negative genitourinary   Musculoskeletal negative musculoskeletal ROS (+) Arthritis -, Osteoarthritis,    Abdominal   Peds negative pediatric ROS (+)  Hematology negative hematology ROS (+) anemia ,   Anesthesia Other Findings   Reproductive/Obstetrics negative OB ROS                           Anesthesia Physical Anesthesia Plan  ASA: IV  Anesthesia Plan: MAC   Post-op Pain Management:    Induction: Intravenous  Airway Management Planned: Nasal Cannula  Additional Equipment:   Intra-op Plan:   Post-operative Plan: Extubation in OR  Informed Consent: I have reviewed the patients History and Physical, chart, labs and discussed the procedure including the risks, benefits and alternatives for the proposed anesthesia with the patient or authorized representative who has indicated his/her understanding and acceptance.   Dental advisory given  Plan Discussed with: CRNA  Anesthesia Plan Comments:         Anesthesia Quick Evaluation

## 2014-04-04 NOTE — Progress Notes (Signed)
04-04-14 1010 Spoke with [place], pt being discharged to [facility] today. Pt able to consent for self. Will have friend to drive. RN to relay instructions to pt to arrive by 1000 to admitting for procedure in Endoscopy with Dr. Ardis Hughs 04-05-14. Nothing by mouth or any tube feeding after midnight tonight, take only Coreg, with sip water. Use no insulin or Diabetic meds AM of procedure. Share this information on discharge instructions please to [facility]. Sharee Pimple in Endo [phone] made aware. W. Floy Sabina

## 2014-04-04 NOTE — Assessment & Plan Note (Signed)
Likely from obstruction of the distal common bile duct secondary to liver metastasis.  For endoscopic ultrasound 04/05/14. May need stent. Update LFT

## 2014-04-04 NOTE — Progress Notes (Signed)
Pt for discharge to [facility] where pt is being admitted to a private pay bed at facility.   CSW facilitated pt discharge needs including contacting [facility] administrator of nursing, Adah Salvage, faxing pt discharge information to facility, faxing pt instructions for preparation for endoscopy scheduled for tomorrow to facility and confirming facility received information and aware that preparation needs to be followed for procedure tomorrow. CSW discussed with pt at bedside and pt POA, [name] via telephone. Per POA, [name], pt friend, Kalman Shan will provide transportation for pt to [facility]F and provide transportation for pt to procedure tomorrow. CSW provided RN phone number to call report and communicated with pt friend, Kalman Shan regarding transportation to SNF this morning. Discharge packet provided at bedside in order for pt and pt friend, Kalman Shan to provide to facility once pt arrives.  Pt eager to transition to [facility] to get further care and appreciative of CSW support and assistance.  No further social work needs identified at this time.  CSW signing off.   Alison Murray, MSW, [place] Work [phone]

## 2014-04-04 NOTE — Assessment & Plan Note (Signed)
Takes Zolpidem 10mg  qhs prn for sleep.

## 2014-04-04 NOTE — Assessment & Plan Note (Signed)
Takes Pregabalin 75mg  daily.

## 2014-04-04 NOTE — Progress Notes (Signed)
Patient ID: [name] [name], male   DOB: 27-Aug-1922, 80 y.o.   MRN: [id number]   Code Status: Full code  No Known Allergies  Chief Complaint  Patient presents with  . Medical Management of Chronic Issues  . Hospitalization Follow-up    HPI: Patient is a 80 y.o. male seen in the SNF at [facility] today for evaluation of pancreatic  Mass, FTT, CHF,  and other chronic medical conditions.    Hospitalized 03/29/2014 -04/04/2014 for pancreatic mass, FTT, CHF. He was admitted 03/29/14 with chief complaint of malaise, decreased appetite, generalized weakness and hematuria. A CT scan done on admission showed a 4.9 cm enhancing lesion adjacent to the pancreatic head/uncinate process worrisome for a primary pancreatic adenocarcinoma and suspected multifocal hepatic metastasis. This mass was noted back in June when his urologist ordered a CT scan of his abdomen/pelvis, and he was referred to GI as an outpatient for further evaluation. An EUS with biopsy was planned however the patient did not hold his Coumadin as instructed and the biopsy was canceled. The mass was not detectable on the ultrasound. Further evaluation was subsequently halted given the patient's overall functional status and comorbidities. His wife died 3 weeks ago.    Problem List Items Addressed This Visit   Unspecified constipation     MiraLax bid prn and Colace 178m bid and Bisacodyl 176msuppository qd prn.     Transaminitis     Likely from obstruction of the distal common bile duct secondary to liver metastasis.  For endoscopic ultrasound 04/05/14. May need stent. Update LFT    SYSTOLIC HEART FAILURE, CHRONIC     Appears compensated. Continue Coreg. Lasix currently on hold secondary to dehydration.  chronic systolic heart failure/cardiomyopathy, last echocardiogram done in 2012 with EF of 25%,    PERIPHERAL NEUROPATHY     Takes Pregabalin 7537maily.     Pancreatic mass     he patient likely has cancer related cachexia  with diminished appetite, malaise, and progressive weakness related to an advanced malignancy with evidence of liver metastasis.  Seen by palliative care team. The patient wants aggressive treatment/full CODE STATUS at this time.  IR consulted for consideration of CT-guided biopsy, MRI of the abdomen shows a 4.4 X3 0.2 cm pancreatic head mass with vascular involvement concerning for primary pancreatic neoplasm, and multiple ill-defined hepatic lesions concerning for metastatic disease. He has severe intra-and extrahepatic biliary ductal dilatation with mild elevation of LFTs. Unfortunately IR unable to safely do transcutaneous biopsy.  GI consulted for consideration of EUS with biopsy per patient wishes to pursue a diagnosis with aggressive treatment. Procedure planned for 04/05/14. Can be done inpatient/outpatient. Medically stable for D/C and has a bed at an ALF 04/04/14.  CA-19-9 rising which is consistent with likely pancreatic cancer.      Normocytic anemia     Likely multifactorial with hematuria in the setting of recent treatment with Coumadin, anemia of chronic disease secondary to advanced malignancy contributory.  No current indication for transfusion. Hgb 9s. Will update CBC     Malnutrition of moderate degree     Pt met criteria for moderate MALNUTRITION in the context of chronic illness as evidenced by PO intake < 75% for one month, 8% body weight loss in 3 months.  Supplements per dietitian recommendations ordered    Insomnia     Takes Zolpidem 28m7ms prn for sleep.     Hyponatremia - Primary     Likely secondary to dehydration. Resolved  with IV fluids.    Hypokalemia     K 3.8 03/31/14. Update CMP    HYPERTENSION, BENIGN     Blood pressure controlled on Coreg.    GERD     Takes ranitidine 129m bid.     Dysuria     Outpatient urine culture grew enterococcus, but followup culture done 03/30/14 negative.  Repeat urine culture. The patient did receive 3 days of therapy  with Rocephin empirically on admission.    DM     Appears to be diet controlled. Not on oral hypoglycemics or insulin therapy at home.  Monitor CBG ac meals. Had SSI coverage when he was hospitalized.  04/09/14 CBG am 150-180, pm 186/235-adding Lantus 3 units qd qhs.      Relevant Medications      insulin glargine (LANTUS) 100 UNIT/ML injection      Review of Systems:  Review of Systems  Constitutional: Positive for weight loss. Negative for fever, chills, malaise/fatigue and diaphoresis.       About #15 Ibs in the past year. Generalized weakness.   HENT: Negative for congestion, ear discharge, ear pain, hearing loss, nosebleeds, sore throat and tinnitus.   Eyes: Negative for blurred vision, double vision, photophobia, pain, discharge and redness.  Respiratory: Negative for cough, hemoptysis, sputum production, shortness of breath, wheezing and stridor.   Cardiovascular: Negative for chest pain, palpitations, orthopnea, claudication, leg swelling and PND.  Gastrointestinal: Positive for abdominal pain. Negative for heartburn, nausea, vomiting, diarrhea, constipation, blood in stool and melena.       Epigastric area dull pain. He said it has been hurting for a long time  Genitourinary: Negative for dysuria, urgency, frequency, hematuria and flank pain.  Musculoskeletal: Negative for back pain, falls, joint pain, myalgias and neck pain.  Skin: Negative for itching and rash.  Neurological: Positive for weakness. Negative for dizziness, tingling, tremors, sensory change, speech change, focal weakness, seizures, loss of consciousness and headaches.  Endo/Heme/Allergies: Negative for environmental allergies and polydipsia. Does not bruise/bleed easily.  Psychiatric/Behavioral: Negative for depression, suicidal ideas, hallucinations, memory loss and substance abuse. The patient is not nervous/anxious and does not have insomnia.      Past Medical History  Diagnosis Date  . Diverticulosis of  colon (without mention of hemorrhage)   . Irritable bowel syndrome   . Personal history of malignant neoplasm of prostate     s/p  radioactive seed implants 2000  . Unspecified hereditary and idiopathic peripheral neuropathy   . Nonischemic cardiomyopathy cardiologist-  dr mAundra Dubin   EF 25%  per last echo 2012 and cardiologist note--  2007-  ef  40-45%;  2008- ef 35-40%;  2010- ef 30-35%;  2012- ef 25%  . Sinus bradycardia   . Hypertension   . Arthritis     hands  . Hyperlipidemia   . History of DVT of lower extremity     2002  ,   left leg  . History of adenomatous polyp of colon     2001  . History of basal cell carcinoma excision   . Anticoagulated on Coumadin   . Type 2 diabetes, diet controlled   . GERD (gastroesophageal reflux disease)   . LBBB (left bundle branch block)   . Wears glasses   . Wears hearing aid     BILATERAL  . Unsteady gait   . DDD (degenerative disc disease)     cervical and lumbar  . DJD (degenerative joint disease)     hips  .  Lower urinary tract symptoms (LUTS)   . 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 Coronary artery disease     per cath 2002  non-obstructive cad  . Chronic systolic heart failure   . H/O fracture of nose     12-30-2013-  pt fell at home open nose bone fx   Past Surgical History  Procedure Laterality Date  . Knee arthroscopy Left 1991  . Ercp with sphincterotomy and stone removal  05/ 1993  &  05-11-2006  . Total knee arthroplasty  10/05/2011    Procedure: TOTAL KNEE ARTHROPLASTY;  Surgeon: Gearlean Alf, MD;  Location: [facility];  Service: Orthopedics;  Laterality: Right;  . Transurethral resection of bladder tumor N/A 09/30/2012    Procedure: TRANSURETHRAL RESECTION OF BLADDER TUMOR (TURBT);  Surgeon: Franchot Gallo, MD;  Location: [facility];  Service: Urology;  Laterality: N/A;  WITH MITOMYCIN INSTILLATION   . Eus N/A 01/04/2014     Procedure: ESOPHAGEAL ENDOSCOPIC ULTRASOUND (EUS) RADIAL;  Surgeon: [name], MD;  Location: [facility];  Service: Endoscopy;  Laterality: N/A;  . Radioactive prostate seed implants  01/ 2000  . Mohs surgery  03/ 2009    LEFT EAR BASAL CELL CARCINOMA  . Tonsillectomy  AS CHILD  . Cholecystectomy open  1985  . Appendectomy  child  . Inguinal hernia repair Bilateral 1998    recurrent RIH  w/ repair 06-21-2006  . Total knee arthroplasty Left 02-21-2007    AND 04-21-2007  CLOSED MANIPULATION  . Cataract extraction w/ intraocular lens implant Right 2004  . Lumbar spine surgery  X2  . Cardiac catheterization  06-14-2001  dr Darnell Level brodie    non-obstructive CAD/  LM 30%,  pLAD 50%,  mLAD 30%,  RCA  &  CFX  irregularites ,  global hypokinesis,  ef 40%  . Cardiovascular stress test  02-07-2007   dr Darnell Level brodie    negative for ischemia,  low risk myoview perfusion study  . Transthoracic echocardiogram  01-28-2011    moderate LVH/  ef 25%/  diffuse hypokinesis/  mild LAE/  mild TR/  trivial AR & MR  . Cystoscopy w/ retrogrades Bilateral 03/09/2014    Procedure: CYSTOSCOPY WITH BILATERAL RETROGRADE PYELOGRAM BLADDER BX AND RENAL WASHINGS.;  Surgeon: Jorja Loa, MD;  Location: [facility];  Service: Urology;  Laterality: Bilateral;  . Eus N/A 04/05/2014    Procedure: UPPER ENDOSCOPIC ULTRASOUND (EUS) LINEAR;  Surgeon: [name], MD;  Location: [facility];  Service: Endoscopy;  Laterality: N/A;  radial linear   Social History:   reports that he quit smoking about 28 years ago. His smoking use included Cigarettes. He has a 10 pack-year smoking history. He has never used smokeless tobacco. He reports that he does not drink alcohol or use illicit drugs.  Family History  Problem Relation Age of Onset  . Throat cancer Brother   . Coronary artery disease Father     deceased    Medications: Patient's Medications  New Prescriptions   No medications on file    Previous Medications   BISACODYL (DULCOLAX) 10 MG SUPPOSITORY    Place 1 suppository (10 mg total) rectally daily as needed for moderate constipation.   CARVEDILOL (COREG) 12.5 MG TABLET    Take 12.5 mg by mouth 2 (two) times daily with a meal.   DOCUSATE SODIUM 100 MG CAPS  Take 100 mg by mouth 2 (two) times daily.   FEEDING SUPPLEMENT (BOOST HIGH PROTEIN) LIQD    Take 1 Container by mouth daily.   FISH OIL-OMEGA-3 FATTY ACIDS 1000 MG CAPSULE    Take 1 g by mouth daily.   HYDROCODONE-ACETAMINOPHEN (NORCO/VICODIN) 5-325 MG PER TABLET    Take 1 tablet by mouth every 4 (four) hours as needed for moderate pain.   INSULIN GLARGINE (LANTUS) 100 UNIT/ML INJECTION    Inject 3 Units into the skin at bedtime.   NITROGLYCERIN (NITROSTAT) 0.4 MG SL TABLET    Place 1 tablet (0.4 mg total) under the tongue every 5 (five) minutes as needed for chest pain.   POLYETHYLENE GLYCOL (MIRALAX / GLYCOLAX) PACKET    Take 17 g by mouth once as needed for mild constipation.    PRAVASTATIN (PRAVACHOL) 40 MG TABLET    Take 40 mg by mouth every evening.    PREGABALIN (LYRICA) 75 MG CAPSULE    Take 75 mg by mouth daily.   RANITIDINE (ZANTAC) 150 MG TABLET    Take 150 mg by mouth 2 (two) times daily.   ZOLPIDEM (AMBIEN) 10 MG TABLET    Take 10 mg by mouth at bedtime as needed for sleep.  Modified Medications   No medications on file  Discontinued Medications   FEEDING SUPPLEMENT, GLUCERNA SHAKE, (GLUCERNA SHAKE) LIQD    Take 237 mLs by mouth daily.   HYDROCODONE-ACETAMINOPHEN (NORCO/VICODIN) 5-325 MG PER TABLET    Take 1-2 tablets by mouth every 4 (four) hours as needed for moderate pain.   INSULIN ASPART (NOVOLOG) 100 UNIT/ML INJECTION    Inject 0-5 Units into the skin at bedtime.   INSULIN ASPART (NOVOLOG) 100 UNIT/ML INJECTION    Inject 0-9 Units into the skin 3 (three) times daily with meals.   INSULIN ASPART (NOVOLOG) 100 UNIT/ML INJECTION    Inject 3 Units into the skin 3 (three) times daily with meals.      Physical Exam: Physical Exam  Constitutional: He is oriented to person, place, and time. He appears well-developed and well-nourished.  HENT:  Head: Normocephalic and atraumatic.  Mouth/Throat: Oropharynx is clear and moist.  Eyes: Conjunctivae and EOM are normal. Pupils are equal, round, and reactive to light. Right eye exhibits no discharge. Left eye exhibits no discharge. No scleral icterus.  Neck: Normal range of motion. Neck supple. No JVD present. No tracheal deviation present. No thyromegaly present.  Cardiovascular: Normal rate, regular rhythm and normal heart sounds.   Pulmonary/Chest: Effort normal and breath sounds normal. No stridor. He has no wheezes. He has no rales. He exhibits no tenderness.  Abdominal: Soft. Bowel sounds are normal. He exhibits no distension. There is no tenderness. There is no rebound and no guarding.  Minimal epigastric tenderness.  No ascites  Musculoskeletal: Normal range of motion. He exhibits no edema and no tenderness.  Lymphadenopathy:    He has no cervical adenopathy.  Neurological: He is alert and oriented to person, place, and time. No cranial nerve deficit.  Skin: Skin is warm and dry. No rash noted. No erythema. No pallor.  Psychiatric: He has a normal mood and affect. His behavior is normal.    There were no vitals filed for this visit.    Labs reviewed: Basic Metabolic Panel:  Recent Labs  04/28/13 0946  03/29/14 1610 03/30/14 0424 03/31/14 0421  NA 139  < > 132* 137 135*  K 4.2  < > 3.9 3.5* 3.8  CL 101  < >  94* 100 99  CO2 28  < > 25 26 27   GLUCOSE 177*  < > 160* 130* 137*  BUN 26*  < > 20 17 14   CREATININE 1.2  < > 1.02 0.91 0.91  CALCIUM 9.7  < > 9.2 8.6 8.8  TSH 2.17  --   --   --   --   < > = values in this interval not displayed. Liver Function Tests:  Recent Labs  01/31/14 1608 03/29/14 1610 03/30/14 0424  AST 22 130* 63*  ALT 15 181* 118*  ALKPHOS 59 480* 344*  BILITOT 0.6 1.0 0.7  PROT 7.0 7.1  5.7*  ALBUMIN 3.8 3.4* 2.8*    Recent Labs  04/28/13 0946 12/19/13 1514 03/29/14 1610  LIPASE 22.0 8.0* 10*  AMYLASE 61  --   --    No results found for this basename: AMMONIA,  in the last 8760 hours CBC:  Recent Labs  12/30/13 1340  01/31/14 1608  03/29/14 1610 03/30/14 0424 03/31/14 0421  WBC 5.1  < > 4.4  < > 5.0 3.6* 4.5  NEUTROABS 3.5  --  2.9  --  4.1  --   --   HGB 11.8*  < > 11.7*  < > 10.5* 9.4* 9.9*  HCT 34.5*  < > 34.3*  < > 30.7* 27.9* 29.3*  MCV 92.7  < > 94.7  < > 92.5 93.0 95.1  PLT 189  < > 166.0  < > 194 170 169  < > = values in this interval not displayed. Lipid Panel: No results found for this basename: CHOL, HDL, LDLCALC, TRIG, CHOLHDL, LDLDIRECT,  in the last 8760 hours  Past Procedures:  03/29/14 CT abd and pelvis w CM:  IMPRESSION:  4.9 cm enhancing lesion adjacent to the pancreatic head/uncinate  process, worrisome for primary pancreatic adenocarcinoma,  progressed.  Tumor likely involves the SMA/SMV and undersurface of the portal  vein. 9 mm short axis node in the porta hepatitis.  Suspected multifocal hepatic metastases, including a 3.2 cm lesion  in the right hepatic dome.   03/30/14 MR w wo CM abd:   IMPRESSION:  1. Subtle amorphous mass in the pancreatic head and proximal  pancreatic body currently measuring 4.4 x 3.2 cm, causing distal  common bile duct obstruction and demonstrating vascular involvement  (SMA, SMV, splenic vein, splenic portal confluence and proximal  portal vein), as above, highly concerning for primary pancreatic  neoplasm.  2. Although there do appear to be multiple ill-defined hepatic  lesions, concerning for metastatic disease, these were poorly  evaluated on today's examination secondary to patient respiratory  motion. Continued attention on any future followup studies is  recommended, as metastatic disease to the liver is suspected.  3. Severe intra and extrahepatic biliary ductal dilatation.    Assessment/Plan Hyponatremia Likely secondary to dehydration. Resolved with IV fluids.  Bladder cancer Originally diagnosed 09/2012: Pathology showing low-grade papillary urothelial cancer. Status post TURBT with instillation of mitomycin 09/30/12. He has had persistent positive cytology on subsequent cystoscopies.  Status post cystoscopy with bilateral retrograde pyelogram, bladder biopsy and renal washings 03/09/14.  Biopsies negative for malignancy.  SYSTOLIC HEART FAILURE, CHRONIC Appears compensated. Continue Coreg. Lasix currently on hold secondary to dehydration.  chronic systolic heart failure/cardiomyopathy, last echocardiogram done in 2012 with EF of 25%,  HYPERTENSION, BENIGN Blood pressure controlled on Coreg.  DM Appears to be diet controlled. Not on oral hypoglycemics or insulin therapy at home.  Monitor CBG ac meals. Had  SSI coverage when he was hospitalized.  04/09/14 CBG am 150-180, pm 186/235-adding Lantus 3 units qd qhs.    Hypokalemia K 3.8 03/31/14. Update CMP  Malnutrition of moderate degree Pt met criteria for moderate MALNUTRITION in the context of chronic illness as evidenced by PO intake < 75% for one month, 8% body weight loss in 3 months.  Supplements per dietitian recommendations ordered  Normocytic anemia Likely multifactorial with hematuria in the setting of recent treatment with Coumadin, anemia of chronic disease secondary to advanced malignancy contributory.  No current indication for transfusion. Hgb 9s. Will update CBC   Transaminitis Likely from obstruction of the distal common bile duct secondary to liver metastasis.  For endoscopic ultrasound 04/05/14. May need stent. Update LFT  Dysuria Outpatient urine culture grew enterococcus, but followup culture done 03/30/14 negative.  Repeat urine culture. The patient did receive 3 days of therapy with Rocephin empirically on admission.  Pancreatic mass he patient likely has cancer related  cachexia with diminished appetite, malaise, and progressive weakness related to an advanced malignancy with evidence of liver metastasis.  Seen by palliative care team. The patient wants aggressive treatment/full CODE STATUS at this time.  IR consulted for consideration of CT-guided biopsy, MRI of the abdomen shows a 4.4 X3 0.2 cm pancreatic head mass with vascular involvement concerning for primary pancreatic neoplasm, and multiple ill-defined hepatic lesions concerning for metastatic disease. He has severe intra-and extrahepatic biliary ductal dilatation with mild elevation of LFTs. Unfortunately IR unable to safely do transcutaneous biopsy.  GI consulted for consideration of EUS with biopsy per patient wishes to pursue a diagnosis with aggressive treatment. Procedure planned for 04/05/14. Can be done inpatient/outpatient. Medically stable for D/C and has a bed at an ALF 04/04/14.  CA-19-9 rising which is consistent with likely pancreatic cancer.    Insomnia Takes Zolpidem 61m qhs prn for sleep.   GERD Takes ranitidine 1529mbid.   PERIPHERAL NEUROPATHY Takes Pregabalin 7526maily.   Unspecified constipation MiraLax bid prn and Colace 100m74md and Bisacodyl 10mg74mpository qd prn.     Family/ Staff Communication: observe the patient  Goals of Care: SNF  Labs/tests ordered: CBC and CMP

## 2014-04-04 NOTE — Assessment & Plan Note (Signed)
K 3.8 03/31/14. Update CMP

## 2014-04-04 NOTE — Telephone Encounter (Signed)
Pt instructions for procedure faxed to [place] at [phone].

## 2014-04-04 NOTE — Assessment & Plan Note (Addendum)
Appears compensated. Continue Coreg. Lasix currently on hold secondary to dehydration.  chronic systolic heart failure/cardiomyopathy, last echocardiogram done in 2012 with EF of 25%,

## 2014-04-04 NOTE — Assessment & Plan Note (Signed)
Blood pressure controlled on Coreg.

## 2014-04-04 NOTE — Assessment & Plan Note (Signed)
Takes ranitidine 150mg bid.    

## 2014-04-04 NOTE — Assessment & Plan Note (Addendum)
MiraLax bid prn and Colace 100mg  bid and Bisacodyl 10mg  suppository qd prn.

## 2014-04-04 NOTE — Assessment & Plan Note (Addendum)
Appears to be diet controlled. Not on oral hypoglycemics or insulin therapy at home.  Monitor CBG ac meals. Had SSI coverage when he was hospitalized.  04/09/14 CBG am 150-180, pm 186/235-adding Lantus 3 units qd qhs.

## 2014-04-04 NOTE — Assessment & Plan Note (Signed)
he patient likely has cancer related cachexia with diminished appetite, malaise, and progressive weakness related to an advanced malignancy with evidence of liver metastasis.  Seen by palliative care team. The patient wants aggressive treatment/full CODE STATUS at this time.  IR consulted for consideration of CT-guided biopsy, MRI of the abdomen shows a 4.4 X3 0.2 cm pancreatic head mass with vascular involvement concerning for primary pancreatic neoplasm, and multiple ill-defined hepatic lesions concerning for metastatic disease. He has severe intra-and extrahepatic biliary ductal dilatation with mild elevation of LFTs. Unfortunately IR unable to safely do transcutaneous biopsy.  GI consulted for consideration of EUS with biopsy per patient wishes to pursue a diagnosis with aggressive treatment. Procedure planned for 04/05/14. Can be done inpatient/outpatient. Medically stable for D/C and has a bed at an ALF 04/04/14.  CA-19-9 rising which is consistent with likely pancreatic cancer.

## 2014-04-04 NOTE — Assessment & Plan Note (Signed)
Likely secondary to dehydration. Resolved with IV fluids.

## 2014-04-04 NOTE — Progress Notes (Signed)
     [facility] Progress Note  Subjective:   Feels well. No complaints. For discharge today to ALF. Scheduled for EUS tomorrow as outpatient.    Objective:  Vital signs in last 24 hours: Temp:  [98.3 F (36.8 C)-98.5 F (36.9 C)] 98.5 F (36.9 C) (09/16 7741) Pulse Rate:  [57-94] 66 (09/16 0614) Resp:  [16-20] 20 (09/16 0614) BP: (113-131)/(50-60) 131/60 mmHg (09/16 0614) SpO2:  [95 %-100 %] 96 % (09/16 0614) Last BM Date: 04/02/14 General:   Alert,  Well-developed,  male  in NAD Heart:  Regular rate and rhythm; no murmurs Pulm Lungs clear Abdomen:  Soft, nontender and nondistended. Normal bowel sounds, without guarding, and without rebound.   Extremities:  Without edema. Neurologic:  Alert and  oriented x4;  grossly normal neurologically. Psych:  Alert and cooperative. Normal mood and affect.  Intake/Output from previous day: 09/15 0701 - 09/16 0700 In: 58 [P.O.:720] Out: 1500 [Urine:1500] Intake/Output this shift:     ASSESSMENT/PLAN:  80 yo male with pancreatic mass with liver metastasis admitted with wt loss, failure to thrive and weakness. Pt has been eval by IR and they felt they were unable to safely do a transcutaneous biopsy. Pt has been scheduled for an EUS tomorrow at [facility]. Pt to be d/c to ALF today. His instructions for outpatient EUS were given to his nurse 2 days ago, and pt says the instructions were given to his friend who will pick him up at ALF tomorrow and bring him to hospital for EUS. Will check with social worker as to if there are any orders, etc we need for ALF.    LOS: 6 days   Hvozdovic, Deloris Ping 04/04/2014, Pager [phone]  Agree - Gatha Mayer, MD, [facility]

## 2014-04-05 ENCOUNTER — Telehealth: Payer: Self-pay

## 2014-04-05 ENCOUNTER — Ambulatory Visit (HOSPITAL_COMMUNITY): Payer: Medicare Other | Admitting: Anesthesiology

## 2014-04-05 ENCOUNTER — Ambulatory Visit (HOSPITAL_COMMUNITY)
Admission: RE | Admit: 2014-04-05 | Discharge: 2014-04-05 | Disposition: A | Discharge status: Home / Self Care | Payer: Medicare Other | Source: Ambulatory Visit | Attending: Gastroenterology | Admitting: Gastroenterology

## 2014-04-05 ENCOUNTER — Encounter (HOSPITAL_COMMUNITY): Payer: Medicare Other | Admitting: Anesthesiology

## 2014-04-05 ENCOUNTER — Encounter (HOSPITAL_COMMUNITY): Payer: Self-pay | Admitting: Gastroenterology

## 2014-04-05 ENCOUNTER — Encounter (HOSPITAL_COMMUNITY)
Admission: RE | Disposition: A | Discharge status: Home / Self Care | Payer: Self-pay | Source: Ambulatory Visit | Attending: Gastroenterology

## 2014-04-05 DIAGNOSIS — C259 Malignant neoplasm of pancreas, unspecified: Secondary | ICD-10-CM | POA: Insufficient documentation

## 2014-04-05 DIAGNOSIS — C787 Secondary malignant neoplasm of liver and intrahepatic bile duct: Secondary | ICD-10-CM | POA: Diagnosis not present

## 2014-04-05 DIAGNOSIS — K8689 Other specified diseases of pancreas: Secondary | ICD-10-CM

## 2014-04-05 DIAGNOSIS — I868 Varicose veins of other specified sites: Secondary | ICD-10-CM | POA: Diagnosis not present

## 2014-04-05 DIAGNOSIS — K869 Disease of pancreas, unspecified: Secondary | ICD-10-CM

## 2014-04-05 HISTORY — PX: EUS: SHX5427

## 2014-04-05 LAB — GLUCOSE, CAPILLARY: GLUCOSE-CAPILLARY: 158 mg/dL — AB (ref 70–99)

## 2014-04-05 SURGERY — UPPER ENDOSCOPIC ULTRASOUND (EUS) LINEAR
Anesthesia: Monitor Anesthesia Care

## 2014-04-05 MED ORDER — EPHEDRINE SULFATE 50 MG/ML IJ SOLN
INTRAMUSCULAR | Status: AC
  Filled 2014-04-05: qty 1

## 2014-04-05 MED ORDER — EPHEDRINE SULFATE 50 MG/ML IJ SOLN
INTRAMUSCULAR | Status: DC | PRN
Start: 2014-04-05 — End: 2014-04-05
  Administered 2014-04-05: 5 mg via INTRAVENOUS
  Administered 2014-04-05: 10 mg via INTRAVENOUS
  Administered 2014-04-05 (×2): 5 mg via INTRAVENOUS

## 2014-04-05 MED ORDER — SODIUM CHLORIDE 0.9 % IJ SOLN
INTRAMUSCULAR | Status: AC
  Filled 2014-04-05: qty 10

## 2014-04-05 MED ORDER — LACTATED RINGERS IV SOLN
INTRAVENOUS | Status: DC
Start: 2014-04-05 — End: 2014-04-05
  Administered 2014-04-05: 11:00:00 via INTRAVENOUS

## 2014-04-05 MED ORDER — PROPOFOL INFUSION 10 MG/ML OPTIME
INTRAVENOUS | Status: DC | PRN
  Administered 2014-04-05: 300 ug/kg/min via INTRAVENOUS

## 2014-04-05 MED ORDER — SODIUM CHLORIDE 0.9 % IV SOLN: INTRAVENOUS | Status: DC

## 2014-04-05 MED ORDER — PROPOFOL 10 MG/ML IV BOLUS
INTRAVENOUS | Status: AC
  Filled 2014-04-05: qty 20

## 2014-04-05 MED ORDER — PROPOFOL 10 MG/ML IV BOLUS
INTRAVENOUS | Status: AC
  Filled 2014-04-05: qty 60

## 2014-04-05 NOTE — Anesthesia Postprocedure Evaluation (Signed)
  Anesthesia Post-op Note  Patient: [name] [name]  Procedure(s) Performed: Procedure(s) (LRB): UPPER ENDOSCOPIC ULTRASOUND (EUS) LINEAR (N/A)  Patient Location: PACU  Anesthesia Type: MAC  Level of Consciousness: awake and alert   Airway and Oxygen Therapy: Patient Spontanous Breathing  Post-op Pain: mild  Post-op Assessment: Post-op Vital signs reviewed, Patient's Cardiovascular Status Stable, Respiratory Function Stable, Patent Airway and No signs of Nausea or vomiting  Last Vitals:  Filed Vitals:   04/05/14 1209  BP: 90/42  Pulse:   Temp:   Resp:     Post-op Vital Signs: stable   Complications: No apparent anesthesia complications

## 2014-04-05 NOTE — Interval H&P Note (Signed)
History and Physical Interval Note:  04/05/2014 10:20 AM  [name] [name]  has presented today for surgery, with the diagnosis of Pancreatic mass [577.9]  The various methods of treatment have been discussed with the patient and family. After consideration of risks, benefits and other options for treatment, the patient has consented to  Procedure(s) with comments: UPPER ENDOSCOPIC ULTRASOUND (EUS) LINEAR (N/A) - radial linear as a surgical intervention .  The patient's history has been reviewed, patient examined, no change in status, stable for surgery.  I have reviewed the patient's chart and labs.  Questions were answered to the patient's satisfaction.     [name]

## 2014-04-05 NOTE — Telephone Encounter (Signed)
[name] [name],  Can the med oncology referral be to Dr. Burney Gauze, thanks?

## 2014-04-05 NOTE — Telephone Encounter (Signed)
Message copied by Barron Alvine on Thu Apr 05, 2014  1:29 PM ------      Message from: Owens Loffler P      Created: Thu Apr 05, 2014 12:17 PM       [name] [name], [name] [name] was admitted recently and decided he wanted to fully workup and potentially treat the pancreatic mass (not by our recommendations, but after discussion with palliative care). Just completed EUS, see full note in chart.                    [name] [name], [name] [name] needs ERCP next week with metal biliary stent placement, any day that MAC/general is available.  I should still have time on Thursday.             Also he needs referral to medical oncology for new diagnosed, likely Stage IV pancreatic adenocarcinoma.            Thanks       ------

## 2014-04-05 NOTE — Telephone Encounter (Signed)
See alternate phone note, I have also called and referred to Dr Marin Olp, he will review and call pt with appt,  Helene Kelp at the pt's assisted living facility was called with the ERCP info and this was also faxed to [phone].

## 2014-04-05 NOTE — Discharge Instructions (Signed)

## 2014-04-05 NOTE — Transfer of Care (Signed)
Immediate Anesthesia Transfer of Care Note  Patient: [name] [name]  Procedure(s) Performed: Procedure(s) with comments: UPPER ENDOSCOPIC ULTRASOUND (EUS) LINEAR (N/A) - radial linear  Patient Location: PACU and Endoscopy Unit  Anesthesia Type:MAC  Level of Consciousness: sedated and patient cooperative  Airway & Oxygen Therapy: Patient Spontanous Breathing and Patient connected to nasal cannula oxygen  Post-op Assessment: Report given to PACU RN and Post -op Vital signs reviewed and stable  Post vital signs: Reviewed and stable  Complications: No apparent anesthesia complications

## 2014-04-05 NOTE — Op Note (Signed)
[facility] [place], [id number]   ENDOSCOPIC ULTRASOUND PROCEDURE REPORT  PATIENT: [name] [name], [name] [name]  MR#: [id number] BIRTHDATE: 01-13-1923  GENDER: Male ENDOSCOPIST: [name], MD PROCEDURE DATE:  04/05/2014 PROCEDURE:   Upper EUS w/FNA ASA CLASS:      Class III INDICATIONS:   1.  pancreatic mass, dilated bile ducts, elevated CA 19-9; previous EUS 3 months ago, unable to visualize the mass; given age, frailty elected not to pursue further testing (agreed with PCP Dr.  Shawna Orleans).  Recently admitted to hospital and palliative care evaluation determined patient was interested in completing workup, repeat biospy and also would want any available treatments.  MEDICATIONS: MAC sedation, administered by CRNA  DESCRIPTION OF PROCEDURE:   After the risks benefits and alternatives of the procedure were  explained, informed consent was obtained. The patient was then placed in the left, lateral, decubitus postion and IV sedation was administered. Throughout the procedure, the patients blood pressure, pulse and oxygen saturations were monitored continuously.  Under direct visualization, the PENTAX EUS SCOPE  endoscope was introduced through the mouth  and advanced to the second portion of the duodenum .  Water was used as necessary to provide an acoustic interface.  Upon completion of the imaging, water was removed and the patient was sent to the recovery room in satisfactory condition.  Endoscopic findings: 1. Suggestion of proximal gastric varices, othewise normal UGI tract EUS findings: 1. Very poorly defined, somewhat hyopechoic region in expected location of head of pancras, mass described on CT, MRIs recently. The mass was best localized by attenuated portal vein Doppler signal and that is where I performed transduodenal FNA with 25 guague EUS FNA needle under suction. 2. CBD was markedly dilated into the head of pancreas (maximum diameter 2cm),  there was clear sludge within the bile duct as well. 3. Main pancreatic duct was dilated in body, tail (up to 4cm in tail). 4. No peripancreatic adenopathy. 5. There were clear proximal gastric varices on doppler imaging.  Impression: Very poorly visualized, vague mass like soft tissue in expected location of the pancreatic head.  This was sampled and preliminary cytology review was positive for malignancy (adenocarcinoma). Given suspicious lesion(s) in liver this is likely Stage IV, also recent CT and MRI suggest significant vascular involvement (SMA, SMV, splenic vein, splenic portal confluence and proximal portal vein).  His bilirubin has been normal but alk phos and tranaminases are significantly elevated and he has clear biliary obstruction on this exam, other imaging.  I will plan on ERCP, metal stent placement next week and will refer him to medical oncology.  _______________________________ eSignedMilus Banister, MD 04/05/2014 12:16 PM

## 2014-04-05 NOTE — H&P (View-Only) (Signed)
     [facility] Progress Note  Subjective:   Feels well. No complaints. For discharge today to ALF. Scheduled for EUS tomorrow as outpatient.    Objective:  Vital signs in last 24 hours: Temp:  [98.3 F (36.8 C)-98.5 F (36.9 C)] 98.5 F (36.9 C) (09/16 0100) Pulse Rate:  [57-94] 66 (09/16 0614) Resp:  [16-20] 20 (09/16 0614) BP: (113-131)/(50-60) 131/60 mmHg (09/16 0614) SpO2:  [95 %-100 %] 96 % (09/16 0614) Last BM Date: 04/02/14 General:   Alert,  Well-developed,  male  in NAD Heart:  Regular rate and rhythm; no murmurs Pulm Lungs clear Abdomen:  Soft, nontender and nondistended. Normal bowel sounds, without guarding, and without rebound.   Extremities:  Without edema. Neurologic:  Alert and  oriented x4;  grossly normal neurologically. Psych:  Alert and cooperative. Normal mood and affect.  Intake/Output from previous day: 09/15 0701 - 09/16 0700 In: 63 [P.O.:720] Out: 1500 [Urine:1500] Intake/Output this shift:     ASSESSMENT/PLAN:  80 yo male with pancreatic mass with liver metastasis admitted with wt loss, failure to thrive and weakness. Pt has been eval by IR and they felt they were unable to safely do a transcutaneous biopsy. Pt has been scheduled for an EUS tomorrow at [facility]. Pt to be d/c to ALF today. His instructions for outpatient EUS were given to his nurse 2 days ago, and pt says the instructions were given to his friend who will pick him up at ALF tomorrow and bring him to hospital for EUS. Will check with social worker as to if there are any orders, etc we need for ALF.    LOS: 6 days   Hvozdovic, Deloris Ping 04/04/2014, Pager [phone]  Agree - Gatha Mayer, MD, [facility]

## 2014-04-06 ENCOUNTER — Encounter (HOSPITAL_COMMUNITY): Payer: Self-pay | Admitting: Gastroenterology

## 2014-04-06 ENCOUNTER — Ambulatory Visit (INDEPENDENT_AMBULATORY_CARE_PROVIDER_SITE_OTHER): Payer: Medicare Other | Admitting: Internal Medicine

## 2014-04-06 ENCOUNTER — Encounter (HOSPITAL_COMMUNITY): Payer: Self-pay | Admitting: *Deleted

## 2014-04-06 ENCOUNTER — Ambulatory Visit: Payer: Medicare Other | Admitting: Internal Medicine

## 2014-04-06 ENCOUNTER — Encounter (HOSPITAL_COMMUNITY): Payer: Self-pay | Admitting: Pharmacy Technician

## 2014-04-06 VITALS — BP 120/62 | HR 68 | Temp 98.9°F | Wt 172.0 lb

## 2014-04-06 DIAGNOSIS — C250 Malignant neoplasm of head of pancreas: Secondary | ICD-10-CM

## 2014-04-06 DIAGNOSIS — C259 Malignant neoplasm of pancreas, unspecified: Secondary | ICD-10-CM | POA: Insufficient documentation

## 2014-04-06 DIAGNOSIS — I2510 Atherosclerotic heart disease of native coronary artery without angina pectoris: Secondary | ICD-10-CM

## 2014-04-06 DIAGNOSIS — Z0289 Encounter for other administrative examinations: Secondary | ICD-10-CM

## 2014-04-06 DIAGNOSIS — Z23 Encounter for immunization: Secondary | ICD-10-CM

## 2014-04-06 HISTORY — DX: Malignant neoplasm of pancreas, unspecified: C25.9

## 2014-04-06 NOTE — Progress Notes (Signed)
Pre visit review using our clinic review tool, if applicable. No additional management support is needed unless otherwise documented below in the visit note. Lab Results  Component Value Date   HGBA1C 7.3* 12/20/2013   HGBA1C 6.9* 02/23/2013   HGBA1C 6.7* 08/31/2012   Lab Results  Component Value Date   LDLCALC 83 02/23/2013   CREATININE 0.91 03/31/2014

## 2014-04-06 NOTE — Assessment & Plan Note (Addendum)
Patient admitted on 03/29/2014 or malaise decrease in appetite he hematuria.  MRI of abdomen showed: 1. Subtle amorphous mass in the pancreatic head and proximal pancreatic body currently measuring 4.4 x 3.2 cm, causing distal common bile duct obstruction and demonstrating vascular involvement  (SMA, SMV, splenic vein, splenic portal confluence and proximal  portal vein), as above, highly concerning for primary pancreatic  neoplasm.  2. Although there do appear to be multiple ill-defined hepatic  lesions, concerning for metastatic disease, these were poorly  evaluated on today's examination secondary to patient respiratory  motion. Continued attention on any future followup studies is  recommended, as metastatic disease to the liver is suspected.  3. Severe intra and extrahepatic biliary ductal dilatation.  Endoscopic biopsy performed by Dr. Ardis Hughs. Pathology showed malignant cells consistent with adenocarcinoma. He likely has stage IV pancreatic cancer. I had lengthy discussion with patient and his medical power of attorney that Mr Beske has terminal illness and may not be a candidate for chemotherapy considering is advanced age and multiple co morbidities.  Patient also has poor functional status. Patient elects to still pursue possible treatment.  He has referral to Dr. Marin Olp.  Proceed with placement of palliative biliary stent.

## 2014-04-06 NOTE — Progress Notes (Signed)
Subjective:    Patient ID: [name] [name], male    DOB: 1923-04-11, 80 y.o.   MRN: [id number]  HPI  80 year old white male for hospital followup. Patient admitted on 03/29/2014 with complaints of malaise, decreased appetite, generalized weakness and hematuria. CT scan scan of abdomen and pelvis showed 4.9 cm enhancing lesion adjacent to the pancreatic head/uncinate process worrisome for a primary pancreatic adenocarcinoma and suspected multifocal hepatic metastasis.  The possibility of pancreatic cancer was discussed at last office visit. Despite thorough discussion, patient changed his mind during hospitalization and expressed desire to pursue further treatment for his possible pancreatic cancer.  Palliative care consult evaluated his decision making capacity.  He underwent EUS by Dr. Ardis Hughs on 04/05/2014. Cytopathology showed malignant cells consistent with adenocarcinoma.  He does not have any family.  He is accompanied by two close friends.  [name] [name] is designated medical POA.  Referral to oncologist - Dr. Marin Olp already initiated by Dr. Ardis Hughs.  Review of Systems    mild weight loss, fatigue, poor appetite  Past Medical History  Diagnosis Date  . Diverticulosis of colon (without mention of hemorrhage)   . Irritable bowel syndrome   . Personal history of malignant neoplasm of prostate     s/p  radioactive seed implants 2000  . Unspecified hereditary and idiopathic peripheral neuropathy   . Nonischemic cardiomyopathy cardiologist-  dr Aundra Dubin    EF 25%  per last echo 2012 and cardiologist note--  2007-  ef  40-45%;  2008- ef 35-40%;  2010- ef 30-35%;  2012- ef 25%  . Sinus bradycardia   . Hypertension   . Arthritis     hands  . Hyperlipidemia   . History of DVT of lower extremity     2002  ,   left leg  . History of adenomatous polyp of colon     2001  . History of basal cell carcinoma excision   . Anticoagulated on Coumadin   . Type 2 diabetes, diet controlled   . GERD  (gastroesophageal reflux disease)   . LBBB (left bundle branch block)   . Wears glasses   . Wears hearing aid     BILATERAL  . Unsteady gait   . DDD (degenerative disc disease)     cervical and lumbar  . DJD (degenerative joint disease)     hips  . Lower urinary tract symptoms (LUTS)   . 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 Coronary artery disease     per cath 2002  non-obstructive cad  . Chronic systolic heart failure   . H/O fracture of nose     12-30-2013-  pt fell at home open nose bone fx    History   Social History  . Marital Status: Widowed    Spouse Name: N/A    Number of Children: 0  . Years of Education: N/A   Occupational History  .     Social History Main Topics  . Smoking status: Former Smoker -- 1.00 packs/day for 10 years    Types: Cigarettes    Quit date: 07/20/1985  . Smokeless tobacco: Never Used  . Alcohol Use: No  . Drug Use: No  . Sexual Activity: Not on file   Other Topics Concern  . Not on file   Social History Narrative   Married, no children.     Past Surgical History  Procedure  Laterality Date  . Knee arthroscopy Left 1991  . Ercp with sphincterotomy and stone removal  05/ 1993  &  05-11-2006  . Total knee arthroplasty  10/05/2011    Procedure: TOTAL KNEE ARTHROPLASTY;  Surgeon: Gearlean Alf, MD;  Location: [facility];  Service: Orthopedics;  Laterality: Right;  . Transurethral resection of bladder tumor N/A 09/30/2012    Procedure: TRANSURETHRAL RESECTION OF BLADDER TUMOR (TURBT);  Surgeon: Franchot Gallo, MD;  Location: [facility];  Service: Urology;  Laterality: N/A;  WITH MITOMYCIN INSTILLATION   . Eus N/A 01/04/2014    Procedure: ESOPHAGEAL ENDOSCOPIC ULTRASOUND (EUS) RADIAL;  Surgeon: [name], MD;  Location: [facility];  Service: Endoscopy;  Laterality: N/A;  . Radioactive prostate seed implants  01/ 2000  . Mohs surgery  03/ 2009     LEFT EAR BASAL CELL CARCINOMA  . Tonsillectomy  AS CHILD  . Cholecystectomy open  1985  . Appendectomy  child  . Inguinal hernia repair Bilateral 1998    recurrent RIH  w/ repair 06-21-2006  . Total knee arthroplasty Left 02-21-2007    AND 04-21-2007  CLOSED MANIPULATION  . Cataract extraction w/ intraocular lens implant Right 2004  . Lumbar spine surgery  X2  . Cardiac catheterization  06-14-2001  dr Darnell Level brodie    non-obstructive CAD/  LM 30%,  pLAD 50%,  mLAD 30%,  RCA  &  CFX  irregularites ,  global hypokinesis,  ef 40%  . Cardiovascular stress test  02-07-2007   dr Darnell Level brodie    negative for ischemia,  low risk myoview perfusion study  . Transthoracic echocardiogram  01-28-2011    moderate LVH/  ef 25%/  diffuse hypokinesis/  mild LAE/  mild TR/  trivial AR & MR  . Cystoscopy w/ retrogrades Bilateral 03/09/2014    Procedure: CYSTOSCOPY WITH BILATERAL RETROGRADE PYELOGRAM BLADDER BX AND RENAL WASHINGS.;  Surgeon: Jorja Loa, MD;  Location: [facility];  Service: Urology;  Laterality: Bilateral;  . Eus N/A 04/05/2014    Procedure: UPPER ENDOSCOPIC ULTRASOUND (EUS) LINEAR;  Surgeon: [name], MD;  Location: [facility];  Service: Endoscopy;  Laterality: N/A;  radial linear    Family History  Problem Relation Age of Onset  . Throat cancer Brother   . Coronary artery disease Father     deceased    No Known Allergies  Current Outpatient Prescriptions on File Prior to Visit  Medication Sig Dispense Refill  . bisacodyl (DULCOLAX) 10 MG suppository Place 1 suppository (10 mg total) rectally daily as needed for moderate constipation.  12 suppository  0  . carvedilol (COREG) 12.5 MG tablet Take 12.5 mg by mouth 2 (two) times daily with a meal.      . docusate sodium 100 MG CAPS Take 100 mg by mouth 2 (two) times daily.  60 capsule  0  . fish oil-omega-3 fatty acids 1000 MG capsule Take 1 g by mouth daily.      . nitroGLYCERIN (NITROSTAT) 0.4 MG SL  tablet Place 1 tablet (0.4 mg total) under the tongue every 5 (five) minutes as needed for chest pain.  90 tablet  3  . polyethylene glycol (MIRALAX / GLYCOLAX) packet Take 17 g by mouth once as needed for mild constipation.       . pravastatin (PRAVACHOL) 40 MG tablet Take 40 mg by mouth every evening.       . pregabalin (LYRICA) 75 MG capsule Take 75 mg by mouth daily.      Marland Kitchen  ranitidine (ZANTAC) 150 MG tablet Take 150 mg by mouth 2 (two) times daily.      Marland Kitchen zolpidem (AMBIEN) 10 MG tablet Take 10 mg by mouth at bedtime as needed for sleep.      . [DISCONTINUED] Calcium Carbonate-Vitamin D (CALTRATE 600+D) 600-400 MG-UNIT per tablet Take 1 tablet by mouth daily.        No current facility-administered medications on file prior to visit.    BP 120/62  Pulse 68  Temp(Src) 98.9 F (37.2 C) (Oral)  Wt 172 lb (78.019 kg)    Objective:   Physical Exam  Constitutional: He is oriented to person, place, and time. No distress.  Frail 80 y/o white male  HENT:  Head: Normocephalic and atraumatic.  Bilateral hearing aids  Cardiovascular: Normal rate, regular rhythm and normal heart sounds.   Pulmonary/Chest: Effort normal and breath sounds normal. He has no wheezes.  Abdominal: Soft. Bowel sounds are normal. There is no tenderness.  Musculoskeletal: He exhibits no edema.  Neurological: He is oriented to person, place, and time.  Ambulates with walker  Skin: Skin is warm and dry.  Psychiatric: He has a normal mood and affect. His behavior is normal.          Assessment & Plan:

## 2014-04-09 ENCOUNTER — Telehealth: Payer: Self-pay

## 2014-04-09 ENCOUNTER — Telehealth: Payer: Self-pay | Admitting: Hematology & Oncology

## 2014-04-09 ENCOUNTER — Other Ambulatory Visit: Payer: Self-pay | Admitting: Family

## 2014-04-09 DIAGNOSIS — C250 Malignant neoplasm of head of pancreas: Secondary | ICD-10-CM

## 2014-04-09 NOTE — Telephone Encounter (Signed)
I tried to call NEW PATIENT today to remind them of their appointment with Dr. Marin Olp. Also, advised them to bring all medication bottles and insurance card information.  However, the phone listed w the ext is a company number and they said they could not located him at this time. The other number is disconnected. And, I was told by Cathlean Sauer said the pt is aware of this appt.

## 2014-04-09 NOTE — Telephone Encounter (Signed)
Message left with the nurse line at Dr Vernona Rieger office to get expidited appt

## 2014-04-09 NOTE — Telephone Encounter (Signed)
I spoke with the assisted living facility and the appt for 04/10/14 9 am at Dr Marin Olp is ok, they will cancel an orthopedic appt for the same day and take him to Dr Marin Olp instead.  I confirmed the date and time again and also called Dr Antonieta Pert office back at [phone] and left a message for [facility] confirming appt.

## 2014-04-09 NOTE — Telephone Encounter (Signed)
Per Vivia Birmingham to she patient for 04/10/14 NP apt.  Rn stated patient was aware of apt

## 2014-04-09 NOTE — Telephone Encounter (Signed)
Message copied by Barron Alvine on Mon Apr 09, 2014  8:14 AM ------      Message from: Barron Alvine      Created: Thu Apr 05, 2014  2:38 PM       Make sure he has appt with Dr Marin Olp. ------

## 2014-04-10 ENCOUNTER — Encounter: Payer: Self-pay | Admitting: Family

## 2014-04-10 ENCOUNTER — Other Ambulatory Visit (HOSPITAL_BASED_OUTPATIENT_CLINIC_OR_DEPARTMENT_OTHER): Payer: Medicare Other | Admitting: Lab

## 2014-04-10 ENCOUNTER — Ambulatory Visit: Payer: Medicare Other

## 2014-04-10 ENCOUNTER — Ambulatory Visit (HOSPITAL_BASED_OUTPATIENT_CLINIC_OR_DEPARTMENT_OTHER): Payer: Medicare Other | Admitting: Family

## 2014-04-10 VITALS — BP 125/52 | HR 62 | Temp 98.4°F | Resp 18 | Ht 69.0 in | Wt 172.0 lb

## 2014-04-10 DIAGNOSIS — C258 Malignant neoplasm of overlapping sites of pancreas: Secondary | ICD-10-CM

## 2014-04-10 DIAGNOSIS — C787 Secondary malignant neoplasm of liver and intrahepatic bile duct: Secondary | ICD-10-CM

## 2014-04-10 DIAGNOSIS — Z8546 Personal history of malignant neoplasm of prostate: Secondary | ICD-10-CM

## 2014-04-10 DIAGNOSIS — C250 Malignant neoplasm of head of pancreas: Secondary | ICD-10-CM

## 2014-04-10 DIAGNOSIS — R109 Unspecified abdominal pain: Secondary | ICD-10-CM

## 2014-04-10 DIAGNOSIS — Z8551 Personal history of malignant neoplasm of bladder: Secondary | ICD-10-CM

## 2014-04-10 LAB — CBC WITH DIFFERENTIAL (CANCER CENTER ONLY)
BASO#: 0 10*3/uL (ref 0.0–0.2)
BASO%: 0.4 % (ref 0.0–2.0)
EOS%: 0.7 % (ref 0.0–7.0)
Eosinophils Absolute: 0.1 10*3/uL (ref 0.0–0.5)
HCT: 34 % — ABNORMAL LOW (ref 38.7–49.9)
HGB: 11.4 g/dL — ABNORMAL LOW (ref 13.0–17.1)
LYMPH#: 1.1 10*3/uL (ref 0.9–3.3)
LYMPH%: 15.6 % (ref 14.0–48.0)
MCH: 32 pg (ref 28.0–33.4)
MCHC: 33.5 g/dL (ref 32.0–35.9)
MCV: 96 fL (ref 82–98)
MONO#: 0.6 10*3/uL (ref 0.1–0.9)
MONO%: 8.7 % (ref 0.0–13.0)
NEUT#: 5.5 10*3/uL (ref 1.5–6.5)
NEUT%: 74.6 % (ref 40.0–80.0)
Platelets: 267 10*3/uL (ref 145–400)
RBC: 3.56 10*6/uL — ABNORMAL LOW (ref 4.20–5.70)
RDW: 13 % (ref 11.1–15.7)
WBC: 7.3 10*3/uL (ref 4.0–10.0)

## 2014-04-10 LAB — CBC AND DIFFERENTIAL
HEMATOCRIT: 30 % — AB (ref 41–53)
Hemoglobin: 9.3 g/dL — AB (ref 13.5–17.5)
PLATELETS: 238 10*3/uL (ref 150–399)
WBC: 4.6 10^3/mL

## 2014-04-10 LAB — HEPATIC FUNCTION PANEL
ALK PHOS: 126 U/L — AB (ref 25–125)
ALT: 23 U/L (ref 10–40)
AST: 26 U/L (ref 14–40)
Bilirubin, Total: 0.4 mg/dL

## 2014-04-10 LAB — COMPREHENSIVE METABOLIC PANEL
ALT: 20 U/L (ref 0–53)
AST: 17 U/L (ref 0–37)
Albumin: 4 g/dL (ref 3.5–5.2)
Alkaline Phosphatase: 146 U/L — ABNORMAL HIGH (ref 39–117)
BILIRUBIN TOTAL: 0.5 mg/dL (ref 0.2–1.2)
BUN: 23 mg/dL (ref 6–23)
CO2: 26 mEq/L (ref 19–32)
CREATININE: 1 mg/dL (ref 0.50–1.35)
Calcium: 9.6 mg/dL (ref 8.4–10.5)
Chloride: 100 mEq/L (ref 96–112)
Glucose, Bld: 196 mg/dL — ABNORMAL HIGH (ref 70–99)
Potassium: 4.7 mEq/L (ref 3.5–5.3)
Sodium: 136 mEq/L (ref 135–145)
Total Protein: 7.3 g/dL (ref 6.0–8.3)

## 2014-04-10 LAB — CANCER ANTIGEN 19-9: CA 19 9: 373.4 U/mL — AB (ref <35.0)

## 2014-04-10 LAB — BASIC METABOLIC PANEL
BUN: 22 mg/dL — AB (ref 4–21)
Creatinine: 1.1 mg/dL (ref 0.6–1.3)
GLUCOSE: 147 mg/dL
Potassium: 4.1 mmol/L (ref 3.4–5.3)
Sodium: 137 mmol/L (ref 137–147)

## 2014-04-10 LAB — PREALBUMIN: Prealbumin: 17.9 mg/dL (ref 17.0–34.0)

## 2014-04-10 NOTE — Progress Notes (Signed)
Hematology/Oncology Consultation   Name: [name] [name]      MRN: [id number]    Location: Room/bed info not found  Date: 04/10/2014 Time:10:32 AM   REFERRING PHYSICIAN: Gatha Mayer  REASON FOR CONSULT:  New diagnosis pancreatic cancer with mets to the liver   DIAGNOSIS:  Stage IV pancreatic cancer with mets to liver   HISTORY OF PRESENT ILLNESS:  [name] [name] is a very pleasant 80 yo male with stage IV metastatic pancreatic cancer. He was recently hospitalized with abdominal pain. Scans showed area on pancreas and liver suspicious for malignancy. Fine needle biopsy of pancrease confirmed adenocarcinoma. He is a widower and lives at [facility] in [place]. He is still active playing golf. He is originally from [place]. He was in the [employer] and also worked for [employer]. He denies fever, chills, n/v, cough, rash, headache, dizziness, SOB, chest pain, palpitations, diarrhea, blood in urine or stool. He uses stool softeners for occasional constipation. He stills has some mild abdominal discomfort at times. He's had no swelling, tenderness, numbness or tingling. His appetite is good and he is drinking plenty of fluids. He states that he has lost 10-15 lbs in the last few months. 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 He has had no other issues with this. He also had superficial bladder cancer which was also treated and is no longer an issue. He wants to treated if possible.    ROS: All other 10 point review of systems is negative except for those issues mentioned above.   PAST MEDICAL HISTORY:   Past Medical History  Diagnosis Date  . Diverticulosis of colon (without mention of hemorrhage)   . Irritable bowel syndrome   . Personal history of malignant neoplasm of prostate     s/p  radioactive seed implants 2000  . Unspecified hereditary and idiopathic peripheral neuropathy   . Nonischemic cardiomyopathy cardiologist-  dr Aundra Dubin    EF 25%  per last echo  2012 and cardiologist note--  2007-  ef  40-45%;  2008- ef 35-40%;  2010- ef 30-35%;  2012- ef 25%  . Sinus bradycardia   . Hypertension   . Arthritis     hands  . Hyperlipidemia   . History of DVT of lower extremity     2002  ,   left leg  . History of adenomatous polyp of colon     2001  . History of basal cell carcinoma excision   . Anticoagulated on Coumadin   . Type 2 diabetes, diet controlled   . GERD (gastroesophageal reflux disease)   . LBBB (left bundle branch block)   . Wears glasses   . Wears hearing aid     BILATERAL  . Unsteady gait   . DDD (degenerative disc disease)     cervical and lumbar  . DJD (degenerative joint disease)     hips  . Lower urinary tract symptoms (LUTS)   . 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 Coronary artery disease     per cath 2002  non-obstructive cad  . Chronic systolic heart failure   . H/O fracture of nose     12-30-2013-  pt fell at home open nose bone fx   ALLERGIES: No Known Allergies    MEDICATIONS:  Current Outpatient Prescriptions on File Prior to Visit  Medication Sig  Dispense Refill  . bisacodyl (DULCOLAX) 10 MG suppository Place 1 suppository (10 mg total) rectally daily as needed for moderate constipation.  12 suppository  0  . carvedilol (COREG) 12.5 MG tablet Take 12.5 mg by mouth 2 (two) times daily with a meal.      . docusate sodium 100 MG CAPS Take 100 mg by mouth 2 (two) times daily.  60 capsule  0  . HYDROcodone-acetaminophen (NORCO/VICODIN) 5-325 MG per tablet Take 1 tablet by mouth every 4 (four) hours as needed for moderate pain.      Marland Kitchen insulin glargine (LANTUS) 100 UNIT/ML injection Inject 3 Units into the skin at bedtime.      . nitroGLYCERIN (NITROSTAT) 0.4 MG SL tablet Place 1 tablet (0.4 mg total) under the tongue every 5 (five) minutes as needed for chest pain.  90 tablet  3  . polyethylene glycol (MIRALAX /  GLYCOLAX) packet Take 17 g by mouth once as needed for mild constipation.       . pravastatin (PRAVACHOL) 40 MG tablet Take 40 mg by mouth every evening.       . pregabalin (LYRICA) 75 MG capsule Take 75 mg by mouth daily.      . ranitidine (ZANTAC) 150 MG tablet Take 150 mg by mouth 2 (two) times daily.      Marland Kitchen zolpidem (AMBIEN) 10 MG tablet Take 10 mg by mouth at bedtime as needed for sleep.      . [DISCONTINUED] Calcium Carbonate-Vitamin D (CALTRATE 600+D) 600-400 MG-UNIT per tablet Take 1 tablet by mouth daily.        No current facility-administered medications on file prior to visit.   PAST SURGICAL HISTORY Past Surgical History  Procedure Laterality Date  . Knee arthroscopy Left 1991  . Ercp with sphincterotomy and stone removal  05/ 1993  &  05-11-2006  . Total knee arthroplasty  10/05/2011    Procedure: TOTAL KNEE ARTHROPLASTY;  Surgeon: Gearlean Alf, MD;  Location: [facility];  Service: Orthopedics;  Laterality: Right;  . Transurethral resection of bladder tumor N/A 09/30/2012    Procedure: TRANSURETHRAL RESECTION OF BLADDER TUMOR (TURBT);  Surgeon: Franchot Gallo, MD;  Location: [facility];  Service: Urology;  Laterality: N/A;  WITH MITOMYCIN INSTILLATION   . Eus N/A 01/04/2014    Procedure: ESOPHAGEAL ENDOSCOPIC ULTRASOUND (EUS) RADIAL;  Surgeon: [name], MD;  [facility];  Service: Endoscopy;  Laterality: N/A;  . Radioactive prostate seed implants  01/ 2000  . Mohs surgery  03/ 2009    LEFT EAR BASAL CELL CARCINOMA  . Tonsillectomy  AS CHILD  . Cholecystectomy open  1985  . Appendectomy  child  . Inguinal hernia repair Bilateral 1998    recurrent RIH  w/ repair 06-21-2006  . Total knee arthroplasty Left 02-21-2007    AND 04-21-2007  CLOSED MANIPULATION  . Cataract extraction w/ intraocular lens implant Right 2004  . Lumbar spine surgery  X2  . Cardiac catheterization  06-14-2001  dr Darnell Level brodie    non-obstructive CAD/  LM 30%,  pLAD 50%,  mLAD 30%,  RCA  &   CFX  irregularites ,  global hypokinesis,  ef 40%  . Cardiovascular stress test  02-07-2007   dr Darnell Level brodie    negative for ischemia,  low risk myoview perfusion study  . Transthoracic echocardiogram  01-28-2011    moderate LVH/  ef 25%/  diffuse hypokinesis/  mild LAE/  mild TR/  trivial AR & MR  .  Cystoscopy w/ retrogrades Bilateral 03/09/2014    Procedure: CYSTOSCOPY WITH BILATERAL RETROGRADE PYELOGRAM BLADDER BX AND RENAL WASHINGS.;  Surgeon: Jorja Loa, MD;  Location: [facility];  Service: Urology;  Laterality: Bilateral;  . Eus N/A 04/05/2014    Procedure: UPPER ENDOSCOPIC ULTRASOUND (EUS) LINEAR;  Surgeon: [name], MD;  [facility];  Service: Endoscopy;  Laterality: N/A;  radial linear   FAMILY HISTORY: Family History  Problem Relation Age of Onset  . Throat cancer Brother   . Coronary artery disease Father     deceased   SOCIAL HISTORY:  reports that he quit smoking about 31 years ago. His smoking use included Cigarettes. He started smoking about 60 years ago. He has a 10 pack-year smoking history. He has never used smokeless tobacco. He reports that he does not drink alcohol or use illicit drugs.  PERFORMANCE STATUS: The patient's performance status is 0 - Asymptomatic  PHYSICAL EXAM: Most Recent Vital Signs: Blood pressure 125/52, pulse 62, temperature 98.4 F (36.9 C), temperature source Oral, resp. rate 18, height 5\' 9"  (1.753 m), weight 172 lb (78.019 kg). BP 125/52  Pulse 62  Temp(Src) 98.4 F (36.9 C) (Oral)  Resp 18  Ht 5\' 9"  (1.753 m)  Wt 172 lb (78.019 kg)  BMI 25.39 kg/m2  General Appearance:    Alert, cooperative, no distress, appears stated age  Head:    Normocephalic, without obvious abnormality, atraumatic  Eyes:    PERRL, conjunctiva/corneas clear, EOM's intact, fundi    benign, both eyes             Throat:   Lips, mucosa, and tongue normal; teeth and gums normal  Neck:   Supple, symmetrical, trachea  midline, no adenopathy;       thyroid:  No enlargement/tenderness/nodules; no carotid   bruit or JVD  Back:     Symmetric, curvature, ROM normal, no CVA tenderness  Lungs:     Clear to auscultation bilaterally, respirations unlabored  Chest wall:    No tenderness or deformity  Heart:    Regular rate and rhythm, S1 and S2 normal, no murmur, rub   or gallop  Abdomen:     Soft, nontender, bowel sounds active all four quadrants,    no masses, no organomegaly        Extremities:   Extremities normal, atraumatic, no cyanosis or edema  Pulses:   2+ and symmetric all extremities  Skin:   Skin color, texture, turgor normal, no rashes or lesions  Lymph nodes:   Cervical, supraclavicular, and axillary nodes normal  Neurologic:   CNII-XII intact. Normal strength, sensation and reflexes      throughout   LABORATORY DATA:  Results for orders placed in visit on 04/10/14 (from the past 48 hour(s))  CBC WITH DIFFERENTIAL ([place])     Status: Abnormal   Collection Time    04/10/14  8:14 AM      Result Value Ref Range   WBC 7.3  4.0 - 10.0 10e3/uL   RBC 3.56 (*) 4.20 - 5.70 10e6/uL   HGB 11.4 (*) 13.0 - 17.1 g/dL   HCT 34.0 (*) 38.7 - 49.9 %   MCV 96  82 - 98 fL   MCH 32.0  28.0 - 33.4 pg   MCHC 33.5  32.0 - 35.9 g/dL   RDW 13.0  11.1 - 15.7 %   Platelets 267  145 - 400 10e3/uL   NEUT# 5.5  1.5 - 6.5 10e3/uL  LYMPH# 1.1  0.9 - 3.3 10e3/uL   MONO# 0.6  0.1 - 0.9 10e3/uL   Eosinophils Absolute 0.1  0.0 - 0.5 10e3/uL   BASO# 0.0  0.0 - 0.2 10e3/uL   NEUT% 74.6  40.0 - 80.0 %   LYMPH% 15.6  14.0 - 48.0 %   MONO% 8.7  0.0 - 13.0 %   EOS% 0.7  0.0 - 7.0 %   BASO% 0.4  0.0 - 2.0 %     RADIOGRAPHY: No results found.     PATHOLOGY:   FINE NEEDLE ASPIRATION, ENDOSCOPIC, PANCREAS(SPECIMEN 1 OF 1 COLLECTED 04/05/14): MALIGNANT CELLS CONSISTENT WITH ADENOCARCINOMA.  ASSESSMENT/PLAN: Mr. Johanson is a very pleasant 80 yo male with recently diagnosed stage IV pancreatic cancer with mets to the  liver. He has some mild abdominal pain in his abdomen at times. His CA 19-9 last week was 318. He wants to be treated. His CBC today looks ok. We will wait and see what the rest of his labs show.  He will have a port-a-cath placed next week and will start chemo the next day.  We will treat him with Gemcitabine at a reduced dose, 4 weeks on 2 weeks off.  We stressed the importance of eating and maintaining his weight with chemo. All questions were answered. The patient knows to call the clinic with any problems, questions or concerns. We can certainly see the patient much sooner if necessary.  The patient was seen discussed with and also seen by Dr. Marin Olp and he is in agreement with the aforementioned.   Gary by Dr. Marin Olp:  ADDENDUM: I saw and examined the patient with Sarah.  He has metastatic disease.  He came in with a friend.  He is 67 years old. He has stage IV pancreatic cancer.  He knows that he is not curable. I told him that. I said that if treatment works, it may work in 1/5 patients.  He wants treatment. He wants to "stay alive".  I talked to him about single agent treatment. I think he would only tolerate gemcitabine. Again, I think response rate probably will be less than 20%.  I went over the side effects with him. He understands these. I said that his blood counts can go down. He may mimic need to be transfused. He may have fatigue. He may have some nausea. He may have some diarrhea. He understands this.  His IV access is not all that great. We probably need to get a Port-A-Cath in him as I suspect that he probably will need IV fluids at some point.  For now, I do not see a need for any additional studies.  Again, he knows that he is not curable.  We will try to get started next week.  He does have other comorbid conditions. If we find that his cancer is progressing, he would not be a candidate for any second line therapy. I would then  really try to push for hospice and quality of life.  He will come back to see [place] for his second week of treatment.  We spent a good 60 minutes with he and his friend. We answered all of his questions.

## 2014-04-11 ENCOUNTER — Telehealth: Payer: Self-pay | Admitting: *Deleted

## 2014-04-11 ENCOUNTER — Other Ambulatory Visit: Payer: Self-pay | Admitting: Nurse Practitioner

## 2014-04-11 DIAGNOSIS — E871 Hypo-osmolality and hyponatremia: Secondary | ICD-10-CM

## 2014-04-11 DIAGNOSIS — R7401 Elevation of levels of liver transaminase levels: Secondary | ICD-10-CM

## 2014-04-11 DIAGNOSIS — D649 Anemia, unspecified: Secondary | ICD-10-CM

## 2014-04-11 DIAGNOSIS — R740 Nonspecific elevation of levels of transaminase and lactic acid dehydrogenase [LDH]: Principal | ICD-10-CM

## 2014-04-11 DIAGNOSIS — E876 Hypokalemia: Secondary | ICD-10-CM

## 2014-04-11 NOTE — Telephone Encounter (Signed)
Pt residence contacted at [place] at [facility] in [place] unit, spoke with nurse and pt is to have multiple procures this week and wants to cancel appointment in coumadin clinic today. Pt was asked to have INR checked at residence or at next procedure, if not an inpatient can dose coumadin after reading is obtained.

## 2014-04-11 NOTE — Telephone Encounter (Signed)
Spoke with nurse at pt residence and was informed that pt no longer takes coumadin

## 2014-04-12 ENCOUNTER — Inpatient Hospital Stay (HOSPITAL_COMMUNITY): Admission: RE | Admit: 2014-04-12 | Payer: Medicare Other | Source: Ambulatory Visit

## 2014-04-12 ENCOUNTER — Encounter (HOSPITAL_COMMUNITY): Payer: Medicare Other | Admitting: Certified Registered Nurse Anesthetist

## 2014-04-12 ENCOUNTER — Encounter (HOSPITAL_COMMUNITY): Payer: Self-pay | Admitting: Gastroenterology

## 2014-04-12 ENCOUNTER — Encounter (HOSPITAL_COMMUNITY)
Admission: RE | Disposition: A | Discharge status: Home / Self Care | Payer: Self-pay | Source: Ambulatory Visit | Attending: Gastroenterology

## 2014-04-12 ENCOUNTER — Ambulatory Visit (HOSPITAL_COMMUNITY): Payer: Medicare Other | Admitting: Certified Registered Nurse Anesthetist

## 2014-04-12 ENCOUNTER — Ambulatory Visit (HOSPITAL_COMMUNITY)
Admission: RE | Admit: 2014-04-12 | Discharge: 2014-04-12 | Disposition: A | Discharge status: Home / Self Care | Payer: Medicare Other | Source: Ambulatory Visit | Attending: Gastroenterology | Admitting: Gastroenterology

## 2014-04-12 ENCOUNTER — Ambulatory Visit (HOSPITAL_COMMUNITY): Payer: Medicare Other

## 2014-04-12 DIAGNOSIS — Z85828 Personal history of other malignant neoplasm of skin: Secondary | ICD-10-CM | POA: Insufficient documentation

## 2014-04-12 DIAGNOSIS — Z860100 Personal history of colon polyps, unspecified: Secondary | ICD-10-CM | POA: Insufficient documentation

## 2014-04-12 DIAGNOSIS — M1610 Unilateral primary osteoarthritis, unspecified hip: Secondary | ICD-10-CM | POA: Insufficient documentation

## 2014-04-12 DIAGNOSIS — I2510 Atherosclerotic heart disease of native coronary artery without angina pectoris: Secondary | ICD-10-CM | POA: Diagnosis not present

## 2014-04-12 DIAGNOSIS — M5030 Other cervical disc degeneration, unspecified cervical region: Secondary | ICD-10-CM | POA: Diagnosis not present

## 2014-04-12 DIAGNOSIS — I447 Left bundle-branch block, unspecified: Secondary | ICD-10-CM | POA: Diagnosis not present

## 2014-04-12 DIAGNOSIS — I428 Other cardiomyopathies: Secondary | ICD-10-CM | POA: Diagnosis not present

## 2014-04-12 DIAGNOSIS — E785 Hyperlipidemia, unspecified: Secondary | ICD-10-CM | POA: Diagnosis not present

## 2014-04-12 DIAGNOSIS — Z8551 Personal history of malignant neoplasm of bladder: Secondary | ICD-10-CM | POA: Diagnosis not present

## 2014-04-12 DIAGNOSIS — M51379 Other intervertebral disc degeneration, lumbosacral region without mention of lumbar back pain or lower extremity pain: Secondary | ICD-10-CM | POA: Insufficient documentation

## 2014-04-12 DIAGNOSIS — I498 Other specified cardiac arrhythmias: Secondary | ICD-10-CM | POA: Insufficient documentation

## 2014-04-12 DIAGNOSIS — M169 Osteoarthritis of hip, unspecified: Secondary | ICD-10-CM | POA: Diagnosis not present

## 2014-04-12 DIAGNOSIS — M5137 Other intervertebral disc degeneration, lumbosacral region: Secondary | ICD-10-CM | POA: Diagnosis not present

## 2014-04-12 DIAGNOSIS — C250 Malignant neoplasm of head of pancreas: Secondary | ICD-10-CM

## 2014-04-12 DIAGNOSIS — K589 Irritable bowel syndrome without diarrhea: Secondary | ICD-10-CM | POA: Insufficient documentation

## 2014-04-12 DIAGNOSIS — Z79899 Other long term (current) drug therapy: Secondary | ICD-10-CM | POA: Diagnosis not present

## 2014-04-12 DIAGNOSIS — E119 Type 2 diabetes mellitus without complications: Secondary | ICD-10-CM | POA: Insufficient documentation

## 2014-04-12 DIAGNOSIS — C259 Malignant neoplasm of pancreas, unspecified: Secondary | ICD-10-CM | POA: Insufficient documentation

## 2014-04-12 DIAGNOSIS — Z86718 Personal history of other venous thrombosis and embolism: Secondary | ICD-10-CM | POA: Insufficient documentation

## 2014-04-12 DIAGNOSIS — I10 Essential (primary) hypertension: Secondary | ICD-10-CM | POA: Diagnosis not present

## 2014-04-12 DIAGNOSIS — I509 Heart failure, unspecified: Secondary | ICD-10-CM | POA: Insufficient documentation

## 2014-04-12 DIAGNOSIS — K219 Gastro-esophageal reflux disease without esophagitis: Secondary | ICD-10-CM | POA: Diagnosis not present

## 2014-04-12 DIAGNOSIS — I5022 Chronic systolic (congestive) heart failure: Secondary | ICD-10-CM | POA: Insufficient documentation

## 2014-04-12 DIAGNOSIS — Z87891 Personal history of nicotine dependence: Secondary | ICD-10-CM | POA: Diagnosis not present

## 2014-04-12 DIAGNOSIS — G609 Hereditary and idiopathic neuropathy, unspecified: Secondary | ICD-10-CM | POA: Insufficient documentation

## 2014-04-12 DIAGNOSIS — R269 Unspecified abnormalities of gait and mobility: Secondary | ICD-10-CM | POA: Insufficient documentation

## 2014-04-12 DIAGNOSIS — K838 Other specified diseases of biliary tract: Secondary | ICD-10-CM | POA: Diagnosis not present

## 2014-04-12 DIAGNOSIS — M19049 Primary osteoarthritis, unspecified hand: Secondary | ICD-10-CM | POA: Insufficient documentation

## 2014-04-12 HISTORY — PX: BILIARY STENT PLACEMENT: SHX5538

## 2014-04-12 HISTORY — PX: ENDOSCOPIC RETROGRADE CHOLANGIOPANCREATOGRAPHY (ERCP) WITH PROPOFOL: SHX5810

## 2014-04-12 LAB — GLUCOSE, CAPILLARY: Glucose-Capillary: 180 mg/dL — ABNORMAL HIGH (ref 70–99)

## 2014-04-12 SURGERY — ENDOSCOPIC RETROGRADE CHOLANGIOPANCREATOGRAPHY (ERCP) WITH PROPOFOL
Anesthesia: Monitor Anesthesia Care

## 2014-04-12 MED ORDER — FENTANYL CITRATE 0.05 MG/ML IJ SOLN
INTRAMUSCULAR | Status: DC | PRN
  Administered 2014-04-12 (×2): 50 ug via INTRAVENOUS

## 2014-04-12 MED ORDER — SODIUM CHLORIDE 0.9 % IV SOLN
INTRAVENOUS | Status: DC | PRN
  Administered 2014-04-12 (×2)

## 2014-04-12 MED ORDER — SODIUM CHLORIDE 0.9 % IV SOLN: INTRAVENOUS | Status: DC

## 2014-04-12 MED ORDER — EPHEDRINE SULFATE 50 MG/ML IJ SOLN
INTRAMUSCULAR | Status: DC | PRN
  Administered 2014-04-12: 10 mg via INTRAVENOUS
  Administered 2014-04-12: 5 mg via INTRAVENOUS

## 2014-04-12 MED ORDER — FENTANYL CITRATE 0.05 MG/ML IJ SOLN
INTRAMUSCULAR | Status: AC
  Filled 2014-04-12: qty 2

## 2014-04-12 MED ORDER — LACTATED RINGERS IV SOLN
INTRAVENOUS | Status: DC
  Administered 2014-04-12: 1000 mL via INTRAVENOUS

## 2014-04-12 MED ORDER — INDOMETHACIN 50 MG RE SUPP
100.0000 mg | Freq: Once | RECTAL | Status: AC
  Administered 2014-04-12: 100 mg via RECTAL
  Filled 2014-04-12: qty 2

## 2014-04-12 MED ORDER — GLYCOPYRROLATE 0.2 MG/ML IJ SOLN
INTRAMUSCULAR | Status: DC | PRN
  Administered 2014-04-12: 0.2 mg via INTRAVENOUS

## 2014-04-12 MED ORDER — ONDANSETRON HCL 4 MG/2ML IJ SOLN
INTRAMUSCULAR | Status: AC
  Filled 2014-04-12: qty 2

## 2014-04-12 MED ORDER — SUCCINYLCHOLINE CHLORIDE 20 MG/ML IJ SOLN
INTRAMUSCULAR | Status: DC | PRN
  Administered 2014-04-12: 100 mg via INTRAVENOUS

## 2014-04-12 MED ORDER — PROPOFOL 10 MG/ML IV BOLUS
INTRAVENOUS | Status: AC
  Filled 2014-04-12: qty 20

## 2014-04-12 MED ORDER — LIDOCAINE HCL (CARDIAC) 20 MG/ML IV SOLN
INTRAVENOUS | Status: AC
  Filled 2014-04-12: qty 5

## 2014-04-12 MED ORDER — ONDANSETRON HCL 4 MG/2ML IJ SOLN
INTRAMUSCULAR | Status: DC | PRN
  Administered 2014-04-12: 4 mg via INTRAVENOUS

## 2014-04-12 MED ORDER — LIDOCAINE HCL (CARDIAC) 20 MG/ML IV SOLN
INTRAVENOUS | Status: DC | PRN
  Administered 2014-04-12: 80 mg via INTRAVENOUS

## 2014-04-12 MED ORDER — GLYCOPYRROLATE 0.2 MG/ML IJ SOLN
INTRAMUSCULAR | Status: AC
  Filled 2014-04-12: qty 1

## 2014-04-12 MED ORDER — PROPOFOL 10 MG/ML IV BOLUS
INTRAVENOUS | Status: DC | PRN
  Administered 2014-04-12: 120 mg via INTRAVENOUS

## 2014-04-12 NOTE — Anesthesia Preprocedure Evaluation (Signed)
Anesthesia Evaluation  Patient identified by MRN, date of birth, ID band Patient awake    Reviewed: Allergy & Precautions, H&P , NPO status , Patient's Chart, lab work & pertinent test results  History of Anesthesia Complications Negative for: history of anesthetic complications  Airway Mallampati: II TM Distance: >3 FB Neck ROM: Full    Dental no notable dental hx.    Pulmonary former smoker,  breath sounds clear to auscultation  Pulmonary exam normal       Cardiovascular hypertension, Pt. on medications + CAD, + Peripheral Vascular Disease and +CHF + dysrhythmias Rhythm:Regular Rate:Normal     Neuro/Psych negative neurological ROS  negative psych ROS   GI/Hepatic negative GI ROS, Neg liver ROS, GERD-  Medicated and Controlled,  Endo/Other  negative endocrine ROSdiabetes, Well Controlled  Renal/GU negative Renal ROS  negative genitourinary   Musculoskeletal negative musculoskeletal ROS (+) Arthritis -, Osteoarthritis,    Abdominal   Peds negative pediatric ROS (+)  Hematology negative hematology ROS (+) anemia ,   Anesthesia Other Findings   Reproductive/Obstetrics negative OB ROS                           Anesthesia Physical  Anesthesia Plan  ASA: IV  Anesthesia Plan: MAC and General   Post-op Pain Management:    Induction: Intravenous  Airway Management Planned: Nasal Cannula and Oral ETT  Additional Equipment:   Intra-op Plan:   Post-operative Plan: Extubation in OR  Informed Consent: I have reviewed the patients History and Physical, chart, labs and discussed the procedure including the risks, benefits and alternatives for the proposed anesthesia with the patient or authorized representative who has indicated his/her understanding and acceptance.   Dental advisory given  Plan Discussed with: CRNA  Anesthesia Plan Comments:         Anesthesia Quick  Evaluation

## 2014-04-12 NOTE — Transfer of Care (Signed)
Immediate Anesthesia Transfer of Care Note  Patient: [name] [name]  Procedure(s) Performed: Procedure(s) (LRB): ENDOSCOPIC RETROGRADE CHOLANGIOPANCREATOGRAPHY (ERCP) WITH PROPOFOL (N/A) BILIARY STENT PLACEMENT (N/A)  Patient Location: Endoscopy  Anesthesia Type: General  Level of Consciousness: sedated, patient cooperative and responds to stimulation  Airway & Oxygen Therapy: Patient Spontanous Breathing and Patient connected to face mask oxgen  Post-op Assessment: Report given to endoscopy RN and Post -op Vital signs reviewed and stable  Post vital signs: Reviewed and stable  Complications: No apparent anesthesia complications

## 2014-04-12 NOTE — H&P (View-Only) (Signed)
Subjective:    Patient ID: [name] [name], male    DOB: 03/26/1923, 80 y.o.   MRN: [id number]  HPI  80 year old white male for hospital followup. Patient admitted on 03/29/2014 with complaints of malaise, decreased appetite, generalized weakness and hematuria. CT scan scan of abdomen and pelvis showed 4.9 cm enhancing lesion adjacent to the pancreatic head/uncinate process worrisome for a primary pancreatic adenocarcinoma and suspected multifocal hepatic metastasis.  The possibility of pancreatic cancer was discussed at last office visit. Despite thorough discussion, patient changed his mind during hospitalization and expressed desire to pursue further treatment for his possible pancreatic cancer.  Palliative care consult evaluated his decision making capacity.  He underwent EUS by Dr. Ardis Hughs on 04/05/2014. Cytopathology showed malignant cells consistent with adenocarcinoma.  He does not have any family.  He is accompanied by two close friends.  [name] [name] is designated medical POA.  Referral to oncologist - Dr. Marin Olp already initiated by Dr. Ardis Hughs.  Review of Systems    mild weight loss, fatigue, poor appetite  Past Medical History  Diagnosis Date  . Diverticulosis of colon (without mention of hemorrhage)   . Irritable bowel syndrome   . Personal history of malignant neoplasm of prostate     s/p  radioactive seed implants 2000  . Unspecified hereditary and idiopathic peripheral neuropathy   . Nonischemic cardiomyopathy cardiologist-  dr Aundra Dubin    EF 25%  per last echo 2012 and cardiologist note--  2007-  ef  40-45%;  2008- ef 35-40%;  2010- ef 30-35%;  2012- ef 25%  . Sinus bradycardia   . Hypertension   . Arthritis     hands  . Hyperlipidemia   . History of DVT of lower extremity     2002  ,   left leg  . History of adenomatous polyp of colon     2001  . History of basal cell carcinoma excision   . Anticoagulated on Coumadin   . Type 2 diabetes, diet controlled   . GERD  (gastroesophageal reflux disease)   . LBBB (left bundle branch block)   . Wears glasses   . Wears hearing aid     BILATERAL  . Unsteady gait   . DDD (degenerative disc disease)     cervical and lumbar  . DJD (degenerative joint disease)     hips  . Lower urinary tract symptoms (LUTS)   . 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 Coronary artery disease     per cath 2002  non-obstructive cad  . Chronic systolic heart failure   . H/O fracture of nose     12-30-2013-  pt fell at home open nose bone fx    History   Social History  . Marital Status: Widowed    Spouse Name: N/A    Number of Children: 0  . Years of Education: N/A   Occupational History  .     Social History Main Topics  . Smoking status: Former Smoker -- 1.00 packs/day for 10 years    Types: Cigarettes    Quit date: 07/20/1985  . Smokeless tobacco: Never Used  . Alcohol Use: No  . Drug Use: No  . Sexual Activity: Not on file   Other Topics Concern  . Not on file   Social History Narrative   Married, no children.     Past Surgical History  Procedure  Laterality Date  . Knee arthroscopy Left 1991  . Ercp with sphincterotomy and stone removal  05/ 1993  &  05-11-2006  . Total knee arthroplasty  10/05/2011    Procedure: TOTAL KNEE ARTHROPLASTY;  Surgeon: Gearlean Alf, MD;  [facility]: [facility];  Service: Orthopedics;  Laterality: Right;  . Transurethral resection of bladder tumor N/A 09/30/2012    Procedure: TRANSURETHRAL RESECTION OF BLADDER TUMOR (TURBT);  Surgeon: Franchot Gallo, MD;  [facility]: [facility];  Service: Urology;  Laterality: N/A;  WITH MITOMYCIN INSTILLATION   . Eus N/A 01/04/2014    Procedure: ESOPHAGEAL ENDOSCOPIC ULTRASOUND (EUS) RADIAL;  Surgeon: [name], MD;  [facility];  Service: Endoscopy;  Laterality: N/A;  . Radioactive prostate seed implants  01/ 2000  . Mohs surgery  03/ 2009     LEFT EAR BASAL CELL CARCINOMA  . Tonsillectomy  AS CHILD  . Cholecystectomy open  1985  . Appendectomy  child  . Inguinal hernia repair Bilateral 1998    recurrent RIH  w/ repair 06-21-2006  . Total knee arthroplasty Left 02-21-2007    AND 04-21-2007  CLOSED MANIPULATION  . Cataract extraction w/ intraocular lens implant Right 2004  . Lumbar spine surgery  X2  . Cardiac catheterization  06-14-2001  dr Darnell Level brodie    non-obstructive CAD/  LM 30%,  pLAD 50%,  mLAD 30%,  RCA  &  CFX  irregularites ,  global hypokinesis,  ef 40%  . Cardiovascular stress test  02-07-2007   dr Darnell Level brodie    negative for ischemia,  low risk myoview perfusion study  . Transthoracic echocardiogram  01-28-2011    moderate LVH/  ef 25%/  diffuse hypokinesis/  mild LAE/  mild TR/  trivial AR & MR  . Cystoscopy w/ retrogrades Bilateral 03/09/2014    Procedure: CYSTOSCOPY WITH BILATERAL RETROGRADE PYELOGRAM BLADDER BX AND RENAL WASHINGS.;  Surgeon: Jorja Loa, MD;  Location[facility];  Service: Urology;  Laterality: Bilateral;  . Eus N/A 04/05/2014    Procedure: UPPER ENDOSCOPIC ULTRASOUND (EUS) LINEAR;  Surgeon: [name], MD;  [facility];  Service: Endoscopy;  Laterality: N/A;  radial linear    Family History  Problem Relation Age of Onset  . Throat cancer Brother   . Coronary artery disease Father     deceased    No Known Allergies  Current Outpatient Prescriptions on File Prior to Visit  Medication Sig Dispense Refill  . bisacodyl (DULCOLAX) 10 MG suppository Place 1 suppository (10 mg total) rectally daily as needed for moderate constipation.  12 suppository  0  . carvedilol (COREG) 12.5 MG tablet Take 12.5 mg by mouth 2 (two) times daily with a meal.      . docusate sodium 100 MG CAPS Take 100 mg by mouth 2 (two) times daily.  60 capsule  0  . fish oil-omega-3 fatty acids 1000 MG capsule Take 1 g by mouth daily.      . nitroGLYCERIN (NITROSTAT) 0.4 MG SL  tablet Place 1 tablet (0.4 mg total) under the tongue every 5 (five) minutes as needed for chest pain.  90 tablet  3  . polyethylene glycol (MIRALAX / GLYCOLAX) packet Take 17 g by mouth once as needed for mild constipation.       . pravastatin (PRAVACHOL) 40 MG tablet Take 40 mg by mouth every evening.       . pregabalin (LYRICA) 75 MG capsule Take 75 mg by mouth daily.      Marland Kitchen  ranitidine (ZANTAC) 150 MG tablet Take 150 mg by mouth 2 (two) times daily.      Marland Kitchen zolpidem (AMBIEN) 10 MG tablet Take 10 mg by mouth at bedtime as needed for sleep.      . [DISCONTINUED] Calcium Carbonate-Vitamin D (CALTRATE 600+D) 600-400 MG-UNIT per tablet Take 1 tablet by mouth daily.        No current facility-administered medications on file prior to visit.    BP 120/62  Pulse 68  Temp(Src) 98.9 F (37.2 C) (Oral)  Wt 172 lb (78.019 kg)    Objective:   Physical Exam  Constitutional: He is oriented to person, place, and time. No distress.  Frail 80 y/o white male  HENT:  Head: Normocephalic and atraumatic.  Bilateral hearing aids  Cardiovascular: Normal rate, regular rhythm and normal heart sounds.   Pulmonary/Chest: Effort normal and breath sounds normal. He has no wheezes.  Abdominal: Soft. Bowel sounds are normal. There is no tenderness.  Musculoskeletal: He exhibits no edema.  Neurological: He is oriented to person, place, and time.  Ambulates with walker  Skin: Skin is warm and dry.  Psychiatric: He has a normal mood and affect. His behavior is normal.          Assessment & Plan:

## 2014-04-12 NOTE — Discharge Instructions (Signed)

## 2014-04-12 NOTE — Interval H&P Note (Signed)
History and Physical Interval Note:  04/12/2014 8:31 AM  [name] [name]  has presented today for surgery, with the diagnosis of Malignant neoplasm of pancreas, unspecified location of malignancy [157.9]  The various methods of treatment have been discussed with the patient and family. After consideration of risks, benefits and other options for treatment, the patient has consented to  Procedure(s) with comments: ENDOSCOPIC RETROGRADE CHOLANGIOPANCREATOGRAPHY (ERCP) WITH PROPOFOL (N/A) - metal stent BILIARY STENT PLACEMENT (N/A) as a surgical intervention .  The patient's history has been reviewed, patient examined, no change in status, stable for surgery.  I have reviewed the patient's chart and labs.  Questions were answered to the patient's satisfaction.     [name]

## 2014-04-12 NOTE — Op Note (Signed)
[facility] [place], [id number]   ERCP PROCEDURE REPORT        EXAM DATE: 04/12/2014  PATIENT NAME:          [name] [name], [name] [name]          MR #: [id number] BIRTHDATE:       01-28-1923     VISIT #:     [id number] ATTENDING:     [name], MD     STATUS:     outpatient  INDICATIONS:  The patient is a 80 yr old male here for an ERCP due to known pancreatic cancer, dilated bile ducts, elevated liver tests. PROCEDURE PERFORMED:     ERCP with stent placement MEDICATIONS:     Per Anesthesia  CONSENT: The patient understands the risks and benefits of the procedure and understands that these risks include, but are not limited to: sedation, allergic reaction, infection, perforation and/or bleeding. Alternative means of evaluation and treatment include, among others: physical exam, x-rays, and/or surgical intervention. The patient elects to proceed with this endoscopic procedure.  DESCRIPTION OF PROCEDURE: During intra-op preparation period all mechanical & medical equipment was checked for proper function. Hand hygiene and appropriate measures for infection prevention was taken. After the risks, benefits and alternatives of the procedure were thoroughly explained, Informed was verified, confirmed and timeout was successfully executed by the treatment team. With the patient in left semi-prone position, medications were administered intravenously.The Pentax Ercp Scope [id number] was passed from the mouth into the esophagus and further advanced from the esophagus into the stomach. From stomach scope was directed to the second portion of the duodenum.  Major papilla was aligned with the duodenoscope. The scope position was confirmed fluoroscopically. Rest of the findings/therapeutics are given below. The scope was then completely withdrawn from the patient and the procedure completed. The pulse, BP, and O2 saturation were monitored and documented by the  physician and the nursing staff throughout the entire procedure. The patient was cared for as planned according to standard protocol. The patient was then discharged to recovery in stable condition and with appropriate post procedure care.  The 44 Autotome over a .035 hydrawire was used to cannulate the bile duct and contrast was injected. This showed distal CBD stricture (2cm long) with very dilated intra and extrahepatic bile ducts proximally.  Cystic duct stump did not opacify.  There were no clear filling defects in the biliary tree.  A 6cm long, 80mm diameter uncovered metal stent was placed in good position bridging the stricture. The most distal 1cm of the stent extended into the duodenum. Follow placement of the stent there was immediate delivery of darker than usual bile.  The main pancreatic duct was never cannulated with wire or injected with dye.    ADVERSE EVENT:     none  IMPRESSIONS:     Distal CBD stricture (from previosly known pancreatic adenocarcinoma, treated with placement of 6cm long 65mm diameter uncovered metal stent.  RECOMMENDATIONS:     Follow clinically    ___________________________________ [name], MD eSigned:  [name], MD 04/12/2014 11:03 AM   cc: Freddie Apley, DO; Burney Gauze, MD

## 2014-04-13 ENCOUNTER — Encounter (HOSPITAL_COMMUNITY): Payer: Self-pay | Admitting: Gastroenterology

## 2014-04-13 ENCOUNTER — Other Ambulatory Visit: Payer: Self-pay | Admitting: Radiology

## 2014-04-13 NOTE — Anesthesia Postprocedure Evaluation (Signed)
  Anesthesia Post-op Note  Patient: [name] [name]  Procedure(s) Performed: Procedure(s) (LRB): ENDOSCOPIC RETROGRADE CHOLANGIOPANCREATOGRAPHY (ERCP) WITH PROPOFOL (N/A) BILIARY STENT PLACEMENT (N/A)  Patient Location: PACU  Anesthesia Type: General  Level of Consciousness: awake and alert   Airway and Oxygen Therapy: Patient Spontanous Breathing  Post-op Pain: mild  Post-op Assessment: Post-op Vital signs reviewed, Patient's Cardiovascular Status Stable, Respiratory Function Stable, Patent Airway and No signs of Nausea or vomiting  Last Vitals:  Filed Vitals:   04/12/14 1211  BP: 160/63  Pulse: 57  Temp:   Resp: 16    Post-op Vital Signs: stable   Complications: No apparent anesthesia complications

## 2014-04-16 ENCOUNTER — Ambulatory Visit (HOSPITAL_COMMUNITY)
Admission: RE | Admit: 2014-04-16 | Discharge: 2014-04-16 | Disposition: A | Discharge status: Home / Self Care | Payer: Medicare Other | Source: Ambulatory Visit | Attending: Hematology & Oncology | Admitting: Hematology & Oncology

## 2014-04-16 ENCOUNTER — Ambulatory Visit: Payer: Medicare Other

## 2014-04-16 ENCOUNTER — Other Ambulatory Visit: Payer: Self-pay | Admitting: Hematology & Oncology

## 2014-04-16 ENCOUNTER — Encounter (HOSPITAL_COMMUNITY): Payer: Self-pay

## 2014-04-16 DIAGNOSIS — K219 Gastro-esophageal reflux disease without esophagitis: Secondary | ICD-10-CM | POA: Insufficient documentation

## 2014-04-16 DIAGNOSIS — C258 Malignant neoplasm of overlapping sites of pancreas: Secondary | ICD-10-CM

## 2014-04-16 DIAGNOSIS — Z7901 Long term (current) use of anticoagulants: Secondary | ICD-10-CM | POA: Diagnosis not present

## 2014-04-16 DIAGNOSIS — Z8551 Personal history of malignant neoplasm of bladder: Secondary | ICD-10-CM | POA: Insufficient documentation

## 2014-04-16 DIAGNOSIS — C259 Malignant neoplasm of pancreas, unspecified: Secondary | ICD-10-CM | POA: Diagnosis not present

## 2014-04-16 DIAGNOSIS — Z8546 Personal history of malignant neoplasm of prostate: Secondary | ICD-10-CM | POA: Insufficient documentation

## 2014-04-16 DIAGNOSIS — E119 Type 2 diabetes mellitus without complications: Secondary | ICD-10-CM | POA: Insufficient documentation

## 2014-04-16 DIAGNOSIS — Z87891 Personal history of nicotine dependence: Secondary | ICD-10-CM | POA: Diagnosis not present

## 2014-04-16 DIAGNOSIS — Z452 Encounter for adjustment and management of vascular access device: Secondary | ICD-10-CM | POA: Insufficient documentation

## 2014-04-16 DIAGNOSIS — Z794 Long term (current) use of insulin: Secondary | ICD-10-CM | POA: Insufficient documentation

## 2014-04-16 DIAGNOSIS — Z79899 Other long term (current) drug therapy: Secondary | ICD-10-CM | POA: Insufficient documentation

## 2014-04-16 DIAGNOSIS — I5022 Chronic systolic (congestive) heart failure: Secondary | ICD-10-CM | POA: Insufficient documentation

## 2014-04-16 DIAGNOSIS — C257 Malignant neoplasm of other parts of pancreas: Secondary | ICD-10-CM

## 2014-04-16 DIAGNOSIS — I428 Other cardiomyopathies: Secondary | ICD-10-CM | POA: Insufficient documentation

## 2014-04-16 DIAGNOSIS — I10 Essential (primary) hypertension: Secondary | ICD-10-CM | POA: Insufficient documentation

## 2014-04-16 LAB — CBC WITH DIFFERENTIAL/PLATELET
BASOS ABS: 0 10*3/uL (ref 0.0–0.1)
Basophils Relative: 0 % (ref 0–1)
EOS ABS: 0 10*3/uL (ref 0.0–0.7)
Eosinophils Relative: 0 % (ref 0–5)
HCT: 29.7 % — ABNORMAL LOW (ref 39.0–52.0)
Hemoglobin: 10 g/dL — ABNORMAL LOW (ref 13.0–17.0)
LYMPHS ABS: 1.3 10*3/uL (ref 0.7–4.0)
Lymphocytes Relative: 13 % (ref 12–46)
MCH: 31.1 pg (ref 26.0–34.0)
MCHC: 33.7 g/dL (ref 30.0–36.0)
MCV: 92.2 fL (ref 78.0–100.0)
Monocytes Absolute: 0.9 10*3/uL (ref 0.1–1.0)
Monocytes Relative: 9 % (ref 3–12)
Neutro Abs: 8.1 10*3/uL — ABNORMAL HIGH (ref 1.7–7.7)
Neutrophils Relative %: 78 % — ABNORMAL HIGH (ref 43–77)
PLATELETS: 232 10*3/uL (ref 150–400)
RBC: 3.22 MIL/uL — AB (ref 4.22–5.81)
RDW: 13.5 % (ref 11.5–15.5)
WBC: 10.3 10*3/uL (ref 4.0–10.5)

## 2014-04-16 LAB — APTT: aPTT: 38 seconds — ABNORMAL HIGH (ref 24–37)

## 2014-04-16 LAB — PROTIME-INR
INR: 1.35 (ref 0.00–1.49)
PROTHROMBIN TIME: 16.7 s — AB (ref 11.6–15.2)

## 2014-04-16 LAB — GLUCOSE, CAPILLARY: GLUCOSE-CAPILLARY: 140 mg/dL — AB (ref 70–99)

## 2014-04-16 MED ORDER — LIDOCAINE-PRILOCAINE 2.5-2.5 % EX CREA: 1.0000 "application " | TOPICAL_CREAM | CUTANEOUS | Status: DC | PRN

## 2014-04-16 MED ORDER — HEPARIN SOD (PORK) LOCK FLUSH 100 UNIT/ML IV SOLN
INTRAVENOUS | Status: AC
  Filled 2014-04-16: qty 5

## 2014-04-16 MED ORDER — HEPARIN SOD (PORK) LOCK FLUSH 100 UNIT/ML IV SOLN
INTRAVENOUS | Status: AC | PRN
  Administered 2014-04-16: 500 [IU]

## 2014-04-16 MED ORDER — FENTANYL CITRATE 0.05 MG/ML IJ SOLN
INTRAMUSCULAR | Status: AC | PRN
  Administered 2014-04-16: 25 ug via INTRAVENOUS

## 2014-04-16 MED ORDER — CEFAZOLIN SODIUM-DEXTROSE 2-3 GM-% IV SOLR
2.0000 g | INTRAVENOUS | Status: AC
  Administered 2014-04-16: 2 g via INTRAVENOUS

## 2014-04-16 MED ORDER — MIDAZOLAM HCL 2 MG/2ML IJ SOLN
INTRAMUSCULAR | Status: AC
  Filled 2014-04-16: qty 6

## 2014-04-16 MED ORDER — CEFAZOLIN SODIUM-DEXTROSE 2-3 GM-% IV SOLR
INTRAVENOUS | Status: AC
  Administered 2014-04-16: 2 g via INTRAVENOUS
  Filled 2014-04-16: qty 50

## 2014-04-16 MED ORDER — FENTANYL CITRATE 0.05 MG/ML IJ SOLN
INTRAMUSCULAR | Status: AC
  Filled 2014-04-16: qty 6

## 2014-04-16 MED ORDER — SODIUM CHLORIDE 0.9 % IV SOLN
INTRAVENOUS | Status: DC
  Administered 2014-04-16: 14:00:00 via INTRAVENOUS

## 2014-04-16 MED ORDER — LIDOCAINE-EPINEPHRINE (PF) 2 %-1:200000 IJ SOLN
INTRAMUSCULAR | Status: AC
  Filled 2014-04-16: qty 20

## 2014-04-16 MED ORDER — MIDAZOLAM HCL 2 MG/2ML IJ SOLN
INTRAMUSCULAR | Status: AC | PRN
  Administered 2014-04-16: 0.5 mg via INTRAVENOUS

## 2014-04-16 NOTE — Discharge Instructions (Signed)
Conscious Sedation °Sedation is the use of medicines to promote relaxation and relieve discomfort and anxiety. Conscious sedation is a type of sedation. Under conscious sedation you are less alert than normal but are still able to respond to instructions or stimulation. Conscious sedation is used during short medical and dental procedures. It is milder than deep sedation or general anesthesia and allows you to return to your regular activities sooner.  °LET YOUR HEALTH CARE PROVIDER KNOW ABOUT:  °· Any allergies you have. °· All medicines you are taking, including vitamins, herbs, eye drops, creams, and over-the-counter medicines. °· Use of steroids (by mouth or creams). °· Previous problems you or members of your family have had with the use of anesthetics. °· Any blood disorders you have. °· Previous surgeries you have had. °· Medical conditions you have. °· Possibility of pregnancy, if this applies. °· Use of cigarettes, alcohol, or illegal drugs. °RISKS AND COMPLICATIONS °Generally, this is a safe procedure. However, as with any procedure, problems can occur. Possible problems include: °· Oversedation. °· Trouble breathing on your own. You may need to have a breathing tube until you are awake and breathing on your own. °· Allergic reaction to any of the medicines used for the procedure. °BEFORE THE PROCEDURE °· You may have blood tests done. These tests can help show how well your kidneys and liver are working. They can also show how well your blood clots. °· A physical exam will be done.   °· Only take medicines as directed by your health care provider. You may need to stop taking medicines (such as blood thinners, aspirin, or nonsteroidal anti-inflammatory drugs) before the procedure.   °· Do not eat or drink at least 6 hours before the procedure or as directed by your health care provider. °· Arrange for a responsible adult, family member, or friend to take you home after the procedure. He or she should stay  with you for at least 24 hours after the procedure, until the medicine has worn off. °PROCEDURE  °· An intravenous (IV) catheter will be inserted into one of your veins. Medicine will be able to flow directly into your body through this catheter. You may be given medicine through this tube to help prevent pain and help you relax. °· The medical or dental procedure will be done. °AFTER THE PROCEDURE °· You will stay in a recovery area until the medicine has worn off. Your blood pressure and pulse will be checked.   °·  Depending on the procedure you had, you may be allowed to go home when you can tolerate liquids and your pain is under control. °Document Released: 03/31/2001 Document Revised: 07/11/2013 Document Reviewed: 03/13/2013 °[employer]® Patient Information ©2015 [employer]. This information is not intended to replace advice given to you by your health care provider. Make sure you discuss any questions you have with your health care provider. °Implanted Port Insertion, Care After °Refer to this sheet in the next few weeks. These instructions provide you with information on caring for yourself after your procedure. Your health care provider may also give you more specific instructions. Your treatment has been planned according to current medical practices, but problems sometimes occur. Call your health care provider if you have any problems or questions after your procedure. °WHAT TO EXPECT AFTER THE PROCEDURE °After your procedure, it is typical to have the following:  °· Discomfort at the port insertion site. Ice packs to the area will help. °· Bruising on the skin over the port. This will subside   in 3-4 days. °HOME CARE INSTRUCTIONS °· After your port is placed, you will get a manufacturer's information card. The card has information about your port. Keep this card with you at all times.   °· Know what kind of port you have. There are many types of ports available.   °· Wear a medical alert bracelet in  case of an emergency. This can help alert health care workers that you have a port.   °· The port can stay in for as long as your health care provider believes it is necessary.   °· A home health care nurse may give medicines and take care of the port.   °· You or a family member can get special training and directions for giving medicine and taking care of the port at home.   °SEEK MEDICAL CARE IF:  °· Your port does not flush or you are unable to get a blood return.   °· You have a fever or chills. °SEEK IMMEDIATE MEDICAL CARE IF: °· You have new fluid or pus coming from your incision.   °· You notice a bad smell coming from your incision site.   °· You have swelling, pain, or more redness at the incision or port site.   °· You have chest pain or shortness of breath. °Document Released: 04/26/2013 Document Revised: 07/11/2013 Document Reviewed: 04/26/2013 °[employer]® Patient Information ©2015 [employer]. This information is not intended to replace advice given to you by your health care provider. Make sure you discuss any questions you have with your health care provider. °Implanted Port Home Guide °An implanted port is a type of central line that is placed under the skin. Central lines are used to provide IV access when treatment or nutrition needs to be given through a person's veins. Implanted ports are used for long-term IV access. An implanted port may be placed because:  °· You need IV medicine that would be irritating to the small veins in your hands or arms.   °· You need long-term IV medicines, such as antibiotics.   °· You need IV nutrition for a long period.   °· You need frequent blood draws for lab tests.   °· You need dialysis.   °Implanted ports are usually placed in the chest area, but they can also be placed in the upper arm, the abdomen, or the leg. An implanted port has two main parts:  °· Reservoir. The reservoir is round and will appear as a small, raised area under your skin. The reservoir  is the part where a needle is inserted to give medicines or draw blood.   °· Catheter. The catheter is a thin, flexible tube that extends from the reservoir. The catheter is placed into a large vein. Medicine that is inserted into the reservoir goes into the catheter and then into the vein.   °HOW WILL I CARE FOR MY INCISION SITE? °Do not get the incision site wet. Bathe or shower as directed by your health care provider.  °HOW IS MY PORT ACCESSED? °Special steps must be taken to access the port:  °· Before the port is accessed, a numbing cream can be placed on the skin. This helps numb the skin over the port site.   °· Your health care provider uses a sterile technique to access the port. °· Your health care provider must put on a mask and sterile gloves. °· The skin over your port is cleaned carefully with an antiseptic and allowed to dry. °· The port is gently pinched between sterile gloves, and a needle is inserted into the port. °·   Only "non-coring" port needles should be used to access the port. Once the port is accessed, a blood return should be checked. This helps ensure that the port is in the vein and is not clogged.   °· If your port needs to remain accessed for a constant infusion, a clear (transparent) bandage will be placed over the needle site. The bandage and needle will need to be changed every week, or as directed by your health care provider.   °· Keep the bandage covering the needle clean and dry. Do not get it wet. Follow your health care provider's instructions on how to take a shower or bath while the port is accessed.   °· If your port does not need to stay accessed, no bandage is needed over the port.   °WHAT IS FLUSHING? °Flushing helps keep the port from getting clogged. Follow your health care provider's instructions on how and when to flush the port. Ports are usually flushed with saline solution or a medicine called heparin. The need for flushing will depend on how the port is used.   °· If the port is used for intermittent medicines or blood draws, the port will need to be flushed:   °· After medicines have been given.   °· After blood has been drawn.   °· As part of routine maintenance.   °· If a constant infusion is running, the port may not need to be flushed.   °HOW LONG WILL MY PORT STAY IMPLANTED? °The port can stay in for as long as your health care provider thinks it is needed. When it is time for the port to come out, surgery will be done to remove it. The procedure is similar to the one performed when the port was put in.  °WHEN SHOULD I SEEK IMMEDIATE MEDICAL CARE? °When you have an implanted port, you should seek immediate medical care if:  °· You notice a bad smell coming from the incision site.   °· You have swelling, redness, or drainage at the incision site.   °· You have more swelling or pain at the port site or the surrounding area.   °· You have a fever that is not controlled with medicine. °Document Released: 07/06/2005 Document Revised: 04/26/2013 Document Reviewed: 03/13/2013 °[employer]® Patient Information ©2015 [employer]. This information is not intended to replace advice given to you by your health care provider. Make sure you discuss any questions you have with your health care provider. ° °

## 2014-04-16 NOTE — H&P (Signed)
Chief Complaint: "I'm here for a port a cath"  Referring Physician(s): Ennever,Peter R  History of Present Illness: [name] [name] is a 80 y.o. male with history of recently diagnosed stage IV pancreatic carcinoma who presents today for port a cath placement for chemotherapy.  Past Medical History  Diagnosis Date  . Diverticulosis of colon (without mention of hemorrhage)   . Irritable bowel syndrome   . Personal history of malignant neoplasm of prostate     s/p  radioactive seed implants 2000  . Unspecified hereditary and idiopathic peripheral neuropathy   . Nonischemic cardiomyopathy cardiologist-  dr Aundra Dubin    EF 25%  per last echo 2012 and cardiologist note--  2007-  ef  40-45%;  2008- ef 35-40%;  2010- ef 30-35%;  2012- ef 25%  . Sinus bradycardia   . Hypertension   . Arthritis     hands  . Hyperlipidemia   . History of DVT of lower extremity     2002  ,   left leg  . History of adenomatous polyp of colon     2001  . History of basal cell carcinoma excision   . Anticoagulated on Coumadin   . Type 2 diabetes, diet controlled   . GERD (gastroesophageal reflux disease)   . LBBB (left bundle branch block)   . Wears glasses   . Wears hearing aid     BILATERAL  . Unsteady gait   . DDD (degenerative disc disease)     cervical and lumbar  . DJD (degenerative joint disease)     hips  . Lower urinary tract symptoms (LUTS)   . 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 Coronary artery disease     per cath 2002  non-obstructive cad  . Chronic systolic heart failure   . H/O fracture of nose     12-30-2013-  pt fell at home open nose bone fx    Past Surgical History  Procedure Laterality Date  . Knee arthroscopy Left 1991  . Ercp with sphincterotomy and stone removal  05/ 1993  &  05-11-2006  . Total knee arthroplasty  10/05/2011    Procedure: TOTAL KNEE ARTHROPLASTY;  Surgeon:  Gearlean Alf, MD;  Location: [facility];  Service: Orthopedics;  Laterality: Right;  . Transurethral resection of bladder tumor N/A 09/30/2012    Procedure: TRANSURETHRAL RESECTION OF BLADDER TUMOR (TURBT);  Surgeon: Franchot Gallo, MD;  Location: [facility];  Service: Urology;  Laterality: N/A;  WITH MITOMYCIN INSTILLATION   . Eus N/A 01/04/2014    Procedure: ESOPHAGEAL ENDOSCOPIC ULTRASOUND (EUS) RADIAL;  Surgeon: [name], MD;  Location: [facility];  Service: Endoscopy;  Laterality: N/A;  . Radioactive prostate seed implants  01/ 2000  . Mohs surgery  03/ 2009    LEFT EAR BASAL CELL CARCINOMA  . Tonsillectomy  AS CHILD  . Cholecystectomy open  1985  . Appendectomy  child  . Inguinal hernia repair Bilateral 1998    recurrent RIH  w/ repair 06-21-2006  . Total knee arthroplasty Left 02-21-2007    AND 04-21-2007  CLOSED MANIPULATION  . Cataract extraction w/ intraocular lens implant Right 2004  . Lumbar spine surgery  X2  . Cardiac catheterization  06-14-2001  dr Darnell Level brodie    non-obstructive CAD/  LM 30%,  pLAD 50%,  mLAD 30%,  RCA  &  CFX  irregularites ,  global hypokinesis,  ef 40%  . Cardiovascular stress test  02-07-2007   dr Darnell Level brodie    negative for ischemia,  low risk myoview perfusion study  . Transthoracic echocardiogram  01-28-2011    moderate LVH/  ef 25%/  diffuse hypokinesis/  mild LAE/  mild TR/  trivial AR & MR  . Cystoscopy w/ retrogrades Bilateral 03/09/2014    Procedure: CYSTOSCOPY WITH BILATERAL RETROGRADE PYELOGRAM BLADDER BX AND RENAL WASHINGS.;  Surgeon: Jorja Loa, MD;  Location: [facility];  Service: Urology;  Laterality: Bilateral;  . Eus N/A 04/05/2014    Procedure: UPPER ENDOSCOPIC ULTRASOUND (EUS) LINEAR;  Surgeon: [name], MD;  Location: [facility];  Service: Endoscopy;  Laterality: N/A;  radial linear  . Endoscopic retrograde cholangiopancreatography (ercp) with propofol N/A 04/12/2014    Procedure: ENDOSCOPIC  RETROGRADE CHOLANGIOPANCREATOGRAPHY (ERCP) WITH PROPOFOL;  Surgeon: [name], MD;  Location: [facility];  Service: Endoscopy;  Laterality: N/A;  metal stent  . Biliary stent placement N/A 04/12/2014    Procedure: BILIARY STENT PLACEMENT;  Surgeon: [name], MD;  Location: [facility];  Service: Endoscopy;  Laterality: N/A;    Allergies: Review of patient's allergies indicates no known allergies.  Medications: Prior to Admission medications   Medication Sig Start Date End Date Taking? Authorizing Provider  carvedilol (COREG) 12.5 MG tablet Take 12.5 mg by mouth 2 (two) times daily with a meal.   Yes Historical Provider, MD  docusate sodium 100 MG CAPS Take 100 mg by mouth 2 (two) times daily. 04/03/14  Yes Venetia Maxon Rama, MD  HYDROcodone-acetaminophen (NORCO/VICODIN) 5-325 MG per tablet Take 1 tablet by mouth every 4 (four) hours as needed for moderate pain.   Yes Historical Provider, MD  insulin glargine (LANTUS) 100 UNIT/ML injection Inject 3 Units into the skin at bedtime.   Yes Historical Provider, MD  Nutritional Supplements ([place]) LIQD Take by mouth every morning.   Yes Historical Provider, MD  pravastatin (PRAVACHOL) 40 MG tablet Take 40 mg by mouth every evening.    Yes Historical Provider, MD  pregabalin (LYRICA) 75 MG capsule Take 75 mg by mouth daily.   Yes Historical Provider, MD  ranitidine (ZANTAC) 150 MG tablet Take 150 mg by mouth 2 (two) times daily.   Yes Historical Provider, MD  zolpidem (AMBIEN) 10 MG tablet Take 10 mg by mouth at bedtime as needed for sleep.   Yes Historical Provider, MD  bisacodyl (DULCOLAX) 10 MG suppository Place 1 suppository (10 mg total) rectally daily as needed for moderate constipation. 04/03/14   Venetia Maxon Rama, MD  lidocaine-prilocaine (EMLA) cream Apply 1 application topically as needed. 04/16/14   Volanda Napoleon, MD  nitroGLYCERIN (NITROSTAT) 0.4 MG SL tablet Place 1 tablet (0.4 mg total) under the tongue every  5 (five) minutes as needed for chest pain. 11/22/13   Thompson Grayer, MD  polyethylene glycol (MIRALAX / GLYCOLAX) packet Take 17 g by mouth once as needed for mild constipation.     Historical Provider, MD    Family History  Problem Relation Age of Onset  . Throat cancer Brother   . Coronary artery disease Father     deceased    History   Social History  . Marital Status: Widowed    Spouse Name: N/A    Number of Children: 0  . Years of Education: N/A   Occupational History  .     Social History Main Topics  . Smoking status:  Former Smoker -- 1.00 packs/day for 10 years    Types: Cigarettes    Start date: 04/10/1954    Quit date: 09/10/1982  . Smokeless tobacco: Never Used     Comment: quit 32 years ago  . Alcohol Use: No  . Drug Use: No  . Sexual Activity: None   Other Topics Concern  . None   Social History Narrative   Married, no children.          Review of Systems  Constitutional: Positive for fatigue. Negative for fever and chills.  HENT: Positive for hearing loss.   Respiratory: Negative for cough and shortness of breath.   Cardiovascular: Negative for chest pain.  Gastrointestinal: Positive for abdominal pain. Negative for nausea, vomiting and blood in stool.  Genitourinary: Negative for hematuria and flank pain.  Musculoskeletal: Negative for back pain.  Neurological: Negative for headaches.    Vital Signs: BP 129/62  Pulse 61  Temp(Src) 98.6 F (37 C) (Oral)  Resp 18  Ht 5\' 9"  (1.753 m)  Wt 172 lb (78.019 kg)  BMI 25.39 kg/m2  SpO2 100%  Physical Exam  Constitutional:  Frail elderly WM in NAD  Cardiovascular: Normal rate.   occ ectopy noted  Pulmonary/Chest: Effort normal.  Few fine bibasilar crackles  Abdominal: Soft. Bowel sounds are normal. There is tenderness.  Musculoskeletal: Normal range of motion. He exhibits no edema.    Imaging: Dg Chest 2 View  03/30/2014   CLINICAL DATA:  Weakness.  Loss of appetite.  EXAM: CHEST  2  VIEW  COMPARISON:  03/01/2014.  FINDINGS: Mediastinum and hilar structures normal. Cardiomegaly. Pulmonary vascular prominence appear no focal pulmonary infiltrate. No pleural effusion or pneumothorax. Degenerative changes thoracic spine.  IMPRESSION: 1. Mild cardiomegaly with mild pulmonary vascular prominence. No evidence of overt pulmonary edema. 2. Mild bibasilar atelectasis.   Electronically Signed   By: Marcello Moores  Register   On: 03/30/2014 08:38   Mr Abdomen W Wo Contrast  03/31/2014   CLINICAL DATA:  Pancreatic mass tissues for potential primary pancreatic malignancy with liver metastasis. Generalized weakness. Failure to thrive.  EXAM: MRI ABDOMEN WITHOUT AND WITH CONTRAST  TECHNIQUE: Multiplanar multisequence MR imaging of the abdomen was performed both before and after the administration of intravenous contrast.  CONTRAST:  18 mL of MultiHance.  COMPARISON:  MRI of the abdomen 05/04/2013.  FINDINGS: As with the recent CT examination, there is abnormal soft tissue that is increasing in prominence in the region of the head and proximal body of the pancreas. This soft tissues relatively nondescript and difficult to discretely visualize, however, when compared to remote prior CT scan 05/10/2006, it is apparent that the pancreas has largely atrophied, and this soft tissue which generally looks like normal pancreatic tissue is grossly abnormal in the setting of prior severe pancreatic atrophy. This is amorphous in appearance and therefore difficult to discretely measure, however, this is estimated to measure approximately 4.4 x 3.2 cm (image 58 of series 1703). This encapsulates the distal aspect of the common bile duct which is markedly narrowed (accounting for the severe intra and extrahepatic biliary ductal dilatation), and also circumferentially encases the proximal superior mesenteric artery, as well as the distal superior mesenteric vein, distal splenic vein, splenoportal confluence and the proximal portal  vein, all of which appear mildly narrowed as they transition through this lesion. No definite surrounding lymphadenopathy.  Common bile duct measures up to 20 mm in the porta hepatis, and there is severe intrahepatic biliary ductal dilatation. Today's  study is limited by patient respiratory motion, particularly on the post-contrast images. This limits assessment for hepatic metastases. There are a couple of benign appearing hepatic lesions which are low signal intensity on T1 weighted images, high signal intensity on T2 weighted images, and do not enhance, compatible with simple cysts, in the anterior aspect of segment 2 and in segment 6. However, there are several other lesions which are poorly visualized on post-contrast images secondary to motion, better best displayed on the respiratory triggered T2 fat saturated sequences, including the previously described lesion in segment 8 of the liver near the dome which measures approximately 2.9 x 2.6 cm (image 6 of series 5). This lesion may demonstrate some low-level diffusion restriction.  Status post cholecystectomy. The appearance of the spleen, bilateral adrenal glands and bilateral kidneys is unremarkable.  IMPRESSION: 1. Subtle amorphous mass in the pancreatic head and proximal pancreatic body currently measuring 4.4 x 3.2 cm, causing distal common bile duct obstruction and demonstrating vascular involvement (SMA, SMV, splenic vein, splenic portal confluence and proximal portal vein), as above, highly concerning for primary pancreatic neoplasm. 2. Although there do appear to be multiple ill-defined hepatic lesions, concerning for metastatic disease, these were poorly evaluated on today's examination secondary to patient respiratory motion. Continued attention on any future followup studies is recommended, as metastatic disease to the liver is suspected. 3. Severe intra and extrahepatic biliary ductal dilatation.   Electronically Signed   By: Vinnie Langton M.D.    On: 03/31/2014 16:58   Ct Abdomen Pelvis W Contrast  03/29/2014   CLINICAL DATA:  Diffuse abdominal pain, nausea, bladder cancer  EXAM: CT ABDOMEN AND PELVIS WITH CONTRAST  TECHNIQUE: Multidetector CT imaging of the abdomen and pelvis was performed using the standard protocol following bolus administration of intravenous contrast.  CONTRAST:  141mL OMNIPAQUE IOHEXOL 300 MG/ML  SOLN  COMPARISON:  12/06/2013  FINDINGS: Mild patchy opacities at the bilateral lung bases, likely atelectasis.  2.5 x 3.2 cm enhancing lesion at the right hepatic dome (series 2/ image 6), suspicious for metastasis. Additional scattered enhancing lesions throughout the liver, suspicious for additional small metastases. Stable small cysts.  Spleen and adrenal glands are within normal limits.  Fatty atrophy of the pancreas. Progressive 2.9 x 4.9 cm enhancing lesion adjacent to the pancreatic head/uncinate process, previously approximately 2.8 x 2.9 cm, worrisome for primary pancreatic adenocarcinoma.  Tumor abuts and likely involves the SMA and SMV (series 3/image 14). Tumor also likely abuts the undersurface of the portal vein.  Status post cholecystectomy. Mild central intrahepatic ductal dilatation. Dilated common duct, measuring 1.8 cm proximally, tapering abruptly at the level of the mass (series 2/ image 24). Kidneys are within normal limits. No hydronephrosis.  No evidence of bowel obstruction. Colonic diverticulosis, without associated inflammatory changes.  Atherosclerotic calcifications of the abdominal aorta and branch vessels.  No abdominopelvic ascites.  Small upper abdominal nodes, including a 9 mm short axis node in the porta hepatis (series 2/ image 21).  Brachytherapy seeds in the prostate.  Bladder is within normal limits.  Postsurgical changes in the lower anterior abdominal wall.  Extensive degenerative changes in the visualized thoracolumbar spine.  IMPRESSION: 4.9 cm enhancing lesion adjacent to the pancreatic  head/uncinate process, worrisome for primary pancreatic adenocarcinoma, progressed.  Tumor likely involves the SMA/SMV and undersurface of the portal vein. 9 mm short axis node in the porta hepatitis.  Suspected multifocal hepatic metastases, including a 3.2 cm lesion in the right hepatic dome.  Additional ancillary findings  as above.   Electronically Signed   By: Julian Hy M.D.   On: 03/29/2014 21:05   Dg Ercp Biliary & Pancreatic Ducts  04/12/2014   CLINICAL DATA:  Distal CBD stricture, biliary dilatation  EXAM: ERCP distal CBD stent insertion  TECHNIQUE: Multiple spot images obtained with the fluoroscopic device and submitted for interpretation post-procedure.  COMPARISON:  03/30/2014  FINDINGS: Spot fluoroscopic intraoperative views during the ERCP demonstrate catheter access and guidewire insertion into the biliary system. Contrast injection confirms diffuse biliary dilatation of the intrahepatic ducts and common bile duct. Patient is status post cholecystectomy. Distal CBD stricture evident. This was crossed and treated with a metallic covered stent.  IMPRESSION: Distal CBD stricture/obstruction with proximal biliary dilatation.  Successful insertion of a metallic covered biliary stent across the distal CBD stricture/obstruction.  These images were submitted for radiologic interpretation only. Please see the procedural report for the amount of contrast and the fluoroscopy time utilized.   Electronically Signed   By: Daryll Brod M.D.   On: 04/12/2014 11:04    Labs: Lab Results  Component Value Date   WBC 10.3 04/16/2014   HCT 29.7* 04/16/2014   MCV 92.2 04/16/2014   PLT 232 04/16/2014   NA 136 04/10/2014   K 4.7 04/10/2014   CL 100 04/10/2014   CO2 26 04/10/2014   GLUCOSE 196* 04/10/2014   BUN 23 04/10/2014   CREATININE 1.00 04/10/2014   CALCIUM 9.6 04/10/2014   PROT 7.3 04/10/2014   ALBUMIN 4.0 04/10/2014   AST 17 04/10/2014   ALT 20 04/10/2014   ALKPHOS 146* 04/10/2014   BILITOT 0.5  04/10/2014   GFRNONAA 73* 03/31/2014   GFRAA 85* 03/31/2014   INR 1.40 03/29/2014   CA199 373.4* 04/10/2014    Assessment and Plan: [name] [name] is a 80 y.o. male with history of recently diagnosed stage IV pancreatic carcinoma who presents today for port a cath placement for chemotherapy. Details/risks of procedure d/w pt with his understanding and consent.              Signed: Autumn Messing 04/16/2014, 2:30 PM

## 2014-04-16 NOTE — Procedures (Signed)
Successful placement of right IJ approach port-a-cath with tip at the superior caval atrial junction. The catheter is ready for immediate use. No immediate post procedural complications. 

## 2014-04-17 ENCOUNTER — Other Ambulatory Visit: Payer: Self-pay | Admitting: *Deleted

## 2014-04-17 ENCOUNTER — Other Ambulatory Visit: Payer: Self-pay | Admitting: Oncology

## 2014-04-17 DIAGNOSIS — C679 Malignant neoplasm of bladder, unspecified: Secondary | ICD-10-CM

## 2014-04-17 DIAGNOSIS — C787 Secondary malignant neoplasm of liver and intrahepatic bile duct: Secondary | ICD-10-CM

## 2014-04-17 DIAGNOSIS — C259 Malignant neoplasm of pancreas, unspecified: Secondary | ICD-10-CM

## 2014-04-17 NOTE — Progress Notes (Signed)
Spoke with Adah Salvage from [facility], faxed in Rx for Emla Cream.

## 2014-04-18 ENCOUNTER — Other Ambulatory Visit (HOSPITAL_BASED_OUTPATIENT_CLINIC_OR_DEPARTMENT_OTHER): Payer: Medicare Other | Admitting: Lab

## 2014-04-18 ENCOUNTER — Ambulatory Visit (HOSPITAL_BASED_OUTPATIENT_CLINIC_OR_DEPARTMENT_OTHER): Payer: Medicare Other

## 2014-04-18 VITALS — BP 103/50 | HR 56 | Temp 98.5°F | Resp 18

## 2014-04-18 DIAGNOSIS — C250 Malignant neoplasm of head of pancreas: Secondary | ICD-10-CM

## 2014-04-18 DIAGNOSIS — C259 Malignant neoplasm of pancreas, unspecified: Secondary | ICD-10-CM

## 2014-04-18 DIAGNOSIS — C787 Secondary malignant neoplasm of liver and intrahepatic bile duct: Secondary | ICD-10-CM

## 2014-04-18 DIAGNOSIS — Z5111 Encounter for antineoplastic chemotherapy: Secondary | ICD-10-CM

## 2014-04-18 DIAGNOSIS — C679 Malignant neoplasm of bladder, unspecified: Secondary | ICD-10-CM

## 2014-04-18 LAB — CMP (CANCER CENTER ONLY)
ALK PHOS: 82 U/L (ref 26–84)
ALT(SGPT): 11 U/L (ref 10–47)
AST: 15 U/L (ref 11–38)
Albumin: 2.9 g/dL — ABNORMAL LOW (ref 3.3–5.5)
BILIRUBIN TOTAL: 0.6 mg/dL (ref 0.20–1.60)
BUN, Bld: 15 mg/dL (ref 7–22)
CO2: 24 mEq/L (ref 18–33)
Calcium: 8.7 mg/dL (ref 8.0–10.3)
Chloride: 100 mEq/L (ref 98–108)
Creat: 1.2 mg/dl (ref 0.6–1.2)
Glucose, Bld: 187 mg/dL — ABNORMAL HIGH (ref 73–118)
Potassium: 3.9 mEq/L (ref 3.3–4.7)
SODIUM: 139 meq/L (ref 128–145)
TOTAL PROTEIN: 6.8 g/dL (ref 6.4–8.1)

## 2014-04-18 LAB — CBC WITH DIFFERENTIAL (CANCER CENTER ONLY)
BASO#: 0 10*3/uL (ref 0.0–0.2)
BASO%: 0.3 % (ref 0.0–2.0)
EOS%: 1 % (ref 0.0–7.0)
Eosinophils Absolute: 0.1 10*3/uL (ref 0.0–0.5)
HCT: 28.1 % — ABNORMAL LOW (ref 38.7–49.9)
HGB: 9.5 g/dL — ABNORMAL LOW (ref 13.0–17.1)
LYMPH#: 0.8 10*3/uL — ABNORMAL LOW (ref 0.9–3.3)
LYMPH%: 12.6 % — ABNORMAL LOW (ref 14.0–48.0)
MCH: 32 pg (ref 28.0–33.4)
MCHC: 33.8 g/dL (ref 32.0–35.9)
MCV: 95 fL (ref 82–98)
MONO#: 0.6 10*3/uL (ref 0.1–0.9)
MONO%: 10.1 % (ref 0.0–13.0)
NEUT%: 76 % (ref 40.0–80.0)
NEUTROS ABS: 4.6 10*3/uL (ref 1.5–6.5)
Platelets: 205 10*3/uL (ref 145–400)
RBC: 2.97 10*6/uL — ABNORMAL LOW (ref 4.20–5.70)
RDW: 13 % (ref 11.1–15.7)
WBC: 6 10*3/uL (ref 4.0–10.0)

## 2014-04-18 LAB — CANCER ANTIGEN 19-9: CA 19 9: 478.4 U/mL — AB (ref <35.0)

## 2014-04-18 LAB — PREALBUMIN: Prealbumin: 8.7 mg/dL — ABNORMAL LOW (ref 17.0–34.0)

## 2014-04-18 MED ORDER — SODIUM CHLORIDE 0.9 % IV SOLN
1600.0000 mg | Freq: Once | INTRAVENOUS | Status: AC
  Administered 2014-04-18: 1600 mg via INTRAVENOUS
  Filled 2014-04-18: qty 42.08

## 2014-04-18 MED ORDER — SODIUM CHLORIDE 0.9 % IJ SOLN
10.0000 mL | INTRAMUSCULAR | Status: DC | PRN
  Administered 2014-04-18: 10 mL
  Filled 2014-04-18: qty 10

## 2014-04-18 MED ORDER — HEPARIN SOD (PORK) LOCK FLUSH 100 UNIT/ML IV SOLN
500.0000 [IU] | Freq: Once | INTRAVENOUS | Status: AC | PRN
  Administered 2014-04-18: 500 [IU]
  Filled 2014-04-18: qty 5

## 2014-04-18 MED ORDER — PROCHLORPERAZINE MALEATE 10 MG PO TABS
10.0000 mg | ORAL_TABLET | Freq: Once | ORAL | Status: AC
  Administered 2014-04-18: 10 mg via ORAL

## 2014-04-18 MED ORDER — PROCHLORPERAZINE MALEATE 10 MG PO TABS
ORAL_TABLET | ORAL | Status: AC
  Filled 2014-04-18: qty 1

## 2014-04-18 MED ORDER — PROCHLORPERAZINE MALEATE 10 MG PO TABS: 10.0000 mg | ORAL_TABLET | Freq: Four times a day (QID) | ORAL | Status: DC | PRN

## 2014-04-18 MED ORDER — ONDANSETRON HCL 8 MG PO TABS: 8.0000 mg | ORAL_TABLET | Freq: Two times a day (BID) | ORAL | Status: DC | PRN

## 2014-04-18 MED ORDER — SODIUM CHLORIDE 0.9 % IV SOLN
Freq: Once | INTRAVENOUS | Status: AC
  Administered 2014-04-18: 11:00:00 via INTRAVENOUS

## 2014-04-18 MED ORDER — LORAZEPAM 0.5 MG PO TABS: 0.5000 mg | ORAL_TABLET | Freq: Four times a day (QID) | ORAL | Status: DC | PRN

## 2014-04-18 NOTE — Patient Instructions (Addendum)
[facility]  Discharge Instructions for Patients Receiving Chemotherapy  Today you received the following chemotherapy agents : Gemzar  To help prevent nausea and vomiting after your treatment, we encourage you to take your nausea medication   1) Zofran (Ondansetron) Take 1 tablet (8 mg total) by mouth 2 (two) times daily as needed (Nausea or vomiting)  2)  Compazine Take 1 tablet (10 mg total) by mouth every 6 (six) hours as needed (Nausea or vomiting).  3) Ativan  (Lorazepam) Take 1 tablet (0.5 mg total) by mouth every 6 (six) hours as needed (Nausea or vomiting).   If you develop nausea and vomiting that is not controlled by your nausea medication, call the clinic. If it is after clinic hours your family physician or the after hours number for the clinic or go to the Emergency Department.  Take 1 tablet (0.5 mg total) by mouth every 6 (six) hours as needed (Nausea or vomiting).   BELOW ARE SYMPTOMS THAT SHOULD BE REPORTED IMMEDIATELY:  *FEVER GREATER THAN 100.5 F  *CHILLS WITH OR WITHOUT FEVER  NAUSEA AND VOMITING THAT IS NOT CONTROLLED WITH YOUR NAUSEA MEDICATION  *UNUSUAL SHORTNESS OF BREATH  *UNUSUAL BRUISING OR BLEEDING  TENDERNESS IN MOUTH AND THROAT WITH OR WITHOUT PRESENCE OF ULCERS  *URINARY PROBLEMS  *BOWEL PROBLEMS  UNUSUAL RASH Items with * indicate a potential emergency and should be followed up as soon as possible.  One of the nurses will contact you 24 hours after your treatment. Please let the nurse know about any problems that you may have experienced. Feel free to call the clinic [phone]   I have been informed and understand all the instructions given to me. I know to contact the clinic, my physician, or go to the Emergency Department if any problems should occur. I do not have any questions at this time, but understand that I may call the clinic during office hours   should I have any questions or need assistance in obtaining follow up  care.    __________________________________________  _____________  __________ Signature of Patient or Authorized Representative            Date                   Time    __________________________________________ Nurse's Signature  Gemcitabine injection What is this medicine? GEMCITABINE (jem SIT a been) is a chemotherapy drug. This medicine is used to treat many types of cancer like breast cancer, lung cancer, pancreatic cancer, and ovarian cancer. This medicine may be used for other purposes; ask your health care provider or pharmacist if you have questions. COMMON BRAND NAME(S): Gemzar What should I tell my health care provider before I take this medicine? They need to know if you have any of these conditions: -blood disorders -infection -kidney disease -liver disease -recent or ongoing radiation therapy -an unusual or allergic reaction to gemcitabine, other chemotherapy, other medicines, foods, dyes, or preservatives -pregnant or trying to get pregnant -breast-feeding How should I use this medicine? This drug is given as an infusion into a vein. It is administered in a hospital or clinic by a specially trained health care professional. Talk to your pediatrician regarding the use of this medicine in children. Special care may be needed. Overdosage: If you think you have taken too much of this medicine contact a poison control center or emergency room at once. NOTE: This medicine is only for you. Do not share this medicine with others. What  if I miss a dose? It is important not to miss your dose. Call your doctor or health care professional if you are unable to keep an appointment. What may interact with this medicine? -medicines to increase blood counts like filgrastim, pegfilgrastim, sargramostim -some other chemotherapy drugs like cisplatin -vaccines Talk to your doctor or health care professional before taking any of these  medicines: -acetaminophen -aspirin -ibuprofen -ketoprofen -naproxen This list may not describe all possible interactions. Give your health care provider a list of all the medicines, herbs, non-prescription drugs, or dietary supplements you use. Also tell them if you smoke, drink alcohol, or use illegal drugs. Some items may interact with your medicine. What should I watch for while using this medicine? Visit your doctor for checks on your progress. This drug may make you feel generally unwell. This is not uncommon, as chemotherapy can affect healthy cells as well as cancer cells. Report any side effects. Continue your course of treatment even though you feel ill unless your doctor tells you to stop. In some cases, you may be given additional medicines to help with side effects. Follow all directions for their use. Call your doctor or health care professional for advice if you get a fever, chills or sore throat, or other symptoms of a cold or flu. Do not treat yourself. This drug decreases your body's ability to fight infections. Try to avoid being around people who are sick. This medicine may increase your risk to bruise or bleed. Call your doctor or health care professional if you notice any unusual bleeding. Be careful brushing and flossing your teeth or using a toothpick because you may get an infection or bleed more easily. If you have any dental work done, tell your dentist you are receiving this medicine. Avoid taking products that contain aspirin, acetaminophen, ibuprofen, naproxen, or ketoprofen unless instructed by your doctor. These medicines may hide a fever. Women should inform their doctor if they wish to become pregnant or think they might be pregnant. There is a potential for serious side effects to an unborn child. Talk to your health care professional or pharmacist for more information. Do not breast-feed an infant while taking this medicine. What side effects may I notice from  receiving this medicine? Side effects that you should report to your doctor or health care professional as soon as possible: -allergic reactions like skin rash, itching or hives, swelling of the face, lips, or tongue -low blood counts - this medicine may decrease the number of white blood cells, red blood cells and platelets. You may be at increased risk for infections and bleeding. -signs of infection - fever or chills, cough, sore throat, pain or difficulty passing urine -signs of decreased platelets or bleeding - bruising, pinpoint red spots on the skin, black, tarry stools, blood in the urine -signs of decreased red blood cells - unusually weak or tired, fainting spells, lightheadedness -breathing problems -chest pain -mouth sores -nausea and vomiting -pain, swelling, redness at site where injected -pain, tingling, numbness in the hands or feet -stomach pain -swelling of ankles, feet, hands -unusual bleeding Side effects that usually do not require medical attention (report to your doctor or health care professional if they continue or are bothersome): -constipation -diarrhea -hair loss -loss of appetite -stomach upset This list may not describe all possible side effects. Call your doctor for medical advice about side effects. You may report side effects to FDA at 1-800-FDA-1088. Where should I keep my medicine? This drug is given in  a hospital or clinic and will not be stored at home. NOTE: This sheet is a summary. It may not cover all possible information. If you have questions about this medicine, talk to your doctor, pharmacist, or health care provider.  2015, [employer]. (2007-11-15 18:45:54)

## 2014-04-18 NOTE — Progress Notes (Signed)
right internal jugular approach power  injectable Port-A-Cath. The catheter is ready for immediate use.

## 2014-04-19 ENCOUNTER — Ambulatory Visit: Payer: Medicare Other | Admitting: Internal Medicine

## 2014-04-19 ENCOUNTER — Encounter: Payer: Self-pay | Admitting: Hematology & Oncology

## 2014-04-23 ENCOUNTER — Other Ambulatory Visit: Payer: Self-pay | Admitting: *Deleted

## 2014-04-23 MED ORDER — HYDROCODONE-ACETAMINOPHEN 10-325 MG PO TABS: ORAL_TABLET | ORAL | Status: DC

## 2014-04-23 NOTE — Telephone Encounter (Signed)
[facility] faxed Order for medication

## 2014-04-24 ENCOUNTER — Other Ambulatory Visit: Payer: Self-pay | Admitting: *Deleted

## 2014-04-24 DIAGNOSIS — C679 Malignant neoplasm of bladder, unspecified: Secondary | ICD-10-CM

## 2014-04-24 DIAGNOSIS — C787 Secondary malignant neoplasm of liver and intrahepatic bile duct: Secondary | ICD-10-CM

## 2014-04-24 DIAGNOSIS — C799 Secondary malignant neoplasm of unspecified site: Secondary | ICD-10-CM

## 2014-04-25 ENCOUNTER — Ambulatory Visit (HOSPITAL_BASED_OUTPATIENT_CLINIC_OR_DEPARTMENT_OTHER): Payer: Medicare Other | Admitting: Family

## 2014-04-25 ENCOUNTER — Other Ambulatory Visit (HOSPITAL_BASED_OUTPATIENT_CLINIC_OR_DEPARTMENT_OTHER): Payer: Medicare Other | Admitting: Lab

## 2014-04-25 ENCOUNTER — Ambulatory Visit (HOSPITAL_BASED_OUTPATIENT_CLINIC_OR_DEPARTMENT_OTHER): Payer: Medicare Other

## 2014-04-25 VITALS — BP 124/53 | HR 55 | Temp 98.0°F | Resp 14 | Wt 173.0 lb

## 2014-04-25 DIAGNOSIS — C250 Malignant neoplasm of head of pancreas: Secondary | ICD-10-CM

## 2014-04-25 DIAGNOSIS — R109 Unspecified abdominal pain: Secondary | ICD-10-CM | POA: Diagnosis not present

## 2014-04-25 DIAGNOSIS — C259 Malignant neoplasm of pancreas, unspecified: Secondary | ICD-10-CM

## 2014-04-25 DIAGNOSIS — Z8551 Personal history of malignant neoplasm of bladder: Secondary | ICD-10-CM

## 2014-04-25 DIAGNOSIS — C787 Secondary malignant neoplasm of liver and intrahepatic bile duct: Secondary | ICD-10-CM

## 2014-04-25 DIAGNOSIS — Z5111 Encounter for antineoplastic chemotherapy: Secondary | ICD-10-CM

## 2014-04-25 DIAGNOSIS — C799 Secondary malignant neoplasm of unspecified site: Secondary | ICD-10-CM

## 2014-04-25 DIAGNOSIS — C679 Malignant neoplasm of bladder, unspecified: Secondary | ICD-10-CM

## 2014-04-25 DIAGNOSIS — Z8546 Personal history of malignant neoplasm of prostate: Secondary | ICD-10-CM

## 2014-04-25 LAB — CANCER ANTIGEN 19-9: CA 19 9: 293.3 U/mL — AB (ref <35.0)

## 2014-04-25 LAB — CBC WITH DIFFERENTIAL (CANCER CENTER ONLY)
BASO#: 0 10*3/uL (ref 0.0–0.2)
BASO%: 0.2 % (ref 0.0–2.0)
EOS%: 0.2 % (ref 0.0–7.0)
Eosinophils Absolute: 0 10*3/uL (ref 0.0–0.5)
HEMATOCRIT: 28.5 % — AB (ref 38.7–49.9)
HGB: 9.4 g/dL — ABNORMAL LOW (ref 13.0–17.1)
LYMPH#: 0.8 10*3/uL — ABNORMAL LOW (ref 0.9–3.3)
LYMPH%: 13.5 % — ABNORMAL LOW (ref 14.0–48.0)
MCH: 31.5 pg (ref 28.0–33.4)
MCHC: 33 g/dL (ref 32.0–35.9)
MCV: 96 fL (ref 82–98)
MONO#: 0.5 10*3/uL (ref 0.1–0.9)
MONO%: 8 % (ref 0.0–13.0)
NEUT%: 78.1 % (ref 40.0–80.0)
NEUTROS ABS: 4.8 10*3/uL (ref 1.5–6.5)
Platelets: 195 10*3/uL (ref 145–400)
RBC: 2.98 10*6/uL — ABNORMAL LOW (ref 4.20–5.70)
RDW: 12.8 % (ref 11.1–15.7)
WBC: 6.1 10*3/uL (ref 4.0–10.0)

## 2014-04-25 LAB — CMP (CANCER CENTER ONLY)
ALBUMIN: 3.1 g/dL — AB (ref 3.3–5.5)
ALK PHOS: 81 U/L (ref 26–84)
ALT(SGPT): 24 U/L (ref 10–47)
AST: 24 U/L (ref 11–38)
BILIRUBIN TOTAL: 0.7 mg/dL (ref 0.20–1.60)
BUN: 14 mg/dL (ref 7–22)
CO2: 27 mEq/L (ref 18–33)
Calcium: 9 mg/dL (ref 8.0–10.3)
Chloride: 96 mEq/L — ABNORMAL LOW (ref 98–108)
Creat: 1.1 mg/dl (ref 0.6–1.2)
GLUCOSE: 165 mg/dL — AB (ref 73–118)
Potassium: 4.6 mEq/L (ref 3.3–4.7)
Sodium: 138 mEq/L (ref 128–145)
Total Protein: 6.8 g/dL (ref 6.4–8.1)

## 2014-04-25 LAB — PREALBUMIN: Prealbumin: 11.3 mg/dL — ABNORMAL LOW (ref 17.0–34.0)

## 2014-04-25 MED ORDER — SODIUM CHLORIDE 0.9 % IJ SOLN
10.0000 mL | INTRAMUSCULAR | Status: DC | PRN
  Administered 2014-04-25: 10 mL
  Filled 2014-04-25: qty 10

## 2014-04-25 MED ORDER — HEPARIN SOD (PORK) LOCK FLUSH 100 UNIT/ML IV SOLN
500.0000 [IU] | Freq: Once | INTRAVENOUS | Status: AC | PRN
  Administered 2014-04-25: 500 [IU]
  Filled 2014-04-25: qty 5

## 2014-04-25 MED ORDER — PROCHLORPERAZINE MALEATE 10 MG PO TABS
ORAL_TABLET | ORAL | Status: AC
  Filled 2014-04-25: qty 1

## 2014-04-25 MED ORDER — FUROSEMIDE 20 MG PO TABS: 20.0000 mg | ORAL_TABLET | Freq: Every day | ORAL | Status: DC

## 2014-04-25 MED ORDER — SODIUM CHLORIDE 0.9 % IV SOLN
Freq: Once | INTRAVENOUS | Status: AC
  Administered 2014-04-25: 13:00:00 via INTRAVENOUS

## 2014-04-25 MED ORDER — LACTULOSE 10 GM/15ML PO SOLN: 10.0000 g | Freq: Two times a day (BID) | ORAL | Status: DC | PRN

## 2014-04-25 MED ORDER — PROMETHAZINE HCL 25 MG/ML IJ SOLN
INTRAMUSCULAR | Status: AC
  Filled 2014-04-25: qty 1

## 2014-04-25 MED ORDER — PROCHLORPERAZINE MALEATE 10 MG PO TABS
10.0000 mg | ORAL_TABLET | Freq: Once | ORAL | Status: AC
  Administered 2014-04-25: 10 mg via ORAL

## 2014-04-25 MED ORDER — SODIUM CHLORIDE 0.9 % IV SOLN
1600.0000 mg | Freq: Once | INTRAVENOUS | Status: AC
  Administered 2014-04-25: 1600 mg via INTRAVENOUS
  Filled 2014-04-25: qty 42.08

## 2014-04-25 NOTE — Progress Notes (Signed)
[place]  Telephone:([phone]) [phone] Fax:(336) [phone]  ID: [name] [name] OB: 1922-09-14 MR#: [id number] HFW#:[id number] Patient Care Team: Doe-Hyun Kyra Searles, DO as PCP - General (Internal Medicine)  DIAGNOSIS: Stage IV pancreatic cancer with mets to liver  INTERVAL HISTORY: [name] [name] is here today for follow-up and treatment. He did well with his first chemo treatment. He has had some constipation afterwards. He is also worried about the swelling in his feet and ankles. He has +3 pitting in both ankles and feet. He denies fever, chills, n/v, cough, rash, headache, dizziness, SOB, chest pain, palpitations, diarrhea, blood in urine or stool. He's had no tenderness, numbness or tingling. His appetite is good and he is drinking plenty of fluids. 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 He has had no other issues with this. He also had superficial bladder cancer which was also treated and is no longer an issue. He wants to treated if possible. He has compression stockings but has not been wearing them. His CA 19-9 lin September was 478.   CURRENT TREATMENT: Gemcitabine  REVIEW OF SYSTEMS: All other 10 point review of systems is negative.   PAST MEDICAL HISTORY: Past Medical History  Diagnosis Date  . Diverticulosis of colon (without mention of hemorrhage)   . Irritable bowel syndrome   . Personal history of malignant neoplasm of prostate     s/p  radioactive seed implants 2000  . Unspecified hereditary and idiopathic peripheral neuropathy   . Nonischemic cardiomyopathy cardiologist-  dr Aundra Dubin    EF 25%  per last echo 2012 and cardiologist note--  2007-  ef  40-45%;  2008- ef 35-40%;  2010- ef 30-35%;  2012- ef 25%  . Sinus bradycardia   . Hypertension   . Arthritis     hands  . Hyperlipidemia   . History of DVT of lower extremity     2002  ,   left leg  . History of adenomatous polyp of colon     2001  . History of basal cell  carcinoma excision   . Anticoagulated on Coumadin   . Type 2 diabetes, diet controlled   . GERD (gastroesophageal reflux disease)   . LBBB (left bundle branch block)   . Wears glasses   . Wears hearing aid     BILATERAL  . Unsteady gait   . DDD (degenerative disc disease)     cervical and lumbar  . DJD (degenerative joint disease)     hips  . Lower urinary tract symptoms (LUTS)   . 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 Coronary artery disease     per cath 2002  non-obstructive cad  . Chronic systolic heart failure   . H/O fracture of nose     12-30-2013-  pt fell at home open nose bone fx   PAST SURGICAL HISTORY: Past Surgical History  Procedure Laterality Date  . Knee arthroscopy Left 1991  . Ercp with sphincterotomy and stone removal  05/ 1993  &  05-11-2006  . Total knee arthroplasty  10/05/2011    Procedure: TOTAL KNEE ARTHROPLASTY;  Surgeon: Gearlean Alf, MD;  Location: [facility];  Service: Orthopedics;  Laterality: Right;  . Transurethral resection of bladder tumor N/A 09/30/2012    Procedure: TRANSURETHRAL RESECTION OF BLADDER TUMOR (TURBT);  Surgeon: Franchot Gallo, MD;  Location:  [facility];  Service: Urology;  Laterality: N/A;  WITH MITOMYCIN INSTILLATION   . Eus N/A 01/04/2014    Procedure: ESOPHAGEAL ENDOSCOPIC ULTRASOUND (EUS) RADIAL;  Surgeon: [name], MD;  Location: [facility];  Service: Endoscopy;  Laterality: N/A;  . Radioactive prostate seed implants  01/ 2000  . Mohs surgery  03/ 2009    LEFT EAR BASAL CELL CARCINOMA  . Tonsillectomy  AS CHILD  . Cholecystectomy open  1985  . Appendectomy  child  . Inguinal hernia repair Bilateral 1998    recurrent RIH  w/ repair 06-21-2006  . Total knee arthroplasty Left 02-21-2007    AND 04-21-2007  CLOSED MANIPULATION  . Cataract extraction w/ intraocular lens implant Right 2004  . Lumbar spine surgery  X2  . Cardiac  catheterization  06-14-2001  dr Darnell Level brodie    non-obstructive CAD/  LM 30%,  pLAD 50%,  mLAD 30%,  RCA  &  CFX  irregularites ,  global hypokinesis,  ef 40%  . Cardiovascular stress test  02-07-2007   dr Darnell Level brodie    negative for ischemia,  low risk myoview perfusion study  . Transthoracic echocardiogram  01-28-2011    moderate LVH/  ef 25%/  diffuse hypokinesis/  mild LAE/  mild TR/  trivial AR & MR  . Cystoscopy w/ retrogrades Bilateral 03/09/2014    Procedure: CYSTOSCOPY WITH BILATERAL RETROGRADE PYELOGRAM BLADDER BX AND RENAL WASHINGS.;  Surgeon: Jorja Loa, MD;  Location: [facility];  Service: Urology;  Laterality: Bilateral;  . Eus N/A 04/05/2014    Procedure: UPPER ENDOSCOPIC ULTRASOUND (EUS) LINEAR;  Surgeon: [name], MD;  Location: [facility];  Service: Endoscopy;  Laterality: N/A;  radial linear  . Endoscopic retrograde cholangiopancreatography (ercp) with propofol N/A 04/12/2014    Procedure: ENDOSCOPIC RETROGRADE CHOLANGIOPANCREATOGRAPHY (ERCP) WITH PROPOFOL;  Surgeon: [name], MD;  Location: [facility];  Service: Endoscopy;  Laterality: N/A;  metal stent  . Biliary stent placement N/A 04/12/2014    Procedure: BILIARY STENT PLACEMENT;  Surgeon: [name], MD;  Location: [facility];  Service: Endoscopy;  Laterality: N/A;   FAMILY HISTORY Family History  Problem Relation Age of Onset  . Throat cancer Brother   . Coronary artery disease Father     deceased   GYNECOLOGIC HISTORY:  No LMP for male patient.   SOCIAL HISTORY:  History   Social History  . Marital Status: Widowed    Spouse Name: N/A    Number of Children: 0  . Years of Education: N/A   Occupational History  .     Social History Main Topics  . Smoking status: Former Smoker -- 1.00 packs/day for 10 years    Types: Cigarettes    Start date: 04/10/1954    Quit date: 09/10/1982  . Smokeless tobacco: Never Used     Comment: quit 32 years ago  . Alcohol  Use: No  . Drug Use: No  . Sexual Activity: Not on file   Other Topics Concern  . Not on file   Social History Narrative   Married, no children.    ADVANCED DIRECTIVES: <no information>  HEALTH MAINTENANCE: History  Substance Use Topics  . Smoking status: Former Smoker -- 1.00 packs/day for 10 years    Types: Cigarettes    Start date: 04/10/1954    Quit date: 09/10/1982  . Smokeless tobacco: Never Used     Comment: quit 32 years ago  . Alcohol Use: No  Colonoscopy: PAP: Bone density: Lipid panel:  No Known Allergies  Current Outpatient Prescriptions  Medication Sig Dispense Refill  . bisacodyl (DULCOLAX) 10 MG suppository Place 1 suppository (10 mg total) rectally daily as needed for moderate constipation.  12 suppository  0  . carvedilol (COREG) 12.5 MG tablet Take 12.5 mg by mouth 2 (two) times daily with a meal.      . docusate sodium 100 MG CAPS Take 100 mg by mouth 2 (two) times daily.  60 capsule  0  . furosemide (LASIX) 20 MG tablet Take 1 tablet (20 mg total) by mouth daily.  30 tablet  6  . HYDROcodone-acetaminophen (NORCO) 10-325 MG per tablet Take one tablet by mouth every 4 hours as needed for pain  180 tablet  0  . HYDROcodone-acetaminophen (NORCO/VICODIN) 5-325 MG per tablet Take 1 tablet by mouth every 4 (four) hours as needed for moderate pain.      Marland Kitchen insulin glargine (LANTUS) 100 UNIT/ML injection Inject 3 Units into the skin at bedtime.      Marland Kitchen lactulose (CHRONULAC) 10 GM/15ML solution Take 15 mLs (10 g total) by mouth 2 (two) times daily as needed for mild constipation or moderate constipation.  240 mL  1  . lidocaine-prilocaine (EMLA) cream Apply 1 application topically as needed.  30 g  0  . LORazepam (ATIVAN) 0.5 MG tablet Take 1 tablet (0.5 mg total) by mouth every 6 (six) hours as needed (Nausea or vomiting).  30 tablet  0  . nitroGLYCERIN (NITROSTAT) 0.4 MG SL tablet Place 1 tablet (0.4 mg total) under the tongue every 5 (five) minutes as needed for  chest pain.  90 tablet  3  . Nutritional Supplements (GLUCERNA ADVANCE SHAKE) LIQD Take by mouth every morning.      . ondansetron (ZOFRAN) 8 MG tablet Take 1 tablet (8 mg total) by mouth 2 (two) times daily as needed (Nausea or vomiting).  30 tablet  1  . polyethylene glycol (MIRALAX / GLYCOLAX) packet Take 17 g by mouth once as needed for mild constipation.       . pravastatin (PRAVACHOL) 40 MG tablet Take 40 mg by mouth every evening.       . pregabalin (LYRICA) 75 MG capsule Take 75 mg by mouth daily.      . prochlorperazine (COMPAZINE) 10 MG tablet Take 1 tablet (10 mg total) by mouth every 6 (six) hours as needed (Nausea or vomiting).  30 tablet  1  . ranitidine (ZANTAC) 150 MG tablet Take 150 mg by mouth 2 (two) times daily.      Marland Kitchen zolpidem (AMBIEN) 10 MG tablet Take 10 mg by mouth at bedtime as needed for sleep.      . [DISCONTINUED] Calcium Carbonate-Vitamin D (CALTRATE 600+D) 600-400 MG-UNIT per tablet Take 1 tablet by mouth daily.        No current facility-administered medications for this visit.   Facility-Administered Medications Ordered in Other Visits  Medication Dose Route Frequency Provider Last Rate Last Dose  . heparin lock flush 100 unit/mL  500 Units Intracatheter Once PRN Volanda Napoleon, MD      . sodium chloride 0.9 % injection 10 mL  10 mL Intracatheter PRN Volanda Napoleon, MD       OBJECTIVE: Filed Vitals:   04/25/14 1108  BP: 124/53  Pulse: 55  Temp: 98 F (36.7 C)  Resp: 14   Body mass index is 25.54 kg/(m^2). ECOG FS:1 - Symptomatic but completely ambulatory Ocular: Sclerae  unicteric, pupils equal, round and reactive to light Ear-nose-throat: Oropharynx clear, dentition fair Lymphatic: No cervical or supraclavicular adenopathy Lungs no rales or rhonchi, good excursion bilaterally Heart regular rate and rhythm, no murmur appreciated Abd soft, nontender, positive bowel sounds MSK no focal spinal tenderness, no joint edema Neuro: non-focal, well-oriented,  appropriate affect  LAB RESULTS: CMP     Component Value Date/Time   NA 138 04/25/2014 1043   NA 136 04/10/2014 0814   NA 137 04/10/2014   K 4.6 04/25/2014 1043   K 4.7 04/10/2014 0814   CL 96* 04/25/2014 1043   CL 100 04/10/2014 0814   CO2 27 04/25/2014 1043   CO2 26 04/10/2014 0814   GLUCOSE 165* 04/25/2014 1043   GLUCOSE 196* 04/10/2014 0814   BUN 14 04/25/2014 1043   BUN 23 04/10/2014 0814   BUN 22* 04/10/2014   CREATININE 1.1 04/25/2014 1043   CREATININE 1.00 04/10/2014 0814   CREATININE 1.1 04/10/2014   CALCIUM 9.0 04/25/2014 1043   CALCIUM 9.6 04/10/2014 0814   PROT 6.8 04/25/2014 1043   PROT 7.3 04/10/2014 0814   ALBUMIN 4.0 04/10/2014 0814   AST 24 04/25/2014 1043   AST 17 04/10/2014 0814   ALT 24 04/25/2014 1043   ALT 20 04/10/2014 0814   ALKPHOS 81 04/25/2014 1043   ALKPHOS 146* 04/10/2014 0814   BILITOT 0.70 04/25/2014 1043   BILITOT 0.5 04/10/2014 0814   GFRNONAA 73* 03/31/2014 0421   GFRAA 85* 03/31/2014 0421   No results found for this basename: SPEP, UPEP,  kappa and lambda light chains   Lab Results  Component Value Date   WBC 6.1 04/25/2014   NEUTROABS 4.8 04/25/2014   HGB 9.4* 04/25/2014   HCT 28.5* 04/25/2014   MCV 96 04/25/2014   PLT 195 04/25/2014   No results found for this basename: LABCA2   No components found with this basename: [id number]   No results found for this basename: INR,  in the last 168 hours  STUDIES: None  ASSESSMENT/PLAN: [name] [name] is a very pleasant 80 yo male with recently diagnosed stage IV pancreatic cancer with mets to the liver. He has some mild abdominal pain in his abdomen at times. His constipation has worsened with chemo.  We will give him Lactulose PRN for constipation.  We will also start him on lasix 20mg  daily for the pitting edema in his feet and ankles.  His CBC and CMP today look ok. We will wait and see what the rest of his labs show.  His port a cath site looks good.  He is being treated with Gemcitabine at a reduced dose, 4 weeks on  2 weeks off. He will proceed with treatment today as planned.  He will start wearing his compression stockings.  He has his treatment schedule.  He has a follow-up appointment with me next week.  All questions were answered. He knows to call the clinic with any problems, questions or concerns. We can certainly see him much sooner if necessary.  Eliezer Bottom, NP 04/25/2014 1:35 PM

## 2014-04-25 NOTE — Patient Instructions (Signed)
[place]  Discharge Instructions for Patients Receiving Chemotherapy  Today you received the following chemotherapy agents : Gemzar  To help prevent nausea and vomiting after your treatment, we encourage you to take your nausea medication   1) Zofran (Ondansetron) Take 1 tablet (8 mg total) by mouth 2 (two) times daily as needed (Nausea or vomiting)  2)  Compazine Take 1 tablet (10 mg total) by mouth every 6 (six) hours as needed (Nausea or vomiting).  3) Ativan  (Lorazepam) Take 1 tablet (0.5 mg total) by mouth every 6 (six) hours as needed (Nausea or vomiting).   If you develop nausea and vomiting that is not controlled by your nausea medication, call the clinic. If it is after clinic hours your family physician or the after hours number for the clinic or go to the Emergency Department.  Take 1 tablet (0.5 mg total) by mouth every 6 (six) hours as needed (Nausea or vomiting).   BELOW ARE SYMPTOMS THAT SHOULD BE REPORTED IMMEDIATELY:  *FEVER GREATER THAN 100.5 F  *CHILLS WITH OR WITHOUT FEVER  NAUSEA AND VOMITING THAT IS NOT CONTROLLED WITH YOUR NAUSEA MEDICATION  *UNUSUAL SHORTNESS OF BREATH  *UNUSUAL BRUISING OR BLEEDING  TENDERNESS IN MOUTH AND THROAT WITH OR WITHOUT PRESENCE OF ULCERS  *URINARY PROBLEMS  *BOWEL PROBLEMS  UNUSUAL RASH Items with * indicate a potential emergency and should be followed up as soon as possible.  One of the nurses will contact you 24 hours after your treatment. Please let the nurse know about any problems that you may have experienced. Feel free to call the clinic [phone]   I have been informed and understand all the instructions given to me. I know to contact the clinic, my physician, or go to the Emergency Department if any problems should occur. I do not have any questions at this time, but understand that I may call the clinic during office hours   should I have any questions or need assistance in obtaining follow up  care.    __________________________________________  _____________  __________ Signature of Patient or Authorized Representative            Date                   Time    __________________________________________ Nurse's Signature  Gemcitabine injection What is this medicine? GEMCITABINE (jem SIT a been) is a chemotherapy drug. This medicine is used to treat many types of cancer like breast cancer, lung cancer, pancreatic cancer, and ovarian cancer. This medicine may be used for other purposes; ask your health care provider or pharmacist if you have questions. COMMON BRAND NAME(S): Gemzar What should I tell my health care provider before I take this medicine? They need to know if you have any of these conditions: -blood disorders -infection -kidney disease -liver disease -recent or ongoing radiation therapy -an unusual or allergic reaction to gemcitabine, other chemotherapy, other medicines, foods, dyes, or preservatives -pregnant or trying to get pregnant -breast-feeding How should I use this medicine? This drug is given as an infusion into a vein. It is administered in a hospital or clinic by a specially trained health care professional. Talk to your pediatrician regarding the use of this medicine in children. Special care may be needed. Overdosage: If you think you have taken too much of this medicine contact a poison control center or emergency room at once. NOTE: This medicine is only for you. Do not share this medicine with others. What  if I miss a dose? It is important not to miss your dose. Call your doctor or health care professional if you are unable to keep an appointment. What may interact with this medicine? -medicines to increase blood counts like filgrastim, pegfilgrastim, sargramostim -some other chemotherapy drugs like cisplatin -vaccines Talk to your doctor or health care professional before taking any of these  medicines: -acetaminophen -aspirin -ibuprofen -ketoprofen -naproxen This list may not describe all possible interactions. Give your health care provider a list of all the medicines, herbs, non-prescription drugs, or dietary supplements you use. Also tell them if you smoke, drink alcohol, or use illegal drugs. Some items may interact with your medicine. What should I watch for while using this medicine? Visit your doctor for checks on your progress. This drug may make you feel generally unwell. This is not uncommon, as chemotherapy can affect healthy cells as well as cancer cells. Report any side effects. Continue your course of treatment even though you feel ill unless your doctor tells you to stop. In some cases, you may be given additional medicines to help with side effects. Follow all directions for their use. Call your doctor or health care professional for advice if you get a fever, chills or sore throat, or other symptoms of a cold or flu. Do not treat yourself. This drug decreases your body's ability to fight infections. Try to avoid being around people who are sick. This medicine may increase your risk to bruise or bleed. Call your doctor or health care professional if you notice any unusual bleeding. Be careful brushing and flossing your teeth or using a toothpick because you may get an infection or bleed more easily. If you have any dental work done, tell your dentist you are receiving this medicine. Avoid taking products that contain aspirin, acetaminophen, ibuprofen, naproxen, or ketoprofen unless instructed by your doctor. These medicines may hide a fever. Women should inform their doctor if they wish to become pregnant or think they might be pregnant. There is a potential for serious side effects to an unborn child. Talk to your health care professional or pharmacist for more information. Do not breast-feed an infant while taking this medicine. What side effects may I notice from  receiving this medicine? Side effects that you should report to your doctor or health care professional as soon as possible: -allergic reactions like skin rash, itching or hives, swelling of the face, lips, or tongue -low blood counts - this medicine may decrease the number of white blood cells, red blood cells and platelets. You may be at increased risk for infections and bleeding. -signs of infection - fever or chills, cough, sore throat, pain or difficulty passing urine -signs of decreased platelets or bleeding - bruising, pinpoint red spots on the skin, black, tarry stools, blood in the urine -signs of decreased red blood cells - unusually weak or tired, fainting spells, lightheadedness -breathing problems -chest pain -mouth sores -nausea and vomiting -pain, swelling, redness at site where injected -pain, tingling, numbness in the hands or feet -stomach pain -swelling of ankles, feet, hands -unusual bleeding Side effects that usually do not require medical attention (report to your doctor or health care professional if they continue or are bothersome): -constipation -diarrhea -hair loss -loss of appetite -stomach upset This list may not describe all possible side effects. Call your doctor for medical advice about side effects. You may report side effects to FDA at 1-800-FDA-1088. Where should I keep my medicine? This drug is given in  a hospital or clinic and will not be stored at home. NOTE: This sheet is a summary. It may not cover all possible information. If you have questions about this medicine, talk to your doctor, pharmacist, or health care provider.  2015, [employer]. (2007-11-15 18:45:54)

## 2014-05-01 ENCOUNTER — Other Ambulatory Visit: Payer: Self-pay | Admitting: Nurse Practitioner

## 2014-05-01 DIAGNOSIS — C679 Malignant neoplasm of bladder, unspecified: Secondary | ICD-10-CM

## 2014-05-02 ENCOUNTER — Ambulatory Visit (HOSPITAL_BASED_OUTPATIENT_CLINIC_OR_DEPARTMENT_OTHER): Payer: Medicare Other | Admitting: Family

## 2014-05-02 ENCOUNTER — Other Ambulatory Visit: Payer: Self-pay | Admitting: Nurse Practitioner

## 2014-05-02 ENCOUNTER — Ambulatory Visit (HOSPITAL_BASED_OUTPATIENT_CLINIC_OR_DEPARTMENT_OTHER): Payer: Medicare Other

## 2014-05-02 ENCOUNTER — Encounter: Payer: Self-pay | Admitting: Family

## 2014-05-02 ENCOUNTER — Ambulatory Visit (HOSPITAL_COMMUNITY)
Admission: RE | Admit: 2014-05-02 | Discharge: 2014-05-02 | Disposition: A | Discharge status: Home / Self Care | Payer: Medicare Other | Source: Ambulatory Visit | Attending: Hematology & Oncology | Admitting: Hematology & Oncology

## 2014-05-02 ENCOUNTER — Other Ambulatory Visit (HOSPITAL_BASED_OUTPATIENT_CLINIC_OR_DEPARTMENT_OTHER): Payer: Medicare Other | Admitting: Lab

## 2014-05-02 ENCOUNTER — Other Ambulatory Visit: Payer: Self-pay | Admitting: Family

## 2014-05-02 ENCOUNTER — Ambulatory Visit: Payer: Medicare Other | Admitting: Internal Medicine

## 2014-05-02 VITALS — BP 114/56 | HR 56 | Temp 98.4°F | Resp 14 | Ht 70.0 in | Wt 173.0 lb

## 2014-05-02 DIAGNOSIS — D649 Anemia, unspecified: Secondary | ICD-10-CM

## 2014-05-02 DIAGNOSIS — C787 Secondary malignant neoplasm of liver and intrahepatic bile duct: Secondary | ICD-10-CM

## 2014-05-02 DIAGNOSIS — M25473 Effusion, unspecified ankle: Secondary | ICD-10-CM | POA: Diagnosis not present

## 2014-05-02 DIAGNOSIS — C259 Malignant neoplasm of pancreas, unspecified: Secondary | ICD-10-CM

## 2014-05-02 DIAGNOSIS — D638 Anemia in other chronic diseases classified elsewhere: Secondary | ICD-10-CM

## 2014-05-02 DIAGNOSIS — C679 Malignant neoplasm of bladder, unspecified: Secondary | ICD-10-CM | POA: Diagnosis not present

## 2014-05-02 DIAGNOSIS — C799 Secondary malignant neoplasm of unspecified site: Secondary | ICD-10-CM

## 2014-05-02 DIAGNOSIS — Z8551 Personal history of malignant neoplasm of bladder: Secondary | ICD-10-CM

## 2014-05-02 DIAGNOSIS — C250 Malignant neoplasm of head of pancreas: Secondary | ICD-10-CM

## 2014-05-02 DIAGNOSIS — Z8546 Personal history of malignant neoplasm of prostate: Secondary | ICD-10-CM | POA: Diagnosis not present

## 2014-05-02 DIAGNOSIS — Z5111 Encounter for antineoplastic chemotherapy: Secondary | ICD-10-CM

## 2014-05-02 LAB — CMP (CANCER CENTER ONLY)
ALT(SGPT): 36 U/L (ref 10–47)
AST: 23 U/L (ref 11–38)
Albumin: 2.9 g/dL — ABNORMAL LOW (ref 3.3–5.5)
Alkaline Phosphatase: 80 U/L (ref 26–84)
BUN, Bld: 18 mg/dL (ref 7–22)
CALCIUM: 9.1 mg/dL (ref 8.0–10.3)
CO2: 28 mEq/L (ref 18–33)
CREATININE: 1 mg/dL (ref 0.6–1.2)
Chloride: 99 mEq/L (ref 98–108)
Glucose, Bld: 249 mg/dL — ABNORMAL HIGH (ref 73–118)
Potassium: 4.2 mEq/L (ref 3.3–4.7)
Sodium: 138 mEq/L (ref 128–145)
Total Bilirubin: 0.7 mg/dl (ref 0.20–1.60)
Total Protein: 6.8 g/dL (ref 6.4–8.1)

## 2014-05-02 LAB — HOLD TUBE, BLOOD BANK - CHCC SATELLITE

## 2014-05-02 LAB — CBC WITH DIFFERENTIAL (CANCER CENTER ONLY)
BASO#: 0 10*3/uL (ref 0.0–0.2)
BASO%: 0.2 % (ref 0.0–2.0)
EOS ABS: 0 10*3/uL (ref 0.0–0.5)
EOS%: 0.2 % (ref 0.0–7.0)
HCT: 27.2 % — ABNORMAL LOW (ref 38.7–49.9)
HEMOGLOBIN: 8.9 g/dL — AB (ref 13.0–17.1)
LYMPH#: 0.8 10*3/uL — AB (ref 0.9–3.3)
LYMPH%: 16.4 % (ref 14.0–48.0)
MCH: 31.2 pg (ref 28.0–33.4)
MCHC: 32.7 g/dL (ref 32.0–35.9)
MCV: 95 fL (ref 82–98)
MONO#: 0.6 10*3/uL (ref 0.1–0.9)
MONO%: 12.6 % (ref 0.0–13.0)
NEUT%: 70.6 % (ref 40.0–80.0)
NEUTROS ABS: 3.3 10*3/uL (ref 1.5–6.5)
Platelets: 181 10*3/uL (ref 145–400)
RBC: 2.85 10*6/uL — AB (ref 4.20–5.70)
RDW: 13.6 % (ref 11.1–15.7)
WBC: 4.7 10*3/uL (ref 4.0–10.0)

## 2014-05-02 LAB — PREALBUMIN: Prealbumin: 10.2 mg/dL — ABNORMAL LOW (ref 17.0–34.0)

## 2014-05-02 LAB — PREPARE RBC (CROSSMATCH)

## 2014-05-02 LAB — CANCER ANTIGEN 19-9: CA 19 9: 182.6 U/mL — AB (ref <35.0)

## 2014-05-02 MED ORDER — PROCHLORPERAZINE MALEATE 10 MG PO TABS
ORAL_TABLET | ORAL | Status: AC
  Filled 2014-05-02: qty 1

## 2014-05-02 MED ORDER — SODIUM CHLORIDE 0.9 % IJ SOLN
10.0000 mL | INTRAMUSCULAR | Status: DC | PRN
  Administered 2014-05-02: 10 mL
  Filled 2014-05-02: qty 10

## 2014-05-02 MED ORDER — PROCHLORPERAZINE MALEATE 10 MG PO TABS
10.0000 mg | ORAL_TABLET | Freq: Once | ORAL | Status: AC
  Administered 2014-05-02: 10 mg via ORAL

## 2014-05-02 MED ORDER — SODIUM CHLORIDE 0.9 % IV SOLN
Freq: Once | INTRAVENOUS | Status: AC
  Administered 2014-05-02: 11:00:00 via INTRAVENOUS

## 2014-05-02 MED ORDER — SODIUM CHLORIDE 0.9 % IV SOLN
1600.0000 mg | Freq: Once | INTRAVENOUS | Status: AC
  Administered 2014-05-02: 1600 mg via INTRAVENOUS
  Filled 2014-05-02: qty 36.82

## 2014-05-02 MED ORDER — HEPARIN SOD (PORK) LOCK FLUSH 100 UNIT/ML IV SOLN
500.0000 [IU] | Freq: Once | INTRAVENOUS | Status: AC | PRN
  Administered 2014-05-02: 500 [IU]
  Filled 2014-05-02: qty 5

## 2014-05-02 NOTE — Patient Instructions (Signed)

## 2014-05-02 NOTE — Progress Notes (Signed)
[facility]  [phone] [phone]  ID: [name] [name] OB: 01-Jan-1923 MR#: [id number] QIH#:[id number] Patient Care Team: Doe-Hyun Kyra Searles, DO as PCP - General (Internal Medicine)  DIAGNOSIS: Stage IV pancreatic cancer with mets to liver  INTERVAL HISTORY: [name] [name] is here today for follow-up and treatment. He has done well with treatment so far without experiencing too many symptoms. After his last treatment he had chills. He has had some constipation afterwards. He still has some swelling in his feet and ankles despite taking lasix. His albumin level is low. His Hgb is 8.9. He denies fever, n/v, cough, rash, headache, dizziness, SOB, chest pain, palpitations, diarrhea, blood in urine or stool. The lactulose has helped with his constipation. He's had no tenderness, numbness or tingling. His appetite is good and he is trying to drink plenty of fluids but says he could do better. 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 He has had no other issues with this. He also had superficial bladder cancer which was also treated and is no longer an issue. He wants to treated if possible. He has compression stockings but has not been wearing them. His CA 19-9 in October was 293.   CURRENT TREATMENT: Gemcitabine  REVIEW OF SYSTEMS: All other 10 point review of systems is negative.   PAST MEDICAL HISTORY: Past Medical History  Diagnosis Date  . Diverticulosis of colon (without mention of hemorrhage)   . Irritable bowel syndrome   . Personal history of malignant neoplasm of prostate     s/p  radioactive seed implants 2000  . Unspecified hereditary and idiopathic peripheral neuropathy   . Nonischemic cardiomyopathy cardiologist-  dr Aundra Dubin    EF 25%  per last echo 2012 and cardiologist note--  2007-  ef  40-45%;  2008- ef 35-40%;  2010- ef 30-35%;  2012- ef 25%  . Sinus bradycardia   . Hypertension   . Arthritis     hands  .  Hyperlipidemia   . History of DVT of lower extremity     2002  ,   left leg  . History of adenomatous polyp of colon     2001  . History of basal cell carcinoma excision   . Anticoagulated on Coumadin   . Type 2 diabetes, diet controlled   . GERD (gastroesophageal reflux disease)   . LBBB (left bundle branch block)   . Wears glasses   . Wears hearing aid     BILATERAL  . Unsteady gait   . DDD (degenerative disc disease)     cervical and lumbar  . DJD (degenerative joint disease)     hips  . Lower urinary tract symptoms (LUTS)   . 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 Coronary artery disease     per cath 2002  non-obstructive cad  . Chronic systolic heart failure   . H/O fracture of nose     12-30-2013-  pt fell at home open nose bone fx   PAST SURGICAL HISTORY: Past Surgical History  Procedure Laterality Date  . Knee arthroscopy Left 1991  . Ercp with sphincterotomy and stone removal  05/ 1993  &  05-11-2006  . Total knee arthroplasty  10/05/2011    Procedure: TOTAL KNEE ARTHROPLASTY;  Surgeon: Gearlean Alf, MD;  Location: [facility];  Service: Orthopedics;  Laterality:  Right;  . Transurethral resection of bladder tumor N/A 09/30/2012    Procedure: TRANSURETHRAL RESECTION OF BLADDER TUMOR (TURBT);  Surgeon: Franchot Gallo, MD;  Location: [facility];  Service: Urology;  Laterality: N/A;  WITH MITOMYCIN INSTILLATION   . Eus N/A 01/04/2014    Procedure: ESOPHAGEAL ENDOSCOPIC ULTRASOUND (EUS) RADIAL;  Surgeon: [name], MD;  Location: [facility];  Service: Endoscopy;  Laterality: N/A;  . Radioactive prostate seed implants  01/ 2000  . Mohs surgery  03/ 2009    LEFT EAR BASAL CELL CARCINOMA  . Tonsillectomy  AS CHILD  . Cholecystectomy open  1985  . Appendectomy  child  . Inguinal hernia repair Bilateral 1998    recurrent RIH  w/ repair 06-21-2006  . Total knee arthroplasty Left  02-21-2007    AND 04-21-2007  CLOSED MANIPULATION  . Cataract extraction w/ intraocular lens implant Right 2004  . Lumbar spine surgery  X2  . Cardiac catheterization  06-14-2001  dr Darnell Level brodie    non-obstructive CAD/  LM 30%,  pLAD 50%,  mLAD 30%,  RCA  &  CFX  irregularites ,  global hypokinesis,  ef 40%  . Cardiovascular stress test  02-07-2007   dr Darnell Level brodie    negative for ischemia,  low risk myoview perfusion study  . Transthoracic echocardiogram  01-28-2011    moderate LVH/  ef 25%/  diffuse hypokinesis/  mild LAE/  mild TR/  trivial AR & MR  . Cystoscopy w/ retrogrades Bilateral 03/09/2014    Procedure: CYSTOSCOPY WITH BILATERAL RETROGRADE PYELOGRAM BLADDER BX AND RENAL WASHINGS.;  Surgeon: Jorja Loa, MD;  Location[facility];  Service: Urology;  Laterality: Bilateral;  . Eus N/A 04/05/2014    Procedure: UPPER ENDOSCOPIC ULTRASOUND (EUS) LINEAR;  Surgeon: [name], MD;  Location: [facility];  Service: Endoscopy;  Laterality: N/A;  radial linear  . Endoscopic retrograde cholangiopancreatography (ercp) with propofol N/A 04/12/2014    Procedure: ENDOSCOPIC RETROGRADE CHOLANGIOPANCREATOGRAPHY (ERCP) WITH PROPOFOL;  Surgeon: [name], MD;  Location: [facility];  Service: Endoscopy;  Laterality: N/A;  metal stent  . Biliary stent placement N/A 04/12/2014    Procedure: BILIARY STENT PLACEMENT;  Surgeon: [name], MD;  Location: [facility];  Service: Endoscopy;  Laterality: N/A;   FAMILY HISTORY Family History  Problem Relation Age of Onset  . Throat cancer Brother   . Coronary artery disease Father     deceased   GYNECOLOGIC HISTORY:  No LMP for male patient.   SOCIAL HISTORY:  History   Social History  . Marital Status: Widowed    Spouse Name: N/A    Number of Children: 0  . Years of Education: N/A   Occupational History  .     Social History Main Topics  . Smoking status: Former Smoker -- 1.00 packs/day for 10 years     Types: Cigarettes    Start date: 04/10/1954    Quit date: 09/10/1982  . Smokeless tobacco: Never Used     Comment: quit 32 years ago  . Alcohol Use: No  . Drug Use: No  . Sexual Activity: Not on file   Other Topics Concern  . Not on file   Social History Narrative   Married, no children.    ADVANCED DIRECTIVES: <no information>  HEALTH MAINTENANCE: History  Substance Use Topics  . Smoking status: Former Smoker -- 1.00 packs/day for 10 years    Types: Cigarettes    Start date: 04/10/1954  Quit date: 09/10/1982  . Smokeless tobacco: Never Used     Comment: quit 32 years ago  . Alcohol Use: No   Colonoscopy: PAP: Bone density: Lipid panel:  No Known Allergies  Current Outpatient Prescriptions  Medication Sig Dispense Refill  . bisacodyl (DULCOLAX) 10 MG suppository Place 1 suppository (10 mg total) rectally daily as needed for moderate constipation.  12 suppository  0  . carvedilol (COREG) 12.5 MG tablet Take 12.5 mg by mouth 2 (two) times daily with a meal.      . docusate sodium 100 MG CAPS Take 100 mg by mouth 2 (two) times daily.  60 capsule  0  . furosemide (LASIX) 20 MG tablet Take 1 tablet (20 mg total) by mouth daily.  30 tablet  6  . HYDROcodone-acetaminophen (NORCO) 10-325 MG per tablet Take one tablet by mouth every 4 hours as needed for pain  180 tablet  0  . HYDROcodone-acetaminophen (NORCO/VICODIN) 5-325 MG per tablet Take 1 tablet by mouth every 4 (four) hours as needed for moderate pain.      Marland Kitchen insulin glargine (LANTUS) 100 UNIT/ML injection Inject 3 Units into the skin at bedtime.      Marland Kitchen lactulose (CHRONULAC) 10 GM/15ML solution Take 15 mLs (10 g total) by mouth 2 (two) times daily as needed for mild constipation or moderate constipation.  240 mL  1  . lidocaine-prilocaine (EMLA) cream Apply 1 application topically as needed.  30 g  0  . LORazepam (ATIVAN) 0.5 MG tablet Take 1 tablet (0.5 mg total) by mouth every 6 (six) hours as needed (Nausea or  vomiting).  30 tablet  0  . nitroGLYCERIN (NITROSTAT) 0.4 MG SL tablet Place 1 tablet (0.4 mg total) under the tongue every 5 (five) minutes as needed for chest pain.  90 tablet  3  . Nutritional Supplements (GLUCERNA ADVANCE SHAKE) LIQD Take by mouth every morning.      . Omega-3 Fatty Acids (FISH OIL) 1000 MG CAPS Take by mouth.      . ondansetron (ZOFRAN) 8 MG tablet Take 1 tablet (8 mg total) by mouth 2 (two) times daily as needed (Nausea or vomiting).  30 tablet  1  . polyethylene glycol (MIRALAX / GLYCOLAX) packet Take 17 g by mouth once as needed for mild constipation.       . pravastatin (PRAVACHOL) 40 MG tablet Take 40 mg by mouth every evening.       . pregabalin (LYRICA) 75 MG capsule Take 75 mg by mouth daily.      . prochlorperazine (COMPAZINE) 10 MG tablet Take 1 tablet (10 mg total) by mouth every 6 (six) hours as needed (Nausea or vomiting).  30 tablet  1  . ranitidine (ZANTAC) 150 MG tablet Take 150 mg by mouth 2 (two) times daily.      Marland Kitchen zolpidem (AMBIEN) 10 MG tablet Take 10 mg by mouth at bedtime as needed for sleep.      . [DISCONTINUED] Calcium Carbonate-Vitamin D (CALTRATE 600+D) 600-400 MG-UNIT per tablet Take 1 tablet by mouth daily.        No current facility-administered medications for this visit.   OBJECTIVE: Filed Vitals:   05/02/14 0932  BP: 114/56  Pulse: 56  Temp: 98.4 F (36.9 C)  Resp: 14   Body mass index is 24.82 kg/(m^2). ECOG FS:1 - Symptomatic but completely ambulatory Ocular: Sclerae unicteric, pupils equal, round and reactive to light Ear-nose-throat: Oropharynx clear, dentition fair Lymphatic: No cervical or supraclavicular adenopathy  Lungs no rales or rhonchi, good excursion bilaterally Heart regular rate and rhythm, no murmur appreciated Abd soft, nontender, positive bowel sounds MSK no focal spinal tenderness, no joint edema Neuro: non-focal, well-oriented, appropriate affect  LAB RESULTS: CMP     Component Value Date/Time   NA 138  05/02/2014 0910   NA 136 04/10/2014 0814   NA 137 04/10/2014   K 4.2 05/02/2014 0910   K 4.7 04/10/2014 0814   CL 99 05/02/2014 0910   CL 100 04/10/2014 0814   CO2 28 05/02/2014 0910   CO2 26 04/10/2014 0814   GLUCOSE 249* 05/02/2014 0910   GLUCOSE 196* 04/10/2014 0814   BUN 18 05/02/2014 0910   BUN 23 04/10/2014 0814   BUN 22* 04/10/2014   CREATININE 1.0 05/02/2014 0910   CREATININE 1.00 04/10/2014 0814   CREATININE 1.1 04/10/2014   CALCIUM 9.1 05/02/2014 0910   CALCIUM 9.6 04/10/2014 0814   PROT 6.8 05/02/2014 0910   PROT 7.3 04/10/2014 0814   ALBUMIN 4.0 04/10/2014 0814   AST 23 05/02/2014 0910   AST 17 04/10/2014 0814   ALT 36 05/02/2014 0910   ALT 20 04/10/2014 0814   ALKPHOS 80 05/02/2014 0910   ALKPHOS 146* 04/10/2014 0814   BILITOT 0.70 05/02/2014 0910   BILITOT 0.5 04/10/2014 0814   GFRNONAA 73* 03/31/2014 0421   GFRAA 85* 03/31/2014 0421   No results found for this basename: SPEP,  UPEP,   kappa and lambda light chains   Lab Results  Component Value Date   WBC 4.7 05/02/2014   NEUTROABS 3.3 05/02/2014   HGB 8.9* 05/02/2014   HCT 27.2* 05/02/2014   MCV 95 05/02/2014   PLT 181 05/02/2014   No results found for this basename: LABCA2   No components found with this basename: [id number]   No results found for this basename: INR,  in the last 168 hours  STUDIES: None  ASSESSMENT/PLAN: [name] [name] is a very pleasant 80 yo male with recently diagnosed stage IV pancreatic cancer with mets to the liver. He is tired today and still has swelling around his ankles.  We will go ahead with cycle 3 today. He is being treated with Gemcitabine at a reduced dose, 4 weeks on 2 weeks off.  We will transfuse 1 unit PRBCs tomorrow. Hopefully this will help him feel better and also help with the swelling in his ankles.  We will wait and see what the rest of his labs show.  His port a cath site looks good.  He has his treatment schedule.  He has a follow-up appointment next week.  All questions  were answered. He knows to call the clinic with any problems, questions or concerns. We can certainly see him much sooner if necessary.  Eliezer Bottom, NP 05/02/2014 10:15 AM

## 2014-05-03 ENCOUNTER — Encounter: Payer: Self-pay | Admitting: Hematology & Oncology

## 2014-05-03 ENCOUNTER — Ambulatory Visit (HOSPITAL_BASED_OUTPATIENT_CLINIC_OR_DEPARTMENT_OTHER): Payer: Medicare Other

## 2014-05-03 VITALS — BP 106/42 | HR 52 | Temp 98.7°F | Resp 16

## 2014-05-03 DIAGNOSIS — D649 Anemia, unspecified: Secondary | ICD-10-CM

## 2014-05-03 DIAGNOSIS — D638 Anemia in other chronic diseases classified elsewhere: Secondary | ICD-10-CM | POA: Diagnosis not present

## 2014-05-03 MED ORDER — SODIUM CHLORIDE 0.9 % IJ SOLN
10.0000 mL | INTRAMUSCULAR | Status: AC | PRN
  Administered 2014-05-03: 10 mL
  Filled 2014-05-03: qty 10

## 2014-05-03 MED ORDER — ACETAMINOPHEN 325 MG PO TABS
650.0000 mg | ORAL_TABLET | Freq: Once | ORAL | Status: AC
  Administered 2014-05-03: 650 mg via ORAL

## 2014-05-03 MED ORDER — DIPHENHYDRAMINE HCL 25 MG PO CAPS
ORAL_CAPSULE | ORAL | Status: AC
  Filled 2014-05-03: qty 2

## 2014-05-03 MED ORDER — FUROSEMIDE 10 MG/ML IJ SOLN
INTRAMUSCULAR | Status: AC
  Filled 2014-05-03: qty 4

## 2014-05-03 MED ORDER — ACETAMINOPHEN 325 MG PO TABS
ORAL_TABLET | ORAL | Status: AC
  Filled 2014-05-03: qty 2

## 2014-05-03 MED ORDER — FUROSEMIDE 10 MG/ML IJ SOLN
20.0000 mg | Freq: Once | INTRAMUSCULAR | Status: AC
  Administered 2014-05-03: 20 mg via INTRAVENOUS

## 2014-05-03 MED ORDER — DIPHENHYDRAMINE HCL 25 MG PO CAPS
25.0000 mg | ORAL_CAPSULE | Freq: Once | ORAL | Status: AC
  Administered 2014-05-03: 25 mg via ORAL

## 2014-05-03 MED ORDER — HEPARIN SOD (PORK) LOCK FLUSH 100 UNIT/ML IV SOLN
500.0000 [IU] | Freq: Every day | INTRAVENOUS | Status: AC | PRN
  Administered 2014-05-03: 500 [IU]
  Filled 2014-05-03: qty 5

## 2014-05-03 NOTE — Patient Instructions (Signed)

## 2014-05-04 ENCOUNTER — Encounter: Payer: Self-pay | Admitting: Nurse Practitioner

## 2014-05-04 LAB — TYPE AND SCREEN
ABO/RH(D): A POS
ANTIBODY SCREEN: NEGATIVE
UNIT DIVISION: 0

## 2014-05-04 NOTE — Progress Notes (Signed)
Received a call from [facility], [place] @ [address] [place] pt had some swelling in L leg. A doppler was completed and positive for DVT. Per NP at facility pt is being started on Eliquis 205mg  bid. He will continue to hold the lasix. Dr. Marin Olp is aware.

## 2014-05-07 ENCOUNTER — Non-Acute Institutional Stay (SKILLED_NURSING_FACILITY): Payer: Medicare Other | Admitting: Nurse Practitioner

## 2014-05-07 DIAGNOSIS — R7401 Elevation of levels of liver transaminase levels: Secondary | ICD-10-CM

## 2014-05-07 DIAGNOSIS — E119 Type 2 diabetes mellitus without complications: Secondary | ICD-10-CM

## 2014-05-07 DIAGNOSIS — E876 Hypokalemia: Secondary | ICD-10-CM

## 2014-05-07 DIAGNOSIS — G609 Hereditary and idiopathic neuropathy, unspecified: Secondary | ICD-10-CM

## 2014-05-07 DIAGNOSIS — R740 Nonspecific elevation of levels of transaminase and lactic acid dehydrogenase [LDH]: Secondary | ICD-10-CM

## 2014-05-07 DIAGNOSIS — K8689 Other specified diseases of pancreas: Secondary | ICD-10-CM

## 2014-05-07 DIAGNOSIS — I5022 Chronic systolic (congestive) heart failure: Secondary | ICD-10-CM

## 2014-05-07 DIAGNOSIS — K5900 Constipation, unspecified: Secondary | ICD-10-CM

## 2014-05-07 DIAGNOSIS — E440 Moderate protein-calorie malnutrition: Secondary | ICD-10-CM

## 2014-05-07 DIAGNOSIS — D649 Anemia, unspecified: Secondary | ICD-10-CM

## 2014-05-07 DIAGNOSIS — K869 Disease of pancreas, unspecified: Secondary | ICD-10-CM

## 2014-05-07 DIAGNOSIS — K219 Gastro-esophageal reflux disease without esophagitis: Secondary | ICD-10-CM

## 2014-05-07 DIAGNOSIS — C679 Malignant neoplasm of bladder, unspecified: Secondary | ICD-10-CM

## 2014-05-07 DIAGNOSIS — I10 Essential (primary) hypertension: Secondary | ICD-10-CM

## 2014-05-07 DIAGNOSIS — R300 Dysuria: Secondary | ICD-10-CM

## 2014-05-07 DIAGNOSIS — I82402 Acute embolism and thrombosis of unspecified deep veins of left lower extremity: Secondary | ICD-10-CM

## 2014-05-07 DIAGNOSIS — E871 Hypo-osmolality and hyponatremia: Secondary | ICD-10-CM

## 2014-05-07 DIAGNOSIS — G4700 Insomnia, unspecified: Secondary | ICD-10-CM

## 2014-05-07 NOTE — Assessment & Plan Note (Addendum)
05/04/14 Doppler LLE: DVT at left femoral and popliteal veins not completely occlusive. Will anticoagulate with Eliquis 2.5mg bid.   

## 2014-05-09 ENCOUNTER — Other Ambulatory Visit (HOSPITAL_BASED_OUTPATIENT_CLINIC_OR_DEPARTMENT_OTHER): Payer: Medicare Other | Admitting: Lab

## 2014-05-09 ENCOUNTER — Ambulatory Visit (HOSPITAL_BASED_OUTPATIENT_CLINIC_OR_DEPARTMENT_OTHER): Payer: Medicare Other

## 2014-05-09 ENCOUNTER — Encounter: Payer: Self-pay | Admitting: Nurse Practitioner

## 2014-05-09 ENCOUNTER — Encounter: Payer: Self-pay | Admitting: Family

## 2014-05-09 ENCOUNTER — Ambulatory Visit (HOSPITAL_BASED_OUTPATIENT_CLINIC_OR_DEPARTMENT_OTHER): Payer: Medicare Other | Admitting: Family

## 2014-05-09 VITALS — BP 113/56 | HR 53 | Temp 97.8°F | Resp 18 | Ht 69.0 in | Wt 174.0 lb

## 2014-05-09 DIAGNOSIS — C259 Malignant neoplasm of pancreas, unspecified: Secondary | ICD-10-CM

## 2014-05-09 DIAGNOSIS — D638 Anemia in other chronic diseases classified elsewhere: Secondary | ICD-10-CM

## 2014-05-09 DIAGNOSIS — C679 Malignant neoplasm of bladder, unspecified: Secondary | ICD-10-CM

## 2014-05-09 DIAGNOSIS — D649 Anemia, unspecified: Secondary | ICD-10-CM

## 2014-05-09 DIAGNOSIS — C799 Secondary malignant neoplasm of unspecified site: Secondary | ICD-10-CM

## 2014-05-09 DIAGNOSIS — Z5111 Encounter for antineoplastic chemotherapy: Secondary | ICD-10-CM | POA: Diagnosis not present

## 2014-05-09 DIAGNOSIS — C787 Secondary malignant neoplasm of liver and intrahepatic bile duct: Secondary | ICD-10-CM

## 2014-05-09 LAB — CMP (CANCER CENTER ONLY)
ALK PHOS: 193 U/L — AB (ref 26–84)
ALT(SGPT): 46 U/L (ref 10–47)
AST: 35 U/L (ref 11–38)
Albumin: 2.8 g/dL — ABNORMAL LOW (ref 3.3–5.5)
BILIRUBIN TOTAL: 0.6 mg/dL (ref 0.20–1.60)
BUN, Bld: 17 mg/dL (ref 7–22)
CO2: 28 mEq/L (ref 18–33)
Calcium: 9.1 mg/dL (ref 8.0–10.3)
Chloride: 99 mEq/L (ref 98–108)
Creat: 1.1 mg/dl (ref 0.6–1.2)
Glucose, Bld: 177 mg/dL — ABNORMAL HIGH (ref 73–118)
Potassium: 4.3 mEq/L (ref 3.3–4.7)
SODIUM: 139 meq/L (ref 128–145)
TOTAL PROTEIN: 6.6 g/dL (ref 6.4–8.1)

## 2014-05-09 LAB — CBC WITH DIFFERENTIAL (CANCER CENTER ONLY)
BASO#: 0 10*3/uL (ref 0.0–0.2)
BASO%: 0.2 % (ref 0.0–2.0)
EOS%: 0.4 % (ref 0.0–7.0)
Eosinophils Absolute: 0 10*3/uL (ref 0.0–0.5)
HCT: 29.5 % — ABNORMAL LOW (ref 38.7–49.9)
HGB: 9.5 g/dL — ABNORMAL LOW (ref 13.0–17.1)
LYMPH#: 0.7 10*3/uL — ABNORMAL LOW (ref 0.9–3.3)
LYMPH%: 14.7 % (ref 14.0–48.0)
MCH: 30.9 pg (ref 28.0–33.4)
MCHC: 32.2 g/dL (ref 32.0–35.9)
MCV: 96 fL (ref 82–98)
MONO#: 0.6 10*3/uL (ref 0.1–0.9)
MONO%: 12.7 % (ref 0.0–13.0)
NEUT%: 72 % (ref 40.0–80.0)
NEUTROS ABS: 3.3 10*3/uL (ref 1.5–6.5)
PLATELETS: 255 10*3/uL (ref 145–400)
RBC: 3.07 10*6/uL — ABNORMAL LOW (ref 4.20–5.70)
RDW: 15 % (ref 11.1–15.7)
WBC: 4.6 10*3/uL (ref 4.0–10.0)

## 2014-05-09 LAB — CANCER ANTIGEN 19-9: CA 19 9: 196.5 U/mL — AB (ref <35.0)

## 2014-05-09 MED ORDER — PROCHLORPERAZINE MALEATE 10 MG PO TABS
10.0000 mg | ORAL_TABLET | Freq: Once | ORAL | Status: AC
  Administered 2014-05-09: 10 mg via ORAL

## 2014-05-09 MED ORDER — PROCHLORPERAZINE MALEATE 10 MG PO TABS
ORAL_TABLET | ORAL | Status: AC
  Filled 2014-05-09: qty 1

## 2014-05-09 MED ORDER — GEMCITABINE HCL CHEMO INJECTION 1 GM/26.3ML
1600.0000 mg | Freq: Once | INTRAVENOUS | Status: AC
  Administered 2014-05-09: 1600 mg via INTRAVENOUS
  Filled 2014-05-09: qty 42.08

## 2014-05-09 MED ORDER — SODIUM CHLORIDE 0.9 % IJ SOLN
10.0000 mL | INTRAMUSCULAR | Status: DC | PRN
  Administered 2014-05-09: 10 mL
  Filled 2014-05-09: qty 10

## 2014-05-09 MED ORDER — HEPARIN SOD (PORK) LOCK FLUSH 100 UNIT/ML IV SOLN
500.0000 [IU] | Freq: Once | INTRAVENOUS | Status: AC | PRN
  Administered 2014-05-09: 500 [IU]
  Filled 2014-05-09: qty 5

## 2014-05-09 MED ORDER — SODIUM CHLORIDE 0.9 % IV SOLN
Freq: Once | INTRAVENOUS | Status: AC
  Administered 2014-05-09: 12:00:00 via INTRAVENOUS

## 2014-05-09 NOTE — Assessment & Plan Note (Signed)
Likely multifactorial with hematuria in the setting of recent treatment with Coumadin, anemia of chronic disease secondary to advanced malignancy contributory.  No current indication for transfusion. Hgb 9s.

## 2014-05-09 NOTE — Progress Notes (Signed)
Patient ID: [name] [name], male   DOB: 1923-01-10, 80 y.o.   MRN: [id number]   Code Status: Full code  No Known Allergies  Chief Complaint  Patient presents with  . Medical Management of Chronic Issues  . Acute Visit    epigastric area pain, on and off fever, new DVT LLE    HPI: Patient is a 80 y.o. male seen in the SNF at [facility] today for evaluation of fever, abd pain, spiking T, new DVT LLE, pancreatic  Mass, FTT, CHF,  and other chronic medical conditions.    Hospitalized 03/29/2014 -04/04/2014 for pancreatic mass, FTT, CHF. He was admitted 03/29/14 with chief complaint of malaise, decreased appetite, generalized weakness and hematuria. A CT scan done on admission showed a 4.9 cm enhancing lesion adjacent to the pancreatic head/uncinate process worrisome for a primary pancreatic adenocarcinoma and suspected multifocal hepatic metastasis. This mass was noted back in June when his urologist ordered a CT scan of his abdomen/pelvis, and he was referred to GI as an outpatient for further evaluation. An EUS with biopsy was planned however the patient did not hold his Coumadin as instructed and the biopsy was canceled. The mass was not detectable on the ultrasound. Further evaluation was subsequently halted given the patient's overall functional status and comorbidities. His wife died 3 weeks ago.    Problem List Items Addressed This Visit   Type 2 diabetes mellitus     Appears to be diet controlled. Not on oral hypoglycemics or insulin therapy at home.  Monitor CBG ac meals. Had SSI coverage when he was hospitalized.  04/09/14 CBG am 150-180, pm 186/235-adding Lantus 3 units qd qhs.     Transaminitis     04/10/14 Alkaline phosphatase 126(39-117) 05/01/14 AST 33, ALT 45, alk  Phos 75     SYSTOLIC HEART FAILURE, CHRONIC     Appears compensated. Continue Coreg. Lasix currently on hold secondary to dehydration. chronic systolic heart failure/cardiomyopathy, last echocardiogram done in  2012 with EF of 25%,     Relevant Medications      apixaban (ELIQUIS) 2.5 MG TABS tablet   Pancreatic mass     Epigastric pain is managed HC/APAP 10/325 q4prn    Normocytic anemia     Likely multifactorial with hematuria in the setting of recent treatment with Coumadin, anemia of chronic disease secondary to advanced malignancy contributory.  No current indication for transfusion. Hgb 9s.     Malnutrition of moderate degree     05/01/14 albumin 2.8. Malignancy is contributory.      Insomnia     Takes Zolpidem 25m qhs prn for sleep.      Hyponatremia     04/10/14 Na 137 05/01/14 Na 135     Hypokalemia     04/10/14 K 4.1 05/01/14 K 4.4     HYPERTENSION, BENIGN     Blood pressure controlled on Coreg.     Relevant Medications      apixaban (ELIQUIS) 2.5 MG TABS tablet   Hereditary and idiopathic peripheral neuropathy     Takes Pregabalin 710mdaily.     GERD     Takes ranitidine 15047mid.      Dysuria     Outpatient urine culture grew enterococcus, but followup culture done 03/30/14 negative.  Repeat urine culture. Spiked T 102.4 05/08/14-no fever 05/09/14. C/o dysuria on and off but denied suprapubic or CVA tenderness. Baseline frequency last night 2x.      Constipation     Adequately  managed with MiraLax bid prn and Colace 133m bid and Bisacodyl 173msuppository qd prn.      Bladder cancer     Originally diagnosed 09/2012: Pathology showing low-grade papillary urothelial cancer. Status post TURBT with instillation of mitomycin 09/30/12. He has had persistent positive cytology on subsequent cystoscopies.  Status post cystoscopy with bilateral retrograde pyelogram, bladder biopsy and renal washings 03/09/14.  Biopsies negative for malignancy.     Acute thromboembolism of deep veins of lower extremity - Primary     05/04/14 Doppler LLE: DVT at left femoral and popliteal veins not completely occlusive. Will anticoagulate with Eliquis 2.53m453mid.     Relevant  Medications      apixaban (ELIQUIS) 2.5 MG TABS tablet      Review of Systems:  Review of Systems  Constitutional: Positive for weight loss. Negative for fever, chills, malaise/fatigue and diaphoresis.       About #15 Ibs in the past year. Generalized weakness.   HENT: Negative for congestion, ear discharge, ear pain, hearing loss, nosebleeds, sore throat and tinnitus.   Eyes: Negative for blurred vision, double vision, photophobia, pain, discharge and redness.  Respiratory: Negative for cough, hemoptysis, sputum production, shortness of breath, wheezing and stridor.   Cardiovascular: Positive for leg swelling. Negative for chest pain, palpitations, orthopnea, claudication and PND.       New LLE  Gastrointestinal: Positive for abdominal pain. Negative for heartburn, nausea, vomiting, diarrhea, constipation, blood in stool and melena.       Epigastric area dull pain. He said it has been hurting for a long time  Genitourinary: Positive for dysuria and frequency. Negative for urgency, hematuria and flank pain.  Musculoskeletal: Negative for back pain, falls, joint pain, myalgias and neck pain.  Skin: Negative for itching and rash.  Neurological: Positive for weakness. Negative for dizziness, tingling, tremors, sensory change, speech change, focal weakness, seizures, loss of consciousness and headaches.  Endo/Heme/Allergies: Negative for environmental allergies and polydipsia. Does not bruise/bleed easily.  Psychiatric/Behavioral: Negative for depression, suicidal ideas, hallucinations, memory loss and substance abuse. The patient is not nervous/anxious and does not have insomnia.      Past Medical History  Diagnosis Date  . Diverticulosis of colon (without mention of hemorrhage)   . Irritable bowel syndrome   . Personal history of malignant neoplasm of prostate     s/p  radioactive seed implants 2000  . Unspecified hereditary and idiopathic peripheral neuropathy   . Nonischemic  cardiomyopathy cardiologist-  dr mclAundra Dubin EF 25%  per last echo 2012 and cardiologist note--  2007-  ef  40-45%;  2008- ef 35-40%;  2010- ef 30-35%;  2012- ef 25%  . Sinus bradycardia   . Hypertension   . Arthritis     hands  . Hyperlipidemia   . History of DVT of lower extremity     2002  ,   left leg  . History of adenomatous polyp of colon     2001  . History of basal cell carcinoma excision   . Anticoagulated on Coumadin   . Type 2 diabetes, diet controlled   . GERD (gastroesophageal reflux disease)   . LBBB (left bundle branch block)   . Wears glasses   . Wears hearing aid     BILATERAL  . Unsteady gait   . DDD (degenerative disc disease)     cervical and lumbar  . DJD (degenerative joint disease)     hips  . Lower urinary tract symptoms (  LUTS)   . 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 Coronary artery disease     per cath 2002  non-obstructive cad  . Chronic systolic heart failure   . H/O fracture of nose     12-30-2013-  pt fell at home open nose bone fx   Past Surgical History  Procedure Laterality Date  . Knee arthroscopy Left 1991  . Ercp with sphincterotomy and stone removal  05/ 1993  &  05-11-2006  . Total knee arthroplasty  10/05/2011    Procedure: TOTAL KNEE ARTHROPLASTY;  Surgeon: Gearlean Alf, MD;  Location: [facility];  Service: Orthopedics;  Laterality: Right;  . Transurethral resection of bladder tumor N/A 09/30/2012    Procedure: TRANSURETHRAL RESECTION OF BLADDER TUMOR (TURBT);  Surgeon: Franchot Gallo, MD;  Location: [facility];  Service: Urology;  Laterality: N/A;  WITH MITOMYCIN INSTILLATION   . Eus N/A 01/04/2014    Procedure: ESOPHAGEAL ENDOSCOPIC ULTRASOUND (EUS) RADIAL;  Surgeon: [name], MD;  Location: [facility];  Service: Endoscopy;  Laterality: N/A;  . Radioactive prostate seed implants  01/ 2000  . Mohs surgery  03/ 2009    LEFT EAR BASAL  CELL CARCINOMA  . Tonsillectomy  AS CHILD  . Cholecystectomy open  1985  . Appendectomy  child  . Inguinal hernia repair Bilateral 1998    recurrent RIH  w/ repair 06-21-2006  . Total knee arthroplasty Left 02-21-2007    AND 04-21-2007  CLOSED MANIPULATION  . Cataract extraction w/ intraocular lens implant Right 2004  . Lumbar spine surgery  X2  . Cardiac catheterization  06-14-2001  dr Darnell Level brodie    non-obstructive CAD/  LM 30%,  pLAD 50%,  mLAD 30%,  RCA  &  CFX  irregularites ,  global hypokinesis,  ef 40%  . Cardiovascular stress test  02-07-2007   dr Darnell Level brodie    negative for ischemia,  low risk myoview perfusion study  . Transthoracic echocardiogram  01-28-2011    moderate LVH/  ef 25%/  diffuse hypokinesis/  mild LAE/  mild TR/  trivial AR & MR  . Cystoscopy w/ retrogrades Bilateral 03/09/2014    Procedure: CYSTOSCOPY WITH BILATERAL RETROGRADE PYELOGRAM BLADDER BX AND RENAL WASHINGS.;  Surgeon: Jorja Loa, MD;  Location: [facility];  Service: Urology;  Laterality: Bilateral;  . Eus N/A 04/05/2014    Procedure: UPPER ENDOSCOPIC ULTRASOUND (EUS) LINEAR;  Surgeon: [name], MD;  Location: [facility];  Service: Endoscopy;  Laterality: N/A;  radial linear  . Endoscopic retrograde cholangiopancreatography (ercp) with propofol N/A 04/12/2014    Procedure: ENDOSCOPIC RETROGRADE CHOLANGIOPANCREATOGRAPHY (ERCP) WITH PROPOFOL;  Surgeon: [name], MD;  Location: [facility];  Service: Endoscopy;  Laterality: N/A;  metal stent  . Biliary stent placement N/A 04/12/2014    Procedure: BILIARY STENT PLACEMENT;  Surgeon: [name], MD;  Location: [facility];  Service: Endoscopy;  Laterality: N/A;   Social History:   reports that he quit smoking about 31 years ago. His smoking use included Cigarettes. He started smoking about 60 years ago. He has a 10 pack-year smoking history. He has never used smokeless tobacco. He reports that he does not drink  alcohol or use illicit drugs.  Family History  Problem Relation Age of Onset  . Throat cancer Brother   . Coronary artery disease Father     deceased  Medications: Patient's Medications  New Prescriptions   No medications on file  Previous Medications   APIXABAN (ELIQUIS) 2.5 MG TABS TABLET    Take 2.5 mg by mouth 2 (two) times daily.   BISACODYL (DULCOLAX) 10 MG SUPPOSITORY    Place 1 suppository (10 mg total) rectally daily as needed for moderate constipation.   CARVEDILOL (COREG) 12.5 MG TABLET    Take 12.5 mg by mouth 2 (two) times daily with a meal.   DOCUSATE SODIUM 100 MG CAPS    Take 100 mg by mouth 2 (two) times daily.   HYDROCODONE-ACETAMINOPHEN (NORCO) 10-325 MG PER TABLET    Take one tablet by mouth every 4 hours as needed for pain   HYDROCODONE-ACETAMINOPHEN (NORCO/VICODIN) 5-325 MG PER TABLET    Take 1 tablet by mouth every 4 (four) hours as needed for moderate pain.   INSULIN GLARGINE (LANTUS) 100 UNIT/ML INJECTION    Inject 3 Units into the skin at bedtime.   LACTULOSE (CHRONULAC) 10 GM/15ML SOLUTION    Take 15 mLs (10 g total) by mouth 2 (two) times daily as needed for mild constipation or moderate constipation.   LIDOCAINE-PRILOCAINE (EMLA) CREAM    Apply 1 application topically as needed.   LORAZEPAM (ATIVAN) 0.5 MG TABLET    Take 1 tablet (0.5 mg total) by mouth every 6 (six) hours as needed (Nausea or vomiting).   NITROGLYCERIN (NITROSTAT) 0.4 MG SL TABLET    Place 1 tablet (0.4 mg total) under the tongue every 5 (five) minutes as needed for chest pain.   NUTRITIONAL SUPPLEMENTS (GLUCERNA ADVANCE SHAKE) LIQD    Take by mouth every morning.   OMEGA-3 FATTY ACIDS (FISH OIL) 1000 MG CAPS    Take by mouth.   ONDANSETRON (ZOFRAN) 8 MG TABLET    Take 1 tablet (8 mg total) by mouth 2 (two) times daily as needed (Nausea or vomiting).   POLYETHYLENE GLYCOL (MIRALAX / GLYCOLAX) PACKET    Take 17 g by mouth once as needed for mild constipation.    PRAVASTATIN (PRAVACHOL) 40  MG TABLET    Take 40 mg by mouth every evening.    PREGABALIN (LYRICA) 75 MG CAPSULE    Take 75 mg by mouth daily.   PROCHLORPERAZINE (COMPAZINE) 10 MG TABLET    Take 1 tablet (10 mg total) by mouth every 6 (six) hours as needed (Nausea or vomiting).   RANITIDINE (ZANTAC) 150 MG TABLET    Take 150 mg by mouth 2 (two) times daily.   ZOLPIDEM (AMBIEN) 10 MG TABLET    Take 10 mg by mouth at bedtime as needed for sleep.  Modified Medications   No medications on file  Discontinued Medications   No medications on file     Physical Exam: Physical Exam  Constitutional: He is oriented to person, place, and time. He appears well-developed and well-nourished.  HENT:  Head: Normocephalic and atraumatic.  Mouth/Throat: Oropharynx is clear and moist.  Eyes: Conjunctivae and EOM are normal. Pupils are equal, round, and reactive to light. Right eye exhibits no discharge. Left eye exhibits no discharge. No scleral icterus.  Neck: Normal range of motion. Neck supple. No JVD present. No tracheal deviation present. No thyromegaly present.  Cardiovascular: Normal rate, regular rhythm and normal heart sounds.   Pulmonary/Chest: Effort normal and breath sounds normal. No stridor. He has no wheezes. He has no rales. He exhibits no tenderness.  Abdominal: Soft. Bowel sounds are normal. He exhibits no distension. There is no tenderness. There  is no rebound and no guarding.  Minimal epigastric tenderness.  No ascites  Musculoskeletal: Normal range of motion. He exhibits edema. He exhibits no tenderness.  LLE  Lymphadenopathy:    He has no cervical adenopathy.  Neurological: He is alert and oriented to person, place, and time. No cranial nerve deficit.  Skin: Skin is warm and dry. No rash noted. No erythema. No pallor.  Psychiatric: He has a normal mood and affect. His behavior is normal.    Filed Vitals:   05/07/14 1536  BP: 120/70  Pulse: 82  Temp: 97.7 F (36.5 C)  TempSrc: Tympanic  Resp: 20       Labs reviewed: Basic Metabolic Panel:  Recent Labs  04/18/14 1005 04/25/14 1043 05/02/14 0910  NA 139 138 138  K 3.9 4.6 4.2  CL 100 96* 99  CO2 24 27 28   GLUCOSE 187* 165* 249*  BUN 15 14 18   CREATININE 1.2 1.1 1.0  CALCIUM 8.7 9.0 9.1   Liver Function Tests:  Recent Labs  03/29/14 1610 03/30/14 0424  04/10/14 0814 04/18/14 1005 04/25/14 1043 05/02/14 0910  AST 130* 63*  < > 17 15 24 23   ALT 181* 118*  < > 20 11 24  36  ALKPHOS 480* 344*  < > 146* 82 81 80  BILITOT 1.0 0.7  --  0.5 0.60 0.70 0.70  PROT 7.1 5.7*  --  7.3 6.8 6.8 6.8  ALBUMIN 3.4* 2.8*  --  4.0  --   --   --   < > = values in this interval not displayed.  Recent Labs  12/19/13 1514 03/29/14 1610  LIPASE 8.0* 10*   No results found for this basename: AMMONIA,  in the last 8760 hours CBC:  Recent Labs  04/18/14 1005 04/25/14 1043 05/02/14 0910  WBC 6.0 6.1 4.7  NEUTROABS 4.6 4.8 3.3  HGB 9.5* 9.4* 8.9*  HCT 28.1* 28.5* 27.2*  MCV 95 96 95  PLT 205 195 181   Lipid Panel: No results found for this basename: CHOL, HDL, LDLCALC, TRIG, CHOLHDL, LDLDIRECT,  in the last 8760 hours  Past Procedures:  03/29/14 CT abd and pelvis w CM:  IMPRESSION:  4.9 cm enhancing lesion adjacent to the pancreatic head/uncinate  process, worrisome for primary pancreatic adenocarcinoma,  progressed.  Tumor likely involves the SMA/SMV and undersurface of the portal  vein. 9 mm short axis node in the porta hepatitis.  Suspected multifocal hepatic metastases, including a 3.2 cm lesion  in the right hepatic dome.   03/30/14 MR w wo CM abd:   IMPRESSION:  1. Subtle amorphous mass in the pancreatic head and proximal  pancreatic body currently measuring 4.4 x 3.2 cm, causing distal  common bile duct obstruction and demonstrating vascular involvement  (SMA, SMV, splenic vein, splenic portal confluence and proximal  portal vein), as above, highly concerning for primary pancreatic  neoplasm.  2.  Although there do appear to be multiple ill-defined hepatic  lesions, concerning for metastatic disease, these were poorly  evaluated on today's examination secondary to patient respiratory  motion. Continued attention on any future followup studies is  recommended, as metastatic disease to the liver is suspected.  3. Severe intra and extrahepatic biliary ductal dilatation.   05/04/14 Doppler LLE: DVT at left femoral and popliteal veins not completely occlusive. Will anticoagulate with Eliquis 2.63m bid.   Assessment/Plan Acute thromboembolism of deep veins of lower extremity 05/04/14 Doppler LLE: DVT at left femoral and popliteal veins not completely  occlusive. Will anticoagulate with Eliquis 2.27m bid.   Constipation Adequately managed with MiraLax bid prn and Colace 1067mbid and Bisacodyl 1044muppository qd prn.    Transaminitis 04/10/14 Alkaline phosphatase 126(39-117) 05/01/14 AST 33, ALT 45, alk  Phos 75   SYSTOLIC HEART FAILURE, CHRONIC Appears compensated. Continue Coreg. Lasix currently on hold secondary to dehydration. chronic systolic heart failure/cardiomyopathy, last echocardiogram done in 2012 with EF of 25%,   Hereditary and idiopathic peripheral neuropathy Takes Pregabalin 31m74mily.   GERD Takes ranitidine 150mg47m.    Dysuria Outpatient urine culture grew enterococcus, but followup culture done 03/30/14 negative.  Repeat urine culture. Spiked T 102.4 05/08/14-no fever 05/09/14. C/o dysuria on and off but denied suprapubic or CVA tenderness. Baseline frequency last night 2x.    Type 2 diabetes mellitus Appears to be diet controlled. Not on oral hypoglycemics or insulin therapy at home.  Monitor CBG ac meals. Had SSI coverage when he was hospitalized.  04/09/14 CBG am 150-180, pm 186/235-adding Lantus 3 units qd qhs.   Pancreatic mass Epigastric pain is managed HC/APAP 10/325 q4prn  Normocytic anemia Likely multifactorial with hematuria in the setting  of recent treatment with Coumadin, anemia of chronic disease secondary to advanced malignancy contributory.  No current indication for transfusion. Hgb 9s.   Malnutrition of moderate degree 05/01/14 albumin 2.8. Malignancy is contributory.    Insomnia Takes Zolpidem 10mg 28mprn for sleep.    Hyponatremia 04/10/14 Na 137 05/01/14 Na 135   Hypokalemia 04/10/14 K 4.1 05/01/14 K 4.4   HYPERTENSION, BENIGN Blood pressure controlled on Coreg.   Bladder cancer Originally diagnosed 09/2012: Pathology showing low-grade papillary urothelial cancer. Status post TURBT with instillation of mitomycin 09/30/12. He has had persistent positive cytology on subsequent cystoscopies.  Status post cystoscopy with bilateral retrograde pyelogram, bladder biopsy and renal washings 03/09/14.  Biopsies negative for malignancy.     Family/ Staff Communication: observe the patient  Goals of Care: SNF  Labs/tests ordered: UA C/S in am

## 2014-05-09 NOTE — Assessment & Plan Note (Signed)
Originally diagnosed 09/2012: Pathology showing low-grade papillary urothelial cancer. Status post TURBT with instillation of mitomycin 09/30/12. He has had persistent positive cytology on subsequent cystoscopies.  Status post cystoscopy with bilateral retrograde pyelogram, bladder biopsy and renal washings 03/09/14.  Biopsies negative for malignancy.

## 2014-05-09 NOTE — Progress Notes (Signed)
[place]  [phone]) [phone] [phone]) [phone]  ID: [name] [name] OB: 06-Aug-1922 MR#: [id number] JYN#:[id number] Patient Care Team: Doe-Hyun Kyra Searles, DO as PCP - General (Internal Medicine)  DIAGNOSIS: Stage IV pancreatic cancer with mets to liver  INTERVAL HISTORY: [name] [name] is here today for follow-up and treatment. He has done well with treatment so far without experiencing too many symptoms. He is feeling ok. He had a doppler study done of his left leg and he has a DVT to the popliteal and femoral veins. He is now on Eliquis. The swelling in his right leg is gone. He denies fever, n/v, cough, rash, headache, dizziness, SOB, chest pain, palpitations, diarrhea, blood in urine or stool. The lactulose has helped with his constipation. He's had no tenderness, numbness or tingling. His appetite is good and he is drinking plenty of fluids. 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 He has had no other issues with this. He also had superficial bladder cancer which was also treated and is no longer an issue. He wants to treated if possible. He has compression stockings but has not been wearing them. His CA 19-9 in October was 293.   CURRENT TREATMENT: Gemcitabine Eliquis 2.5 mg BID  REVIEW OF SYSTEMS: All other 10 point review of systems is negative.   PAST MEDICAL HISTORY: Past Medical History  Diagnosis Date  . Diverticulosis of colon (without mention of hemorrhage)   . Irritable bowel syndrome   . Personal history of malignant neoplasm of prostate     s/p  radioactive seed implants 2000  . Unspecified hereditary and idiopathic peripheral neuropathy   . Nonischemic cardiomyopathy cardiologist-  dr Aundra Dubin    EF 25%  per last echo 2012 and cardiologist note--  2007-  ef  40-45%;  2008- ef 35-40%;  2010- ef 30-35%;  2012- ef 25%  . Sinus bradycardia   . Hypertension   . Arthritis     hands  . Hyperlipidemia   . History of DVT of  lower extremity     2002  ,   left leg  . History of adenomatous polyp of colon     2001  . History of basal cell carcinoma excision   . Anticoagulated on Coumadin   . Type 2 diabetes, diet controlled   . GERD (gastroesophageal reflux disease)   . LBBB (left bundle branch block)   . Wears glasses   . Wears hearing aid     BILATERAL  . Unsteady gait   . DDD (degenerative disc disease)     cervical and lumbar  . DJD (degenerative joint disease)     hips  . Lower urinary tract symptoms (LUTS)   . 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 Coronary artery disease     per cath 2002  non-obstructive cad  . Chronic systolic heart failure   . H/O fracture of nose     12-30-2013-  pt fell at home open nose bone fx   PAST SURGICAL HISTORY: Past Surgical History  Procedure Laterality Date  . Knee arthroscopy Left 1991  . Ercp with sphincterotomy and stone removal  05/ 1993  &  05-11-2006  . Total knee arthroplasty  10/05/2011    Procedure: TOTAL KNEE ARTHROPLASTY;  Surgeon: Gearlean Alf, MD;  Location: [facility];  Service: Orthopedics;  Laterality: Right;  .  Transurethral resection of bladder tumor N/A 09/30/2012    Procedure: TRANSURETHRAL RESECTION OF BLADDER TUMOR (TURBT);  Surgeon: Franchot Gallo, MD;  Location: [facility];  Service: Urology;  Laterality: N/A;  WITH MITOMYCIN INSTILLATION   . Eus N/A 01/04/2014    Procedure: ESOPHAGEAL ENDOSCOPIC ULTRASOUND (EUS) RADIAL;  Surgeon: [name], MD;  Location: [facility];  Service: Endoscopy;  Laterality: N/A;  . Radioactive prostate seed implants  01/ 2000  . Mohs surgery  03/ 2009    LEFT EAR BASAL CELL CARCINOMA  . Tonsillectomy  AS CHILD  . Cholecystectomy open  1985  . Appendectomy  child  . Inguinal hernia repair Bilateral 1998    recurrent RIH  w/ repair 06-21-2006  . Total knee arthroplasty Left 02-21-2007    AND 04-21-2007  CLOSED  MANIPULATION  . Cataract extraction w/ intraocular lens implant Right 2004  . Lumbar spine surgery  X2  . Cardiac catheterization  06-14-2001  dr Darnell Level brodie    non-obstructive CAD/  LM 30%,  pLAD 50%,  mLAD 30%,  RCA  &  CFX  irregularites ,  global hypokinesis,  ef 40%  . Cardiovascular stress test  02-07-2007   dr Darnell Level brodie    negative for ischemia,  low risk myoview perfusion study  . Transthoracic echocardiogram  01-28-2011    moderate LVH/  ef 25%/  diffuse hypokinesis/  mild LAE/  mild TR/  trivial AR & MR  . Cystoscopy w/ retrogrades Bilateral 03/09/2014    Procedure: CYSTOSCOPY WITH BILATERAL RETROGRADE PYELOGRAM BLADDER BX AND RENAL WASHINGS.;  Surgeon: Jorja Loa, MD;  Location: [facility];  Service: Urology;  Laterality: Bilateral;  . Eus N/A 04/05/2014    Procedure: UPPER ENDOSCOPIC ULTRASOUND (EUS) LINEAR;  Surgeon: [name], MD;  Location: [facility];  Service: Endoscopy;  Laterality: N/A;  radial linear  . Endoscopic retrograde cholangiopancreatography (ercp) with propofol N/A 04/12/2014    Procedure: ENDOSCOPIC RETROGRADE CHOLANGIOPANCREATOGRAPHY (ERCP) WITH PROPOFOL;  Surgeon: [name], MD;  Location: [facility];  Service: Endoscopy;  Laterality: N/A;  metal stent  . Biliary stent placement N/A 04/12/2014    Procedure: BILIARY STENT PLACEMENT;  Surgeon: [name], MD;  Location: [facility];  Service: Endoscopy;  Laterality: N/A;   FAMILY HISTORY Family History  Problem Relation Age of Onset  . Throat cancer Brother   . Coronary artery disease Father     deceased   GYNECOLOGIC HISTORY:  No LMP for male patient.   SOCIAL HISTORY:  History   Social History  . Marital Status: Widowed    Spouse Name: N/A    Number of Children: 0  . Years of Education: N/A   Occupational History  .     Social History Main Topics  . Smoking status: Former Smoker -- 1.00 packs/day for 10 years    Types: Cigarettes    Start date:  04/10/1954    Quit date: 09/10/1982  . Smokeless tobacco: Never Used     Comment: quit 32 years ago  . Alcohol Use: No  . Drug Use: No  . Sexual Activity: Not on file   Other Topics Concern  . Not on file   Social History Narrative   Married, no children.    ADVANCED DIRECTIVES: <no information>  HEALTH MAINTENANCE: History  Substance Use Topics  . Smoking status: Former Smoker -- 1.00 packs/day for 10 years    Types: Cigarettes    Start date: 04/10/1954    Quit  date: 09/10/1982  . Smokeless tobacco: Never Used     Comment: quit 32 years ago  . Alcohol Use: No   Colonoscopy: PAP: Bone density: Lipid panel:  No Known Allergies  Current Outpatient Prescriptions  Medication Sig Dispense Refill  . apixaban (ELIQUIS) 2.5 MG TABS tablet Take 2.5 mg by mouth 2 (two) times daily.      . bisacodyl (DULCOLAX) 10 MG suppository Place 1 suppository (10 mg total) rectally daily as needed for moderate constipation.  12 suppository  0  . carvedilol (COREG) 12.5 MG tablet Take 12.5 mg by mouth 2 (two) times daily with a meal.      . docusate sodium 100 MG CAPS Take 100 mg by mouth 2 (two) times daily.  60 capsule  0  . HYDROcodone-acetaminophen (NORCO) 10-325 MG per tablet Take one tablet by mouth every 4 hours as needed for pain  180 tablet  0  . insulin glargine (LANTUS) 100 UNIT/ML injection Inject 3 Units into the skin at bedtime.      Marland Kitchen lactulose (CHRONULAC) 10 GM/15ML solution Take 15 mLs (10 g total) by mouth 2 (two) times daily as needed for mild constipation or moderate constipation.  240 mL  1  . lidocaine-prilocaine (EMLA) cream Apply 1 application topically as needed.  30 g  0  . LORazepam (ATIVAN) 0.5 MG tablet Take 1 tablet (0.5 mg total) by mouth every 6 (six) hours as needed (Nausea or vomiting).  30 tablet  0  . nitroGLYCERIN (NITROSTAT) 0.4 MG SL tablet Place 1 tablet (0.4 mg total) under the tongue every 5 (five) minutes as needed for chest pain.  90 tablet  3  .  Nutritional Supplements (GLUCERNA ADVANCE SHAKE) LIQD Take by mouth every morning.      . Omega-3 Fatty Acids (FISH OIL) 1000 MG CAPS Take by mouth.      . ondansetron (ZOFRAN) 8 MG tablet Take 1 tablet (8 mg total) by mouth 2 (two) times daily as needed (Nausea or vomiting).  30 tablet  1  . polyethylene glycol (MIRALAX / GLYCOLAX) packet Take 17 g by mouth once as needed for mild constipation.       . pravastatin (PRAVACHOL) 40 MG tablet Take 40 mg by mouth every evening.       . pregabalin (LYRICA) 75 MG capsule Take 75 mg by mouth daily.      . prochlorperazine (COMPAZINE) 10 MG tablet Take 1 tablet (10 mg total) by mouth every 6 (six) hours as needed (Nausea or vomiting).  30 tablet  1  . ranitidine (ZANTAC) 150 MG tablet Take 150 mg by mouth 2 (two) times daily.      Marland Kitchen zolpidem (AMBIEN) 10 MG tablet Take 10 mg by mouth at bedtime as needed for sleep.      . [DISCONTINUED] Calcium Carbonate-Vitamin D (CALTRATE 600+D) 600-400 MG-UNIT per tablet Take 1 tablet by mouth daily.        No current facility-administered medications for this visit.   Facility-Administered Medications Ordered in Other Visits  Medication Dose Route Frequency Provider Last Rate Last Dose  . sodium chloride 0.9 % injection 10 mL  10 mL Intracatheter PRN Volanda Napoleon, MD   10 mL at 05/09/14 1353   OBJECTIVE: Filed Vitals:   05/09/14 1123  BP: 113/56  Pulse: 53  Temp: 97.8 F (36.6 C)  Resp: 18   Body mass index is 25.68 kg/(m^2). ECOG FS:1 - Symptomatic but completely ambulatory Ocular: Sclerae unicteric, pupils equal,  round and reactive to light Ear-nose-throat: Oropharynx clear, dentition fair Lymphatic: No cervical or supraclavicular adenopathy Lungs no rales or rhonchi, good excursion bilaterally Heart regular rate and rhythm, no murmur appreciated Abd soft, nontender, positive bowel sounds MSK no focal spinal tenderness, no joint edema Neuro: non-focal, well-oriented, appropriate affect  LAB  RESULTS: CMP     Component Value Date/Time   NA 139 05/09/2014 1114   NA 136 04/10/2014 0814   NA 137 04/10/2014   K 4.3 05/09/2014 1114   K 4.7 04/10/2014 0814   CL 99 05/09/2014 1114   CL 100 04/10/2014 0814   CO2 28 05/09/2014 1114   CO2 26 04/10/2014 0814   GLUCOSE 177* 05/09/2014 1114   GLUCOSE 196* 04/10/2014 0814   BUN 17 05/09/2014 1114   BUN 23 04/10/2014 0814   BUN 22* 04/10/2014   CREATININE 1.1 05/09/2014 1114   CREATININE 1.00 04/10/2014 0814   CREATININE 1.1 04/10/2014   CALCIUM 9.1 05/09/2014 1114   CALCIUM 9.6 04/10/2014 0814   PROT 6.6 05/09/2014 1114   PROT 7.3 04/10/2014 0814   ALBUMIN 4.0 04/10/2014 0814   AST 35 05/09/2014 1114   AST 17 04/10/2014 0814   ALT 46 05/09/2014 1114   ALT 20 04/10/2014 0814   ALKPHOS 193* 05/09/2014 1114   ALKPHOS 146* 04/10/2014 0814   BILITOT 0.60 05/09/2014 1114   BILITOT 0.5 04/10/2014 0814   GFRNONAA 73* 03/31/2014 0421   GFRAA 85* 03/31/2014 0421   No results found for this basename: SPEP,  UPEP,   kappa and lambda light chains   Lab Results  Component Value Date   WBC 4.6 05/09/2014   NEUTROABS 3.3 05/09/2014   HGB 9.5* 05/09/2014   HCT 29.5* 05/09/2014   MCV 96 05/09/2014   PLT 255 05/09/2014   No results found for this basename: LABCA2   No components found with this basename: [id number]   No results found for this basename: INR,  in the last 168 hours  STUDIES: None  ASSESSMENT/PLAN: [name] [name] is a very pleasant 80 yo male with recently diagnosed stage IV pancreatic cancer with mets to the liver.  We will go ahead with treatment today. He is being treated with Gemcitabine at a reduced dose, 4 weeks on 2 weeks off.  We will continue to monitor his alk/phos level. His CBC was improved today after his receiving 1 unit of blood last week.  We will wait and see what the rest of his labs show.  His port a cath site looks good.  We will get him a new treatment and appointment schedule.  We may also get repeat scan next  week.   All questions were answered. He knows to call the clinic with any problems, questions or concerns. We can certainly see him much sooner if necessary.  Eliezer Bottom, NP 05/09/2014 2:19 PM

## 2014-05-09 NOTE — Assessment & Plan Note (Signed)
04/10/14 K 4.1 05/01/14 K 4.4

## 2014-05-09 NOTE — Assessment & Plan Note (Signed)
Blood pressure controlled on Coreg.

## 2014-05-09 NOTE — Assessment & Plan Note (Signed)
Takes Pregabalin 75mg  daily.

## 2014-05-09 NOTE — Patient Instructions (Signed)
[facility] Discharge Instructions for Patients Receiving Chemotherapy  Today you received the following chemotherapy agents Gemzar  To help prevent nausea and vomiting after your treatment, we encourage you to take your nausea medication.   If you develop nausea and vomiting that is not controlled by your nausea medication, call the clinic.   BELOW ARE SYMPTOMS THAT SHOULD BE REPORTED IMMEDIATELY:  *FEVER GREATER THAN 100.5 F  *CHILLS WITH OR WITHOUT FEVER  NAUSEA AND VOMITING THAT IS NOT CONTROLLED WITH YOUR NAUSEA MEDICATION  *UNUSUAL SHORTNESS OF BREATH  *UNUSUAL BRUISING OR BLEEDING  TENDERNESS IN MOUTH AND THROAT WITH OR WITHOUT PRESENCE OF ULCERS  *URINARY PROBLEMS  *BOWEL PROBLEMS  UNUSUAL RASH Items with * indicate a potential emergency and should be followed up as soon as possible.  Feel free to call the clinic you have any questions or concerns. The clinic phone number is [phone].

## 2014-05-09 NOTE — Assessment & Plan Note (Signed)
Takes Zolpidem 10mg  qhs prn for sleep.

## 2014-05-09 NOTE — Assessment & Plan Note (Signed)
Appears compensated. Continue Coreg. Lasix currently on hold secondary to dehydration. chronic systolic heart failure/cardiomyopathy, last echocardiogram done in 2012 with EF of 25%,

## 2014-05-09 NOTE — Assessment & Plan Note (Signed)
04/10/14 Alkaline phosphatase 505([phone]) 05/01/14 AST 33, ALT 45, alk  Phos 75

## 2014-05-09 NOTE — Assessment & Plan Note (Signed)
05/01/14 albumin 2.8. Malignancy is contributory.

## 2014-05-09 NOTE — Assessment & Plan Note (Signed)
04/10/14 Na 137 05/01/14 Na 135

## 2014-05-09 NOTE — Assessment & Plan Note (Signed)
Outpatient urine culture grew enterococcus, but followup culture done 03/30/14 negative.  Repeat urine culture. Spiked T 102.4 05/08/14-no fever 05/09/14. C/o dysuria on and off but denied suprapubic or CVA tenderness. Baseline frequency last night 2x.

## 2014-05-09 NOTE — Assessment & Plan Note (Signed)
Adequately managed with MiraLax bid prn and Colace 100mg  bid and Bisacodyl 10mg  suppository qd prn.

## 2014-05-09 NOTE — Assessment & Plan Note (Signed)
Epigastric pain is managed HC/APAP 10/325 q4prn

## 2014-05-09 NOTE — Assessment & Plan Note (Signed)
Takes ranitidine 150mg bid.    

## 2014-05-09 NOTE — Assessment & Plan Note (Signed)
Appears to be diet controlled. Not on oral hypoglycemics or insulin therapy at home.  Monitor CBG ac meals. Had SSI coverage when he was hospitalized.  04/09/14 CBG am 150-180, pm 186/235-adding Lantus 3 units qd qhs.

## 2014-05-14 ENCOUNTER — Encounter: Payer: Self-pay | Admitting: Nurse Practitioner

## 2014-05-28 ENCOUNTER — Non-Acute Institutional Stay (SKILLED_NURSING_FACILITY): Payer: Medicare Other | Admitting: Nurse Practitioner

## 2014-05-28 ENCOUNTER — Encounter: Payer: Self-pay | Admitting: Nurse Practitioner

## 2014-05-28 DIAGNOSIS — G609 Hereditary and idiopathic neuropathy, unspecified: Secondary | ICD-10-CM

## 2014-05-28 DIAGNOSIS — R609 Edema, unspecified: Secondary | ICD-10-CM

## 2014-05-28 DIAGNOSIS — I82402 Acute embolism and thrombosis of unspecified deep veins of left lower extremity: Secondary | ICD-10-CM

## 2014-05-28 DIAGNOSIS — R509 Fever, unspecified: Secondary | ICD-10-CM

## 2014-05-28 DIAGNOSIS — K5900 Constipation, unspecified: Secondary | ICD-10-CM

## 2014-05-28 DIAGNOSIS — I10 Essential (primary) hypertension: Secondary | ICD-10-CM

## 2014-05-28 DIAGNOSIS — G4700 Insomnia, unspecified: Secondary | ICD-10-CM

## 2014-05-28 DIAGNOSIS — K219 Gastro-esophageal reflux disease without esophagitis: Secondary | ICD-10-CM

## 2014-05-28 DIAGNOSIS — R5081 Fever presenting with conditions classified elsewhere: Secondary | ICD-10-CM

## 2014-05-28 DIAGNOSIS — E1121 Type 2 diabetes mellitus with diabetic nephropathy: Secondary | ICD-10-CM

## 2014-05-28 HISTORY — DX: Fever, unspecified: R50.9

## 2014-05-28 HISTORY — DX: Edema, unspecified: R60.9

## 2014-05-28 NOTE — Assessment & Plan Note (Addendum)
On and off. 05/28/14 T 102.2, 100.5 after Tylenol. No new symptoms. F/u  Oncology. Negative urine culture 05/11/14 for fever workup. Will obtain CBC, CMP, UA C/S, CXR

## 2014-05-28 NOTE — Assessment & Plan Note (Addendum)
12/20/13 Hgb A1c 7.3 04/09/14 CBG am 150-180, pm 186/235-adding Lantus 3 units qd qhs.  05/28/14 update Hgb A1c and obtain TSH

## 2014-05-28 NOTE — Assessment & Plan Note (Signed)
05/04/14 Doppler LLE: DVT at left femoral and popliteal veins not completely occlusive. continue Eliquis 2.5mg  bid. Left edema seen LLE

## 2014-05-28 NOTE — Progress Notes (Signed)
Patient ID: [name] [name], male   DOB: 1922/08/26, 80 y.o.   MRN: [id number]   Code Status: Full code  No Known Allergies  Chief Complaint  Patient presents with  . Medical Management of Chronic Issues  . Acute Visit    fever    HPI: Patient is a 80 y.o. male seen in the SNF at [facility] today for evaluation of fever, pancreatic  Mass, and other chronic medical conditions.    Hospitalized 03/29/2014 -04/04/2014 for pancreatic mass, FTT, CHF. He was admitted 03/29/14 with chief complaint of malaise, decreased appetite, generalized weakness and hematuria. A CT scan done on admission showed a 4.9 cm enhancing lesion adjacent to the pancreatic head/uncinate process worrisome for a primary pancreatic adenocarcinoma and suspected multifocal hepatic metastasis. This mass was noted back in June when his urologist ordered a CT scan of his abdomen/pelvis, and he was referred to GI as an outpatient for further evaluation. An EUS with biopsy was planned however the patient did not hold his Coumadin as instructed and the biopsy was canceled. The mass was not detectable on the ultrasound. Further evaluation was subsequently halted given the patient's overall functional status and comorbidities. His wife died 3 weeks ago.    Problem List Items Addressed This Visit    Type 2 diabetes mellitus    12/20/13 Hgb A1c 7.3 04/09/14 CBG am 150-180, pm 186/235-adding Lantus 3 units qd qhs.  05/28/14 update Hgb A1c and obtain TSH    Insomnia    Takes Zolpidem 10mg  qhs prn for sleep.      HYPERTENSION, BENIGN    Blood pressure controlled on Coreg 12.5mg  bid.       Hereditary and idiopathic peripheral neuropathy    Takes Pregabalin 75mg  daily.      GERD    Takes ranitidine 150mg  bid.     Fever presenting with conditions classified elsewhere    On and off. 05/28/14 T 102.2, 100.5 after Tylenol. No new symptoms. F/u  Oncology. Negative urine culture 05/11/14 for fever workup. Will obtain CBC, CMP, UA  C/S, CXR    Edema - Primary    Trace to 1+LLE: recent DVT, abd  malignancies, malnutrition, and hx of CHF are contributory factors. Continue compression stockings.     Constipation    Adequately managed with MiraLax bid prn and Colace 100mg  bid and Bisacodyl 10mg  suppository qd prn.       Acute thromboembolism of deep veins of lower extremity    05/04/14 Doppler LLE: DVT at left femoral and popliteal veins not completely occlusive. continue Eliquis 2.5mg  bid. Left edema seen LLE        Review of Systems:  Review of Systems  Constitutional: Positive for fever and weight loss. Negative for chills, malaise/fatigue and diaphoresis.       About #15 Ibs in the past year. Generalized weakness. C/0 being cold/hot feeling. T 100.5-102.2-responded to Tylenol partially.   HENT: Negative for congestion, ear discharge, ear pain, hearing loss, nosebleeds, sore throat and tinnitus.   Eyes: Negative for blurred vision, double vision, photophobia, pain, discharge and redness.  Respiratory: Negative for cough, hemoptysis, sputum production, shortness of breath, wheezing and stridor.   Cardiovascular: Positive for leg swelling. Negative for chest pain, palpitations, orthopnea, claudication and PND.       New LLE  Gastrointestinal: Positive for abdominal pain. Negative for heartburn, nausea, vomiting, diarrhea, constipation, blood in stool and melena.       Epigastric area dull pain. He said it  has been hurting for a long time  Genitourinary: Positive for frequency. Negative for dysuria, urgency, hematuria and flank pain.  Musculoskeletal: Negative for myalgias, back pain, joint pain, falls and neck pain.  Skin: Negative for itching and rash.       Port A Cath  Neurological: Positive for weakness. Negative for dizziness, tingling, tremors, sensory change, speech change, focal weakness, seizures, loss of consciousness and headaches.  Endo/Heme/Allergies: Negative for environmental allergies and  polydipsia. Does not bruise/bleed easily.  Psychiatric/Behavioral: Negative for depression, suicidal ideas, hallucinations, memory loss and substance abuse. The patient is not nervous/anxious and does not have insomnia.      Past Medical History  Diagnosis Date  . Diverticulosis of colon (without mention of hemorrhage)   . Irritable bowel syndrome   . Personal history of malignant neoplasm of prostate     s/p  radioactive seed implants 2000  . Unspecified hereditary and idiopathic peripheral neuropathy   . Nonischemic cardiomyopathy cardiologist-  dr Aundra Dubin    EF 25%  per last echo 2012 and cardiologist note--  2007-  ef  40-45%;  2008- ef 35-40%;  2010- ef 30-35%;  2012- ef 25%  . Sinus bradycardia   . Hypertension   . Arthritis     hands  . Hyperlipidemia   . History of DVT of lower extremity     2002  ,   left leg  . History of adenomatous polyp of colon     2001  . History of basal cell carcinoma excision   . Anticoagulated on Coumadin   . Type 2 diabetes, diet controlled   . GERD (gastroesophageal reflux disease)   . LBBB (left bundle branch block)   . Wears glasses   . Wears hearing aid     BILATERAL  . Unsteady gait   . DDD (degenerative disc disease)     cervical and lumbar  . DJD (degenerative joint disease)     hips  . Lower urinary tract symptoms (LUTS)   . 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 Coronary artery disease     per cath 2002  non-obstructive cad  . Chronic systolic heart failure   . H/O fracture of nose     12-30-2013-  pt fell at home open nose bone fx   Past Surgical History  Procedure Laterality Date  . Knee arthroscopy Left 1991  . Ercp with sphincterotomy and stone removal  05/ 1993  &  05-11-2006  . Total knee arthroplasty  10/05/2011    Procedure: TOTAL KNEE ARTHROPLASTY;  Surgeon: Gearlean Alf, MD;  Location: [facility];  Service: Orthopedics;   Laterality: Right;  . Transurethral resection of bladder tumor N/A 09/30/2012    Procedure: TRANSURETHRAL RESECTION OF BLADDER TUMOR (TURBT);  Surgeon: Franchot Gallo, MD;  Location: [facility];  Service: Urology;  Laterality: N/A;  WITH MITOMYCIN INSTILLATION   . Eus N/A 01/04/2014    Procedure: ESOPHAGEAL ENDOSCOPIC ULTRASOUND (EUS) RADIAL;  Surgeon: [name], MD;  Location: [facility];  Service: Endoscopy;  Laterality: N/A;  . Radioactive prostate seed implants  01/ 2000  . Mohs surgery  03/ 2009    LEFT EAR BASAL CELL CARCINOMA  . Tonsillectomy  AS CHILD  . Cholecystectomy open  1985  . Appendectomy  child  . Inguinal hernia repair Bilateral 1998    recurrent RIH  w/ repair 06-21-2006  . Total  knee arthroplasty Left 02-21-2007    AND 04-21-2007  CLOSED MANIPULATION  . Cataract extraction w/ intraocular lens implant Right 2004  . Lumbar spine surgery  X2  . Cardiac catheterization  06-14-2001  dr Darnell Level brodie    non-obstructive CAD/  LM 30%,  pLAD 50%,  mLAD 30%,  RCA  &  CFX  irregularites ,  global hypokinesis,  ef 40%  . Cardiovascular stress test  02-07-2007   dr Darnell Level brodie    negative for ischemia,  low risk myoview perfusion study  . Transthoracic echocardiogram  01-28-2011    moderate LVH/  ef 25%/  diffuse hypokinesis/  mild LAE/  mild TR/  trivial AR & MR  . Cystoscopy w/ retrogrades Bilateral 03/09/2014    Procedure: CYSTOSCOPY WITH BILATERAL RETROGRADE PYELOGRAM BLADDER BX AND RENAL WASHINGS.;  Surgeon: Jorja Loa, MD;  Location: [facility];  Service: Urology;  Laterality: Bilateral;  . Eus N/A 04/05/2014    Procedure: UPPER ENDOSCOPIC ULTRASOUND (EUS) LINEAR;  Surgeon: [name], MD;  Location: [facility];  Service: Endoscopy;  Laterality: N/A;  radial linear  . Endoscopic retrograde cholangiopancreatography (ercp) with propofol N/A 04/12/2014    Procedure: ENDOSCOPIC RETROGRADE CHOLANGIOPANCREATOGRAPHY (ERCP) WITH PROPOFOL;  Surgeon:  [name], MD;  Location: [facility];  Service: Endoscopy;  Laterality: N/A;  metal stent  . Biliary stent placement N/A 04/12/2014    Procedure: BILIARY STENT PLACEMENT;  Surgeon: [name], MD;  Location: [facility];  Service: Endoscopy;  Laterality: N/A;   Social History:   reports that he quit smoking about 31 years ago. His smoking use included Cigarettes. He started smoking about 60 years ago. He has a 10 pack-year smoking history. He has never used smokeless tobacco. He reports that he does not drink alcohol or use illicit drugs.  Family History  Problem Relation Age of Onset  . Throat cancer Brother   . Coronary artery disease Father     deceased    Medications: Patient's Medications  New Prescriptions   No medications on file  Previous Medications   APIXABAN (ELIQUIS) 2.5 MG TABS TABLET    Take 2.5 mg by mouth 2 (two) times daily.   BISACODYL (DULCOLAX) 10 MG SUPPOSITORY    Place 1 suppository (10 mg total) rectally daily as needed for moderate constipation.   CARVEDILOL (COREG) 12.5 MG TABLET    Take 12.5 mg by mouth 2 (two) times daily with a meal.   DOCUSATE SODIUM 100 MG CAPS    Take 100 mg by mouth 2 (two) times daily.   HYDROCODONE-ACETAMINOPHEN (NORCO) 10-325 MG PER TABLET    Take one tablet by mouth every 4 hours as needed for pain   INSULIN GLARGINE (LANTUS) 100 UNIT/ML INJECTION    Inject 3 Units into the skin at bedtime.   LACTULOSE (CHRONULAC) 10 GM/15ML SOLUTION    Take 15 mLs (10 g total) by mouth 2 (two) times daily as needed for mild constipation or moderate constipation.   LIDOCAINE-PRILOCAINE (EMLA) CREAM    Apply 1 application topically as needed.   LORAZEPAM (ATIVAN) 0.5 MG TABLET    Take 1 tablet (0.5 mg total) by mouth every 6 (six) hours as needed (Nausea or vomiting).   NITROGLYCERIN (NITROSTAT) 0.4 MG SL TABLET    Place 1 tablet (0.4 mg total) under the tongue every 5 (five) minutes as needed for chest pain.   NUTRITIONAL SUPPLEMENTS  (GLUCERNA ADVANCE SHAKE) LIQD    Take by mouth every  morning.   OMEGA-3 FATTY ACIDS (FISH OIL) 1000 MG CAPS    Take by mouth.   ONDANSETRON (ZOFRAN) 8 MG TABLET    Take 1 tablet (8 mg total) by mouth 2 (two) times daily as needed (Nausea or vomiting).   POLYETHYLENE GLYCOL (MIRALAX / GLYCOLAX) PACKET    Take 17 g by mouth once as needed for mild constipation.    PRAVASTATIN (PRAVACHOL) 40 MG TABLET    Take 40 mg by mouth every evening.    PREGABALIN (LYRICA) 75 MG CAPSULE    Take 75 mg by mouth daily.   PROCHLORPERAZINE (COMPAZINE) 10 MG TABLET    Take 1 tablet (10 mg total) by mouth every 6 (six) hours as needed (Nausea or vomiting).   RANITIDINE (ZANTAC) 150 MG TABLET    Take 150 mg by mouth 2 (two) times daily.   ZOLPIDEM (AMBIEN) 10 MG TABLET    Take 10 mg by mouth at bedtime as needed for sleep.  Modified Medications   No medications on file  Discontinued Medications   No medications on file     Physical Exam: Physical Exam  Constitutional: He is oriented to person, place, and time. He appears well-developed and well-nourished.  HENT:  Head: Normocephalic and atraumatic.  Mouth/Throat: Oropharynx is clear and moist.  Eyes: Conjunctivae and EOM are normal. Pupils are equal, round, and reactive to light. Right eye exhibits no discharge. Left eye exhibits no discharge. No scleral icterus.  Neck: Normal range of motion. Neck supple. No JVD present. No tracheal deviation present. No thyromegaly present.  Cardiovascular: Normal rate, regular rhythm and normal heart sounds.   Pulmonary/Chest: Effort normal and breath sounds normal. No stridor. He has no wheezes. He has no rales. He exhibits no tenderness.  Abdominal: Soft. Bowel sounds are normal. He exhibits no distension. There is no tenderness. There is no rebound and no guarding.  Minimal epigastric tenderness.  No ascites  Musculoskeletal: Normal range of motion. He exhibits edema. He exhibits no tenderness.  LLE  Lymphadenopathy:     He has no cervical adenopathy.  Neurological: He is alert and oriented to person, place, and time. No cranial nerve deficit.  Skin: Skin is warm and dry. No rash noted. No erythema. No pallor.  Psychiatric: He has a normal mood and affect. His behavior is normal.    Filed Vitals:   05/28/14 1222  BP: 130/70  Pulse: 78  Temp: 100.5 F (38.1 C)  TempSrc: Tympanic  Resp: 20      Labs reviewed: Basic Metabolic Panel:  Recent Labs  04/25/14 1043 05/02/14 0910 05/09/14 1114  NA 138 138 139  K 4.6 4.2 4.3  CL 96* 99 99  CO2 27 28 28   GLUCOSE 165* 249* 177*  BUN 14 18 17   CREATININE 1.1 1.0 1.1  CALCIUM 9.0 9.1 9.1   Liver Function Tests:  Recent Labs  03/29/14 1610 03/30/14 0424  04/10/14 0814  04/25/14 1043 05/02/14 0910 05/09/14 1114  AST 130* 63*  < > 17  < > 24 23 35  ALT 181* 118*  < > 20  < > 24 36 46  ALKPHOS 480* 344*  < > 146*  < > 81 80 193*  BILITOT 1.0 0.7  --  0.5  < > 0.70 0.70 0.60  PROT 7.1 5.7*  --  7.3  < > 6.8 6.8 6.6  ALBUMIN 3.4* 2.8*  --  4.0  --   --   --   --   < > =  values in this interval not displayed.  Recent Labs  12/19/13 1514 03/29/14 1610  LIPASE 8.0* 10*   No results for input(s): AMMONIA in the last 8760 hours. CBC:  Recent Labs  04/25/14 1043 05/02/14 0910 05/09/14 1114  WBC 6.1 4.7 4.6  NEUTROABS 4.8 3.3 3.3  HGB 9.4* 8.9* 9.5*  HCT 28.5* 27.2* 29.5*  MCV 96 95 96  PLT 195 181 255   Lipid Panel: No results for input(s): CHOL, HDL, LDLCALC, TRIG, CHOLHDL, LDLDIRECT in the last 8760 hours.  Past Procedures:  03/29/14 CT abd and pelvis w CM:  IMPRESSION:  4.9 cm enhancing lesion adjacent to the pancreatic head/uncinate  process, worrisome for primary pancreatic adenocarcinoma,  progressed.  Tumor likely involves the SMA/SMV and undersurface of the portal  vein. 9 mm short axis node in the porta hepatitis.  Suspected multifocal hepatic metastases, including a 3.2 cm lesion  in the right hepatic  dome.   03/30/14 MR w wo CM abd:   IMPRESSION:  1. Subtle amorphous mass in the pancreatic head and proximal  pancreatic body currently measuring 4.4 x 3.2 cm, causing distal  common bile duct obstruction and demonstrating vascular involvement  (SMA, SMV, splenic vein, splenic portal confluence and proximal  portal vein), as above, highly concerning for primary pancreatic  neoplasm.  2. Although there do appear to be multiple ill-defined hepatic  lesions, concerning for metastatic disease, these were poorly  evaluated on today's examination secondary to patient respiratory  motion. Continued attention on any future followup studies is  recommended, as metastatic disease to the liver is suspected.  3. Severe intra and extrahepatic biliary ductal dilatation.   05/04/14 Doppler LLE: DVT at left femoral and popliteal veins not completely occlusive. Will anticoagulate with Eliquis 2.5mg  bid.   Assessment/Plan Edema Trace to 1+LLE: recent DVT, abd  malignancies, malnutrition, and hx of CHF are contributory factors. Continue compression stockings.   Fever presenting with conditions classified elsewhere On and off. 05/28/14 T 102.2, 100.5 after Tylenol. No new symptoms. F/u  Oncology. Negative urine culture 05/11/14 for fever workup. Will obtain CBC, CMP, UA C/S, CXR  Type 2 diabetes mellitus 12/20/13 Hgb A1c 7.3 04/09/14 CBG am 150-180, pm 186/235-adding Lantus 3 units qd qhs.  05/28/14 update Hgb A1c and obtain TSH  Hereditary and idiopathic peripheral neuropathy Takes Pregabalin 75mg  daily.    GERD Takes ranitidine 150mg  bid.   HYPERTENSION, BENIGN Blood pressure controlled on Coreg 12.5mg  bid.     Insomnia Takes Zolpidem 10mg  qhs prn for sleep.    Constipation Adequately managed with MiraLax bid prn and Colace 100mg  bid and Bisacodyl 10mg  suppository qd prn.     Acute thromboembolism of deep veins of lower extremity 05/04/14 Doppler LLE: DVT at left femoral and popliteal  veins not completely occlusive. continue Eliquis 2.5mg  bid. Left edema seen LLE     Family/ Staff Communication: observe the patient.   Goals of Care: SNF  Labs/tests ordered: CBC, CMP, TSH, Hgb A1c, CXR

## 2014-05-28 NOTE — Assessment & Plan Note (Signed)
Trace to 1+LLE: recent DVT, abd  malignancies, malnutrition, and hx of CHF are contributory factors. Continue compression stockings.

## 2014-05-28 NOTE — Assessment & Plan Note (Signed)
Adequately managed with MiraLax bid prn and Colace 100mg  bid and Bisacodyl 10mg  suppository qd prn.

## 2014-05-28 NOTE — Assessment & Plan Note (Signed)
Takes Pregabalin 75mg  daily.

## 2014-05-28 NOTE — Assessment & Plan Note (Signed)
Takes ranitidine 150mg bid.    

## 2014-05-28 NOTE — Assessment & Plan Note (Signed)
Blood pressure controlled on Coreg 12.5mg bid 

## 2014-05-28 NOTE — Assessment & Plan Note (Signed)
Takes Zolpidem 10mg  qhs prn for sleep.

## 2014-05-29 LAB — CBC AND DIFFERENTIAL
HEMATOCRIT: 26 % — AB (ref 41–53)
Hemoglobin: 8.9 g/dL — AB (ref 13.5–17.5)
Platelets: 225 10*3/uL (ref 150–399)
WBC: 5.1 10*3/mL

## 2014-05-29 LAB — HEPATIC FUNCTION PANEL
ALT: 70 U/L — AB (ref 10–40)
AST: 64 U/L — AB (ref 14–40)
Alkaline Phosphatase: 209 U/L — AB (ref 25–125)
Bilirubin, Total: 0.6 mg/dL

## 2014-05-29 LAB — BASIC METABOLIC PANEL
BUN: 22 mg/dL — AB (ref 4–21)
Creatinine: 1 mg/dL (ref 0.6–1.3)
GLUCOSE: 108 mg/dL
Potassium: 4.3 mmol/L (ref 3.4–5.3)
Sodium: 136 mmol/L — AB (ref 137–147)

## 2014-05-29 LAB — TSH: TSH: 1.15 u[IU]/mL (ref 0.41–5.90)

## 2014-05-29 LAB — HEMOGLOBIN A1C: Hgb A1c MFr Bld: 7.1 % — AB (ref 4.0–6.0)

## 2014-05-30 ENCOUNTER — Other Ambulatory Visit (HOSPITAL_BASED_OUTPATIENT_CLINIC_OR_DEPARTMENT_OTHER): Payer: Medicare Other | Admitting: Lab

## 2014-05-30 ENCOUNTER — Encounter: Payer: Self-pay | Admitting: Family

## 2014-05-30 ENCOUNTER — Ambulatory Visit (HOSPITAL_BASED_OUTPATIENT_CLINIC_OR_DEPARTMENT_OTHER): Payer: Medicare Other | Admitting: Family

## 2014-05-30 ENCOUNTER — Other Ambulatory Visit: Payer: Self-pay | Admitting: Nurse Practitioner

## 2014-05-30 ENCOUNTER — Ambulatory Visit: Payer: Medicare Other

## 2014-05-30 ENCOUNTER — Encounter: Payer: Self-pay | Admitting: Nurse Practitioner

## 2014-05-30 DIAGNOSIS — C787 Secondary malignant neoplasm of liver and intrahepatic bile duct: Secondary | ICD-10-CM

## 2014-05-30 DIAGNOSIS — E876 Hypokalemia: Secondary | ICD-10-CM

## 2014-05-30 DIAGNOSIS — C250 Malignant neoplasm of head of pancreas: Secondary | ICD-10-CM | POA: Diagnosis not present

## 2014-05-30 DIAGNOSIS — J181 Lobar pneumonia, unspecified organism: Secondary | ICD-10-CM

## 2014-05-30 DIAGNOSIS — I10 Essential (primary) hypertension: Secondary | ICD-10-CM | POA: Diagnosis not present

## 2014-05-30 DIAGNOSIS — R7401 Elevation of levels of liver transaminase levels: Secondary | ICD-10-CM

## 2014-05-30 DIAGNOSIS — E871 Hypo-osmolality and hyponatremia: Secondary | ICD-10-CM

## 2014-05-30 DIAGNOSIS — E440 Moderate protein-calorie malnutrition: Secondary | ICD-10-CM

## 2014-05-30 DIAGNOSIS — I2510 Atherosclerotic heart disease of native coronary artery without angina pectoris: Secondary | ICD-10-CM

## 2014-05-30 DIAGNOSIS — Z86718 Personal history of other venous thrombosis and embolism: Secondary | ICD-10-CM

## 2014-05-30 DIAGNOSIS — C259 Malignant neoplasm of pancreas, unspecified: Secondary | ICD-10-CM

## 2014-05-30 DIAGNOSIS — C799 Secondary malignant neoplasm of unspecified site: Secondary | ICD-10-CM

## 2014-05-30 DIAGNOSIS — J189 Pneumonia, unspecified organism: Secondary | ICD-10-CM | POA: Insufficient documentation

## 2014-05-30 DIAGNOSIS — R740 Nonspecific elevation of levels of transaminase and lactic acid dehydrogenase [LDH]: Principal | ICD-10-CM

## 2014-05-30 LAB — CBC WITH DIFFERENTIAL (CANCER CENTER ONLY)
BASO#: 0 10*3/uL (ref 0.0–0.2)
BASO%: 0.6 % (ref 0.0–2.0)
EOS ABS: 0.1 10*3/uL (ref 0.0–0.5)
EOS%: 1.9 % (ref 0.0–7.0)
HEMATOCRIT: 32.9 % — AB (ref 38.7–49.9)
HGB: 10.7 g/dL — ABNORMAL LOW (ref 13.0–17.1)
LYMPH#: 1 10*3/uL (ref 0.9–3.3)
LYMPH%: 18.4 % (ref 14.0–48.0)
MCH: 31.9 pg (ref 28.0–33.4)
MCHC: 32.5 g/dL (ref 32.0–35.9)
MCV: 98 fL (ref 82–98)
MONO#: 0.6 10*3/uL (ref 0.1–0.9)
MONO%: 10.8 % (ref 0.0–13.0)
NEUT#: 3.6 10*3/uL (ref 1.5–6.5)
NEUT%: 68.3 % (ref 40.0–80.0)
PLATELETS: 256 10*3/uL (ref 145–400)
RBC: 3.35 10*6/uL — AB (ref 4.20–5.70)
RDW: 17.7 % — ABNORMAL HIGH (ref 11.1–15.7)
WBC: 5.3 10*3/uL (ref 4.0–10.0)

## 2014-05-30 LAB — CMP (CANCER CENTER ONLY)
ALT(SGPT): 62 U/L — ABNORMAL HIGH (ref 10–47)
AST: 41 U/L — ABNORMAL HIGH (ref 11–38)
Albumin: 3 g/dL — ABNORMAL LOW (ref 3.3–5.5)
Alkaline Phosphatase: 184 U/L — ABNORMAL HIGH (ref 26–84)
BILIRUBIN TOTAL: 0.6 mg/dL (ref 0.20–1.60)
BUN: 20 mg/dL (ref 7–22)
CO2: 27 meq/L (ref 18–33)
CREATININE: 1 mg/dL (ref 0.6–1.2)
Calcium: 9.2 mg/dL (ref 8.0–10.3)
Chloride: 102 mEq/L (ref 98–108)
GLUCOSE: 172 mg/dL — AB (ref 73–118)
Potassium: 4.3 mEq/L (ref 3.3–4.7)
Sodium: 141 mEq/L (ref 128–145)
Total Protein: 6.9 g/dL (ref 6.4–8.1)

## 2014-05-30 LAB — CANCER ANTIGEN 19-9: CA 19 9: 193.5 U/mL — AB (ref <35.0)

## 2014-05-30 NOTE — Progress Notes (Signed)
[place]  [phone] [phone]) [phone]  ID: [name] [name] OB: 03/25/1923 MR#: [id number]#:[id number] Patient Care Team: Doe-Hyun Kyra Searles, DO as PCP - General (Internal Medicine)  DIAGNOSIS: Stage IV pancreatic cancer with mets to liver  INTERVAL HISTORY: [name] [name] is here today for follow-up and treatment. He has done well with treatment so far without experiencing too many symptoms. He is feeling ok. The swelling in his left leg is gone and he is wearing a compression stocking. He is still taking his Eliquis. He denies fever, n/v, cough, rash, headache, dizziness, SOB, chest pain, palpitations, diarrhea, blood in urine or stool. The lactulose has helped with his constipation. He's had no tenderness, numbness or tingling. His appetite is not as good. His taste is changing probably from the chemo. He is down 8 lbs since his last visit. He is trying to drink plenty of fluids. His CA 19-9 in late October was 196.   CURRENT TREATMENT: Gemcitabine on 4 weeks off 2 Eliquis 2.5 mg BID  REVIEW OF SYSTEMS: All other 10 point review of systems is negative.   PAST MEDICAL HISTORY: Past Medical History  Diagnosis Date  . Diverticulosis of colon (without mention of hemorrhage)   . Irritable bowel syndrome   . Personal history of malignant neoplasm of prostate     s/p  radioactive seed implants 2000  . Unspecified hereditary and idiopathic peripheral neuropathy   . Nonischemic cardiomyopathy cardiologist-  dr Aundra Dubin    EF 25%  per last echo 2012 and cardiologist note--  2007-  ef  40-45%;  2008- ef 35-40%;  2010- ef 30-35%;  2012- ef 25%  . Sinus bradycardia   . Hypertension   . Arthritis     hands  . Hyperlipidemia   . History of DVT of lower extremity     2002  ,   left leg  . History of adenomatous polyp of colon     2001  . History of basal cell carcinoma excision   . Anticoagulated on Coumadin   . Type 2 diabetes, diet controlled   . GERD  (gastroesophageal reflux disease)   . LBBB (left bundle branch block)   . Wears glasses   . Wears hearing aid     BILATERAL  . Unsteady gait   . DDD (degenerative disc disease)     cervical and lumbar  . DJD (degenerative joint disease)     hips  . Lower urinary tract symptoms (LUTS)   . 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 Coronary artery disease     per cath 2002  non-obstructive cad  . Chronic systolic heart failure   . H/O fracture of nose     12-30-2013-  pt fell at home open nose bone fx   PAST SURGICAL HISTORY: Past Surgical History  Procedure Laterality Date  . Knee arthroscopy Left 1991  . Ercp with sphincterotomy and stone removal  05/ 1993  &  05-11-2006  . Total knee arthroplasty  10/05/2011    Procedure: TOTAL KNEE ARTHROPLASTY;  Surgeon: Gearlean Alf, MD;  Location: [facility];  Service: Orthopedics;  Laterality: Right;  . Transurethral resection of bladder tumor N/A 09/30/2012    Procedure: TRANSURETHRAL RESECTION OF BLADDER TUMOR (TURBT);  Surgeon: Franchot Gallo, MD;  Location: [facility];  Service: Urology;  Laterality: N/A;  WITH MITOMYCIN INSTILLATION   . Eus N/A  01/04/2014    Procedure: ESOPHAGEAL ENDOSCOPIC ULTRASOUND (EUS) RADIAL;  Surgeon: [name], MD;  Location: [facility];  Service: Endoscopy;  Laterality: N/A;  . Radioactive prostate seed implants  01/ 2000  . Mohs surgery  03/ 2009    LEFT EAR BASAL CELL CARCINOMA  . Tonsillectomy  AS CHILD  . Cholecystectomy open  1985  . Appendectomy  child  . Inguinal hernia repair Bilateral 1998    recurrent RIH  w/ repair 06-21-2006  . Total knee arthroplasty Left 02-21-2007    AND 04-21-2007  CLOSED MANIPULATION  . Cataract extraction w/ intraocular lens implant Right 2004  . Lumbar spine surgery  X2  . Cardiac catheterization  06-14-2001  dr Darnell Level brodie    non-obstructive CAD/  LM 30%,  pLAD 50%,  mLAD 30%,   RCA  &  CFX  irregularites ,  global hypokinesis,  ef 40%  . Cardiovascular stress test  02-07-2007   dr Darnell Level brodie    negative for ischemia,  low risk myoview perfusion study  . Transthoracic echocardiogram  01-28-2011    moderate LVH/  ef 25%/  diffuse hypokinesis/  mild LAE/  mild TR/  trivial AR & MR  . Cystoscopy w/ retrogrades Bilateral 03/09/2014    Procedure: CYSTOSCOPY WITH BILATERAL RETROGRADE PYELOGRAM BLADDER BX AND RENAL WASHINGS.;  Surgeon: Jorja Loa, MD;  Location: [facility];  Service: Urology;  Laterality: Bilateral;  . Eus N/A 04/05/2014    Procedure: UPPER ENDOSCOPIC ULTRASOUND (EUS) LINEAR;  Surgeon: [name], MD;  Location: [facility];  Service: Endoscopy;  Laterality: N/A;  radial linear  . Endoscopic retrograde cholangiopancreatography (ercp) with propofol N/A 04/12/2014    Procedure: ENDOSCOPIC RETROGRADE CHOLANGIOPANCREATOGRAPHY (ERCP) WITH PROPOFOL;  Surgeon: [name], MD;  Location: [facility];  Service: Endoscopy;  Laterality: N/A;  metal stent  . Biliary stent placement N/A 04/12/2014    Procedure: BILIARY STENT PLACEMENT;  Surgeon: [name], MD;  Location: [facility];  Service: Endoscopy;  Laterality: N/A;   FAMILY HISTORY Family History  Problem Relation Age of Onset  . Throat cancer Brother   . Coronary artery disease Father     deceased   GYNECOLOGIC HISTORY:  No LMP for male patient.   SOCIAL HISTORY:  History   Social History  . Marital Status: Widowed    Spouse Name: N/A    Number of Children: 0  . Years of Education: N/A   Occupational History  .     Social History Main Topics  . Smoking status: Former Smoker -- 1.00 packs/day for 10 years    Types: Cigarettes    Start date: 04/10/1954    Quit date: 09/10/1982  . Smokeless tobacco: Never Used     Comment: quit 32 years ago  . Alcohol Use: No  . Drug Use: No  . Sexual Activity: Not on file   Other Topics Concern  . Not on file    Social History Narrative   Married, no children.    ADVANCED DIRECTIVES: <no information>  HEALTH MAINTENANCE: History  Substance Use Topics  . Smoking status: Former Smoker -- 1.00 packs/day for 10 years    Types: Cigarettes    Start date: 04/10/1954    Quit date: 09/10/1982  . Smokeless tobacco: Never Used     Comment: quit 32 years ago  . Alcohol Use: No   Colonoscopy: PAP: Bone density: Lipid panel:  No Known Allergies  Current Outpatient Prescriptions  Medication Sig  Dispense Refill  . apixaban (ELIQUIS) 2.5 MG TABS tablet Take 2.5 mg by mouth 2 (two) times daily.    . bisacodyl (DULCOLAX) 10 MG suppository Place 1 suppository (10 mg total) rectally daily as needed for moderate constipation. 12 suppository 0  . carvedilol (COREG) 12.5 MG tablet Take 12.5 mg by mouth 2 (two) times daily with a meal.    . docusate sodium 100 MG CAPS Take 100 mg by mouth 2 (two) times daily. 60 capsule 0  . HYDROcodone-acetaminophen (NORCO) 10-325 MG per tablet Take one tablet by mouth every 4 hours as needed for pain 180 tablet 0  . insulin glargine (LANTUS) 100 UNIT/ML injection Inject 3 Units into the skin at bedtime.    Marland Kitchen lactulose (CHRONULAC) 10 GM/15ML solution Take 15 mLs (10 g total) by mouth 2 (two) times daily as needed for mild constipation or moderate constipation. 240 mL 1  . lidocaine-prilocaine (EMLA) cream Apply 1 application topically as needed. 30 g 0  . LORazepam (ATIVAN) 0.5 MG tablet Take 1 tablet (0.5 mg total) by mouth every 6 (six) hours as needed (Nausea or vomiting). 30 tablet 0  . moxifloxacin (AVELOX) 400 MG tablet Take 400 mg by mouth daily at 8 pm. D/C 06-07-14    . nitroGLYCERIN (NITROSTAT) 0.4 MG SL tablet Place 1 tablet (0.4 mg total) under the tongue every 5 (five) minutes as needed for chest pain. 90 tablet 3  . Nutritional Supplements (GLUCERNA ADVANCE SHAKE) LIQD Take by mouth every morning.    . Omega-3 Fatty Acids (FISH OIL) 1000 MG CAPS Take by mouth.     . ondansetron (ZOFRAN) 8 MG tablet Take 1 tablet (8 mg total) by mouth 2 (two) times daily as needed (Nausea or vomiting). 30 tablet 1  . pravastatin (PRAVACHOL) 40 MG tablet Take 40 mg by mouth every evening.     . pregabalin (LYRICA) 75 MG capsule Take 75 mg by mouth daily.    . ranitidine (ZANTAC) 150 MG tablet Take 150 mg by mouth 2 (two) times daily.    Marland Kitchen saccharomyces boulardii (FLORASTOR) 250 MG capsule Take 250 mg by mouth 2 (two) times daily. D/C 06-07-14    . temazepam (RESTORIL) 15 MG capsule Take 15 mg by mouth at bedtime as needed for sleep.    . VOLTAREN 1 % GEL   1  . prochlorperazine (COMPAZINE) 10 MG tablet Take 1 tablet (10 mg total) by mouth every 6 (six) hours as needed (Nausea or vomiting). 30 tablet 1  . [DISCONTINUED] Calcium Carbonate-Vitamin D (CALTRATE 600+D) 600-400 MG-UNIT per tablet Take 1 tablet by mouth daily.      No current facility-administered medications for this visit.   OBJECTIVE: Filed Vitals:   05/30/14 1024  BP: 115/64  Pulse: 53  Temp: 97.8 F (36.6 C)  Resp: 18   Body mass index is 23.82 kg/(m^2). ECOG FS:1 - Symptomatic but completely ambulatory Ocular: Sclerae unicteric, pupils equal, round and reactive to light Ear-nose-throat: Oropharynx clear, dentition fair Lymphatic: No cervical or supraclavicular adenopathy Lungs no rales or rhonchi, good excursion bilaterally Heart regular rate and rhythm, no murmur appreciated Abd soft, nontender, positive bowel sounds MSK no focal spinal tenderness, no joint edema Neuro: non-focal, well-oriented, appropriate affect  LAB RESULTS: CMP     Component Value Date/Time   NA 141 05/30/2014 0957   NA 136* 05/29/2014   NA 136 04/10/2014 0814   K 4.3 05/30/2014 0957   K 4.3 05/29/2014   CL 102 05/30/2014 0957  CL 100 04/10/2014 0814   CO2 27 05/30/2014 0957   CO2 26 04/10/2014 0814   GLUCOSE 172* 05/30/2014 0957   GLUCOSE 196* 04/10/2014 0814   BUN 20 05/30/2014 0957   BUN 22* 05/29/2014    BUN 23 04/10/2014 0814   CREATININE 1.0 05/30/2014 0957   CREATININE 1.0 05/29/2014   CREATININE 1.00 04/10/2014 0814   CALCIUM 9.2 05/30/2014 0957   CALCIUM 9.6 04/10/2014 0814   PROT 6.9 05/30/2014 0957   PROT 7.3 04/10/2014 0814   ALBUMIN 4.0 04/10/2014 0814   AST 41* 05/30/2014 0957   AST 64* 05/29/2014   ALT 62* 05/30/2014 0957   ALT 70* 05/29/2014   ALKPHOS 184* 05/30/2014 0957   ALKPHOS 209* 05/29/2014   BILITOT 0.60 05/30/2014 0957   BILITOT 0.5 04/10/2014 0814   GFRNONAA 73* 03/31/2014 0421   GFRAA 85* 03/31/2014 0421   No results found for: SPEP Lab Results  Component Value Date   WBC 5.3 05/30/2014   NEUTROABS 3.6 05/30/2014   HGB 10.7* 05/30/2014   HCT 32.9* 05/30/2014   MCV 98 05/30/2014   PLT 256 05/30/2014   No results found for: LABCA2 No components found for: PRXYV859 No results for input(s): INR in the last 168 hours.  STUDIES: None  ASSESSMENT/PLAN: Mr. Stage is a very pleasant 80 yo male with recently diagnosed stage IV pancreatic cancer with mets to the liver.  We will hold off on treatment this week because of his not eating and weight loss. I instruted him to stay hydrated and to increase his calorie count. We will resume with cycle 5 next week. We will get a CT of his chest, abdomen and pelvis next week.   His CBC today was ok. We will see what the rest of his labs show.  All questions were answered. He knows to call the clinic with any problems, questions or concerns. We can certainly see him much sooner if necessary.  Eliezer Bottom, NP 05/30/2014 2:28 PM

## 2014-05-30 NOTE — Progress Notes (Signed)
Per Dr Marin Olp, pt is not to receive chemotherapy today.

## 2014-06-04 ENCOUNTER — Ambulatory Visit (HOSPITAL_BASED_OUTPATIENT_CLINIC_OR_DEPARTMENT_OTHER)
Admission: RE | Admit: 2014-06-04 | Discharge: 2014-06-04 | Disposition: A | Discharge status: Home / Self Care | Payer: Medicare Other | Source: Ambulatory Visit | Attending: Family | Admitting: Family

## 2014-06-04 ENCOUNTER — Encounter (HOSPITAL_BASED_OUTPATIENT_CLINIC_OR_DEPARTMENT_OTHER): Payer: Self-pay

## 2014-06-04 DIAGNOSIS — C259 Malignant neoplasm of pancreas, unspecified: Secondary | ICD-10-CM | POA: Diagnosis present

## 2014-06-04 DIAGNOSIS — Z09 Encounter for follow-up examination after completed treatment for conditions other than malignant neoplasm: Secondary | ICD-10-CM | POA: Diagnosis not present

## 2014-06-04 DIAGNOSIS — Z9221 Personal history of antineoplastic chemotherapy: Secondary | ICD-10-CM | POA: Insufficient documentation

## 2014-06-04 MED ORDER — IOHEXOL 300 MG/ML  SOLN
100.0000 mL | Freq: Once | INTRAMUSCULAR | Status: AC | PRN
  Administered 2014-06-04: 100 mL via INTRAVENOUS

## 2014-06-06 ENCOUNTER — Ambulatory Visit: Payer: Medicare Other

## 2014-06-06 ENCOUNTER — Encounter: Payer: Self-pay | Admitting: Hematology & Oncology

## 2014-06-06 ENCOUNTER — Ambulatory Visit (HOSPITAL_BASED_OUTPATIENT_CLINIC_OR_DEPARTMENT_OTHER): Payer: Medicare Other | Admitting: Hematology & Oncology

## 2014-06-06 ENCOUNTER — Other Ambulatory Visit: Payer: Medicare Other | Admitting: Lab

## 2014-06-06 ENCOUNTER — Other Ambulatory Visit (HOSPITAL_BASED_OUTPATIENT_CLINIC_OR_DEPARTMENT_OTHER): Payer: Medicare Other | Admitting: Lab

## 2014-06-06 VITALS — BP 130/58 | HR 50 | Temp 98.4°F | Resp 18 | Ht 69.0 in | Wt 169.0 lb

## 2014-06-06 DIAGNOSIS — C787 Secondary malignant neoplasm of liver and intrahepatic bile duct: Secondary | ICD-10-CM

## 2014-06-06 DIAGNOSIS — C250 Malignant neoplasm of head of pancreas: Secondary | ICD-10-CM

## 2014-06-06 DIAGNOSIS — C259 Malignant neoplasm of pancreas, unspecified: Secondary | ICD-10-CM

## 2014-06-06 DIAGNOSIS — R7989 Other specified abnormal findings of blood chemistry: Secondary | ICD-10-CM

## 2014-06-06 LAB — CBC WITH DIFFERENTIAL (CANCER CENTER ONLY)
BASO#: 0 10*3/uL (ref 0.0–0.2)
BASO%: 0.6 % (ref 0.0–2.0)
EOS%: 1.3 % (ref 0.0–7.0)
Eosinophils Absolute: 0.1 10*3/uL (ref 0.0–0.5)
HCT: 33.8 % — ABNORMAL LOW (ref 38.7–49.9)
HGB: 10.9 g/dL — ABNORMAL LOW (ref 13.0–17.1)
LYMPH#: 1.1 10*3/uL (ref 0.9–3.3)
LYMPH%: 21.4 % (ref 14.0–48.0)
MCH: 31.8 pg (ref 28.0–33.4)
MCHC: 32.2 g/dL (ref 32.0–35.9)
MCV: 99 fL — ABNORMAL HIGH (ref 82–98)
MONO#: 0.6 10*3/uL (ref 0.1–0.9)
MONO%: 10.7 % (ref 0.0–13.0)
NEUT%: 66 % (ref 40.0–80.0)
NEUTROS ABS: 3.5 10*3/uL (ref 1.5–6.5)
Platelets: 205 10*3/uL (ref 145–400)
RBC: 3.43 10*6/uL — ABNORMAL LOW (ref 4.20–5.70)
RDW: 17.5 % — AB (ref 11.1–15.7)
WBC: 5.2 10*3/uL (ref 4.0–10.0)

## 2014-06-06 LAB — CMP (CANCER CENTER ONLY)
ALBUMIN: 3.1 g/dL — AB (ref 3.3–5.5)
ALK PHOS: 300 U/L — AB (ref 26–84)
ALT(SGPT): 179 U/L — ABNORMAL HIGH (ref 10–47)
AST: 187 U/L — AB (ref 11–38)
BILIRUBIN TOTAL: 0.7 mg/dL (ref 0.20–1.60)
BUN, Bld: 19 mg/dL (ref 7–22)
CO2: 26 mEq/L (ref 18–33)
Calcium: 9 mg/dL (ref 8.0–10.3)
Chloride: 101 mEq/L (ref 98–108)
Creat: 1.1 mg/dl (ref 0.6–1.2)
GLUCOSE: 184 mg/dL — AB (ref 73–118)
POTASSIUM: 4.7 meq/L (ref 3.3–4.7)
Sodium: 144 mEq/L (ref 128–145)
Total Protein: 7.2 g/dL (ref 6.4–8.1)

## 2014-06-06 MED ORDER — CAPECITABINE 500 MG PO TABS
1500.0000 mg | ORAL_TABLET | Freq: Two times a day (BID) | ORAL | Status: DC
Start: 2014-06-06 — End: 2014-06-20

## 2014-06-06 NOTE — Progress Notes (Signed)
Hematology and Oncology Follow Up Visit  [name] [name] [id number] [birth year] 80 y.o. 06/06/2014   Principle Diagnosis:  Stage IV pancreatic cancer with mets to liver  Current Therapy:    Status post 6 weeks of gemcitabine     Interim History:  [name] is back for follow-up. He actually did quite well with his chemotherapy. I think we had to give him a transfusion but outside of that, he really had very little in the way of side effects.  His CA 19-9 came down. It has stabilized. His last level was 194. We first saw him, the CA 19-9 was over 400.  We did go ahead and do a CT scan on him. I foresee, he has more liver metastases. He now has to liver metastases. His pancreatic lesion is actually a little bit smaller.  His weights been holding pretty steady. He's had a decent appetite. He did have pneumonia last week. He is finishing up antibiotics today.  He's had no diarrhea. He's had no nausea or vomiting.  His leg swelling has been improved. He has compression stockings on.  Overall, his performance status is ECOG 2  Medications: Current outpatient prescriptions: apixaban (ELIQUIS) 2.5 MG TABS tablet, Take 2.5 mg by mouth 2 (two) times daily., Disp: , Rfl: ;  bisacodyl (DULCOLAX) 10 MG suppository, Place 1 suppository (10 mg total) rectally daily as needed for moderate constipation., Disp: 12 suppository, Rfl: 0;  carvedilol (COREG) 12.5 MG tablet, Take 12.5 mg by mouth 2 (two) times daily with a meal., Disp: , Rfl:  docusate sodium 100 MG CAPS, Take 100 mg by mouth 2 (two) times daily., Disp: 60 capsule, Rfl: 0;  HYDROcodone-acetaminophen (NORCO) 10-325 MG per tablet, Take one tablet by mouth every 4 hours as needed for pain, Disp: 180 tablet, Rfl: 0;  insulin glargine (LANTUS) 100 UNIT/ML injection, Inject 3 Units into the skin at bedtime., Disp: , Rfl:  lactulose (CHRONULAC) 10 GM/15ML solution, Take 15 mLs (10 g total) by mouth 2 (two) times daily as needed for mild constipation  or moderate constipation., Disp: 240 mL, Rfl: 1;  lidocaine-prilocaine (EMLA) cream, Apply 1 application topically as needed., Disp: 30 g, Rfl: 0;  moxifloxacin (AVELOX) 400 MG tablet, Take 400 mg by mouth daily at 8 pm. D/C 06-07-14, Disp: , Rfl:  nitroGLYCERIN (NITROSTAT) 0.4 MG SL tablet, Place 1 tablet (0.4 mg total) under the tongue every 5 (five) minutes as needed for chest pain., Disp: 90 tablet, Rfl: 3;  Nutritional Supplements (GLUCERNA ADVANCE SHAKE) LIQD, Take by mouth every morning., Disp: , Rfl: ;  Omega-3 Fatty Acids (FISH OIL) 1000 MG CAPS, Take by mouth., Disp: , Rfl: ;  pravastatin (PRAVACHOL) 40 MG tablet, Take 40 mg by mouth every evening. , Disp: , Rfl:  pregabalin (LYRICA) 75 MG capsule, Take 75 mg by mouth daily., Disp: , Rfl: ;  ranitidine (ZANTAC) 150 MG tablet, Take 150 mg by mouth 2 (two) times daily., Disp: , Rfl: ;  saccharomyces boulardii (FLORASTOR) 250 MG capsule, Take 250 mg by mouth 2 (two) times daily. D/C 06-07-14, Disp: , Rfl: ;  temazepam (RESTORIL) 15 MG capsule, Take 15 mg by mouth at bedtime as needed for sleep., Disp: , Rfl: ;  VOLTAREN 1 % GEL, , Disp: , Rfl: 1 capecitabine (XELODA) 500 MG tablet, Take 3 tablets (1,500 mg total) by mouth 2 (two) times daily after a meal., Disp: 84 tablet, Rfl: 4;  [DISCONTINUED] Calcium Carbonate-Vitamin D (CALTRATE 600+D) 600-400 MG-UNIT per tablet,  Take 1 tablet by mouth daily. , Disp: , Rfl:   Allergies: No Known Allergies  Past Medical History, Surgical history, Social history, and Family History were reviewed and updated.  Review of Systems: As above  Physical Exam:  height is 5\' 9"  (1.753 m) and weight is 169 lb (76.658 kg). His oral temperature is 98.4 F (36.9 C). His blood pressure is 130/58 and his pulse is 50. His respiration is 18.   Elderly white gentleman in no obvious distress. Head and neck exam shows no ocular or oral lesions. He has no palpable cervical or supraclavicular lymph nodes. There might be some  slight temporal muscle wasting. Lungs are clear. Cardiac exam regular rate and rhythm with no murmurs, rubs or bruits. Abdomen is soft. There is no fluid wave. There is no palpable liver or spleen tip. He has no palpable abdominal mass. Back exam shows no tenderness over the spine, ribs or hips. Extremities shows some trace edema in his lower legs. He has some osteoarthritic changes in his joints. Skin exam shows no rashes, ecchymoses or petechia.  Lab Results  Component Value Date   WBC 5.2 06/06/2014   HGB 10.9* 06/06/2014   HCT 33.8* 06/06/2014   MCV 99* 06/06/2014   PLT 205 06/06/2014     Chemistry      Component Value Date/Time   NA 144 06/06/2014 1159   NA 136* 05/29/2014   NA 136 04/10/2014 0814   K 4.7 06/06/2014 1159   K 4.3 05/29/2014   CL 101 06/06/2014 1159   CL 100 04/10/2014 0814   CO2 26 06/06/2014 1159   CO2 26 04/10/2014 0814   BUN 19 06/06/2014 1159   BUN 22* 05/29/2014   BUN 23 04/10/2014 0814   CREATININE 1.1 06/06/2014 1159   CREATININE 1.0 05/29/2014   CREATININE 1.00 04/10/2014 0814   GLU 108 05/29/2014      Component Value Date/Time   CALCIUM 9.0 06/06/2014 1159   CALCIUM 9.6 04/10/2014 0814   ALKPHOS 300* 06/06/2014 1159   ALKPHOS 209* 05/29/2014   AST 187* 06/06/2014 1159   AST 64* 05/29/2014   ALT 179* 06/06/2014 1159   ALT 70* 05/29/2014   BILITOT 0.70 06/06/2014 1159   BILITOT 0.5 04/10/2014 0814         Impression and Plan: [name] [name] is 80 year old gentleman. He has metastatic pancreatic cancer. Despite doing well with gemcitabine, his cancer has progressed.  He still has a decent performance status so I think we could probably try to give 1 more attempt at treatment.  I think that Xeloda would be reasonable. I would dose him at 1500 mg twice a day for 14 days on and 7 days off.  His friend came with him. She is a Marine scientist. She understands what we are talking about. I went over the chemotherapy side effects. I told him about the  possibility of diarrhea, mouth sores, redness of the hands and feet. His blood counts may be affected.  I noticed that his liver tests were quite elevated. We will have to watch out with these. It is possible that he has more disease than we see on the scans.  He lives in a assisted living facility. Hopefully, they will be able to give treatment there.  I spent about 40-45 minutes with him. I went over the CAT scan results. I went over the lab work. I went over the new chemotherapy. I told him that the chance of responding probably will be less  than 10-15%. He understands this.  I will give him 3 cycles of treatment and then repeat the scans.  I want to see him back in a couple weeks so we can follow-up on his liver function tests.     Volanda Napoleon, MD 11/18/20152:00 PM

## 2014-06-07 LAB — CANCER ANTIGEN 19-9: CA 19-9: 218.8 U/mL — ABNORMAL HIGH (ref <35.0)

## 2014-06-13 ENCOUNTER — Other Ambulatory Visit: Payer: Medicare Other | Admitting: Lab

## 2014-06-13 ENCOUNTER — Ambulatory Visit: Payer: Medicare Other

## 2014-06-20 ENCOUNTER — Ambulatory Visit: Payer: Medicare Other

## 2014-06-20 ENCOUNTER — Ambulatory Visit (HOSPITAL_BASED_OUTPATIENT_CLINIC_OR_DEPARTMENT_OTHER): Payer: Medicare Other | Admitting: Family

## 2014-06-20 ENCOUNTER — Ambulatory Visit (HOSPITAL_BASED_OUTPATIENT_CLINIC_OR_DEPARTMENT_OTHER): Payer: Medicare Other

## 2014-06-20 ENCOUNTER — Telehealth: Payer: Self-pay | Admitting: Hematology & Oncology

## 2014-06-20 ENCOUNTER — Ambulatory Visit: Payer: Medicare Other | Admitting: Hematology & Oncology

## 2014-06-20 ENCOUNTER — Other Ambulatory Visit (HOSPITAL_BASED_OUTPATIENT_CLINIC_OR_DEPARTMENT_OTHER): Payer: Medicare Other | Admitting: Lab

## 2014-06-20 ENCOUNTER — Encounter: Payer: Self-pay | Admitting: Family

## 2014-06-20 ENCOUNTER — Other Ambulatory Visit: Payer: Medicare Other | Admitting: Lab

## 2014-06-20 VITALS — BP 120/55 | HR 53 | Temp 97.7°F | Resp 18 | Ht 69.0 in | Wt 167.0 lb

## 2014-06-20 DIAGNOSIS — C787 Secondary malignant neoplasm of liver and intrahepatic bile duct: Secondary | ICD-10-CM | POA: Diagnosis not present

## 2014-06-20 DIAGNOSIS — C250 Malignant neoplasm of head of pancreas: Secondary | ICD-10-CM

## 2014-06-20 DIAGNOSIS — Z452 Encounter for adjustment and management of vascular access device: Secondary | ICD-10-CM

## 2014-06-20 DIAGNOSIS — C259 Malignant neoplasm of pancreas, unspecified: Secondary | ICD-10-CM

## 2014-06-20 DIAGNOSIS — C679 Malignant neoplasm of bladder, unspecified: Secondary | ICD-10-CM

## 2014-06-20 LAB — CBC WITH DIFFERENTIAL (CANCER CENTER ONLY)
BASO#: 0 10*3/uL (ref 0.0–0.2)
BASO%: 0.7 % (ref 0.0–2.0)
EOS ABS: 0.1 10*3/uL (ref 0.0–0.5)
EOS%: 2.9 % (ref 0.0–7.0)
HCT: 33.2 % — ABNORMAL LOW (ref 38.7–49.9)
HGB: 10.8 g/dL — ABNORMAL LOW (ref 13.0–17.1)
LYMPH#: 1.3 10*3/uL (ref 0.9–3.3)
LYMPH%: 27.8 % (ref 14.0–48.0)
MCH: 32.4 pg (ref 28.0–33.4)
MCHC: 32.5 g/dL (ref 32.0–35.9)
MCV: 100 fL — AB (ref 82–98)
MONO#: 0.3 10*3/uL (ref 0.1–0.9)
MONO%: 7.1 % (ref 0.0–13.0)
NEUT#: 2.8 10*3/uL (ref 1.5–6.5)
NEUT%: 61.5 % (ref 40.0–80.0)
Platelets: 214 10*3/uL (ref 145–400)
RBC: 3.33 10*6/uL — AB (ref 4.20–5.70)
RDW: 17.4 % — ABNORMAL HIGH (ref 11.1–15.7)
WBC: 4.5 10*3/uL (ref 4.0–10.0)

## 2014-06-20 LAB — CMP (CANCER CENTER ONLY)
ALT(SGPT): 44 U/L (ref 10–47)
AST: 35 U/L (ref 11–38)
Albumin: 3.2 g/dL — ABNORMAL LOW (ref 3.3–5.5)
Alkaline Phosphatase: 231 U/L — ABNORMAL HIGH (ref 26–84)
BUN: 22 mg/dL (ref 7–22)
CALCIUM: 9.4 mg/dL (ref 8.0–10.3)
CHLORIDE: 101 meq/L (ref 98–108)
CO2: 31 mEq/L (ref 18–33)
CREATININE: 1.1 mg/dL (ref 0.6–1.2)
Glucose, Bld: 166 mg/dL — ABNORMAL HIGH (ref 73–118)
Potassium: 4.5 mEq/L (ref 3.3–4.7)
Sodium: 144 mEq/L (ref 128–145)
Total Bilirubin: 0.8 mg/dl (ref 0.20–1.60)
Total Protein: 7.5 g/dL (ref 6.4–8.1)

## 2014-06-20 MED ORDER — SODIUM CHLORIDE 0.9 % IV SOLN
INTRAVENOUS | Status: DC
  Administered 2014-06-20: 12:00:00 via INTRAVENOUS

## 2014-06-20 MED ORDER — HEPARIN SOD (PORK) LOCK FLUSH 100 UNIT/ML IV SOLN
500.0000 [IU] | Freq: Once | INTRAVENOUS | Status: AC
  Administered 2014-06-20: 500 [IU] via INTRAVENOUS
  Filled 2014-06-20: qty 5

## 2014-06-20 MED ORDER — SODIUM CHLORIDE 0.9 % IJ SOLN
10.0000 mL | INTRAMUSCULAR | Status: DC | PRN
  Administered 2014-06-20: 10 mL via INTRAVENOUS
  Filled 2014-06-20: qty 10

## 2014-06-20 NOTE — Patient Instructions (Signed)
Dehydration, Adult Dehydration is when you lose more fluids from the body than you take in. Vital organs like the kidneys, brain, and heart cannot function without a proper amount of fluids and salt. Any loss of fluids from the body can cause dehydration.  CAUSES   Vomiting.  Diarrhea.  Excessive sweating.  Excessive urine output.  Fever. SYMPTOMS  Mild dehydration  Thirst.  Dry lips.  Slightly dry mouth. Moderate dehydration  Very dry mouth.  Sunken eyes.  Skin does not bounce back quickly when lightly pinched and released.  Dark urine and decreased urine production.  Decreased tear production.  Headache. Severe dehydration  Very dry mouth.  Extreme thirst.  Rapid, weak pulse (more than 100 beats per minute at rest).  Cold hands and feet.  Not able to sweat in spite of heat and temperature.  Rapid breathing.  Blue lips.  Confusion and lethargy.  Difficulty being awakened.  Minimal urine production.  No tears. DIAGNOSIS  Your caregiver will diagnose dehydration based on your symptoms and your exam. Blood and urine tests will help confirm the diagnosis. The diagnostic evaluation should also identify the cause of dehydration. TREATMENT  Treatment of mild or moderate dehydration can often be done at home by increasing the amount of fluids that you drink. It is best to drink small amounts of fluid more often. Drinking too much at one time can make vomiting worse. Refer to the home care instructions below. Severe dehydration needs to be treated at the hospital where you will probably be given intravenous (IV) fluids that contain water and electrolytes. HOME CARE INSTRUCTIONS   Ask your caregiver about specific rehydration instructions.  Drink enough fluids to keep your urine clear or pale yellow.  Drink small amounts frequently if you have nausea and vomiting.  Eat as you normally do.  Avoid:  Foods or drinks high in sugar.  Carbonated  drinks.  Juice.  Extremely hot or cold fluids.  Drinks with caffeine.  Fatty, greasy foods.  Alcohol.  Tobacco.  Overeating.  Gelatin desserts.  Wash your hands well to avoid spreading bacteria and viruses.  Only take over-the-counter or prescription medicines for pain, discomfort, or fever as directed by your caregiver.  Ask your caregiver if you should continue all prescribed and over-the-counter medicines.  Keep all follow-up appointments with your caregiver. SEEK MEDICAL CARE IF:  You have abdominal pain and it increases or stays in one area (localizes).  You have a rash, stiff neck, or severe headache.  You are irritable, sleepy, or difficult to awaken.  You are weak, dizzy, or extremely thirsty. SEEK IMMEDIATE MEDICAL CARE IF:   You are unable to keep fluids down or you get worse despite treatment.  You have frequent episodes of vomiting or diarrhea.  You have blood or green matter (bile) in your vomit.  You have blood in your stool or your stool looks black and tarry.  You have not urinated in 6 to 8 hours, or you have only urinated a small amount of very dark urine.  You have a fever.  You faint. MAKE SURE YOU:   Understand these instructions.  Will watch your condition.  Will get help right away if you are not doing well or get worse. Document Released: 07/06/2005 Document Revised: 09/28/2011 Document Reviewed: 02/23/2011 Exit[employer] Patient Information 2015 [employer]. This information is not intended to replace advice given to you by your health care provider. Make sure you discuss any questions you have with your health care   provider.  

## 2014-06-20 NOTE — Telephone Encounter (Signed)
Per MD ok to schedule 12-18 instead of next week

## 2014-06-20 NOTE — Progress Notes (Signed)
[place]  [phone] [phone]  ID: [name] [name] OB: May 22, 1923 MR#: [id number] OIB#:[id number] Patient Care Team: Doe-Hyun Kyra Searles, DO as PCP - General (Internal Medicine)  DIAGNOSIS: Stage IV pancreatic cancer with mets to liver  INTERVAL HISTORY: [name] is here today for follow-up. He is on Xeloda and has had no side effects with it. He would like to get fluids today.  The swelling in his left leg is gone and he is wearing a compression stockings. He is still taking his Eliquis.  He denies fever, n/v, cough, rash, headache, dizziness, SOB, chest pain, palpitations, diarrhea, blood in urine or stool. The lactulose has helped with his constipation.  He's had no tenderness, numbness or tingling in his extremities.  His appetite is better but he is not drinking a lot of fluids. He is down 2 lbs since his last visit.   His CA 19-9 in late November was 218.   CURRENT TREATMENT: Xeloda 1500 mg BID (14 days on 7 off)  Eliquis 2.5 mg BID  REVIEW OF SYSTEMS: All other 10 point review of systems is negative.   PAST MEDICAL HISTORY: Past Medical History  Diagnosis Date  . Diverticulosis of colon (without mention of hemorrhage)   . Irritable bowel syndrome   . Personal history of malignant neoplasm of prostate     s/p  radioactive seed implants 2000  . Unspecified hereditary and idiopathic peripheral neuropathy   . Nonischemic cardiomyopathy cardiologist-  dr Aundra Dubin    EF 25%  per last echo 2012 and cardiologist note--  2007-  ef  40-45%;  2008- ef 35-40%;  2010- ef 30-35%;  2012- ef 25%  . Sinus bradycardia   . Hypertension   . Arthritis     hands  . Hyperlipidemia   . History of DVT of lower extremity     2002  ,   left leg  . History of adenomatous polyp of colon     2001  . History of basal cell carcinoma excision   . Anticoagulated on Coumadin   . Type 2 diabetes, diet controlled   . GERD (gastroesophageal reflux disease)   . LBBB (left  bundle branch block)   . Wears glasses   . Wears hearing aid     BILATERAL  . Unsteady gait   . DDD (degenerative disc disease)     cervical and lumbar  . DJD (degenerative joint disease)     hips  . Lower urinary tract symptoms (LUTS)   . 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 Coronary artery disease     per cath 2002  non-obstructive cad  . Chronic systolic heart failure   . H/O fracture of nose     12-30-2013-  pt fell at home open nose bone fx   PAST SURGICAL HISTORY: Past Surgical History  Procedure Laterality Date  . Knee arthroscopy Left 1991  . Ercp with sphincterotomy and stone removal  05/ 1993  &  05-11-2006  . Total knee arthroplasty  10/05/2011    Procedure: TOTAL KNEE ARTHROPLASTY;  Surgeon: Gearlean Alf, MD;  Location: [facility];  Service: Orthopedics;  Laterality: Right;  . Transurethral resection of bladder tumor N/A 09/30/2012    Procedure: TRANSURETHRAL RESECTION OF BLADDER TUMOR (TURBT);  Surgeon: Franchot Gallo, MD;  Location: [facility];  Service: Urology;  Laterality: N/A;  WITH MITOMYCIN INSTILLATION   .  Eus N/A 01/04/2014    Procedure: ESOPHAGEAL ENDOSCOPIC ULTRASOUND (EUS) RADIAL;  Surgeon: [name], MD;  Location: [facility];  Service: Endoscopy;  Laterality: N/A;  . Radioactive prostate seed implants  01/ 2000  . Mohs surgery  03/ 2009    LEFT EAR BASAL CELL CARCINOMA  . Tonsillectomy  AS CHILD  . Cholecystectomy open  1985  . Appendectomy  child  . Inguinal hernia repair Bilateral 1998    recurrent RIH  w/ repair 06-21-2006  . Total knee arthroplasty Left 02-21-2007    AND 04-21-2007  CLOSED MANIPULATION  . Cataract extraction w/ intraocular lens implant Right 2004  . Lumbar spine surgery  X2  . Cardiac catheterization  06-14-2001  dr Darnell Level brodie    non-obstructive CAD/  LM 30%,  pLAD 50%,  mLAD 30%,  RCA  &  CFX  irregularites ,  global hypokinesis,   ef 40%  . Cardiovascular stress test  02-07-2007   dr Darnell Level brodie    negative for ischemia,  low risk myoview perfusion study  . Transthoracic echocardiogram  01-28-2011    moderate LVH/  ef 25%/  diffuse hypokinesis/  mild LAE/  mild TR/  trivial AR & MR  . Cystoscopy w/ retrogrades Bilateral 03/09/2014    Procedure: CYSTOSCOPY WITH BILATERAL RETROGRADE PYELOGRAM BLADDER BX AND RENAL WASHINGS.;  Surgeon: Jorja Loa, MD;  Location: [facility];  Service: Urology;  Laterality: Bilateral;  . Eus N/A 04/05/2014    Procedure: UPPER ENDOSCOPIC ULTRASOUND (EUS) LINEAR;  Surgeon: [name], MD;  Location: [facility];  Service: Endoscopy;  Laterality: N/A;  radial linear  . Endoscopic retrograde cholangiopancreatography (ercp) with propofol N/A 04/12/2014    Procedure: ENDOSCOPIC RETROGRADE CHOLANGIOPANCREATOGRAPHY (ERCP) WITH PROPOFOL;  Surgeon: [name], MD;  Location: [facility];  Service: Endoscopy;  Laterality: N/A;  metal stent  . Biliary stent placement N/A 04/12/2014    Procedure: BILIARY STENT PLACEMENT;  Surgeon: [name], MD;  Location: [facility];  Service: Endoscopy;  Laterality: N/A;   FAMILY HISTORY Family History  Problem Relation Age of Onset  . Throat cancer Brother   . Coronary artery disease Father     deceased   GYNECOLOGIC HISTORY:  No LMP for male patient.   SOCIAL HISTORY:  History   Social History  . Marital Status: Widowed    Spouse Name: N/A    Number of Children: 0  . Years of Education: N/A   Occupational History  .     Social History Main Topics  . Smoking status: Former Smoker -- 1.00 packs/day for 10 years    Types: Cigarettes    Start date: 04/10/1954    Quit date: 09/10/1982  . Smokeless tobacco: Never Used     Comment: quit 32 years ago  . Alcohol Use: No  . Drug Use: No  . Sexual Activity: Not on file   Other Topics Concern  . Not on file   Social History Narrative   Married, no children.     ADVANCED DIRECTIVES: <no information>  HEALTH MAINTENANCE: History  Substance Use Topics  . Smoking status: Former Smoker -- 1.00 packs/day for 10 years    Types: Cigarettes    Start date: 04/10/1954    Quit date: 09/10/1982  . Smokeless tobacco: Never Used     Comment: quit 32 years ago  . Alcohol Use: No   Colonoscopy: PAP: Bone density: Lipid panel:  No Known Allergies  Current Outpatient Prescriptions  Medication Sig Dispense Refill  . apixaban (ELIQUIS) 2.5 MG TABS tablet Take 2.5 mg by mouth 2 (two) times daily.    . bisacodyl (DULCOLAX) 10 MG suppository Place 1 suppository (10 mg total) rectally daily as needed for moderate constipation. 12 suppository 0  . capecitabine (XELODA) 500 MG tablet Take 3 tablets (1,500 mg total) by mouth 2 (two) times daily after a meal. 84 tablet 4  . carvedilol (COREG) 12.5 MG tablet Take 12.5 mg by mouth 2 (two) times daily with a meal.    . docusate sodium 100 MG CAPS Take 100 mg by mouth 2 (two) times daily. 60 capsule 0  . HYDROcodone-acetaminophen (NORCO) 10-325 MG per tablet Take one tablet by mouth every 4 hours as needed for pain 180 tablet 0  . insulin glargine (LANTUS) 100 UNIT/ML injection Inject 3 Units into the skin at bedtime.    Marland Kitchen lactulose (CHRONULAC) 10 GM/15ML solution Take 15 mLs (10 g total) by mouth 2 (two) times daily as needed for mild constipation or moderate constipation. 240 mL 1  . lidocaine-prilocaine (EMLA) cream Apply 1 application topically as needed. 30 g 0  . LORazepam (ATIVAN) 0.5 MG tablet Take 0.5 mg by mouth at bedtime.    . nitroGLYCERIN (NITROSTAT) 0.4 MG SL tablet Place 1 tablet (0.4 mg total) under the tongue every 5 (five) minutes as needed for chest pain. 90 tablet 3  . Nutritional Supplements (GLUCERNA ADVANCE SHAKE) LIQD Take by mouth every morning.    . Omega-3 Fatty Acids (FISH OIL) 1000 MG CAPS Take by mouth.    . ondansetron (ZOFRAN) 8 MG tablet Take by mouth 2 (two) times daily. As needed     . pravastatin (PRAVACHOL) 40 MG tablet Take 40 mg by mouth every evening.     . pregabalin (LYRICA) 75 MG capsule Take 75 mg by mouth daily.    . ranitidine (ZANTAC) 150 MG tablet Take 150 mg by mouth 2 (two) times daily.    . temazepam (RESTORIL) 15 MG capsule Take 15 mg by mouth at bedtime as needed for sleep.    . VOLTAREN 1 % GEL   1  . [DISCONTINUED] Calcium Carbonate-Vitamin D (CALTRATE 600+D) 600-400 MG-UNIT per tablet Take 1 tablet by mouth daily.      No current facility-administered medications for this visit.   Facility-Administered Medications Ordered in Other Visits  Medication Dose Route Frequency Provider Last Rate Last Dose  . 0.9 %  sodium chloride infusion   Intravenous Continuous Eliezer Bottom, NP 500 mL/hr at 06/20/14 1130     OBJECTIVE: Filed Vitals:   06/20/14 1102  BP: 120/55  Pulse: 53  Temp: 97.7 F (36.5 C)  Resp: 18   Body mass index is 24.65 kg/(m^2). ECOG FS:1 - Symptomatic but completely ambulatory Ocular: Sclerae unicteric, pupils equal, round and reactive to light Ear-nose-throat: Oropharynx clear, dentition fair Lymphatic: No cervical or supraclavicular adenopathy Lungs no rales or rhonchi, good excursion bilaterally Heart regular rate and rhythm, no murmur appreciated Abd soft, nontender, positive bowel sounds MSK no focal spinal tenderness, no joint edema Neuro: non-focal, well-oriented, appropriate affect  LAB RESULTS: CMP     Component Value Date/Time   NA 144 06/20/2014 1041   NA 136* 05/29/2014   NA 136 04/10/2014 0814   K 4.5 06/20/2014 1041   K 4.3 05/29/2014   CL 101 06/20/2014 1041   CL 100 04/10/2014 0814   CO2 31 06/20/2014 1041   CO2 26 04/10/2014 0814  GLUCOSE 166* 06/20/2014 1041   GLUCOSE 196* 04/10/2014 0814   BUN 22 06/20/2014 1041   BUN 22* 05/29/2014   BUN 23 04/10/2014 0814   CREATININE 1.1 06/20/2014 1041   CREATININE 1.0 05/29/2014   CREATININE 1.00 04/10/2014 0814   CALCIUM 9.4 06/20/2014 1041    CALCIUM 9.6 04/10/2014 0814   PROT 7.5 06/20/2014 1041   PROT 7.3 04/10/2014 0814   ALBUMIN 4.0 04/10/2014 0814   AST 35 06/20/2014 1041   AST 64* 05/29/2014   ALT 44 06/20/2014 1041   ALT 70* 05/29/2014   ALKPHOS 231* 06/20/2014 1041   ALKPHOS 209* 05/29/2014   BILITOT 0.80 06/20/2014 1041   BILITOT 0.5 04/10/2014 0814   GFRNONAA 73* 03/31/2014 0421   GFRAA 85* 03/31/2014 0421   No results found for: SPEP Lab Results  Component Value Date   WBC 4.5 06/20/2014   NEUTROABS 2.8 06/20/2014   HGB 10.8* 06/20/2014   HCT 33.2* 06/20/2014   MCV 100* 06/20/2014   PLT 214 06/20/2014   No results found for: LABCA2 No components found for: [id number] No results for input(s): INR in the last 168 hours.  STUDIES: None  ASSESSMENT/PLAN: Mr. Leeper is a very pleasant 80 yo male with recently diagnosed stage IV pancreatic cancer with mets to the liver.  Recent scans showed progression of disease. He was switched to Xeloda.  He is taking 1500 mg BID 14 days on 7 days off for 3 cycles. We will repeat scans after his 3rd cycle is complete.  Today he finishes his fist 14 days on and will  Now have a week off. Dr. Marin Olp will see him next week with the beginning of cycle 2.   We will give him fluids today.  His CBC today was good. His ALT and AST are back down to normal.  All questions were answered. He knows to call the clinic with any problems, questions or concerns. We can certainly see him much sooner if necessary.  Eliezer Bottom, NP 06/20/2014 11:40 AM

## 2014-06-21 LAB — PREALBUMIN: Prealbumin: 15.1 mg/dL — ABNORMAL LOW (ref 17.0–34.0)

## 2014-06-21 LAB — CANCER ANTIGEN 19-9: CA 19 9: 231.9 U/mL — AB (ref <35.0)

## 2014-06-27 ENCOUNTER — Non-Acute Institutional Stay (SKILLED_NURSING_FACILITY): Payer: Medicare Other | Admitting: Nurse Practitioner

## 2014-06-27 ENCOUNTER — Encounter: Payer: Self-pay | Admitting: Nurse Practitioner

## 2014-06-27 DIAGNOSIS — C259 Malignant neoplasm of pancreas, unspecified: Secondary | ICD-10-CM

## 2014-06-27 DIAGNOSIS — K5900 Constipation, unspecified: Secondary | ICD-10-CM

## 2014-06-27 DIAGNOSIS — I82402 Acute embolism and thrombosis of unspecified deep veins of left lower extremity: Secondary | ICD-10-CM

## 2014-06-27 DIAGNOSIS — K219 Gastro-esophageal reflux disease without esophagitis: Secondary | ICD-10-CM

## 2014-06-27 DIAGNOSIS — G4700 Insomnia, unspecified: Secondary | ICD-10-CM

## 2014-06-27 DIAGNOSIS — I10 Essential (primary) hypertension: Secondary | ICD-10-CM

## 2014-06-27 DIAGNOSIS — G609 Hereditary and idiopathic neuropathy, unspecified: Secondary | ICD-10-CM

## 2014-06-27 DIAGNOSIS — E119 Type 2 diabetes mellitus without complications: Secondary | ICD-10-CM

## 2014-06-27 NOTE — Assessment & Plan Note (Signed)
Takes Pregabalin 75mg daily. Ambulates with walker on unit   

## 2014-06-27 NOTE — Assessment & Plan Note (Signed)
Blood pressure controlled on Coreg 12.5mg bid 

## 2014-06-27 NOTE — Assessment & Plan Note (Signed)
06/06/14 Oncology: stage IV pancreatic cancer with Mets to liver: CA 19-9 400--194. CT showed more liver metastases. Xeloda 1500mg  bid x 14days on and 7 days off. 3 cycles of tx and then repeat the scans.  Epigastric pain is managed HC/APAP 10/325 q4prn. Prn Lorazepam 0.63m g q6h, prn Ondansetron 8mg  bid, and Compazine 5mg  q6h prn available to him.  06/27/14 Capecitabine 1500mg  bid x 14 days completed.

## 2014-06-27 NOTE — Assessment & Plan Note (Signed)
0/16/15 Doppler LLE: DVT at left femoral and popliteal veins not completely occlusive. continue Eliquis 2.5mg  bid. Left edema seen LLE

## 2014-06-27 NOTE — Assessment & Plan Note (Signed)
Adequately managed with MiraLax bid prn, Enulose 15mg bid prn, Colace 100mg bid, Bisacodyl 10mg suppository qd prn.  

## 2014-06-27 NOTE — Assessment & Plan Note (Signed)
Takes ranitidine 150mg bid.    

## 2014-06-27 NOTE — Assessment & Plan Note (Signed)
Takes Temazepam 15mg qhs prn for sleep.     

## 2014-06-27 NOTE — Progress Notes (Signed)
Patient ID: [name] [name], male   DOB: 15-Jan-1923, 80 y.o.   MRN: [id number]   Code Status: Full code  No Known Allergies  Chief Complaint  Patient presents with  . Medical Management of Chronic Issues    HPI: Patient is a 80 y.o. male seen in the SNF at [facility] today for evaluation of chronic medical conditions.    Hospitalized 03/29/2014 -04/04/2014 for pancreatic mass, FTT, CHF. He was admitted 03/29/14 with chief complaint of malaise, decreased appetite, generalized weakness and hematuria. A CT scan done on admission showed a 4.9 cm enhancing lesion adjacent to the pancreatic head/uncinate process worrisome for a primary pancreatic adenocarcinoma and suspected multifocal hepatic metastasis. This mass was noted back in June when his urologist ordered a CT scan of his abdomen/pelvis, and he was referred to GI as an outpatient for further evaluation. An EUS with biopsy was planned however the patient did not hold his Coumadin as instructed and the biopsy was canceled. The mass was not detectable on the ultrasound. Further evaluation was subsequently halted given the patient's overall functional status and comorbidities. His wife died 3 weeks ago.    Problem List Items Addressed This Visit    Type 2 diabetes mellitus    12/20/13 Hgb A1c 7.3 04/09/14 CBG am 150-180, pm 186/235-adding Lantus 3 units qd qhs.  05/28/14 update Hgb A1c 7.1 06/27/14 continue Lantus 3 units daily.    Pancreatic cancer    06/06/14 Oncology: stage IV pancreatic cancer with Mets to liver: CA 19-9 400--194. CT showed more liver metastases. Xeloda 1500mg  bid x 14days on and 7 days off. 3 cycles of tx and then repeat the scans.  Epigastric pain is managed HC/APAP 10/325 q4prn. Prn Lorazepam 0.20m g q6h, prn Ondansetron 8mg  bid, and Compazine 5mg  q6h prn available to him.  06/27/14 Capecitabine 1500mg  bid x 14 days completed.        Insomnia    Takes Temazepam 15mg  qhs prn for sleep.       HYPERTENSION,  BENIGN - Primary    Blood pressure controlled on Coreg 12.5mg  bid.       Hereditary and idiopathic peripheral neuropathy    Takes Pregabalin 75mg  daily. Ambulates with walker on unit     GERD    Takes ranitidine 150mg  bid.      Constipation    Adequately managed with MiraLax bid prn, Enulose 15mg  bid prn, Colace 100mg  bid, Bisacodyl 10mg  suppository qd prn.        Acute thromboembolism of deep veins of lower extremity    0/16/15 Doppler LLE: DVT at left femoral and popliteal veins not completely occlusive. continue Eliquis 2.5mg  bid. Left edema seen LLE         Review of Systems:  Review of Systems  Constitutional: Positive for fever and weight loss. Negative for chills, malaise/fatigue and diaphoresis.       About #15 Ibs in the past year. Generalized weakness.  HENT: Negative for congestion, ear discharge, ear pain, hearing loss, nosebleeds, sore throat and tinnitus.   Eyes: Negative for blurred vision, double vision, photophobia, pain, discharge and redness.  Respiratory: Negative for cough, hemoptysis, sputum production, shortness of breath, wheezing and stridor.   Cardiovascular: Positive for leg swelling. Negative for chest pain, palpitations, orthopnea, claudication and PND.       New LLE  Gastrointestinal: Positive for abdominal pain. Negative for heartburn, nausea, vomiting, diarrhea, constipation, blood in stool and melena.       Epigastric area  dull pain. He said it has been hurting for a long time  Genitourinary: Positive for frequency. Negative for dysuria, urgency, hematuria and flank pain.  Musculoskeletal: Negative for myalgias, back pain, joint pain, falls and neck pain.  Skin: Negative for itching and rash.       Port A Cath Intergluteal cleft redness.   Neurological: Positive for weakness. Negative for dizziness, tingling, tremors, sensory change, speech change, focal weakness, seizures, loss of consciousness and headaches.  Endo/Heme/Allergies: Negative  for environmental allergies and polydipsia. Does not bruise/bleed easily.  Psychiatric/Behavioral: Negative for depression, suicidal ideas, hallucinations, memory loss and substance abuse. The patient is not nervous/anxious and does not have insomnia.      Past Medical History  Diagnosis Date  . Diverticulosis of colon (without mention of hemorrhage)   . Irritable bowel syndrome   . Personal history of malignant neoplasm of prostate     s/p  radioactive seed implants 2000  . Unspecified hereditary and idiopathic peripheral neuropathy   . Nonischemic cardiomyopathy cardiologist-  dr Aundra Dubin    EF 25%  per last echo 2012 and cardiologist note--  2007-  ef  40-45%;  2008- ef 35-40%;  2010- ef 30-35%;  2012- ef 25%  . Sinus bradycardia   . Hypertension   . Arthritis     hands  . Hyperlipidemia   . History of DVT of lower extremity     2002  ,   left leg  . History of adenomatous polyp of colon     2001  . History of basal cell carcinoma excision   . Anticoagulated on Coumadin   . Type 2 diabetes, diet controlled   . GERD (gastroesophageal reflux disease)   . LBBB (left bundle branch block)   . Wears glasses   . Wears hearing aid     BILATERAL  . Unsteady gait   . DDD (degenerative disc disease)     cervical and lumbar  . DJD (degenerative joint disease)     hips  . Lower urinary tract symptoms (LUTS)   . 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 Coronary artery disease     per cath 2002  non-obstructive cad  . Chronic systolic heart failure   . H/O fracture of nose     12-30-2013-  pt fell at home open nose bone fx   Past Surgical History  Procedure Laterality Date  . Knee arthroscopy Left 1991  . Ercp with sphincterotomy and stone removal  05/ 1993  &  05-11-2006  . Total knee arthroplasty  10/05/2011    Procedure: TOTAL KNEE ARTHROPLASTY;  Surgeon: Gearlean Alf, MD;  Location: [facility]  [facility];  Service: Orthopedics;  Laterality: Right;  . Transurethral resection of bladder tumor N/A 09/30/2012    Procedure: TRANSURETHRAL RESECTION OF BLADDER TUMOR (TURBT);  Surgeon: Franchot Gallo, MD;  Location: [facility];  Service: Urology;  Laterality: N/A;  WITH MITOMYCIN INSTILLATION   . Eus N/A 01/04/2014    Procedure: ESOPHAGEAL ENDOSCOPIC ULTRASOUND (EUS) RADIAL;  Surgeon: [name], MD;  Location: [facility];  Service: Endoscopy;  Laterality: N/A;  . Radioactive prostate seed implants  01/ 2000  . Mohs surgery  03/ 2009    LEFT EAR BASAL CELL CARCINOMA  . Tonsillectomy  AS CHILD  . Cholecystectomy open  1985  . Appendectomy  child  . Inguinal hernia repair Bilateral 1998  recurrent RIH  w/ repair 06-21-2006  . Total knee arthroplasty Left 02-21-2007    AND 04-21-2007  CLOSED MANIPULATION  . Cataract extraction w/ intraocular lens implant Right 2004  . Lumbar spine surgery  X2  . Cardiac catheterization  06-14-2001  dr Darnell Level brodie    non-obstructive CAD/  LM 30%,  pLAD 50%,  mLAD 30%,  RCA  &  CFX  irregularites ,  global hypokinesis,  ef 40%  . Cardiovascular stress test  02-07-2007   dr Darnell Level brodie    negative for ischemia,  low risk myoview perfusion study  . Transthoracic echocardiogram  01-28-2011    moderate LVH/  ef 25%/  diffuse hypokinesis/  mild LAE/  mild TR/  trivial AR & MR  . Cystoscopy w/ retrogrades Bilateral 03/09/2014    Procedure: CYSTOSCOPY WITH BILATERAL RETROGRADE PYELOGRAM BLADDER BX AND RENAL WASHINGS.;  Surgeon: Jorja Loa, MD;  Location: [facility];  Service: Urology;  Laterality: Bilateral;  . Eus N/A 04/05/2014    Procedure: UPPER ENDOSCOPIC ULTRASOUND (EUS) LINEAR;  Surgeon: [name], MD;  Location: [facility];  Service: Endoscopy;  Laterality: N/A;  radial linear  . Endoscopic retrograde cholangiopancreatography (ercp) with propofol N/A 04/12/2014    Procedure: ENDOSCOPIC RETROGRADE CHOLANGIOPANCREATOGRAPHY  (ERCP) WITH PROPOFOL;  Surgeon: [name], MD;  Location: [facility];  Service: Endoscopy;  Laterality: N/A;  metal stent  . Biliary stent placement N/A 04/12/2014    Procedure: BILIARY STENT PLACEMENT;  Surgeon: [name], MD;  Location: [facility];  Service: Endoscopy;  Laterality: N/A;   Social History:   reports that he quit smoking about 31 years ago. His smoking use included Cigarettes. He started smoking about 60 years ago. He has a 10 pack-year smoking history. He has never used smokeless tobacco. He reports that he does not drink alcohol or use illicit drugs.  Family History  Problem Relation Age of Onset  . Throat cancer Brother   . Coronary artery disease Father     deceased    Medications: Patient's Medications  New Prescriptions   No medications on file  Previous Medications   APIXABAN (ELIQUIS) 2.5 MG TABS TABLET    Take 2.5 mg by mouth 2 (two) times daily.   BISACODYL (DULCOLAX) 10 MG SUPPOSITORY    Place 1 suppository (10 mg total) rectally daily as needed for moderate constipation.   CAPECITABINE (XELODA) 500 MG TABLET    Take 500 mg by mouth 2 (two) times daily after a meal. TAKES 3 TABS (1500) MG BID  ///  ON 14 DAYS OFF 7 DAYS FOR 3 CYCLES.   STARTED 06/06/14   CARVEDILOL (COREG) 12.5 MG TABLET    Take 12.5 mg by mouth 2 (two) times daily with a meal.   DOCUSATE SODIUM 100 MG CAPS    Take 100 mg by mouth 2 (two) times daily.   HYDROCODONE-ACETAMINOPHEN (NORCO) 10-325 MG PER TABLET    Take one tablet by mouth every 4 hours as needed for pain   INSULIN GLARGINE (LANTUS) 100 UNIT/ML INJECTION    Inject 3 Units into the skin at bedtime.   LACTULOSE (CHRONULAC) 10 GM/15ML SOLUTION    Take 15 mLs (10 g total) by mouth 2 (two) times daily as needed for mild constipation or moderate constipation.   LIDOCAINE-PRILOCAINE (EMLA) CREAM    Apply 1 application topically as needed.   LORAZEPAM (ATIVAN) 0.5 MG TABLET    Take 0.5 mg by mouth at bedtime.  NITROGLYCERIN  (NITROSTAT) 0.4 MG SL TABLET    Place 1 tablet (0.4 mg total) under the tongue every 5 (five) minutes as needed for chest pain.   NUTRITIONAL SUPPLEMENTS (GLUCERNA ADVANCE SHAKE) LIQD    Take by mouth every morning.   OMEGA-3 FATTY ACIDS (FISH OIL) 1000 MG CAPS    Take by mouth.   ONDANSETRON (ZOFRAN) 8 MG TABLET    Take by mouth 2 (two) times daily. As needed   PRAVASTATIN (PRAVACHOL) 40 MG TABLET    Take 40 mg by mouth every evening.    PREGABALIN (LYRICA) 75 MG CAPSULE    Take 75 mg by mouth daily.   RANITIDINE (ZANTAC) 150 MG TABLET    Take 150 mg by mouth 2 (two) times daily.   TEMAZEPAM (RESTORIL) 15 MG CAPSULE    Take 15 mg by mouth at bedtime as needed for sleep.   VOLTAREN 1 % GEL      Modified Medications   No medications on file  Discontinued Medications   No medications on file     Physical Exam: Physical Exam  Constitutional: He is oriented to person, place, and time. He appears well-developed and well-nourished.  HENT:  Head: Normocephalic and atraumatic.  Mouth/Throat: Oropharynx is clear and moist.  Eyes: Conjunctivae and EOM are normal. Pupils are equal, round, and reactive to light. Right eye exhibits no discharge. Left eye exhibits no discharge. No scleral icterus.  Neck: Normal range of motion. Neck supple. No JVD present. No tracheal deviation present. No thyromegaly present.  Cardiovascular: Normal rate, regular rhythm and normal heart sounds.   Pulmonary/Chest: Effort normal and breath sounds normal. No stridor. He has no wheezes. He has no rales. He exhibits no tenderness.  Abdominal: Soft. Bowel sounds are normal. He exhibits no distension. There is no tenderness. There is no rebound and no guarding.  Minimal epigastric tenderness.  No ascites  Musculoskeletal: Normal range of motion. He exhibits edema. He exhibits no tenderness.  LLE  Lymphadenopathy:    He has no cervical adenopathy.  Neurological: He is alert and oriented to person, place, and time. No  cranial nerve deficit.  Skin: Skin is warm and dry. No rash noted. No erythema. No pallor.  Intergluteal cleft redness.   Psychiatric: He has a normal mood and affect. His behavior is normal.    Filed Vitals:   06/27/14 1539  BP: 130/70  Pulse: 78  Temp: 97.2 F (36.2 C)  TempSrc: Tympanic  Resp: 20      Labs reviewed: Basic Metabolic Panel:  Recent Labs  05/29/14 05/30/14 0957 06/06/14 1159 06/20/14 1041  NA 136* 141 144 144  K 4.3 4.3 4.7 4.5  CL  --  102 101 101  CO2  --  27 26 31   GLUCOSE  --  172* 184* 166*  BUN 22* 20 19 22   CREATININE 1.0 1.0 1.1 1.1  CALCIUM  --  9.2 9.0 9.4  TSH 1.15  --   --   --    Liver Function Tests:  Recent Labs  03/29/14 1610 03/30/14 0424  04/10/14 0814  05/30/14 0957 06/06/14 1159 06/20/14 1041  AST 130* 63*  < > 17  < > 41* 187* 35  ALT 181* 118*  < > 20  < > 62* 179* 44  ALKPHOS 480* 344*  < > 146*  < > 184* 300* 231*  BILITOT 1.0 0.7  --  0.5  < > 0.60 0.70 0.80  PROT 7.1 5.7*  --  7.3  < > 6.9 7.2 7.5  ALBUMIN 3.4* 2.8*  --  4.0  --   --   --   --   < > = values in this interval not displayed.  Recent Labs  12/19/13 1514 03/29/14 1610  LIPASE 8.0* 10*   No results for input(s): AMMONIA in the last 8760 hours. CBC:  Recent Labs  05/30/14 0957 06/06/14 1159 06/20/14 1041  WBC 5.3 5.2 4.5  NEUTROABS 3.6 3.5 2.8  HGB 10.7* 10.9* 10.8*  HCT 32.9* 33.8* 33.2*  MCV 98 99* 100*  PLT 256 205 214   Lipid Panel: No results for input(s): CHOL, HDL, LDLCALC, TRIG, CHOLHDL, LDLDIRECT in the last 8760 hours.  Past Procedures:  03/29/14 CT abd and pelvis w CM:  IMPRESSION:  4.9 cm enhancing lesion adjacent to the pancreatic head/uncinate  process, worrisome for primary pancreatic adenocarcinoma,  progressed.  Tumor likely involves the SMA/SMV and undersurface of the portal  vein. 9 mm short axis node in the porta hepatitis.  Suspected multifocal hepatic metastases, including a 3.2 cm lesion  in the right  hepatic dome.   03/30/14 MR w wo CM abd:   IMPRESSION:  1. Subtle amorphous mass in the pancreatic head and proximal  pancreatic body currently measuring 4.4 x 3.2 cm, causing distal  common bile duct obstruction and demonstrating vascular involvement  (SMA, SMV, splenic vein, splenic portal confluence and proximal  portal vein), as above, highly concerning for primary pancreatic  neoplasm.  2. Although there do appear to be multiple ill-defined hepatic  lesions, concerning for metastatic disease, these were poorly  evaluated on today's examination secondary to patient respiratory  motion. Continued attention on any future followup studies is  recommended, as metastatic disease to the liver is suspected.  3. Severe intra and extrahepatic biliary ductal dilatation.   05/04/14 Doppler LLE: DVT at left femoral and popliteal veins not completely occlusive. Will anticoagulate with Eliquis 2.5mg  bid.   Assessment/Plan Type 2 diabetes mellitus 12/20/13 Hgb A1c 7.3 04/09/14 CBG am 150-180, pm 186/235-adding Lantus 3 units qd qhs.  05/28/14 update Hgb A1c 7.1 06/27/14 continue Lantus 3 units daily.  Hereditary and idiopathic peripheral neuropathy Takes Pregabalin 75mg  daily. Ambulates with walker on unit   HYPERTENSION, BENIGN Blood pressure controlled on Coreg 12.5mg  bid.     GERD Takes ranitidine 150mg  bid.    Constipation Adequately managed with MiraLax bid prn, Enulose 15mg  bid prn, Colace 100mg  bid, Bisacodyl 10mg  suppository qd prn.      Pancreatic cancer 06/06/14 Oncology: stage IV pancreatic cancer with Mets to liver: CA 19-9 400--194. CT showed more liver metastases. Xeloda 1500mg  bid x 14days on and 7 days off. 3 cycles of tx and then repeat the scans.  Epigastric pain is managed HC/APAP 10/325 q4prn. Prn Lorazepam 0.24m g q6h, prn Ondansetron 8mg  bid, and Compazine 5mg  q6h prn available to him.  06/27/14 Capecitabine 1500mg  bid x 14 days completed.       Insomnia Takes Temazepam 15mg  qhs prn for sleep.     Acute thromboembolism of deep veins of lower extremity 0/16/15 Doppler LLE: DVT at left femoral and popliteal veins not completely occlusive. continue Eliquis 2.5mg  bid. Left edema seen LLE      Family/ Staff Communication: observe the patient.   Goals of Care: SNF  Labs/tests ordered: none

## 2014-06-27 NOTE — Assessment & Plan Note (Signed)
12/20/13 Hgb A1c 7.3 04/09/14 CBG am 150-180, pm 186/235-adding Lantus 3 units qd qhs.  05/28/14 update Hgb A1c 7.1 06/27/14 continue Lantus 3 units daily. 

## 2014-06-28 ENCOUNTER — Telehealth: Payer: Self-pay | Admitting: Cardiology

## 2014-06-28 ENCOUNTER — Encounter: Payer: Self-pay | Admitting: Cardiology

## 2014-06-28 ENCOUNTER — Ambulatory Visit (INDEPENDENT_AMBULATORY_CARE_PROVIDER_SITE_OTHER): Payer: Medicare Other | Admitting: Cardiology

## 2014-06-28 VITALS — BP 110/46 | HR 61 | Ht 69.0 in | Wt 169.0 lb

## 2014-06-28 DIAGNOSIS — I2510 Atherosclerotic heart disease of native coronary artery without angina pectoris: Secondary | ICD-10-CM

## 2014-06-28 DIAGNOSIS — I5022 Chronic systolic (congestive) heart failure: Secondary | ICD-10-CM

## 2014-06-28 NOTE — Telephone Encounter (Signed)
Spoke with Dupuyer AND GAVE AN UP OF PT'S OFFICE VISIT WITH DR Pocatello.

## 2014-06-28 NOTE — Progress Notes (Signed)
Patient ID: [name] [name], male   DOB: Jun 26, 1923, 80 y.o.   MRN: [id number] PCP: Dr. Lenna Gilford  80 yo with history of nonischemic cardiomyopathy presents for cardiology followup.  Last echo was in 7/12 with moderately dilated LV and EF 25%.  He has been found to have metastatic pancreatic cancer and is currently getting treatment with Xeloda.  He was also found to have an acute DVT in 10/15 and is now on apixaban (instead of warfarin).  He is no longer taking Lasix or losartan.    [name] [name] seems to be holding his own.  He is now in the nursing facility at [facility]. He walks with a walker for balance but denies exertional dyspnea walking on flat ground.  His weight is only down 2 lbs since last appointment.  No chest pain.  Occasional lightheadedness but no syncope or falls.  No orthopnea/PND.    Labs (2/12): K 4.4, creatinine 1.0, LDL 86, HDL 55 Labs (8/12): K 5.3, creatinine 1.1, LDL 98, HDL 57 Labs (9/12): BNP 206 Labs (10/12): K 4.7, creatinine 1.0 Labs (3/13): K 3.5, creatinine 0.94 Labs (8/13): K 4.1, creatinine 1.2 Labs (3/14): K 5, creatinine 1.03 Labs (8/14): LDL 83, K 5.1, creatinine 1.2 Labs (10/14): K 4.2, creatinine 1.2 Labs (7/15): K 4.3, creatinine 1.3 Labs (12/15): K 4.5, creatinine 1.1, HCT 33.2  PMH: 1. Nonischemic CMP: LHC (2002) with nonobstructive CAD.  Echo (12/10): EF 30-35%, diffuse hypokinesis worse in the inferior and posterior walls, mild AI, mild [name].  Echo (7/12): moderately dilated LV, EF 25%, diffuse hypokinesis, PA systolic pressure 49 mmHg.  2. H/o appendectomy 3. Recurrent DVTs: Now on apixaban.  4. IBS 5. H/o cholecystectomy 6. ERCP with sphincterotomy and stone removal 7. Prostate cancer: seed implantation.  8. Hernia repair 9. Bilateral TKR 10. Diabetes mellitus: Diet-controlled 11. Hyperlipidemia 12. Low back pain 13. LBBB/IVCD 14. Bladder cancer: TURBT 3/14.  15. Pancreatic cancer: Metastatic to liver. Currently getting Xeloda chemotherapy.    SH: Married, no children.  Quit smoking.  Rare ETOH.   FH: Father with CAD.  ROS: All systems reviewed and negative except as per HPI.   Current Outpatient Prescriptions  Medication Sig Dispense Refill  . apixaban (ELIQUIS) 2.5 MG TABS tablet Take 2.5 mg by mouth 2 (two) times daily.    . bisacodyl (DULCOLAX) 10 MG suppository Place 1 suppository (10 mg total) rectally daily as needed for moderate constipation. 12 suppository 0  . capecitabine (XELODA) 500 MG tablet TAKES 3 TABS (1500) MG BID  ///  ON 14 DAYS OFF 7 DAYS FOR 3 CYCLES.   STARTED 06/06/14    . carvedilol (COREG) 12.5 MG tablet Take 12.5 mg by mouth 2 (two) times daily with a meal.    . docusate sodium 100 MG CAPS Take 100 mg by mouth 2 (two) times daily. 60 capsule 0  . HYDROcodone-acetaminophen (NORCO) 10-325 MG per tablet Take one tablet by mouth every 4 hours as needed for pain 180 tablet 0  . insulin glargine (LANTUS) 100 UNIT/ML injection Inject 3 Units into the skin at bedtime.    . lidocaine-prilocaine (EMLA) cream Apply 1 application topically as needed. 30 g 0  . LORazepam (ATIVAN) 0.5 MG tablet Take 0.5 mg by mouth at bedtime.    . nitroGLYCERIN (NITROSTAT) 0.4 MG SL tablet Place 1 tablet (0.4 mg total) under the tongue every 5 (five) minutes as needed for chest pain. 90 tablet 3  . Nutritional Supplements (GLUCERNA ADVANCE SHAKE) LIQD Take  by mouth every morning.    . Omega-3 Fatty Acids (FISH OIL) 1000 MG CAPS Take by mouth.    . ondansetron (ZOFRAN) 8 MG tablet Take by mouth 2 (two) times daily. As needed    . polyethylene glycol (MIRALAX / GLYCOLAX) packet Take 17 g by mouth 2 (two) times daily as needed for mild constipation.    . pravastatin (PRAVACHOL) 40 MG tablet Take 40 mg by mouth every evening.     . pregabalin (LYRICA) 75 MG capsule Take 75 mg by mouth daily.    . prochlorperazine (COMPAZINE) 5 MG tablet Take 5 mg by mouth every 6 (six) hours as needed for nausea or vomiting.    . ranitidine (ZANTAC)  150 MG tablet Take 150 mg by mouth 2 (two) times daily.    . temazepam (RESTORIL) 15 MG capsule Take 15 mg by mouth at bedtime as needed for sleep.    . [DISCONTINUED] Calcium Carbonate-Vitamin D (CALTRATE 600+D) 600-400 MG-UNIT per tablet Take 1 tablet by mouth daily.      No current facility-administered medications for this visit.    BP 110/46 mmHg  Pulse 61  Ht 5\' 9"  (1.753 m)  Wt 169 lb (76.658 kg)  BMI 24.95 kg/m2 General: elderly, NAD Neck: No JVD, no thyromegaly or thyroid nodule.  Lungs: Clear to auscultation bilaterally with normal respiratory effort. CV: Nondisplaced PMI.  Heart regular S1/S2, no S3/S4, 2/6 HSM at apex.  No ankle edema bilaterally.  No carotid bruit.  Normal pedal pulses.  Abdomen: Soft, nontender, no hepatosplenomegaly, no distention.  Neurologic: Alert and oriented x 3.  Psych: Normal affect. Extremities: No clubbing or cyanosis.   Assessment/Plan: SYSTOLIC HEART FAILURE, CHRONIC  Long history of nonischemic cardiomyopathy. Last EF 25% in 2012. NYHA class II symptoms. No JVD, volume status looks ok today.   - EF not assessed for a few years but do not think there is much utility to repeat echo right now. - Continue current Coreg. - He can stay off Lasix for now, looks euvolemic.   - Given orthostatic symptoms and BP on the lower side, I think that he can stay off losartan.  DVT  Recurrent DVT in setting of pancreatic cancer, now on apixaban at reduced dose.   Pancreatic Cancer Metastatic.  Seems to be doing fairly well currently.   [name] [name] 06/28/2014

## 2014-06-28 NOTE — Telephone Encounter (Signed)
New Message         Calling to find out if Dr. Aundra Dubin gave pt any orders that need to be carried out. Please call back and advise or fax info to [phone].

## 2014-06-28 NOTE — Patient Instructions (Signed)
Your physician wants you to follow-up in:6 months with Dr. McLean.  You will receive a reminder letter in the mail two months in advance. If you don't receive a letter, please call our office to schedule the follow-up appointment.   

## 2014-06-28 NOTE — Telephone Encounter (Signed)
Left charity a message to call back.

## 2014-07-02 ENCOUNTER — Telehealth: Payer: Self-pay | Admitting: *Deleted

## 2014-07-02 ENCOUNTER — Inpatient Hospital Stay (HOSPITAL_COMMUNITY)
Admission: EM | Admit: 2014-07-02 | Discharge: 2014-07-06 | DRG: 435 | Disposition: A | Discharge status: Skilled Nursing Facility | Payer: Medicare Other | Attending: Internal Medicine | Admitting: Internal Medicine

## 2014-07-02 ENCOUNTER — Emergency Department (HOSPITAL_COMMUNITY): Payer: Medicare Other

## 2014-07-02 ENCOUNTER — Encounter (HOSPITAL_COMMUNITY): Payer: Self-pay

## 2014-07-02 DIAGNOSIS — E119 Type 2 diabetes mellitus without complications: Secondary | ICD-10-CM | POA: Diagnosis present

## 2014-07-02 DIAGNOSIS — R059 Cough, unspecified: Secondary | ICD-10-CM

## 2014-07-02 DIAGNOSIS — C787 Secondary malignant neoplasm of liver and intrahepatic bile duct: Secondary | ICD-10-CM | POA: Diagnosis present

## 2014-07-02 DIAGNOSIS — G609 Hereditary and idiopathic neuropathy, unspecified: Secondary | ICD-10-CM | POA: Diagnosis present

## 2014-07-02 DIAGNOSIS — C259 Malignant neoplasm of pancreas, unspecified: Principal | ICD-10-CM | POA: Diagnosis present

## 2014-07-02 DIAGNOSIS — D649 Anemia, unspecified: Secondary | ICD-10-CM | POA: Diagnosis present

## 2014-07-02 DIAGNOSIS — N390 Urinary tract infection, site not specified: Secondary | ICD-10-CM | POA: Diagnosis present

## 2014-07-02 DIAGNOSIS — Z85828 Personal history of other malignant neoplasm of skin: Secondary | ICD-10-CM | POA: Diagnosis not present

## 2014-07-02 DIAGNOSIS — Z8546 Personal history of malignant neoplasm of prostate: Secondary | ICD-10-CM

## 2014-07-02 DIAGNOSIS — R05 Cough: Secondary | ICD-10-CM

## 2014-07-02 DIAGNOSIS — A419 Sepsis, unspecified organism: Secondary | ICD-10-CM

## 2014-07-02 DIAGNOSIS — C801 Malignant (primary) neoplasm, unspecified: Secondary | ICD-10-CM

## 2014-07-02 DIAGNOSIS — I447 Left bundle-branch block, unspecified: Secondary | ICD-10-CM | POA: Diagnosis present

## 2014-07-02 DIAGNOSIS — E785 Hyperlipidemia, unspecified: Secondary | ICD-10-CM | POA: Diagnosis present

## 2014-07-02 DIAGNOSIS — Z87440 Personal history of urinary (tract) infections: Secondary | ICD-10-CM

## 2014-07-02 DIAGNOSIS — Z86718 Personal history of other venous thrombosis and embolism: Secondary | ICD-10-CM | POA: Diagnosis not present

## 2014-07-02 DIAGNOSIS — Z808 Family history of malignant neoplasm of other organs or systems: Secondary | ICD-10-CM

## 2014-07-02 DIAGNOSIS — R509 Fever, unspecified: Secondary | ICD-10-CM | POA: Diagnosis not present

## 2014-07-02 DIAGNOSIS — I428 Other cardiomyopathies: Secondary | ICD-10-CM | POA: Diagnosis present

## 2014-07-02 DIAGNOSIS — I429 Cardiomyopathy, unspecified: Secondary | ICD-10-CM | POA: Diagnosis present

## 2014-07-02 DIAGNOSIS — Z8551 Personal history of malignant neoplasm of bladder: Secondary | ICD-10-CM

## 2014-07-02 DIAGNOSIS — M1990 Unspecified osteoarthritis, unspecified site: Secondary | ICD-10-CM | POA: Diagnosis present

## 2014-07-02 DIAGNOSIS — D638 Anemia in other chronic diseases classified elsewhere: Secondary | ICD-10-CM | POA: Diagnosis present

## 2014-07-02 DIAGNOSIS — E43 Unspecified severe protein-calorie malnutrition: Secondary | ICD-10-CM | POA: Diagnosis present

## 2014-07-02 DIAGNOSIS — K589 Irritable bowel syndrome without diarrhea: Secondary | ICD-10-CM | POA: Diagnosis present

## 2014-07-02 DIAGNOSIS — R410 Disorientation, unspecified: Secondary | ICD-10-CM | POA: Diagnosis present

## 2014-07-02 DIAGNOSIS — C257 Malignant neoplasm of other parts of pancreas: Secondary | ICD-10-CM

## 2014-07-02 DIAGNOSIS — I5022 Chronic systolic (congestive) heart failure: Secondary | ICD-10-CM | POA: Diagnosis present

## 2014-07-02 DIAGNOSIS — Z8249 Family history of ischemic heart disease and other diseases of the circulatory system: Secondary | ICD-10-CM | POA: Diagnosis not present

## 2014-07-02 DIAGNOSIS — Z87891 Personal history of nicotine dependence: Secondary | ICD-10-CM | POA: Diagnosis not present

## 2014-07-02 DIAGNOSIS — I10 Essential (primary) hypertension: Secondary | ICD-10-CM | POA: Diagnosis present

## 2014-07-02 DIAGNOSIS — Z6824 Body mass index (BMI) 24.0-24.9, adult: Secondary | ICD-10-CM | POA: Diagnosis not present

## 2014-07-02 DIAGNOSIS — G9340 Encephalopathy, unspecified: Secondary | ICD-10-CM | POA: Diagnosis present

## 2014-07-02 DIAGNOSIS — K219 Gastro-esophageal reflux disease without esophagitis: Secondary | ICD-10-CM | POA: Diagnosis present

## 2014-07-02 DIAGNOSIS — R5081 Fever presenting with conditions classified elsewhere: Secondary | ICD-10-CM

## 2014-07-02 DIAGNOSIS — Z86010 Personal history of colonic polyps: Secondary | ICD-10-CM | POA: Diagnosis not present

## 2014-07-02 DIAGNOSIS — I2510 Atherosclerotic heart disease of native coronary artery without angina pectoris: Secondary | ICD-10-CM | POA: Diagnosis present

## 2014-07-02 DIAGNOSIS — I82409 Acute embolism and thrombosis of unspecified deep veins of unspecified lower extremity: Secondary | ICD-10-CM | POA: Insufficient documentation

## 2014-07-02 HISTORY — DX: Encephalopathy, unspecified: G93.40

## 2014-07-02 LAB — CBC WITH DIFFERENTIAL/PLATELET
Basophils Absolute: 0 10*3/uL (ref 0.0–0.1)
Basophils Relative: 0 % (ref 0–1)
Eosinophils Absolute: 0 10*3/uL (ref 0.0–0.7)
Eosinophils Relative: 0 % (ref 0–5)
HCT: 31 % — ABNORMAL LOW (ref 39.0–52.0)
Hemoglobin: 10.2 g/dL — ABNORMAL LOW (ref 13.0–17.0)
Lymphocytes Relative: 10 % — ABNORMAL LOW (ref 12–46)
Lymphs Abs: 0.5 10*3/uL — ABNORMAL LOW (ref 0.7–4.0)
MCH: 32.6 pg (ref 26.0–34.0)
MCHC: 32.9 g/dL (ref 30.0–36.0)
MCV: 99 fL (ref 78.0–100.0)
Monocytes Absolute: 0.3 10*3/uL (ref 0.1–1.0)
Monocytes Relative: 7 % (ref 3–12)
Neutro Abs: 4 10*3/uL (ref 1.7–7.7)
Neutrophils Relative %: 83 % — ABNORMAL HIGH (ref 43–77)
Platelets: 154 10*3/uL (ref 150–400)
RBC: 3.13 MIL/uL — ABNORMAL LOW (ref 4.22–5.81)
RDW: 17.7 % — ABNORMAL HIGH (ref 11.5–15.5)
WBC: 4.9 10*3/uL (ref 4.0–10.5)

## 2014-07-02 LAB — COMPREHENSIVE METABOLIC PANEL
ALK PHOS: 413 U/L — AB (ref 39–117)
ALT: 138 U/L — AB (ref 0–53)
AST: 187 U/L — ABNORMAL HIGH (ref 0–37)
Albumin: 3.2 g/dL — ABNORMAL LOW (ref 3.5–5.2)
Anion gap: 14 (ref 5–15)
BUN: 23 mg/dL (ref 6–23)
CALCIUM: 9.4 mg/dL (ref 8.4–10.5)
CO2: 23 meq/L (ref 19–32)
Chloride: 97 mEq/L (ref 96–112)
Creatinine, Ser: 0.9 mg/dL (ref 0.50–1.35)
GFR, EST AFRICAN AMERICAN: 85 mL/min — AB (ref >90)
GFR, EST NON AFRICAN AMERICAN: 73 mL/min — AB (ref >90)
GLUCOSE: 140 mg/dL — AB (ref 70–99)
POTASSIUM: 4.7 meq/L (ref 3.7–5.3)
Sodium: 134 mEq/L — ABNORMAL LOW (ref 137–147)
Total Bilirubin: 0.7 mg/dL (ref 0.3–1.2)
Total Protein: 7 g/dL (ref 6.0–8.3)

## 2014-07-02 LAB — TROPONIN I

## 2014-07-02 LAB — URINALYSIS, ROUTINE W REFLEX MICROSCOPIC
Bilirubin Urine: NEGATIVE
Glucose, UA: NEGATIVE mg/dL
Ketones, ur: NEGATIVE mg/dL
Leukocytes, UA: NEGATIVE
Nitrite: NEGATIVE
Protein, ur: NEGATIVE mg/dL
Specific Gravity, Urine: 1.02 (ref 1.005–1.030)
Urobilinogen, UA: 1 mg/dL (ref 0.0–1.0)
pH: 5 (ref 5.0–8.0)

## 2014-07-02 LAB — URINE MICROSCOPIC-ADD ON

## 2014-07-02 LAB — LIPASE, BLOOD: Lipase: 7 U/L — ABNORMAL LOW (ref 11–59)

## 2014-07-02 LAB — I-STAT CG4 LACTIC ACID, ED: Lactic Acid, Venous: 1.66 mmol/L (ref 0.5–2.2)

## 2014-07-02 LAB — MRSA PCR SCREENING: MRSA by PCR: NEGATIVE

## 2014-07-02 MED ORDER — ONDANSETRON HCL 4 MG PO TABS: 4.0000 mg | ORAL_TABLET | Freq: Four times a day (QID) | ORAL | Status: DC | PRN

## 2014-07-02 MED ORDER — HYDROMORPHONE HCL 1 MG/ML IJ SOLN: 1.0000 mg | INTRAMUSCULAR | Status: DC | PRN

## 2014-07-02 MED ORDER — PIPERACILLIN-TAZOBACTAM 3.375 G IVPB 30 MIN
3.3750 g | INTRAVENOUS | Status: AC
  Administered 2014-07-02: 3.375 g via INTRAVENOUS
  Filled 2014-07-02: qty 50

## 2014-07-02 MED ORDER — ACETAMINOPHEN 325 MG PO TABS
650.0000 mg | ORAL_TABLET | Freq: Once | ORAL | Status: AC
  Administered 2014-07-02: 650 mg via ORAL
  Filled 2014-07-02: qty 2

## 2014-07-02 MED ORDER — ALUM & MAG HYDROXIDE-SIMETH 200-200-20 MG/5ML PO SUSP: 30.0000 mL | Freq: Four times a day (QID) | ORAL | Status: DC | PRN

## 2014-07-02 MED ORDER — POLYETHYLENE GLYCOL 3350 17 G PO PACK
17.0000 g | PACK | Freq: Two times a day (BID) | ORAL | Status: DC | PRN
  Filled 2014-07-02: qty 1

## 2014-07-02 MED ORDER — DOCUSATE SODIUM 100 MG PO CAPS
100.0000 mg | ORAL_CAPSULE | Freq: Two times a day (BID) | ORAL | Status: DC
  Administered 2014-07-02 – 2014-07-06 (×8): 100 mg via ORAL
  Filled 2014-07-02 (×9): qty 1

## 2014-07-02 MED ORDER — HYDROCODONE-ACETAMINOPHEN 10-325 MG PO TABS
1.0000 | ORAL_TABLET | Freq: Four times a day (QID) | ORAL | Status: DC | PRN
  Administered 2014-07-02 – 2014-07-03 (×4): 1 via ORAL
  Administered 2014-07-04: 2 via ORAL
  Administered 2014-07-04 – 2014-07-05 (×3): 1 via ORAL
  Administered 2014-07-05 (×2): 2 via ORAL
  Administered 2014-07-06: 1 via ORAL
  Filled 2014-07-02 (×6): qty 1
  Filled 2014-07-02: qty 2
  Filled 2014-07-02: qty 1
  Filled 2014-07-02 (×3): qty 2

## 2014-07-02 MED ORDER — SODIUM CHLORIDE 0.9 % IV BOLUS (SEPSIS)
500.0000 mL | Freq: Once | INTRAVENOUS | Status: AC
  Administered 2014-07-02: 500 mL via INTRAVENOUS

## 2014-07-02 MED ORDER — PROCHLORPERAZINE MALEATE 5 MG PO TABS
5.0000 mg | ORAL_TABLET | Freq: Four times a day (QID) | ORAL | Status: DC | PRN
  Filled 2014-07-02: qty 1

## 2014-07-02 MED ORDER — SODIUM CHLORIDE 0.9 % IJ SOLN
3.0000 mL | Freq: Two times a day (BID) | INTRAMUSCULAR | Status: DC
  Administered 2014-07-03 – 2014-07-06 (×2): 3 mL via INTRAVENOUS

## 2014-07-02 MED ORDER — LORAZEPAM 1 MG PO TABS
1.0000 mg | ORAL_TABLET | Freq: Four times a day (QID) | ORAL | Status: DC | PRN
Start: 2014-07-02 — End: 2014-07-06

## 2014-07-02 MED ORDER — SODIUM CHLORIDE 0.9 % IJ SOLN
3.0000 mL | Freq: Two times a day (BID) | INTRAMUSCULAR | Status: DC
  Administered 2014-07-04 – 2014-07-06 (×2): 3 mL via INTRAVENOUS

## 2014-07-02 MED ORDER — ONDANSETRON HCL 4 MG/2ML IJ SOLN: 4.0000 mg | Freq: Four times a day (QID) | INTRAMUSCULAR | Status: DC | PRN

## 2014-07-02 MED ORDER — SODIUM CHLORIDE 0.9 % IV SOLN: 250.0000 mL | INTRAVENOUS | Status: DC | PRN

## 2014-07-02 MED ORDER — SODIUM CHLORIDE 0.9 % IJ SOLN: 3.0000 mL | INTRAMUSCULAR | Status: DC | PRN

## 2014-07-02 MED ORDER — TEMAZEPAM 15 MG PO CAPS
15.0000 mg | ORAL_CAPSULE | Freq: Every day | ORAL | Status: DC
  Administered 2014-07-02 – 2014-07-05 (×4): 15 mg via ORAL
  Filled 2014-07-02 (×4): qty 1

## 2014-07-02 MED ORDER — CAPECITABINE 500 MG PO TABS: 1500.0000 mg | ORAL_TABLET | Freq: Two times a day (BID) | ORAL | Status: DC

## 2014-07-02 MED ORDER — VANCOMYCIN HCL IN DEXTROSE 750-5 MG/150ML-% IV SOLN
750.0000 mg | Freq: Two times a day (BID) | INTRAVENOUS | Status: DC
  Administered 2014-07-03 – 2014-07-05 (×5): 750 mg via INTRAVENOUS
  Filled 2014-07-02 (×6): qty 150

## 2014-07-02 MED ORDER — PREGABALIN 75 MG PO CAPS
75.0000 mg | ORAL_CAPSULE | Freq: Every morning | ORAL | Status: DC
  Administered 2014-07-03 – 2014-07-06 (×4): 75 mg via ORAL
  Filled 2014-07-02 (×4): qty 1

## 2014-07-02 MED ORDER — BOOST PO LIQD
237.0000 mL | Freq: Every day | ORAL | Status: DC
  Administered 2014-07-03 – 2014-07-05 (×3): 237 mL via ORAL
  Filled 2014-07-02 (×4): qty 237

## 2014-07-02 MED ORDER — PRAVASTATIN SODIUM 40 MG PO TABS
40.0000 mg | ORAL_TABLET | Freq: Every evening | ORAL | Status: DC
  Administered 2014-07-02 – 2014-07-05 (×4): 40 mg via ORAL
  Filled 2014-07-02 (×6): qty 1

## 2014-07-02 MED ORDER — CARVEDILOL 12.5 MG PO TABS
12.5000 mg | ORAL_TABLET | Freq: Two times a day (BID) | ORAL | Status: DC
  Administered 2014-07-03 – 2014-07-06 (×6): 12.5 mg via ORAL
  Filled 2014-07-02 (×6): qty 1

## 2014-07-02 MED ORDER — PIPERACILLIN-TAZOBACTAM 3.375 G IVPB
3.3750 g | Freq: Three times a day (TID) | INTRAVENOUS | Status: DC
  Administered 2014-07-03 – 2014-07-05 (×8): 3.375 g via INTRAVENOUS
  Filled 2014-07-02 (×9): qty 50

## 2014-07-02 MED ORDER — NITROGLYCERIN 0.4 MG SL SUBL: 0.4000 mg | SUBLINGUAL_TABLET | SUBLINGUAL | Status: DC | PRN

## 2014-07-02 MED ORDER — BISACODYL 10 MG RE SUPP: 10.0000 mg | Freq: Every day | RECTAL | Status: DC | PRN

## 2014-07-02 MED ORDER — APIXABAN 2.5 MG PO TABS
2.5000 mg | ORAL_TABLET | Freq: Two times a day (BID) | ORAL | Status: DC
  Administered 2014-07-02 – 2014-07-06 (×8): 2.5 mg via ORAL
  Filled 2014-07-02 (×9): qty 1

## 2014-07-02 MED ORDER — VANCOMYCIN HCL IN DEXTROSE 1-5 GM/200ML-% IV SOLN
1000.0000 mg | INTRAVENOUS | Status: AC
  Administered 2014-07-02: 1000 mg via INTRAVENOUS
  Filled 2014-07-02: qty 200

## 2014-07-02 NOTE — H&P (Signed)
PCP:   Drema Pry, DO   Chief Complaint:  Fever, confusion  HPI: 80 yo male h/o stage 4 pancreatic cancer comes in with high fever today up to 104 at snf.  Pt denies any cough, no n/v/d.  No pain different than what he has associated with his cancer.  No sob.  No swelling.  pts hcpoa is present and says he turns the heat up in his room to 86 and its always hot in there.  He is still getting chemo, and he has developed another lesion on his liver in the last couple of weeks.  His confusion is better now and appears well.  Says he does have some dysuria the past couple of days.  Review of Systems:  Positive and negative as per HPI otherwise all other systems are negative  Past Medical History: Past Medical History  Diagnosis Date  . Diverticulosis of colon (without mention of hemorrhage)   . Irritable bowel syndrome   . Personal history of malignant neoplasm of prostate     s/p  radioactive seed implants 2000  . Unspecified hereditary and idiopathic peripheral neuropathy   . Nonischemic cardiomyopathy cardiologist-  dr Aundra Dubin    EF 25%  per last echo 2012 and cardiologist note--  2007-  ef  40-45%;  2008- ef 35-40%;  2010- ef 30-35%;  2012- ef 25%  . Sinus bradycardia   . Hypertension   . Arthritis     hands  . Hyperlipidemia   . History of DVT of lower extremity     2002  ,   left leg  . History of adenomatous polyp of colon     2001  . History of basal cell carcinoma excision   . Anticoagulated on Coumadin   . Type 2 diabetes, diet controlled   . GERD (gastroesophageal reflux disease)   . LBBB (left bundle branch block)   . Wears glasses   . Wears hearing aid     BILATERAL  . Unsteady gait   . DDD (degenerative disc disease)     cervical and lumbar  . DJD (degenerative joint disease)     hips  . Lower urinary tract symptoms (LUTS)   . 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  Coronary artery disease     per cath 2002  non-obstructive cad  . Chronic systolic heart failure   . H/O fracture of nose     12-30-2013-  pt fell at home open nose bone fx   Past Surgical History  Procedure Laterality Date  . Knee arthroscopy Left 1991  . Ercp with sphincterotomy and stone removal  05/ 1993  &  05-11-2006  . Total knee arthroplasty  10/05/2011    Procedure: TOTAL KNEE ARTHROPLASTY;  Surgeon: Gearlean Alf, MD;  Location: [facility];  Service: Orthopedics;  Laterality: Right;  . Transurethral resection of bladder tumor N/A 09/30/2012    Procedure: TRANSURETHRAL RESECTION OF BLADDER TUMOR (TURBT);  Surgeon: Franchot Gallo, MD;  Location: [facility];  Service: Urology;  Laterality: N/A;  WITH MITOMYCIN INSTILLATION   . Eus N/A 01/04/2014    Procedure: ESOPHAGEAL ENDOSCOPIC ULTRASOUND (EUS) RADIAL;  Surgeon: [name], MD;  Location: [facility];  Service: Endoscopy;  Laterality: N/A;  . Radioactive prostate seed implants  01/ 2000  . Mohs surgery  03/ 2009    LEFT EAR BASAL CELL CARCINOMA  . Tonsillectomy  AS CHILD  . Cholecystectomy open  1985  . Appendectomy  child  . Inguinal hernia repair Bilateral 1998    recurrent RIH  w/ repair 06-21-2006  . Total knee arthroplasty Left 02-21-2007    AND 04-21-2007  CLOSED MANIPULATION  . Cataract extraction w/ intraocular lens implant Right 2004  . Lumbar spine surgery  X2  . Cardiac catheterization  06-14-2001  dr Darnell Level brodie    non-obstructive CAD/  LM 30%,  pLAD 50%,  mLAD 30%,  RCA  &  CFX  irregularites ,  global hypokinesis,  ef 40%  . Cardiovascular stress test  02-07-2007   dr Darnell Level brodie    negative for ischemia,  low risk myoview perfusion study  . Transthoracic echocardiogram  01-28-2011    moderate LVH/  ef 25%/  diffuse hypokinesis/  mild LAE/  mild TR/  trivial AR & MR  . Cystoscopy w/ retrogrades Bilateral 03/09/2014    Procedure: CYSTOSCOPY WITH BILATERAL RETROGRADE PYELOGRAM BLADDER BX AND RENAL WASHINGS.;   Surgeon: Jorja Loa, MD;  Location[facility];  Service: Urology;  Laterality: Bilateral;  . Eus N/A 04/05/2014    Procedure: UPPER ENDOSCOPIC ULTRASOUND (EUS) LINEAR;  Surgeon: [name], MD;  Location: [facility];  Service: Endoscopy;  Laterality: N/A;  radial linear  . Endoscopic retrograde cholangiopancreatography (ercp) with propofol N/A 04/12/2014    Procedure: ENDOSCOPIC RETROGRADE CHOLANGIOPANCREATOGRAPHY (ERCP) WITH PROPOFOL;  Surgeon: [name], MD;  Location: [facility];  Service: Endoscopy;  Laterality: N/A;  metal stent  . Biliary stent placement N/A 04/12/2014    Procedure: BILIARY STENT PLACEMENT;  Surgeon: [name], MD;  Location: [facility];  Service: Endoscopy;  Laterality: N/A;    Medications: Prior to Admission medications   Medication Sig Start Date End Date Taking? Authorizing Provider  apixaban (ELIQUIS) 2.5 MG TABS tablet Take 2.5 mg by mouth 2 (two) times daily.   Yes Historical Provider, MD  bisacodyl (DULCOLAX) 10 MG suppository Place 1 suppository (10 mg total) rectally daily as needed for moderate constipation. 04/03/14  Yes Christina P Rama, MD  capecitabine (XELODA) 500 MG tablet Take 1,500 mg by mouth 2 (two) times daily after a meal. TAKES 3 TABS (1500) MG BID  ///  ON 14 DAYS OFF 7 DAYS FOR 3 CYCLES.   STARTED 06/06/14   Yes Historical Provider, MD  carvedilol (COREG) 12.5 MG tablet Take 12.5 mg by mouth 2 (two) times daily with a meal.   Yes Historical Provider, MD  docusate sodium 100 MG CAPS Take 100 mg by mouth 2 (two) times daily. 04/03/14  Yes Venetia Maxon Rama, MD  HYDROcodone-acetaminophen ([place]) 10-325 MG per tablet Take one tablet by mouth every 4 hours as needed for pain 04/23/14  Yes Tiffany L Reed, DO  insulin glargine (LANTUS) 100 UNIT/ML injection Inject 3 Units into the skin at bedtime.   Yes Historical Provider, MD  lactose free nutrition (BOOST) LIQD Take 237 mLs by mouth daily at 3 pm.   Yes  Historical Provider, MD  lidocaine-prilocaine (EMLA) cream Apply 1 application topically as needed. Patient taking differently: Apply 1 application topically as needed (port access).  04/16/14  Yes Volanda Napoleon, MD  LORazepam (ATIVAN) 0.5 MG tablet Take 1 mg by mouth every 6 (six) hours as needed (nausea/vomiting).    Yes Historical Provider, MD  Omega-3 Fatty Acids (FISH OIL) 1000 MG CAPS Take 1,000 mg by mouth every morning.    Yes Historical Provider, MD  ondansetron (ZOFRAN) 8 MG tablet Take by mouth 2 (two) times daily as needed for nausea or vomiting.    Yes Historical Provider, MD  polyethylene glycol (MIRALAX / GLYCOLAX) packet Take 17 g by mouth 2 (two) times daily as needed for mild constipation.   Yes Historical Provider, MD  pravastatin (PRAVACHOL) 40 MG tablet Take 40 mg by mouth every evening.    Yes Historical Provider, MD  pregabalin (LYRICA) 75 MG capsule Take 75 mg by mouth every morning.    Yes Historical Provider, MD  prochlorperazine (COMPAZINE) 5 MG tablet Take 5 mg by mouth every 6 (six) hours as needed for nausea or vomiting.   Yes Historical Provider, MD  ranitidine (ZANTAC) 150 MG tablet Take 150 mg by mouth 2 (two) times daily.   Yes Historical Provider, MD  temazepam (RESTORIL) 15 MG capsule Take 15 mg by mouth at bedtime.    Yes Historical Provider, MD  nitroGLYCERIN (NITROSTAT) 0.4 MG SL tablet Place 1 tablet (0.4 mg total) under the tongue every 5 (five) minutes as needed for chest pain. 11/22/13   Coralyn Mark, MD  Nutritional Supplements (GLUCERNA ADVANCE SHAKE) LIQD Take by mouth every morning.    Historical Provider, MD    Allergies:  No Known Allergies  Social History:  reports that he quit smoking about 31 years ago. His smoking use included Cigarettes. He started smoking about 60 years ago. He has a 10 pack-year smoking history. He has never used smokeless tobacco. He reports that he does not drink alcohol or use illicit drugs.  Family History: Family  History  Problem Relation Age of Onset  . Throat cancer Brother   . Coronary artery disease Father     deceased    Physical Exam: Filed Vitals:   07/02/14 1635 07/02/14 1639 07/02/14 1828  BP:   100/51  Pulse:   63  Temp:   99.2 F (37.3 C)  TempSrc:   Oral  Resp:   15  SpO2: 99% 99% 94%   General appearance: alert, cooperative and no distress Head: Normocephalic, without obvious abnormality, atraumatic Eyes: negative Nose: Nares normal. Septum midline. Mucosa normal. No drainage or sinus tenderness. Neck: no JVD and supple, symmetrical, trachea midline Lungs: clear to auscultation bilaterally Heart: regular rate and rhythm, S1, S2 normal, no murmur, click, rub or gallop Abdomen: soft, non-tender; bowel sounds normal; no masses,  no organomegaly Extremities: extremities normal, atraumatic, no cyanosis or edema Pulses: 2+ and symmetric Skin: Skin color, texture, turgor normal. No rashes or lesions Neurologic: Grossly normal    Labs on Admission:   Recent Labs  07/02/14 1643  NA 134*  K 4.7  CL 97  CO2 23  GLUCOSE 140*  BUN 23  CREATININE 0.90  CALCIUM 9.4    Recent Labs  07/02/14 1643  AST 187*  ALT 138*  ALKPHOS 413*  BILITOT 0.7  PROT 7.0  ALBUMIN 3.2*    Recent Labs  07/02/14 1643  WBC 4.9  NEUTROABS 4.0  HGB 10.2*  HCT 31.0*  MCV 99.0  PLT 154    Radiological Exams on Admission: Dg Chest 2 View  07/02/2014   CLINICAL DATA:  Fever and altered level of consciousness.  EXAM: CHEST  2 VIEW  COMPARISON:  PA and lateral chest 03/30/2014 03/01/2014 and 09/22/2012.  FINDINGS: Port-A-Cath is in place. Mild streaky opacity in the left lung base is most consistent with atelectasis or scar. No consolidative process, pneumothorax or effusion is identified. Heart size is upper  normal.  IMPRESSION: No acute abnormality.   Electronically Signed   By: Inge Rise M.D.   On: 07/02/2014 18:20   Assessment/Plan  80 yo male chemo pt for stage 4  pancreatic cancer with fever and delerium  Principal Problem:   Acute delirium-  Due to high fever.  Source unclear.  Quick flu pending.  Likely viral but will cover empirically with vanc/zosyn.  Blood cx pending.  Along with uc.  No focal neuro deficits.  Active Problems:   Coronary atherosclerosis   SYSTOLIC HEART FAILURE, CHRONIC last EF about 25% - well compensated at this time   Liver metastasis   Pancreatic cancer   Fever presenting with conditions classified elsewhere  Admit to tele bed.  Pt wishes to be DNI, no intubation but wishes cpr/shock if necessary.  Agrees to all other treatments, is still hopeful that his chemotherapy will work.  Pace Lamadrid A 07/02/2014, 6:45 PM

## 2014-07-02 NOTE — ED Notes (Signed)
Bed: WA07 Expected date:  Expected time:  Means of arrival:  Comments: ems 

## 2014-07-02 NOTE — Progress Notes (Signed)
ANTIBIOTIC CONSULT NOTE - INITIAL  Pharmacy Consult for Vancomycin and Zosyn Indication: rule out sepsis  No Known Allergies  Patient Measurements:   Height: 5'9" Weight: 76.7kg  Vital Signs:   Intake/Output from previous day:   Intake/Output from this shift:    Labs:  Recent Labs  07/02/14 1643  WBC 4.9  HGB 10.2*  PLT 154  CREATININE 0.90   Estimated Creatinine Clearance: 55.6 mL/min (by C-G formula based on Cr of 0.9). No results for input(s): VANCOTROUGH, VANCOPEAK, VANCORANDOM, GENTTROUGH, GENTPEAK, GENTRANDOM, TOBRATROUGH, TOBRAPEAK, TOBRARND, AMIKACINPEAK, AMIKACINTROU, AMIKACIN in the last 72 hours.   Microbiology: No results found for this or any previous visit (from the past 720 hour(s)).  Medical History: Past Medical History  Diagnosis Date  . Diverticulosis of colon (without mention of hemorrhage)   . Irritable bowel syndrome   . Personal history of malignant neoplasm of prostate     s/p  radioactive seed implants 2000  . Unspecified hereditary and idiopathic peripheral neuropathy   . Nonischemic cardiomyopathy cardiologist-  dr Aundra Dubin    EF 25%  per last echo 2012 and cardiologist note--  2007-  ef  40-45%;  2008- ef 35-40%;  2010- ef 30-35%;  2012- ef 25%  . Sinus bradycardia   . Hypertension   . Arthritis     hands  . Hyperlipidemia   . History of DVT of lower extremity     2002  ,   left leg  . History of adenomatous polyp of colon     2001  . History of basal cell carcinoma excision   . Anticoagulated on Coumadin   . Type 2 diabetes, diet controlled   . GERD (gastroesophageal reflux disease)   . LBBB (left bundle branch block)   . Wears glasses   . Wears hearing aid     BILATERAL  . Unsteady gait   . DDD (degenerative disc disease)     cervical and lumbar  . DJD (degenerative joint disease)     hips  . Lower urinary tract symptoms (LUTS)   . Bladder cancer dx  march 2014  papillary urothelial carcinoma    s/p turbt with chemo  instillation (urologist--  dr Diona Fanti)  . Coronary artery disease     per cath 2002  non-obstructive cad  . Chronic systolic heart failure   . H/O fracture of nose     12-30-2013-  pt fell at home open nose bone fx    Medications:  Scheduled:  . [START ON 07/03/2014] vancomycin  750 mg Intravenous Q12H   Infusions:  . piperacillin-tazobactam    . [START ON 07/03/2014] piperacillin-tazobactam (ZOSYN)  IV    . vancomycin     PRN:   Assessment: 80 year old male with pancreatic cancer and liver mets who presents today with a fever and shivering from his SNF. Pharmacy is consulted to dose vancomycin and zosyn for presumed sepsis.  12/14 >> Vancomycin >> 12/14 >> Zosyn >>  Tmax: 104 (per staff) WBC: 4.9k Renal: SCr 0.9 (at baseline), CrCl 55 CG and N Lactate: 1.66  12/14 blood: 12/14 urine:  Goal of Therapy:  Vancomycin trough level 15-20 mcg/ml  Zosyn dose per indication, renal function  Plan:   Vancomycin 1g IV x 1, then 750mg  IV q12h Check trough at steady state Zosyn 3.375gm IV q8h (4hr extended infusions) Follow up renal function & cultures, clinical course  Peggyann Juba, PharmD, BCPS Pager: [phone] 07/02/2014,5:32 PM

## 2014-07-02 NOTE — ED Provider Notes (Signed)
CSN: [id number]     Arrival date & time 07/02/14  1632 History   First MD Initiated Contact with Patient 07/02/14 1638     Chief Complaint  Patient presents with  . Fever     HPI  Mr. Hillmer is an 80 year old male with pancreatic cancer and liver mets who presents today with a fever from his SNF. He is incoherent at the time of interview, so information was collected from [employer] from the facility. He reports the patient was in his normal state of health today until about 2pm when he noticed the patient was shivering; his temperature was 101.7 after which he was given home Norco 5/325. Some time later, he had T103.1 and was then given Tylenol 325 x 2. They spoke with Dr. Marin Olp, his oncologist, by phone who recommended he come in to the ED for evaluation. He has had UTIs in the past though does not use a catheter. At the time of interview, Mr. Presley reports having some cough, runny nose though denies any vomiting, nausea, diarrhea.    Past Medical History  Diagnosis Date  . Diverticulosis of colon (without mention of hemorrhage)   . Irritable bowel syndrome   . Personal history of malignant neoplasm of prostate     s/p  radioactive seed implants 2000  . Unspecified hereditary and idiopathic peripheral neuropathy   . Nonischemic cardiomyopathy cardiologist-  dr Aundra Dubin    EF 25%  per last echo 2012 and cardiologist note--  2007-  ef  40-45%;  2008- ef 35-40%;  2010- ef 30-35%;  2012- ef 25%  . Sinus bradycardia   . Hypertension   . Arthritis     hands  . Hyperlipidemia   . History of DVT of lower extremity     2002  ,   left leg  . History of adenomatous polyp of colon     2001  . History of basal cell carcinoma excision   . Anticoagulated on Coumadin   . Type 2 diabetes, diet controlled   . GERD (gastroesophageal reflux disease)   . LBBB (left bundle branch block)   . Wears glasses   . Wears hearing aid     BILATERAL  . Unsteady gait   . DDD (degenerative disc disease)      cervical and lumbar  . DJD (degenerative joint disease)     hips  . Lower urinary tract symptoms (LUTS)   . 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 Coronary artery disease     per cath 2002  non-obstructive cad  . Chronic systolic heart failure   . H/O fracture of nose     12-30-2013-  pt fell at home open nose bone fx   Past Surgical History  Procedure Laterality Date  . Knee arthroscopy Left 1991  . Ercp with sphincterotomy and stone removal  05/ 1993  &  05-11-2006  . Total knee arthroplasty  10/05/2011    Procedure: TOTAL KNEE ARTHROPLASTY;  Surgeon: Gearlean Alf, MD;  Location: [facility];  Service: Orthopedics;  Laterality: Right;  . Transurethral resection of bladder tumor N/A 09/30/2012    Procedure: TRANSURETHRAL RESECTION OF BLADDER TUMOR (TURBT);  Surgeon: Franchot Gallo, MD;  Location: [facility];  Service: Urology;  Laterality: N/A;  WITH MITOMYCIN INSTILLATION   . Eus N/A 01/04/2014    Procedure: ESOPHAGEAL ENDOSCOPIC ULTRASOUND (EUS) RADIAL;  Surgeon:  [name], MD;  Location: [facility];  Service: Endoscopy;  Laterality: N/A;  . Radioactive prostate seed implants  01/ 2000  . Mohs surgery  03/ 2009    LEFT EAR BASAL CELL CARCINOMA  . Tonsillectomy  AS CHILD  . Cholecystectomy open  1985  . Appendectomy  child  . Inguinal hernia repair Bilateral 1998    recurrent RIH  w/ repair 06-21-2006  . Total knee arthroplasty Left 02-21-2007    AND 04-21-2007  CLOSED MANIPULATION  . Cataract extraction w/ intraocular lens implant Right 2004  . Lumbar spine surgery  X2  . Cardiac catheterization  06-14-2001  dr Darnell Level brodie    non-obstructive CAD/  LM 30%,  pLAD 50%,  mLAD 30%,  RCA  &  CFX  irregularites ,  global hypokinesis,  ef 40%  . Cardiovascular stress test  02-07-2007   dr Darnell Level brodie    negative for ischemia,  low risk myoview perfusion study  . Transthoracic  echocardiogram  01-28-2011    moderate LVH/  ef 25%/  diffuse hypokinesis/  mild LAE/  mild TR/  trivial AR & MR  . Cystoscopy w/ retrogrades Bilateral 03/09/2014    Procedure: CYSTOSCOPY WITH BILATERAL RETROGRADE PYELOGRAM BLADDER BX AND RENAL WASHINGS.;  Surgeon: Jorja Loa, MD;  Location: [facility];  Service: Urology;  Laterality: Bilateral;  . Eus N/A 04/05/2014    Procedure: UPPER ENDOSCOPIC ULTRASOUND (EUS) LINEAR;  Surgeon: [name], MD;  Location: [facility];  Service: Endoscopy;  Laterality: N/A;  radial linear  . Endoscopic retrograde cholangiopancreatography (ercp) with propofol N/A 04/12/2014    Procedure: ENDOSCOPIC RETROGRADE CHOLANGIOPANCREATOGRAPHY (ERCP) WITH PROPOFOL;  Surgeon: [name], MD;  Location: [facility];  Service: Endoscopy;  Laterality: N/A;  metal stent  . Biliary stent placement N/A 04/12/2014    Procedure: BILIARY STENT PLACEMENT;  Surgeon: [name], MD;  Location: [facility];  Service: Endoscopy;  Laterality: N/A;   Family History  Problem Relation Age of Onset  . Throat cancer Brother   . Coronary artery disease Father     deceased   History  Substance Use Topics  . Smoking status: Former Smoker -- 1.00 packs/day for 10 years    Types: Cigarettes    Start date: 04/10/1954    Quit date: 09/10/1982  . Smokeless tobacco: Never Used     Comment: quit 32 years ago  . Alcohol Use: No    Review of Systems  Constitutional: Positive for fever and chills.  HENT: Positive for rhinorrhea.   Respiratory: Positive for cough.   Gastrointestinal: Positive for abdominal pain. Negative for nausea, vomiting and diarrhea.       Baseline abdominal pain      Allergies  Review of patient's allergies indicates no known allergies.  Home Medications   Prior to Admission medications   Medication Sig Start Date End Date Taking? Authorizing Provider  apixaban (ELIQUIS) 2.5 MG TABS tablet Take 2.5 mg by mouth 2 (two)  times daily.   Yes Historical Provider, MD  bisacodyl (DULCOLAX) 10 MG suppository Place 1 suppository (10 mg total) rectally daily as needed for moderate constipation. 04/03/14  Yes Christina P Rama, MD  capecitabine (XELODA) 500 MG tablet Take 1,500 mg by mouth 2 (two) times daily after a meal. TAKES 3 TABS (1500) MG BID  ///  ON 14 DAYS OFF 7 DAYS FOR 3 CYCLES.   STARTED 06/06/14   Yes Historical Provider, MD  carvedilol (COREG) 12.5 MG  tablet Take 12.5 mg by mouth 2 (two) times daily with a meal.   Yes Historical Provider, MD  docusate sodium 100 MG CAPS Take 100 mg by mouth 2 (two) times daily. 04/03/14  Yes Venetia Maxon Rama, MD  HYDROcodone-acetaminophen ([place]) 10-325 MG per tablet Take one tablet by mouth every 4 hours as needed for pain 04/23/14  Yes Tiffany L Reed, DO  insulin glargine (LANTUS) 100 UNIT/ML injection Inject 3 Units into the skin at bedtime.   Yes Historical Provider, MD  lactose free nutrition (BOOST) LIQD Take 237 mLs by mouth daily at 3 pm.   Yes Historical Provider, MD  lidocaine-prilocaine (EMLA) cream Apply 1 application topically as needed. Patient taking differently: Apply 1 application topically as needed (port access).  04/16/14  Yes Volanda Napoleon, MD  LORazepam (ATIVAN) 0.5 MG tablet Take 1 mg by mouth every 6 (six) hours as needed (nausea/vomiting).    Yes Historical Provider, MD  Omega-3 Fatty Acids (FISH OIL) 1000 MG CAPS Take 1,000 mg by mouth every morning.    Yes Historical Provider, MD  ondansetron (ZOFRAN) 8 MG tablet Take by mouth 2 (two) times daily as needed for nausea or vomiting.    Yes Historical Provider, MD  polyethylene glycol (MIRALAX / GLYCOLAX) packet Take 17 g by mouth 2 (two) times daily as needed for mild constipation.   Yes Historical Provider, MD  pravastatin (PRAVACHOL) 40 MG tablet Take 40 mg by mouth every evening.    Yes Historical Provider, MD  pregabalin (LYRICA) 75 MG capsule Take 75 mg by mouth every morning.    Yes Historical  Provider, MD  prochlorperazine (COMPAZINE) 5 MG tablet Take 5 mg by mouth every 6 (six) hours as needed for nausea or vomiting.   Yes Historical Provider, MD  ranitidine (ZANTAC) 150 MG tablet Take 150 mg by mouth 2 (two) times daily.   Yes Historical Provider, MD  temazepam (RESTORIL) 15 MG capsule Take 15 mg by mouth at bedtime.    Yes Historical Provider, MD  nitroGLYCERIN (NITROSTAT) 0.4 MG SL tablet Place 1 tablet (0.4 mg total) under the tongue every 5 (five) minutes as needed for chest pain. 11/22/13   Coralyn Mark, MD  Nutritional Supplements (GLUCERNA ADVANCE SHAKE) LIQD Take by mouth every morning.    Historical Provider, MD   BP 100/51 mmHg  Pulse 63  Temp(Src) 99.2 F (37.3 C) (Oral)  Resp 15  SpO2 94% Physical Exam  Constitutional: He appears well-developed and well-nourished. No distress.  HENT:  Head: Normocephalic and atraumatic.  Mouth/Throat: Oropharynx is clear and moist. No oropharyngeal exudate.  Eyes: Conjunctivae are normal. Pupils are equal, round, and reactive to light.  Cardiovascular: Normal rate, regular rhythm and normal heart sounds.  Exam reveals no gallop and no friction rub.   No murmur heard. Pulmonary/Chest: Effort normal and breath sounds normal. No respiratory distress. He has no wheezes. He has no rales.  Abdominal: Soft. Bowel sounds are normal. He exhibits no distension. There is no tenderness. There is no rebound.  Neurological: He is alert.  Oriented to name Cyndie Mull), place [place]), but not year (December; 2012). Confusing females in room with males and vice versa.   Skin: He is not diaphoretic.    ED Course  Procedures (including critical care time) Labs Review Labs Reviewed  CBC WITH DIFFERENTIAL - Abnormal; Notable for the following:    RBC 3.13 (*)    Hemoglobin 10.2 (*)    HCT 31.0 (*)  RDW 17.7 (*)    Neutrophils Relative % 83 (*)    Lymphocytes Relative 10 (*)    Lymphs Abs 0.5 (*)    All other components within  normal limits  COMPREHENSIVE METABOLIC PANEL - Abnormal; Notable for the following:    Sodium 134 (*)    Glucose, Bld 140 (*)    Albumin 3.2 (*)    AST 187 (*)    ALT 138 (*)    Alkaline Phosphatase 413 (*)    GFR calc non Af Amer 73 (*)    GFR calc Af Amer 85 (*)    All other components within normal limits  URINALYSIS, ROUTINE W REFLEX MICROSCOPIC - Abnormal; Notable for the following:    Color, Urine AMBER (*)    Hgb urine dipstick MODERATE (*)    All other components within normal limits  CULTURE, BLOOD (ROUTINE X 2)  CULTURE, BLOOD (ROUTINE X 2)  URINE CULTURE  URINE MICROSCOPIC-ADD ON  INFLUENZA PANEL BY PCR (TYPE A & B, H1N1)  I-STAT CG4 LACTIC ACID, ED    Imaging Review Dg Chest 2 View  07/02/2014   CLINICAL DATA:  Fever and altered level of consciousness.  EXAM: CHEST  2 VIEW  COMPARISON:  PA and lateral chest 03/30/2014 03/01/2014 and 09/22/2012.  FINDINGS: Port-A-Cath is in place. Mild streaky opacity in the left lung base is most consistent with atelectasis or scar. No consolidative process, pneumothorax or effusion is identified. Heart size is upper normal.  IMPRESSION: No acute abnormality.   Electronically Signed   By: Inge Rise M.D.   On: 07/02/2014 18:20     EKG Interpretation   Date/Time:  Monday July 02 2014 16:42:09 EST Ventricular Rate:  78 PR Interval:  212 QRS Duration: 143 QT Interval:  422 QTC Calculation: 481 R Axis:   -27 Text Interpretation:  Normal sinus rhythm Borderline prolonged PR interval  Left bundle branch block no significant change since Sept 2015 Confirmed  by [place]  MD, Ferguson ([id number]) on 07/02/2014 5:01:52 PM      MDM   Final diagnoses:  Sepsis, due to unspecified organism    Given that he is actively taking chemo, opportunistic infection is suspect. Physical exam is without any remarkable findings. Plan to check cultures, UA, CXR, CBC, BMET. Will start empiric broad-spectrum abx therapy as well. Per RN, Chauncey Reading [name] is on her way, and he is full code.   633PM: AST/ALT elevated though have been in the past. UA without signs for UTI. Plan to admit for sepsis.    Charlott Rakes, MD 07/02/14 [id number]  Ephraim Hamburger, MD 07/03/14 [phone]

## 2014-07-02 NOTE — ED Notes (Signed)
Per EMS- Patient is a resident of [place]. Patient received chemo treatment this AM at the [facility]. Staff at the [facility] noted that the patient was shaking and c/o weakness. T, 104.0 orally.

## 2014-07-02 NOTE — Telephone Encounter (Signed)
John, RN called stating that patient had temp of 101 and despite administration of tylenol, it increased to 103. He is under blankets saying he is freezing. He's also showing signs of delirium talking to himself and making nonsensical  Statements. Dr Marin Olp notified and order to call an ambulance and transport to [facility] Emergency Room received. Gave order to [employer] and he will call ambulance ASAP.

## 2014-07-03 LAB — BASIC METABOLIC PANEL
Anion gap: 11 (ref 5–15)
BUN: 21 mg/dL (ref 6–23)
CHLORIDE: 101 meq/L (ref 96–112)
CO2: 25 mEq/L (ref 19–32)
CREATININE: 0.91 mg/dL (ref 0.50–1.35)
Calcium: 9.2 mg/dL (ref 8.4–10.5)
GFR calc Af Amer: 85 mL/min — ABNORMAL LOW (ref >90)
GFR calc non Af Amer: 73 mL/min — ABNORMAL LOW (ref >90)
GLUCOSE: 126 mg/dL — AB (ref 70–99)
POTASSIUM: 4.1 meq/L (ref 3.7–5.3)
Sodium: 137 mEq/L (ref 137–147)

## 2014-07-03 LAB — URINE CULTURE
COLONY COUNT: NO GROWTH
CULTURE: NO GROWTH

## 2014-07-03 LAB — CBC
HCT: 29.5 % — ABNORMAL LOW (ref 39.0–52.0)
HEMOGLOBIN: 9.6 g/dL — AB (ref 13.0–17.0)
MCH: 32.3 pg (ref 26.0–34.0)
MCHC: 32.5 g/dL (ref 30.0–36.0)
MCV: 99.3 fL (ref 78.0–100.0)
Platelets: 138 10*3/uL — ABNORMAL LOW (ref 150–400)
RBC: 2.97 MIL/uL — AB (ref 4.22–5.81)
RDW: 17.7 % — ABNORMAL HIGH (ref 11.5–15.5)
WBC: 4.2 10*3/uL (ref 4.0–10.5)

## 2014-07-03 LAB — INFLUENZA PANEL BY PCR (TYPE A & B)
H1N1 flu by pcr: NOT DETECTED
Influenza A By PCR: NEGATIVE
Influenza B By PCR: NEGATIVE

## 2014-07-03 MED ORDER — SODIUM CHLORIDE 0.9 % IV SOLN
INTRAVENOUS | Status: AC
  Administered 2014-07-03: 18:00:00 via INTRAVENOUS

## 2014-07-03 MED ORDER — GLUCERNA SHAKE PO LIQD
237.0000 mL | Freq: Two times a day (BID) | ORAL | Status: DC | PRN
  Filled 2014-07-03: qty 237

## 2014-07-03 NOTE — Progress Notes (Signed)
Patient Demographics  [name] [name], is a 80 y.o. male, DOB - Mar 21, 1923, XKG:[id number]  Admit date - 07/02/2014   Admitting Physician Phillips Grout, MD  Outpatient Primary MD for the patient is Drema Pry, DO  LOS - 1   Chief Complaint  Patient presents with  . Fever      Admission history of present illness/brief narrative: 80 yo male h/o stage 4 pancreatic cancer comes in with high fever today up to 104 at snf. Pt denies any cough, no n/v/d. No pain different than what he has associated with his cancer. No sob. No swelling. He is still getting chemo, and he has developed another lesion on his liver in the last couple of weeks. His confusion is better now and appears well. Has negative urine analysis, no infiltrate on chest x-ray. Subjective:   Cyndie Mull today has, No headache, No chest pain, No abdominal pain - No Nausea, No new weakness tingling or numbness, No Cough - SOB.   Assessment & Plan    Principal Problem:   Acute delirium Active Problems:   Coronary atherosclerosis   SYSTOLIC HEART FAILURE, CHRONIC   Liver metastasis   Pancreatic cancer   Fever presenting with conditions classified elsewhere  Acute delirium -This is most likely secondary to fever, source is unclear, flu still pending, has significant improvement of mental status.  Fever -Area so far septic workup is negative, patient is not cytopenic, possibly secondary to chemotherapy. -Continue with empiric IV vancomycin and Zosyn pending blood cultures.  Pancreatic cancer with liver metastasis: -Patient is following with oncology Dr. Marin Olp as an outpatient -Patient is in Xeloda which is currently on hold, will request Dr. Lutricia Feil opinion in a.m. regarding resumption of Xeloda while inpatient.  History of DVT -Continue with Eliquis  Code Status: Partial  Family  Communication: None at bedside  Disposition Plan: Remains inpatient   Procedures  None   Consults   None   Medications  Scheduled Meds: . apixaban  2.5 mg Oral BID  . carvedilol  12.5 mg Oral BID WC  . docusate sodium  100 mg Oral BID  . lactose free nutrition  237 mL Oral Q1500  . piperacillin-tazobactam (ZOSYN)  IV  3.375 g Intravenous Q8H  . pravastatin  40 mg Oral QPM  . pregabalin  75 mg Oral q morning - 10a  . sodium chloride  3 mL Intravenous Q12H  . sodium chloride  3 mL Intravenous Q12H  . temazepam  15 mg Oral QHS  . vancomycin  750 mg Intravenous Q12H   Continuous Infusions:  PRN Meds:.sodium chloride, alum & mag hydroxide-simeth, bisacodyl, feeding supplement (GLUCERNA SHAKE), HYDROcodone-acetaminophen, HYDROmorphone (DILAUDID) injection, LORazepam, nitroGLYCERIN, ondansetron **OR** ondansetron (ZOFRAN) IV, polyethylene glycol, prochlorperazine, sodium chloride  DVT Prophylaxis  patient is on Eliquis  Lab Results  Component Value Date   PLT 138* 07/03/2014    Antibiotics    Anti-infectives    Start     Dose/Rate Route Frequency Ordered Stop   07/03/14 0800  vancomycin (VANCOCIN) IVPB 750 mg/150 ml premix     750 mg150 mL/hr over 60 Minutes Intravenous Every 12 hours 07/02/14 1732     07/03/14 0200  piperacillin-tazobactam (ZOSYN) IVPB 3.375 g     3.375  g12.5 mL/hr over 240 Minutes Intravenous Every 8 hours 07/02/14 1732     07/02/14 1745  vancomycin (VANCOCIN) IVPB 1000 mg/200 mL premix     1,000 mg200 mL/hr over 60 Minutes Intravenous STAT 07/02/14 1731 07/02/14 1925   07/02/14 1745  piperacillin-tazobactam (ZOSYN) IVPB 3.375 g     3.375 g100 mL/hr over 30 Minutes Intravenous STAT 07/02/14 1731 07/02/14 1923          Objective:   Filed Vitals:   07/02/14 2005 07/02/14 2115 07/03/14 0523 07/03/14 1409  BP: 101/38  116/61 107/59  Pulse:   53 48  Temp: 98.3 F (36.8 C)  98.2 F (36.8 C) 98.3 F (36.8 C)  TempSrc: Oral  Oral Oral  Resp: 20   18 18   Height:  5\' 10"  (1.778 m)    Weight:  76.159 kg (167 lb 14.4 oz)    SpO2:   95% 96%    Wt Readings from Last 3 Encounters:  07/02/14 76.159 kg (167 lb 14.4 oz)  06/28/14 76.658 kg (169 lb)  06/20/14 75.751 kg (167 lb)     Intake/Output Summary (Last 24 hours) at 07/03/14 1703 Last data filed at 07/03/14 1300  Gross per 24 hour  Intake    480 ml  Output    375 ml  Net    105 ml     Physical Exam  Awake Alert, Oriented , No new F.N deficits, Normal affect [place].AT,PERRAL Supple Neck,No JVD, No cervical lymphadenopathy appriciated.  Symmetrical Chest wall movement, Good air movement bilaterally, CTAB RRR,No Gallops,Rubs or new Murmurs, No Parasternal Heave +ve B.Sounds, Abd Soft, No tenderness, No organomegaly appriciated, No rebound - guarding or rigidity. No Cyanosis, Clubbing or edema, No new Rash or bruise     Data Review   Micro Results Recent Results (from the past 240 hour(s))  Culture, blood (routine x 2)     Status: None (Preliminary result)   Collection Time: 07/02/14  4:43 PM  Result Value Ref Range Status   Specimen Description BLOOD BLOOD LEFT FOREARM  Final   Special Requests BOTTLES DRAWN AEROBIC AND ANAEROBIC 5ML  Final   Culture  Setup Time   Final    07/02/2014 20:43 Performed at [facility]    Culture   Final           BLOOD CULTURE RECEIVED NO GROWTH TO DATE CULTURE WILL BE HELD FOR 5 DAYS BEFORE ISSUING A FINAL NEGATIVE REPORT Performed at [facility]    Report Status PENDING  Incomplete  Culture, blood (routine x 2)     Status: None (Preliminary result)   Collection Time: 07/02/14  5:07 PM  Result Value Ref Range Status   Specimen Description BLOOD BLOOD RIGHT FOREARM  Final   Special Requests BOTTLES DRAWN AEROBIC AND ANAEROBIC 3ML  Final   Culture  Setup Time   Final    07/02/2014 20:43 Performed at [facility]    Culture   Final           BLOOD CULTURE RECEIVED NO GROWTH TO DATE CULTURE WILL BE HELD FOR  5 DAYS BEFORE ISSUING A FINAL NEGATIVE REPORT Performed at [facility]    Report Status PENDING  Incomplete  MRSA PCR Screening     Status: None   Collection Time: 07/02/14 10:04 PM  Result Value Ref Range Status   MRSA by PCR NEGATIVE NEGATIVE Final    Comment:        The GeneXpert MRSA Assay (  FDA approved for NASAL specimens only), is one component of a comprehensive MRSA colonization surveillance program. It is not intended to diagnose MRSA infection nor to guide or monitor treatment for MRSA infections.     Radiology Reports Dg Chest 2 View  07/02/2014   CLINICAL DATA:  Fever and altered level of consciousness.  EXAM: CHEST  2 VIEW  COMPARISON:  PA and lateral chest 03/30/2014 03/01/2014 and 09/22/2012.  FINDINGS: Port-A-Cath is in place. Mild streaky opacity in the left lung base is most consistent with atelectasis or scar. No consolidative process, pneumothorax or effusion is identified. Heart size is upper normal.  IMPRESSION: No acute abnormality.   Electronically Signed   By: Inge Rise M.D.   On: 07/02/2014 18:20    CBC  Recent Labs Lab 07/02/14 1643 07/03/14 0457  WBC 4.9 4.2  HGB 10.2* 9.6*  HCT 31.0* 29.5*  PLT 154 138*  MCV 99.0 99.3  MCH 32.6 32.3  MCHC 32.9 32.5  RDW 17.7* 17.7*  LYMPHSABS 0.5*  --   MONOABS 0.3  --   EOSABS 0.0  --   BASOSABS 0.0  --     Chemistries   Recent Labs Lab 07/02/14 1643 07/03/14 0457  NA 134* 137  K 4.7 4.1  CL 97 101  CO2 23 25  GLUCOSE 140* 126*  BUN 23 21  CREATININE 0.90 0.91  CALCIUM 9.4 9.2  AST 187*  --   ALT 138*  --   ALKPHOS 413*  --   BILITOT 0.7  --    ------------------------------------------------------------------------------------------------------------------ estimated creatinine clearance is 56.8 mL/min (by C-G formula based on Cr of 0.91). ------------------------------------------------------------------------------------------------------------------ No results for  input(s): HGBA1C in the last 72 hours. ------------------------------------------------------------------------------------------------------------------ No results for input(s): CHOL, HDL, LDLCALC, TRIG, CHOLHDL, LDLDIRECT in the last 72 hours. ------------------------------------------------------------------------------------------------------------------ No results for input(s): TSH, T4TOTAL, T3FREE, THYROIDAB in the last 72 hours.  Invalid input(s): FREET3 ------------------------------------------------------------------------------------------------------------------ No results for input(s): VITAMINB12, FOLATE, FERRITIN, TIBC, IRON, RETICCTPCT in the last 72 hours.  Coagulation profile No results for input(s): INR, PROTIME in the last 168 hours.  No results for input(s): DDIMER in the last 72 hours.  Cardiac Enzymes  Recent Labs Lab 07/02/14 1845  TROPONINI <0.30   ------------------------------------------------------------------------------------------------------------------ Invalid input(s): POCBNP     Time Spent in minutes   30 minutes   Stefany Starace M.D on 07/03/2014 at 5:03 PM  Between 7am to 7pm - Pager - [phone]  After 7pm go to [email or web address] - password [email or web address]  And look for the night coverage person covering for me after hours  [facility] Office  [phone]   **Disclaimer: This note may have been dictated with voice recognition software. Similar sounding words can inadvertently be transcribed and this note may contain transcription errors which may not have been corrected upon publication of note.**

## 2014-07-03 NOTE — Progress Notes (Signed)
INITIAL NUTRITION ASSESSMENT  DOCUMENTATION CODES Per approved criteria  -Severe malnutrition in the context of chronic illness  Pt meets criteria for severe MALNUTRITION in the context of chronic illness as evidenced by PO intake < 75% for > 3 months, 12% body weight loss in < 6 months.   INTERVENTION: -Encouraged PO intake and addition of snacks as warranted -Assisted in meal ordering -Consider utilizing supplements if PO intake < 75% of estimated needs d/t hx of unintentional wt loss  NUTRITION DIAGNOSIS: Unintentional wt loss related to chronic disease/unintentional wt los as evidenced by >20 lb weight loss in < 6 months.   Goal: Pt to meet >/= 90% of their estimated nutrition needs    Monitor:  Total protein/energy intake, labs, weights  Reason for Assessment: MST  80 y.o. male  Admitting Dx: Acute delirium  ASSESSMENT: 80 yo male h/o stage 4 pancreatic cancer comes in with high fever today up to 104 at snf. Pt denies any cough, no n/v/d. No pain different than what he has associated with his cancer. No sob. No swelling. pts hcpoa is present and says he turns the heat up in his room to 86 and its always hot in there. He is still getting chemo, and he has developed another lesion on his liver in the last couple of weeks  -Pt endorsed unintentional wt loss of 20-30 lbs in past several months (approximately June/July 2015) (12% body weight loss, severe for time frame) -Reported weight has been able to stabilize around 165 lb for past few months -Diet recall indicates pt consumes 50% of three meals daily at [facility]. Denied use of snacks or supplements. Decreased intake r/t dislike of SNF foods -Eating well during admit, > 80% of meals. Noted some nausea post meals. Provided pt with gingerale to assist with nausea, and assisted pt with ordering meals, encouraged blander foods (mashed potatoes, green beans, etc) -pt declined use of supplements at this time, consider if PO intake  decreases to < 75%  Height: Ht Readings from Last 1 Encounters:  07/02/14 5\' 10"  (1.778 m)    Weight: Wt Readings from Last 1 Encounters:  07/02/14 167 lb 14.4 oz (76.159 kg)    Ideal Body Weight: 166 lb  % Ideal Body Weight: 101%  Wt Readings from Last 10 Encounters:  07/02/14 167 lb 14.4 oz (76.159 kg)  06/28/14 169 lb (76.658 kg)  06/20/14 167 lb (75.751 kg)  06/06/14 169 lb (76.658 kg)  05/30/14 166 lb (75.297 kg)  05/09/14 174 lb (78.926 kg)  05/02/14 173 lb (78.472 kg)  04/25/14 173 lb (78.472 kg)  04/16/14 172 lb (78.019 kg)  04/10/14 172 lb (78.019 kg)    Usual Body Weight: 190 lb  % Usual Body Weight: 88%  BMI:  Body mass index is 24.09 kg/(m^2).  Estimated Nutritional Needs: Kcal: 1800-2000 Protein: 100-115 gram Fluid: >/= 1800 ml daily  Skin: WDL  Diet Order: Diet Heart  EDUCATION NEEDS: -No education needs identified at this time   Intake/Output Summary (Last 24 hours) at 07/03/14 1136 Last data filed at 07/03/14 0900  Gross per 24 hour  Intake    240 ml  Output    375 ml  Net   -135 ml    Last BM: 12/14   Labs:   Recent Labs Lab 07/02/14 1643 07/03/14 0457  NA 134* 137  K 4.7 4.1  CL 97 101  CO2 23 25  BUN 23 21  CREATININE 0.90 0.91  CALCIUM 9.4 9.2  GLUCOSE 140* 126*    CBG (last 3)  No results for input(s): GLUCAP in the last 72 hours.  Scheduled Meds: . apixaban  2.5 mg Oral BID  . carvedilol  12.5 mg Oral BID WC  . docusate sodium  100 mg Oral BID  . lactose free nutrition  237 mL Oral Q1500  . piperacillin-tazobactam (ZOSYN)  IV  3.375 g Intravenous Q8H  . pravastatin  40 mg Oral QPM  . pregabalin  75 mg Oral q morning - 10a  . sodium chloride  3 mL Intravenous Q12H  . sodium chloride  3 mL Intravenous Q12H  . temazepam  15 mg Oral QHS  . vancomycin  750 mg Intravenous Q12H    Continuous Infusions:   Past Medical History  Diagnosis Date  . Diverticulosis of colon (without mention of hemorrhage)   .  Irritable bowel syndrome   . Personal history of malignant neoplasm of prostate     s/p  radioactive seed implants 2000  . Unspecified hereditary and idiopathic peripheral neuropathy   . Nonischemic cardiomyopathy cardiologist-  dr Aundra Dubin    EF 25%  per last echo 2012 and cardiologist note--  2007-  ef  40-45%;  2008- ef 35-40%;  2010- ef 30-35%;  2012- ef 25%  . Sinus bradycardia   . Hypertension   . Arthritis     hands  . Hyperlipidemia   . History of DVT of lower extremity     2002  ,   left leg  . History of adenomatous polyp of colon     2001  . History of basal cell carcinoma excision   . Anticoagulated on Coumadin   . Type 2 diabetes, diet controlled   . GERD (gastroesophageal reflux disease)   . LBBB (left bundle branch block)   . Wears glasses   . Wears hearing aid     BILATERAL  . Unsteady gait   . DDD (degenerative disc disease)     cervical and lumbar  . DJD (degenerative joint disease)     hips  . Lower urinary tract symptoms (LUTS)   . 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 Coronary artery disease     per cath 2002  non-obstructive cad  . Chronic systolic heart failure   . H/O fracture of nose     12-30-2013-  pt fell at home open nose bone fx    Past Surgical History  Procedure Laterality Date  . Knee arthroscopy Left 1991  . Ercp with sphincterotomy and stone removal  05/ 1993  &  05-11-2006  . Total knee arthroplasty  10/05/2011    Procedure: TOTAL KNEE ARTHROPLASTY;  Surgeon: Gearlean Alf, MD;  Location: [facility];  Service: Orthopedics;  Laterality: Right;  . Transurethral resection of bladder tumor N/A 09/30/2012    Procedure: TRANSURETHRAL RESECTION OF BLADDER TUMOR (TURBT);  Surgeon: Franchot Gallo, MD;  Location: [facility];  Service: Urology;  Laterality: N/A;  WITH MITOMYCIN INSTILLATION   . Eus N/A 01/04/2014    Procedure: ESOPHAGEAL ENDOSCOPIC ULTRASOUND  (EUS) RADIAL;  Surgeon: [name], MD;  Location: [facility];  Service: Endoscopy;  Laterality: N/A;  . Radioactive prostate seed implants  01/ 2000  . Mohs surgery  03/ 2009    LEFT EAR BASAL CELL CARCINOMA  . Tonsillectomy  AS CHILD  . Cholecystectomy open  1985  . Appendectomy  child  .  Inguinal hernia repair Bilateral 1998    recurrent RIH  w/ repair 06-21-2006  . Total knee arthroplasty Left 02-21-2007    AND 04-21-2007  CLOSED MANIPULATION  . Cataract extraction w/ intraocular lens implant Right 2004  . Lumbar spine surgery  X2  . Cardiac catheterization  06-14-2001  dr Darnell Level brodie    non-obstructive CAD/  LM 30%,  pLAD 50%,  mLAD 30%,  RCA  &  CFX  irregularites ,  global hypokinesis,  ef 40%  . Cardiovascular stress test  02-07-2007   dr Darnell Level brodie    negative for ischemia,  low risk myoview perfusion study  . Transthoracic echocardiogram  01-28-2011    moderate LVH/  ef 25%/  diffuse hypokinesis/  mild LAE/  mild TR/  trivial AR & MR  . Cystoscopy w/ retrogrades Bilateral 03/09/2014    Procedure: CYSTOSCOPY WITH BILATERAL RETROGRADE PYELOGRAM BLADDER BX AND RENAL WASHINGS.;  Surgeon: Jorja Loa, MD;  Location: [facility];  Service: Urology;  Laterality: Bilateral;  . Eus N/A 04/05/2014    Procedure: UPPER ENDOSCOPIC ULTRASOUND (EUS) LINEAR;  Surgeon: [name], MD;  Location: [facility];  Service: Endoscopy;  Laterality: N/A;  radial linear  . Endoscopic retrograde cholangiopancreatography (ercp) with propofol N/A 04/12/2014    Procedure: ENDOSCOPIC RETROGRADE CHOLANGIOPANCREATOGRAPHY (ERCP) WITH PROPOFOL;  Surgeon: [name], MD;  Location: [facility];  Service: Endoscopy;  Laterality: N/A;  metal stent  . Biliary stent placement N/A 04/12/2014    Procedure: BILIARY STENT PLACEMENT;  Surgeon: [name], MD;  Location: [facility];  Service: Endoscopy;  Laterality: N/A;    Atlee Abide MS RD LDN Clinical  Dietitian [phone]

## 2014-07-03 NOTE — Clinical Social Work Note (Signed)
Clinical Social Work Department BRIEF PSYCHOSOCIAL ASSESSMENT 07/03/2014  Patient:  [name] [name], [name] [name]     Account Number:  [id number]     Admit date:  07/02/2014  Clinical Social Worker:  Daiva Huge  Date/Time:  07/03/2014 01:59 PM  Referred by:  Physician  Date Referred:  07/03/2014 Referred for  SNF Placement   Other Referral:   Interview type:  Other - See comment Other interview type:   Spoke with patient who reports he has been living in SNF unit at [facility] for about 3 months- he plans to return there at [employer] and has requested I speak with his [place] also.    PSYCHOSOCIAL DATA Living Status:  FACILITY Admitted from facility:  [facility] Level of care:  [place] Primary support name:  [name] [name] Primary support relationship to patient:  FAMILY Degree of support available:   good    CURRENT CONCERNS Current Concerns  Post-Acute Placement   Other Concerns:    SOCIAL WORK ASSESSMENT / PLAN Patient and HCPOA are agreeable to plans for return to SNF bed at [facility]   Assessment/plan status:  Other - See comment Other assessment/ plan:   FL2 for SNF return   Information/referral to community resources:    PATIENT'S/FAMILY'S RESPONSE TO PLAN OF CARE: CSW has communicated with SNF rep as well as patient and his HCPOA- planning return to SNF when medically stable.       Eduard Clos, MSW, [place]

## 2014-07-03 NOTE — Care Management Note (Signed)
    Page 1 of 1   07/03/2014     12:42:33 PM CARE MANAGEMENT NOTE 07/03/2014  Patient:  ALRIC, GEISE   Account Number:  [id number]  Date Initiated:  07/03/2014  Documentation initiated by:  Sunday Spillers  Subjective/Objective Assessment:   80 yo male admitted with fever, AMS. PTA lived at SNF     Action/Plan:   Return to SNF when stable   Anticipated DC Date:  07/03/2014   Anticipated DC Plan:  SKILLED NURSING FACILITY  In-house referral  Clinical Social Worker      DC Planning Services  CM consult      Choice offered to / List presented to:             Status of service:  Completed, signed off Medicare Important Message given?   (If response is "NO", the following Medicare IM given date fields will be blank) Date Medicare IM given:   Medicare IM given by:   Date Additional Medicare IM given:   Additional Medicare IM given by:    Discharge Disposition:  [place]  Per UR Regulation:  Reviewed for med. necessity/level of care/duration of stay  If discussed at [place] of Stay Meetings, dates discussed:    Comments:

## 2014-07-04 ENCOUNTER — Inpatient Hospital Stay (HOSPITAL_COMMUNITY): Payer: Medicare Other

## 2014-07-04 DIAGNOSIS — Z86718 Personal history of other venous thrombosis and embolism: Secondary | ICD-10-CM

## 2014-07-04 DIAGNOSIS — C250 Malignant neoplasm of head of pancreas: Secondary | ICD-10-CM

## 2014-07-04 DIAGNOSIS — C259 Malignant neoplasm of pancreas, unspecified: Principal | ICD-10-CM

## 2014-07-04 LAB — CBC
HCT: 30.8 % — ABNORMAL LOW (ref 39.0–52.0)
HEMOGLOBIN: 10.1 g/dL — AB (ref 13.0–17.0)
MCH: 32.7 pg (ref 26.0–34.0)
MCHC: 32.8 g/dL (ref 30.0–36.0)
MCV: 99.7 fL (ref 78.0–100.0)
Platelets: 168 10*3/uL (ref 150–400)
RBC: 3.09 MIL/uL — ABNORMAL LOW (ref 4.22–5.81)
RDW: 17.5 % — AB (ref 11.5–15.5)
WBC: 4.9 10*3/uL (ref 4.0–10.5)

## 2014-07-04 LAB — BASIC METABOLIC PANEL
Anion gap: 9 (ref 5–15)
BUN: 19 mg/dL (ref 6–23)
CO2: 28 mEq/L (ref 19–32)
CREATININE: 0.97 mg/dL (ref 0.50–1.35)
Calcium: 9.2 mg/dL (ref 8.4–10.5)
Chloride: 101 mEq/L (ref 96–112)
GFR calc Af Amer: 82 mL/min — ABNORMAL LOW (ref >90)
GFR, EST NON AFRICAN AMERICAN: 71 mL/min — AB (ref >90)
GLUCOSE: 148 mg/dL — AB (ref 70–99)
Potassium: 4.2 mEq/L (ref 3.7–5.3)
Sodium: 138 mEq/L (ref 137–147)

## 2014-07-04 MED ORDER — FUROSEMIDE 10 MG/ML IJ SOLN
20.0000 mg | Freq: Once | INTRAMUSCULAR | Status: AC
  Administered 2014-07-04: 20 mg via INTRAVENOUS
  Filled 2014-07-04: qty 2

## 2014-07-04 NOTE — Progress Notes (Signed)
[facility] PROGRESS NOTE  [name] [name] HFW:[id number] DOB: [birth year] DOA: 07/02/2014 PCP: Drema Pry, DO  Assessment/Plan: #1 fever Unknown etiology. Chest x-ray was negative. Urinalysis is negative. Blood cultures were no growth to date. Currently afebrile. Continue empiric IV vancomycin and IV Zosyn. We'll transition to oral Levaquin tomorrow. Follow.  #2 acute encephalopathy Likely secondary to prone #1. Source of fever unknown. Patient has been pancultured cultures are pending. Patient has improved clinically. Continue empiric IV antibiotics.  #3 pancreatic cancer with liver metastases Per oncology.  #4 history of DVT Eliquis  Code Status: Partial Family Communication: Updated patient of family at bedside. Disposition Plan: SNF when medically stable.   Consultants:  Oncology:Dr Ennever 07/04/2014  Procedures:  Chest x-ray 07/02/2014  Antibiotics:  IV vancomycin 07/02/2014  IV Zosyn 07/02/2014  HPI/Subjective: Patient states not feeling too well. Patient complaining of a cough.  Objective: Filed Vitals:   07/04/14 2044  BP: 101/50  Pulse: 51  Temp: 98.8 F (37.1 C)  Resp: 18    Intake/Output Summary (Last 24 hours) at 07/04/14 2121 Last data filed at 07/04/14 1722  Gross per 24 hour  Intake    720 ml  Output   2650 ml  Net  -1930 ml   Filed Weights   07/02/14 2115 07/03/14 0523 07/04/14 0514  Weight: 76.159 kg (167 lb 14.4 oz) 76.159 kg (167 lb 14.4 oz) 75.751 kg (167 lb)    Exam:   General:  NAD  Cardiovascular: RRR  Respiratory: CTAB. Minimal expiratory wheezing.  Abdomen: Soft, nontender, nondistended, positive bowel sounds.  Musculoskeletal: No clubbing cyanosis or edema.  Data Reviewed: Basic Metabolic Panel:  Recent Labs Lab 07/02/14 1643 07/03/14 0457 07/04/14 0504  NA 134* 137 138  K 4.7 4.1 4.2  CL 97 101 101  CO2 23 25 28   GLUCOSE 140* 126* 148*  BUN 23 21 19   CREATININE 0.90 0.91 0.97  CALCIUM 9.4 9.2 9.2    Liver Function Tests:  Recent Labs Lab 07/02/14 1643  AST 187*  ALT 138*  ALKPHOS 413*  BILITOT 0.7  PROT 7.0  ALBUMIN 3.2*    Recent Labs Lab 07/02/14 1845  LIPASE 7*   No results for input(s): AMMONIA in the last 168 hours. CBC:  Recent Labs Lab 07/02/14 1643 07/03/14 0457 07/04/14 0504  WBC 4.9 4.2 4.9  NEUTROABS 4.0  --   --   HGB 10.2* 9.6* 10.1*  HCT 31.0* 29.5* 30.8*  MCV 99.0 99.3 99.7  PLT 154 138* 168   Cardiac Enzymes:  Recent Labs Lab 07/02/14 1845  TROPONINI <0.30   BNP (last 3 results) No results for input(s): PROBNP in the last 8760 hours. CBG: No results for input(s): GLUCAP in the last 168 hours.  Recent Results (from the past 240 hour(s))  Culture, blood (routine x 2)     Status: None (Preliminary result)   Collection Time: 07/02/14  4:43 PM  Result Value Ref Range Status   Specimen Description BLOOD BLOOD LEFT FOREARM  Final   Special Requests BOTTLES DRAWN AEROBIC AND ANAEROBIC 5ML  Final   Culture  Setup Time   Final    07/02/2014 20:43 Performed at [facility]    Culture   Final           BLOOD CULTURE RECEIVED NO GROWTH TO DATE CULTURE WILL BE HELD FOR 5 DAYS BEFORE ISSUING A FINAL NEGATIVE REPORT Performed at [facility]    Report Status PENDING  Incomplete  Culture, blood (  routine x 2)     Status: None (Preliminary result)   Collection Time: 07/02/14  5:07 PM  Result Value Ref Range Status   Specimen Description BLOOD BLOOD RIGHT FOREARM  Final   Special Requests BOTTLES DRAWN AEROBIC AND ANAEROBIC 3ML  Final   Culture  Setup Time   Final    07/02/2014 20:43 Performed at [facility]    Culture   Final           BLOOD CULTURE RECEIVED NO GROWTH TO DATE CULTURE WILL BE HELD FOR 5 DAYS BEFORE ISSUING A FINAL NEGATIVE REPORT Performed at [facility]    Report Status PENDING  Incomplete  Urine culture     Status: None   Collection Time: 07/02/14  5:10 PM  Result Value Ref Range  Status   Specimen Description URINE, CLEAN CATCH  Final   Special Requests NONE  Final   Culture  Setup Time   Final    07/02/2014 20:54 Performed at [place] Performed at [facility]   Final   Culture NO GROWTH Performed at [facility]   Final   Report Status 07/03/2014 FINAL  Final  MRSA PCR Screening     Status: None   Collection Time: 07/02/14 10:04 PM  Result Value Ref Range Status   MRSA by PCR NEGATIVE NEGATIVE Final    Comment:        The GeneXpert MRSA Assay (FDA approved for NASAL specimens only), is one component of a comprehensive MRSA colonization surveillance program. It is not intended to diagnose MRSA infection nor to guide or monitor treatment for MRSA infections.      Studies: Dg Chest Port 1 View  07/04/2014   CLINICAL DATA:  Productive cough, prostate cancer, hypertension, type 2 diabetes, bladder cancer, coronary artery disease, chronic systolic heart failure  EXAM: PORTABLE CHEST - 1 VIEW  COMPARISON:  Portable exam 1445 hr compared to 07/02/2014  FINDINGS: RIGHT jugular Port-A-Cath with tip projecting over distal SVC.  Enlargement of cardiac silhouette.  Mildly tortuous aorta with atherosclerotic calcification.  Pulmonary vascular congestion.  Bibasilar atelectasis.  No infiltrate, pleural effusion or pneumothorax.  IMPRESSION: Enlargement of cardiac silhouette with slight pulmonary vascular congestion.  Bibasilar atelectasis.   Electronically Signed   By: Lavonia Dana M.D.   On: 07/04/2014 16:11    Scheduled Meds: . apixaban  2.5 mg Oral BID  . carvedilol  12.5 mg Oral BID WC  . docusate sodium  100 mg Oral BID  . lactose free nutrition  237 mL Oral Q1500  . piperacillin-tazobactam (ZOSYN)  IV  3.375 g Intravenous Q8H  . pravastatin  40 mg Oral QPM  . pregabalin  75 mg Oral q morning - 10a  . sodium chloride  3 mL Intravenous Q12H  . sodium chloride  3 mL Intravenous Q12H  . temazepam  15 mg  Oral QHS  . vancomycin  750 mg Intravenous Q12H   Continuous Infusions:   Principal Problem:   Acute delirium Active Problems:   Coronary atherosclerosis   SYSTOLIC HEART FAILURE, CHRONIC   Liver metastasis   Pancreatic cancer   Fever presenting with conditions classified elsewhere    Time spent: 52 minutes    THOMPSON,DANIEL M.D. [facility] Pager [phone]. If 7PM-7AM, please contact night-coverage at [email or web address], password [facility] 07/04/2014, 9:21 PM  LOS: 2 days

## 2014-07-04 NOTE — Consult Note (Signed)
[name] [name]  [phone]   Requesting Provider: [facility]  Consulting Provider: Allen Kell  Primary Oncologist: Riley Churches CONSULTATION  NOTE  Reason for Consultation: Pancreatic Cancer  HPI: This is an 80 year old white male with a history of pancreatic cancer metastatic to the liver, currently on Xeloda therapy, admitted On 07/02/2018 with acute mental status changes from his SNF.The patient was febrile with a  Tmax 103.1. He also had nonproductive cough and runny nose, No nausea vomiting or diarrhea. Urinalysis was suspicious for UTI, which is ever current issue in this patient as he uses a catheter. Chest x-ray was unremarkable. 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 On admission, the patient was placed on Zosyn  and vancomycin IV, IV fluids, and was closely monitored.  CURRENT TREATMENT: Xeloda 1500 mg BID (14 days on 7 off) , started on 06/06/14 Eliquis 2.5 mg BID   Past Medical History  Diagnosis Date  . Diverticulosis of colon (without mention of hemorrhage)   . Irritable bowel syndrome   . Personal history of malignant neoplasm of prostate     s/p  radioactive seed implants 2000  . Unspecified hereditary and idiopathic peripheral neuropathy   . Nonischemic cardiomyopathy cardiologist-  dr Aundra Dubin    EF 25%  per last echo 2012 and cardiologist note--  2007-  ef  40-45%;  2008- ef 35-40%;  2010- ef 30-35%;  2012- ef 25%  . Sinus bradycardia   . Hypertension   . Arthritis     hands  . Hyperlipidemia   . History of DVT of lower extremity     2002  ,   left leg  . History of adenomatous polyp of colon     2001  . History of basal cell carcinoma excision   . Anticoagulated on Coumadin   . Type 2 diabetes, diet controlled   . GERD (gastroesophageal reflux disease)   . LBBB (left bundle branch block)   . Wears glasses   . Wears hearing aid      BILATERAL  . Unsteady gait   . DDD (degenerative disc disease)     cervical and lumbar  . DJD (degenerative joint disease)     hips  . Lower urinary tract symptoms (LUTS)   . 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 Coronary artery disease     per cath 2002  non-obstructive cad  . Chronic systolic heart failure   . H/O fracture of nose     12-30-2013-  pt fell at home open nose bone fx      Past Surgical History  Procedure Laterality Date  . Knee arthroscopy Left 1991  . Ercp with sphincterotomy and stone removal  05/ 1993  &  05-11-2006  . Total knee arthroplasty  10/05/2011    Procedure: TOTAL KNEE ARTHROPLASTY;  Surgeon: Gearlean Alf, MD;  Location: [facility];  Service: Orthopedics;  Laterality: Right;  . Transurethral resection of bladder tumor N/A 09/30/2012    Procedure: TRANSURETHRAL RESECTION OF BLADDER TUMOR (TURBT);  Surgeon: Franchot Gallo, MD;  Location: [facility];  Service: Urology;  Laterality: N/A;  WITH MITOMYCIN INSTILLATION   . Eus N/A 01/04/2014    Procedure: ESOPHAGEAL ENDOSCOPIC ULTRASOUND (EUS) RADIAL;  Surgeon: [name], MD;  Location: [facility];  Service: Endoscopy;  Laterality: N/A;  .  Radioactive prostate seed implants  01/ 2000  . Mohs surgery  03/ 2009    LEFT EAR BASAL CELL CARCINOMA  . Tonsillectomy  AS CHILD  . Cholecystectomy open  1985  . Appendectomy  child  . Inguinal hernia repair Bilateral 1998    recurrent RIH  w/ repair 06-21-2006  . Total knee arthroplasty Left 02-21-2007    AND 04-21-2007  CLOSED MANIPULATION  . Cataract extraction w/ intraocular lens implant Right 2004  . Lumbar spine surgery  X2  . Cardiac catheterization  06-14-2001  dr Darnell Level brodie    non-obstructive CAD/  LM 30%,  pLAD 50%,  mLAD 30%,  RCA  &  CFX  irregularites ,  global hypokinesis,  ef 40%  . Cardiovascular stress test  02-07-2007   dr Darnell Level brodie     negative for ischemia,  low risk myoview perfusion study  . Transthoracic echocardiogram  01-28-2011    moderate LVH/  ef 25%/  diffuse hypokinesis/  mild LAE/  mild TR/  trivial AR & MR  . Cystoscopy w/ retrogrades Bilateral 03/09/2014    Procedure: CYSTOSCOPY WITH BILATERAL RETROGRADE PYELOGRAM BLADDER BX AND RENAL WASHINGS.;  Surgeon: Jorja Loa, MD;  Location: [facility];  Service: Urology;  Laterality: Bilateral;  . Eus N/A 04/05/2014    Procedure: UPPER ENDOSCOPIC ULTRASOUND (EUS) LINEAR;  Surgeon: [name], MD;  Location: [facility];  Service: Endoscopy;  Laterality: N/A;  radial linear  . Endoscopic retrograde cholangiopancreatography (ercp) with propofol N/A 04/12/2014    Procedure: ENDOSCOPIC RETROGRADE CHOLANGIOPANCREATOGRAPHY (ERCP) WITH PROPOFOL;  Surgeon: [name], MD;  Location: [facility];  Service: Endoscopy;  Laterality: N/A;  metal stent  . Biliary stent placement N/A 04/12/2014    Procedure: BILIARY STENT PLACEMENT;  Surgeon: [name], MD;  Location: [facility];  Service: Endoscopy;  Laterality: N/A;    MEDICATIONS:  Scheduled Meds: . apixaban  2.5 mg Oral BID  . carvedilol  12.5 mg Oral BID WC  . docusate sodium  100 mg Oral BID  . lactose free nutrition  237 mL Oral Q1500  . piperacillin-tazobactam (ZOSYN)  IV  3.375 g Intravenous Q8H  . pravastatin  40 mg Oral QPM  . pregabalin  75 mg Oral q morning - 10a  . sodium chloride  3 mL Intravenous Q12H  . sodium chloride  3 mL Intravenous Q12H  . temazepam  15 mg Oral QHS  . vancomycin  750 mg Intravenous Q12H   Continuous Infusions: . sodium chloride 50 mL/hr at 07/03/14 1732   PRN Meds:.sodium chloride, alum & mag hydroxide-simeth, bisacodyl, feeding supplement (GLUCERNA SHAKE), HYDROcodone-acetaminophen, HYDROmorphone (DILAUDID) injection, LORazepam, nitroGLYCERIN, ondansetron **OR** ondansetron (ZOFRAN) IV, polyethylene glycol, prochlorperazine, sodium  chloride  ALLERGIES: No Known Allergies  Family History  Problem Relation Age of Onset  . Throat cancer Brother   . Coronary artery disease Father     deceased       History   Social History  . Marital Status: Widowed    Spouse Name: N/A    Number of Children: 0  . Years of Education: N/A   Occupational History  .     Social History Main Topics  . Smoking status: Former Smoker -- 1.00 packs/day for 10 years    Types: Cigarettes    Start date: 04/10/1954    Quit date: 09/10/1982  . Smokeless tobacco: Never Used     Comment: quit 32 years ago  . Alcohol Use: No  .  Drug Use: No  . Sexual Activity: Not on file   Other Topics Concern  . Not on file   Social History Narrative   Married, no children.      PHYSICAL EXAMINATION:  Filed Vitals:   07/04/14 0514  BP: 125/51  Pulse: 51  Temp: 97.5 F (36.4 C)  Resp: 18      Intake/Output Summary (Last 24 hours) at 07/04/14 4196 Last data filed at 07/04/14 0515  Gross per 24 hour  Intake    720 ml  Output   3475 ml  Net  -2755 ml    ECOG PERFORMANCE STATUS: 3-4  GENERAL: alert, no distress and comfortable. He is somewhat confused. SKIN: skin color, texture, turgor are normal, no rashes or significant lesions EYES: normal, conjunctiva are pink and non-injected, sclera clear OROPHARYNX:no exudate, no erythema and lips, buccal mucosa, and tongue normal  NECK: supple, thyroid normal size, non-tender, without nodularity LYMPH:  no palpable lymphadenopathy in the cervical, axillary or inguinal LUNGS: clear to auscultation and percussion with normal breathing effort HEART: regular rate & rhythm and no murmurs and no lower extremity edema ABDOMEN:abdomen soft, non-tender and normal bowel sounds Musculoskeletal:no cyanosis of digits and no clubbing  PSYCH: alert & oriented to self and place. Unable to tell year or date NEURO: no focal motor/sensory deficits  LABORATORY/RADIOLOGY DATA:   Recent Labs Lab  07/02/14 1643 07/03/14 0457 07/04/14 0504  WBC 4.9 4.2 4.9  HGB 10.2* 9.6* 10.1*  HCT 31.0* 29.5* 30.8*  PLT 154 138* 168  MCV 99.0 99.3 99.7  MCH 32.6 32.3 32.7  MCHC 32.9 32.5 32.8  RDW 17.7* 17.7* 17.5*  LYMPHSABS 0.5*  --   --   MONOABS 0.3  --   --   EOSABS 0.0  --   --   BASOSABS 0.0  --   --     CMP    Recent Labs Lab 07/02/14 1643 07/03/14 0457 07/04/14 0504  NA 134* 137 138  K 4.7 4.1 4.2  CL 97 101 101  CO2 23 25 28   GLUCOSE 140* 126* 148*  BUN 23 21 19   CREATININE 0.90 0.91 0.97  CALCIUM 9.4 9.2 9.2  AST 187*  --   --   ALT 138*  --   --   ALKPHOS 413*  --   --   BILITOT 0.7  --   --         Component Value Date/Time   BILITOT 0.7 07/02/2014 1643   BILITOT 0.80 06/20/2014 1041   BILIDIR 0.1 01/31/2014 1608        Component Value Date/Time   ESRSEDRATE 37* 04/28/2013 0946    No results for input(s): INR, PROTIME in the last 168 hours.    Urinalysis    Component Value Date/Time   COLORURINE AMBER* 07/02/2014 1710   APPEARANCEUR CLEAR 07/02/2014 1710   LABSPEC 1.020 07/02/2014 1710   PHURINE 5.0 07/02/2014 1710   GLUCOSEU NEGATIVE 07/02/2014 1710   GLUCOSEU NEGATIVE 08/23/2008 0849   HGBUR MODERATE* 07/02/2014 1710   BILIRUBINUR NEGATIVE 07/02/2014 1710   KETONESUR NEGATIVE 07/02/2014 1710   PROTEINUR NEGATIVE 07/02/2014 1710   UROBILINOGEN 1.0 07/02/2014 1710   NITRITE NEGATIVE 07/02/2014 1710   LEUKOCYTESUR NEGATIVE 07/02/2014 1710    Drugs of Abuse  No results found for: LABOPIA, COCAINSCRNUR, LABBENZ, AMPHETMU, THCU, LABBARB   Liver Function Tests:  Recent Labs Lab 07/02/14 1643  AST 187*  ALT 138*  ALKPHOS 413*  BILITOT 0.7  PROT 7.0  ALBUMIN 3.2*    Recent Labs Lab 07/02/14 1845  LIPASE 7*   Cardiac Enzymes:  Recent Labs Lab 07/02/14 1845  TROPONINI <0.30   Radiology Studies:  Dg Chest 2 View  07/02/2014   CLINICAL DATA:  Fever and altered level of consciousness.  EXAM: CHEST  2 VIEW  COMPARISON:   PA and lateral chest 03/30/2014 03/01/2014 and 09/22/2012.  FINDINGS: Port-A-Cath is in place. Mild streaky opacity in the left lung base is most consistent with atelectasis or scar. No consolidative process, pneumothorax or effusion is identified. Heart size is upper normal.  IMPRESSION: No acute abnormality.   Electronically Signed   By: Inge Rise M.D.   On: 07/02/2014 18:20   Ct Abdomen Pelvis W Contrast  06/04/2014   CLINICAL DATA:  Metastatic pancreatic cancer, follow-up chemotherapy  EXAM: CT ABDOMEN AND PELVIS WITH CONTRAST  TECHNIQUE: Multidetector CT imaging of the abdomen and pelvis was performed using the standard protocol following bolus administration of intravenous contrast.  CONTRAST:  135mL OMNIPAQUE IOHEXOL 300 MG/ML  SOLN  COMPARISON:  MRI abdomen dated 03/30/2014. CT abdomen pelvis dated 03/29/2014.  FINDINGS: Lower chest:  Scarring/atelectasis at the lung bases.  Hepatobiliary: 2.8 x 4.5 cm hypoenhancing lesion in the posterior segment right hepatic dome (series 2/image 5), worrisome for metastasis. Additional 2.3 x 3.0 cm ill-defined lesion in the anterior segment right hepatic dome (series 2/ image 5), worrisome for additional metastasis.  Two benign hepatic cysts in the left hepatic lobe (series 2/ image 11) and posterior segment right hepatic lobe (series 2/image 25).  Status post cholecystectomy. Pneumobilia with layering sludge/debris in the central intrahepatic duct (series 2/image 14). Indwelling common duct stent stent.  Pancreas: Ill-defined mass in the region of the pancreatic head/ uncinate process, measuring approximately 2.6 x 4.1 cm (series 2/image 42), previously 2.9 x 4.9 cm. Associated atrophy of the pancreatic body/tail.  Spleen: Within normal limits.  Adrenals/Urinary Tract: Adrenal glands are unremarkable.  Kidneys are within normal limits.  No hydronephrosis.  Bladder is thick-walled but underdistended.  Stomach/Bowel: Stomach is unremarkable.  No evidence of  bowel obstruction.  Colonic diverticulosis, without associated inflammatory changes.  Vascular/Lymphatic: Atherosclerotic calcifications of the abdominal aorta and branch vessels.  Tumor involves the SMA and SMV (series 2/image 23). Collateral vessels in the porta hepatis.  Small upper abdominal lymph nodes measuring 5-6 mm short axis (series 2/image 17), previously 9 mm.  Reproductive: Brachytherapy seeds in the region of the prostate.  Other: No abdominopelvic ascites.  Musculoskeletal: Degenerative changes of the visualized thoracolumbar spine.  IMPRESSION: 2.6 x 4.1 cm mass in the region of the pancreatic head/uncinate process, corresponding to known pancreatic neoplasm, mildly decreased.  Two hypoenhancing lesions in the right liver, suspicious for metastases, measuring up to 4.5 cm as described above.  Small upper abdominal lymph nodes measuring up to 6 mm short axis, mildly decreased.  Pneumobilia with indwelling common duct stent.   Electronically Signed   By: Julian Hy M.D.   On: 06/04/2014 12:33       ASSESSMENT AND PLAN:  Acute mental status changes Likely secondary to UTI and fever Currently, his confusion is improving. Continue with vancomycin and Zosyn as per admitting team, along with IV fluids  History of Pancreatic Cancer with liver metastasis The patient is on Xeloda  Unlikely that these acute mental status changes are related to the medicine. He is to continue present dose as he is expected to be discharged soon  History of DVT He is on  Eliquis, continue present therapy  Malnutrition Appreciate nutrition evaluation Per chart notes, Encouraged oral intake and addition of snacks as warranted, as well as nutritional supplements  DNI  Other medical issues as per admitting team   **Disclaimer: This note was dictated with voice recognition software. Similar sounding words can inadvertently be transcribed and this note may contain transcription errors which may not  have been corrected upon publication of note.**  WERTMAN,SARA E, PA-C 07/04/2014, 8:21 AM   ADDENDUM:  I agree with the note above. So far, I think the cultures are negative to date. However, I still think that there is some element of sepsis and infection that caused his change in clinical status.  He has metastatic pancreatic cancer. He progressed on first line therapy. I have him on Xeloda as second line therapy. It is too early to know if he is going to respond. I think he has not had 2 cycles of treatment.  His quality of life is much more important for me. As such, I would hold the Xeloda for right now. I don't see that we are going to gain anything by having him on this in the hospital. I would much rather see him to be more active and functional before we try to entertain any thought to treating the metastatic pancreatic cancer.  I cannot think of any other test that we need to do right now. I would not check his tumor marker.  I would continue his antibiotics for right now. IV fluids are important. I would check a prealbumin on him. The nutrition service is seeing him to try to help optimize his nutritional intake.  I appreciate all the great care that he is getting from the hospitalist and the staff on 4 W.!!!!  Pete E  Proverbs 3:5-6

## 2014-07-04 NOTE — Clinical Social Work Note (Signed)
CSW has been informed patient is not ready for d/c.  Updated SNF and will f/u tomorrow for further planning. Eduard Clos, MSW, [place]

## 2014-07-04 NOTE — Progress Notes (Signed)
OT Cancellation Note  Patient Details Name: [name] [name] MRN: [id number] DOB: January 10, 1923   Cancelled Treatment:    Reason Eval/Treat Not Completed: Other (comment) Pt from SNF. Plan is to return to SNF ? tomorrow.Will defer OT to SNF as appropriate. Wink[place], OTR/L  [phone] 07/04/2014 07/04/2014, 3:50 PM

## 2014-07-04 NOTE — Clinical Documentation Improvement (Signed)
  Per 12/15 Registered Dietician eval "meets criteria for Severe protein calorie malnutrition in the context of chronic illness as evidenced by PO intake < 75% for > 3 months, 12% body weight loss in < 6 months". This is a significant finding. If you agree please add to documentation to illustrate severity of illness and risk of mortality.  Thank You, Barrie Dunker RN CDIS [phone] HIM department

## 2014-07-05 DIAGNOSIS — E43 Unspecified severe protein-calorie malnutrition: Secondary | ICD-10-CM | POA: Diagnosis present

## 2014-07-05 HISTORY — DX: Unspecified severe protein-calorie malnutrition: E43

## 2014-07-05 LAB — CBC
HCT: 32.2 % — ABNORMAL LOW (ref 39.0–52.0)
Hemoglobin: 10.5 g/dL — ABNORMAL LOW (ref 13.0–17.0)
MCH: 33 pg (ref 26.0–34.0)
MCHC: 32.6 g/dL (ref 30.0–36.0)
MCV: 101.3 fL — ABNORMAL HIGH (ref 78.0–100.0)
PLATELETS: 203 10*3/uL (ref 150–400)
RBC: 3.18 MIL/uL — ABNORMAL LOW (ref 4.22–5.81)
RDW: 17.3 % — ABNORMAL HIGH (ref 11.5–15.5)
WBC: 4.5 10*3/uL (ref 4.0–10.5)

## 2014-07-05 LAB — COMPREHENSIVE METABOLIC PANEL
ALBUMIN: 3 g/dL — AB (ref 3.5–5.2)
ALT: 51 U/L (ref 0–53)
ANION GAP: 13 (ref 5–15)
AST: 30 U/L (ref 0–37)
Alkaline Phosphatase: 270 U/L — ABNORMAL HIGH (ref 39–117)
BILIRUBIN TOTAL: 0.4 mg/dL (ref 0.3–1.2)
BUN: 17 mg/dL (ref 6–23)
CALCIUM: 9.7 mg/dL (ref 8.4–10.5)
CO2: 29 mEq/L (ref 19–32)
CREATININE: 1.04 mg/dL (ref 0.50–1.35)
Chloride: 96 mEq/L (ref 96–112)
GFR calc Af Amer: 71 mL/min — ABNORMAL LOW (ref >90)
GFR calc non Af Amer: 62 mL/min — ABNORMAL LOW (ref >90)
Glucose, Bld: 137 mg/dL — ABNORMAL HIGH (ref 70–99)
Potassium: 4 mEq/L (ref 3.7–5.3)
Sodium: 138 mEq/L (ref 137–147)
Total Protein: 6.9 g/dL (ref 6.0–8.3)

## 2014-07-05 LAB — PREALBUMIN: Prealbumin: 12.1 mg/dL — ABNORMAL LOW (ref 17.0–34.0)

## 2014-07-05 MED ORDER — LEVOFLOXACIN 750 MG PO TABS
750.0000 mg | ORAL_TABLET | Freq: Every day | ORAL | Status: DC
  Administered 2014-07-05 – 2014-07-06 (×2): 750 mg via ORAL
  Filled 2014-07-05 (×2): qty 1

## 2014-07-05 NOTE — Evaluation (Signed)
Physical Therapy Evaluation Patient Details Name: [name] [name] MRN: [id number] DOB: Dec 07, 1922 Today's Date: 07/05/2014   History of Present Illness  80 yo male with h/o stage 4 pancreatic cancer with liver mets, DVT, bil TKA, chronic systolic heart failure/cardiomyopathy admitted for fever and acute delirium from SNF.  Clinical Impression  Pt admitted with above diagnosis. Pt currently with functional limitations due to the deficits listed below (see PT Problem List). Pt reports he is from [facility] while undergoing cancer treatment and plans to return there upon d/c.  Pt will benefit from skilled PT to increase their independence and safety with mobility to allow discharge to the venue listed below.       Follow Up Recommendations SNF (plans to return to SNF)    Equipment Recommendations  None recommended by PT    Recommendations for Other Services       Precautions / Restrictions Precautions Precautions: Fall      Mobility  Bed Mobility Overal bed mobility: Needs Assistance Bed Mobility: Supine to Sit     Supine to sit: Supervision        Transfers Overall transfer level: Needs assistance Equipment used: 1 person hand held assist Transfers: Sit to/from Stand Sit to Stand: Min assist         General transfer comment: assist to rise and steady  Ambulation/Gait Ambulation/Gait assistance: Min assist Ambulation Distance (Feet): 200 Feet Assistive device: 1 person hand held assist Gait Pattern/deviations: Step-through pattern;Decreased stride length     General Gait Details: pt used HHA and IV pole for support (RW too short for pt to use), assist to steady occasional  Stairs            Wheelchair Mobility    Modified Rankin (Stroke Patients Only)       Balance                                             Pertinent Vitals/Pain Pain Assessment: 0-10 Pain Score: 5  Pain Location: abdomen Pain Descriptors /  Indicators: Aching Pain Intervention(s): Limited activity within patient's tolerance;Repositioned;RN gave pain meds during session;Monitored during session (RN into room end of session and aware of pain)    Home Living Family/patient expects to be discharged to:: Skilled nursing facility Living Arrangements: Spouse/significant other;Children               Additional Comments: pt reports being at SNF while getting treatment for cancer however was home prior to this    Prior Function Level of Independence: Independent with assistive device(s)         Comments: states he uses RW for mobility at SNF however reports Independent with ADLs     Hand Dominance        Extremity/Trunk Assessment               Lower Extremity Assessment: Overall WFL for tasks assessed         Communication   Communication: No difficulties  Cognition Arousal/Alertness: Awake/alert Behavior During Therapy: WFL for tasks assessed/performed Overall Cognitive Status: Within Functional Limits for tasks assessed                      General Comments      Exercises        Assessment/Plan    PT Assessment Patient needs continued PT services  PT Diagnosis Difficulty walking   PT Problem List Decreased strength;Decreased mobility;Decreased balance;Decreased knowledge of use of DME  PT Treatment Interventions DME instruction;Gait training;Functional mobility training;Therapeutic activities;Therapeutic exercise;Patient/family education;Balance training   PT Goals (Current goals can be found in the Care Plan section) Acute Rehab PT Goals PT Goal Formulation: With patient Time For Goal Achievement: 07/19/14 Potential to Achieve Goals: Good    Frequency Min 3X/week   Barriers to discharge        Co-evaluation               End of Session   Activity Tolerance: Patient tolerated treatment well Patient left: in chair;with call bell/phone within reach;with chair alarm  set;with nursing/sitter in room           Time: 8921-1941 PT Time Calculation (min) (ACUTE ONLY): 16 min   Charges:   PT Evaluation $Initial PT Evaluation Tier I: 1 Procedure PT Treatments $Gait Training: 8-22 mins   PT G Codes:          Shunte Senseney,KATHrine E 07/05/2014, 12:14 PM Carmelia Bake, PT, DPT 07/05/2014 Pager: [phone]

## 2014-07-05 NOTE — Progress Notes (Signed)
Mr. [name] [name] actually looks pretty good. To me, he seems to be at his baseline. He is afebrile. So far, all cultures have been negative.  He did have a chest x-ray done yesterday. The chest x-ray showed some vascular congestion. There is no pneumonia or infiltrate suggestive of infection.  His lab work looks pretty good. His white cell count 4.5. Hemoglobin 10.5 and platelet count 203. His albumin is 3.0. BUN is 17 creatinine 1.04.  He is eating okay. He's having no nausea or vomiting. He's having no problem with diarrhea. He seems to be urinating okay.  On physical exam, all as final signs look stable. Temperature 97.8. Pulse 57. Blood pressure 110/58. Head and neck exam shows no ocular or oral lesion. He has no palpable cervical or supraclavicular left [place]. There is no mucus size. Lungs are clear. Cardiac exam regular rate and rhythm. Abdomen soft. Has good bowel sounds. There is no fluid wave. There is no palpable liver or spleen tip. Extremities shows no clubbing, cyanosis or edema. Neurological exam is nonfocal.  I am not sure exactly what happened with him. He had a high temperature. He may have been septic. Again, all cultures have been negative.  He is off his Xeloda for right now. I told him that it will be okay for him to be off the Xeloda until we make sure that there is no underlying infection that he is dealing with.  I appreciate all the great care that he is getting from the hospitalist and the staff on [place] 12:10

## 2014-07-05 NOTE — Clinical Documentation Improvement (Signed)
  Consult Note 12/16 states "I still think there is some element of Sepsis and infection that caused his change in clinical status". If you agree with this assessment please add the diagnoses to your  PN for clarification.  Possible Conditions -- Sepsis with UTI -- UTI with fever -- Fever of unknown origin   Thank you,  Ezekiel Ina ,RN Clinical Documentation Specialist:  [phone]  [facility] Information Management

## 2014-07-05 NOTE — Progress Notes (Signed)
[facility] PROGRESS NOTE  [name] [name] AQT:[id number] DOB: March 18, 1923 DOA: 07/02/2014 PCP: Drema Pry, DO  Assessment/Plan: #1 fever Unknown etiology. Chest x-ray was negative. Urinalysis is negative. Blood cultures were no growth to date. Currently afebrile. Change IV vancomycin and IV Zosyn to oral levaquin. Follow.  #2 acute encephalopathy Likely secondary to prone #1. Source of fever unknown. Patient has been pancultured cultures negative to date. Patient has improved clinically. Change IV antibiotics to oral levaquin.   #3 pancreatic cancer with liver metastases Per oncology.  #4 history of DVT Eliquis  Code Status: Partial Family Communication: Updated patient of family at bedside. Disposition Plan: SNF when medically stable, hopefully tomorrow.   Consultants:  Oncology:Dr Ennever 07/04/2014  Procedures:  Chest x-ray 07/02/2014, 07/04/14  Antibiotics:  IV vancomycin 07/02/2014>>>>07/05/14  IV Zosyn 07/02/2014>>>>07/05/14  Oral levaquin 07/05/14  HPI/Subjective: Patient states feeling better. Cough improved.   Objective: Filed Vitals:   07/05/14 1317  BP: 99/44  Pulse: 60  Temp: 97.6 F (36.4 C)  Resp: 18    Intake/Output Summary (Last 24 hours) at 07/05/14 1532 Last data filed at 07/05/14 1300  Gross per 24 hour  Intake    960 ml  Output   3450 ml  Net  -2490 ml   Filed Weights   07/03/14 0523 07/04/14 0514 07/05/14 0639  Weight: 76.159 kg (167 lb 14.4 oz) 75.751 kg (167 lb) 73.392 kg (161 lb 12.8 oz)    Exam:   General:  NAD  Cardiovascular: RRR  Respiratory: CTAB.   Abdomen: Soft, nontender, nondistended, positive bowel sounds.  Musculoskeletal: No clubbing cyanosis or edema.  Data Reviewed: Basic Metabolic Panel:  Recent Labs Lab 07/02/14 1643 07/03/14 0457 07/04/14 0504 07/05/14 0420  NA 134* 137 138 138  K 4.7 4.1 4.2 4.0  CL 97 101 101 96  CO2 23 25 28 29   GLUCOSE 140* 126* 148* 137*  BUN 23 21 19 17    CREATININE 0.90 0.91 0.97 1.04  CALCIUM 9.4 9.2 9.2 9.7   Liver Function Tests:  Recent Labs Lab 07/02/14 1643 07/05/14 0420  AST 187* 30  ALT 138* 51  ALKPHOS 413* 270*  BILITOT 0.7 0.4  PROT 7.0 6.9  ALBUMIN 3.2* 3.0*    Recent Labs Lab 07/02/14 1845  LIPASE 7*   No results for input(s): AMMONIA in the last 168 hours. CBC:  Recent Labs Lab 07/02/14 1643 07/03/14 0457 07/04/14 0504 07/05/14 0420  WBC 4.9 4.2 4.9 4.5  NEUTROABS 4.0  --   --   --   HGB 10.2* 9.6* 10.1* 10.5*  HCT 31.0* 29.5* 30.8* 32.2*  MCV 99.0 99.3 99.7 101.3*  PLT 154 138* 168 203   Cardiac Enzymes:  Recent Labs Lab 07/02/14 1845  TROPONINI <0.30   BNP (last 3 results) No results for input(s): PROBNP in the last 8760 hours. CBG: No results for input(s): GLUCAP in the last 168 hours.  Recent Results (from the past 240 hour(s))  Culture, blood (routine x 2)     Status: None (Preliminary result)   Collection Time: 07/02/14  4:43 PM  Result Value Ref Range Status   Specimen Description BLOOD BLOOD LEFT FOREARM  Final   Special Requests BOTTLES DRAWN AEROBIC AND ANAEROBIC 5ML  Final   Culture  Setup Time   Final    07/02/2014 20:43 Performed at [facility]    Culture   Final           BLOOD CULTURE RECEIVED NO GROWTH TO DATE  CULTURE WILL BE HELD FOR 5 DAYS BEFORE ISSUING A FINAL NEGATIVE REPORT Performed at [facility]    Report Status PENDING  Incomplete  Culture, blood (routine x 2)     Status: None (Preliminary result)   Collection Time: 07/02/14  5:07 PM  Result Value Ref Range Status   Specimen Description BLOOD BLOOD RIGHT FOREARM  Final   Special Requests BOTTLES DRAWN AEROBIC AND ANAEROBIC 3ML  Final   Culture  Setup Time   Final    07/02/2014 20:43 Performed at [facility]    Culture   Final           BLOOD CULTURE RECEIVED NO GROWTH TO DATE CULTURE WILL BE HELD FOR 5 DAYS BEFORE ISSUING A FINAL NEGATIVE REPORT Performed at [facility]    Report Status PENDING  Incomplete  Urine culture     Status: None   Collection Time: 07/02/14  5:10 PM  Result Value Ref Range Status   Specimen Description URINE, CLEAN CATCH  Final   Special Requests NONE  Final   Culture  Setup Time   Final    07/02/2014 20:54 Performed at [facility] Performed at [facility]   Final   Culture NO GROWTH Performed at [facility]   Final   Report Status 07/03/2014 FINAL  Final  MRSA PCR Screening     Status: None   Collection Time: 07/02/14 10:04 PM  Result Value Ref Range Status   MRSA by PCR NEGATIVE NEGATIVE Final    Comment:        The GeneXpert MRSA Assay (FDA approved for NASAL specimens only), is one component of a comprehensive MRSA colonization surveillance program. It is not intended to diagnose MRSA infection nor to guide or monitor treatment for MRSA infections.      Studies: Dg Chest Port 1 View  07/04/2014   CLINICAL DATA:  Productive cough, prostate cancer, hypertension, type 2 diabetes, bladder cancer, coronary artery disease, chronic systolic heart failure  EXAM: PORTABLE CHEST - 1 VIEW  COMPARISON:  Portable exam 1445 hr compared to 07/02/2014  FINDINGS: RIGHT jugular Port-A-Cath with tip projecting over distal SVC.  Enlargement of cardiac silhouette.  Mildly tortuous aorta with atherosclerotic calcification.  Pulmonary vascular congestion.  Bibasilar atelectasis.  No infiltrate, pleural effusion or pneumothorax.  IMPRESSION: Enlargement of cardiac silhouette with slight pulmonary vascular congestion.  Bibasilar atelectasis.   Electronically Signed   By: Lavonia Dana M.D.   On: 07/04/2014 16:11    Scheduled Meds: . apixaban  2.5 mg Oral BID  . carvedilol  12.5 mg Oral BID WC  . docusate sodium  100 mg Oral BID  . lactose free nutrition  237 mL Oral Q1500  . piperacillin-tazobactam (ZOSYN)  IV  3.375 g Intravenous Q8H  . pravastatin  40 mg Oral QPM  .  pregabalin  75 mg Oral q morning - 10a  . sodium chloride  3 mL Intravenous Q12H  . sodium chloride  3 mL Intravenous Q12H  . temazepam  15 mg Oral QHS  . vancomycin  750 mg Intravenous Q12H   Continuous Infusions:   Principal Problem:   Acute delirium Active Problems:   Coronary atherosclerosis   SYSTOLIC HEART FAILURE, CHRONIC   Liver metastasis   Pancreatic cancer   Fever presenting with conditions classified elsewhere   Protein-calorie malnutrition, severe    Time spent: 35 minutes    Vianne Grieshop M.D.  [facility] Pager [phone]. If 7PM-7AM, please contact night-coverage at [email or web address], password [facility] 07/05/2014, 3:32 PM  LOS: 3 days

## 2014-07-06 ENCOUNTER — Ambulatory Visit: Payer: Medicare Other | Admitting: Hematology & Oncology

## 2014-07-06 ENCOUNTER — Other Ambulatory Visit: Payer: Medicare Other | Admitting: Lab

## 2014-07-06 DIAGNOSIS — R509 Fever, unspecified: Secondary | ICD-10-CM

## 2014-07-06 DIAGNOSIS — I82409 Acute embolism and thrombosis of unspecified deep veins of unspecified lower extremity: Secondary | ICD-10-CM | POA: Insufficient documentation

## 2014-07-06 DIAGNOSIS — G9340 Encephalopathy, unspecified: Secondary | ICD-10-CM

## 2014-07-06 LAB — CBC
HEMATOCRIT: 31.6 % — AB (ref 39.0–52.0)
Hemoglobin: 10.1 g/dL — ABNORMAL LOW (ref 13.0–17.0)
MCH: 31.9 pg (ref 26.0–34.0)
MCHC: 32 g/dL (ref 30.0–36.0)
MCV: 99.7 fL (ref 78.0–100.0)
Platelets: 185 10*3/uL (ref 150–400)
RBC: 3.17 MIL/uL — ABNORMAL LOW (ref 4.22–5.81)
RDW: 17.4 % — AB (ref 11.5–15.5)
WBC: 4.4 10*3/uL (ref 4.0–10.5)

## 2014-07-06 LAB — BASIC METABOLIC PANEL
ANION GAP: 11 (ref 5–15)
BUN: 23 mg/dL (ref 6–23)
CO2: 28 mEq/L (ref 19–32)
Calcium: 9.7 mg/dL (ref 8.4–10.5)
Chloride: 99 mEq/L (ref 96–112)
Creatinine, Ser: 1.02 mg/dL (ref 0.50–1.35)
GFR calc non Af Amer: 63 mL/min — ABNORMAL LOW (ref >90)
GFR, EST AFRICAN AMERICAN: 73 mL/min — AB (ref >90)
Glucose, Bld: 138 mg/dL — ABNORMAL HIGH (ref 70–99)
POTASSIUM: 4.2 meq/L (ref 3.7–5.3)
Sodium: 138 mEq/L (ref 137–147)

## 2014-07-06 MED ORDER — LEVOFLOXACIN 750 MG PO TABS: 750.0000 mg | ORAL_TABLET | Freq: Every day | ORAL | Status: DC

## 2014-07-06 MED ORDER — CAPECITABINE 500 MG PO TABS
1500.0000 mg | ORAL_TABLET | Freq: Two times a day (BID) | ORAL | Status: DC
Start: 2014-07-10 — End: 2014-07-06

## 2014-07-06 MED ORDER — GLUCERNA SHAKE PO LIQD: 237.0000 mL | Freq: Two times a day (BID) | ORAL | Status: AC | PRN

## 2014-07-06 MED ORDER — HYDROCODONE-ACETAMINOPHEN 10-325 MG PO TABS: ORAL_TABLET | ORAL | Status: DC

## 2014-07-06 MED ORDER — LORAZEPAM 0.5 MG PO TABS: 1.0000 mg | ORAL_TABLET | Freq: Four times a day (QID) | ORAL | Status: AC | PRN

## 2014-07-06 MED ORDER — CAPECITABINE 500 MG PO TABS: 1500.0000 mg | ORAL_TABLET | Freq: Two times a day (BID) | ORAL | Status: AC

## 2014-07-06 NOTE — Plan of Care (Signed)
Problem: Phase I Progression Outcomes Goal: Hemodynamically stable Outcome: Progressing BPs soft

## 2014-07-06 NOTE — Progress Notes (Signed)
Pt discharged to [facility]. DC packet given to Dot, male that accompanied/transported patient to nursing home. Report called and given to [facility], lpn at [facility]. All questions answered to nurses's satisfaction. Pt left unit in wheelchair pushed by nurse tech. Left in good condition. Charity, lpn at facility made aware of heart failure booklet and importance of same. Voiced understanding and said will monitor pt. Vwilliams,rn.

## 2014-07-06 NOTE — Discharge Summary (Signed)
Physician Discharge Summary  [name] [name] NOI:[id number] DOB: Aug 18, 1922 DOA: 07/02/2014  PCP: Drema Pry, DO  Admit date: 07/02/2014 Discharge date: 07/06/2014  Time spent: 65 minutes  Recommendations for Outpatient Follow-up:  1. Follow-up with Drema Pry, DO in 1 week. On follow-up patient in need a basic metabolic profile done to follow-up on his electrolytes and renal function. 2. Follow-up with Dr. Marin Olp as scheduled.  Discharge Diagnoses:  Principal Problem:   FUO (fever of unknown origin) Active Problems:   Coronary atherosclerosis   SYSTOLIC HEART FAILURE, CHRONIC   Liver metastasis   Pancreatic cancer   Acute encephalopathy   Protein-calorie malnutrition, severe   Discharge Condition: Stable and improved  Diet recommendation: Regular  Filed Weights   07/04/14 0514 07/05/14 0639 07/06/14 0456  Weight: 75.751 kg (167 lb) 73.392 kg (161 lb 12.8 oz) 73.936 kg (163 lb)    History of present illness:  80 yo male h/o stage 4 pancreatic cancer  Who came in with high fever up to 104 at snf. Pt denied any cough, no n/v/d. No pain different than what he has associated with his cancer. No sob. No swelling. pts hcpoa was present and said he turns the heat up in his room to 86 and its always hot in there. He is still getting chemo, and he had developed another lesion on his liver in the last couple of weeks. His confusion was better now and appeared well. Says he did have some dysuria the past couple of days prior to admission. Per admitting physician.  Hospital Course:  #1 fever of unknown origin Patient had presented with a fever of 104 noted at the skilled nursing facility prior to admission. Patient did have a history of metastatic pancreatic cancer was subsequently admitted. Patient was subsequently pancultured and placed empirically on IV vancomycin and IV Zosyn on admission. Urinalysis which was done was negative. Chest x-ray which was done was negative for any  acute infiltrate. Blood cultures were obtained with no growth to date. Patient improved clinically remained afebrile throughout the hospitalization and was subsequently transitioned to oral Levaquin. Patient will be discharged on 3 more days of oral Levaquin to complete a one-week course of antibiotic therapy. Patient will follow-up with PCP and [facility] skilled nursing facility.  #2 acute encephalopathy Patient was noted to present with an acute encephalopathy felt to be likely secondary to problem #1. Patient was pancultured and no source of infection was identified. Chest x-ray which was done was negative. Patient had been placed empirically on IV vancomycin and IV Zosyn. Patient improved clinically and was at his baseline, during the hospitalization. Patient was subsequently transitioned to oral Levaquin and will finish a course as outpatient. Patient was administered by day of discharge.  #3 pancreatic cancer with liver metastases Patient was known to have a history of pancreatic cancer with liver metastases. Patient was seen in consultation by Dr. Marin Olp for oncology and it was recommended that patient resume his dose of xeloda. Patient will follow-up with oncology as outpatient.  #4 history of DVT Patient was maintained on his home regimen of her liquids.  #5 malnutrition Patient was placed on nutrition supplementation.  Procedures:  Chest x-ray 07/02/2014, 07/04/14  Consultations:  Oncology:Dr Ennever 07/04/2014  Discharge Exam: Filed Vitals:   07/06/14 1400  BP: 133/51  Pulse: 64  Temp: 98 F (36.7 C)  Resp: 16    General: NAD Cardiovascular: RRR Respiratory: CTAB  Discharge Instructions You were cared for by a hospitalist during your  hospital stay. If you have any questions about your discharge medications or the care you received while you were in the hospital after you are discharged, you can call the unit and asked to speak with the hospitalist on call if the hospitalist  that took care of you is not available. Once you are discharged, your primary care physician will handle any further medical issues. Please note that NO REFILLS for any discharge medications will be authorized once you are discharged, as it is imperative that you return to your primary care physician (or establish a relationship with a primary care physician if you do not have one) for your aftercare needs so that they can reassess your need for medications and monitor your lab values.  Discharge Instructions    Diet general    Complete by:  As directed      Discharge instructions    Complete by:  As directed   Follow up with MD at SNF. Follow up with Dr Marin Olp as scheduled.     Increase activity slowly    Complete by:  As directed           Current Discharge Medication List    START taking these medications   Details  levofloxacin (LEVAQUIN) 750 MG tablet Take 1 tablet (750 mg total) by mouth daily. Take for 3 days then stop. Qty: 3 tablet, Refills: 0      CONTINUE these medications which have CHANGED   Details  capecitabine (XELODA) 500 MG tablet Take 3 tablets (1,500 mg total) by mouth 2 (two) times daily after a meal. TAKES 3 TABS (1500) MG BID  ///  ON 14 DAYS OFF 7 DAYS FOR 3 CYCLES.   STARTED 06/06/14    !! feeding supplement, GLUCERNA SHAKE, (GLUCERNA SHAKE) LIQD Take 237 mLs by mouth 2 (two) times daily between meals as needed (If pt with <50% meal completion). Refills: 0    HYDROcodone-acetaminophen (NORCO) 10-325 MG per tablet Take one tablet by mouth every 4 hours as needed for pain Qty: 20 tablet, Refills: 0    LORazepam (ATIVAN) 0.5 MG tablet Take 2 tablets (1 mg total) by mouth every 6 (six) hours as needed (nausea/vomiting). Qty: 15 tablet, Refills: 0     !! - Potential duplicate medications found. Please discuss with provider.    CONTINUE these medications which have NOT CHANGED   Details  apixaban (ELIQUIS) 2.5 MG TABS tablet Take 2.5 mg by mouth 2 (two)  times daily.    bisacodyl (DULCOLAX) 10 MG suppository Place 1 suppository (10 mg total) rectally daily as needed for moderate constipation. Qty: 12 suppository, Refills: 0    carvedilol (COREG) 12.5 MG tablet Take 12.5 mg by mouth 2 (two) times daily with a meal.    docusate sodium 100 MG CAPS Take 100 mg by mouth 2 (two) times daily. Qty: 60 capsule, Refills: 0    insulin glargine (LANTUS) 100 UNIT/ML injection Inject 3 Units into the skin at bedtime.    !! lactose free nutrition (BOOST) LIQD Take 237 mLs by mouth daily at 3 pm.    lidocaine-prilocaine (EMLA) cream Apply 1 application topically as needed. Qty: 30 g, Refills: 0   Associated Diagnoses: Malignant neoplasm of other specified sites of pancreas    Omega-3 Fatty Acids (FISH OIL) 1000 MG CAPS Take 1,000 mg by mouth every morning.    Associated Diagnoses: Anemia, unspecified anemia type; Anemia of chronic disease; Metastatic cancer; Malignant neoplasm of urinary bladder, unspecified site; Liver metastasis; Malignant  neoplasm of pancreas, unspecified location of malignancy    ondansetron (ZOFRAN) 8 MG tablet Take by mouth 2 (two) times daily as needed for nausea or vomiting.     polyethylene glycol (MIRALAX / GLYCOLAX) packet Take 17 g by mouth 2 (two) times daily as needed for mild constipation.    pravastatin (PRAVACHOL) 40 MG tablet Take 40 mg by mouth every evening.     pregabalin (LYRICA) 75 MG capsule Take 75 mg by mouth every morning.     prochlorperazine (COMPAZINE) 5 MG tablet Take 5 mg by mouth every 6 (six) hours as needed for nausea or vomiting.    ranitidine (ZANTAC) 150 MG tablet Take 150 mg by mouth 2 (two) times daily.    temazepam (RESTORIL) 15 MG capsule Take 15 mg by mouth at bedtime.     nitroGLYCERIN (NITROSTAT) 0.4 MG SL tablet Place 1 tablet (0.4 mg total) under the tongue every 5 (five) minutes as needed for chest pain. Qty: 90 tablet, Refills: 3   Associated Diagnoses: Chest pain     !! -  Potential duplicate medications found. Please discuss with provider.     No Known Allergies Follow-up Information    Follow up with Drema Pry, DO. Schedule an appointment as soon as possible for a visit in 1 week.   Specialty:  Internal Medicine   Contact information:   [address] [address] [phone]       Please follow up.   Why:  f/u with MD at SNF      Follow up with Volanda Napoleon, MD.   Specialty:  Oncology   Why:  F/U AS SCHEDULED.   Contact information:   [address], [address] [place] [address] [phone]        The results of significant diagnostics from this hospitalization (including imaging, microbiology, ancillary and laboratory) are listed below for reference.    Significant Diagnostic Studies: Dg Chest 2 View  07/02/2014   CLINICAL DATA:  Fever and altered level of consciousness.  EXAM: CHEST  2 VIEW  COMPARISON:  PA and lateral chest 03/30/2014 03/01/2014 and 09/22/2012.  FINDINGS: Port-A-Cath is in place. Mild streaky opacity in the left lung base is most consistent with atelectasis or scar. No consolidative process, pneumothorax or effusion is identified. Heart size is upper normal.  IMPRESSION: No acute abnormality.   Electronically Signed   By: Inge Rise M.D.   On: 07/02/2014 18:20   Dg Chest Port 1 View  07/04/2014   CLINICAL DATA:  Productive cough, prostate cancer, hypertension, type 2 diabetes, bladder cancer, coronary artery disease, chronic systolic heart failure  EXAM: PORTABLE CHEST - 1 VIEW  COMPARISON:  Portable exam 1445 hr compared to 07/02/2014  FINDINGS: RIGHT jugular Port-A-Cath with tip projecting over distal SVC.  Enlargement of cardiac silhouette.  Mildly tortuous aorta with atherosclerotic calcification.  Pulmonary vascular congestion.  Bibasilar atelectasis.  No infiltrate, pleural effusion or pneumothorax.  IMPRESSION: Enlargement of cardiac silhouette with slight pulmonary  vascular congestion.  Bibasilar atelectasis.   Electronically Signed   By: Lavonia Dana M.D.   On: 07/04/2014 16:11    Microbiology: Recent Results (from the past 240 hour(s))  Culture, blood (routine x 2)     Status: None (Preliminary result)   Collection Time: 07/02/14  4:43 PM  Result Value Ref Range Status   Specimen Description BLOOD BLOOD LEFT FOREARM  Final   Special Requests BOTTLES DRAWN AEROBIC AND ANAEROBIC  5ML  Final   Culture  Setup Time   Final    07/02/2014 20:43 Performed at [facility]    Culture   Final           BLOOD CULTURE RECEIVED NO GROWTH TO DATE CULTURE WILL BE HELD FOR 5 DAYS BEFORE ISSUING A FINAL NEGATIVE REPORT Performed at [facility]    Report Status PENDING  Incomplete  Culture, blood (routine x 2)     Status: None (Preliminary result)   Collection Time: 07/02/14  5:07 PM  Result Value Ref Range Status   Specimen Description BLOOD BLOOD RIGHT FOREARM  Final   Special Requests BOTTLES DRAWN AEROBIC AND ANAEROBIC 3ML  Final   Culture  Setup Time   Final    07/02/2014 20:43 Performed at [facility]    Culture   Final           BLOOD CULTURE RECEIVED NO GROWTH TO DATE CULTURE WILL BE HELD FOR 5 DAYS BEFORE ISSUING A FINAL NEGATIVE REPORT Performed at [facility]    Report Status PENDING  Incomplete  Urine culture     Status: None   Collection Time: 07/02/14  5:10 PM  Result Value Ref Range Status   Specimen Description URINE, CLEAN CATCH  Final   Special Requests NONE  Final   Culture  Setup Time   Final    07/02/2014 20:54 Performed at [facility] Performed at [facility]   Final   Culture NO GROWTH Performed at [facility]   Final   Report Status 07/03/2014 FINAL  Final  MRSA PCR Screening     Status: None   Collection Time: 07/02/14 10:04 PM  Result Value Ref Range Status   MRSA by PCR NEGATIVE NEGATIVE Final    Comment:        The GeneXpert  MRSA Assay (FDA approved for NASAL specimens only), is one component of a comprehensive MRSA colonization surveillance program. It is not intended to diagnose MRSA infection nor to guide or monitor treatment for MRSA infections.      Labs: Basic Metabolic Panel:  Recent Labs Lab 07/02/14 1643 07/03/14 0457 07/04/14 0504 07/05/14 0420 07/06/14 0500  NA 134* 137 138 138 138  K 4.7 4.1 4.2 4.0 4.2  CL 97 101 101 96 99  CO2 23 25 28 29 28   GLUCOSE 140* 126* 148* 137* 138*  BUN 23 21 19 17 23   CREATININE 0.90 0.91 0.97 1.04 1.02  CALCIUM 9.4 9.2 9.2 9.7 9.7   Liver Function Tests:  Recent Labs Lab 07/02/14 1643 07/05/14 0420  AST 187* 30  ALT 138* 51  ALKPHOS 413* 270*  BILITOT 0.7 0.4  PROT 7.0 6.9  ALBUMIN 3.2* 3.0*    Recent Labs Lab 07/02/14 1845  LIPASE 7*   No results for input(s): AMMONIA in the last 168 hours. CBC:  Recent Labs Lab 07/02/14 1643 07/03/14 0457 07/04/14 0504 07/05/14 0420 07/06/14 0500  WBC 4.9 4.2 4.9 4.5 4.4  NEUTROABS 4.0  --   --   --   --   HGB 10.2* 9.6* 10.1* 10.5* 10.1*  HCT 31.0* 29.5* 30.8* 32.2* 31.6*  MCV 99.0 99.3 99.7 101.3* 99.7  PLT 154 138* 168 203 185   Cardiac Enzymes:  Recent Labs Lab 07/02/14 1845  TROPONINI <0.30   BNP: BNP (last 3 results) No results for input(s): PROBNP in the last 8760 hours. CBG:  No results for input(s): GLUCAP in the last 168 hours.     SignedIrine Seal MD [facility] 07/06/2014, 2:18 PM

## 2014-07-06 NOTE — Progress Notes (Signed)
Patient is set to discharge back to [name] SN[facility] today. Patient &  Family at bedside aware. Discharge packet given to RN, Vanita Ingles. Patient informed CSW that he will call his friend and have her pick him up. RN confirmed that she spoke with friend that she would be able to transport him.    [name] [name], [facility] Clinical Social Worker cell #: [phone]

## 2014-07-06 NOTE — Progress Notes (Signed)
Heart failure education provided to patient. Pt already had a booklet at bedside but denied being educated. Will communicate details with nurse at facility upon [facility]. Vwilliams, rn.

## 2014-07-08 LAB — CULTURE, BLOOD (ROUTINE X 2)
Culture: NO GROWTH
Culture: NO GROWTH

## 2014-07-09 ENCOUNTER — Encounter: Payer: Self-pay | Admitting: Nurse Practitioner

## 2014-07-09 ENCOUNTER — Non-Acute Institutional Stay (SKILLED_NURSING_FACILITY): Payer: Medicare Other | Admitting: Nurse Practitioner

## 2014-07-09 DIAGNOSIS — R509 Fever, unspecified: Secondary | ICD-10-CM

## 2014-07-09 DIAGNOSIS — C250 Malignant neoplasm of head of pancreas: Secondary | ICD-10-CM

## 2014-07-09 DIAGNOSIS — G4700 Insomnia, unspecified: Secondary | ICD-10-CM

## 2014-07-09 DIAGNOSIS — K5900 Constipation, unspecified: Secondary | ICD-10-CM

## 2014-07-09 DIAGNOSIS — G609 Hereditary and idiopathic neuropathy, unspecified: Secondary | ICD-10-CM

## 2014-07-09 DIAGNOSIS — E1142 Type 2 diabetes mellitus with diabetic polyneuropathy: Secondary | ICD-10-CM

## 2014-07-09 DIAGNOSIS — G9340 Encephalopathy, unspecified: Secondary | ICD-10-CM

## 2014-07-09 DIAGNOSIS — E43 Unspecified severe protein-calorie malnutrition: Secondary | ICD-10-CM

## 2014-07-09 DIAGNOSIS — I82402 Acute embolism and thrombosis of unspecified deep veins of left lower extremity: Secondary | ICD-10-CM

## 2014-07-09 DIAGNOSIS — K219 Gastro-esophageal reflux disease without esophagitis: Secondary | ICD-10-CM

## 2014-07-09 DIAGNOSIS — I10 Essential (primary) hypertension: Secondary | ICD-10-CM

## 2014-07-09 NOTE — Assessment & Plan Note (Signed)
Blood pressure controlled on Coreg 12.5mg  bid.

## 2014-07-09 NOTE — Progress Notes (Signed)
Patient ID: [name] [name], male   DOB: 02/22/1923, 80 y.o.   MRN: [id number]   Code Status: Full code  No Known Allergies  Chief Complaint  Patient presents with  . Medical Management of Chronic Issues  . Acute Visit    HPI: Patient is a 80 y.o. male seen in the SNF at [facility] today for evaluation of chronic medical conditions.    Hospitalized 07/02/14-07/06/14 for fever 104-resolved once ABT started. Negative urine culture, CXR, and blood culture.     Hospitalized 03/29/2014 -04/04/2014 for pancreatic mass, FTT, CHF. He was admitted 03/29/14 with chief complaint of malaise, decreased appetite, generalized weakness and hematuria. A CT scan done on admission showed a 4.9 cm enhancing lesion adjacent to the pancreatic head/uncinate process worrisome for a primary pancreatic adenocarcinoma and suspected multifocal hepatic metastasis. This mass was noted back in June when his urologist ordered a CT scan of his abdomen/pelvis, and he was referred to GI as an outpatient for further evaluation. An EUS with biopsy was planned however the patient did not hold his Coumadin as instructed and the biopsy was canceled. The mass was not detectable on the ultrasound. Further evaluation was subsequently halted given the patient's overall functional status and comorbidities. His wife died 3 weeks ago.    Problem List Items Addressed This Visit    Type 2 diabetes mellitus    12/20/13 Hgb A1c 7.3 04/09/14 CBG am 150-180, pm 186/235-adding Lantus 3 units qd qhs.  05/28/14 update Hgb A1c 7.1 06/27/14 continue Lantus 3 units daily.     Protein-calorie malnutrition, severe    05/01/14 albumin 2.8. Malignancy is contributory. Dietary supplement.       Pancreatic cancer    Patient was known to have a history of pancreatic cancer with liver metastases. Patient was seen in consultation by Dr. Marin Olp for oncology and it was recommended that patient resume his dose of xeloda. Patient will follow-up with  oncology as outpatient.     Insomnia    Takes Temazepam 15mg  qhs prn for sleep.        HYPERTENSION, BENIGN    Blood pressure controlled on Coreg 12.5mg  bid.       Hereditary and idiopathic peripheral neuropathy    Takes Pregabalin 75mg  daily. Ambulates with walker on unit      GERD    Takes ranitidine 150mg  bid.      FUO (fever of unknown origin) - Primary    Patient had presented with a fever of 104 noted at the skilled nursing facility prior to admission. Patient did have a history of metastatic pancreatic cancer was subsequently admitted. Patient was subsequently pancultured and placed empirically on IV vancomycin and IV Zosyn on admission. Urinalysis which was done was negative. Chest x-ray which was done was negative for any acute infiltrate. Blood cultures were obtained with no growth to date. Patient improved clinically remained afebrile throughout the hospitalization and was subsequently transitioned to oral Levaquin. Patient will be discharged on 3 more days of oral Levaquin to complete a one-week course of antibiotic therapy. Patient will follow-up with PCP and [facility] skilled nursing facility.     DVT (deep venous thrombosis)    05/04/14 Doppler LLE: DVT at left femoral and popliteal veins not completely occlusive. Will anticoagulate with Eliquis 2.5mg  bid.      Constipation    Adequately managed with MiraLax bid prn, Enulose 15mg  bid prn, Colace 100mg  bid, Bisacodyl 10mg  suppository qd prn.     Acute encephalopathy  likely secondary to fever. Patient was pancultured and no source of infection was identified. Chest x-ray which was done was negative. Patient had been placed empirically on IV vancomycin and IV Zosyn. Patient improved clinically and was at his baseline, during the hospitalization. Patient was subsequently transitioned to oral Levaquin and will finish a course as outpatient.         Review of Systems:  Review of Systems  Constitutional: Positive for  fever and weight loss. Negative for chills, malaise/fatigue and diaphoresis.       About #15 Ibs in the past year. Generalized weakness.  HENT: Negative for congestion, ear discharge, ear pain, hearing loss, nosebleeds, sore throat and tinnitus.   Eyes: Negative for blurred vision, double vision, photophobia, pain, discharge and redness.  Respiratory: Negative for cough, hemoptysis, sputum production, shortness of breath, wheezing and stridor.   Cardiovascular: Positive for leg swelling. Negative for chest pain, palpitations, orthopnea, claudication and PND.       New LLE  Gastrointestinal: Positive for abdominal pain. Negative for heartburn, nausea, vomiting, diarrhea, constipation, blood in stool and melena.       Epigastric area dull pain. He said it has been hurting for a long time  Genitourinary: Positive for frequency. Negative for dysuria, urgency, hematuria and flank pain.  Musculoskeletal: Negative for myalgias, back pain, joint pain, falls and neck pain.  Skin: Negative for itching and rash.       Port A Cath Intergluteal cleft redness.   Neurological: Positive for weakness. Negative for dizziness, tingling, tremors, sensory change, speech change, focal weakness, seizures, loss of consciousness and headaches.  Endo/Heme/Allergies: Negative for environmental allergies and polydipsia. Does not bruise/bleed easily.  Psychiatric/Behavioral: Negative for depression, suicidal ideas, hallucinations, memory loss and substance abuse. The patient is not nervous/anxious and does not have insomnia.      Past Medical History  Diagnosis Date  . Diverticulosis of colon (without mention of hemorrhage)   . Irritable bowel syndrome   . Personal history of malignant neoplasm of prostate     s/p  radioactive seed implants 2000  . Unspecified hereditary and idiopathic peripheral neuropathy   . Nonischemic cardiomyopathy cardiologist-  dr Aundra Dubin    EF 25%  per last echo 2012 and cardiologist note--   2007-  ef  40-45%;  2008- ef 35-40%;  2010- ef 30-35%;  2012- ef 25%  . Sinus bradycardia   . Hypertension   . Arthritis     hands  . Hyperlipidemia   . History of DVT of lower extremity     2002  ,   left leg  . History of adenomatous polyp of colon     2001  . History of basal cell carcinoma excision   . Anticoagulated on Coumadin   . Type 2 diabetes, diet controlled   . GERD (gastroesophageal reflux disease)   . LBBB (left bundle branch block)   . Wears glasses   . Wears hearing aid     BILATERAL  . Unsteady gait   . DDD (degenerative disc disease)     cervical and lumbar  . DJD (degenerative joint disease)     hips  . Lower urinary tract symptoms (LUTS)   . 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 Coronary artery disease     per cath 2002  non-obstructive cad  . Chronic systolic heart failure   . H/O fracture of  nose     12-30-2013-  pt fell at home open nose bone fx   Past Surgical History  Procedure Laterality Date  . Knee arthroscopy Left 1991  . Ercp with sphincterotomy and stone removal  05/ 1993  &  05-11-2006  . Total knee arthroplasty  10/05/2011    Procedure: TOTAL KNEE ARTHROPLASTY;  Surgeon: Gearlean Alf, MD;  Location: [facility];  Service: Orthopedics;  Laterality: Right;  . Transurethral resection of bladder tumor N/A 09/30/2012    Procedure: TRANSURETHRAL RESECTION OF BLADDER TUMOR (TURBT);  Surgeon: Franchot Gallo, MD;  Location: [facility];  Service: Urology;  Laterality: N/A;  WITH MITOMYCIN INSTILLATION   . Eus N/A 01/04/2014    Procedure: ESOPHAGEAL ENDOSCOPIC ULTRASOUND (EUS) RADIAL;  Surgeon: [name], MD;  Location: [facility];  Service: Endoscopy;  Laterality: N/A;  . Radioactive prostate seed implants  01/ 2000  . Mohs surgery  03/ 2009    LEFT EAR BASAL CELL CARCINOMA  . Tonsillectomy  AS CHILD  . Cholecystectomy open  1985  . Appendectomy  child    . Inguinal hernia repair Bilateral 1998    recurrent RIH  w/ repair 06-21-2006  . Total knee arthroplasty Left 02-21-2007    AND 04-21-2007  CLOSED MANIPULATION  . Cataract extraction w/ intraocular lens implant Right 2004  . Lumbar spine surgery  X2  . Cardiac catheterization  06-14-2001  dr Darnell Level brodie    non-obstructive CAD/  LM 30%,  pLAD 50%,  mLAD 30%,  RCA  &  CFX  irregularites ,  global hypokinesis,  ef 40%  . Cardiovascular stress test  02-07-2007   dr Darnell Level brodie    negative for ischemia,  low risk myoview perfusion study  . Transthoracic echocardiogram  01-28-2011    moderate LVH/  ef 25%/  diffuse hypokinesis/  mild LAE/  mild TR/  trivial AR & MR  . Cystoscopy w/ retrogrades Bilateral 03/09/2014    Procedure: CYSTOSCOPY WITH BILATERAL RETROGRADE PYELOGRAM BLADDER BX AND RENAL WASHINGS.;  Surgeon: Jorja Loa, MD;  Location: [facility];  Service: Urology;  Laterality: Bilateral;  . Eus N/A 04/05/2014    Procedure: UPPER ENDOSCOPIC ULTRASOUND (EUS) LINEAR;  Surgeon: [name], MD;  Location: [facility];  Service: Endoscopy;  Laterality: N/A;  radial linear  . Endoscopic retrograde cholangiopancreatography (ercp) with propofol N/A 04/12/2014    Procedure: ENDOSCOPIC RETROGRADE CHOLANGIOPANCREATOGRAPHY (ERCP) WITH PROPOFOL;  Surgeon: [name], MD;  Location: [facility];  Service: Endoscopy;  Laterality: N/A;  metal stent  . Biliary stent placement N/A 04/12/2014    Procedure: BILIARY STENT PLACEMENT;  Surgeon: [name], MD;  Location: [facility];  Service: Endoscopy;  Laterality: N/A;   Social History:   reports that he quit smoking about 31 years ago. His smoking use included Cigarettes. He started smoking about 60 years ago. He has a 10 pack-year smoking history. He has never used smokeless tobacco. He reports that he does not drink alcohol or use illicit drugs.  Family History  Problem Relation Age of Onset  . Throat cancer  Brother   . Coronary artery disease Father     deceased    Medications: Patient's Medications  New Prescriptions   No medications on file  Previous Medications   APIXABAN (ELIQUIS) 2.5 MG TABS TABLET    Take 2.5 mg by mouth 2 (two) times daily.   BISACODYL (DULCOLAX) 10 MG SUPPOSITORY    Place 1 suppository (10 mg total)  rectally daily as needed for moderate constipation.   CAPECITABINE (XELODA) 500 MG TABLET    Take 3 tablets (1,500 mg total) by mouth 2 (two) times daily after a meal. TAKES 3 TABS (1500) MG BID  ///  ON 14 DAYS OFF 7 DAYS FOR 3 CYCLES.   STARTED 06/06/14   CARVEDILOL (COREG) 12.5 MG TABLET    Take 12.5 mg by mouth 2 (two) times daily with a meal.   DOCUSATE SODIUM 100 MG CAPS    Take 100 mg by mouth 2 (two) times daily.   FEEDING SUPPLEMENT, GLUCERNA SHAKE, (GLUCERNA SHAKE) LIQD    Take 237 mLs by mouth 2 (two) times daily between meals as needed (If pt with <50% meal completion).   HYDROCODONE-ACETAMINOPHEN (NORCO) 10-325 MG PER TABLET    Take one tablet by mouth every 4 hours as needed for pain   INSULIN GLARGINE (LANTUS) 100 UNIT/ML INJECTION    Inject 3 Units into the skin at bedtime.   LACTOSE FREE NUTRITION (BOOST) LIQD    Take 237 mLs by mouth daily at 3 pm.   LEVOFLOXACIN (LEVAQUIN) 750 MG TABLET    Take 1 tablet (750 mg total) by mouth daily. Take for 3 days then stop.   LIDOCAINE-PRILOCAINE (EMLA) CREAM    Apply 1 application topically as needed.   LORAZEPAM (ATIVAN) 0.5 MG TABLET    Take 2 tablets (1 mg total) by mouth every 6 (six) hours as needed (nausea/vomiting).   NITROGLYCERIN (NITROSTAT) 0.4 MG SL TABLET    Place 1 tablet (0.4 mg total) under the tongue every 5 (five) minutes as needed for chest pain.   OMEGA-3 FATTY ACIDS (FISH OIL) 1000 MG CAPS    Take 1,000 mg by mouth every morning.    ONDANSETRON (ZOFRAN) 8 MG TABLET    Take by mouth 2 (two) times daily as needed for nausea or vomiting.    POLYETHYLENE GLYCOL (MIRALAX / GLYCOLAX) PACKET    Take 17 g  by mouth 2 (two) times daily as needed for mild constipation.   PRAVASTATIN (PRAVACHOL) 40 MG TABLET    Take 40 mg by mouth every evening.    PREGABALIN (LYRICA) 75 MG CAPSULE    Take 75 mg by mouth every morning.    PROCHLORPERAZINE (COMPAZINE) 5 MG TABLET    Take 5 mg by mouth every 6 (six) hours as needed for nausea or vomiting.   RANITIDINE (ZANTAC) 150 MG TABLET    Take 150 mg by mouth 2 (two) times daily.   TEMAZEPAM (RESTORIL) 15 MG CAPSULE    Take 15 mg by mouth at bedtime.   Modified Medications   No medications on file  Discontinued Medications   No medications on file     Physical Exam: Physical Exam  Constitutional: He is oriented to person, place, and time. He appears well-developed and well-nourished.  HENT:  Head: Normocephalic and atraumatic.  Mouth/Throat: Oropharynx is clear and moist.  Eyes: Conjunctivae and EOM are normal. Pupils are equal, round, and reactive to light. Right eye exhibits no discharge. Left eye exhibits no discharge. No scleral icterus.  Neck: Normal range of motion. Neck supple. No JVD present. No tracheal deviation present. No thyromegaly present.  Cardiovascular: Normal rate, regular rhythm and normal heart sounds.   Pulmonary/Chest: Effort normal and breath sounds normal. No stridor. He has no wheezes. He has no rales. He exhibits no tenderness.  Abdominal: Soft. Bowel sounds are normal. He exhibits no distension. There is no tenderness. There is  no rebound and no guarding.  Minimal epigastric tenderness.  No ascites  Musculoskeletal: Normal range of motion. He exhibits edema. He exhibits no tenderness.  LLE  Lymphadenopathy:    He has no cervical adenopathy.  Neurological: He is alert and oriented to person, place, and time. No cranial nerve deficit.  Skin: Skin is warm and dry. No rash noted. No erythema. No pallor.  Intergluteal cleft redness.   Psychiatric: He has a normal mood and affect. His behavior is normal.    Filed Vitals:    07/09/14 1611  BP: 130/70  Pulse: 78  Temp: 98 F (36.7 C)  TempSrc: Tympanic  Resp: 20      Labs reviewed: Basic Metabolic Panel:  Recent Labs  05/29/14  07/04/14 0504 07/05/14 0420 07/06/14 0500  NA 136*  < > 138 138 138  K 4.3  < > 4.2 4.0 4.2  CL  --   < > 101 96 99  CO2  --   < > 28 29 28   GLUCOSE  --   < > 148* 137* 138*  BUN 22*  < > 19 17 23   CREATININE 1.0  < > 0.97 1.04 1.02  CALCIUM  --   < > 9.2 9.7 9.7  TSH 1.15  --   --   --   --   < > = values in this interval not displayed. Liver Function Tests:  Recent Labs  04/10/14 0814  06/20/14 1041 07/02/14 1643 07/05/14 0420  AST 17  < > 35 187* 30  ALT 20  < > 44 138* 51  ALKPHOS 146*  < > 231* 413* 270*  BILITOT 0.5  < > 0.80 0.7 0.4  PROT 7.3  < > 7.5 7.0 6.9  ALBUMIN 4.0  --   --  3.2* 3.0*  < > = values in this interval not displayed.  Recent Labs  12/19/13 1514 03/29/14 1610 07/02/14 1845  LIPASE 8.0* 10* 7*   No results for input(s): AMMONIA in the last 8760 hours. CBC:  Recent Labs  06/06/14 1159 06/20/14 1041 07/02/14 1643  07/04/14 0504 07/05/14 0420 07/06/14 0500  WBC 5.2 4.5 4.9  < > 4.9 4.5 4.4  NEUTROABS 3.5 2.8 4.0  --   --   --   --   HGB 10.9* 10.8* 10.2*  < > 10.1* 10.5* 10.1*  HCT 33.8* 33.2* 31.0*  < > 30.8* 32.2* 31.6*  MCV 99* 100* 99.0  < > 99.7 101.3* 99.7  PLT 205 214 154  < > 168 203 185  < > = values in this interval not displayed. Lipid Panel: No results for input(s): CHOL, HDL, LDLCALC, TRIG, CHOLHDL, LDLDIRECT in the last 8760 hours.  Past Procedures:  03/29/14 CT abd and pelvis w CM:  IMPRESSION:  4.9 cm enhancing lesion adjacent to the pancreatic head/uncinate  process, worrisome for primary pancreatic adenocarcinoma,  progressed.  Tumor likely involves the SMA/SMV and undersurface of the portal  vein. 9 mm short axis node in the porta hepatitis.  Suspected multifocal hepatic metastases, including a 3.2 cm lesion  in the right hepatic  dome.   03/30/14 MR w wo CM abd:   IMPRESSION:  1. Subtle amorphous mass in the pancreatic head and proximal  pancreatic body currently measuring 4.4 x 3.2 cm, causing distal  common bile duct obstruction and demonstrating vascular involvement  (SMA, SMV, splenic vein, splenic portal confluence and proximal  portal vein), as above, highly concerning for primary pancreatic  neoplasm.  2. Although there do appear to be multiple ill-defined hepatic  lesions, concerning for metastatic disease, these were poorly  evaluated on today's examination secondary to patient respiratory  motion. Continued attention on any future followup studies is  recommended, as metastatic disease to the liver is suspected.  3. Severe intra and extrahepatic biliary ductal dilatation.   05/04/14 Doppler LLE: DVT at left femoral and popliteal veins not completely occlusive. Will anticoagulate with Eliquis 2.5mg  bid.   Assessment/Plan FUO (fever of unknown origin) Patient had presented with a fever of 104 noted at the skilled nursing facility prior to admission. Patient did have a history of metastatic pancreatic cancer was subsequently admitted. Patient was subsequently pancultured and placed empirically on IV vancomycin and IV Zosyn on admission. Urinalysis which was done was negative. Chest x-ray which was done was negative for any acute infiltrate. Blood cultures were obtained with no growth to date. Patient improved clinically remained afebrile throughout the hospitalization and was subsequently transitioned to oral Levaquin. Patient will be discharged on 3 more days of oral Levaquin to complete a one-week course of antibiotic therapy. Patient will follow-up with PCP and [facility] skilled nursing facility.   Acute encephalopathy likely secondary to fever. Patient was pancultured and no source of infection was identified. Chest x-ray which was done was negative. Patient had been placed empirically on IV vancomycin and IV  Zosyn. Patient improved clinically and was at his baseline, during the hospitalization. Patient was subsequently transitioned to oral Levaquin and will finish a course as outpatient.    Pancreatic cancer Patient was known to have a history of pancreatic cancer with liver metastases. Patient was seen in consultation by Dr. Marin Olp for oncology and it was recommended that patient resume his dose of xeloda. Patient will follow-up with oncology as outpatient.   Protein-calorie malnutrition, severe 05/01/14 albumin 2.8. Malignancy is contributory. Dietary supplement.     DVT (deep venous thrombosis) 05/04/14 Doppler LLE: DVT at left femoral and popliteal veins not completely occlusive. Will anticoagulate with Eliquis 2.5mg  bid.    HYPERTENSION, BENIGN Blood pressure controlled on Coreg 12.5mg  bid.     Constipation Adequately managed with MiraLax bid prn, Enulose 15mg  bid prn, Colace 100mg  bid, Bisacodyl 10mg  suppository qd prn.   Type 2 diabetes mellitus 12/20/13 Hgb A1c 7.3 04/09/14 CBG am 150-180, pm 186/235-adding Lantus 3 units qd qhs.  05/28/14 update Hgb A1c 7.1 06/27/14 continue Lantus 3 units daily.   GERD Takes ranitidine 150mg  bid.    Insomnia Takes Temazepam 15mg  qhs prn for sleep.      Hereditary and idiopathic peripheral neuropathy Takes Pregabalin 75mg  daily. Ambulates with walker on unit      Family/ Staff Communication: observe the patient.   Goals of Care: SNF  Labs/tests ordered: none

## 2014-07-09 NOTE — Assessment & Plan Note (Signed)
12/20/13 Hgb A1c 7.3 04/09/14 CBG am 150-180, pm 186/235-adding Lantus 3 units qd qhs.  05/28/14 update Hgb A1c 7.1 06/27/14 continue Lantus 3 units daily.

## 2014-07-09 NOTE — Assessment & Plan Note (Signed)
Patient was known to have a history of pancreatic cancer with liver metastases. Patient was seen in consultation by Dr. Marin Olp for oncology and it was recommended that patient resume his dose of xeloda. Patient will follow-up with oncology as outpatient.

## 2014-07-09 NOTE — Assessment & Plan Note (Signed)
Patient had presented with a fever of 104 noted at the skilled nursing facility prior to admission. Patient did have a history of metastatic pancreatic cancer was subsequently admitted. Patient was subsequently pancultured and placed empirically on IV vancomycin and IV Zosyn on admission. Urinalysis which was done was negative. Chest x-ray which was done was negative for any acute infiltrate. Blood cultures were obtained with no growth to date. Patient improved clinically remained afebrile throughout the hospitalization and was subsequently transitioned to oral Levaquin. Patient will be discharged on 3 more days of oral Levaquin to complete a one-week course of antibiotic therapy. Patient will follow-up with PCP and [facility] skilled nursing facility.

## 2014-07-09 NOTE — Assessment & Plan Note (Signed)
05/01/14 albumin 2.8. Malignancy is contributory. Dietary supplement.

## 2014-07-09 NOTE — Assessment & Plan Note (Signed)
Takes ranitidine 150mg  bid.

## 2014-07-09 NOTE — Assessment & Plan Note (Signed)
Takes Pregabalin 75mg  daily. Ambulates with walker on unit

## 2014-07-09 NOTE — Assessment & Plan Note (Signed)
Takes Temazepam 15mg  qhs prn for sleep.

## 2014-07-09 NOTE — Assessment & Plan Note (Signed)
likely secondary to fever. Patient was pancultured and no source of infection was identified. Chest x-ray which was done was negative. Patient had been placed empirically on IV vancomycin and IV Zosyn. Patient improved clinically and was at his baseline, during the hospitalization. Patient was subsequently transitioned to oral Levaquin and will finish a course as outpatient.

## 2014-07-09 NOTE — Assessment & Plan Note (Signed)
Adequately managed with MiraLax bid prn, Enulose 15mg  bid prn, Colace 100mg  bid, Bisacodyl 10mg  suppository qd prn.

## 2014-07-09 NOTE — Assessment & Plan Note (Signed)
05/04/14 Doppler LLE: DVT at left femoral and popliteal veins not completely occlusive. Will anticoagulate with Eliquis 2.5mg  bid.

## 2014-07-11 ENCOUNTER — Encounter: Payer: Self-pay | Admitting: Hematology & Oncology

## 2014-07-11 ENCOUNTER — Other Ambulatory Visit (HOSPITAL_BASED_OUTPATIENT_CLINIC_OR_DEPARTMENT_OTHER): Payer: Medicare Other | Admitting: Lab

## 2014-07-11 ENCOUNTER — Ambulatory Visit (HOSPITAL_BASED_OUTPATIENT_CLINIC_OR_DEPARTMENT_OTHER): Payer: Medicare Other | Admitting: Hematology & Oncology

## 2014-07-11 VITALS — BP 118/51 | HR 53 | Temp 98.2°F | Resp 16 | Wt 164.0 lb

## 2014-07-11 DIAGNOSIS — I82403 Acute embolism and thrombosis of unspecified deep veins of lower extremity, bilateral: Secondary | ICD-10-CM

## 2014-07-11 DIAGNOSIS — C787 Secondary malignant neoplasm of liver and intrahepatic bile duct: Secondary | ICD-10-CM

## 2014-07-11 DIAGNOSIS — C250 Malignant neoplasm of head of pancreas: Secondary | ICD-10-CM

## 2014-07-11 DIAGNOSIS — C251 Malignant neoplasm of body of pancreas: Secondary | ICD-10-CM

## 2014-07-11 LAB — HEPATIC FUNCTION PANEL
ALT: 26 U/L (ref 10–40)
AST: 23 U/L (ref 14–40)
Alkaline Phosphatase: 203 U/L — AB (ref 25–125)
Bilirubin, Total: 0.7 mg/dL

## 2014-07-11 LAB — CMP (CANCER CENTER ONLY)
ALK PHOS: 203 U/L — AB (ref 26–84)
ALT(SGPT): 26 U/L (ref 10–47)
AST: 23 U/L (ref 11–38)
Albumin: 3.3 g/dL (ref 3.3–5.5)
BILIRUBIN TOTAL: 0.7 mg/dL (ref 0.20–1.60)
BUN: 24 mg/dL — AB (ref 7–22)
CO2: 29 mEq/L (ref 18–33)
CREATININE: 1 mg/dL (ref 0.6–1.2)
Calcium: 9.5 mg/dL (ref 8.0–10.3)
Chloride: 96 mEq/L — ABNORMAL LOW (ref 98–108)
Glucose, Bld: 179 mg/dL — ABNORMAL HIGH (ref 73–118)
Potassium: 4.2 mEq/L (ref 3.3–4.7)
Sodium: 137 mEq/L (ref 128–145)
Total Protein: 7.4 g/dL (ref 6.4–8.1)

## 2014-07-11 LAB — CBC WITH DIFFERENTIAL (CANCER CENTER ONLY)
BASO#: 0 10*3/uL (ref 0.0–0.2)
BASO%: 0.5 % (ref 0.0–2.0)
EOS%: 1.1 % (ref 0.0–7.0)
Eosinophils Absolute: 0.1 10*3/uL (ref 0.0–0.5)
HEMATOCRIT: 33.9 % — AB (ref 38.7–49.9)
HEMOGLOBIN: 11.3 g/dL — AB (ref 13.0–17.1)
LYMPH#: 1.4 10*3/uL (ref 0.9–3.3)
LYMPH%: 25.3 % (ref 14.0–48.0)
MCH: 33.9 pg — ABNORMAL HIGH (ref 28.0–33.4)
MCHC: 33.3 g/dL (ref 32.0–35.9)
MCV: 102 fL — AB (ref 82–98)
MONO#: 0.4 10*3/uL (ref 0.1–0.9)
MONO%: 7.2 % (ref 0.0–13.0)
NEUT#: 3.8 10*3/uL (ref 1.5–6.5)
NEUT%: 65.9 % (ref 40.0–80.0)
Platelets: 208 10*3/uL (ref 145–400)
RBC: 3.33 10*6/uL — ABNORMAL LOW (ref 4.20–5.70)
RDW: 16.5 % — ABNORMAL HIGH (ref 11.1–15.7)
WBC: 5.7 10*3/uL (ref 4.0–10.0)

## 2014-07-11 LAB — CBC AND DIFFERENTIAL
HCT: 34 % — AB (ref 41–53)
Hemoglobin: 11.3 g/dL — AB (ref 13.5–17.5)
PLATELETS: 208 10*3/uL (ref 150–399)
WBC: 5.7 10*3/mL

## 2014-07-11 LAB — BASIC METABOLIC PANEL
BUN: 24 mg/dL — AB (ref 4–21)
Creatinine: 1 mg/dL (ref 0.6–1.3)
Glucose: 179 mg/dL
Potassium: 4.2 mmol/L (ref 3.4–5.3)
SODIUM: 137 mmol/L (ref 137–147)

## 2014-07-11 LAB — PREALBUMIN: Prealbumin: 17.7 mg/dL (ref 17.0–34.0)

## 2014-07-11 NOTE — Progress Notes (Signed)
Hematology and Oncology Follow Up Visit  [name] [name] [id number] [birth year] 80 y.o. 07/11/2014   Principle Diagnosis:  Stage IV pancreatic cancer with mets to liver. Bilateral lower extremity DVT  Current Therapy:    Xeloda s/p 2 cycle  ELIQUIS 2.5 mg by mouth twice a day     Interim History:  [name] is back for follow-up. He was hospitalized last week. He had a fever and appear to be septic. All cultures were negative. He improved quite nicely.  He had been on his second cycle of Xeloda. He had taken 5 days before he was hospitalized. After he got home, he restarted the Xeloda. So far, he's had no problems with this.  His appetite is doing fairly well.  He is on ELIQUIS for his lower extremity DVTs. He has minimal swelling in his legs. At some point, we probably will have to check his legs with Dopplers.  He's had no cough. He's had no nausea or vomiting. There has been no bleeding.  Overall, his performance status is ECOG 1.  His last CA 19-9 in early December was 232.   Medications: Current outpatient prescriptions: apixaban (ELIQUIS) 2.5 MG TABS tablet, Take 2.5 mg by mouth 2 (two) times daily., Disp: , Rfl: ;  bisacodyl (DULCOLAX) 10 MG suppository, Place 1 suppository (10 mg total) rectally daily as needed for moderate constipation., Disp: 12 suppository, Rfl: 0 capecitabine (XELODA) 500 MG tablet, Take 3 tablets (1,500 mg total) by mouth 2 (two) times daily after a meal. TAKES 3 TABS (1500) MG BID  ///  ON 14 DAYS OFF 7 DAYS FOR 3 CYCLES.   STARTED 06/06/14, Disp: , Rfl: ;  carvedilol (COREG) 12.5 MG tablet, Take 12.5 mg by mouth 2 (two) times daily with a meal., Disp: , Rfl: ;  docusate sodium 100 MG CAPS, Take 100 mg by mouth 2 (two) times daily., Disp: 60 capsule, Rfl: 0 feeding supplement, GLUCERNA SHAKE, (GLUCERNA SHAKE) LIQD, Take 237 mLs by mouth 2 (two) times daily between meals as needed (If pt with <50% meal completion)., Disp: , Rfl: 0;   HYDROcodone-acetaminophen (NORCO) 10-325 MG per tablet, Take one tablet by mouth every 4 hours as needed for pain, Disp: 20 tablet, Rfl: 0;  insulin glargine (LANTUS) 100 UNIT/ML injection, Inject 3 Units into the skin at bedtime., Disp: , Rfl:  lactose free nutrition (BOOST) LIQD, Take 237 mLs by mouth daily at 3 pm., Disp: , Rfl: ;  lidocaine-prilocaine (EMLA) cream, Apply 1 application topically as needed. (Patient taking differently: Apply 1 application topically as needed (port access). ), Disp: 30 g, Rfl: 0;  LORazepam (ATIVAN) 0.5 MG tablet, Take 2 tablets (1 mg total) by mouth every 6 (six) hours as needed (nausea/vomiting)., Disp: 15 tablet, Rfl: 0 nitroGLYCERIN (NITROSTAT) 0.4 MG SL tablet, Place 1 tablet (0.4 mg total) under the tongue every 5 (five) minutes as needed for chest pain., Disp: 90 tablet, Rfl: 3;  Omega-3 Fatty Acids (FISH OIL) 1000 MG CAPS, Take 1,000 mg by mouth every morning. , Disp: , Rfl: ;  ondansetron (ZOFRAN) 8 MG tablet, Take by mouth 2 (two) times daily as needed for nausea or vomiting. , Disp: , Rfl:  polyethylene glycol (MIRALAX / GLYCOLAX) packet, Take 17 g by mouth 2 (two) times daily as needed for mild constipation., Disp: , Rfl: ;  pravastatin (PRAVACHOL) 40 MG tablet, Take 40 mg by mouth every evening. , Disp: , Rfl: ;  pregabalin (LYRICA) 75 MG capsule, Take 75 mg  by mouth every morning. , Disp: , Rfl: ;  prochlorperazine (COMPAZINE) 5 MG tablet, Take 5 mg by mouth every 6 (six) hours as needed for nausea or vomiting., Disp: , Rfl:  ranitidine (ZANTAC) 150 MG tablet, Take 150 mg by mouth 2 (two) times daily., Disp: , Rfl: ;  temazepam (RESTORIL) 15 MG capsule, Take 15 mg by mouth at bedtime. , Disp: , Rfl: ;  [DISCONTINUED] Calcium Carbonate-Vitamin D (CALTRATE 600+D) 600-400 MG-UNIT per tablet, Take 1 tablet by mouth daily. , Disp: , Rfl:   Allergies: No Known Allergies  Past Medical History, Surgical history, Social history, and Family History were reviewed and  updated.  Review of Systems: As above  Physical Exam:  weight is 164 lb (74.39 kg). His oral temperature is 98.2 F (36.8 C). His blood pressure is 118/51 and his pulse is 53. His respiration is 16.   Elderly white gentleman in no obvious distress. Head and neck exam shows no ocular or oral lesions. He has no palpable cervical or supraclavicular lymph nodes. There might be some slight temporal muscle wasting. Lungs are clear. Cardiac exam regular rate and rhythm with no murmurs, rubs or bruits. Abdomen is soft. There is no fluid wave. There is no palpable liver or spleen tip. He has no palpable abdominal mass. Back exam shows no tenderness over the spine, ribs or hips. Extremities shows some trace edema in his lower legs. He has some osteoarthritic changes in his joints. Skin exam shows no rashes, ecchymoses or petechia.  Lab Results  Component Value Date   WBC 5.7 07/11/2014   HGB 11.3* 07/11/2014   HCT 33.9* 07/11/2014   MCV 102* 07/11/2014   PLT 208 07/11/2014     Chemistry      Component Value Date/Time   NA 137 07/11/2014 1152   NA 138 07/06/2014 0500   NA 136* 05/29/2014   K 4.2 07/11/2014 1152   K 4.2 07/06/2014 0500   CL 96* 07/11/2014 1152   CL 99 07/06/2014 0500   CO2 29 07/11/2014 1152   CO2 28 07/06/2014 0500   BUN 24* 07/11/2014 1152   BUN 23 07/06/2014 0500   BUN 22* 05/29/2014   CREATININE 1.0 07/11/2014 1152   CREATININE 1.02 07/06/2014 0500   CREATININE 1.0 05/29/2014   GLU 108 05/29/2014      Component Value Date/Time   CALCIUM 9.5 07/11/2014 1152   CALCIUM 9.7 07/06/2014 0500   ALKPHOS 203* 07/11/2014 1152   ALKPHOS 270* 07/05/2014 0420   AST 23 07/11/2014 1152   AST 30 07/05/2014 0420   ALT 26 07/11/2014 1152   ALT 51 07/05/2014 0420   BILITOT 0.70 07/11/2014 1152   BILITOT 0.4 07/05/2014 0420         Impression and Plan: [name] [name] is 80 year old gentleman. He has metastatic pancreatic cancer. He is on Xeloda right now. He really did not  have a good response to gemcitabine.  By the improvement in his liver function tests, I would have to think that he is responding.  We will go ahead and start him on his third cycle of treatment on January 4. After his third cycle of treatment, we will then repeat his scans.  I'm just thankful that his quality of life is doing quite well. I spent about 30 minutes with he and his friend. I  I want to see him back when he starts his third cycle of Xeloda.  Again, we probably should do some Dopplers of his legs  to see how his thromboembolic disease is responding.   Volanda Napoleon, MD 12/23/20151:12 PM

## 2014-07-12 ENCOUNTER — Telehealth: Payer: Self-pay | Admitting: Hematology & Oncology

## 2014-07-12 NOTE — Telephone Encounter (Signed)
[employer] has APPROVED a temporary supply of the the LORAZEPAM TAB .5MG .       P: [phone] F: [phone]

## 2014-07-16 ENCOUNTER — Other Ambulatory Visit: Payer: Self-pay | Admitting: Nurse Practitioner

## 2014-07-19 ENCOUNTER — Inpatient Hospital Stay (HOSPITAL_COMMUNITY)
Admission: EM | Admit: 2014-07-19 | Discharge: 2014-07-23 | DRG: 864 | Disposition: A | Discharge status: Skilled Nursing Facility | Payer: Medicare Other | Attending: Internal Medicine | Admitting: Internal Medicine

## 2014-07-19 ENCOUNTER — Encounter (HOSPITAL_COMMUNITY): Payer: Self-pay | Admitting: Emergency Medicine

## 2014-07-19 ENCOUNTER — Emergency Department (HOSPITAL_COMMUNITY): Payer: Medicare Other

## 2014-07-19 DIAGNOSIS — D539 Nutritional anemia, unspecified: Secondary | ICD-10-CM | POA: Diagnosis present

## 2014-07-19 DIAGNOSIS — M1990 Unspecified osteoarthritis, unspecified site: Secondary | ICD-10-CM | POA: Diagnosis present

## 2014-07-19 DIAGNOSIS — D638 Anemia in other chronic diseases classified elsewhere: Secondary | ICD-10-CM | POA: Diagnosis present

## 2014-07-19 DIAGNOSIS — Z8551 Personal history of malignant neoplasm of bladder: Secondary | ICD-10-CM

## 2014-07-19 DIAGNOSIS — Z9842 Cataract extraction status, left eye: Secondary | ICD-10-CM

## 2014-07-19 DIAGNOSIS — C787 Secondary malignant neoplasm of liver and intrahepatic bile duct: Secondary | ICD-10-CM | POA: Diagnosis present

## 2014-07-19 DIAGNOSIS — R509 Fever, unspecified: Secondary | ICD-10-CM | POA: Diagnosis not present

## 2014-07-19 DIAGNOSIS — Z9841 Cataract extraction status, right eye: Secondary | ICD-10-CM

## 2014-07-19 DIAGNOSIS — E785 Hyperlipidemia, unspecified: Secondary | ICD-10-CM | POA: Diagnosis present

## 2014-07-19 DIAGNOSIS — K589 Irritable bowel syndrome without diarrhea: Secondary | ICD-10-CM | POA: Diagnosis present

## 2014-07-19 DIAGNOSIS — E119 Type 2 diabetes mellitus without complications: Secondary | ICD-10-CM

## 2014-07-19 DIAGNOSIS — R031 Nonspecific low blood-pressure reading: Secondary | ICD-10-CM

## 2014-07-19 DIAGNOSIS — Z7901 Long term (current) use of anticoagulants: Secondary | ICD-10-CM

## 2014-07-19 DIAGNOSIS — C799 Secondary malignant neoplasm of unspecified site: Secondary | ICD-10-CM

## 2014-07-19 DIAGNOSIS — Z9049 Acquired absence of other specified parts of digestive tract: Secondary | ICD-10-CM | POA: Diagnosis present

## 2014-07-19 DIAGNOSIS — R109 Unspecified abdominal pain: Secondary | ICD-10-CM

## 2014-07-19 DIAGNOSIS — Z86718 Personal history of other venous thrombosis and embolism: Secondary | ICD-10-CM

## 2014-07-19 DIAGNOSIS — R609 Edema, unspecified: Secondary | ICD-10-CM

## 2014-07-19 DIAGNOSIS — I2510 Atherosclerotic heart disease of native coronary artery without angina pectoris: Secondary | ICD-10-CM | POA: Diagnosis present

## 2014-07-19 DIAGNOSIS — Z85828 Personal history of other malignant neoplasm of skin: Secondary | ICD-10-CM

## 2014-07-19 DIAGNOSIS — Z96651 Presence of right artificial knee joint: Secondary | ICD-10-CM | POA: Diagnosis present

## 2014-07-19 DIAGNOSIS — Z87891 Personal history of nicotine dependence: Secondary | ICD-10-CM

## 2014-07-19 DIAGNOSIS — K219 Gastro-esophageal reflux disease without esophagitis: Secondary | ICD-10-CM | POA: Diagnosis present

## 2014-07-19 DIAGNOSIS — I10 Essential (primary) hypertension: Secondary | ICD-10-CM | POA: Diagnosis present

## 2014-07-19 DIAGNOSIS — B955 Unspecified streptococcus as the cause of diseases classified elsewhere: Secondary | ICD-10-CM | POA: Clinically undetermined

## 2014-07-19 DIAGNOSIS — I5022 Chronic systolic (congestive) heart failure: Secondary | ICD-10-CM | POA: Diagnosis present

## 2014-07-19 DIAGNOSIS — H9190 Unspecified hearing loss, unspecified ear: Secondary | ICD-10-CM | POA: Diagnosis present

## 2014-07-19 DIAGNOSIS — Z86010 Personal history of colonic polyps: Secondary | ICD-10-CM

## 2014-07-19 DIAGNOSIS — C259 Malignant neoplasm of pancreas, unspecified: Secondary | ICD-10-CM | POA: Diagnosis present

## 2014-07-19 DIAGNOSIS — R7881 Bacteremia: Secondary | ICD-10-CM

## 2014-07-19 DIAGNOSIS — Z961 Presence of intraocular lens: Secondary | ICD-10-CM | POA: Diagnosis present

## 2014-07-19 LAB — COMPREHENSIVE METABOLIC PANEL
ALK PHOS: 278 U/L — AB (ref 39–117)
ALT: 48 U/L (ref 0–53)
AST: 76 U/L — ABNORMAL HIGH (ref 0–37)
Albumin: 3.4 g/dL — ABNORMAL LOW (ref 3.5–5.2)
Anion gap: 8 (ref 5–15)
BILIRUBIN TOTAL: 0.6 mg/dL (ref 0.3–1.2)
BUN: 24 mg/dL — ABNORMAL HIGH (ref 6–23)
CO2: 26 mmol/L (ref 19–32)
Calcium: 8.8 mg/dL (ref 8.4–10.5)
Chloride: 98 mEq/L (ref 96–112)
Creatinine, Ser: 1.09 mg/dL (ref 0.50–1.35)
GFR calc Af Amer: 67 mL/min — ABNORMAL LOW (ref >90)
GFR, EST NON AFRICAN AMERICAN: 58 mL/min — AB (ref >90)
Glucose, Bld: 217 mg/dL — ABNORMAL HIGH (ref 70–99)
Potassium: 3.8 mmol/L (ref 3.5–5.1)
SODIUM: 132 mmol/L — AB (ref 135–145)
Total Protein: 6.7 g/dL (ref 6.0–8.3)

## 2014-07-19 LAB — CBC WITH DIFFERENTIAL/PLATELET
Basophils Absolute: 0 10*3/uL (ref 0.0–0.1)
Basophils Relative: 0 % (ref 0–1)
Eosinophils Absolute: 0 10*3/uL (ref 0.0–0.7)
Eosinophils Relative: 0 % (ref 0–5)
HCT: 29.9 % — ABNORMAL LOW (ref 39.0–52.0)
Hemoglobin: 10 g/dL — ABNORMAL LOW (ref 13.0–17.0)
LYMPHS ABS: 0.6 10*3/uL — AB (ref 0.7–4.0)
LYMPHS PCT: 11 % — AB (ref 12–46)
MCH: 33.7 pg (ref 26.0–34.0)
MCHC: 33.4 g/dL (ref 30.0–36.0)
MCV: 100.7 fL — AB (ref 78.0–100.0)
Monocytes Absolute: 0.5 10*3/uL (ref 0.1–1.0)
Monocytes Relative: 9 % (ref 3–12)
Neutro Abs: 4.5 10*3/uL (ref 1.7–7.7)
Neutrophils Relative %: 80 % — ABNORMAL HIGH (ref 43–77)
PLATELETS: 168 10*3/uL (ref 150–400)
RBC: 2.97 MIL/uL — AB (ref 4.22–5.81)
RDW: 17.1 % — ABNORMAL HIGH (ref 11.5–15.5)
WBC: 5.7 10*3/uL (ref 4.0–10.5)

## 2014-07-19 LAB — URINE MICROSCOPIC-ADD ON

## 2014-07-19 LAB — URINALYSIS, ROUTINE W REFLEX MICROSCOPIC
Bilirubin Urine: NEGATIVE
Glucose, UA: NEGATIVE mg/dL
Ketones, ur: NEGATIVE mg/dL
Leukocytes, UA: NEGATIVE
Nitrite: NEGATIVE
Protein, ur: NEGATIVE mg/dL
Specific Gravity, Urine: 1.008 (ref 1.005–1.030)
Urobilinogen, UA: 0.2 mg/dL (ref 0.0–1.0)
pH: 5.5 (ref 5.0–8.0)

## 2014-07-19 LAB — I-STAT CG4 LACTIC ACID, ED: Lactic Acid, Venous: 2.11 mmol/L (ref 0.5–2.2)

## 2014-07-19 MED ORDER — SODIUM CHLORIDE 0.9 % IV BOLUS (SEPSIS)
1000.0000 mL | Freq: Once | INTRAVENOUS | Status: AC
  Administered 2014-07-19: 1000 mL via INTRAVENOUS

## 2014-07-19 NOTE — Progress Notes (Signed)
CSW attempted to meet with patient at bedside. However, patient was asleep. According to note, [facility] comes from [facility] [employer].  Willette Brace [phone] ED CSW 07/19/2014 11:13 PM

## 2014-07-19 NOTE — ED Notes (Signed)
Pt's goddaughter, [name] [phone].

## 2014-07-19 NOTE — ED Notes (Signed)
Patient arrives by EMS with complaints of fever of 103.2 at 1830 this evening.  Patient from [place].  SNF administered tylenol at that time.  No other complaints per patient, also given Ativan for the chills. Temp now 102.5

## 2014-07-19 NOTE — ED Notes (Signed)
Patient returned from X-ray 

## 2014-07-19 NOTE — ED Notes (Signed)
Bed: WA07 Expected date:  Expected time:  Means of arrival:  Comments: EMS 

## 2014-07-19 NOTE — ED Provider Notes (Signed)
CSN: [id number]     Arrival date & time 07/19/14  1935 History   First MD Initiated Contact with Patient 07/19/14 2004     Chief Complaint  Patient presents with  . Fever     (Consider location/radiation/quality/duration/timing/severity/associated sxs/prior Treatment) Patient is a 80 y.o. male presenting with fever. The history is provided by the patient.  Fever Associated symptoms: no chest pain, no confusion, no diarrhea, no dysuria, no headaches, no rash, no sore throat and no vomiting   pt w hx stage IV pancreatic ca/liver mets with onset fevers today, noted at ecf, to 103.  Pt denies specific pain or c/o. No headache. No cough, sore throat, nasal congestion or uri symptoms. No cp or sob. No abd pain. No nvd. No dysuria or gu c/o. No rash. No extremity pain, swelling, redness. States has had normal appetite.  Mild gen weakness.     Past Medical History  Diagnosis Date  . Diverticulosis of colon (without mention of hemorrhage)   . Irritable bowel syndrome   . Personal history of malignant neoplasm of prostate     s/p  radioactive seed implants 2000  . Unspecified hereditary and idiopathic peripheral neuropathy   . Nonischemic cardiomyopathy cardiologist-  dr Aundra Dubin    EF 25%  per last echo 2012 and cardiologist note--  2007-  ef  40-45%;  2008- ef 35-40%;  2010- ef 30-35%;  2012- ef 25%  . Sinus bradycardia   . Hypertension   . Arthritis     hands  . Hyperlipidemia   . History of DVT of lower extremity     2002  ,   left leg  . History of adenomatous polyp of colon     2001  . History of basal cell carcinoma excision   . Anticoagulated on Coumadin   . Type 2 diabetes, diet controlled   . GERD (gastroesophageal reflux disease)   . LBBB (left bundle branch block)   . Wears glasses   . Wears hearing aid     BILATERAL  . Unsteady gait   . DDD (degenerative disc disease)     cervical and lumbar  . DJD (degenerative joint disease)     hips  . Lower urinary tract symptoms  (LUTS)   . 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 Coronary artery disease     per cath 2002  non-obstructive cad  . Chronic systolic heart failure   . H/O fracture of nose     12-30-2013-  pt fell at home open nose bone fx   Past Surgical History  Procedure Laterality Date  . Knee arthroscopy Left 1991  . Ercp with sphincterotomy and stone removal  05/ 1993  &  05-11-2006  . Total knee arthroplasty  10/05/2011    Procedure: TOTAL KNEE ARTHROPLASTY;  Surgeon: Gearlean Alf, MD;  Location: [facility];  Service: Orthopedics;  Laterality: Right;  . Transurethral resection of bladder tumor N/A 09/30/2012    Procedure: TRANSURETHRAL RESECTION OF BLADDER TUMOR (TURBT);  Surgeon: Franchot Gallo, MD;  Location: [facility];  Service: Urology;  Laterality: N/A;  WITH MITOMYCIN INSTILLATION   . Eus N/A 01/04/2014    Procedure: ESOPHAGEAL ENDOSCOPIC ULTRASOUND (EUS) RADIAL;  Surgeon: [name], MD;  Location: [facility];  Service: Endoscopy;  Laterality: N/A;  . Radioactive prostate seed implants  01/ 2000  . Mohs surgery  03/ 2009  LEFT EAR BASAL CELL CARCINOMA  . Tonsillectomy  AS CHILD  . Cholecystectomy open  1985  . Appendectomy  child  . Inguinal hernia repair Bilateral 1998    recurrent RIH  w/ repair 06-21-2006  . Total knee arthroplasty Left 02-21-2007    AND 04-21-2007  CLOSED MANIPULATION  . Cataract extraction w/ intraocular lens implant Right 2004  . Lumbar spine surgery  X2  . Cardiac catheterization  06-14-2001  dr Darnell Level brodie    non-obstructive CAD/  LM 30%,  pLAD 50%,  mLAD 30%,  RCA  &  CFX  irregularites ,  global hypokinesis,  ef 40%  . Cardiovascular stress test  02-07-2007   dr Darnell Level brodie    negative for ischemia,  low risk myoview perfusion study  . Transthoracic echocardiogram  01-28-2011    moderate LVH/  ef 25%/  diffuse hypokinesis/  mild LAE/  mild TR/  trivial AR & MR   . Cystoscopy w/ retrogrades Bilateral 03/09/2014    Procedure: CYSTOSCOPY WITH BILATERAL RETROGRADE PYELOGRAM BLADDER BX AND RENAL WASHINGS.;  Surgeon: Jorja Loa, MD;  Location: [facility];  Service: Urology;  Laterality: Bilateral;  . Eus N/A 04/05/2014    Procedure: UPPER ENDOSCOPIC ULTRASOUND (EUS) LINEAR;  Surgeon: [name], MD;  Location: [facility];  Service: Endoscopy;  Laterality: N/A;  radial linear  . Endoscopic retrograde cholangiopancreatography (ercp) with propofol N/A 04/12/2014    Procedure: ENDOSCOPIC RETROGRADE CHOLANGIOPANCREATOGRAPHY (ERCP) WITH PROPOFOL;  Surgeon: [name], MD;  Location: [facility];  Service: Endoscopy;  Laterality: N/A;  metal stent  . Biliary stent placement N/A 04/12/2014    Procedure: BILIARY STENT PLACEMENT;  Surgeon: [name], MD;  Location: [facility];  Service: Endoscopy;  Laterality: N/A;   Family History  Problem Relation Age of Onset  . Throat cancer Brother   . Coronary artery disease Father     deceased   History  Substance Use Topics  . Smoking status: Former Smoker -- 1.00 packs/day for 10 years    Types: Cigarettes    Start date: 04/10/1954    Quit date: 09/10/1982  . Smokeless tobacco: Never Used     Comment: quit 32 years ago  . Alcohol Use: No    Review of Systems  Constitutional: Positive for fever.  HENT: Negative for sinus pressure and sore throat.   Eyes: Negative for redness.  Respiratory: Negative for shortness of breath.   Cardiovascular: Negative for chest pain.  Gastrointestinal: Negative for vomiting, abdominal pain and diarrhea.  Endocrine: Negative for polyuria.  Genitourinary: Negative for dysuria and flank pain.  Musculoskeletal: Negative for back pain and neck pain.  Skin: Negative for rash.  Neurological: Negative for headaches.  Hematological: Does not bruise/bleed easily.  Psychiatric/Behavioral: Negative for confusion.      Allergies  Review of  patient's allergies indicates no known allergies.  Home Medications   Prior to Admission medications   Medication Sig Start Date End Date Taking? Authorizing Provider  apixaban (ELIQUIS) 2.5 MG TABS tablet Take 2.5 mg by mouth 2 (two) times daily.    Historical Provider, MD  bisacodyl (DULCOLAX) 10 MG suppository Place 1 suppository (10 mg total) rectally daily as needed for moderate constipation. 04/03/14   Venetia Maxon Rama, MD  capecitabine (XELODA) 500 MG tablet Take 3 tablets (1,500 mg total) by mouth 2 (two) times daily after a meal. TAKES 3 TABS (1500) MG BID  ///  ON 14 DAYS OFF 7 DAYS FOR 3  CYCLES.   STARTED 06/06/14 07/06/14   Eugenie Filler, MD  carvedilol (COREG) 12.5 MG tablet Take 12.5 mg by mouth 2 (two) times daily with a meal.    Historical Provider, MD  docusate sodium 100 MG CAPS Take 100 mg by mouth 2 (two) times daily. 04/03/14   Venetia Maxon Rama, MD  feeding supplement, GLUCERNA SHAKE, (GLUCERNA SHAKE) LIQD Take 237 mLs by mouth 2 (two) times daily between meals as needed (If pt with <50% meal completion). 07/06/14   Eugenie Filler, MD  HYDROcodone-acetaminophen [facility]) 10-325 MG per tablet Take one tablet by mouth every 4 hours as needed for pain 07/06/14   Eugenie Filler, MD  insulin glargine (LANTUS) 100 UNIT/ML injection Inject 3 Units into the skin at bedtime.    Historical Provider, MD  lactose free nutrition (BOOST) LIQD Take 237 mLs by mouth daily at 3 pm.    Historical Provider, MD  lidocaine-prilocaine (EMLA) cream Apply 1 application topically as needed. Patient taking differently: Apply 1 application topically as needed (port access).  04/16/14   Volanda Napoleon, MD  LORazepam (ATIVAN) 0.5 MG tablet Take 2 tablets (1 mg total) by mouth every 6 (six) hours as needed (nausea/vomiting). 07/06/14   Eugenie Filler, MD  nitroGLYCERIN (NITROSTAT) 0.4 MG SL tablet Place 1 tablet (0.4 mg total) under the tongue every 5 (five) minutes as needed for chest pain. 11/22/13    Thompson Grayer, MD  Omega-3 Fatty Acids (FISH OIL) 1000 MG CAPS Take 1,000 mg by mouth every morning.     Historical Provider, MD  ondansetron (ZOFRAN) 8 MG tablet Take by mouth 2 (two) times daily as needed for nausea or vomiting.     Historical Provider, MD  polyethylene glycol (MIRALAX / GLYCOLAX) packet Take 17 g by mouth 2 (two) times daily as needed for mild constipation.    Historical Provider, MD  pravastatin (PRAVACHOL) 40 MG tablet Take 40 mg by mouth every evening.     Historical Provider, MD  pregabalin (LYRICA) 75 MG capsule Take 75 mg by mouth every morning.     Historical Provider, MD  prochlorperazine (COMPAZINE) 5 MG tablet Take 5 mg by mouth every 6 (six) hours as needed for nausea or vomiting.    Historical Provider, MD  ranitidine (ZANTAC) 150 MG tablet Take 150 mg by mouth 2 (two) times daily.    Historical Provider, MD  temazepam (RESTORIL) 15 MG capsule Take 15 mg by mouth at bedtime.     Historical Provider, MD   BP 111/48 mmHg  Pulse 73  Temp(Src) 100.2 F (37.9 C) (Oral)  Resp 20  SpO2 93% Physical Exam  Constitutional: He is oriented to person, place, and time. He appears well-developed and well-nourished. No distress.  HENT:  Mouth/Throat: Oropharynx is clear and moist.  Eyes: Conjunctivae are normal. No scleral icterus.  Neck: Neck supple. No tracheal deviation present.  No stiffness or rigidity  Cardiovascular: Normal rate, regular rhythm, normal heart sounds and intact distal pulses.  Exam reveals no gallop and no friction rub.   No murmur heard. Pulmonary/Chest: Effort normal and breath sounds normal. No accessory muscle usage. No respiratory distress.  Port right chest without sign of infection  Abdominal: Soft. Bowel sounds are normal. He exhibits no distension. There is no tenderness.  Genitourinary:  No cva tenderness  Musculoskeletal: Normal range of motion. He exhibits no edema or tenderness.  Lymphadenopathy:    He has no cervical adenopathy.   Neurological: He is  alert and oriented to person, place, and time.  Skin: Skin is warm and dry. No rash noted.  Psychiatric: He has a normal mood and affect.  Nursing note and vitals reviewed.   ED Course  Procedures (including critical care time) Labs Review  Results for orders placed or performed during the hospital encounter of 07/19/14  CBC with Differential  Result Value Ref Range   WBC 5.7 4.0 - 10.5 K/uL   RBC 2.97 (L) 4.22 - 5.81 MIL/uL   Hemoglobin 10.0 (L) 13.0 - 17.0 g/dL   HCT 29.9 (L) 39.0 - 52.0 %   MCV 100.7 (H) 78.0 - 100.0 fL   MCH 33.7 26.0 - 34.0 pg   MCHC 33.4 30.0 - 36.0 g/dL   RDW 17.1 (H) 11.5 - 15.5 %   Platelets 168 150 - 400 K/uL   Neutrophils Relative % 80 (H) 43 - 77 %   Neutro Abs 4.5 1.7 - 7.7 K/uL   Lymphocytes Relative 11 (L) 12 - 46 %   Lymphs Abs 0.6 (L) 0.7 - 4.0 K/uL   Monocytes Relative 9 3 - 12 %   Monocytes Absolute 0.5 0.1 - 1.0 K/uL   Eosinophils Relative 0 0 - 5 %   Eosinophils Absolute 0.0 0.0 - 0.7 K/uL   Basophils Relative 0 0 - 1 %   Basophils Absolute 0.0 0.0 - 0.1 K/uL  Comprehensive metabolic panel  Result Value Ref Range   Sodium 132 (L) 135 - 145 mmol/L   Potassium 3.8 3.5 - 5.1 mmol/L   Chloride 98 96 - 112 mEq/L   CO2 26 19 - 32 mmol/L   Glucose, Bld 217 (H) 70 - 99 mg/dL   BUN 24 (H) 6 - 23 mg/dL   Creatinine, Ser 1.09 0.50 - 1.35 mg/dL   Calcium 8.8 8.4 - 10.5 mg/dL   Total Protein 6.7 6.0 - 8.3 g/dL   Albumin 3.4 (L) 3.5 - 5.2 g/dL   AST 76 (H) 0 - 37 U/L   ALT 48 0 - 53 U/L   Alkaline Phosphatase 278 (H) 39 - 117 U/L   Total Bilirubin 0.6 0.3 - 1.2 mg/dL   GFR calc non Af Amer 58 (L) >90 mL/min   GFR calc Af Amer 67 (L) >90 mL/min   Anion gap 8 5 - 15  Urinalysis, Routine w reflex microscopic  Result Value Ref Range   Color, Urine YELLOW YELLOW   APPearance CLEAR CLEAR   Specific Gravity, Urine 1.008 1.005 - 1.030   pH 5.5 5.0 - 8.0   Glucose, UA NEGATIVE NEGATIVE mg/dL   Hgb urine dipstick SMALL (A)  NEGATIVE   Bilirubin Urine NEGATIVE NEGATIVE   Ketones, ur NEGATIVE NEGATIVE mg/dL   Protein, ur NEGATIVE NEGATIVE mg/dL   Urobilinogen, UA 0.2 0.0 - 1.0 mg/dL   Nitrite NEGATIVE NEGATIVE   Leukocytes, UA NEGATIVE NEGATIVE  Urine microscopic-add on  Result Value Ref Range   RBC / HPF 0-2 <3 RBC/hpf  I-Stat CG4 Lactic Acid, ED  Result Value Ref Range   Lactic Acid, Venous 2.11 0.5 - 2.2 mmol/L   Dg Chest 2 View  07/19/2014   CLINICAL DATA:  Fever  EXAM: CHEST  2 VIEW  COMPARISON:  07/04/2014  FINDINGS: There is mild cardiomegaly which is stable. Aortic and hilar contours are unchanged. Right IJ porta catheter, tip at the SVC level.  Streaky bibasilar lung opacities which favor atelectasis. No edema, effusion, or pneumothorax.  IMPRESSION: 1. Subsegmental bibasilar atelectasis. 2. Cardiomegaly  without failure.   Electronically Signed   By: Jorje Guild M.D.   On: 07/19/2014 20:55   Dg Chest 2 View  07/02/2014   CLINICAL DATA:  Fever and altered level of consciousness.  EXAM: CHEST  2 VIEW  COMPARISON:  PA and lateral chest 03/30/2014 03/01/2014 and 09/22/2012.  FINDINGS: Port-A-Cath is in place. Mild streaky opacity in the left lung base is most consistent with atelectasis or scar. No consolidative process, pneumothorax or effusion is identified. Heart size is upper normal.  IMPRESSION: No acute abnormality.   Electronically Signed   By: Inge Rise M.D.   On: 07/02/2014 18:20   Dg Chest Port 1 View  07/04/2014   CLINICAL DATA:  Productive cough, prostate cancer, hypertension, type 2 diabetes, bladder cancer, coronary artery disease, chronic systolic heart failure  EXAM: PORTABLE CHEST - 1 VIEW  COMPARISON:  Portable exam 1445 hr compared to 07/02/2014  FINDINGS: RIGHT jugular Port-A-Cath with tip projecting over distal SVC.  Enlargement of cardiac silhouette.  Mildly tortuous aorta with atherosclerotic calcification.  Pulmonary vascular congestion.  Bibasilar atelectasis.  No  infiltrate, pleural effusion or pneumothorax.  IMPRESSION: Enlargement of cardiac silhouette with slight pulmonary vascular congestion.  Bibasilar atelectasis.   Electronically Signed   By: Lavonia Dana M.D.   On: 07/04/2014 16:11       MDM   Iv ns. Labs. Cxr. Ua.  Reviewed nursing notes and prior charts for additional history.   Pt w fever, soft blood pressures in range 100/39, 92/40 in ED, which appear lower than baseline.  Iv ns bolus. Will culture.   On review chart, recent admit last month w same, cxs neg, pt improved on abx, and was d/c'd to ecf on abx.    No obvious source infection today, pt does have port/cvl without evidence external infxn however is possible source.   Given overall clinical picture, not clear why palliative medicine and/or hospice care would not have been involved in patients care.   Given hypotension, fevers, will admit for obs.     Mirna Mires, MD 07/20/14 [phone]

## 2014-07-19 NOTE — ED Notes (Signed)
Pt BIB EMS. Pt is from [place] at [place]. Pt had tympanic temp of 103.2 tonight. Pt was given Tylenol and Ativan PTA. Pt denies n/v, difficulty breathing. Pt a/o x 3. Skin warm and dry. No acute distress.

## 2014-07-20 ENCOUNTER — Inpatient Hospital Stay (HOSPITAL_COMMUNITY): Payer: Medicare Other

## 2014-07-20 DIAGNOSIS — R609 Edema, unspecified: Secondary | ICD-10-CM

## 2014-07-20 DIAGNOSIS — K219 Gastro-esophageal reflux disease without esophagitis: Secondary | ICD-10-CM | POA: Diagnosis present

## 2014-07-20 DIAGNOSIS — M1990 Unspecified osteoarthritis, unspecified site: Secondary | ICD-10-CM | POA: Diagnosis present

## 2014-07-20 DIAGNOSIS — C251 Malignant neoplasm of body of pancreas: Secondary | ICD-10-CM

## 2014-07-20 DIAGNOSIS — B955 Unspecified streptococcus as the cause of diseases classified elsewhere: Secondary | ICD-10-CM | POA: Diagnosis present

## 2014-07-20 DIAGNOSIS — D539 Nutritional anemia, unspecified: Secondary | ICD-10-CM | POA: Diagnosis present

## 2014-07-20 DIAGNOSIS — I10 Essential (primary) hypertension: Secondary | ICD-10-CM | POA: Diagnosis present

## 2014-07-20 DIAGNOSIS — I5022 Chronic systolic (congestive) heart failure: Secondary | ICD-10-CM | POA: Diagnosis present

## 2014-07-20 DIAGNOSIS — E785 Hyperlipidemia, unspecified: Secondary | ICD-10-CM | POA: Diagnosis present

## 2014-07-20 DIAGNOSIS — Z87891 Personal history of nicotine dependence: Secondary | ICD-10-CM | POA: Diagnosis not present

## 2014-07-20 DIAGNOSIS — R509 Fever, unspecified: Secondary | ICD-10-CM

## 2014-07-20 DIAGNOSIS — Z9049 Acquired absence of other specified parts of digestive tract: Secondary | ICD-10-CM | POA: Diagnosis present

## 2014-07-20 DIAGNOSIS — H9190 Unspecified hearing loss, unspecified ear: Secondary | ICD-10-CM | POA: Diagnosis present

## 2014-07-20 DIAGNOSIS — C259 Malignant neoplasm of pancreas, unspecified: Secondary | ICD-10-CM | POA: Diagnosis present

## 2014-07-20 DIAGNOSIS — Z961 Presence of intraocular lens: Secondary | ICD-10-CM | POA: Diagnosis present

## 2014-07-20 DIAGNOSIS — Z96651 Presence of right artificial knee joint: Secondary | ICD-10-CM | POA: Diagnosis present

## 2014-07-20 DIAGNOSIS — Z9841 Cataract extraction status, right eye: Secondary | ICD-10-CM | POA: Diagnosis not present

## 2014-07-20 DIAGNOSIS — I2510 Atherosclerotic heart disease of native coronary artery without angina pectoris: Secondary | ICD-10-CM | POA: Diagnosis present

## 2014-07-20 DIAGNOSIS — C787 Secondary malignant neoplasm of liver and intrahepatic bile duct: Secondary | ICD-10-CM | POA: Diagnosis present

## 2014-07-20 DIAGNOSIS — E1142 Type 2 diabetes mellitus with diabetic polyneuropathy: Secondary | ICD-10-CM

## 2014-07-20 DIAGNOSIS — Z9842 Cataract extraction status, left eye: Secondary | ICD-10-CM | POA: Diagnosis not present

## 2014-07-20 DIAGNOSIS — Z86010 Personal history of colonic polyps: Secondary | ICD-10-CM | POA: Diagnosis not present

## 2014-07-20 DIAGNOSIS — Z8551 Personal history of malignant neoplasm of bladder: Secondary | ICD-10-CM | POA: Diagnosis not present

## 2014-07-20 DIAGNOSIS — D638 Anemia in other chronic diseases classified elsewhere: Secondary | ICD-10-CM | POA: Diagnosis present

## 2014-07-20 DIAGNOSIS — Z7901 Long term (current) use of anticoagulants: Secondary | ICD-10-CM | POA: Diagnosis not present

## 2014-07-20 DIAGNOSIS — Z86718 Personal history of other venous thrombosis and embolism: Secondary | ICD-10-CM | POA: Diagnosis not present

## 2014-07-20 DIAGNOSIS — K589 Irritable bowel syndrome without diarrhea: Secondary | ICD-10-CM | POA: Diagnosis present

## 2014-07-20 DIAGNOSIS — E119 Type 2 diabetes mellitus without complications: Secondary | ICD-10-CM | POA: Diagnosis present

## 2014-07-20 DIAGNOSIS — Z85828 Personal history of other malignant neoplasm of skin: Secondary | ICD-10-CM | POA: Diagnosis not present

## 2014-07-20 HISTORY — DX: Fever, unspecified: R50.9

## 2014-07-20 LAB — COMPREHENSIVE METABOLIC PANEL
ALBUMIN: 3.1 g/dL — AB (ref 3.5–5.2)
ALT: 40 U/L (ref 0–53)
ANION GAP: 7 (ref 5–15)
AST: 57 U/L — ABNORMAL HIGH (ref 0–37)
Alkaline Phosphatase: 240 U/L — ABNORMAL HIGH (ref 39–117)
BUN: 21 mg/dL (ref 6–23)
CHLORIDE: 103 meq/L (ref 96–112)
CO2: 27 mmol/L (ref 19–32)
CREATININE: 0.99 mg/dL (ref 0.50–1.35)
Calcium: 8.8 mg/dL (ref 8.4–10.5)
GFR, EST AFRICAN AMERICAN: 82 mL/min — AB (ref >90)
GFR, EST NON AFRICAN AMERICAN: 70 mL/min — AB (ref >90)
Glucose, Bld: 122 mg/dL — ABNORMAL HIGH (ref 70–99)
Potassium: 4 mmol/L (ref 3.5–5.1)
Sodium: 137 mmol/L (ref 135–145)
TOTAL PROTEIN: 6.1 g/dL (ref 6.0–8.3)
Total Bilirubin: 0.9 mg/dL (ref 0.3–1.2)

## 2014-07-20 LAB — TSH: TSH: 0.846 u[IU]/mL (ref 0.350–4.500)

## 2014-07-20 LAB — CBC
HCT: 29.3 % — ABNORMAL LOW (ref 39.0–52.0)
Hemoglobin: 9.6 g/dL — ABNORMAL LOW (ref 13.0–17.0)
MCH: 33.3 pg (ref 26.0–34.0)
MCHC: 32.8 g/dL (ref 30.0–36.0)
MCV: 101.7 fL — AB (ref 78.0–100.0)
PLATELETS: 161 10*3/uL (ref 150–400)
RBC: 2.88 MIL/uL — ABNORMAL LOW (ref 4.22–5.81)
RDW: 17.3 % — ABNORMAL HIGH (ref 11.5–15.5)
WBC: 5 10*3/uL (ref 4.0–10.5)

## 2014-07-20 LAB — INFLUENZA PANEL BY PCR (TYPE A & B)
H1N1 flu by pcr: NOT DETECTED
Influenza A By PCR: NEGATIVE
Influenza B By PCR: NEGATIVE

## 2014-07-20 LAB — GLUCOSE, CAPILLARY
GLUCOSE-CAPILLARY: 128 mg/dL — AB (ref 70–99)
GLUCOSE-CAPILLARY: 183 mg/dL — AB (ref 70–99)
Glucose-Capillary: 131 mg/dL — ABNORMAL HIGH (ref 70–99)
Glucose-Capillary: 130 mg/dL — ABNORMAL HIGH (ref 70–99)
Glucose-Capillary: 142 mg/dL — ABNORMAL HIGH (ref 70–99)

## 2014-07-20 MED ORDER — VANCOMYCIN HCL IN DEXTROSE 750-5 MG/150ML-% IV SOLN
750.0000 mg | Freq: Two times a day (BID) | INTRAVENOUS | Status: DC
  Administered 2014-07-20 – 2014-07-22 (×5): 750 mg via INTRAVENOUS
  Filled 2014-07-20 (×8): qty 150

## 2014-07-20 MED ORDER — BISACODYL 10 MG RE SUPP: 10.0000 mg | Freq: Every day | RECTAL | Status: DC | PRN

## 2014-07-20 MED ORDER — PRAVASTATIN SODIUM 40 MG PO TABS
40.0000 mg | ORAL_TABLET | Freq: Every evening | ORAL | Status: DC
  Administered 2014-07-20 – 2014-07-22 (×3): 40 mg via ORAL
  Filled 2014-07-20 (×4): qty 1

## 2014-07-20 MED ORDER — HYDROCODONE-ACETAMINOPHEN 5-325 MG PO TABS
1.0000 | ORAL_TABLET | Freq: Three times a day (TID) | ORAL | Status: DC | PRN
  Administered 2014-07-20 – 2014-07-23 (×5): 1 via ORAL
  Filled 2014-07-20 (×5): qty 1

## 2014-07-20 MED ORDER — SODIUM CHLORIDE 0.9 % IV SOLN
INTRAVENOUS | Status: DC
  Administered 2014-07-20: 03:00:00 via INTRAVENOUS

## 2014-07-20 MED ORDER — INSULIN ASPART 100 UNIT/ML ~~LOC~~ SOLN
0.0000 [IU] | Freq: Three times a day (TID) | SUBCUTANEOUS | Status: DC
  Administered 2014-07-20: 2 [IU] via SUBCUTANEOUS
  Administered 2014-07-20: 1 [IU] via SUBCUTANEOUS
  Administered 2014-07-21 (×2): 2 [IU] via SUBCUTANEOUS
  Administered 2014-07-21: 1 [IU] via SUBCUTANEOUS
  Administered 2014-07-22: 2 [IU] via SUBCUTANEOUS
  Administered 2014-07-22 – 2014-07-23 (×2): 1 [IU] via SUBCUTANEOUS
  Administered 2014-07-23: 2 [IU] via SUBCUTANEOUS

## 2014-07-20 MED ORDER — VITAMIN B-1 100 MG PO TABS
100.0000 mg | ORAL_TABLET | Freq: Every day | ORAL | Status: DC
  Administered 2014-07-20 – 2014-07-23 (×4): 100 mg via ORAL
  Filled 2014-07-20 (×4): qty 1

## 2014-07-20 MED ORDER — GLUCERNA SHAKE PO LIQD
237.0000 mL | Freq: Two times a day (BID) | ORAL | Status: DC | PRN
  Filled 2014-07-20: qty 237

## 2014-07-20 MED ORDER — NITROGLYCERIN 0.4 MG SL SUBL: 0.4000 mg | SUBLINGUAL_TABLET | SUBLINGUAL | Status: DC | PRN

## 2014-07-20 MED ORDER — ZOLPIDEM TARTRATE 5 MG PO TABS
5.0000 mg | ORAL_TABLET | Freq: Every day | ORAL | Status: DC
  Administered 2014-07-20 – 2014-07-22 (×4): 5 mg via ORAL
  Filled 2014-07-20 (×4): qty 1

## 2014-07-20 MED ORDER — ONDANSETRON HCL 4 MG PO TABS: 4.0000 mg | ORAL_TABLET | Freq: Four times a day (QID) | ORAL | Status: DC | PRN

## 2014-07-20 MED ORDER — LORAZEPAM 0.5 MG PO TABS
1.0000 mg | ORAL_TABLET | Freq: Four times a day (QID) | ORAL | Status: DC | PRN
  Administered 2014-07-21: 1 mg via ORAL
  Filled 2014-07-20: qty 2

## 2014-07-20 MED ORDER — FOLIC ACID 1 MG PO TABS
1.0000 mg | ORAL_TABLET | Freq: Every day | ORAL | Status: DC
  Administered 2014-07-20 – 2014-07-23 (×4): 1 mg via ORAL
  Filled 2014-07-20 (×4): qty 1

## 2014-07-20 MED ORDER — ONDANSETRON HCL 4 MG/2ML IJ SOLN: 4.0000 mg | Freq: Four times a day (QID) | INTRAMUSCULAR | Status: DC | PRN

## 2014-07-20 MED ORDER — DOCUSATE SODIUM 100 MG PO CAPS
100.0000 mg | ORAL_CAPSULE | Freq: Two times a day (BID) | ORAL | Status: DC
  Administered 2014-07-20 – 2014-07-23 (×8): 100 mg via ORAL
  Filled 2014-07-20 (×9): qty 1

## 2014-07-20 MED ORDER — VANCOMYCIN HCL IN DEXTROSE 1-5 GM/200ML-% IV SOLN: 1000.0000 mg | Freq: Once | INTRAVENOUS | Status: DC

## 2014-07-20 MED ORDER — PIPERACILLIN-TAZOBACTAM 3.375 G IVPB
3.3750 g | Freq: Three times a day (TID) | INTRAVENOUS | Status: DC
  Administered 2014-07-20 – 2014-07-22 (×7): 3.375 g via INTRAVENOUS
  Filled 2014-07-20 (×8): qty 50

## 2014-07-20 MED ORDER — PREGABALIN 75 MG PO CAPS
75.0000 mg | ORAL_CAPSULE | Freq: Every morning | ORAL | Status: DC
  Administered 2014-07-20 – 2014-07-23 (×4): 75 mg via ORAL
  Filled 2014-07-20 (×4): qty 1

## 2014-07-20 MED ORDER — LORAZEPAM 0.5 MG PO TABS
0.5000 mg | ORAL_TABLET | Freq: Every day | ORAL | Status: DC
Start: 2014-07-20 — End: 2014-07-23
  Administered 2014-07-20 – 2014-07-22 (×3): 0.5 mg via ORAL
  Filled 2014-07-20 (×3): qty 1

## 2014-07-20 MED ORDER — CAPECITABINE 500 MG PO TABS: 1500.0000 mg | ORAL_TABLET | Freq: Two times a day (BID) | ORAL | Status: DC

## 2014-07-20 MED ORDER — LIDOCAINE-PRILOCAINE 2.5-2.5 % EX CREA: 1.0000 "application " | TOPICAL_CREAM | CUTANEOUS | Status: DC | PRN

## 2014-07-20 MED ORDER — PIPERACILLIN-TAZOBACTAM 3.375 G IVPB 30 MIN
3.3750 g | Freq: Once | INTRAVENOUS | Status: AC
  Administered 2014-07-20: 3.375 g via INTRAVENOUS
  Filled 2014-07-20: qty 50

## 2014-07-20 MED ORDER — FAMOTIDINE 20 MG PO TABS
20.0000 mg | ORAL_TABLET | Freq: Every day | ORAL | Status: DC
  Administered 2014-07-20 – 2014-07-23 (×4): 20 mg via ORAL
  Filled 2014-07-20 (×4): qty 1

## 2014-07-20 MED ORDER — HYDROCODONE-ACETAMINOPHEN 5-325 MG PO TABS: 1.0000 | ORAL_TABLET | Freq: Four times a day (QID) | ORAL | Status: DC | PRN

## 2014-07-20 MED ORDER — CARVEDILOL 12.5 MG PO TABS
12.5000 mg | ORAL_TABLET | Freq: Two times a day (BID) | ORAL | Status: DC
  Administered 2014-07-21 – 2014-07-23 (×5): 12.5 mg via ORAL
  Filled 2014-07-20 (×10): qty 1

## 2014-07-20 MED ORDER — IOHEXOL 300 MG/ML  SOLN
100.0000 mL | Freq: Once | INTRAMUSCULAR | Status: AC | PRN
  Administered 2014-07-20: 100 mL via INTRAVENOUS

## 2014-07-20 MED ORDER — POLYETHYLENE GLYCOL 3350 17 G PO PACK
17.0000 g | PACK | Freq: Two times a day (BID) | ORAL | Status: DC | PRN
  Filled 2014-07-20: qty 1

## 2014-07-20 MED ORDER — ACETAMINOPHEN 325 MG PO TABS
650.0000 mg | ORAL_TABLET | Freq: Once | ORAL | Status: AC | PRN
Start: 2014-07-20 — End: 2014-07-20
  Administered 2014-07-20: 650 mg via ORAL
  Filled 2014-07-20: qty 2

## 2014-07-20 MED ORDER — ADULT MULTIVITAMIN W/MINERALS CH
1.0000 | ORAL_TABLET | Freq: Every day | ORAL | Status: DC
  Administered 2014-07-20 – 2014-07-23 (×4): 1 via ORAL
  Filled 2014-07-20 (×4): qty 1

## 2014-07-20 MED ORDER — APIXABAN 2.5 MG PO TABS
2.5000 mg | ORAL_TABLET | Freq: Two times a day (BID) | ORAL | Status: DC
  Administered 2014-07-20 (×3): 2.5 mg via ORAL
  Filled 2014-07-20 (×5): qty 1

## 2014-07-20 MED ORDER — PROCHLORPERAZINE MALEATE 10 MG PO TABS: 5.0000 mg | ORAL_TABLET | Freq: Four times a day (QID) | ORAL | Status: DC | PRN

## 2014-07-20 MED ORDER — INSULIN GLARGINE 100 UNIT/ML ~~LOC~~ SOLN
3.0000 [IU] | Freq: Every day | SUBCUTANEOUS | Status: DC
  Administered 2014-07-20 – 2014-07-22 (×4): 3 [IU] via SUBCUTANEOUS
  Filled 2014-07-20 (×5): qty 0.03

## 2014-07-20 MED ORDER — BOOST PO LIQD
237.0000 mL | Freq: Two times a day (BID) | ORAL | Status: DC
  Administered 2014-07-20 – 2014-07-23 (×6): 237 mL via ORAL
  Filled 2014-07-20 (×9): qty 237

## 2014-07-20 MED ORDER — TEMAZEPAM 15 MG PO CAPS: 15.0000 mg | ORAL_CAPSULE | Freq: Every day | ORAL | Status: DC

## 2014-07-20 NOTE — Progress Notes (Signed)
Rx note regarding po Chemo:  Xeloda  Capecitabine (Xeloda) hold criteria  ANC < 1.5  Pltc < 100K  CrCl < 30 mL/min (contraindication)  SCr > 1.5x baseline (or >2 if baseline unknown)  Bilirubin > 1.5x ULN  Active infection  Acute coronary syndrome  Diarrhea - Grade 2 or higher  Hand / foot syndrome - Grade 2 or higher  Surgery planned this admission   Pt was admitted with sepsis.  Xeloda has been held per P&T policy please reorder when appropriate.  Thanks, Dorrene German 07/20/2014 6:16 AM

## 2014-07-20 NOTE — Progress Notes (Signed)
Goddaughter in to visit, she is [name] [name] [phone]

## 2014-07-20 NOTE — Progress Notes (Signed)
[facility] PROGRESS NOTE  AUBURN HERT WEX:[id number] DOB: 04/14/1923 DOA: 07/19/2014  PCP: Drema Pry, DO  Brief HPI: 81 year old Caucasian male who was recently hospitalized for similar issues with fever and was discharged on December 18, on Levaquin. He lives in a skilled nursing facility. He presented with a fever of 103F. He has a history of metastatic pancreatic cancer. He was admitted for further management.  Past medical history:  Past Medical History  Diagnosis Date  . Diverticulosis of colon (without mention of hemorrhage)   . Irritable bowel syndrome   . Personal history of malignant neoplasm of prostate     s/p  radioactive seed implants 2000  . Unspecified hereditary and idiopathic peripheral neuropathy   . Nonischemic cardiomyopathy cardiologist-  dr Aundra Dubin    EF 25%  per last echo 2012 and cardiologist note--  2007-  ef  40-45%;  2008- ef 35-40%;  2010- ef 30-35%;  2012- ef 25%  . Sinus bradycardia   . Hypertension   . Arthritis     hands  . Hyperlipidemia   . History of DVT of lower extremity     2002  ,   left leg  . History of adenomatous polyp of colon     2001  . History of basal cell carcinoma excision   . Anticoagulated on Coumadin   . Type 2 diabetes, diet controlled   . GERD (gastroesophageal reflux disease)   . LBBB (left bundle branch block)   . Wears glasses   . Wears hearing aid     BILATERAL  . Unsteady gait   . DDD (degenerative disc disease)     cervical and lumbar  . DJD (degenerative joint disease)     hips  . Lower urinary tract symptoms (LUTS)   . 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 Coronary artery disease     per cath 2002  non-obstructive cad  . Chronic systolic heart failure   . H/O fracture of nose     12-30-2013-  pt fell at home open nose bone fx    Consultants: None  Procedures: None  Antibiotics: Vancomycin and  Zosyn 12/31.  Subjective: Patient feels well this morning. He denies any cough. Denies any nausea, vomiting or diarrhea. Does have some abdominal discomfort at times. Denies any headaches. No skin rashes. No worsening joint pain other than usual. No sore throat  Objective: Vital Signs  Filed Vitals:   07/19/14 1950 07/19/14 2209 07/20/14 0019 07/20/14 0206  BP: 111/48 100/39 104/47 113/58  Pulse: 73 58 64 62  Temp: 100.2 F (37.9 C)  98.1 F (36.7 C) 98.4 F (36.9 C)  TempSrc: Oral  Oral Oral  Resp: 20 15 19 18   Weight:    73.483 kg (162 lb)  SpO2: 93% 98% 95% 96%   No intake or output data in the 24 hours ending 07/20/14 0814 Filed Weights   07/20/14 0206  Weight: 73.483 kg (162 lb)    General appearance: alert, cooperative, appears stated age and no distress Neck: no adenopathy, no carotid bruit, no JVD, supple, symmetrical, trachea midline, thyroid not enlarged, symmetric, no tenderness/mass/nodules and Soft and supple Resp: clear to auscultation bilaterally Cardio: regular rate and rhythm, S1, S2 normal, no murmur, click, rub or gallop GI: Abdomen is soft. Some fullness is appreciated in the upper abdomen. Mild tenderness without any rebound, rigidity or guarding. No  masses or obvious organomegaly. Bowel sounds are present. Extremities: Minimal edema in the lower extremities. No abnormal joint swelling. Skin: Skin color, texture, turgor normal. No rashes or lesions Neurologic: Alert and oriented 3. Cranial nerves II-12 intact. Motor strength equal bilateral upper and lower extremities.  Lab Results:  Basic Metabolic Panel:  Recent Labs Lab 07/19/14 2023 07/20/14 0500  NA 132* 137  K 3.8 4.0  CL 98 103  CO2 26 27  GLUCOSE 217* 122*  BUN 24* 21  CREATININE 1.09 0.99  CALCIUM 8.8 8.8   Liver Function Tests:  Recent Labs Lab 07/19/14 2023 07/20/14 0500  AST 76* 57*  ALT 48 40  ALKPHOS 278* 240*  BILITOT 0.6 0.9  PROT 6.7 6.1  ALBUMIN 3.4* 3.1*    CBC:  Recent Labs Lab 07/19/14 2023 07/20/14 0500  WBC 5.7 5.0  NEUTROABS 4.5  --   HGB 10.0* 9.6*  HCT 29.9* 29.3*  MCV 100.7* 101.7*  PLT 168 161   CBG:  Recent Labs Lab 07/20/14 0222 07/20/14 0731  GLUCAP 128* 131*    Studies/Results: Dg Chest 2 View  07/19/2014   CLINICAL DATA:  Fever  EXAM: CHEST  2 VIEW  COMPARISON:  07/04/2014  FINDINGS: There is mild cardiomegaly which is stable. Aortic and hilar contours are unchanged. Right IJ porta catheter, tip at the SVC level.  Streaky bibasilar lung opacities which favor atelectasis. No edema, effusion, or pneumothorax.  IMPRESSION: 1. Subsegmental bibasilar atelectasis. 2. Cardiomegaly without failure.   Electronically Signed   By: Jorje Guild M.D.   On: 07/19/2014 20:55    Medications:  Scheduled: . apixaban  2.5 mg Oral BID  . carvedilol  12.5 mg Oral BID WC  . docusate sodium  100 mg Oral BID  . famotidine  20 mg Oral Daily  . folic acid  1 mg Oral Daily  . insulin glargine  3 Units Subcutaneous QHS  . lactose free nutrition  237 mL Oral BID  . LORazepam  0.5 mg Oral QHS  . multivitamin with minerals  1 tablet Oral Daily  . piperacillin-tazobactam (ZOSYN)  IV  3.375 g Intravenous Q8H  . pravastatin  40 mg Oral QPM  . pregabalin  75 mg Oral q morning - 10a  . thiamine  100 mg Oral Daily  . vancomycin  750 mg Intravenous Q12H  . zolpidem  5 mg Oral QHS   Continuous: . sodium chloride 50 mL/hr at 07/20/14 3154   MGQ:[id number], bisacodyl, feeding supplement (GLUCERNA SHAKE), lidocaine-prilocaine, LORazepam, nitroGLYCERIN, ondansetron **OR** ondansetron (ZOFRAN) IV, polyethylene glycol, prochlorperazine  Assessment/Plan:  Active Problems:   Type 2 diabetes mellitus   HYPERTENSION, BENIGN   Pancreatic cancer   Fever    Fever of unknown origin  No obvious source identified. Influenza PCR will be ordered. He does not have a cough. Chest x-ray findings reviewed. Blood cultures have been obtained.  Continue with vancomycin and Zosyn for now. Due to mention of abdominal discomfort we will proceed with CT scan of the abdomen and pelvis. Can his cancer be causing this fever? We'll also check lower extremity Dopplers. He does have a known history of DVT.  History of metastatic Pancreatic Cancer His chemotherapy is on hold for now. Dr. Marin Olp is his oncologist. Abdominal pain could be due to his cancer. Follow-up on CT scan report.  History of Bilateral DVT Continue with his anticoagulation, Eliquis.  Essential Hypertension Monitor blood pressures closely. Continue home medications.  Type 2 Diabetes Mellitus Continue current medication  regimen. Initiate sliding scale coverage.  Macrocytic anemia Hemoglobin close to baseline. Continue to monitor.  DVT Prophylaxis: On full anticoagulation    Code Status: Full code  Family Communication: Discussed with the patient  Disposition Plan: Will likely return to skilled nursing facility when improved.    LOS: 1 day   [facility] Pager [phone] 07/20/2014, 8:14 AM  If 7PM-7AM, please contact night-coverage at [email or web address], password [facility]

## 2014-07-20 NOTE — Progress Notes (Signed)
Bilateral lower extremity venous duplex completed.  Right:  No evidence of DVT, superficial thrombosis, or Baker's cyst.  Left: DVT noted in the proximal femoral vein.  No evidence of superficial thrombosis.  No Baker's cyst.

## 2014-07-20 NOTE — Evaluation (Signed)
Physical Therapy Evaluation Patient Details Name: [name] [name] MRN: [id number] DOB: [birth year] Today's Date: 07/20/2014   History of Present Illness  81 yo male with h/o stage 4 pancreatic cancer with liver mets, DVT, bil TKA, chronic systolic heart failure/cardiomyopathy admitted for fever from SNF.  Clinical Impression  Pt admitted with above diagnosis. Pt currently with functional limitations due to the deficits listed below (see PT Problem List).  Pt will benefit from skilled PT to increase their independence and safety with mobility to allow discharge to the venue listed below.       Follow Up Recommendations SNF (states he was not receiving PT at SNF, will defer PT needs to SNF)    Equipment Recommendations  None recommended by PT    Recommendations for Other Services       Precautions / Restrictions Precautions Precautions: Fall      Mobility  Bed Mobility Overal bed mobility: Needs Assistance Bed Mobility: Supine to Sit;Sit to Supine     Supine to sit: Supervision Sit to supine: Supervision   General bed mobility comments: supervision for lines  Transfers Overall transfer level: Needs assistance Equipment used: Rolling walker (2 wheeled) Transfers: Sit to/from Stand Sit to Stand: Min assist         General transfer comment: assist to rise and steady  Ambulation/Gait Ambulation/Gait assistance: Min guard Ambulation Distance (Feet): 200 Feet Assistive device: Rolling walker (2 wheeled) Gait Pattern/deviations: Step-through pattern;Decreased stride length;Trunk flexed     General Gait Details: pt used RW for ambulation for safety (admitted for fever with recent admission for same)  Stairs            Wheelchair Mobility    Modified Rankin (Stroke Patients Only)       Balance                                             Pertinent Vitals/Pain Pain Assessment: No/denies pain    Home Living Family/patient expects to be  discharged to:: Skilled nursing facility                 Additional Comments: pt reports being at SNF while getting treatment for cancer however was home prior to this    Prior Function Level of Independence: Independent with assistive device(s)         Comments: states he uses RW for mobility at SNF however reports Independent with ADLs     Hand Dominance        Extremity/Trunk Assessment               Lower Extremity Assessment: Generalized weakness         Communication   Communication: No difficulties  Cognition Arousal/Alertness: Awake/alert Behavior During Therapy: WFL for tasks assessed/performed Overall Cognitive Status: Within Functional Limits for tasks assessed                      General Comments      Exercises        Assessment/Plan    PT Assessment Patient needs continued PT services  PT Diagnosis Difficulty walking   PT Problem List Decreased strength;Decreased mobility;Decreased balance;Decreased knowledge of use of DME  PT Treatment Interventions DME instruction;Gait training;Functional mobility training;Therapeutic activities;Therapeutic exercise;Patient/family education;Balance training   PT Goals (Current goals can be found in the Care Plan section) Acute Rehab PT  Goals PT Goal Formulation: With patient Time For Goal Achievement: 07/27/14 Potential to Achieve Goals: Good    Frequency Min 3X/week   Barriers to discharge        Co-evaluation               End of Session   Activity Tolerance: Patient tolerated treatment well Patient left: in bed;with call bell/phone within reach;with bed alarm set           Time: 1435-1452 PT Time Calculation (min) (ACUTE ONLY): 17 min   Charges:   PT Evaluation $Initial PT Evaluation Tier I: 1 Procedure PT Treatments $Gait Training: 8-22 mins   PT G Codes:        [name] [name],KATHrine E 07/20/2014, 3:23 PM Carmelia Bake, PT, DPT 07/20/2014 Pager: [phone]

## 2014-07-20 NOTE — H&P (Signed)
[facility] History and Physical  [name] [name] KZS:[id number] DOB: Feb 10, 1976 DOA: 07/19/2014  Referring physician: Lajean Saver, MD PCP: Drema Pry, DO   Chief Complaint: Fever  HPI: [name] [name] is a 81 y.o. male presents with fevers. Patient is very hard of hearing and not a good historian. Patient apparently was brought in due to fever of 103F. He has a history of metastatic pancreatic cancer. Patient has stage 4 disease. Patient has been having fevers on and off. He was recently admitted on 12/14 with fevers at the facility. At that time he was admitted and sent back on the 18th of December. Based on the discharge note he did not have a clear source of the fevers. In the ED he has been cultured and will be admitted for further workup.   Review of Systems:  Patient is not able to provide a ROS  Past Medical History  Diagnosis Date  . Diverticulosis of colon (without mention of hemorrhage)   . Irritable bowel syndrome   . Personal history of malignant neoplasm of prostate     s/p  radioactive seed implants 2000  . Unspecified hereditary and idiopathic peripheral neuropathy   . Nonischemic cardiomyopathy cardiologist-  dr Aundra Dubin    EF 25%  per last echo 2012 and cardiologist note--  2007-  ef  40-45%;  2008- ef 35-40%;  2010- ef 30-35%;  2012- ef 25%  . Sinus bradycardia   . Hypertension   . Arthritis     hands  . Hyperlipidemia   . History of DVT of lower extremity     2002  ,   left leg  . History of adenomatous polyp of colon     2001  . History of basal cell carcinoma excision   . Anticoagulated on Coumadin   . Type 2 diabetes, diet controlled   . GERD (gastroesophageal reflux disease)   . LBBB (left bundle branch block)   . Wears glasses   . Wears hearing aid     BILATERAL  . Unsteady gait   . DDD (degenerative disc disease)     cervical and lumbar  . DJD (degenerative joint disease)     hips  . Lower urinary tract symptoms (LUTS)   . Bladder cancer dx   march 2014  papillary urothelial carcinoma    s/p turbt with chemo instillation (urologist--  dr Diona Fanti)  . Coronary artery disease     per cath 2002  non-obstructive cad  . Chronic systolic heart failure   . H/O fracture of nose     12-30-2013-  pt fell at home open nose bone fx   Past Surgical History  Procedure Laterality Date  . Knee arthroscopy Left 1991  . Ercp with sphincterotomy and stone removal  05/ 1993  &  05-11-2006  . Total knee arthroplasty  10/05/2011    Procedure: TOTAL KNEE ARTHROPLASTY;  Surgeon: Gearlean Alf, MD;  Location: [facility];  Service: Orthopedics;  Laterality: Right;  . Transurethral resection of bladder tumor N/A 09/30/2012    Procedure: TRANSURETHRAL RESECTION OF BLADDER TUMOR (TURBT);  Surgeon: Franchot Gallo, MD;  Location: [facility];  Service: Urology;  Laterality: N/A;  WITH MITOMYCIN INSTILLATION   . Eus N/A 01/04/2014    Procedure: ESOPHAGEAL ENDOSCOPIC ULTRASOUND (EUS) RADIAL;  Surgeon: [name], MD;  Location: [facility];  Service: Endoscopy;  Laterality: N/A;  . Radioactive prostate seed implants  01/ 2000  . Mohs surgery  03/ 2009  LEFT EAR BASAL CELL CARCINOMA  . Tonsillectomy  AS CHILD  . Cholecystectomy open  1985  . Appendectomy  child  . Inguinal hernia repair Bilateral 1998    recurrent RIH  w/ repair 06-21-2006  . Total knee arthroplasty Left 02-21-2007    AND 04-21-2007  CLOSED MANIPULATION  . Cataract extraction w/ intraocular lens implant Right 2004  . Lumbar spine surgery  X2  . Cardiac catheterization  06-14-2001  dr Darnell Level brodie    non-obstructive CAD/  LM 30%,  pLAD 50%,  mLAD 30%,  RCA  &  CFX  irregularites ,  global hypokinesis,  ef 40%  . Cardiovascular stress test  02-07-2007   dr Darnell Level brodie    negative for ischemia,  low risk myoview perfusion study  . Transthoracic echocardiogram  01-28-2011    moderate LVH/  ef 25%/  diffuse hypokinesis/  mild LAE/  mild TR/  trivial AR & MR  . Cystoscopy w/ retrogrades  Bilateral 03/09/2014    Procedure: CYSTOSCOPY WITH BILATERAL RETROGRADE PYELOGRAM BLADDER BX AND RENAL WASHINGS.;  Surgeon: Jorja Loa, MD;  Location: [facility];  Service: Urology;  Laterality: Bilateral;  . Eus N/A 04/05/2014    Procedure: UPPER ENDOSCOPIC ULTRASOUND (EUS) LINEAR;  Surgeon: [name], MD;  Location: [facility];  Service: Endoscopy;  Laterality: N/A;  radial linear  . Endoscopic retrograde cholangiopancreatography (ercp) with propofol N/A 04/12/2014    Procedure: ENDOSCOPIC RETROGRADE CHOLANGIOPANCREATOGRAPHY (ERCP) WITH PROPOFOL;  Surgeon: [name], MD;  Location: [facility];  Service: Endoscopy;  Laterality: N/A;  metal stent  . Biliary stent placement N/A 04/12/2014    Procedure: BILIARY STENT PLACEMENT;  Surgeon: [name], MD;  Location: [facility];  Service: Endoscopy;  Laterality: N/A;   Social History:  reports that he quit smoking about 31 years ago. His smoking use included Cigarettes. He started smoking about 60 years ago. He has a 10 pack-year smoking history. He has never used smokeless tobacco. He reports that he does not drink alcohol or use illicit drugs.  No Known Allergies  Family History  Problem Relation Age of Onset  . Throat cancer Brother   . Coronary artery disease Father     deceased     Prior to Admission medications   Medication Sig Start Date End Date Taking? Authorizing Provider  acetaminophen (TYLENOL) 325 MG tablet Take 650 mg by mouth once as needed for fever.   Yes Historical Provider, MD  apixaban (ELIQUIS) 2.5 MG TABS tablet Take 2.5 mg by mouth 2 (two) times daily.   Yes Historical Provider, MD  bisacodyl (DULCOLAX) 10 MG suppository Place 1 suppository (10 mg total) rectally daily as needed for moderate constipation. 04/03/14  Yes Christina P Rama, MD  capecitabine (XELODA) 500 MG tablet Take 3 tablets (1,500 mg total) by mouth 2 (two) times daily after a meal. TAKES 3 TABS (1500) MG BID  ///   ON 14 DAYS OFF 7 DAYS FOR 3 CYCLES.   STARTED 06/06/14 07/06/14  Yes Eugenie Filler, MD  carvedilol (COREG) 12.5 MG tablet Take 12.5 mg by mouth 2 (two) times daily with a meal.   Yes Historical Provider, MD  docusate sodium 100 MG CAPS Take 100 mg by mouth 2 (two) times daily. 04/03/14  Yes Christina P Rama, MD  feeding supplement, GLUCERNA SHAKE, (GLUCERNA SHAKE) LIQD Take 237 mLs by mouth 2 (two) times daily between meals as needed (If pt with <50% meal completion). 07/06/14  Yes  Eugenie Filler, MD  HYDROcodone-acetaminophen [facility]) 10-325 MG per tablet Take one tablet by mouth every 4 hours as needed for pain 07/06/14  Yes Eugenie Filler, MD  insulin glargine (LANTUS) 100 UNIT/ML injection Inject 3 Units into the skin at bedtime.   Yes Historical Provider, MD  lactose free nutrition (BOOST) LIQD Take 237 mLs by mouth 2 (two) times daily.    Yes Historical Provider, MD  lidocaine-prilocaine (EMLA) cream Apply 1 application topically as needed. Patient taking differently: Apply 1 application topically as needed (port access).  04/16/14  Yes Volanda Napoleon, MD  LORazepam (ATIVAN) 0.5 MG tablet Take 2 tablets (1 mg total) by mouth every 6 (six) hours as needed (nausea/vomiting). Patient taking differently: Take 0.5-1 mg by mouth See admin instructions.  07/06/14  Yes Eugenie Filler, MD  nitroGLYCERIN (NITROSTAT) 0.4 MG SL tablet Place 1 tablet (0.4 mg total) under the tongue every 5 (five) minutes as needed for chest pain. 11/22/13  Yes Thompson Grayer, MD  Omega-3 Fatty Acids (FISH OIL) 1000 MG CAPS Take 1,000 mg by mouth every morning.    Yes Historical Provider, MD  ondansetron (ZOFRAN) 8 MG tablet Take by mouth 2 (two) times daily as needed for nausea or vomiting.    Yes Historical Provider, MD  oxyCODONE (OXY IR/ROXICODONE) 5 MG immediate release tablet Take 5 mg by mouth 2 (two) times daily.   Yes Historical Provider, MD  polyethylene glycol (MIRALAX / GLYCOLAX) packet Take 17 g by mouth 2  (two) times daily as needed for mild constipation.   Yes Historical Provider, MD  polyvinyl alcohol (LIQUIFILM TEARS) 1.4 % ophthalmic solution Place 1 drop into both eyes 4 (four) times daily.   Yes Historical Provider, MD  pravastatin (PRAVACHOL) 40 MG tablet Take 40 mg by mouth every evening.    Yes Historical Provider, MD  pregabalin (LYRICA) 75 MG capsule Take 75 mg by mouth every morning.    Yes Historical Provider, MD  prochlorperazine (COMPAZINE) 5 MG tablet Take 5 mg by mouth every 6 (six) hours as needed for nausea or vomiting.   Yes Historical Provider, MD  ranitidine (ZANTAC) 150 MG tablet Take 150 mg by mouth 2 (two) times daily.   Yes Historical Provider, MD  temazepam (RESTORIL) 15 MG capsule Take 15 mg by mouth at bedtime.    Yes Historical Provider, MD   Physical Exam: Filed Vitals:   07/19/14 1950 07/19/14 2209 07/20/14 0019  BP: 111/48 100/39 104/47  Pulse: 73 58 64  Temp: 100.2 F (37.9 C)  98.1 F (36.7 C)  TempSrc: Oral  Oral  Resp: 20 15 19   SpO2: 93% 98% 95%    Wt Readings from Last 3 Encounters:  07/11/14 74.39 kg (164 lb)  07/06/14 73.936 kg (163 lb)  06/28/14 76.658 kg (169 lb)    General:  Appears calm and comfortable Eyes: PERRL, normal lids, irises & conjunctiva ENT: grossly normal hearing, lips & tongue Neck: no LAD, masses or thyromegaly Cardiovascular: RRR, no m/r/g. No LE edema. Respiratory: CTA bilaterally, no w/r/r. Normal respiratory effort. Abdomen: soft, ntnd Skin: no rash or induration seen on limited exam Musculoskeletal: grossly normal tone BUE/BLE Psychiatric: grossly normal mood and affect, speech fluent and appropriate Neurologic: grossly non-focal.          Labs on Admission:  Basic Metabolic Panel:  Recent Labs Lab 07/19/14 2023  NA 132*  K 3.8  CL 98  CO2 26  GLUCOSE 217*  BUN 24*  CREATININE  1.09  CALCIUM 8.8   Liver Function Tests:  Recent Labs Lab 07/19/14 2023  AST 76*  ALT 48  ALKPHOS 278*  BILITOT  0.6  PROT 6.7  ALBUMIN 3.4*   No results for input(s): LIPASE, AMYLASE in the last 168 hours. No results for input(s): AMMONIA in the last 168 hours. CBC:  Recent Labs Lab 07/19/14 2023  WBC 5.7  NEUTROABS 4.5  HGB 10.0*  HCT 29.9*  MCV 100.7*  PLT 168   Cardiac Enzymes: No results for input(s): CKTOTAL, CKMB, CKMBINDEX, TROPONINI in the last 168 hours.  BNP (last 3 results) No results for input(s): PROBNP in the last 8760 hours. CBG: No results for input(s): GLUCAP in the last 168 hours.  Radiological Exams on Admission: Dg Chest 2 View  07/19/2014   CLINICAL DATA:  Fever  EXAM: CHEST  2 VIEW  COMPARISON:  07/04/2014  FINDINGS: There is mild cardiomegaly which is stable. Aortic and hilar contours are unchanged. Right IJ porta catheter, tip at the SVC level.  Streaky bibasilar lung opacities which favor atelectasis. No edema, effusion, or pneumothorax.  IMPRESSION: 1. Subsegmental bibasilar atelectasis. 2. Cardiomegaly without failure.   Electronically Signed   By: Jorje Guild M.D.   On: 07/19/2014 20:55    Assessment/Plan Active Problems:   Type 2 diabetes mellitus   HYPERTENSION, BENIGN   Pancreatic cancer   Fever   1. Fevers -will be admitted for further workup -will get cultures done -start on emperic antibiotics  2. Pancreatic Cancer -will need oncology follow up  3. Hypertension -monitor pressures  4. Type 2 Diabetes Mellitus -will monitor FSBS -cover with insulin as needed    Code Status: Full Code (must indicate code status--if unknown or must be presumed, indicate so) DVT Prophylaxis:Heparin Family Communication: None (indicate person spoken with, if applicable, with phone number if by telephone) Disposition Plan: Home (indicate anticipated LOS)  Time spent: 74min  Vika Buske A [facility] Pager [phone]

## 2014-07-20 NOTE — Plan of Care (Signed)
Problem: Phase I Progression Outcomes Goal: Voiding-avoid urinary catheter unless indicated Outcome: Progressing Incontinent, doesn't know when he has to go,condom cath on

## 2014-07-20 NOTE — Progress Notes (Signed)
Clinical Social Work Department BRIEF PSYCHOSOCIAL ASSESSMENT 07/20/2014  Patient:  [name] [name], [name] [name]     Account Number:  [id number]     Admit date:  07/19/2014  Clinical Social Worker:  Maryln Manuel  Date/Time:  07/20/2014 10:40 AM  Referred by:  Physician  Date Referred:  07/20/2014 Referred for  SNF Placement   Other Referral:   Interview type:  Patient Other interview type:   and patient HCPOA, [place]    PSYCHOSOCIAL DATA Living Status:  FACILITY Admitted from facility:  [place] Level of care:  [place] Primary support name:  Lyn Smither/HCPOA/[phone] Primary support relationship to patient:  FRIEND Degree of support available:   strong    CURRENT CONCERNS Current Concerns  Post-Acute Placement   Other Concerns:    SOCIAL WORK ASSESSMENT / PLAN CSW received referral that pt admitted from [facility].    CSW met with pt and pt friend/HCPOA, Lyn Smith at bedside. CSW introduced self and explained role. Pt confirmed that he is a resident of [facility]. Pt stated that he plans to return when medically ready and pt HCPOA confirmed plan as well. CSW discussed that CSW will updated [place] during hospitalization and assist with pt transition back to [facility] when pt medically stable.    CSW completed FL2 and sent clinicals via [facility] to [place]. CSW contacted [place] and left message with admissions as admissions was out of the office today given the new year.    CSW to continue to follow to provide support and assist with pt transition back to [facility] when pt medically stable for discharge.   Assessment/plan status:  Psychosocial Support/Ongoing Assessment of Needs Other assessment/ plan:   discharge planning   Information/referral to community resources:   Referral back to [facility] [place]    PATIENT'S/FAMILY'S RESPONSE TO PLAN OF  CARE: Pt alert and oriented x 4. Pt pleasant and actively involved in pt care. Pt plans to return to [facility]. Pt request for CSW to keep pt HCPOA updated during hospitalization. Pt has been at [facility] for approximately 4 months.    Alison Murray, MSW, [place] Work [phone]

## 2014-07-20 NOTE — Consult Note (Signed)
i saw and examined Mr, [name] [name].  I will leave a full note tomorrow.  He looks good.  He is eating.  NO idea why he had the fever.  ?? Reaction to Xeloda.  If so, this is very unusual..  The CT scan, to me, looks stable.  Thanks for helping out!!!!  [name]

## 2014-07-20 NOTE — Progress Notes (Signed)
ANTIBIOTIC CONSULT NOTE - INITIAL  Pharmacy Consult for Zosyn and Vancomycin Indication: Sepsis  No Known Allergies  Patient Measurements:   Wt=74 kg  Vital Signs: Temp: 98.4 F (36.9 C) (01/01 0206) Temp Source: Oral (01/01 0206) BP: 113/58 mmHg (01/01 0206) Pulse Rate: 62 (01/01 0206) Intake/Output from previous day:   Intake/Output from this shift:    Labs:  Recent Labs  07/19/14 2023  WBC 5.7  HGB 10.0*  PLT 168  CREATININE 1.09   Estimated Creatinine Clearance: 47.4 mL/min (by C-G formula based on Cr of 1.09). No results for input(s): VANCOTROUGH, VANCOPEAK, VANCORANDOM, GENTTROUGH, GENTPEAK, GENTRANDOM, TOBRATROUGH, TOBRAPEAK, TOBRARND, AMIKACINPEAK, AMIKACINTROU, AMIKACIN in the last 72 hours.   Microbiology: Recent Results (from the past 720 hour(s))  Culture, blood (routine x 2)     Status: None   Collection Time: 07/02/14  4:43 PM  Result Value Ref Range Status   Specimen Description BLOOD BLOOD LEFT FOREARM  Final   Special Requests BOTTLES DRAWN AEROBIC AND ANAEROBIC 5ML  Final   Culture  Setup Time   Final    07/02/2014 20:43 Performed at [place]   Final    NO GROWTH 5 DAYS Performed at [facility]    Report Status 07/08/2014 FINAL  Final  Culture, blood (routine x 2)     Status: None   Collection Time: 07/02/14  5:07 PM  Result Value Ref Range Status   Specimen Description BLOOD BLOOD RIGHT FOREARM  Final   Special Requests BOTTLES DRAWN AEROBIC AND ANAEROBIC 3ML  Final   Culture  Setup Time   Final    07/02/2014 20:43 Performed at [facility]    Culture   Final    NO GROWTH 5 DAYS Performed at [facility]    Report Status 07/08/2014 FINAL  Final  Urine culture     Status: None   Collection Time: 07/02/14  5:10 PM  Result Value Ref Range Status   Specimen Description URINE, CLEAN CATCH  Final   Special Requests NONE  Final   Culture  Setup Time   Final    07/02/2014 20:54 Performed  at [place] Performed at [facility]   Final   Culture NO GROWTH Performed at [facility]   Final   Report Status 07/03/2014 FINAL  Final  MRSA PCR Screening     Status: None   Collection Time: 07/02/14 10:04 PM  Result Value Ref Range Status   MRSA by PCR NEGATIVE NEGATIVE Final    Comment:        The GeneXpert MRSA Assay (FDA approved for NASAL specimens only), is one component of a comprehensive MRSA colonization surveillance program. It is not intended to diagnose MRSA infection nor to guide or monitor treatment for MRSA infections.     Medical History: Past Medical History  Diagnosis Date  . Diverticulosis of colon (without mention of hemorrhage)   . Irritable bowel syndrome   . Personal history of malignant neoplasm of prostate     s/p  radioactive seed implants 2000  . Unspecified hereditary and idiopathic peripheral neuropathy   . Nonischemic cardiomyopathy cardiologist-  dr Aundra Dubin    EF 25%  per last echo 2012 and cardiologist note--  2007-  ef  40-45%;  2008- ef 35-40%;  2010- ef 30-35%;  2012- ef 25%  . Sinus bradycardia   . Hypertension   . Arthritis  hands  . Hyperlipidemia   . History of DVT of lower extremity     2002  ,   left leg  . History of adenomatous polyp of colon     2001  . History of basal cell carcinoma excision   . Anticoagulated on Coumadin   . Type 2 diabetes, diet controlled   . GERD (gastroesophageal reflux disease)   . LBBB (left bundle branch block)   . Wears glasses   . Wears hearing aid     BILATERAL  . Unsteady gait   . DDD (degenerative disc disease)     cervical and lumbar  . DJD (degenerative joint disease)     hips  . Lower urinary tract symptoms (LUTS)   . 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 Coronary artery disease     per cath 2002  non-obstructive cad  .  Chronic systolic heart failure   . H/O fracture of nose     12-30-2013-  pt fell at home open nose bone fx    Medications:  Scheduled:  . apixaban  2.5 mg Oral BID  . capecitabine  1,500 mg Oral BID PC  . carvedilol  12.5 mg Oral BID WC  . docusate sodium  100 mg Oral BID  . famotidine  20 mg Oral Daily  . folic acid  1 mg Oral Daily  . insulin glargine  3 Units Subcutaneous QHS  . lactose free nutrition  237 mL Oral BID  . LORazepam  0.5 mg Oral QHS  . multivitamin with minerals  1 tablet Oral Daily  . piperacillin-tazobactam (ZOSYN)  IV  3.375 g Intravenous Q8H  . pravastatin  40 mg Oral QPM  . pregabalin  75 mg Oral q morning - 10a  . thiamine  100 mg Oral Daily  . vancomycin  750 mg Intravenous Q12H  . zolpidem  5 mg Oral QHS   Infusions:  . sodium chloride     Assessment: 74 yoM w/ hx of stage IV pancreatic Ca presents with fever=103.   Goal of Therapy:  Vancomycin trough level 15-20 mcg/ml  Plan:   Zosyn 3.375 Gm IV q8h EI  Vancomycin 750mg  IV q12h  F/U Scr/levels/cultures as needed  Lawana Pai R 07/20/2014,2:21 AM

## 2014-07-21 ENCOUNTER — Encounter (HOSPITAL_COMMUNITY): Payer: Self-pay | Admitting: *Deleted

## 2014-07-21 DIAGNOSIS — I82402 Acute embolism and thrombosis of unspecified deep veins of left lower extremity: Secondary | ICD-10-CM

## 2014-07-21 DIAGNOSIS — I10 Essential (primary) hypertension: Secondary | ICD-10-CM

## 2014-07-21 LAB — CBC
HCT: 30.6 % — ABNORMAL LOW (ref 39.0–52.0)
HEMOGLOBIN: 10.3 g/dL — AB (ref 13.0–17.0)
MCH: 34.2 pg — ABNORMAL HIGH (ref 26.0–34.0)
MCHC: 33.7 g/dL (ref 30.0–36.0)
MCV: 101.7 fL — ABNORMAL HIGH (ref 78.0–100.0)
PLATELETS: 179 10*3/uL (ref 150–400)
RBC: 3.01 MIL/uL — ABNORMAL LOW (ref 4.22–5.81)
RDW: 17.1 % — ABNORMAL HIGH (ref 11.5–15.5)
WBC: 4 10*3/uL (ref 4.0–10.5)

## 2014-07-21 LAB — GLUCOSE, CAPILLARY
GLUCOSE-CAPILLARY: 138 mg/dL — AB (ref 70–99)
Glucose-Capillary: 185 mg/dL — ABNORMAL HIGH (ref 70–99)
Glucose-Capillary: 159 mg/dL — ABNORMAL HIGH (ref 70–99)
Glucose-Capillary: 160 mg/dL — ABNORMAL HIGH (ref 70–99)

## 2014-07-21 LAB — COMPREHENSIVE METABOLIC PANEL
ALT: 30 U/L (ref 0–53)
AST: 32 U/L (ref 0–37)
Albumin: 3 g/dL — ABNORMAL LOW (ref 3.5–5.2)
Alkaline Phosphatase: 194 U/L — ABNORMAL HIGH (ref 39–117)
Anion gap: 6 (ref 5–15)
BUN: 21 mg/dL (ref 6–23)
CO2: 28 mmol/L (ref 19–32)
Calcium: 7.5 mg/dL — ABNORMAL LOW (ref 8.4–10.5)
Chloride: 103 mEq/L (ref 96–112)
Creatinine, Ser: 1.06 mg/dL (ref 0.50–1.35)
GFR calc Af Amer: 70 mL/min — ABNORMAL LOW (ref >90)
GFR calc non Af Amer: 60 mL/min — ABNORMAL LOW (ref >90)
GLUCOSE: 132 mg/dL — AB (ref 70–99)
POTASSIUM: 4.6 mmol/L (ref 3.5–5.1)
Sodium: 137 mmol/L (ref 135–145)
TOTAL PROTEIN: 6.2 g/dL (ref 6.0–8.3)
Total Bilirubin: 0.9 mg/dL (ref 0.3–1.2)

## 2014-07-21 MED ORDER — APIXABAN 5 MG PO TABS
5.0000 mg | ORAL_TABLET | Freq: Two times a day (BID) | ORAL | Status: DC
  Administered 2014-07-21 – 2014-07-23 (×5): 5 mg via ORAL
  Filled 2014-07-21 (×6): qty 1

## 2014-07-21 NOTE — Progress Notes (Signed)
Pt was on Apixaban 2.5mg  BID PTA for h/o recurrent DVT with the latest one on 10/15. Dr. Aundra Dubin noted on 06/28/14 that pt was on reduced dose of Apixaban. Talked to Dr. Maryland Pink and he wants to increase Apixaban dose to 5 mg BID since patient had an acute DVT on 10/15.  Thanks, Garnet Sierras, PharmD 07/21/2014

## 2014-07-21 NOTE — Progress Notes (Signed)
[facility] PROGRESS NOTE  [name] [name] XIP:[id number] DOB: 1922/07/28 DOA: 07/19/2014  PCP: Drema Pry, DO  Brief HPI: 81 year old Caucasian male who was recently hospitalized for similar issues with fever and was discharged on December 18, on Levaquin. He lives in a skilled nursing facility. He presented with a fever of 103F. He has a history of metastatic pancreatic cancer. He was admitted for further management.  Past medical history:  Past Medical History  Diagnosis Date  . Diverticulosis of colon (without mention of hemorrhage)   . Irritable bowel syndrome   . Personal history of malignant neoplasm of prostate     s/p  radioactive seed implants 2000  . Unspecified hereditary and idiopathic peripheral neuropathy   . Nonischemic cardiomyopathy cardiologist-  dr Aundra Dubin    EF 25%  per last echo 2012 and cardiologist note--  2007-  ef  40-45%;  2008- ef 35-40%;  2010- ef 30-35%;  2012- ef 25%  . Sinus bradycardia   . Hypertension   . Arthritis     hands  . Hyperlipidemia   . History of DVT of lower extremity     2002  ,   left leg  . History of adenomatous polyp of colon     2001  . History of basal cell carcinoma excision   . Anticoagulated on Coumadin   . Type 2 diabetes, diet controlled   . GERD (gastroesophageal reflux disease)   . LBBB (left bundle branch block)   . Wears glasses   . Wears hearing aid     BILATERAL  . Unsteady gait   . DDD (degenerative disc disease)     cervical and lumbar  . DJD (degenerative joint disease)     hips  . Lower urinary tract symptoms (LUTS)   . 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 Coronary artery disease     per cath 2002  non-obstructive cad  . Chronic systolic heart failure   . H/O fracture of nose     12-30-2013-  pt fell at home open nose bone fx    Consultants: None  Procedures: None  Antibiotics: Vancomycin and  Zosyn 12/31.  Subjective: Patient continues to feel better. He has a slight cough but denies any expectoration. No chest pain or shortness of breath. No nausea, vomiting.   Objective: Vital Signs  Filed Vitals:   07/20/14 1728 07/20/14 1748 07/20/14 2049 07/21/14 0447  BP: 116/50 116/50 107/53 110/56  Pulse: 54 54 57 51  Temp: 98.3 F (36.8 C)  98.3 F (36.8 C) 98.1 F (36.7 C)  TempSrc: Oral  Oral Oral  Resp: 16  18 16   Weight:      SpO2: 100%  96% 100%    Intake/Output Summary (Last 24 hours) at 07/21/14 0813 Last data filed at 07/21/14 0447  Gross per 24 hour  Intake    710 ml  Output   2900 ml  Net  -2190 ml   Filed Weights   07/20/14 0206  Weight: 73.483 kg (162 lb)    General appearance: alert, cooperative, appears stated age and no distress Resp: clear to auscultation bilaterally Cardio: regular rate and rhythm, S1, S2 normal, no murmur, click, rub or gallop GI: Abdomen is soft. Some fullness is appreciated in the upper abdomen. Mild tenderness without any rebound, rigidity or guarding. No masses or obvious organomegaly. Bowel sounds are present. Extremities: Minimal  edema in the lower extremities. No abnormal joint swelling. Neurologic: Alert and oriented 3. Cranial nerves II-12 intact. Motor strength equal bilateral upper and lower extremities.  Lab Results:  Basic Metabolic Panel:  Recent Labs Lab 07/19/14 2023 07/20/14 0500 07/21/14 0530  NA 132* 137 137  K 3.8 4.0 4.6  CL 98 103 103  CO2 26 27 28   GLUCOSE 217* 122* 132*  BUN 24* 21 21  CREATININE 1.09 0.99 1.06  CALCIUM 8.8 8.8 7.5*   Liver Function Tests:  Recent Labs Lab 07/19/14 2023 07/20/14 0500 07/21/14 0530  AST 76* 57* 32  ALT 48 40 30  ALKPHOS 278* 240* 194*  BILITOT 0.6 0.9 0.9  PROT 6.7 6.1 6.2  ALBUMIN 3.4* 3.1* 3.0*   CBC:  Recent Labs Lab 07/19/14 2023 07/20/14 0500 07/21/14 0530  WBC 5.7 5.0 4.0  NEUTROABS 4.5  --   --   HGB 10.0* 9.6* 10.3*  HCT 29.9*  29.3* 30.6*  MCV 100.7* 101.7* 101.7*  PLT 168 161 179   CBG:  Recent Labs Lab 07/20/14 0222 07/20/14 0731 07/20/14 1143 07/20/14 1756 07/20/14 2052  GLUCAP 128* 131* 130* 183* 142*    Studies/Results: Dg Chest 2 View  07/19/2014   CLINICAL DATA:  Fever  EXAM: CHEST  2 VIEW  COMPARISON:  07/04/2014  FINDINGS: There is mild cardiomegaly which is stable. Aortic and hilar contours are unchanged. Right IJ porta catheter, tip at the SVC level.  Streaky bibasilar lung opacities which favor atelectasis. No edema, effusion, or pneumothorax.  IMPRESSION: 1. Subsegmental bibasilar atelectasis. 2. Cardiomegaly without failure.   Electronically Signed   By: Jorje Guild M.D.   On: 07/19/2014 20:55   Ct Abdomen Pelvis W Contrast  07/20/2014   CLINICAL DATA:  abd pain, hx of HTN, diabetes, bladder ca-2014 with chemo instillation, fever of 103 with unknown origion with hx of pancreatic ca  EXAM: CT ABDOMEN AND PELVIS WITH CONTRAST  TECHNIQUE: Multidetector CT imaging of the abdomen and pelvis was performed using the standard protocol following bolus administration of intravenous contrast.  CONTRAST:  163mL OMNIPAQUE IOHEXOL 300 MG/ML  SOLN  COMPARISON:  For 06/04/2014  FINDINGS: Tiny bilateral pleural effusions. Coarse subsegmental atelectasis versus early airspace opacities in the posterior basal segments of both lower lobes. Coronary calcifications. Patent metallic biliary stent as evidenced by pneumobilia. There is a poorly marginated low-attenuation region in hepatic segment 7 measuring approximately 2.6 x 5 cm, previously 2.8 x 4.5 cm. There is a 2 cm segment 8 lesion without convincing interval change. There is a 14x 20 mm lesion in hepatic segment 4 image 23 which was less conspicuous on the prior study. Unremarkable spleen, adrenal glands, kidneys. There is fatty infiltration of the pancreas with a poorly marginated mass in the pancreatic body partially encasing the proximal SMA and SMV.  Stomach is  physiologically distended. Small bowel and colon are nondilated. Innumerable sigmoid diverticula without significant adjacent inflammatory/ edematous change. Urinary bladder incompletely distended. Multiple metallic seeds around the prostate. No ascites. No free air. Aortoiliac atheromatous calcifications. Spondylitic changes throughout the lumbar spine.  IMPRESSION: 1. New tiny pleural effusions with adjacent atelectasis/ infiltrate. 2. Stable poorly marginated hepatic lesions suggesting metastatic disease. 3. Little change in pancreatic mass with patent metallic biliary stent and pneumobilia.   Electronically Signed   By: Arne Cleveland M.D.   On: 07/20/2014 11:43    Medications:  Scheduled: . apixaban  5 mg Oral BID  . carvedilol  12.5 mg Oral BID  WC  . docusate sodium  100 mg Oral BID  . famotidine  20 mg Oral Daily  . folic acid  1 mg Oral Daily  . insulin aspart  0-9 Units Subcutaneous TID WC  . insulin glargine  3 Units Subcutaneous QHS  . lactose free nutrition  237 mL Oral BID  . LORazepam  0.5 mg Oral QHS  . multivitamin with minerals  1 tablet Oral Daily  . piperacillin-tazobactam (ZOSYN)  IV  3.375 g Intravenous Q8H  . pravastatin  40 mg Oral QPM  . pregabalin  75 mg Oral q morning - 10a  . thiamine  100 mg Oral Daily  . vancomycin  750 mg Intravenous Q12H  . zolpidem  5 mg Oral QHS   Continuous: . sodium chloride 50 mL/hr at 07/20/14 7510   CHE:NIDPOEUMP, feeding supplement (GLUCERNA SHAKE), HYDROcodone-acetaminophen, lidocaine-prilocaine, LORazepam, nitroGLYCERIN, ondansetron **OR** ondansetron (ZOFRAN) IV, polyethylene glycol, prochlorperazine  Assessment/Plan:  Active Problems:   Type 2 diabetes mellitus   HYPERTENSION, BENIGN   Pancreatic cancer   Fever    Fever of unknown origin  No obvious source identified. Influenza PCR was negative. CT scan of the abdomen was done yesterday which revealed the pancreatic cancer and hepatic metastases, but no other  infectious process in the abdomen. Lungs were noted to have possible infiltrates. So it is quite possible he may have a pneumonia, although clinically this is not been impressive. Await blood cultures. Continue with vancomycin and Zosyn for now. Can his cancer be causing this fever? Can Xeloda? Lower extremity Doppler did show DVT in the left leg, same as previously.   History of metastatic Pancreatic Cancer His chemotherapy is on hold for now. Dr. Marin Olp is his oncologist. Abdominal pain could be due to his cancer. CT scan as above. Seen by his oncologist.  History of Bilateral DVT Doppler studies suggest a DVT in the left leg. This could be similar to what he had in October, rather than being a recurrence. Dose of Eliquis was reviewed with the pharmacist. He could be on a higher dose and there is no contraindication for the same. So we will increase the dose. No further change in treatment plan.   Essential Hypertension Monitor blood pressures closely. Continue home medications.  Type 2 Diabetes Mellitus Continue current medication regimen. And sliding scale coverage.  Macrocytic anemia Hemoglobin close to baseline. Continue to monitor.  DVT Prophylaxis: On full anticoagulation    Code Status: Full code  Family Communication: Discussed with the patient  Disposition Plan: Will likely return to skilled nursing facility when improved. Possible discharge Monday.    LOS: 2 days   [facility] Pager [phone] 07/21/2014, 8:13 AM  If 7PM-7AM, please contact night-coverage at [email or web address], password [facility]

## 2014-07-21 NOTE — Progress Notes (Signed)
Occupational Therapy Evaluation Patient Details Name: [name] [name] MRN: [id number] DOB: Dec 28, 1922 Today's Date: 07/21/2014    History of Present Illness 81 yo male with h/o stage 4 pancreatic cancer with liver mets, DVT, bil TKA, chronic systolic heart failure/cardiomyopathy admitted for fever from SNF.   Clinical Impression   Patient presents to OT with decreased ADL independence and safety. Will benefit from skilled OT to maximize independence and safety with ADLs. Recommend return to SNF where staff [name] him as needed.    Follow Up Recommendations  SNF;Supervision/Assistance - 24 hour    Equipment Recommendations  Other (comment) (tbd at SNF)    Recommendations for Other Services       Precautions / Restrictions Precautions Precautions: Fall Restrictions Weight Bearing Restrictions: No      Mobility Bed Mobility                  Transfers                      Balance                                            ADL Overall ADL's : Needs assistance/impaired Eating/Feeding: Set up;Sitting   Grooming: Wash/dry hands;Wash/dry face;Oral care;Set up;Sitting   Upper Body Bathing: Minimal assitance   Lower Body Bathing: Minimal assistance   Upper Body Dressing : Minimal assistance   Lower Body Dressing: Moderate assistance                 General ADL Comments: Pt received up in chair. Declined getting up due to condom cath, reports,. "It falls off every time I move." Nurse present at beginning of evaluation and states pt has been in chair for [name] few hours. Patient agreeable to chair level activities. Reports he lives at [facility] and gets assistance with ADLs as needed.     Vision                     Perception     Praxis      Pertinent Vitals/Pain Pain Assessment: No/denies pain     Hand Dominance Right   Extremity/Trunk Assessment Upper Extremity Assessment Upper Extremity Assessment: Overall WFL for  tasks assessed;Generalized weakness   Lower Extremity Assessment Lower Extremity Assessment: Defer to PT evaluation       Communication Communication Communication: HOH   Cognition Arousal/Alertness: Awake/alert Behavior During Therapy: WFL for tasks assessed/performed Overall Cognitive Status: No family/caregiver present to determine baseline cognitive functioning       Memory: Decreased short-term memory             General Comments       Exercises       Shoulder Instructions      Home Living Family/patient expects to be discharged to:: Skilled nursing facility                                 Additional Comments: pt reports being at SNF while getting treatment for cancer however was home prior to this      Prior Functioning/Environment Level of Independence: Independent with assistive device(s)        Comments: reports staff assist him with shower twice [name] week, uses [name] shower chair for that. Uses RW for mobility.  OT Diagnosis: Generalized weakness   OT Problem List: Decreased strength;Decreased activity tolerance;Decreased safety awareness;Decreased cognition   OT Treatment/Interventions: Self-care/ADL training;Energy conservation;DME and/or AE instruction;Therapeutic activities;Patient/family education    OT Goals(Current goals can be found in the care plan section) Acute Rehab OT Goals Patient Stated Goal: to go back to [facility] OT Goal Formulation: With patient Time For Goal Achievement: 08/04/14 Potential to Achieve Goals: Good  OT Frequency: Min 1X/week   Barriers to D/C:            Co-evaluation              End of Session Nurse Communication: Mobility status  Activity Tolerance: Patient tolerated treatment well Patient left: in chair;with call bell/phone within reach   Time: 1255-1314 OT Time Calculation (min): 19 min Charges:  OT General Charges $OT Visit: 1 Procedure OT Evaluation $Initial OT Evaluation  Tier I: 1 Procedure OT Treatments $Self Care/Home Management : 8-22 mins G-Codes:    [name] [name] August 17, 2014, 1:26 PM

## 2014-07-22 LAB — HEMOGLOBIN A1C
Hgb A1c MFr Bld: 6.8 % — ABNORMAL HIGH (ref <5.7)
Mean Plasma Glucose: 148 mg/dL — ABNORMAL HIGH (ref <117)

## 2014-07-22 LAB — URINE CULTURE
CULTURE: NO GROWTH
Colony Count: NO GROWTH

## 2014-07-22 LAB — BASIC METABOLIC PANEL
Anion gap: 6 (ref 5–15)
BUN: 22 mg/dL (ref 6–23)
CALCIUM: 8.9 mg/dL (ref 8.4–10.5)
CHLORIDE: 100 meq/L (ref 96–112)
CO2: 27 mmol/L (ref 19–32)
CREATININE: 1 mg/dL (ref 0.50–1.35)
GFR calc Af Amer: 75 mL/min — ABNORMAL LOW (ref >90)
GFR calc non Af Amer: 64 mL/min — ABNORMAL LOW (ref >90)
Glucose, Bld: 113 mg/dL — ABNORMAL HIGH (ref 70–99)
POTASSIUM: 3.8 mmol/L (ref 3.5–5.1)
SODIUM: 133 mmol/L — AB (ref 135–145)

## 2014-07-22 LAB — CBC
HEMATOCRIT: 31.3 % — AB (ref 39.0–52.0)
Hemoglobin: 10.5 g/dL — ABNORMAL LOW (ref 13.0–17.0)
MCH: 34 pg (ref 26.0–34.0)
MCHC: 33.5 g/dL (ref 30.0–36.0)
MCV: 101.3 fL — ABNORMAL HIGH (ref 78.0–100.0)
PLATELETS: 190 10*3/uL (ref 150–400)
RBC: 3.09 MIL/uL — ABNORMAL LOW (ref 4.22–5.81)
RDW: 17.1 % — AB (ref 11.5–15.5)
WBC: 4.6 10*3/uL (ref 4.0–10.5)

## 2014-07-22 LAB — VANCOMYCIN, TROUGH: VANCOMYCIN TR: 15.1 ug/mL (ref 10.0–20.0)

## 2014-07-22 LAB — CULTURE, BLOOD (ROUTINE X 2)

## 2014-07-22 LAB — GLUCOSE, CAPILLARY
GLUCOSE-CAPILLARY: 115 mg/dL — AB (ref 70–99)
Glucose-Capillary: 184 mg/dL — ABNORMAL HIGH (ref 70–99)
Glucose-Capillary: 130 mg/dL — ABNORMAL HIGH (ref 70–99)
Glucose-Capillary: 182 mg/dL — ABNORMAL HIGH (ref 70–99)

## 2014-07-22 MED ORDER — CEFTRIAXONE SODIUM IN DEXTROSE 20 MG/ML IV SOLN
1.0000 g | INTRAVENOUS | Status: DC
  Administered 2014-07-22 – 2014-07-23 (×2): 1 g via INTRAVENOUS
  Filled 2014-07-22 (×2): qty 50

## 2014-07-22 NOTE — Progress Notes (Addendum)
[facility] PROGRESS NOTE  [name] [name] LYY:[id number] DOB: Jan 15, 1923 DOA: 07/19/2014  PCP: Drema Pry, DO  Brief HPI: 81 year old Caucasian male who was recently hospitalized for similar issues with fever and was discharged on December 18, on Levaquin. He lives in a skilled nursing facility. He presented with a fever of 103F. He has a history of metastatic pancreatic cancer. He was admitted for further management.  Past medical history:  Past Medical History  Diagnosis Date  . Diverticulosis of colon (without mention of hemorrhage)   . Irritable bowel syndrome   . Personal history of malignant neoplasm of prostate     s/p  radioactive seed implants 2000  . Unspecified hereditary and idiopathic peripheral neuropathy   . Nonischemic cardiomyopathy cardiologist-  dr Aundra Dubin    EF 25%  per last echo 2012 and cardiologist note--  2007-  ef  40-45%;  2008- ef 35-40%;  2010- ef 30-35%;  2012- ef 25%  . Sinus bradycardia   . Hypertension   . Arthritis     hands  . Hyperlipidemia   . History of DVT of lower extremity     2002  ,   left leg  . History of adenomatous polyp of colon     2001  . History of basal cell carcinoma excision   . Anticoagulated on Coumadin   . Type 2 diabetes, diet controlled   . GERD (gastroesophageal reflux disease)   . LBBB (left bundle branch block)   . Wears glasses   . Wears hearing aid     BILATERAL  . Unsteady gait   . DDD (degenerative disc disease)     cervical and lumbar  . DJD (degenerative joint disease)     hips  . Lower urinary tract symptoms (LUTS)   . 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 Coronary artery disease     per cath 2002  non-obstructive cad  . Chronic systolic heart failure   . H/O fracture of nose     12-30-2013-  pt fell at home open nose bone fx    Consultants: None  Procedures: None  Antibiotics: Vancomycin and  Zosyn 12/31.  Subjective: Patient feels well. Cough is improved. Denies any chest pain or shortness of breath. Hasn't noticed any bleeding.  Objective: Vital Signs  Filed Vitals:   07/21/14 0447 07/21/14 1502 07/21/14 2123 07/22/14 0452  BP: 110/56 106/57 125/61 111/51  Pulse: 51 54 55 54  Temp: 98.1 F (36.7 C) 97.8 F (36.6 C) 98.7 F (37.1 C) 98.7 F (37.1 C)  TempSrc: Oral Oral Oral Oral  Resp: 16 16 16 16   Weight:      SpO2: 100% 98% 96% 94%    Intake/Output Summary (Last 24 hours) at 07/22/14 0824 Last data filed at 07/22/14 0511  Gross per 24 hour  Intake    630 ml  Output   2300 ml  Net  -1670 ml   Filed Weights   07/20/14 0206  Weight: 73.483 kg (162 lb)    General appearance: alert, cooperative, appears stated age and no distress Resp: clear to auscultation bilaterally Cardio: regular rate and rhythm, S1, S2 normal, no murmur, click, rub or gallop GI: Abdomen is soft. Some fullness is appreciated in the upper abdomen. Mild tenderness without any rebound, rigidity or guarding. No masses or obvious organomegaly. Bowel sounds are present. Extremities: Minimal edema in the lower  extremities. No abnormal joint swelling. Neurologic: Alert and oriented 3. Cranial nerves II-12 intact. Motor strength equal bilateral upper and lower extremities.  Lab Results:  Basic Metabolic Panel:  Recent Labs Lab 07/19/14 2023 07/20/14 0500 07/21/14 0530 07/22/14 0500  NA 132* 137 137 133*  K 3.8 4.0 4.6 3.8  CL 98 103 103 100  CO2 26 27 28 27   GLUCOSE 217* 122* 132* 113*  BUN 24* 21 21 22   CREATININE 1.09 0.99 1.06 1.00  CALCIUM 8.8 8.8 7.5* 8.9   Liver Function Tests:  Recent Labs Lab 07/19/14 2023 07/20/14 0500 07/21/14 0530  AST 76* 57* 32  ALT 48 40 30  ALKPHOS 278* 240* 194*  BILITOT 0.6 0.9 0.9  PROT 6.7 6.1 6.2  ALBUMIN 3.4* 3.1* 3.0*   CBC:  Recent Labs Lab 07/19/14 2023 07/20/14 0500 07/21/14 0530 07/22/14 0500  WBC 5.7 5.0 4.0 4.6    NEUTROABS 4.5  --   --   --   HGB 10.0* 9.6* 10.3* 10.5*  HCT 29.9* 29.3* 30.6* 31.3*  MCV 100.7* 101.7* 101.7* 101.3*  PLT 168 161 179 190   CBG:  Recent Labs Lab 07/21/14 0813 07/21/14 1146 07/21/14 1659 07/21/14 2121 07/22/14 0742  GLUCAP 185* 138* 159* 160* 115*    Studies/Results: Ct Abdomen Pelvis W Contrast  07/20/2014   CLINICAL DATA:  abd pain, hx of HTN, diabetes, bladder ca-2014 with chemo instillation, fever of 103 with unknown origion with hx of pancreatic ca  EXAM: CT ABDOMEN AND PELVIS WITH CONTRAST  TECHNIQUE: Multidetector CT imaging of the abdomen and pelvis was performed using the standard protocol following bolus administration of intravenous contrast.  CONTRAST:  11mL OMNIPAQUE IOHEXOL 300 MG/ML  SOLN  COMPARISON:  For 06/04/2014  FINDINGS: Tiny bilateral pleural effusions. Coarse subsegmental atelectasis versus early airspace opacities in the posterior basal segments of both lower lobes. Coronary calcifications. Patent metallic biliary stent as evidenced by pneumobilia. There is a poorly marginated low-attenuation region in hepatic segment 7 measuring approximately 2.6 x 5 cm, previously 2.8 x 4.5 cm. There is a 2 cm segment 8 lesion without convincing interval change. There is a 14x 20 mm lesion in hepatic segment 4 image 23 which was less conspicuous on the prior study. Unremarkable spleen, adrenal glands, kidneys. There is fatty infiltration of the pancreas with a poorly marginated mass in the pancreatic body partially encasing the proximal SMA and SMV.  Stomach is physiologically distended. Small bowel and colon are nondilated. Innumerable sigmoid diverticula without significant adjacent inflammatory/ edematous change. Urinary bladder incompletely distended. Multiple metallic seeds around the prostate. No ascites. No free air. Aortoiliac atheromatous calcifications. Spondylitic changes throughout the lumbar spine.  IMPRESSION: 1. New tiny pleural effusions with  adjacent atelectasis/ infiltrate. 2. Stable poorly marginated hepatic lesions suggesting metastatic disease. 3. Little change in pancreatic mass with patent metallic biliary stent and pneumobilia.   Electronically Signed   By: Arne Cleveland M.D.   On: 07/20/2014 11:43    Medications:  Scheduled: . apixaban  5 mg Oral BID  . carvedilol  12.5 mg Oral BID WC  . docusate sodium  100 mg Oral BID  . famotidine  20 mg Oral Daily  . folic acid  1 mg Oral Daily  . insulin aspart  0-9 Units Subcutaneous TID WC  . insulin glargine  3 Units Subcutaneous QHS  . lactose free nutrition  237 mL Oral BID  . LORazepam  0.5 mg Oral QHS  . multivitamin with minerals  1 tablet Oral Daily  . piperacillin-tazobactam (ZOSYN)  IV  3.375 g Intravenous Q8H  . pravastatin  40 mg Oral QPM  . pregabalin  75 mg Oral q morning - 10a  . thiamine  100 mg Oral Daily  . vancomycin  750 mg Intravenous Q12H  . zolpidem  5 mg Oral QHS   Continuous: . sodium chloride 10 mL/hr at 07/21/14 4431   VQM:[id number], feeding supplement (GLUCERNA SHAKE), HYDROcodone-acetaminophen, lidocaine-prilocaine, LORazepam, nitroGLYCERIN, ondansetron **OR** ondansetron (ZOFRAN) IV, polyethylene glycol, prochlorperazine  Assessment/Plan:  Active Problems:   Type 2 diabetes mellitus   HYPERTENSION, BENIGN   Pancreatic cancer   Fever    Fever of unknown origin/Bacteremia  RN called with positive blood culture preliminary report of gram pos cocci in chains. No such report in epic yet. No other obvious source identified. Influenza PCR was negative. CT scan of the abdomen was done 1/1which revealed the pancreatic cancer and hepatic metastases, but no other infectious process in the abdomen. Lungs were noted to have possible infiltrates. So it is quite possible he may have a pneumonia, although clinically this is not been impressive. Await blood cultures. Continue with vancomycin and Zosyn for now. Can his cancer be causing this fever? Can  Xeloda? Lower extremity Doppler did show DVT in the left leg, same as previously.   History of metastatic Pancreatic Cancer His chemotherapy is on hold for now. Dr. Marin Olp is his oncologist. Abdominal pain could be due to his cancer. CT scan as above. Seen by his oncologist.  History of Bilateral DVT Doppler studies suggest a DVT in the left leg. This could be similar to what he had in October, rather than being a recurrence. Dose of Eliquis was reviewed with the pharmacist. He could be on a higher dose and there is no contraindication for the same. So dose was increased on 1/2. No further change in treatment plan.   Essential Hypertension Monitor blood pressures closely. Continue home medications.  Type 2 Diabetes Mellitus Continue current medication regimen. And sliding scale coverage.  Macrocytic anemia Hemoglobin close to baseline. Continue to monitor.  ADDENDUM One of two blood cultures reported as Strep viridans. This could be significant considering his malignancy and previous FUO. Discussed with Dr. Johnnye Sima. Will treat with Ceftriaxone for 7 more days. And then with Amoxicillin for 2 weeks. Will stop Vanc and Zosyn.  DVT Prophylaxis: On full anticoagulation    Code Status: Full code  Family Communication: Discussed with the patient  Disposition Plan: Will likely return to skilled nursing facility when improved. Possible discharge Monday. Await final blood cultures.    LOS: 3 days   [facility] Pager [phone] 07/22/2014, 8:24 AM  If 7PM-7AM, please contact night-coverage at [email or web address], password [facility]

## 2014-07-22 NOTE — Progress Notes (Signed)
ANTIBIOTIC CONSULT NOTE   Pharmacy Consult for Zosyn and Vancomycin Indication: Sepsis  No Known Allergies  Patient Measurements: Weight: 162 lb (73.483 kg) Wt=74 kg  Vital Signs: Temp: 98.7 F (37.1 C) (01/03 0452) Temp Source: Oral (01/03 0452) BP: 111/51 mmHg (01/03 0452) Pulse Rate: 54 (01/03 0452) Intake/Output from previous day: 01/02 0701 - 01/03 0700 In: 870 [P.O.:870] Out: 2850 [Urine:2850] Intake/Output from this shift:    Labs:  Recent Labs  07/20/14 0500 07/21/14 0530 07/22/14 0500  WBC 5.0 4.0 4.6  HGB 9.6* 10.3* 10.5*  PLT 161 179 190  CREATININE 0.99 1.06 1.00   Estimated Creatinine Clearance: 51.7 mL/min (by C-G formula based on Cr of 1).  Recent Labs  07/22/14 0520  [place] 15.1     Microbiology: Recent Results (from the past 720 hour(s))  Culture, blood (routine x 2)     Status: None   Collection Time: 07/02/14  4:43 PM  Result Value Ref Range Status   Specimen Description BLOOD BLOOD LEFT FOREARM  Final   Special Requests BOTTLES DRAWN AEROBIC AND ANAEROBIC 5ML  Final   Culture  Setup Time   Final    07/02/2014 20:43 Performed at [place]   Final    NO GROWTH 5 DAYS Performed at [facility]    Report Status 07/08/2014 FINAL  Final  Culture, blood (routine x 2)     Status: None   Collection Time: 07/02/14  5:07 PM  Result Value Ref Range Status   Specimen Description BLOOD BLOOD RIGHT FOREARM  Final   Special Requests BOTTLES DRAWN AEROBIC AND ANAEROBIC 3ML  Final   Culture  Setup Time   Final    07/02/2014 20:43 Performed at [place]   Final    NO GROWTH 5 DAYS Performed at [facility]    Report Status 07/08/2014 FINAL  Final  Urine culture     Status: None   Collection Time: 07/02/14  5:10 PM  Result Value Ref Range Status   Specimen Description URINE, CLEAN CATCH  Final   Special Requests NONE  Final   Culture  Setup Time   Final    07/02/2014  20:54 Performed at [place] Performed at [facility]   Final   Culture NO GROWTH Performed at [facility]   Final   Report Status 07/03/2014 FINAL  Final  MRSA PCR Screening     Status: None   Collection Time: 07/02/14 10:04 PM  Result Value Ref Range Status   MRSA by PCR NEGATIVE NEGATIVE Final    Comment:        The GeneXpert MRSA Assay (FDA approved for NASAL specimens only), is one component of a comprehensive MRSA colonization surveillance program. It is not intended to diagnose MRSA infection nor to guide or monitor treatment for MRSA infections.     Assessment: 12 yoM w/ hx of stage IV pancreatic cancer presents with fever of 103. He was recently discharged, 12/18, where he was admitted for fever of unknown origin. He was started on broad spectrum antibiotics.    1/1 >> Zosyn >> 1/1 >> Vanc >>   Tmax: AF now WBCs: WNL Renal: SCr wnl, normalized CrCl = 42ml/min  1/1 blood x 2: pending 1/1 urine: pending 1/1 influenza PCR: negative  Drug level / dose changes info: 1/3 0530 VT = 15.1 mcg/ml on 750mg  q12h (prior  to 5th dose)  Goal of Therapy:  Vancomycin trough level 15-20 mcg/ml  Plan:  Day #2 vancomycin/zosyn  Continue Zosyn 3.375 Gm IV q8h EI  Continue Vancomycin 750mg  IV q12h as vancomycin trough within goal range  F/U Scr/levels/cultures as needed  Cultures still in process!!  Xeloda can cause fevers - reported incidence up to 18% per Lexi-Comp  Doreene Eland, PharmD, BCPS.   Pager: [phone]  07/22/2014,7:09 AM

## 2014-07-23 ENCOUNTER — Other Ambulatory Visit: Payer: Medicare Other | Admitting: Lab

## 2014-07-23 ENCOUNTER — Telehealth: Payer: Self-pay | Admitting: Hematology & Oncology

## 2014-07-23 ENCOUNTER — Ambulatory Visit: Payer: Medicare Other | Admitting: Family

## 2014-07-23 DIAGNOSIS — B955 Unspecified streptococcus as the cause of diseases classified elsewhere: Secondary | ICD-10-CM

## 2014-07-23 DIAGNOSIS — R7881 Bacteremia: Secondary | ICD-10-CM

## 2014-07-23 HISTORY — DX: Bacteremia: R78.81

## 2014-07-23 HISTORY — DX: Unspecified streptococcus as the cause of diseases classified elsewhere: B95.5

## 2014-07-23 LAB — GLUCOSE, CAPILLARY
GLUCOSE-CAPILLARY: 133 mg/dL — AB (ref 70–99)
GLUCOSE-CAPILLARY: 177 mg/dL — AB (ref 70–99)

## 2014-07-23 MED ORDER — CEFTRIAXONE SODIUM IN DEXTROSE 20 MG/ML IV SOLN: 1.0000 g | INTRAVENOUS | Status: DC

## 2014-07-23 MED ORDER — LORAZEPAM 0.5 MG PO TABS: 0.5000 mg | ORAL_TABLET | Freq: Every day | ORAL | Status: DC

## 2014-07-23 MED ORDER — HYDROCODONE-ACETAMINOPHEN 10-325 MG PO TABS: ORAL_TABLET | ORAL | Status: DC

## 2014-07-23 MED ORDER — HYDROCODONE-ACETAMINOPHEN 5-325 MG PO TABS
1.0000 | ORAL_TABLET | ORAL | Status: DC | PRN
  Administered 2014-07-23: 1 via ORAL
  Filled 2014-07-23: qty 1

## 2014-07-23 MED ORDER — ADULT MULTIVITAMIN W/MINERALS CH: 1.0000 | ORAL_TABLET | Freq: Every day | ORAL | Status: AC

## 2014-07-23 MED ORDER — OXYCODONE HCL 5 MG PO TABS: 5.0000 mg | ORAL_TABLET | Freq: Two times a day (BID) | ORAL | Status: DC

## 2014-07-23 MED ORDER — AMOXICILLIN 500 MG PO CAPS: 500.0000 mg | ORAL_CAPSULE | Freq: Three times a day (TID) | ORAL | Status: DC

## 2014-07-23 MED ORDER — HEPARIN SOD (PORK) LOCK FLUSH 100 UNIT/ML IV SOLN
500.0000 [IU] | INTRAVENOUS | Status: AC | PRN
  Administered 2014-07-23: 500 [IU]
  Filled 2014-07-23: qty 5

## 2014-07-23 MED ORDER — SODIUM CHLORIDE 0.9 % IJ SOLN
10.0000 mL | INTRAMUSCULAR | Status: AC | PRN
  Administered 2014-07-23: 10 mL

## 2014-07-23 MED ORDER — APIXABAN 5 MG PO TABS: 5.0000 mg | ORAL_TABLET | Freq: Two times a day (BID) | ORAL | Status: DC

## 2014-07-23 NOTE — Consult Note (Signed)
[place]  [phone]   Requesting Provider: [facility]  Consulting Provider: Allen Kell  Primary Oncologist: Riley Churches CONSULTATION  NOTE  Reason for Consultation: Pancreatic Cancer  HPI: This is an 81 year old white male with a history of pancreatic cancer metastatic to the liver, currently on Xeloda therapy, recently discharged on 12/18, re- admitted 07/20/14   with acute mental status changes and fever up to 103 F from his SNF. Cultures are positive for Strep viridans. Flu test was negative.  CT of the abdomen and pelvis on 1/1 was negative for acute findings other than known pancreatic and hepatic malignancy. Lungs demonstrated infiltrates suspicious for pneumonia. He was on vanco and Zosyn then changed to Rocephin. Lower extremity doppler shows known LLE DVT.   CURRENT TREATMENT: Xeloda 1500 mg BID (14 days on 7 off) , started on 06/06/14. He is s/p Cycle 2 Eliquis 2.5 mg BID   Past Medical History  Diagnosis Date  . Diverticulosis of colon (without mention of hemorrhage)   . Irritable bowel syndrome   . Personal history of malignant neoplasm of prostate     s/p  radioactive seed implants 2000  . Unspecified hereditary and idiopathic peripheral neuropathy   . Nonischemic cardiomyopathy cardiologist-  dr Aundra Dubin    EF 25%  per last echo 2012 and cardiologist note--  2007-  ef  40-45%;  2008- ef 35-40%;  2010- ef 30-35%;  2012- ef 25%  . Sinus bradycardia   . Hypertension   . Arthritis     hands  . Hyperlipidemia   . History of DVT of lower extremity     2002  ,   left leg  . History of adenomatous polyp of colon     2001  . History of basal cell carcinoma excision   . Anticoagulated on Coumadin   . Type 2 diabetes, diet controlled   . GERD (gastroesophageal reflux disease)   . LBBB (left bundle branch block)   . Wears glasses   . Wears hearing aid     BILATERAL  . Unsteady gait   . DDD (degenerative  disc disease)     cervical and lumbar  . DJD (degenerative joint disease)     hips  . Lower urinary tract symptoms (LUTS)   . 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 Coronary artery disease     per cath 2002  non-obstructive cad  . Chronic systolic heart failure   . H/O fracture of nose     12-30-2013-  pt fell at home open nose bone fx      Past Surgical History  Procedure Laterality Date  . Knee arthroscopy Left 1991  . Ercp with sphincterotomy and stone removal  05/ 1993  &  05-11-2006  . Total knee arthroplasty  10/05/2011    Procedure: TOTAL KNEE ARTHROPLASTY;  Surgeon: Gearlean Alf, MD;  Location: [facility];  Service: Orthopedics;  Laterality: Right;  . Transurethral resection of bladder tumor N/A 09/30/2012    Procedure: TRANSURETHRAL RESECTION OF BLADDER TUMOR (TURBT);  Surgeon: Franchot Gallo, MD;  Location: [facility];  Service: Urology;  Laterality: N/A;  WITH MITOMYCIN INSTILLATION   . Eus N/A 01/04/2014    Procedure: ESOPHAGEAL ENDOSCOPIC ULTRASOUND (EUS) RADIAL;  Surgeon: [name], MD;  Location: [facility];  Service: Endoscopy;  Laterality: N/A;  . Radioactive prostate seed implants  01/ 2000  . Mohs surgery  03/ 2009    LEFT EAR BASAL CELL CARCINOMA  . Tonsillectomy  AS CHILD  . Cholecystectomy open  1985  . Appendectomy  child  . Inguinal hernia repair Bilateral 1998    recurrent RIH  w/ repair 06-21-2006  . Total knee arthroplasty Left 02-21-2007    AND 04-21-2007  CLOSED MANIPULATION  . Cataract extraction w/ intraocular lens implant Right 2004  . Lumbar spine surgery  X2  . Cardiac catheterization  06-14-2001  dr Darnell Level brodie    non-obstructive CAD/  LM 30%,  pLAD 50%,  mLAD 30%,  RCA  &  CFX  irregularites ,  global hypokinesis,  ef 40%  . Cardiovascular stress test  02-07-2007   dr Darnell Level brodie    negative for ischemia,  low risk myoview perfusion study  .  Transthoracic echocardiogram  01-28-2011    moderate LVH/  ef 25%/  diffuse hypokinesis/  mild LAE/  mild TR/  trivial AR & MR  . Cystoscopy w/ retrogrades Bilateral 03/09/2014    Procedure: CYSTOSCOPY WITH BILATERAL RETROGRADE PYELOGRAM BLADDER BX AND RENAL WASHINGS.;  Surgeon: Jorja Loa, MD;  Location: [facility]  Service: Urology;  Laterality: Bilateral;  . Eus N/A 04/05/2014    Procedure: UPPER ENDOSCOPIC ULTRASOUND (EUS) LINEAR;  Surgeon: [name], MD;  Location: [facility];  Service: Endoscopy;  Laterality: N/A;  radial linear  . Endoscopic retrograde cholangiopancreatography (ercp) with propofol N/A 04/12/2014    Procedure: ENDOSCOPIC RETROGRADE CHOLANGIOPANCREATOGRAPHY (ERCP) WITH PROPOFOL;  Surgeon: [name], MD;  Location: [facility];  Service: Endoscopy;  Laterality: N/A;  metal stent  . Biliary stent placement N/A 04/12/2014    Procedure: BILIARY STENT PLACEMENT;  Surgeon: [name], MD;  Location: [facility];  Service: Endoscopy;  Laterality: N/A;    MEDICATIONS:  Scheduled Meds: . apixaban  5 mg Oral BID  . carvedilol  12.5 mg Oral BID WC  . cefTRIAXone (ROCEPHIN)  IV  1 g Intravenous Q24H  . docusate sodium  100 mg Oral BID  . famotidine  20 mg Oral Daily  . folic acid  1 mg Oral Daily  . insulin aspart  0-9 Units Subcutaneous TID WC  . insulin glargine  3 Units Subcutaneous QHS  . lactose free nutrition  237 mL Oral BID  . LORazepam  0.5 mg Oral QHS  . multivitamin with minerals  1 tablet Oral Daily  . pravastatin  40 mg Oral QPM  . pregabalin  75 mg Oral q morning - 10a  . thiamine  100 mg Oral Daily  . zolpidem  5 mg Oral QHS   Continuous Infusions: . sodium chloride 10 mL/hr at 07/21/14 0823   PRN Meds:.bisacodyl, feeding supplement (GLUCERNA SHAKE), HYDROcodone-acetaminophen, lidocaine-prilocaine, LORazepam, nitroGLYCERIN, ondansetron **OR** ondansetron (ZOFRAN) IV, polyethylene glycol,  prochlorperazine  ALLERGIES: No Known Allergies  Family History  Problem Relation Age of Onset  . Throat cancer Brother   . Coronary artery disease Father     deceased       History   Social History  . Marital Status: Widowed    Spouse Name: N/A    Number of Children: 0  . Years of Education: N/A   Occupational History  .     Social History Main Topics  . Smoking status: Former Smoker -- 1.00 packs/day for 10 years    Types: Cigarettes    Start date: 04/10/1954    Quit date: 09/10/1982  .  Smokeless tobacco: Never Used     Comment: quit 32 years ago  . Alcohol Use: No  . Drug Use: No  . Sexual Activity: Not on file   Other Topics Concern  . Not on file   Social History Narrative   Married, no children.      PHYSICAL EXAMINATION:  Filed Vitals:   07/23/14 0603  BP: 119/57  Pulse: 58  Temp: 97.6 F (36.4 C)  Resp: 18      Intake/Output Summary (Last 24 hours) at 07/23/14 0851 Last data filed at 07/23/14 0603  Gross per 24 hour  Intake    480 ml  Output   2950 ml  Net  -2470 ml    ECOG PERFORMANCE STATUS: 3-4  GENERAL: alert, no distress and comfortable. He is somewhat confused. SKIN: skin color, texture, turgor are normal, no rashes or significant lesions EYES: normal, conjunctiva are pink and non-injected, sclera clear OROPHARYNX:no exudate, no erythema and lips, buccal mucosa, and tongue normal  NECK: supple, thyroid normal size, non-tender, without nodularity LYMPH:  no palpable lymphadenopathy in the cervical, axillary or inguinal LUNGS: clear to auscultation and percussion with normal breathing effort HEART: regular rate & rhythm and no murmurs and no lower extremity edema ABDOMEN:abdomen soft, non-tender and normal bowel sounds Musculoskeletal:no cyanosis of digits and no clubbing  PSYCH: alert & oriented to self and place. Unable to tell year or date NEURO: no focal motor/sensory deficits  LABORATORY/RADIOLOGY DATA:   Recent Labs Lab  07/19/14 2023 07/20/14 0500 07/21/14 0530 07/22/14 0500  WBC 5.7 5.0 4.0 4.6  HGB 10.0* 9.6* 10.3* 10.5*  HCT 29.9* 29.3* 30.6* 31.3*  PLT 168 161 179 190  MCV 100.7* 101.7* 101.7* 101.3*  MCH 33.7 33.3 34.2* 34.0  MCHC 33.4 32.8 33.7 33.5  RDW 17.1* 17.3* 17.1* 17.1*  LYMPHSABS 0.6*  --   --   --   MONOABS 0.5  --   --   --   EOSABS 0.0  --   --   --   BASOSABS 0.0  --   --   --     CMP    Recent Labs Lab 07/19/14 2023 07/20/14 0500 07/21/14 0530 07/22/14 0500  NA 132* 137 137 133*  K 3.8 4.0 4.6 3.8  CL 98 103 103 100  CO2 26 27 28 27   GLUCOSE 217* 122* 132* 113*  BUN 24* 21 21 22   CREATININE 1.09 0.99 1.06 1.00  CALCIUM 8.8 8.8 7.5* 8.9  AST 76* 57* 32  --   ALT 48 40 30  --   ALKPHOS 278* 240* 194*  --   BILITOT 0.6 0.9 0.9  --         Component Value Date/Time   BILITOT 0.9 07/21/2014 0530   BILITOT 0.70 07/11/2014 1152   BILIDIR 0.1 01/31/2014 1608        Component Value Date/Time   ESRSEDRATE 37* 04/28/2013 0946    No results for input(s): INR, PROTIME in the last 168 hours.    Urinalysis    Component Value Date/Time   COLORURINE YELLOW 07/19/2014 2302   APPEARANCEUR CLEAR 07/19/2014 2302   LABSPEC 1.008 07/19/2014 2302   PHURINE 5.5 07/19/2014 2302   GLUCOSEU NEGATIVE 07/19/2014 2302   GLUCOSEU NEGATIVE 08/23/2008 0849   HGBUR SMALL* 07/19/2014 2302   BILIRUBINUR NEGATIVE 07/19/2014 2302   KETONESUR NEGATIVE 07/19/2014 2302   PROTEINUR NEGATIVE 07/19/2014 2302   UROBILINOGEN 0.2 07/19/2014 2302   NITRITE NEGATIVE 07/19/2014 2302  LEUKOCYTESUR NEGATIVE 07/19/2014 2302    Drugs of Abuse  No results found for: LABOPIA, COCAINSCRNUR, LABBENZ, AMPHETMU, THCU, LABBARB   Liver Function Tests:  Recent Labs Lab 07/19/14 2023 07/20/14 0500 07/21/14 0530  AST 76* 57* 32  ALT 48 40 30  ALKPHOS 278* 240* 194*  BILITOT 0.6 0.9 0.9  PROT 6.7 6.1 6.2  ALBUMIN 3.4* 3.1* 3.0*   No results for input(s): LIPASE, AMYLASE in the last  168 hours. Cardiac Enzymes: No results for input(s): CKTOTAL, CKMB, CKMBINDEX, TROPONINI in the last 168 hours. Radiology Studies:  Dg Chest 2 View  07/19/2014   CLINICAL DATA:  Fever  EXAM: CHEST  2 VIEW  COMPARISON:  07/04/2014  FINDINGS: There is mild cardiomegaly which is stable. Aortic and hilar contours are unchanged. Right IJ porta catheter, tip at the SVC level.  Streaky bibasilar lung opacities which favor atelectasis. No edema, effusion, or pneumothorax.  IMPRESSION: 1. Subsegmental bibasilar atelectasis. 2. Cardiomegaly without failure.   Electronically Signed   By: Jorje Guild M.D.   On: 07/19/2014 20:55   Dg Chest 2 View  07/02/2014   CLINICAL DATA:  Fever and altered level of consciousness.  EXAM: CHEST  2 VIEW  COMPARISON:  PA and lateral chest 03/30/2014 03/01/2014 and 09/22/2012.  FINDINGS: Port-A-Cath is in place. Mild streaky opacity in the left lung base is most consistent with atelectasis or scar. No consolidative process, pneumothorax or effusion is identified. Heart size is upper normal.  IMPRESSION: No acute abnormality.   Electronically Signed   By: Inge Rise M.D.   On: 07/02/2014 18:20   Ct Abdomen Pelvis W Contrast  07/20/2014   CLINICAL DATA:  abd pain, hx of HTN, diabetes, bladder ca-2014 with chemo instillation, fever of 103 with unknown origion with hx of pancreatic ca  EXAM: CT ABDOMEN AND PELVIS WITH CONTRAST  TECHNIQUE: Multidetector CT imaging of the abdomen and pelvis was performed using the standard protocol following bolus administration of intravenous contrast.  CONTRAST:  177mL OMNIPAQUE IOHEXOL 300 MG/ML  SOLN  COMPARISON:  For 06/04/2014  FINDINGS: Tiny bilateral pleural effusions. Coarse subsegmental atelectasis versus early airspace opacities in the posterior basal segments of both lower lobes. Coronary calcifications. Patent metallic biliary stent as evidenced by pneumobilia. There is a poorly marginated low-attenuation region in hepatic segment 7  measuring approximately 2.6 x 5 cm, previously 2.8 x 4.5 cm. There is a 2 cm segment 8 lesion without convincing interval change. There is a 14x 20 mm lesion in hepatic segment 4 image 23 which was less conspicuous on the prior study. Unremarkable spleen, adrenal glands, kidneys. There is fatty infiltration of the pancreas with a poorly marginated mass in the pancreatic body partially encasing the proximal SMA and SMV.  Stomach is physiologically distended. Small bowel and colon are nondilated. Innumerable sigmoid diverticula without significant adjacent inflammatory/ edematous change. Urinary bladder incompletely distended. Multiple metallic seeds around the prostate. No ascites. No free air. Aortoiliac atheromatous calcifications. Spondylitic changes throughout the lumbar spine.  IMPRESSION: 1. New tiny pleural effusions with adjacent atelectasis/ infiltrate. 2. Stable poorly marginated hepatic lesions suggesting metastatic disease. 3. Little change in pancreatic mass with patent metallic biliary stent and pneumobilia.   Electronically Signed   By: Arne Cleveland M.D.   On: 07/20/2014 11:43   Dg Chest Port 1 View  07/04/2014   CLINICAL DATA:  Productive cough, prostate cancer, hypertension, type 2 diabetes, bladder cancer, coronary artery disease, chronic systolic heart failure  EXAM: PORTABLE CHEST -  1 VIEW  COMPARISON:  Portable exam 1445 hr compared to 07/02/2014  FINDINGS: RIGHT jugular Port-A-Cath with tip projecting over distal SVC.  Enlargement of cardiac silhouette.  Mildly tortuous aorta with atherosclerotic calcification.  Pulmonary vascular congestion.  Bibasilar atelectasis.  No infiltrate, pleural effusion or pneumothorax.  IMPRESSION: Enlargement of cardiac silhouette with slight pulmonary vascular congestion.  Bibasilar atelectasis.   Electronically Signed   By: Lavonia Dana M.D.   On: 07/04/2014 16:11       ASSESSMENT AND PLAN:  Fever of unknown origin Bacteremia Cultures are positive  for Strep Viridans, likely the source of fever. Fever is unlikely due to Xeloda. Continue on IV Rocephin as per admitting team, along with IV fluids To switch to Amoxicillin for 2 weeks thereafter  History of Pancreatic Cancer with liver metastasis The patient is on Xeloda, s/p cycle 2, currently on hold due to hospitalization  History of DVT LLE doppler shows known LLE DVT for which he was on Eliquis. He is on Eliquis, continue present therapy, but Pharmacy recommended increasing the dose on 07/21/14 to 5 mg Continue to monitor.  Malnutrition Appreciate nutrition evaluation  Anemia, macrocytic In the setting of malignancy, infection, dilution, chemo No bleeding issues noted.  No transfusion indicated at this time  DM type 2 As per primary team  DNI  Other medical issues as per admitting team   **Disclaimer: This note was dictated with voice recognition software. Similar sounding words can inadvertently be transcribed and this note may contain transcription errors which may not have been corrected upon publication of note.**  WERTMAN,SARA E, PA-C 07/23/2014, 8:51 AM   ADDENDUM:  I saw and examined the patient. I agree with the above. Looks like he has strep is blood. I I do not know if this might be coming from his biliary stent. This will be some that will have to be watched very closely. I would think that the strap should be pretty sensitive.  I would hold off on his Xeloda for another extra week. I would want to make sure that this blood infection is treated.  If he continues to have temperature spikes, then I would worry about the biliary stent being a source for infection.  As always, he looks quite good.  I spent about a half hour with him.  I appreciate the outstanding care that he received from the hospitalist and the staff on [place]

## 2014-07-23 NOTE — Discharge Summary (Signed)
[facility]  Physician Discharge Summary   Patient ID: [name] [name] MRN: [id number] DOB/AGE: 1923/02/18 81 y.o.  Admit date: 07/19/2014 Discharge date: 07/23/2014  PCP: Drema Pry, DO  DISCHARGE DIAGNOSES:  Active Problems:   Type 2 diabetes mellitus   HYPERTENSION, BENIGN   Pancreatic cancer   Fever   RECOMMENDATIONS FOR OUTPATIENT FOLLOW UP: 1. IV ceftriaxone till 07/28/14. Followed by Amoxicillin for 2 weeks.   DISCHARGE CONDITION: fair  Diet recommendation: As tolerated  Filed Weights   07/20/14 0206 07/23/14 0700  Weight: 73.483 kg (162 lb) 73.483 kg (162 lb)    INITIAL HISTORY: 81 year old Caucasian male who was recently hospitalized for similar issues with fever and was discharged on December 18, on Levaquin. He lives in a skilled nursing facility. He presented with a fever of 103F. He has a history of metastatic pancreatic cancer. He was admitted for further management.  Consultations:  Oncology  Procedures:  None  HOSPITAL COURSE:   Fever of unknown origin/Strep Viridans Bacteremia  Patient with Strep viridans bacteremia in one out of two sets of blood cultures. This could be significant considering his malignancy and previous FUO. Discussed with Dr. Johnnye Sima. Will treat with Ceftriaxone for 7 days. And then with Amoxicillin for 2 weeks. No other source of infection. CT did suggest infiltrates but patient without significant pulmonary symptoms. Influenza PCR was negative. CT scan of the abdomen was done 1/1which revealed the pancreatic cancer and hepatic metastases, but no other infectious process in the abdomen. Lungs were noted to have possible infiltrates. He was changed over from Vanc and Zosyn to Ceftriaxone.  History of metastatic Pancreatic Cancer His chemotherapy was held while he was hospitalized. Xeloda can cause fevers. But since we now have a potential source, Xeloda can be resumed. Abdominal pain is due to his cancer. CT scan as below.  Seen by his oncologist. Pain medication regimen as before.  History of Bilateral DVT Doppler studies suggest a DVT in the left leg. This could be similar to what he had in October, rather than being a recurrence. Dose of Eliquis was reviewed with the pharmacist. He was on a lower dose than recommended. This was changed. Dose was increased on 1/2. No further change in treatment plan.   Essential Hypertension Continue home medications.  Type 2 Diabetes Mellitus Continue current medication regimen.   Macrocytic anemia Hemoglobin close to baseline.   Overall stable. Afebrile. [place] for discharge to SNF. IV antibiotics to be given through peripheral IV.   PERTINENT LABS:  The results of significant diagnostics from this hospitalization (including imaging, microbiology, ancillary and laboratory) are listed below for reference.    Microbiology: Recent Results (from the past 240 hour(s))  Blood culture (routine x 2)     Status: None   Collection Time: 07/19/14  8:15 PM  Result Value Ref Range Status   Specimen Description BLOOD R CHEST PORT  Final   Special Requests BOTTLES DRAWN AEROBIC AND ANAEROBIC 5ML  Final   Culture   Final    VIRIDANS STREPTOCOCCUS Note: THE SIGNIFICANCE OF ISOLATING THIS ORGANISM FROM A SINGLE SET OF BLOOD CULTURES WHEN MULTIPLE SETS ARE DRAWN IS UNCERTAIN. PLEASE NOTIFY THE MICROBIOLOGY DEPARTMENT WITHIN ONE WEEK IF SPECIATION AND SENSITIVITIES ARE REQUIRED. Note: Gram Stain Report Called to,Read Back By and Verified With: CINDY HUGHEY 07/21/14 @ 2:14PM BY RUSCOE A. Performed at [facility]    Report Status 07/22/2014 FINAL  Final  Urine culture     Status: None  Collection Time: 07/19/14 11:02 PM  Result Value Ref Range Status   Specimen Description URINE, CATHETERIZED  Final   Special Requests NONE  Final   Colony Count NO GROWTH Performed at [facility]   Final   Culture NO GROWTH Performed at [facility]   Final   Report  Status 07/22/2014 FINAL  Final  Blood culture (routine x 2)     Status: None (Preliminary result)   Collection Time: 07/20/14 12:33 AM  Result Value Ref Range Status   Specimen Description BLOOD LEFT ANTECUBITAL  Final   Special Requests BOTTLES DRAWN AEROBIC AND ANAEROBIC 5ML  Final   Culture   Final           BLOOD CULTURE RECEIVED NO GROWTH TO DATE CULTURE WILL BE HELD FOR 5 DAYS BEFORE ISSUING A FINAL NEGATIVE REPORT Performed at [facility]    Report Status PENDING  Incomplete     Labs: Basic Metabolic Panel:  Recent Labs Lab 07/19/14 2023 07/20/14 0500 07/21/14 0530 07/22/14 0500  NA 132* 137 137 133*  K 3.8 4.0 4.6 3.8  CL 98 103 103 100  CO2 26 27 28 27   GLUCOSE 217* 122* 132* 113*  BUN 24* 21 21 22   CREATININE 1.09 0.99 1.06 1.00  CALCIUM 8.8 8.8 7.5* 8.9   Liver Function Tests:  Recent Labs Lab 07/19/14 2023 07/20/14 0500 07/21/14 0530  AST 76* 57* 32  ALT 48 40 30  ALKPHOS 278* 240* 194*  BILITOT 0.6 0.9 0.9  PROT 6.7 6.1 6.2  ALBUMIN 3.4* 3.1* 3.0*   CBC:  Recent Labs Lab 07/19/14 2023 07/20/14 0500 07/21/14 0530 07/22/14 0500  WBC 5.7 5.0 4.0 4.6  NEUTROABS 4.5  --   --   --   HGB 10.0* 9.6* 10.3* 10.5*  HCT 29.9* 29.3* 30.6* 31.3*  MCV 100.7* 101.7* 101.7* 101.3*  PLT 168 161 179 190   CBG:  Recent Labs Lab 07/22/14 0742 07/22/14 1201 07/22/14 1658 07/22/14 2132 07/23/14 0751  GLUCAP 115* 184* 130* 182* 133*     IMAGING STUDIES Dg Chest 2 View  07/19/2014   CLINICAL DATA:  Fever  EXAM: CHEST  2 VIEW  COMPARISON:  07/04/2014  FINDINGS: There is mild cardiomegaly which is stable. Aortic and hilar contours are unchanged. Right IJ porta catheter, tip at the SVC level.  Streaky bibasilar lung opacities which favor atelectasis. No edema, effusion, or pneumothorax.  IMPRESSION: 1. Subsegmental bibasilar atelectasis. 2. Cardiomegaly without failure.   Electronically Signed   By: Jorje Guild M.D.   On: 07/19/2014 20:55    Ct Abdomen Pelvis W Contrast  07/20/2014   CLINICAL DATA:  abd pain, hx of HTN, diabetes, bladder ca-2014 with chemo instillation, fever of 103 with unknown origion with hx of pancreatic ca  EXAM: CT ABDOMEN AND PELVIS WITH CONTRAST  TECHNIQUE: Multidetector CT imaging of the abdomen and pelvis was performed using the standard protocol following bolus administration of intravenous contrast.  CONTRAST:  174mL OMNIPAQUE IOHEXOL 300 MG/ML  SOLN  COMPARISON:  For 06/04/2014  FINDINGS: Tiny bilateral pleural effusions. Coarse subsegmental atelectasis versus early airspace opacities in the posterior basal segments of both lower lobes. Coronary calcifications. Patent metallic biliary stent as evidenced by pneumobilia. There is a poorly marginated low-attenuation region in hepatic segment 7 measuring approximately 2.6 x 5 cm, previously 2.8 x 4.5 cm. There is a 2 cm segment 8 lesion without convincing interval change. There is a 14x 20 mm lesion in  hepatic segment 4 image 23 which was less conspicuous on the prior study. Unremarkable spleen, adrenal glands, kidneys. There is fatty infiltration of the pancreas with a poorly marginated mass in the pancreatic body partially encasing the proximal SMA and SMV.  Stomach is physiologically distended. Small bowel and colon are nondilated. Innumerable sigmoid diverticula without significant adjacent inflammatory/ edematous change. Urinary bladder incompletely distended. Multiple metallic seeds around the prostate. No ascites. No free air. Aortoiliac atheromatous calcifications. Spondylitic changes throughout the lumbar spine.  IMPRESSION: 1. New tiny pleural effusions with adjacent atelectasis/ infiltrate. 2. Stable poorly marginated hepatic lesions suggesting metastatic disease. 3. Little change in pancreatic mass with patent metallic biliary stent and pneumobilia.   Electronically Signed   By: Arne Cleveland M.D.   On: 07/20/2014 11:43    DISCHARGE EXAMINATION: Filed  Vitals:   07/22/14 1349 07/22/14 2203 07/23/14 0603 07/23/14 0700  BP: 109/53 118/52 119/57   Pulse: 51 54 58   Temp: 98 F (36.7 C) 98 F (36.7 C) 97.6 F (36.4 C)   TempSrc: Oral Oral Oral   Resp: 18 18 18    Height:    5\' 10"  (1.778 m)  Weight:    73.483 kg (162 lb)  SpO2: 98% 97% 98%    General appearance: alert, cooperative, appears stated age and no distress Resp: clear to auscultation bilaterally Cardio: regular rate and rhythm, S1, S2 normal, no murmur, click, rub or gallop GI: soft, non-tender; bowel sounds normal; no masses,  no organomegaly Extremities: extremities normal, atraumatic, no cyanosis or edema  DISPOSITION: SNF  Discharge Instructions    Call MD for:  difficulty breathing, headache or visual disturbances    Complete by:  As directed      Call MD for:  extreme fatigue    Complete by:  As directed      Call MD for:  persistant dizziness or light-headedness    Complete by:  As directed      Call MD for:  persistant nausea and vomiting    Complete by:  As directed      Call MD for:  severe uncontrolled pain    Complete by:  As directed      Call MD for:  temperature >100.4    Complete by:  As directed      Diet - low sodium heart healthy    Complete by:  As directed      Increase activity slowly    Complete by:  As directed            ALLERGIES: No Known Allergies   Current Discharge Medication List    START taking these medications   Details  amoxicillin (AMOXIL) 500 MG capsule Take 1 capsule (500 mg total) by mouth 3 (three) times daily. For 2 weeks starting 07/29/14 Qty: 30 capsule, Refills: 0    cefTRIAXone (ROCEPHIN) 20 MG/ML IVPB Inject 50 mLs (1 g total) into the vein daily. For 5 more days starting 07/24/14. Qty: 50 mL    !! LORazepam (ATIVAN) 0.5 MG tablet Take 1 tablet (0.5 mg total) by mouth at bedtime. Qty: 30 tablet, Refills: 0    Multiple Vitamin (MULTIVITAMIN WITH MINERALS) TABS tablet Take 1 tablet by mouth daily.     !! -  Potential duplicate medications found. Please discuss with provider.    CONTINUE these medications which have CHANGED   Details  apixaban (ELIQUIS) 5 MG TABS tablet Take 1 tablet (5 mg total) by mouth 2 (two) times daily.  HYDROcodone-acetaminophen (NORCO) 10-325 MG per tablet Take one tablet by mouth every 4 hours as needed for pain Qty: 20 tablet, Refills: 0    oxyCODONE (OXY IR/ROXICODONE) 5 MG immediate release tablet Take 1 tablet (5 mg total) by mouth 2 (two) times daily. Qty: 30 tablet, Refills: 0      CONTINUE these medications which have NOT CHANGED   Details  acetaminophen (TYLENOL) 325 MG tablet Take 650 mg by mouth once as needed for fever.    bisacodyl (DULCOLAX) 10 MG suppository Place 1 suppository (10 mg total) rectally daily as needed for moderate constipation. Qty: 12 suppository, Refills: 0    capecitabine (XELODA) 500 MG tablet Take 3 tablets (1,500 mg total) by mouth 2 (two) times daily after a meal. TAKES 3 TABS (1500) MG BID  ///  ON 14 DAYS OFF 7 DAYS FOR 3 CYCLES.   STARTED 06/06/14    carvedilol (COREG) 12.5 MG tablet Take 12.5 mg by mouth 2 (two) times daily with a meal.    docusate sodium 100 MG CAPS Take 100 mg by mouth 2 (two) times daily. Qty: 60 capsule, Refills: 0    feeding supplement, GLUCERNA SHAKE, (GLUCERNA SHAKE) LIQD Take 237 mLs by mouth 2 (two) times daily between meals as needed (If pt with <50% meal completion). Refills: 0    insulin glargine (LANTUS) 100 UNIT/ML injection Inject 3 Units into the skin at bedtime.    lidocaine-prilocaine (EMLA) cream Apply 1 application topically as needed. Qty: 30 g, Refills: 0   Associated Diagnoses: Malignant neoplasm of other specified sites of pancreas    !! LORazepam (ATIVAN) 0.5 MG tablet Take 2 tablets (1 mg total) by mouth every 6 (six) hours as needed (nausea/vomiting). Qty: 15 tablet, Refills: 0    nitroGLYCERIN (NITROSTAT) 0.4 MG SL tablet Place 1 tablet (0.4 mg total) under the tongue  every 5 (five) minutes as needed for chest pain. Qty: 90 tablet, Refills: 3   Associated Diagnoses: Chest pain    Omega-3 Fatty Acids (FISH OIL) 1000 MG CAPS Take 1,000 mg by mouth every morning.    Associated Diagnoses: Anemia, unspecified anemia type; Anemia of chronic disease; Metastatic cancer; Malignant neoplasm of urinary bladder, unspecified site; Liver metastasis; Malignant neoplasm of pancreas, unspecified location of malignancy    ondansetron (ZOFRAN) 8 MG tablet Take by mouth 2 (two) times daily as needed for nausea or vomiting.     polyethylene glycol (MIRALAX / GLYCOLAX) packet Take 17 g by mouth 2 (two) times daily as needed for mild constipation.    polyvinyl alcohol (LIQUIFILM TEARS) 1.4 % ophthalmic solution Place 1 drop into both eyes 4 (four) times daily.    pravastatin (PRAVACHOL) 40 MG tablet Take 40 mg by mouth every evening.     pregabalin (LYRICA) 75 MG capsule Take 75 mg by mouth every morning.     prochlorperazine (COMPAZINE) 5 MG tablet Take 5 mg by mouth every 6 (six) hours as needed for nausea or vomiting.    ranitidine (ZANTAC) 150 MG tablet Take 150 mg by mouth 2 (two) times daily.    temazepam (RESTORIL) 15 MG capsule Take 15 mg by mouth at bedtime.      !! - Potential duplicate medications found. Please discuss with provider.     Follow-up Information    Follow up with Drema Pry, DO. Schedule an appointment as soon as possible for a visit in 1 week.   Specialty:  Internal Medicine   Why:  post hospitalization follow up  Contact information:   [place] [address] [phone]       Follow up with Volanda Napoleon, MD. Schedule an appointment as soon as possible for a visit in 2 weeks.   Specialty:  Oncology   Contact information:   [address] [place] [address] [phone]       TOTAL DISCHARGE TIME: 18 mins  [facility] Pager  [phone]  07/23/2014, 10:18 AM

## 2014-07-23 NOTE — Care Management (Signed)
CARE MANAGEMENT NOTE 07/23/2014  Patient:  [name] [name], [name] [name]   Account Number:  [id number]  Date Initiated:  07/23/2014  Documentation initiated by:  Marney Doctor  Subjective/Objective Assessment:   81 yo admitted with Type 2 Diabetes     Action/Plan:   Pt from [facility] and plans to go back there at DC   Anticipated DC Date:  07/23/2014   Anticipated DC Plan:  [place]  In-house referral  Clinical Social Worker      DC Planning Services  CM consult      Choice offered to / List presented to:             Status of service:  Completed, signed off Medicare Important Message given?  YES (If response is "NO", the following Medicare IM given date fields will be blank) Date Medicare IM given:  07/23/2014 Medicare IM given by:  Marney Doctor Date Additional Medicare IM given:   Additional Medicare IM given by:    Discharge Disposition:    Per UR Regulation:    If discussed at Long Length of Stay Meetings, dates discussed:    Comments:  Marney Doctor RN,BSN,NCM

## 2014-07-23 NOTE — Telephone Encounter (Signed)
Patient's family member called and cx 07/23/14 due to patient being in the hospital.  Nurse was notified of cx apt

## 2014-07-23 NOTE — Progress Notes (Signed)
Pt for discharge to [facility].  CSW facilitated pt discharge needs including contacting facility, faxing pt discharge information via [facility], discussing with pt at bedside and pt friend, Mr. Christena Flake who plans to provide transportation via car, notifying pt HCPOA, [place] via telephone, providing RN phone number to call report, and providing discharge packet at bedside for pt to provide to [facility] once pt arrives.  Pt eager to get back to [facility] SNF.   No further social work needs identified at this time.  CSW signing off.   Alison Murray, MSW, [place] Work [phone]

## 2014-07-23 NOTE — Progress Notes (Addendum)
CSW continuing to follow.  Pt from [facility] and plans to return when medically ready for discharge.  CSW received request from MD to check with [facility] to determine if facility can provide pt with IV antibiotics through pt port for 6 more days. If facility can provide IV antibiotics then pt can d/c back to [facility] today.  CSW contacted [facility] and left message. Awaiting return phone call.  CSW to continue to follow.  Addendum 10:47 am:  CSW received return phone call from [facility] stating that facility could do IV abx, but not through pt port and through a peripheral IV. Per facility, facility can accept pt back today.   CSW notified MD and RN and plan is for discharge today.  CSW visited pt room, but pt sleeping soundly at this time. CSW contacted pt HCPOA, [place] via telephone to notify that pt will be discharging today and [facility] will be able to provide remainder of IV antibiotics. Pt HCPOA expressed understanding and discussed that MD had discussed this with her yesterday.   CSW was notified by RN that pt local caregiver, Eber Hong was on telephone. CSW spoke to [facility] who stated that she plans to transport pt via private vehicle when medically ready. Pt caregiver, Kalman Shan provided her phone number to this CSW in order to arrange with [place] for transportation.  CSW to continue to follow and facilitate pt discharge needs this afternoon.  Alison Murray, MSW, [place] Work [phone]

## 2014-07-23 NOTE — Discharge Instructions (Signed)
Bacteremia °Bacteremia occurs when bacteria get in your blood. Normal blood does not usually have bacteria. Bacteremia is one way infections can spread from one part of the body to another. °CAUSES  °· Causes may include anything that allows bacteria to get into the body. Examples are: °¨ Catheters. °¨ Intravenous (IV) access tubes. °¨ Cuts or scrapes of the skin. °· Temporary bacteremia may occur during dental procedures, while brushing your teeth, or during a bowel movement. This rarely causes any symptoms or medical problems. °· Bacteria may also get in the bloodstream as a complication of a bacterial infection elsewhere. This includes infected wounds and bacterial infections of the: °¨ Lungs (pneumonia). °¨ Kidneys (pyelonephritis). °¨ Intestines (enteritis, colitis). °¨ Organs in the abdomen (appendicitis, cholecystitis, diverticulitis). °SYMPTOMS  °The body is usually able to clear small numbers of bacteria out of the blood quickly. Brief bacteremia usually does not cause problems.  °· Problems can occur if the bacteria start to grow in number or spread to other parts of the body. If the bacteria start growing, you may develop: °¨ Chills. °¨ Fever. °¨ Nausea. °¨ Vomiting. °¨ Sweating. °¨ Lightheadedness and low blood pressure. °¨ Pain. °· If bacteria start to grow in the linings around the brain, it is called meningitis. This can cause severe headaches, many other problems, and even death. °· If bacteria start to grow in a joint, it causes arthritis with painful joints. If bacteria start to grow in a bone, it is called osteomyelitis. °· Bacteria from the blood can also cause sores (abscesses) in many organs, such as the muscle, liver, spleen, lungs, brain, and kidneys. °DIAGNOSIS  °· This condition is diagnosed by cultures of the blood. °· Cultures may also be taken from other parts of the body that are thought to be causing the bacteremia. A small piece of tissue, fluid, or other product of the body is  sampled. The sample is then put on a growth plate to see if any bacteria grows. °· Other lab tests may be done and the results may be abnormal. °TREATMENT  °Treatment requires a stay in the hospital. You will be given antibiotic medicine through an IV access tube. °PREVENTION  °People with an increased risk of developing bacteremia or complications may be given antibiotics before certain procedures. Examples are: °· A person with a heart murmur or artificial heart valve, before having his or her teeth cleaned. °· Before having a surgical or other invasive procedure. °· Before having a bowel procedure. °Document Released: 04/19/2006 Document Revised: 09/28/2011 Document Reviewed: 01/29/2011 °ExitCare® Patient Information ©2015 [employer]. This information is not intended to replace advice given to you by your health care provider. Make sure you discuss any questions you have with your health care provider. ° °

## 2014-07-23 NOTE — Progress Notes (Signed)
Physical Therapy Treatment Patient Details Name: [name] [name] MRN: [id number] DOB: [birth year] Today's Date: 07/23/2014    History of Present Illness 81 yo male with h/o stage 4 pancreatic cancer with liver mets, DVT, bil TKA, chronic systolic heart failure/cardiomyopathy admitted for fever from SNF.    PT Comments    Pt was seen for final visit today with plan in place to go to SNF.  He is presenting a number of concerns about awareness of his environment and problem solving due to his lines being distracting for him.  In fact, he originally asked PT to just wait until IV was removed to walk him.  Discharge plan is very appropriate, and reminded him to get help to go to [facility].  Follow Up Recommendations  SNF     Equipment Recommendations  None recommended by PT    Recommendations for Other Services       Precautions / Restrictions Precautions Precautions: Fall Restrictions Weight Bearing Restrictions: No    Mobility  Bed Mobility Overal bed mobility: Needs Assistance Bed Mobility: Supine to Sit;Sit to Supine     Supine to sit: Min guard Sit to supine: Min guard;Min assist   General bed mobility comments: assisted with trunk  Transfers Overall transfer level: Needs assistance Equipment used: Rolling walker (2 wheeled) Transfers: Sit to/from [facility] Sit to Stand: Min assist Stand pivot transfers: Min assist       General transfer comment: Pt had been dizzy early and now is feeling somewhat better  Ambulation/Gait Ambulation/Gait assistance: Min assist;Min guard Ambulation Distance (Feet): 250 Feet Assistive device: Rolling walker (2 wheeled) Gait Pattern/deviations: Step-through pattern;Decreased stride length;Trunk flexed;Wide base of support Gait velocity: decreased Gait velocity interpretation: Below normal speed for age/gender General Gait Details: RW and assist with IV to get around the unit, with pt demonstrating some limited awareness of  obstacles and to generally plan his path   Stairs            Wheelchair Mobility    Modified Rankin (Stroke Patients Only)       Balance Overall balance assessment: Needs assistance Sitting-balance support: Feet supported Sitting balance-Leahy Scale: Good   Postural control: Posterior lean Standing balance support: Bilateral upper extremity supported Standing balance-Leahy Scale: Poor Standing balance comment: Pt is distracted by IV and foley lines                    Cognition Arousal/Alertness: Awake/alert Behavior During Therapy: WFL for tasks assessed/performed Overall Cognitive Status: Within Functional Limits for tasks assessed                      Exercises      General Comments General comments (skin integrity, edema, etc.): Pt is very appropriate for short stay in rehab as he presents a number of safety concerns and limited awareness of things that could increase fall potential      Pertinent Vitals/Pain Pain Assessment: No/denies pain    Home Living                      Prior Function            PT Goals (current goals can now be found in the care plan section) Acute Rehab PT Goals Patient Stated Goal: to go back to [facility] Progress towards PT goals: Progressing toward goals    Frequency  Min 3X/week    PT Plan Current plan remains appropriate  Co-evaluation             End of Session Equipment Utilized During Treatment: Other (comment) (FWW) Activity Tolerance: Patient tolerated treatment well Patient left: in bed;with call bell/phone within reach;with bed alarm set     Time: 1235-1259 PT Time Calculation (min) (ACUTE ONLY): 24 min  Charges:  $Gait Training: 8-22 mins $Therapeutic Activity: 8-22 mins                    G Codes:      [name] Aug 22, 2014, 2:08 PM  [name], PT MS Acute Rehab Dept. Number: [phone]

## 2014-07-24 ENCOUNTER — Telehealth: Payer: Self-pay | Admitting: Hematology & Oncology

## 2014-07-24 NOTE — Telephone Encounter (Signed)
Pt called aware of 1-13 appointment per MD

## 2014-07-25 ENCOUNTER — Non-Acute Institutional Stay (SKILLED_NURSING_FACILITY): Payer: Medicare Other | Admitting: Nurse Practitioner

## 2014-07-25 ENCOUNTER — Encounter: Payer: Self-pay | Admitting: Nurse Practitioner

## 2014-07-25 DIAGNOSIS — R1010 Upper abdominal pain, unspecified: Secondary | ICD-10-CM

## 2014-07-25 DIAGNOSIS — K5900 Constipation, unspecified: Secondary | ICD-10-CM

## 2014-07-25 DIAGNOSIS — I82402 Acute embolism and thrombosis of unspecified deep veins of left lower extremity: Secondary | ICD-10-CM

## 2014-07-25 DIAGNOSIS — I10 Essential (primary) hypertension: Secondary | ICD-10-CM

## 2014-07-25 DIAGNOSIS — I5022 Chronic systolic (congestive) heart failure: Secondary | ICD-10-CM

## 2014-07-25 DIAGNOSIS — E871 Hypo-osmolality and hyponatremia: Secondary | ICD-10-CM

## 2014-07-25 DIAGNOSIS — G609 Hereditary and idiopathic neuropathy, unspecified: Secondary | ICD-10-CM

## 2014-07-25 DIAGNOSIS — K219 Gastro-esophageal reflux disease without esophagitis: Secondary | ICD-10-CM

## 2014-07-25 DIAGNOSIS — E1140 Type 2 diabetes mellitus with diabetic neuropathy, unspecified: Secondary | ICD-10-CM

## 2014-07-25 DIAGNOSIS — R509 Fever, unspecified: Secondary | ICD-10-CM

## 2014-07-25 DIAGNOSIS — G4700 Insomnia, unspecified: Secondary | ICD-10-CM

## 2014-07-25 NOTE — Progress Notes (Signed)
Patient ID: [name] [name], male   DOB: 03-26-1923, 81 y.o.   MRN: [id number]   Code Status: Full code  No Known Allergies  Chief Complaint  Patient presents with  . Medical Management of Chronic Issues  . Hospitalization Follow-up    HPI: Patient is a 81 y.o. male seen in the SNF at [facility] today for evaluation of chronic medical conditions.    Hospitalized 07/19/2014-07/23/2014 for Type 2 diabetes mellitus HYPERTENSION, BENIGN Pancreatic cancer Fever. He was recently hospitalized for similar issues with fever and was discharged on December 18, on Levaquin. He lives in a skilled nursing facility. He presented with a fever of 103F. He has a history of metastatic pancreatic cancer.    Hospitalized 07/02/14-07/06/14 for fever 104-resolved once ABT started. Negative urine culture, CXR, and blood culture.     Hospitalized 03/29/2014 -04/04/2014 for pancreatic mass, FTT, CHF. He was admitted 03/29/14 with chief complaint of malaise, decreased appetite, generalized weakness and hematuria. A CT scan done on admission showed a 4.9 cm enhancing lesion adjacent to the pancreatic head/uncinate process worrisome for a primary pancreatic adenocarcinoma and suspected multifocal hepatic metastasis. This mass was noted back in June when his urologist ordered a CT scan of his abdomen/pelvis, and he was referred to GI as an outpatient for further evaluation. An EUS with biopsy was planned however the patient did not hold his Coumadin as instructed and the biopsy was canceled. The mass was not detectable on the ultrasound. Further evaluation was subsequently halted given the patient's overall functional status and comorbidities. His wife died 3 weeks ago.    Problem List Items Addressed This Visit    Type 2 diabetes mellitus    12/20/13 Hgb A1c 7.3 04/09/14 CBG am 150-180, pm 186/235-adding Lantus 3 units qd qhs.  05/28/14 update Hgb A1c 7.1 06/27/14 continue Lantus 3 units daily.    SYSTOLIC HEART  FAILURE, CHRONIC    Compensated clinically.     Insomnia    Takes Temazepam 15mg  qhs and Lorazepam 0.5mg  qhs for sleep.      Hyponatremia    04/10/14 Na 137 05/01/14 Na 135 05/29/14 Na 136 07/2014 Na 133 while in hospital.      HYPERTENSION, BENIGN    Blood pressure controlled on Coreg 12.5mg  bid.      Hereditary and idiopathic peripheral neuropathy    Takes Pregabalin 75mg  daily. Ambulates with walker on unit      GERD    Takes ranitidine 150mg  bid.       FUO (fever of unknown origin) - Primary    IV ceftriaxone till 07/28/14. Followed by Amoxicillin for 2 weeks.     Constipation    Adequately managed with MiraLax bid prn, Enulose 15mg  bid prn, Colace 100mg  bid, Bisacodyl 10mg  suppository qd prn.      Acute thromboembolism of deep veins of lower extremity    05/04/14 Doppler LLE: DVT at left femoral and popliteal veins not completely occlusive. anticoagulate with Eliquis 2.5mg  bid.       Abdominal pain    Due to pancreatic cancer. Takes Oxycodone bid for pain control.        Review of Systems:  Review of Systems  Constitutional: Positive for fever and weight loss. Negative for chills, malaise/fatigue and diaphoresis.       About #15 Ibs in the past year. Generalized weakness.  HENT: Negative for congestion, ear discharge, ear pain, hearing loss, nosebleeds, sore throat and tinnitus.   Eyes: Negative for blurred vision,  double vision, photophobia, pain, discharge and redness.  Respiratory: Negative for cough, hemoptysis, sputum production, shortness of breath, wheezing and stridor.   Cardiovascular: Positive for leg swelling. Negative for chest pain, palpitations, orthopnea, claudication and PND.       New LLE  Gastrointestinal: Positive for abdominal pain. Negative for heartburn, nausea, vomiting, diarrhea, constipation, blood in stool and melena.       Epigastric area dull pain. He said it has been hurting for a long time  Genitourinary: Positive for frequency.  Negative for dysuria, urgency, hematuria and flank pain.  Musculoskeletal: Negative for myalgias, back pain, joint pain, falls and neck pain.  Skin: Negative for itching and rash.       Port A Cath Intergluteal cleft redness.   Neurological: Positive for weakness. Negative for dizziness, tingling, tremors, sensory change, speech change, focal weakness, seizures, loss of consciousness and headaches.  Endo/Heme/Allergies: Negative for environmental allergies and polydipsia. Does not bruise/bleed easily.  Psychiatric/Behavioral: Negative for depression, suicidal ideas, hallucinations, memory loss and substance abuse. The patient is not nervous/anxious and does not have insomnia.      Past Medical History  Diagnosis Date  . Diverticulosis of colon (without mention of hemorrhage)   . Irritable bowel syndrome   . Personal history of malignant neoplasm of prostate     s/p  radioactive seed implants 2000  . Unspecified hereditary and idiopathic peripheral neuropathy   . Nonischemic cardiomyopathy cardiologist-  dr Aundra Dubin    EF 25%  per last echo 2012 and cardiologist note--  2007-  ef  40-45%;  2008- ef 35-40%;  2010- ef 30-35%;  2012- ef 25%  . Sinus bradycardia   . Hypertension   . Arthritis     hands  . Hyperlipidemia   . History of DVT of lower extremity     2002  ,   left leg  . History of adenomatous polyp of colon     2001  . History of basal cell carcinoma excision   . Anticoagulated on Coumadin   . Type 2 diabetes, diet controlled   . GERD (gastroesophageal reflux disease)   . LBBB (left bundle branch block)   . Wears glasses   . Wears hearing aid     BILATERAL  . Unsteady gait   . DDD (degenerative disc disease)     cervical and lumbar  . DJD (degenerative joint disease)     hips  . Lower urinary tract symptoms (LUTS)   . 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 Coronary artery  disease     per cath 2002  non-obstructive cad  . Chronic systolic heart failure   . H/O fracture of nose     12-30-2013-  pt fell at home open nose bone fx   Past Surgical History  Procedure Laterality Date  . Knee arthroscopy Left 1991  . Ercp with sphincterotomy and stone removal  05/ 1993  &  05-11-2006  . Total knee arthroplasty  10/05/2011    Procedure: TOTAL KNEE ARTHROPLASTY;  Surgeon: Gearlean Alf, MD;  Location: [facility];  Service: Orthopedics;  Laterality: Right;  . Transurethral resection of bladder tumor N/A 09/30/2012    Procedure: TRANSURETHRAL RESECTION OF BLADDER TUMOR (TURBT);  Surgeon: Franchot Gallo, MD;  Location: [facility];  Service: Urology;  Laterality: N/A;  WITH MITOMYCIN INSTILLATION   . Eus N/A 01/04/2014    Procedure: ESOPHAGEAL  ENDOSCOPIC ULTRASOUND (EUS) RADIAL;  Surgeon: [name], MD;  Location: [facility];  Service: Endoscopy;  Laterality: N/A;  . Radioactive prostate seed implants  01/ 2000  . Mohs surgery  03/ 2009    LEFT EAR BASAL CELL CARCINOMA  . Tonsillectomy  AS CHILD  . Cholecystectomy open  1985  . Appendectomy  child  . Inguinal hernia repair Bilateral 1998    recurrent RIH  w/ repair 06-21-2006  . Total knee arthroplasty Left 02-21-2007    AND 04-21-2007  CLOSED MANIPULATION  . Cataract extraction w/ intraocular lens implant Right 2004  . Lumbar spine surgery  X2  . Cardiac catheterization  06-14-2001  dr Darnell Level brodie    non-obstructive CAD/  LM 30%,  pLAD 50%,  mLAD 30%,  RCA  &  CFX  irregularites ,  global hypokinesis,  ef 40%  . Cardiovascular stress test  02-07-2007   dr Darnell Level brodie    negative for ischemia,  low risk myoview perfusion study  . Transthoracic echocardiogram  01-28-2011    moderate LVH/  ef 25%/  diffuse hypokinesis/  mild LAE/  mild TR/  trivial AR & MR  . Cystoscopy w/ retrogrades Bilateral 03/09/2014    Procedure: CYSTOSCOPY WITH BILATERAL RETROGRADE PYELOGRAM BLADDER BX AND RENAL WASHINGS.;  Surgeon:  Jorja Loa, MD;  Location: [facility];  Service: Urology;  Laterality: Bilateral;  . Eus N/A 04/05/2014    Procedure: UPPER ENDOSCOPIC ULTRASOUND (EUS) LINEAR;  Surgeon: [name], MD;  Location: [facility];  Service: Endoscopy;  Laterality: N/A;  radial linear  . Endoscopic retrograde cholangiopancreatography (ercp) with propofol N/A 04/12/2014    Procedure: ENDOSCOPIC RETROGRADE CHOLANGIOPANCREATOGRAPHY (ERCP) WITH PROPOFOL;  Surgeon: [name], MD;  Location: [facility];  Service: Endoscopy;  Laterality: N/A;  metal stent  . Biliary stent placement N/A 04/12/2014    Procedure: BILIARY STENT PLACEMENT;  Surgeon: [name], MD;  Location: [facility];  Service: Endoscopy;  Laterality: N/A;   Social History:   reports that he quit smoking about 31 years ago. His smoking use included Cigarettes. He started smoking about 60 years ago. He has a 10 pack-year smoking history. He has never used smokeless tobacco. He reports that he does not drink alcohol or use illicit drugs.  Family History  Problem Relation Age of Onset  . Throat cancer Brother   . Coronary artery disease Father     deceased    Medications: Patient's Medications  New Prescriptions   No medications on file  Previous Medications   ACETAMINOPHEN (TYLENOL) 325 MG TABLET    Take 650 mg by mouth once as needed for fever.   AMOXICILLIN (AMOXIL) 500 MG CAPSULE    Take 1 capsule (500 mg total) by mouth 3 (three) times daily. For 2 weeks starting 07/29/14   APIXABAN (ELIQUIS) 5 MG TABS TABLET    Take 1 tablet (5 mg total) by mouth 2 (two) times daily.   BISACODYL (DULCOLAX) 10 MG SUPPOSITORY    Place 1 suppository (10 mg total) rectally daily as needed for moderate constipation.   CAPECITABINE (XELODA) 500 MG TABLET    Take 3 tablets (1,500 mg total) by mouth 2 (two) times daily after a meal. TAKES 3 TABS (1500) MG BID  ///  ON 14 DAYS OFF 7 DAYS FOR 3 CYCLES.   STARTED 06/06/14   CARVEDILOL  (COREG) 12.5 MG TABLET    Take 12.5 mg by mouth 2 (two) times daily with a  meal.   CEFTRIAXONE (ROCEPHIN) 20 MG/ML IVPB    Inject 50 mLs (1 g total) into the vein daily. For 5 more days starting 07/24/14.   DOCUSATE SODIUM 100 MG CAPS    Take 100 mg by mouth 2 (two) times daily.   FEEDING SUPPLEMENT, GLUCERNA SHAKE, (GLUCERNA SHAKE) LIQD    Take 237 mLs by mouth 2 (two) times daily between meals as needed (If pt with <50% meal completion).   HYDROCODONE-ACETAMINOPHEN (NORCO) 10-325 MG PER TABLET    Take one tablet by mouth every 4 hours as needed for pain   INSULIN GLARGINE (LANTUS) 100 UNIT/ML INJECTION    Inject 3 Units into the skin at bedtime.   LIDOCAINE-PRILOCAINE (EMLA) CREAM    Apply 1 application topically as needed.   LORAZEPAM (ATIVAN) 0.5 MG TABLET    Take 2 tablets (1 mg total) by mouth every 6 (six) hours as needed (nausea/vomiting).   LORAZEPAM (ATIVAN) 0.5 MG TABLET    Take 1 tablet (0.5 mg total) by mouth at bedtime.   MULTIPLE VITAMIN (MULTIVITAMIN WITH MINERALS) TABS TABLET    Take 1 tablet by mouth daily.   NITROGLYCERIN (NITROSTAT) 0.4 MG SL TABLET    Place 1 tablet (0.4 mg total) under the tongue every 5 (five) minutes as needed for chest pain.   OMEGA-3 FATTY ACIDS (FISH OIL) 1000 MG CAPS    Take 1,000 mg by mouth every morning.    ONDANSETRON (ZOFRAN) 8 MG TABLET    Take by mouth 2 (two) times daily as needed for nausea or vomiting.    OXYCODONE (OXY IR/ROXICODONE) 5 MG IMMEDIATE RELEASE TABLET    Take 1 tablet (5 mg total) by mouth 2 (two) times daily.   POLYETHYLENE GLYCOL (MIRALAX / GLYCOLAX) PACKET    Take 17 g by mouth 2 (two) times daily as needed for mild constipation.   POLYVINYL ALCOHOL (LIQUIFILM TEARS) 1.4 % OPHTHALMIC SOLUTION    Place 1 drop into both eyes 4 (four) times daily.   PRAVASTATIN (PRAVACHOL) 40 MG TABLET    Take 40 mg by mouth every evening.    PREGABALIN (LYRICA) 75 MG CAPSULE    Take 75 mg by mouth every morning.    PROCHLORPERAZINE (COMPAZINE) 5 MG  TABLET    Take 5 mg by mouth every 6 (six) hours as needed for nausea or vomiting.   RANITIDINE (ZANTAC) 150 MG TABLET    Take 150 mg by mouth 2 (two) times daily.   TEMAZEPAM (RESTORIL) 15 MG CAPSULE    Take 15 mg by mouth at bedtime.   Modified Medications   No medications on file  Discontinued Medications   No medications on file     Physical Exam: Physical Exam  Constitutional: He is oriented to person, place, and time. He appears well-developed and well-nourished.  HENT:  Head: Normocephalic and atraumatic.  Mouth/Throat: Oropharynx is clear and moist.  Eyes: Conjunctivae and EOM are normal. Pupils are equal, round, and reactive to light. Right eye exhibits no discharge. Left eye exhibits no discharge. No scleral icterus.  Neck: Normal range of motion. Neck supple. No JVD present. No tracheal deviation present. No thyromegaly present.  Cardiovascular: Normal rate, regular rhythm and normal heart sounds.   Pulmonary/Chest: Effort normal and breath sounds normal. No stridor. He has no wheezes. He has no rales. He exhibits no tenderness.  Abdominal: Soft. Bowel sounds are normal. He exhibits no distension. There is no tenderness. There is no rebound and no guarding.  Minimal  epigastric tenderness.  No ascites  Musculoskeletal: Normal range of motion. He exhibits edema. He exhibits no tenderness.  LLE  Lymphadenopathy:    He has no cervical adenopathy.  Neurological: He is alert and oriented to person, place, and time. No cranial nerve deficit.  Skin: Skin is warm and dry. No rash noted. No erythema. No pallor.  Intergluteal cleft redness.   Psychiatric: He has a normal mood and affect. His behavior is normal.    Filed Vitals:   07/25/14 1546  BP: 99/66  Pulse: 60  Temp: 98.8 F (37.1 C)  TempSrc: Tympanic  Resp: 18      Labs reviewed: Basic Metabolic Panel:  Recent Labs  05/29/14  07/20/14 0500 07/21/14 0530 07/22/14 0500  NA 136*  < > 137 137 133*  K 4.3  < >  4.0 4.6 3.8  CL  --   < > 103 103 100  CO2  --   < > 27 28 27   GLUCOSE  --   < > 122* 132* 113*  BUN 22*  < > 21 21 22   CREATININE 1.0  < > 0.99 1.06 1.00  CALCIUM  --   < > 8.8 7.5* 8.9  TSH 1.15  --  0.846  --   --   < > = values in this interval not displayed. Liver Function Tests:  Recent Labs  07/19/14 2023 07/20/14 0500 07/21/14 0530  AST 76* 57* 32  ALT 48 40 30  ALKPHOS 278* 240* 194*  BILITOT 0.6 0.9 0.9  PROT 6.7 6.1 6.2  ALBUMIN 3.4* 3.1* 3.0*    Recent Labs  12/19/13 1514 03/29/14 1610 07/02/14 1845  LIPASE 8.0* 10* 7*   No results for input(s): AMMONIA in the last 8760 hours. CBC:  Recent Labs  07/02/14 1643  07/11/14 1152 07/19/14 2023 07/20/14 0500 07/21/14 0530 07/22/14 0500  WBC 4.9  < > 5.7 5.7 5.0 4.0 4.6  NEUTROABS 4.0  --  3.8 4.5  --   --   --   HGB 10.2*  < > 11.3* 10.0* 9.6* 10.3* 10.5*  HCT 31.0*  < > 33.9* 29.9* 29.3* 30.6* 31.3*  MCV 99.0  < > 102* 100.7* 101.7* 101.7* 101.3*  PLT 154  < > 208 168 161 179 190  < > = values in this interval not displayed. Lipid Panel: No results for input(s): CHOL, HDL, LDLCALC, TRIG, CHOLHDL, LDLDIRECT in the last 8760 hours.  Past Procedures:  03/29/14 CT abd and pelvis w CM:  IMPRESSION:  4.9 cm enhancing lesion adjacent to the pancreatic head/uncinate  process, worrisome for primary pancreatic adenocarcinoma,  progressed.  Tumor likely involves the SMA/SMV and undersurface of the portal  vein. 9 mm short axis node in the porta hepatitis.  Suspected multifocal hepatic metastases, including a 3.2 cm lesion  in the right hepatic dome.   03/30/14 MR w wo CM abd:   IMPRESSION:  1. Subtle amorphous mass in the pancreatic head and proximal  pancreatic body currently measuring 4.4 x 3.2 cm, causing distal  common bile duct obstruction and demonstrating vascular involvement  (SMA, SMV, splenic vein, splenic portal confluence and proximal  portal vein), as above, highly concerning for primary  pancreatic  neoplasm.  2. Although there do appear to be multiple ill-defined hepatic  lesions, concerning for metastatic disease, these were poorly  evaluated on today's examination secondary to patient respiratory  motion. Continued attention on any future followup studies is  recommended, as metastatic disease to  the liver is suspected.  3. Severe intra and extrahepatic biliary ductal dilatation.   05/04/14 Doppler LLE: DVT at left femoral and popliteal veins not completely occlusive. Will anticoagulate with Eliquis 2.5mg  bid.    07/20/14 CT abd and pelvis:    IMPRESSION: 1. New tiny pleural effusions with adjacent atelectasis/ infiltrate. 2. Stable poorly marginated hepatic lesions suggesting metastatic disease. 3. Little change in pancreatic mass with patent metallic biliary stent and pneumobilia.   Assessment/Plan FUO (fever of unknown origin) IV ceftriaxone till 07/28/14. Followed by Amoxicillin for 2 weeks.   Hyponatremia 04/10/14 Na 137 05/01/14 Na 135 05/29/14 Na 136 07/2014 Na 133 while in hospital.    Acute thromboembolism of deep veins of lower extremity 05/04/14 Doppler LLE: DVT at left femoral and popliteal veins not completely occlusive. anticoagulate with Eliquis 2.5mg  bid.     Constipation Adequately managed with MiraLax bid prn, Enulose 15mg  bid prn, Colace 100mg  bid, Bisacodyl 10mg  suppository qd prn.    GERD Takes ranitidine 150mg  bid.     Hereditary and idiopathic peripheral neuropathy Takes Pregabalin 75mg  daily. Ambulates with walker on unit    SYSTOLIC HEART FAILURE, CHRONIC Compensated clinically.   Type 2 diabetes mellitus 12/20/13 Hgb A1c 7.3 04/09/14 CBG am 150-180, pm 186/235-adding Lantus 3 units qd qhs.  05/28/14 update Hgb A1c 7.1 06/27/14 continue Lantus 3 units daily.  Insomnia Takes Temazepam 15mg  qhs and Lorazepam 0.5mg  qhs for sleep.    HYPERTENSION, BENIGN Blood pressure controlled on Coreg 12.5mg  bid.    Abdominal  pain Due to pancreatic cancer. Takes Oxycodone bid for pain control.     Family/ Staff Communication: observe the patient.   Goals of Care: SNF  Labs/tests ordered: none

## 2014-07-26 LAB — CULTURE, BLOOD (ROUTINE X 2): CULTURE: NO GROWTH

## 2014-07-26 NOTE — Assessment & Plan Note (Signed)
05/04/14 Doppler LLE: DVT at left femoral and popliteal veins not completely occlusive. anticoagulate with Eliquis 2.5mg  bid.

## 2014-07-26 NOTE — Assessment & Plan Note (Signed)
IV ceftriaxone till 07/28/14. Followed by Amoxicillin for 2 weeks.

## 2014-07-26 NOTE — Assessment & Plan Note (Signed)
Due to pancreatic cancer. Takes Oxycodone bid for pain control.

## 2014-07-26 NOTE — Assessment & Plan Note (Signed)
Compensated clinically.  

## 2014-07-26 NOTE — Assessment & Plan Note (Signed)
Adequately managed with MiraLax bid prn, Enulose 15mg  bid prn, Colace 100mg  bid, Bisacodyl 10mg  suppository qd prn.

## 2014-07-26 NOTE — Assessment & Plan Note (Signed)
04/10/14 Na 137 05/01/14 Na 135 05/29/14 Na 136 07/2014 Na 133 while in hospital.

## 2014-07-26 NOTE — Assessment & Plan Note (Signed)
Blood pressure controlled on Coreg 12.5mg  bid.

## 2014-07-26 NOTE — Assessment & Plan Note (Signed)
12/20/13 Hgb A1c 7.3 04/09/14 CBG am 150-180, pm 186/235-adding Lantus 3 units qd qhs.  05/28/14 update Hgb A1c 7.1 06/27/14 continue Lantus 3 units daily.

## 2014-07-26 NOTE — Assessment & Plan Note (Signed)
Takes Pregabalin 75mg  daily. Ambulates with walker on unit

## 2014-07-26 NOTE — Assessment & Plan Note (Signed)
Takes ranitidine 150mg  bid.

## 2014-07-26 NOTE — Assessment & Plan Note (Signed)
Takes Temazepam 15mg  qhs and Lorazepam 0.5mg  qhs for sleep.

## 2014-08-01 ENCOUNTER — Other Ambulatory Visit (HOSPITAL_BASED_OUTPATIENT_CLINIC_OR_DEPARTMENT_OTHER): Payer: Medicare Other | Admitting: Lab

## 2014-08-01 ENCOUNTER — Encounter: Payer: Self-pay | Admitting: Family

## 2014-08-01 ENCOUNTER — Ambulatory Visit (HOSPITAL_BASED_OUTPATIENT_CLINIC_OR_DEPARTMENT_OTHER): Payer: Medicare Other | Admitting: Family

## 2014-08-01 DIAGNOSIS — C250 Malignant neoplasm of head of pancreas: Secondary | ICD-10-CM

## 2014-08-01 DIAGNOSIS — R7989 Other specified abnormal findings of blood chemistry: Secondary | ICD-10-CM | POA: Diagnosis not present

## 2014-08-01 DIAGNOSIS — C787 Secondary malignant neoplasm of liver and intrahepatic bile duct: Secondary | ICD-10-CM

## 2014-08-01 DIAGNOSIS — C251 Malignant neoplasm of body of pancreas: Secondary | ICD-10-CM

## 2014-08-01 DIAGNOSIS — R748 Abnormal levels of other serum enzymes: Secondary | ICD-10-CM | POA: Diagnosis not present

## 2014-08-01 LAB — CMP (CANCER CENTER ONLY)
ALT(SGPT): 66 U/L — ABNORMAL HIGH (ref 10–47)
AST: 115 U/L — ABNORMAL HIGH (ref 11–38)
Albumin: 3.3 g/dL (ref 3.3–5.5)
Alkaline Phosphatase: 294 U/L — ABNORMAL HIGH (ref 26–84)
BUN, Bld: 23 mg/dL — ABNORMAL HIGH (ref 7–22)
CO2: 29 meq/L (ref 18–33)
CREATININE: 1.1 mg/dL (ref 0.6–1.2)
Calcium: 9.9 mg/dL (ref 8.0–10.3)
Chloride: 98 mEq/L (ref 98–108)
GLUCOSE: 198 mg/dL — AB (ref 73–118)
Potassium: 4.6 mEq/L (ref 3.3–4.7)
Sodium: 139 mEq/L (ref 128–145)
TOTAL PROTEIN: 7.6 g/dL (ref 6.4–8.1)
Total Bilirubin: 0.8 mg/dl (ref 0.20–1.60)

## 2014-08-01 LAB — CANCER ANTIGEN 19-9: CA 19-9: 192.6 U/mL — ABNORMAL HIGH (ref <35.0)

## 2014-08-01 LAB — CBC WITH DIFFERENTIAL (CANCER CENTER ONLY)
BASO#: 0 10*3/uL (ref 0.0–0.2)
BASO%: 0.3 % (ref 0.0–2.0)
EOS ABS: 0.1 10*3/uL (ref 0.0–0.5)
EOS%: 2 % (ref 0.0–7.0)
HEMATOCRIT: 34.2 % — AB (ref 38.7–49.9)
HEMOGLOBIN: 11.3 g/dL — AB (ref 13.0–17.1)
LYMPH#: 1.4 10*3/uL (ref 0.9–3.3)
LYMPH%: 22 % (ref 14.0–48.0)
MCH: 34.3 pg — ABNORMAL HIGH (ref 28.0–33.4)
MCHC: 33 g/dL (ref 32.0–35.9)
MCV: 104 fL — ABNORMAL HIGH (ref 82–98)
MONO#: 0.5 10*3/uL (ref 0.1–0.9)
MONO%: 7.4 % (ref 0.0–13.0)
NEUT#: 4.4 10*3/uL (ref 1.5–6.5)
NEUT%: 68.3 % (ref 40.0–80.0)
Platelets: 181 10*3/uL (ref 145–400)
RBC: 3.29 10*6/uL — AB (ref 4.20–5.70)
RDW: 15.9 % — ABNORMAL HIGH (ref 11.1–15.7)
WBC: 6.4 10*3/uL (ref 4.0–10.0)

## 2014-08-01 LAB — LACTATE DEHYDROGENASE: LDH: 205 U/L (ref 94–250)

## 2014-08-01 LAB — PREALBUMIN: Prealbumin: 16.1 mg/dL — ABNORMAL LOW (ref 17.0–34.0)

## 2014-08-01 NOTE — Progress Notes (Signed)
[place]  [phone] [phone]) [phone]  ID: ARMANII PRESSNELL OB: [birth year] MR#: [id number] ZRA#:[id number] Patient Care Team: Doe-Hyun Kyra Searles, DO as PCP - General (Internal Medicine)  DIAGNOSIS: Stage IV pancreatic cancer with mets to liver  INTERVAL HISTORY: Mr. Brouillard is here today with a friend for a follow-up. He is feeling much better since being home from the hospital. He was admitted on 12/31 for sepsis and pneumonia. He was discharged on 1/4 and is finishing up his Amoxicillin. He started back on his Xeloda on Monday 1/11 and has had no side effects with it.  The swelling in his legs is gone. He is still wearing compression stockings. He takes Eliquis daily.  He denies fever, chills, n/v, cough, rash, headache, dizziness, SOB, chest pain, palpitations, diarrhea, blood in urine or stool. The lactulose continues to help with his constipation.  He's had no swelling, tenderness, numbness or tingling in his extremities.  His appetite is much better and he drinking more fluids. His weight is up to 169 lbs. His CA 19-9, in December, was 231.9.   CURRENT TREATMENT: Xeloda 1500 mg BID (14 days on 7 off)  Eliquis 2.5 mg BID  REVIEW OF SYSTEMS: All other 10 point review of systems is negative.   PAST MEDICAL HISTORY: Past Medical History  Diagnosis Date  . Diverticulosis of colon (without mention of hemorrhage)   . Irritable bowel syndrome   . Personal history of malignant neoplasm of prostate     s/p  radioactive seed implants 2000  . Unspecified hereditary and idiopathic peripheral neuropathy   . Nonischemic cardiomyopathy cardiologist-  dr Aundra Dubin    EF 25%  per last echo 2012 and cardiologist note--  2007-  ef  40-45%;  2008- ef 35-40%;  2010- ef 30-35%;  2012- ef 25%  . Sinus bradycardia   . Hypertension   . Arthritis     hands  . Hyperlipidemia   . History of DVT of lower extremity     2002  ,   left leg  . History of adenomatous polyp of colon      2001  . History of basal cell carcinoma excision   . Anticoagulated on Coumadin   . Type 2 diabetes, diet controlled   . GERD (gastroesophageal reflux disease)   . LBBB (left bundle branch block)   . Wears glasses   . Wears hearing aid     BILATERAL  . Unsteady gait   . DDD (degenerative disc disease)     cervical and lumbar  . DJD (degenerative joint disease)     hips  . Lower urinary tract symptoms (LUTS)   . 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 Coronary artery disease     per cath 2002  non-obstructive cad  . Chronic systolic heart failure   . H/O fracture of nose     12-30-2013-  pt fell at home open nose bone fx   PAST SURGICAL HISTORY: Past Surgical History  Procedure Laterality Date  . Knee arthroscopy Left 1991  . Ercp with sphincterotomy and stone removal  05/ 1993  &  05-11-2006  . Total knee arthroplasty  10/05/2011    Procedure: TOTAL KNEE ARTHROPLASTY;  Surgeon: Gearlean Alf, MD;  Location: [facility];  Service: Orthopedics;  Laterality: Right;  . Transurethral resection of bladder tumor N/A 09/30/2012    Procedure: TRANSURETHRAL RESECTION  OF BLADDER TUMOR (TURBT);  Surgeon: Franchot Gallo, MD;  Location: [facility];  Service: Urology;  Laterality: N/A;  WITH MITOMYCIN INSTILLATION   . Eus N/A 01/04/2014    Procedure: ESOPHAGEAL ENDOSCOPIC ULTRASOUND (EUS) RADIAL;  Surgeon: [name], MD;  Location: [facility];  Service: Endoscopy;  Laterality: N/A;  . Radioactive prostate seed implants  01/ 2000  . Mohs surgery  03/ 2009    LEFT EAR BASAL CELL CARCINOMA  . Tonsillectomy  AS CHILD  . Cholecystectomy open  1985  . Appendectomy  child  . Inguinal hernia repair Bilateral 1998    recurrent RIH  w/ repair 06-21-2006  . Total knee arthroplasty Left 02-21-2007    AND 04-21-2007  CLOSED MANIPULATION  . Cataract extraction w/ intraocular lens implant Right 2004  . Lumbar  spine surgery  X2  . Cardiac catheterization  06-14-2001  dr Darnell Level brodie    non-obstructive CAD/  LM 30%,  pLAD 50%,  mLAD 30%,  RCA  &  CFX  irregularites ,  global hypokinesis,  ef 40%  . Cardiovascular stress test  02-07-2007   dr Darnell Level brodie    negative for ischemia,  low risk myoview perfusion study  . Transthoracic echocardiogram  01-28-2011    moderate LVH/  ef 25%/  diffuse hypokinesis/  mild LAE/  mild TR/  trivial AR & MR  . Cystoscopy w/ retrogrades Bilateral 03/09/2014    Procedure: CYSTOSCOPY WITH BILATERAL RETROGRADE PYELOGRAM BLADDER BX AND RENAL WASHINGS.;  Surgeon: Jorja Loa, MD;  Location: [facility];  Service: Urology;  Laterality: Bilateral;  . Eus N/A 04/05/2014    Procedure: UPPER ENDOSCOPIC ULTRASOUND (EUS) LINEAR;  Surgeon: [name], MD;  Location: [facility];  Service: Endoscopy;  Laterality: N/A;  radial linear  . Endoscopic retrograde cholangiopancreatography (ercp) with propofol N/A 04/12/2014    Procedure: ENDOSCOPIC RETROGRADE CHOLANGIOPANCREATOGRAPHY (ERCP) WITH PROPOFOL;  Surgeon: [name], MD;  Location: [facility];  Service: Endoscopy;  Laterality: N/A;  metal stent  . Biliary stent placement N/A 04/12/2014    Procedure: BILIARY STENT PLACEMENT;  Surgeon: [name], MD;  Location: [facility];  Service: Endoscopy;  Laterality: N/A;   FAMILY HISTORY Family History  Problem Relation Age of Onset  . Throat cancer Brother   . Coronary artery disease Father     deceased   GYNECOLOGIC HISTORY:  No LMP for male patient.   SOCIAL HISTORY:  History   Social History  . Marital Status: Widowed    Spouse Name: N/A    Number of Children: 0  . Years of Education: N/A   Occupational History  .     Social History Main Topics  . Smoking status: Former Smoker -- 1.00 packs/day for 10 years    Types: Cigarettes    Start date: 04/10/1954    Quit date: 09/10/1982  . Smokeless tobacco: Never Used     Comment:  quit 32 years ago  . Alcohol Use: No  . Drug Use: No  . Sexual Activity: Not on file   Other Topics Concern  . Not on file   Social History Narrative   Married, no children.    ADVANCED DIRECTIVES: <no information>  HEALTH MAINTENANCE: History  Substance Use Topics  . Smoking status: Former Smoker -- 1.00 packs/day for 10 years    Types: Cigarettes    Start date: 04/10/1954    Quit date: 09/10/1982  . Smokeless tobacco: Never Used     Comment:  quit 32 years ago  . Alcohol Use: No   Colonoscopy: PAP: Bone density: Lipid panel:  No Known Allergies  Current Outpatient Prescriptions  Medication Sig Dispense Refill  . acetaminophen (TYLENOL) 325 MG tablet Take 650 mg by mouth once as needed for fever.    Marland Kitchen amoxicillin (AMOXIL) 500 MG capsule Take 1 capsule (500 mg total) by mouth 3 (three) times daily. For 2 weeks starting 07/29/14 30 capsule 0  . apixaban (ELIQUIS) 5 MG TABS tablet Take 1 tablet (5 mg total) by mouth 2 (two) times daily.    . bisacodyl (DULCOLAX) 10 MG suppository Place 1 suppository (10 mg total) rectally daily as needed for moderate constipation. 12 suppository 0  . capecitabine (XELODA) 500 MG tablet Take 3 tablets (1,500 mg total) by mouth 2 (two) times daily after a meal. TAKES 3 TABS (1500) MG BID  ///  ON 14 DAYS OFF 7 DAYS FOR 3 CYCLES.   STARTED 06/06/14    . carvedilol (COREG) 12.5 MG tablet Take 12.5 mg by mouth 2 (two) times daily with a meal.    . cefTRIAXone (ROCEPHIN) 20 MG/ML IVPB Inject 50 mLs (1 g total) into the vein daily. For 5 more days starting 07/24/14. 50 mL   . docusate sodium 100 MG CAPS Take 100 mg by mouth 2 (two) times daily. 60 capsule 0  . feeding supplement, GLUCERNA SHAKE, (GLUCERNA SHAKE) LIQD Take 237 mLs by mouth 2 (two) times daily between meals as needed (If pt with <50% meal completion).  0  . HYDROcodone-acetaminophen (NORCO) 10-325 MG per tablet Take one tablet by mouth every 4 hours as needed for pain 20 tablet 0  .  insulin glargine (LANTUS) 100 UNIT/ML injection Inject 3 Units into the skin at bedtime.    . lidocaine-prilocaine (EMLA) cream Apply 1 application topically as needed. (Patient taking differently: Apply 1 application topically as needed (port access). ) 30 g 0  . LORazepam (ATIVAN) 0.5 MG tablet Take 2 tablets (1 mg total) by mouth every 6 (six) hours as needed (nausea/vomiting). (Patient taking differently: Take 0.5-1 mg by mouth See admin instructions. ) 15 tablet 0  . LORazepam (ATIVAN) 0.5 MG tablet Take 1 tablet (0.5 mg total) by mouth at bedtime. 30 tablet 0  . Multiple Vitamin (MULTIVITAMIN WITH MINERALS) TABS tablet Take 1 tablet by mouth daily.    . nitroGLYCERIN (NITROSTAT) 0.4 MG SL tablet Place 1 tablet (0.4 mg total) under the tongue every 5 (five) minutes as needed for chest pain. 90 tablet 3  . Omega-3 Fatty Acids (FISH OIL) 1000 MG CAPS Take 1,000 mg by mouth every morning.     . ondansetron (ZOFRAN) 8 MG tablet Take by mouth 2 (two) times daily as needed for nausea or vomiting.     Marland Kitchen oxyCODONE (OXY IR/ROXICODONE) 5 MG immediate release tablet Take 1 tablet (5 mg total) by mouth 2 (two) times daily. 30 tablet 0  . polyethylene glycol (MIRALAX / GLYCOLAX) packet Take 17 g by mouth 2 (two) times daily as needed for mild constipation.    . polyvinyl alcohol (LIQUIFILM TEARS) 1.4 % ophthalmic solution Place 1 drop into both eyes 4 (four) times daily.    . pravastatin (PRAVACHOL) 40 MG tablet Take 40 mg by mouth every evening.     . pregabalin (LYRICA) 75 MG capsule Take 75 mg by mouth every morning.     . prochlorperazine (COMPAZINE) 5 MG tablet Take 5 mg by mouth every 6 (six) hours as needed  for nausea or vomiting.    . ranitidine (ZANTAC) 150 MG tablet Take 150 mg by mouth 2 (two) times daily.    . temazepam (RESTORIL) 15 MG capsule Take 15 mg by mouth at bedtime.     . [DISCONTINUED] Calcium Carbonate-Vitamin D (CALTRATE 600+D) 600-400 MG-UNIT per tablet Take 1 tablet by mouth daily.       No current facility-administered medications for this visit.   OBJECTIVE: There were no vitals filed for this visit. There is no weight on file to calculate BMI. ECOG FS:0 - Asymptomatic Ocular: Sclerae unicteric, pupils equal, round and reactive to light Ear-nose-throat: Oropharynx clear, dentition fair Lymphatic: No cervical or supraclavicular adenopathy Lungs no rales or rhonchi, good excursion bilaterally Heart regular rate and rhythm, no murmur appreciated Abd soft, nontender, positive bowel sounds MSK no focal spinal tenderness, no joint edema Neuro: non-focal, well-oriented, appropriate affect  LAB RESULTS: CMP     Component Value Date/Time   NA 133* 07/22/2014 0500   NA 137 07/11/2014 1152   NA 137 07/11/2014   K 3.8 07/22/2014 0500   K 4.2 07/11/2014 1152   CL 100 07/22/2014 0500   CL 96* 07/11/2014 1152   CO2 27 07/22/2014 0500   CO2 29 07/11/2014 1152   GLUCOSE 113* 07/22/2014 0500   GLUCOSE 179* 07/11/2014 1152   BUN 22 07/22/2014 0500   BUN 24* 07/11/2014 1152   BUN 24* 07/11/2014   CREATININE 1.00 07/22/2014 0500   CREATININE 1.0 07/11/2014 1152   CREATININE 1.0 07/11/2014   CALCIUM 8.9 07/22/2014 0500   CALCIUM 9.5 07/11/2014 1152   PROT 6.2 07/21/2014 0530   PROT 7.4 07/11/2014 1152   ALBUMIN 3.0* 07/21/2014 0530   AST 32 07/21/2014 0530   AST 23 07/11/2014 1152   ALT 30 07/21/2014 0530   ALT 26 07/11/2014 1152   ALKPHOS 194* 07/21/2014 0530   ALKPHOS 203* 07/11/2014 1152   BILITOT 0.9 07/21/2014 0530   BILITOT 0.70 07/11/2014 1152   GFRNONAA 64* 07/22/2014 0500   GFRAA 75* 07/22/2014 0500   No results found for: SPEP Lab Results  Component Value Date   WBC 4.6 07/22/2014   NEUTROABS 4.5 07/19/2014   HGB 10.5* 07/22/2014   HCT 31.3* 07/22/2014   MCV 101.3* 07/22/2014   PLT 190 07/22/2014   No results found for: LABCA2 No components found for: [id number] No results for input(s): INR in the last 168 hours.  STUDIES:  None  ASSESSMENT/PLAN: Mr. Noyce is a very pleasant 81 yo male with recently diagnosed stage IV pancreatic cancer with mets to the liver. He is currently on Xeloda 1500 mg BID 14 days on 7 days off for 3 cycles.  He is looking and feeling much better since being back at his [facility]. He is asymptomatic at this time.  CT scan on 1/1 showed stable liver lesions and little change to pancreatic mass.  His alk/phos and LFT's having increased. We will continue to watch these.  We will see him back in 3 weeks for follow-up and labs.  All questions were answered. He knows to call the clinic with any problems, questions or concerns. We can certainly see him much sooner if necessary.  Eliezer Bottom, NP 08/01/2014 1:38 PM

## 2014-08-06 ENCOUNTER — Other Ambulatory Visit: Payer: Self-pay | Admitting: Nurse Practitioner

## 2014-08-08 ENCOUNTER — Encounter: Payer: Self-pay | Admitting: Nurse Practitioner

## 2014-08-08 ENCOUNTER — Non-Acute Institutional Stay (SKILLED_NURSING_FACILITY): Payer: Medicare Other | Admitting: Nurse Practitioner

## 2014-08-08 DIAGNOSIS — E1122 Type 2 diabetes mellitus with diabetic chronic kidney disease: Secondary | ICD-10-CM

## 2014-08-08 DIAGNOSIS — I82402 Acute embolism and thrombosis of unspecified deep veins of left lower extremity: Secondary | ICD-10-CM

## 2014-08-08 DIAGNOSIS — K219 Gastro-esophageal reflux disease without esophagitis: Secondary | ICD-10-CM

## 2014-08-08 DIAGNOSIS — R1010 Upper abdominal pain, unspecified: Secondary | ICD-10-CM

## 2014-08-08 DIAGNOSIS — K5900 Constipation, unspecified: Secondary | ICD-10-CM

## 2014-08-08 DIAGNOSIS — N189 Chronic kidney disease, unspecified: Secondary | ICD-10-CM

## 2014-08-08 DIAGNOSIS — G4700 Insomnia, unspecified: Secondary | ICD-10-CM

## 2014-08-08 DIAGNOSIS — I5022 Chronic systolic (congestive) heart failure: Secondary | ICD-10-CM

## 2014-08-08 DIAGNOSIS — I10 Essential (primary) hypertension: Secondary | ICD-10-CM

## 2014-08-08 DIAGNOSIS — G609 Hereditary and idiopathic neuropathy, unspecified: Secondary | ICD-10-CM

## 2014-08-08 NOTE — Assessment & Plan Note (Signed)
12/20/13 Hgb A1c 7.3 04/09/14 CBG am 150-180, pm 186/235-adding Lantus 3 units qd qhs.  05/28/14 update Hgb A1c 7.1 06/27/14 continue Lantus 3 units daily.

## 2014-08-08 NOTE — Assessment & Plan Note (Signed)
Blood pressure controlled on Coreg 12.5mg  bid.

## 2014-08-08 NOTE — Assessment & Plan Note (Signed)
Compensated clinically.  

## 2014-08-08 NOTE — Assessment & Plan Note (Signed)
05/04/14 Doppler LLE: DVT at left femoral and popliteal veins not completely occlusive. anticoagulate with Eliquis 2.5mg  bid.

## 2014-08-08 NOTE — Assessment & Plan Note (Signed)
Takes ranitidine 150mg  bid.

## 2014-08-08 NOTE — Assessment & Plan Note (Signed)
Adequately managed with MiraLax bid prn, Enulose 15mg  bid prn, Colace 100mg  bid, Bisacodyl 10mg  suppository qd prn.  08/08/14 loose stools-negative for C-diff Toxin, hold Laxative and may use Imodium prn. Observe.

## 2014-08-08 NOTE — Assessment & Plan Note (Signed)
Takes Pregabalin 75mg  daily. Ambulates with walker on unit

## 2014-08-08 NOTE — Progress Notes (Signed)
Patient ID: [name] [name], male   DOB: Jan 16, 1923, 81 y.o.   MRN: [id number]   Code Status: Full code  No Known Allergies  Chief Complaint  Patient presents with  . Medical Management of Chronic Issues  . Acute Visit    loose stools, insomnia.     HPI: Patient is a 81 y.o. male seen in the SNF at [facility] today for evaluation of loose stools and chronic medical conditions.    Hospitalized 07/19/2014-07/23/2014 for Type 2 diabetes mellitus HYPERTENSION, BENIGN Pancreatic cancer Fever. He was recently hospitalized for similar issues with fever and was discharged on December 18, on Levaquin. He lives in a skilled nursing facility. He presented with a fever of 103F. He has a history of metastatic pancreatic cancer.   Hospitalized 07/02/14-07/06/14 for fever 104-resolved once ABT started. Negative urine culture, CXR, and blood culture.     Hospitalized 03/29/2014 -04/04/2014 for pancreatic mass, FTT, CHF. He was admitted 03/29/14 with chief complaint of malaise, decreased appetite, generalized weakness and hematuria. A CT scan done on admission showed a 4.9 cm enhancing lesion adjacent to the pancreatic head/uncinate process worrisome for a primary pancreatic adenocarcinoma and suspected multifocal hepatic metastasis. This mass was noted back in June when his urologist ordered a CT scan of his abdomen/pelvis, and he was referred to GI as an outpatient for further evaluation. An EUS with biopsy was planned however the patient did not hold his Coumadin as instructed and the biopsy was canceled. The mass was not detectable on the ultrasound. Further evaluation was subsequently halted given the patient's overall functional status and comorbidities. His wife died 3 weeks ago.    Problem List Items Addressed This Visit    Type 2 diabetes mellitus    12/20/13 Hgb A1c 7.3 04/09/14 CBG am 150-180, pm 186/235-adding Lantus 3 units qd qhs.  05/28/14 update Hgb A1c 7.1 06/27/14 continue Lantus 3 units  daily.       SYSTOLIC HEART FAILURE, CHRONIC    Compensated clinically.        Insomnia    Takes Temazepam 15mg  qhs and Lorazepam 0.5mg  qhs for sleep. The patient stated still cannot sleep at night. Will add Mirtazapine 7.5mg  po qhs. Observe.         HYPERTENSION, BENIGN    Blood pressure controlled on Coreg 12.5mg  bid.         Hereditary and idiopathic peripheral neuropathy    Takes Pregabalin 75mg  daily. Ambulates with walker on unit         GERD    Takes ranitidine 150mg  bid.         Constipation - Primary    Adequately managed with MiraLax bid prn, Enulose 15mg  bid prn, Colace 100mg  bid, Bisacodyl 10mg  suppository qd prn.  08/08/14 loose stools-negative for C-diff Toxin, hold Laxative and may use Imodium prn. Observe.         Acute thromboembolism of deep veins of lower extremity    05/04/14 Doppler LLE: DVT at left femoral and popliteal veins not completely occlusive. anticoagulate with Eliquis 2.5mg  bid.         Abdominal pain    Due to pancreatic cancer. Takes Oxycodone bid for pain control.           Review of Systems:  Review of Systems  Constitutional: Negative for fever, chills, weight loss, malaise/fatigue and diaphoresis.       Generalized weakness  HENT: Positive for hearing loss. Negative for congestion, ear discharge, ear pain, nosebleeds, sore  throat and tinnitus.   Eyes: Negative for blurred vision, double vision, photophobia, pain, discharge and redness.  Respiratory: Negative for cough, hemoptysis, sputum production, shortness of breath, wheezing and stridor.   Cardiovascular: Positive for leg swelling. Negative for chest pain, palpitations, orthopnea, claudication and PND.       Trace  Gastrointestinal: Positive for abdominal pain and diarrhea. Negative for heartburn, nausea, vomiting, constipation, blood in stool and melena.  Genitourinary: Positive for frequency. Negative for dysuria, urgency, hematuria and flank pain.    Musculoskeletal: Negative for myalgias, back pain, joint pain, falls and neck pain.       Ambulates with walker  Skin: Negative for rash.  Neurological: Positive for weakness. Negative for dizziness, tingling, tremors, sensory change, speech change, focal weakness, seizures, loss of consciousness and headaches.  Endo/Heme/Allergies: Negative for environmental allergies and polydipsia. Does not bruise/bleed easily.  Psychiatric/Behavioral: Positive for memory loss. Negative for depression, suicidal ideas, hallucinations and substance abuse. The patient is not nervous/anxious and does not have insomnia.      Past Medical History  Diagnosis Date  . Diverticulosis of colon (without mention of hemorrhage)   . Irritable bowel syndrome   . Personal history of malignant neoplasm of prostate     s/p  radioactive seed implants 2000  . Unspecified hereditary and idiopathic peripheral neuropathy   . Nonischemic cardiomyopathy cardiologist-  dr Aundra Dubin    EF 25%  per last echo 2012 and cardiologist note--  2007-  ef  40-45%;  2008- ef 35-40%;  2010- ef 30-35%;  2012- ef 25%  . Sinus bradycardia   . Hypertension   . Arthritis     hands  . Hyperlipidemia   . History of DVT of lower extremity     2002  ,   left leg  . History of adenomatous polyp of colon     2001  . History of basal cell carcinoma excision   . Anticoagulated on Coumadin   . Type 2 diabetes, diet controlled   . GERD (gastroesophageal reflux disease)   . LBBB (left bundle branch block)   . Wears glasses   . Wears hearing aid     BILATERAL  . Unsteady gait   . DDD (degenerative disc disease)     cervical and lumbar  . DJD (degenerative joint disease)     hips  . Lower urinary tract symptoms (LUTS)   . 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 Coronary artery disease     per cath 2002  non-obstructive cad  . Chronic systolic heart  failure   . H/O fracture of nose     12-30-2013-  pt fell at home open nose bone fx   Past Surgical History  Procedure Laterality Date  . Knee arthroscopy Left 1991  . Ercp with sphincterotomy and stone removal  05/ 1993  &  05-11-2006  . Total knee arthroplasty  10/05/2011    Procedure: TOTAL KNEE ARTHROPLASTY;  Surgeon: Gearlean Alf, MD;  Location: [facility];  Service: Orthopedics;  Laterality: Right;  . Transurethral resection of bladder tumor N/A 09/30/2012    Procedure: TRANSURETHRAL RESECTION OF BLADDER TUMOR (TURBT);  Surgeon: Franchot Gallo, MD;  Location: [facility];  Service: Urology;  Laterality: N/A;  WITH MITOMYCIN INSTILLATION   . Eus N/A 01/04/2014    Procedure: ESOPHAGEAL ENDOSCOPIC ULTRASOUND (EUS) RADIAL;  Surgeon: [name], MD;  Location: [facility];  Service: Endoscopy;  Laterality: N/A;  . Radioactive prostate seed implants  01/ 2000  . Mohs surgery  03/ 2009    LEFT EAR BASAL CELL CARCINOMA  . Tonsillectomy  AS CHILD  . Cholecystectomy open  1985  . Appendectomy  child  . Inguinal hernia repair Bilateral 1998    recurrent RIH  w/ repair 06-21-2006  . Total knee arthroplasty Left 02-21-2007    AND 04-21-2007  CLOSED MANIPULATION  . Cataract extraction w/ intraocular lens implant Right 2004  . Lumbar spine surgery  X2  . Cardiac catheterization  06-14-2001  dr Darnell Level brodie    non-obstructive CAD/  LM 30%,  pLAD 50%,  mLAD 30%,  RCA  &  CFX  irregularites ,  global hypokinesis,  ef 40%  . Cardiovascular stress test  02-07-2007   dr Darnell Level brodie    negative for ischemia,  low risk myoview perfusion study  . Transthoracic echocardiogram  01-28-2011    moderate LVH/  ef 25%/  diffuse hypokinesis/  mild LAE/  mild TR/  trivial AR & MR  . Cystoscopy w/ retrogrades Bilateral 03/09/2014    Procedure: CYSTOSCOPY WITH BILATERAL RETROGRADE PYELOGRAM BLADDER BX AND RENAL WASHINGS.;  Surgeon: Jorja Loa, MD;  Location: [facility];  Service:  Urology;  Laterality: Bilateral;  . Eus N/A 04/05/2014    Procedure: UPPER ENDOSCOPIC ULTRASOUND (EUS) LINEAR;  Surgeon: [name], MD;  Location: [facility];  Service: Endoscopy;  Laterality: N/A;  radial linear  . Endoscopic retrograde cholangiopancreatography (ercp) with propofol N/A 04/12/2014    Procedure: ENDOSCOPIC RETROGRADE CHOLANGIOPANCREATOGRAPHY (ERCP) WITH PROPOFOL;  Surgeon: [name], MD;  Location: [facility];  Service: Endoscopy;  Laterality: N/A;  metal stent  . Biliary stent placement N/A 04/12/2014    Procedure: BILIARY STENT PLACEMENT;  Surgeon: [name], MD;  Location: [facility];  Service: Endoscopy;  Laterality: N/A;   Social History:   reports that he quit smoking about 31 years ago. His smoking use included Cigarettes. He started smoking about 60 years ago. He has a 10 pack-year smoking history. He has never used smokeless tobacco. He reports that he does not drink alcohol or use illicit drugs.  Family History  Problem Relation Age of Onset  . Throat cancer Brother   . Coronary artery disease Father     deceased    Medications: Patient's Medications  New Prescriptions   No medications on file  Previous Medications   AMOXICILLIN (AMOXIL) 500 MG CAPSULE    Take 1 capsule (500 mg total) by mouth 3 (three) times daily. For 2 weeks starting 07/29/14   APIXABAN (ELIQUIS) 5 MG TABS TABLET    Take 1 tablet (5 mg total) by mouth 2 (two) times daily.   BISACODYL (DULCOLAX) 10 MG SUPPOSITORY    Place 1 suppository (10 mg total) rectally daily as needed for moderate constipation.   CAPECITABINE (XELODA) 500 MG TABLET    Take 3 tablets (1,500 mg total) by mouth 2 (two) times daily after a meal. TAKES 3 TABS (1500) MG BID  ///  ON 14 DAYS OFF 7 DAYS FOR 3 CYCLES.   STARTED 06/06/14   CARVEDILOL (COREG) 12.5 MG TABLET    Take 12.5 mg by mouth 2 (two) times daily with a meal.   DOCUSATE SODIUM 100 MG CAPS    Take 100 mg by mouth 2 (two) times daily.    FEEDING SUPPLEMENT, GLUCERNA SHAKE, ([place]) LIQD    Take  237 mLs by mouth 2 (two) times daily between meals as needed (If pt with <50% meal completion).   HYDROCODONE-ACETAMINOPHEN (NORCO) 10-325 MG PER TABLET    Take one tablet by mouth every 4 hours as needed for pain   INSULIN GLARGINE (LANTUS) 100 UNIT/ML INJECTION    Inject 3 Units into the skin at bedtime.   LACTULOSE (CHRONULAC) 10 GM/15ML SOLUTION    Take by mouth daily as needed for mild constipation.   LIDOCAINE-PRILOCAINE (EMLA) CREAM    Apply 1 application topically as needed.   LORAZEPAM (ATIVAN) 0.5 MG TABLET    Take 2 tablets (1 mg total) by mouth every 6 (six) hours as needed (nausea/vomiting).   LORAZEPAM (ATIVAN) 0.5 MG TABLET    Take 1 tablet (0.5 mg total) by mouth at bedtime.   MULTIPLE VITAMIN (MULTIVITAMIN WITH MINERALS) TABS TABLET    Take 1 tablet by mouth daily.   NITROGLYCERIN (NITROSTAT) 0.4 MG SL TABLET    Place 1 tablet (0.4 mg total) under the tongue every 5 (five) minutes as needed for chest pain.   OMEGA-3 FATTY ACIDS (FISH OIL) 1000 MG CAPS    Take 1,000 mg by mouth every morning.    ONDANSETRON (ZOFRAN) 8 MG TABLET    Take by mouth 2 (two) times daily as needed for nausea or vomiting.    OXYCODONE (OXY IR/ROXICODONE) 5 MG IMMEDIATE RELEASE TABLET    Take 1 tablet (5 mg total) by mouth 2 (two) times daily.   POLYETHYLENE GLYCOL (MIRALAX / GLYCOLAX) PACKET    Take 17 g by mouth 2 (two) times daily as needed for mild constipation.   POLYVINYL ALCOHOL (LIQUIFILM TEARS) 1.4 % OPHTHALMIC SOLUTION    Place 1 drop into both eyes 4 (four) times daily.   PRAVASTATIN (PRAVACHOL) 40 MG TABLET    Take 40 mg by mouth every evening.    PREGABALIN (LYRICA) 75 MG CAPSULE    Take 75 mg by mouth every morning.    PROCHLORPERAZINE (COMPAZINE) 5 MG TABLET    Take 5 mg by mouth every 6 (six) hours as needed for nausea or vomiting.   RANITIDINE (ZANTAC) 150 MG TABLET    Take 150 mg by mouth 2 (two) times daily.   SACCHAROMYCES  BOULARDII (FLORASTOR) 250 MG CAPSULE    Take 250 mg by mouth 2 (two) times daily.   TEMAZEPAM (RESTORIL) 15 MG CAPSULE    Take 15 mg by mouth at bedtime.   Modified Medications   No medications on file  Discontinued Medications   No medications on file     Physical Exam: Physical Exam  Constitutional: He is oriented to person, place, and time. He appears well-developed and well-nourished.  HENT:  Head: Normocephalic and atraumatic.  Mouth/Throat: Oropharynx is clear and moist.  Eyes: Conjunctivae and EOM are normal. Pupils are equal, round, and reactive to light. Right eye exhibits no discharge. Left eye exhibits no discharge. No scleral icterus.  Neck: Normal range of motion. Neck supple. No JVD present. No tracheal deviation present. No thyromegaly present.  Cardiovascular: Normal rate, regular rhythm and normal heart sounds.   Pulmonary/Chest: Effort normal and breath sounds normal. No stridor. He has no wheezes. He has no rales. He exhibits no tenderness.  Abdominal: Soft. Bowel sounds are normal. He exhibits no distension. There is no tenderness. There is no rebound and no guarding.  Minimal epigastric tenderness.  No ascites  Musculoskeletal: Normal range of motion. He exhibits edema. He exhibits no tenderness.  LLE  Lymphadenopathy:    He has no cervical adenopathy.  Neurological: He is alert and oriented to person, place, and time. No cranial nerve deficit.  Skin: Skin is warm and dry. No rash noted. No erythema. No pallor.  Intergluteal cleft redness.   Psychiatric: He has a normal mood and affect. His behavior is normal.    Filed Vitals:   08/08/14 1235  BP: 122/64  Pulse: 72  Temp: 97.8 F (36.6 C)  TempSrc: Tympanic  Resp: 14      Labs reviewed: Basic Metabolic Panel:  Recent Labs  05/29/14  07/20/14 0500 07/21/14 0530 07/22/14 0500 08/01/14 1317  NA 136*  < > 137 137 133* 139  K 4.3  < > 4.0 4.6 3.8 4.6  CL  --   < > 103 103 100 98  CO2  --   < > 27  28 27 29   GLUCOSE  --   < > 122* 132* 113* 198*  BUN 22*  < > 21 21 22  23*  CREATININE 1.0  < > 0.99 1.06 1.00 1.1  CALCIUM  --   < > 8.8 7.5* 8.9 9.9  TSH 1.15  --  0.846  --   --   --   < > = values in this interval not displayed. Liver Function Tests:  Recent Labs  07/19/14 2023 07/20/14 0500 07/21/14 0530 08/01/14 1317  AST 76* 57* 32 115*  ALT 48 40 30 66*  ALKPHOS 278* 240* 194* 294*  BILITOT 0.6 0.9 0.9 0.80  PROT 6.7 6.1 6.2 7.6  ALBUMIN 3.4* 3.1* 3.0*  --     Recent Labs  12/19/13 1514 03/29/14 1610 07/02/14 1845  LIPASE 8.0* 10* 7*   No results for input(s): AMMONIA in the last 8760 hours. CBC:  Recent Labs  07/11/14 1152 07/19/14 2023  07/21/14 0530 07/22/14 0500 08/01/14 1317  WBC 5.7 5.7  < > 4.0 4.6 6.4  NEUTROABS 3.8 4.5  --   --   --  4.4  HGB 11.3* 10.0*  < > 10.3* 10.5* 11.3*  HCT 33.9* 29.9*  < > 30.6* 31.3* 34.2*  MCV 102* 100.7*  < > 101.7* 101.3* 104*  PLT 208 168  < > 179 190 181  < > = values in this interval not displayed. Lipid Panel: No results for input(s): CHOL, HDL, LDLCALC, TRIG, CHOLHDL, LDLDIRECT in the last 8760 hours.  Past Procedures:  03/29/14 CT abd and pelvis w CM:  IMPRESSION:  4.9 cm enhancing lesion adjacent to the pancreatic head/uncinate  process, worrisome for primary pancreatic adenocarcinoma,  progressed.  Tumor likely involves the SMA/SMV and undersurface of the portal  vein. 9 mm short axis node in the porta hepatitis.  Suspected multifocal hepatic metastases, including a 3.2 cm lesion  in the right hepatic dome.   03/30/14 MR w wo CM abd:   IMPRESSION:  1. Subtle amorphous mass in the pancreatic head and proximal  pancreatic body currently measuring 4.4 x 3.2 cm, causing distal  common bile duct obstruction and demonstrating vascular involvement  (SMA, SMV, splenic vein, splenic portal confluence and proximal  portal vein), as above, highly concerning for primary pancreatic  neoplasm.  2. Although  there do appear to be multiple ill-defined hepatic  lesions, concerning for metastatic disease, these were poorly  evaluated on today's examination secondary to patient respiratory  motion. Continued attention on any future followup studies is  recommended, as metastatic disease to the liver is suspected.  3. Severe intra  and extrahepatic biliary ductal dilatation.   05/04/14 Doppler LLE: DVT at left femoral and popliteal veins not completely occlusive. Will anticoagulate with Eliquis 2.5mg  bid.    07/20/14 CT abd and pelvis:    IMPRESSION: 1. New tiny pleural effusions with adjacent atelectasis/ infiltrate. 2. Stable poorly marginated hepatic lesions suggesting metastatic disease. 3. Little change in pancreatic mass with patent metallic biliary stent and pneumobilia.   Assessment/Plan Type 2 diabetes mellitus 12/20/13 Hgb A1c 7.3 04/09/14 CBG am 150-180, pm 186/235-adding Lantus 3 units qd qhs.  05/28/14 update Hgb A1c 7.1 06/27/14 continue Lantus 3 units daily.    SYSTOLIC HEART FAILURE, CHRONIC Compensated clinically.     Insomnia Takes Temazepam 15mg  qhs and Lorazepam 0.5mg  qhs for sleep. The patient stated still cannot sleep at night. Will add Mirtazapine 7.5mg  po qhs. Observe.      HYPERTENSION, BENIGN Blood pressure controlled on Coreg 12.5mg  bid.      Hereditary and idiopathic peripheral neuropathy Takes Pregabalin 75mg  daily. Ambulates with walker on unit      GERD Takes ranitidine 150mg  bid.      Constipation Adequately managed with MiraLax bid prn, Enulose 15mg  bid prn, Colace 100mg  bid, Bisacodyl 10mg  suppository qd prn.  08/08/14 loose stools-negative for C-diff Toxin, hold Laxative and may use Imodium prn. Observe.      Acute thromboembolism of deep veins of lower extremity 05/04/14 Doppler LLE: DVT at left femoral and popliteal veins not completely occlusive. anticoagulate with Eliquis 2.5mg  bid.      Abdominal pain Due to pancreatic  cancer. Takes Oxycodone bid for pain control.       Family/ Staff Communication: observe the patient.   Goals of Care: SNF  Labs/tests ordered: none

## 2014-08-08 NOTE — Assessment & Plan Note (Signed)
Due to pancreatic cancer. Takes Oxycodone bid for pain control.

## 2014-08-08 NOTE — Assessment & Plan Note (Addendum)
Takes Temazepam 15mg  qhs and Lorazepam 0.5mg  qhs for sleep. The patient stated still cannot sleep at night. Will add Mirtazapine 7.5mg  po qhs. Observe.

## 2014-08-15 ENCOUNTER — Encounter: Payer: Self-pay | Admitting: Nurse Practitioner

## 2014-08-15 DIAGNOSIS — L89152 Pressure ulcer of sacral region, stage 2: Secondary | ICD-10-CM | POA: Insufficient documentation

## 2014-08-15 DIAGNOSIS — L8992 Pressure ulcer of unspecified site, stage 2: Secondary | ICD-10-CM

## 2014-08-15 HISTORY — DX: Pressure ulcer of unspecified site, stage 2: L89.92

## 2014-08-17 ENCOUNTER — Encounter: Payer: Self-pay | Admitting: Nurse Practitioner

## 2014-08-17 NOTE — Progress Notes (Signed)
This encounter was created in error - please disregard.

## 2014-08-17 NOTE — Progress Notes (Deleted)
Patient ID: [name] [name], male   DOB: Oct 30, 1922, 81 y.o.   MRN: [id number]    Facility      Place of Service:      No Known Allergies  No chief complaint on file.   HPI:  ***  Medications: Patient's Medications  New Prescriptions   No medications on file  Previous Medications   AMOXICILLIN (AMOXIL) 500 MG CAPSULE    Take 1 capsule (500 mg total) by mouth 3 (three) times daily. For 2 weeks starting 07/29/14   APIXABAN (ELIQUIS) 5 MG TABS TABLET    Take 1 tablet (5 mg total) by mouth 2 (two) times daily.   BISACODYL (DULCOLAX) 10 MG SUPPOSITORY    Place 1 suppository (10 mg total) rectally daily as needed for moderate constipation.   CAPECITABINE (XELODA) 500 MG TABLET    Take 3 tablets (1,500 mg total) by mouth 2 (two) times daily after a meal. TAKES 3 TABS (1500) MG BID  ///  ON 14 DAYS OFF 7 DAYS FOR 3 CYCLES.   STARTED 06/06/14   CARVEDILOL (COREG) 12.5 MG TABLET    Take 12.5 mg by mouth 2 (two) times daily with a meal.   DOCUSATE SODIUM 100 MG CAPS    Take 100 mg by mouth 2 (two) times daily.   FEEDING SUPPLEMENT, GLUCERNA SHAKE, (GLUCERNA SHAKE) LIQD    Take 237 mLs by mouth 2 (two) times daily between meals as needed (If pt with <50% meal completion).   HYDROCODONE-ACETAMINOPHEN (NORCO) 10-325 MG PER TABLET    Take one tablet by mouth every 4 hours as needed for pain   INSULIN GLARGINE (LANTUS) 100 UNIT/ML INJECTION    Inject 3 Units into the skin at bedtime.   LACTULOSE (CHRONULAC) 10 GM/15ML SOLUTION    Take by mouth daily as needed for mild constipation.   LIDOCAINE-PRILOCAINE (EMLA) CREAM    Apply 1 application topically as needed.   LORAZEPAM (ATIVAN) 0.5 MG TABLET    Take 2 tablets (1 mg total) by mouth every 6 (six) hours as needed (nausea/vomiting).   LORAZEPAM (ATIVAN) 0.5 MG TABLET    Take 1 tablet (0.5 mg total) by mouth at bedtime.   MULTIPLE VITAMIN (MULTIVITAMIN WITH MINERALS) TABS TABLET    Take 1 tablet by mouth daily.   NITROGLYCERIN (NITROSTAT) 0.4 MG SL  TABLET    Place 1 tablet (0.4 mg total) under the tongue every 5 (five) minutes as needed for chest pain.   OMEGA-3 FATTY ACIDS (FISH OIL) 1000 MG CAPS    Take 1,000 mg by mouth every morning.    ONDANSETRON (ZOFRAN) 8 MG TABLET    Take by mouth 2 (two) times daily as needed for nausea or vomiting.    OXYCODONE (OXY IR/ROXICODONE) 5 MG IMMEDIATE RELEASE TABLET    Take 1 tablet (5 mg total) by mouth 2 (two) times daily.   POLYETHYLENE GLYCOL (MIRALAX / GLYCOLAX) PACKET    Take 17 g by mouth 2 (two) times daily as needed for mild constipation.   POLYVINYL ALCOHOL (LIQUIFILM TEARS) 1.4 % OPHTHALMIC SOLUTION    Place 1 drop into both eyes 4 (four) times daily.   PRAVASTATIN (PRAVACHOL) 40 MG TABLET    Take 40 mg by mouth every evening.    PREGABALIN (LYRICA) 75 MG CAPSULE    Take 75 mg by mouth every morning.    PROCHLORPERAZINE (COMPAZINE) 5 MG TABLET    Take 5 mg by mouth every 6 (six) hours as needed for nausea  or vomiting.   RANITIDINE (ZANTAC) 150 MG TABLET    Take 150 mg by mouth 2 (two) times daily.   SACCHAROMYCES BOULARDII (FLORASTOR) 250 MG CAPSULE    Take 250 mg by mouth 2 (two) times daily.   TEMAZEPAM (RESTORIL) 15 MG CAPSULE    Take 15 mg by mouth at bedtime.   Modified Medications   No medications on file  Discontinued Medications   No medications on file     Review of Systems  Constitutional: Positive for fever. Negative for chills and diaphoresis.       About #15 Ibs in the past year. Generalized weakness.  HENT: Negative for congestion, ear discharge, ear pain, hearing loss, nosebleeds, sore throat and tinnitus.   Eyes: Negative for photophobia, pain, discharge and redness.  Respiratory: Negative for cough, shortness of breath, wheezing and stridor.   Cardiovascular: Positive for leg swelling. Negative for chest pain and palpitations.       New LLE  Gastrointestinal: Positive for abdominal pain. Negative for nausea, vomiting, diarrhea, constipation and blood in stool.        Epigastric area dull pain. He said it has been hurting for a long time  Endocrine: Negative for polydipsia.  Genitourinary: Positive for frequency. Negative for dysuria, urgency, hematuria and flank pain.  Musculoskeletal: Negative for myalgias, back pain and neck pain.  Skin: Negative for rash.       Port A Cath Intergluteal cleft redness.   Allergic/Immunologic: Negative for environmental allergies.  Neurological: Positive for weakness. Negative for dizziness, tremors, seizures and headaches.  Hematological: Does not bruise/bleed easily.  Psychiatric/Behavioral: Negative for suicidal ideas and hallucinations. The patient is not nervous/anxious.     There were no vitals filed for this visit. There is no weight on file to calculate BMI.  Physical Exam   Labs reviewed: Appointment on 08/01/2014  Component Date Value Ref Range Status  . WBC 08/01/2014 6.4  4.0 - 10.0 10e3/uL Final  . RBC 08/01/2014 3.29* 4.20 - 5.70 10e6/uL Final  . HGB 08/01/2014 11.3* 13.0 - 17.1 g/dL Final  . HCT 08/01/2014 34.2* 38.7 - 49.9 % Final  . MCV 08/01/2014 104* 82 - 98 fL Final  . MCH 08/01/2014 34.3* 28.0 - 33.4 pg Final  . MCHC 08/01/2014 33.0  32.0 - 35.9 g/dL Final  . RDW 08/01/2014 15.9* 11.1 - 15.7 % Final  . Platelets 08/01/2014 181  145 - 400 10e3/uL Final  . NEUT# 08/01/2014 4.4  1.5 - 6.5 10e3/uL Final  . LYMPH# 08/01/2014 1.4  0.9 - 3.3 10e3/uL Final  . MONO# 08/01/2014 0.5  0.1 - 0.9 10e3/uL Final  . Eosinophils Absolute 08/01/2014 0.1  0.0 - 0.5 10e3/uL Final  . BASO# 08/01/2014 0.0  0.0 - 0.2 10e3/uL Final  . NEUT% 08/01/2014 68.3  40.0 - 80.0 % Final  . LYMPH% 08/01/2014 22.0  14.0 - 48.0 % Final  . MONO% 08/01/2014 7.4  0.0 - 13.0 % Final  . EOS% 08/01/2014 2.0  0.0 - 7.0 % Final  . BASO% 08/01/2014 0.3  0.0 - 2.0 % Final  . CA 19-9 08/01/2014 192.6* <35.0 U/mL Final  . LDH 08/01/2014 205  94 - 250 U/L Final  . Prealbumin 08/01/2014 16.1* 17.0 - 34.0 mg/dL Final  . Sodium  08/01/2014 139  128 - 145 mEq/L Final  . Potassium 08/01/2014 4.6  3.3 - 4.7 mEq/L Final  . Chloride 08/01/2014 98  98 - 108 mEq/L Final  . CO2 08/01/2014 29  18 -  33 mEq/L Final  . Glucose, Bld 08/01/2014 198* 73 - 118 mg/dL Final  . BUN, Bld 08/01/2014 23* 7 - 22 mg/dL Final  . Creat 08/01/2014 1.1  0.6 - 1.2 mg/dl Final  . Total Bilirubin 08/01/2014 0.80  0.20 - 1.60 mg/dl Final  . Alkaline Phosphatase 08/01/2014 294* 26 - 84 U/L Final  . AST 08/01/2014 115* 11 - 38 U/L Final  . ALT(SGPT) 08/01/2014 66* 10 - 47 U/L Final  . Total Protein 08/01/2014 7.6  6.4 - 8.1 g/dL Final  . Albumin 08/01/2014 3.3  3.3 - 5.5 g/dL Final  . Calcium 08/01/2014 9.9  8.0 - 10.3 mg/dL Final  Admission on 07/19/2014, Discharged on 07/23/2014  Component Date Value Ref Range Status  . Lactic Acid, Venous 07/19/2014 2.11  0.5 - 2.2 mmol/L Final  . WBC 07/19/2014 5.7  4.0 - 10.5 K/uL Final  . RBC 07/19/2014 2.97* 4.22 - 5.81 MIL/uL Final  . Hemoglobin 07/19/2014 10.0* 13.0 - 17.0 g/dL Final  . HCT 07/19/2014 29.9* 39.0 - 52.0 % Final  . MCV 07/19/2014 100.7* 78.0 - 100.0 fL Final  . MCH 07/19/2014 33.7  26.0 - 34.0 pg Final  . MCHC 07/19/2014 33.4  30.0 - 36.0 g/dL Final  . RDW 07/19/2014 17.1* 11.5 - 15.5 % Final  . Platelets 07/19/2014 168  150 - 400 K/uL Final  . Neutrophils Relative % 07/19/2014 80* 43 - 77 % Final  . Neutro Abs 07/19/2014 4.5  1.7 - 7.7 K/uL Final  . Lymphocytes Relative 07/19/2014 11* 12 - 46 % Final  . Lymphs Abs 07/19/2014 0.6* 0.7 - 4.0 K/uL Final  . Monocytes Relative 07/19/2014 9  3 - 12 % Final  . Monocytes Absolute 07/19/2014 0.5  0.1 - 1.0 K/uL Final  . Eosinophils Relative 07/19/2014 0  0 - 5 % Final  . Eosinophils Absolute 07/19/2014 0.0  0.0 - 0.7 K/uL Final  . Basophils Relative 07/19/2014 0  0 - 1 % Final  . Basophils Absolute 07/19/2014 0.0  0.0 - 0.1 K/uL Final  . Sodium 07/19/2014 132* 135 - 145 mmol/L Final   Please note change in reference range.  . Potassium  07/19/2014 3.8  3.5 - 5.1 mmol/L Final   Please note change in reference range.  . Chloride 07/19/2014 98  96 - 112 mEq/L Final  . CO2 07/19/2014 26  19 - 32 mmol/L Final  . Glucose, Bld 07/19/2014 217* 70 - 99 mg/dL Final  . BUN 07/19/2014 24* 6 - 23 mg/dL Final  . Creatinine, Ser 07/19/2014 1.09  0.50 - 1.35 mg/dL Final  . Calcium 07/19/2014 8.8  8.4 - 10.5 mg/dL Final  . Total Protein 07/19/2014 6.7  6.0 - 8.3 g/dL Final  . Albumin 07/19/2014 3.4* 3.5 - 5.2 g/dL Final  . AST 07/19/2014 76* 0 - 37 U/L Final  . ALT 07/19/2014 48  0 - 53 U/L Final  . Alkaline Phosphatase 07/19/2014 278* 39 - 117 U/L Final  . Total Bilirubin 07/19/2014 0.6  0.3 - 1.2 mg/dL Final  . GFR calc non Af Amer 07/19/2014 58* >90 mL/min Final  . GFR calc Af Amer 07/19/2014 67* >90 mL/min Final   Comment: (NOTE) The eGFR has been calculated using the CKD EPI equation. This calculation has not been validated in all clinical situations. eGFR's persistently <90 mL/min signify possible Chronic Kidney Disease.   . Anion gap 07/19/2014 8  5 - 15 Final  . Color, Urine 07/19/2014 YELLOW  YELLOW Final  .  APPearance 07/19/2014 CLEAR  CLEAR Final  . Specific Gravity, Urine 07/19/2014 1.008  1.005 - 1.030 Final  . pH 07/19/2014 5.5  5.0 - 8.0 Final  . Glucose, UA 07/19/2014 NEGATIVE  NEGATIVE mg/dL Final  . Hgb urine dipstick 07/19/2014 SMALL* NEGATIVE Final  . Bilirubin Urine 07/19/2014 NEGATIVE  NEGATIVE Final  . Ketones, ur 07/19/2014 NEGATIVE  NEGATIVE mg/dL Final  . Protein, ur 07/19/2014 NEGATIVE  NEGATIVE mg/dL Final  . Urobilinogen, UA 07/19/2014 0.2  0.0 - 1.0 mg/dL Final  . Nitrite 07/19/2014 NEGATIVE  NEGATIVE Final  . Leukocytes, UA 07/19/2014 NEGATIVE  NEGATIVE Final  . RBC / HPF 07/19/2014 0-2  <3 RBC/hpf Final  . Specimen Description 07/19/2014 BLOOD R CHEST PORT   Final  . Special Requests 07/19/2014 BOTTLES DRAWN AEROBIC AND ANAEROBIC 5ML   Final  . Culture 07/19/2014    Final                    Value:VIRIDANS STREPTOCOCCUS Note: THE SIGNIFICANCE OF ISOLATING THIS ORGANISM FROM A SINGLE SET OF BLOOD CULTURES WHEN MULTIPLE SETS ARE DRAWN IS UNCERTAIN. PLEASE NOTIFY THE MICROBIOLOGY DEPARTMENT WITHIN ONE WEEK IF SPECIATION AND SENSITIVITIES ARE REQUIRED. Note: Gram Stain Report Called to,Read Back By and Verified With: CINDY HUGHEY 07/21/14 @ 2:14PM BY RUSCOE A. Performed at [facility]   . Report Status 07/19/2014 07/22/2014 FINAL   Final  . Specimen Description 07/20/2014 BLOOD LEFT ANTECUBITAL   Final  . Special Requests 07/20/2014 BOTTLES DRAWN AEROBIC AND ANAEROBIC 5ML   Final  . Culture 07/20/2014    Final                   Value:NO GROWTH 5 DAYS Performed at [facility]   . Report Status 07/20/2014 07/26/2014 FINAL   Final  . Specimen Description 07/19/2014 URINE, CATHETERIZED   Final  . Special Requests 07/19/2014 NONE   Final  . Colony Count 07/19/2014    Final                   Value:NO GROWTH Performed at [facility]   . Culture 07/19/2014    Final                   Value:NO GROWTH Performed at [facility]   . Report Status 07/19/2014 07/22/2014 FINAL   Final  . TSH 07/20/2014 0.846  0.350 - 4.500 uIU/mL Final   Performed at [facility]  . Hgb A1c MFr Bld 07/20/2014 6.8* <5.7 % Final   Comment: (NOTE)                                                                       According to the ADA Clinical Practice Recommendations for 2011, when HbA1c is used as a screening test:  >=6.5%   Diagnostic of Diabetes Mellitus           (if abnormal result is confirmed) 5.7-6.4%   Increased risk of developing Diabetes Mellitus References:Diagnosis and Classification of Diabetes Mellitus,Diabetes AJOI,7867,67(MCNOB 1):S62-S69 and Standards of Medical Care in         Diabetes - 2011,Diabetes SJGG,8366,29 (Suppl 1):S11-S61.   . Mean Plasma Glucose 07/20/2014 148* <117 mg/dL  Final   Performed at [facility]  . WBC 07/20/2014  5.0  4.0 - 10.5 K/uL Final  . RBC 07/20/2014 2.88* 4.22 - 5.81 MIL/uL Final  . Hemoglobin 07/20/2014 9.6* 13.0 - 17.0 g/dL Final  . HCT 07/20/2014 29.3* 39.0 - 52.0 % Final  . MCV 07/20/2014 101.7* 78.0 - 100.0 fL Final  . MCH 07/20/2014 33.3  26.0 - 34.0 pg Final  . MCHC 07/20/2014 32.8  30.0 - 36.0 g/dL Final  . RDW 07/20/2014 17.3* 11.5 - 15.5 % Final  . Platelets 07/20/2014 161  150 - 400 K/uL Final  . Sodium 07/20/2014 137  135 - 145 mmol/L Final   Please note change in reference range.  . Potassium 07/20/2014 4.0  3.5 - 5.1 mmol/L Final   Please note change in reference range.  . Chloride 07/20/2014 103  96 - 112 mEq/L Final  . CO2 07/20/2014 27  19 - 32 mmol/L Final  . Glucose, Bld 07/20/2014 122* 70 - 99 mg/dL Final  . BUN 07/20/2014 21  6 - 23 mg/dL Final  . Creatinine, Ser 07/20/2014 0.99  0.50 - 1.35 mg/dL Final  . Calcium 07/20/2014 8.8  8.4 - 10.5 mg/dL Final  . Total Protein 07/20/2014 6.1  6.0 - 8.3 g/dL Final  . Albumin 07/20/2014 3.1* 3.5 - 5.2 g/dL Final  . AST 07/20/2014 57* 0 - 37 U/L Final  . ALT 07/20/2014 40  0 - 53 U/L Final  . Alkaline Phosphatase 07/20/2014 240* 39 - 117 U/L Final  . Total Bilirubin 07/20/2014 0.9  0.3 - 1.2 mg/dL Final  . GFR calc non Af Amer 07/20/2014 70* >90 mL/min Final  . GFR calc Af Amer 07/20/2014 82* >90 mL/min Final   Comment: (NOTE) The eGFR has been calculated using the CKD EPI equation. This calculation has not been validated in all clinical situations. eGFR's persistently <90 mL/min signify possible Chronic Kidney Disease.   . Anion gap 07/20/2014 7  5 - 15 Final  . Glucose-Capillary 07/20/2014 128* 70 - 99 mg/dL Final  . Comment 1 07/20/2014 Notify RN   Final  . Glucose-Capillary 07/20/2014 131* 70 - 99 mg/dL Final  . Influenza A By PCR 07/20/2014 NEGATIVE  NEGATIVE Final  . Influenza B By PCR 07/20/2014 NEGATIVE  NEGATIVE Final  . H1N1 flu by pcr 07/20/2014 NOT DETECTED  NOT DETECTED Final   Comment:        The Xpert  Flu assay (FDA approved for nasal aspirates or washes and nasopharyngeal swab specimens), is intended as an aid in the diagnosis of influenza and should not be used as a sole basis for treatment. Performed at [facility]   . Glucose-Capillary 07/20/2014 130* 70 - 99 mg/dL Final  . WBC 07/21/2014 4.0  4.0 - 10.5 K/uL Final  . RBC 07/21/2014 3.01* 4.22 - 5.81 MIL/uL Final  . Hemoglobin 07/21/2014 10.3* 13.0 - 17.0 g/dL Final  . HCT 07/21/2014 30.6* 39.0 - 52.0 % Final  . MCV 07/21/2014 101.7* 78.0 - 100.0 fL Final  . MCH 07/21/2014 34.2* 26.0 - 34.0 pg Final  . MCHC 07/21/2014 33.7  30.0 - 36.0 g/dL Final  . RDW 07/21/2014 17.1* 11.5 - 15.5 % Final  . Platelets 07/21/2014 179  150 - 400 K/uL Final  . Sodium 07/21/2014 137  135 - 145 mmol/L Final   Please note change in reference range.  . Potassium 07/21/2014 4.6  3.5 - 5.1 mmol/L Final   Please note change in reference  range.  . Chloride 07/21/2014 103  96 - 112 mEq/L Final  . CO2 07/21/2014 28  19 - 32 mmol/L Final  . Glucose, Bld 07/21/2014 132* 70 - 99 mg/dL Final  . BUN 07/21/2014 21  6 - 23 mg/dL Final  . Creatinine, Ser 07/21/2014 1.06  0.50 - 1.35 mg/dL Final  . Calcium 07/21/2014 7.5* 8.4 - 10.5 mg/dL Final  . Total Protein 07/21/2014 6.2  6.0 - 8.3 g/dL Final  . Albumin 07/21/2014 3.0* 3.5 - 5.2 g/dL Final  . AST 07/21/2014 32  0 - 37 U/L Final  . ALT 07/21/2014 30  0 - 53 U/L Final  . Alkaline Phosphatase 07/21/2014 194* 39 - 117 U/L Final  . Total Bilirubin 07/21/2014 0.9  0.3 - 1.2 mg/dL Final  . GFR calc non Af Amer 07/21/2014 60* >90 mL/min Final  . GFR calc Af Amer 07/21/2014 70* >90 mL/min Final   Comment: (NOTE) The eGFR has been calculated using the CKD EPI equation. This calculation has not been validated in all clinical situations. eGFR's persistently <90 mL/min signify possible Chronic Kidney Disease.   . Anion gap 07/21/2014 6  5 - 15 Final  . Glucose-Capillary 07/20/2014 183* 70 - 99 mg/dL  Final  . Comment 1 07/20/2014 Notify RN   Final  . Glucose-Capillary 07/20/2014 142* 70 - 99 mg/dL Final  . Comment 1 07/20/2014 Notify RN   Final  . Glucose-Capillary 07/21/2014 185* 70 - 99 mg/dL Final  . Glucose-Capillary 07/21/2014 138* 70 - 99 mg/dL Final  . WBC 07/22/2014 4.6  4.0 - 10.5 K/uL Final  . RBC 07/22/2014 3.09* 4.22 - 5.81 MIL/uL Final  . Hemoglobin 07/22/2014 10.5* 13.0 - 17.0 g/dL Final  . HCT 07/22/2014 31.3* 39.0 - 52.0 % Final  . MCV 07/22/2014 101.3* 78.0 - 100.0 fL Final  . MCH 07/22/2014 34.0  26.0 - 34.0 pg Final  . MCHC 07/22/2014 33.5  30.0 - 36.0 g/dL Final  . RDW 07/22/2014 17.1* 11.5 - 15.5 % Final  . Platelets 07/22/2014 190  150 - 400 K/uL Final  . Sodium 07/22/2014 133* 135 - 145 mmol/L Final   Please note change in reference range.  . Potassium 07/22/2014 3.8  3.5 - 5.1 mmol/L Final   Comment: Please note change in reference range. DELTA CHECK NOTED   . Chloride 07/22/2014 100  96 - 112 mEq/L Final  . CO2 07/22/2014 27  19 - 32 mmol/L Final  . Glucose, Bld 07/22/2014 113* 70 - 99 mg/dL Final  . BUN 07/22/2014 22  6 - 23 mg/dL Final  . Creatinine, Ser 07/22/2014 1.00  0.50 - 1.35 mg/dL Final  . Calcium 07/22/2014 8.9  8.4 - 10.5 mg/dL Final  . GFR calc non Af Amer 07/22/2014 64* >90 mL/min Final  . GFR calc Af Amer 07/22/2014 75* >90 mL/min Final   Comment: (NOTE) The eGFR has been calculated using the CKD EPI equation. This calculation has not been validated in all clinical situations. eGFR's persistently <90 mL/min signify possible Chronic Kidney Disease.   . Anion gap 07/22/2014 6  5 - 15 Final  . Glucose-Capillary 07/21/2014 159* 70 - 99 mg/dL Final  . Vancomycin Tr 07/22/2014 15.1  10.0 - 20.0 ug/mL Final  . Glucose-Capillary 07/21/2014 160* 70 - 99 mg/dL Final  . Comment 1 07/21/2014 Notify RN   Final  . Glucose-Capillary 07/22/2014 115* 70 - 99 mg/dL Final  . Glucose-Capillary 07/22/2014 184* 70 - 99 mg/dL Final  . Edmonia Lynch  07/22/2014 130* 70 - 99 mg/dL Final  . Glucose-Capillary 07/22/2014 182* 70 - 99 mg/dL Final  . Comment 1 07/22/2014 Notify RN   Final  . Glucose-Capillary 07/23/2014 133* 70 - 99 mg/dL Final  . Glucose-Capillary 07/23/2014 177* 70 - 99 mg/dL Final  Lab on 07/16/2014  Component Date Value Ref Range Status  . Hemoglobin 07/11/2014 11.3* 13.5 - 17.5 g/dL Final  . HCT 07/11/2014 34* 41 - 53 % Final  . Platelets 07/11/2014 208  150 - 399 K/L Final  . WBC 07/11/2014 5.7   Final  . Glucose 07/11/2014 179   Final  . BUN 07/11/2014 24* 4 - 21 mg/dL Final  . Creatinine 07/11/2014 1.0  0.6 - 1.3 mg/dL Final  . Potassium 07/11/2014 4.2  3.4 - 5.3 mmol/L Final  . Sodium 07/11/2014 137  137 - 147 mmol/L Final  . Alkaline Phosphatase 07/11/2014 203* 25 - 125 U/L Final  . ALT 07/11/2014 26  10 - 40 U/L Final  . AST 07/11/2014 23  14 - 40 U/L Final  . Bilirubin, Total 07/11/2014 0.7   Final  Appointment on 07/11/2014  Component Date Value Ref Range Status  . WBC 07/11/2014 5.7  4.0 - 10.0 10e3/uL Final  . RBC 07/11/2014 3.33* 4.20 - 5.70 10e6/uL Final  . HGB 07/11/2014 11.3* 13.0 - 17.1 g/dL Final  . HCT 07/11/2014 33.9* 38.7 - 49.9 % Final  . MCV 07/11/2014 102* 82 - 98 fL Final  . MCH 07/11/2014 33.9* 28.0 - 33.4 pg Final  . MCHC 07/11/2014 33.3  32.0 - 35.9 g/dL Final  . RDW 07/11/2014 16.5* 11.1 - 15.7 % Final  . Platelets 07/11/2014 208  145 - 400 10e3/uL Final  . NEUT# 07/11/2014 3.8  1.5 - 6.5 10e3/uL Final  . LYMPH# 07/11/2014 1.4  0.9 - 3.3 10e3/uL Final  . MONO# 07/11/2014 0.4  0.1 - 0.9 10e3/uL Final  . Eosinophils Absolute 07/11/2014 0.1  0.0 - 0.5 10e3/uL Final  . BASO# 07/11/2014 0.0  0.0 - 0.2 10e3/uL Final  . NEUT% 07/11/2014 65.9  40.0 - 80.0 % Final  . LYMPH% 07/11/2014 25.3  14.0 - 48.0 % Final  . MONO% 07/11/2014 7.2  0.0 - 13.0 % Final  . EOS% 07/11/2014 1.1  0.0 - 7.0 % Final  . BASO% 07/11/2014 0.5  0.0 - 2.0 % Final  . Prealbumin 07/11/2014 17.7  17.0 - 34.0 mg/dL  Final  . Sodium 07/11/2014 137  128 - 145 mEq/L Final  . Potassium 07/11/2014 4.2  3.3 - 4.7 mEq/L Final  . Chloride 07/11/2014 96* 98 - 108 mEq/L Final  . CO2 07/11/2014 29  18 - 33 mEq/L Final  . Glucose, Bld 07/11/2014 179* 73 - 118 mg/dL Final  . BUN, Bld 07/11/2014 24* 7 - 22 mg/dL Final  . Creat 07/11/2014 1.0  0.6 - 1.2 mg/dl Final  . Total Bilirubin 07/11/2014 0.70  0.20 - 1.60 mg/dl Final  . Alkaline Phosphatase 07/11/2014 203* 26 - 84 U/L Final  . AST 07/11/2014 23  11 - 38 U/L Final  . ALT(SGPT) 07/11/2014 26  10 - 47 U/L Final  . Total Protein 07/11/2014 7.4  6.4 - 8.1 g/dL Final  . Albumin 07/11/2014 3.3  3.3 - 5.5 g/dL Final  . Calcium 07/11/2014 9.5  8.0 - 10.3 mg/dL Final  Admission on 07/02/2014, Discharged on 07/06/2014  Component Date Value Ref Range Status  . WBC 07/02/2014 4.9  4.0 - 10.5 K/uL Final  . RBC  07/02/2014 3.13* 4.22 - 5.81 MIL/uL Final  . Hemoglobin 07/02/2014 10.2* 13.0 - 17.0 g/dL Final  . HCT 07/02/2014 31.0* 39.0 - 52.0 % Final  . MCV 07/02/2014 99.0  78.0 - 100.0 fL Final  . MCH 07/02/2014 32.6  26.0 - 34.0 pg Final  . MCHC 07/02/2014 32.9  30.0 - 36.0 g/dL Final  . RDW 07/02/2014 17.7* 11.5 - 15.5 % Final  . Platelets 07/02/2014 154  150 - 400 K/uL Final  . Neutrophils Relative % 07/02/2014 83* 43 - 77 % Final  . Neutro Abs 07/02/2014 4.0  1.7 - 7.7 K/uL Final  . Lymphocytes Relative 07/02/2014 10* 12 - 46 % Final  . Lymphs Abs 07/02/2014 0.5* 0.7 - 4.0 K/uL Final  . Monocytes Relative 07/02/2014 7  3 - 12 % Final  . Monocytes Absolute 07/02/2014 0.3  0.1 - 1.0 K/uL Final  . Eosinophils Relative 07/02/2014 0  0 - 5 % Final  . Eosinophils Absolute 07/02/2014 0.0  0.0 - 0.7 K/uL Final  . Basophils Relative 07/02/2014 0  0 - 1 % Final  . Basophils Absolute 07/02/2014 0.0  0.0 - 0.1 K/uL Final  . Sodium 07/02/2014 134* 137 - 147 mEq/L Final  . Potassium 07/02/2014 4.7  3.7 - 5.3 mEq/L Final  . Chloride 07/02/2014 97  96 - 112 mEq/L Final  .  CO2 07/02/2014 23  19 - 32 mEq/L Final  . Glucose, Bld 07/02/2014 140* 70 - 99 mg/dL Final  . BUN 07/02/2014 23  6 - 23 mg/dL Final  . Creatinine, Ser 07/02/2014 0.90  0.50 - 1.35 mg/dL Final  . Calcium 07/02/2014 9.4  8.4 - 10.5 mg/dL Final  . Total Protein 07/02/2014 7.0  6.0 - 8.3 g/dL Final  . Albumin 07/02/2014 3.2* 3.5 - 5.2 g/dL Final  . AST 07/02/2014 187* 0 - 37 U/L Final  . ALT 07/02/2014 138* 0 - 53 U/L Final  . Alkaline Phosphatase 07/02/2014 413* 39 - 117 U/L Final  . Total Bilirubin 07/02/2014 0.7  0.3 - 1.2 mg/dL Final  . GFR calc non Af Amer 07/02/2014 73* >90 mL/min Final  . GFR calc Af Amer 07/02/2014 85* >90 mL/min Final   Comment: (NOTE) The eGFR has been calculated using the CKD EPI equation. This calculation has not been validated in all clinical situations. eGFR's persistently <90 mL/min signify possible Chronic Kidney Disease.   . Anion gap 07/02/2014 14  5 - 15 Final  . Specimen Description 07/02/2014 BLOOD BLOOD LEFT FOREARM   Final  . Special Requests 07/02/2014 BOTTLES DRAWN AEROBIC AND ANAEROBIC 5ML   Final  . Culture  Setup Time 07/02/2014    Final                   Value:07/02/2014 20:43 Performed at [facility]   . Culture 07/02/2014    Final                   Value:NO GROWTH 5 DAYS Performed at [facility]   . Report Status 07/02/2014 07/08/2014 FINAL   Final  . Specimen Description 07/02/2014 BLOOD BLOOD RIGHT FOREARM   Final  . Special Requests 07/02/2014 BOTTLES DRAWN AEROBIC AND ANAEROBIC 3ML   Final  . Culture  Setup Time 07/02/2014    Final                   Value:07/02/2014 20:43 Performed at [facility]   . Culture 07/02/2014  Final                   Value:NO GROWTH 5 DAYS Performed at [facility]   . Report Status 07/02/2014 07/08/2014 FINAL   Final  . Color, Urine 07/02/2014 AMBER* YELLOW Final   BIOCHEMICALS MAY BE AFFECTED BY COLOR  . APPearance 07/02/2014 CLEAR  CLEAR Final  . Specific  Gravity, Urine 07/02/2014 1.020  1.005 - 1.030 Final  . pH 07/02/2014 5.0  5.0 - 8.0 Final  . Glucose, UA 07/02/2014 NEGATIVE  NEGATIVE mg/dL Final  . Hgb urine dipstick 07/02/2014 MODERATE* NEGATIVE Final  . Bilirubin Urine 07/02/2014 NEGATIVE  NEGATIVE Final  . Ketones, ur 07/02/2014 NEGATIVE  NEGATIVE mg/dL Final  . Protein, ur 07/02/2014 NEGATIVE  NEGATIVE mg/dL Final  . Urobilinogen, UA 07/02/2014 1.0  0.0 - 1.0 mg/dL Final  . Nitrite 07/02/2014 NEGATIVE  NEGATIVE Final  . Leukocytes, UA 07/02/2014 NEGATIVE  NEGATIVE Final  . Specimen Description 07/02/2014 URINE, CLEAN CATCH   Final  . Special Requests 07/02/2014 NONE   Final  . Culture  Setup Time 07/02/2014    Final                   Value:07/02/2014 20:54 Performed at [facility]   . Colony Count 07/02/2014    Final                   Value:NO GROWTH Performed at [facility]   . Culture 07/02/2014    Final                   Value:NO GROWTH Performed at [facility]   . Report Status 07/02/2014 07/03/2014 FINAL   Final  . Lactic Acid, Venous 07/02/2014 1.66  0.5 - 2.2 mmol/L Final  . RBC / HPF 07/02/2014 3-6  <3 RBC/hpf Final  . Influenza A By PCR 07/02/2014 NEGATIVE  NEGATIVE Final  . Influenza B By PCR 07/02/2014 NEGATIVE  NEGATIVE Final  . H1N1 flu by pcr 07/02/2014 NOT DETECTED  NOT DETECTED Final   Comment:        The Xpert Flu assay (FDA approved for nasal aspirates or washes and nasopharyngeal swab specimens), is intended as an aid in the diagnosis of influenza and should not be used as a sole basis for treatment. Performed at [facility]   . Lipase 07/02/2014 7* 11 - 59 U/L Final  . Troponin I 07/02/2014 <0.30  <0.30 ng/mL Final   Comment:        Due to the release kinetics of cTnI, a negative result within the first hours of the onset of symptoms does not rule out myocardial infarction with certainty. If myocardial infarction is still suspected, repeat the test at  appropriate intervals.   . Sodium 07/03/2014 137  137 - 147 mEq/L Final  . Potassium 07/03/2014 4.1  3.7 - 5.3 mEq/L Final  . Chloride 07/03/2014 101  96 - 112 mEq/L Final  . CO2 07/03/2014 25  19 - 32 mEq/L Final  . Glucose, Bld 07/03/2014 126* 70 - 99 mg/dL Final  . BUN 07/03/2014 21  6 - 23 mg/dL Final  . Creatinine, Ser 07/03/2014 0.91  0.50 - 1.35 mg/dL Final  . Calcium 07/03/2014 9.2  8.4 - 10.5 mg/dL Final  . GFR calc non Af Amer 07/03/2014 73* >90 mL/min Final  . GFR calc Af Amer 07/03/2014 85* >90 mL/min Final   Comment: (NOTE) The eGFR has  been calculated using the CKD EPI equation. This calculation has not been validated in all clinical situations. eGFR's persistently <90 mL/min signify possible Chronic Kidney Disease.   . Anion gap 07/03/2014 11  5 - 15 Final  . WBC 07/03/2014 4.2  4.0 - 10.5 K/uL Final  . RBC 07/03/2014 2.97* 4.22 - 5.81 MIL/uL Final  . Hemoglobin 07/03/2014 9.6* 13.0 - 17.0 g/dL Final  . HCT 07/03/2014 29.5* 39.0 - 52.0 % Final  . MCV 07/03/2014 99.3  78.0 - 100.0 fL Final  . MCH 07/03/2014 32.3  26.0 - 34.0 pg Final  . MCHC 07/03/2014 32.5  30.0 - 36.0 g/dL Final  . RDW 07/03/2014 17.7* 11.5 - 15.5 % Final  . Platelets 07/03/2014 138* 150 - 400 K/uL Final  . MRSA by PCR 07/02/2014 NEGATIVE  NEGATIVE Final   Comment:        The GeneXpert MRSA Assay (FDA approved for NASAL specimens only), is one component of a comprehensive MRSA colonization surveillance program. It is not intended to diagnose MRSA infection nor to guide or monitor treatment for MRSA infections.   . WBC 07/04/2014 4.9  4.0 - 10.5 K/uL Final   WHITE COUNT CONFIRMED ON SMEAR  . RBC 07/04/2014 3.09* 4.22 - 5.81 MIL/uL Final  . Hemoglobin 07/04/2014 10.1* 13.0 - 17.0 g/dL Final  . HCT 07/04/2014 30.8* 39.0 - 52.0 % Final  . MCV 07/04/2014 99.7  78.0 - 100.0 fL Final  . MCH 07/04/2014 32.7  26.0 - 34.0 pg Final  . MCHC 07/04/2014 32.8  30.0 - 36.0 g/dL Final  . RDW 07/04/2014  17.5* 11.5 - 15.5 % Final  . Platelets 07/04/2014 168  150 - 400 K/uL Final  . Sodium 07/04/2014 138  137 - 147 mEq/L Final  . Potassium 07/04/2014 4.2  3.7 - 5.3 mEq/L Final  . Chloride 07/04/2014 101  96 - 112 mEq/L Final  . CO2 07/04/2014 28  19 - 32 mEq/L Final  . Glucose, Bld 07/04/2014 148* 70 - 99 mg/dL Final  . BUN 07/04/2014 19  6 - 23 mg/dL Final  . Creatinine, Ser 07/04/2014 0.97  0.50 - 1.35 mg/dL Final  . Calcium 07/04/2014 9.2  8.4 - 10.5 mg/dL Final  . GFR calc non Af Amer 07/04/2014 71* >90 mL/min Final  . GFR calc Af Amer 07/04/2014 82* >90 mL/min Final   Comment: (NOTE) The eGFR has been calculated using the CKD EPI equation. This calculation has not been validated in all clinical situations. eGFR's persistently <90 mL/min signify possible Chronic Kidney Disease.   . Anion gap 07/04/2014 9  5 - 15 Final  . WBC 07/05/2014 4.5  4.0 - 10.5 K/uL Final  . RBC 07/05/2014 3.18* 4.22 - 5.81 MIL/uL Final  . Hemoglobin 07/05/2014 10.5* 13.0 - 17.0 g/dL Final  . HCT 07/05/2014 32.2* 39.0 - 52.0 % Final  . MCV 07/05/2014 101.3* 78.0 - 100.0 fL Final  . MCH 07/05/2014 33.0  26.0 - 34.0 pg Final  . MCHC 07/05/2014 32.6  30.0 - 36.0 g/dL Final  . RDW 07/05/2014 17.3* 11.5 - 15.5 % Final  . Platelets 07/05/2014 203  150 - 400 K/uL Final  . Prealbumin 07/05/2014 12.1* 17.0 - 34.0 mg/dL Final   Performed at [facility]  . Sodium 07/05/2014 138  137 - 147 mEq/L Final  . Potassium 07/05/2014 4.0  3.7 - 5.3 mEq/L Final  . Chloride 07/05/2014 96  96 - 112 mEq/L Final  . CO2 07/05/2014 29  19 - 32 mEq/L Final  . Glucose, Bld 07/05/2014 137* 70 - 99 mg/dL Final  . BUN 07/05/2014 17  6 - 23 mg/dL Final  . Creatinine, Ser 07/05/2014 1.04  0.50 - 1.35 mg/dL Final  . Calcium 07/05/2014 9.7  8.4 - 10.5 mg/dL Final  . Total Protein 07/05/2014 6.9  6.0 - 8.3 g/dL Final  . Albumin 07/05/2014 3.0* 3.5 - 5.2 g/dL Final  . AST 07/05/2014 30  0 - 37 U/L Final  . ALT 07/05/2014 51  0 -  53 U/L Final  . Alkaline Phosphatase 07/05/2014 270* 39 - 117 U/L Final  . Total Bilirubin 07/05/2014 0.4  0.3 - 1.2 mg/dL Final  . GFR calc non Af Amer 07/05/2014 62* >90 mL/min Final  . GFR calc Af Amer 07/05/2014 71* >90 mL/min Final   Comment: (NOTE) The eGFR has been calculated using the CKD EPI equation. This calculation has not been validated in all clinical situations. eGFR's persistently <90 mL/min signify possible Chronic Kidney Disease.   . Anion gap 07/05/2014 13  5 - 15 Final  . WBC 07/06/2014 4.4  4.0 - 10.5 K/uL Final  . RBC 07/06/2014 3.17* 4.22 - 5.81 MIL/uL Final  . Hemoglobin 07/06/2014 10.1* 13.0 - 17.0 g/dL Final  . HCT 07/06/2014 31.6* 39.0 - 52.0 % Final  . MCV 07/06/2014 99.7  78.0 - 100.0 fL Final  . MCH 07/06/2014 31.9  26.0 - 34.0 pg Final  . MCHC 07/06/2014 32.0  30.0 - 36.0 g/dL Final  . RDW 07/06/2014 17.4* 11.5 - 15.5 % Final  . Platelets 07/06/2014 185  150 - 400 K/uL Final  . Sodium 07/06/2014 138  137 - 147 mEq/L Final  . Potassium 07/06/2014 4.2  3.7 - 5.3 mEq/L Final  . Chloride 07/06/2014 99  96 - 112 mEq/L Final  . CO2 07/06/2014 28  19 - 32 mEq/L Final  . Glucose, Bld 07/06/2014 138* 70 - 99 mg/dL Final  . BUN 07/06/2014 23  6 - 23 mg/dL Final  . Creatinine, Ser 07/06/2014 1.02  0.50 - 1.35 mg/dL Final  . Calcium 07/06/2014 9.7  8.4 - 10.5 mg/dL Final  . GFR calc non Af Amer 07/06/2014 63* >90 mL/min Final  . GFR calc Af Amer 07/06/2014 73* >90 mL/min Final   Comment: (NOTE) The eGFR has been calculated using the CKD EPI equation. This calculation has not been validated in all clinical situations. eGFR's persistently <90 mL/min signify possible Chronic Kidney Disease.   . Anion gap 07/06/2014 11  5 - 15 Final  Appointment on 06/20/2014  Component Date Value Ref Range Status  . WBC 06/20/2014 4.5  4.0 - 10.0 10e3/uL Final  . RBC 06/20/2014 3.33* 4.20 - 5.70 10e6/uL Final  . HGB 06/20/2014 10.8* 13.0 - 17.1 g/dL Final  . HCT 06/20/2014  33.2* 38.7 - 49.9 % Final  . MCV 06/20/2014 100* 82 - 98 fL Final  . MCH 06/20/2014 32.4  28.0 - 33.4 pg Final  . MCHC 06/20/2014 32.5  32.0 - 35.9 g/dL Final  . RDW 06/20/2014 17.4* 11.1 - 15.7 % Final  . Platelets 06/20/2014 214  145 - 400 10e3/uL Final  . NEUT# 06/20/2014 2.8  1.5 - 6.5 10e3/uL Final  . LYMPH# 06/20/2014 1.3  0.9 - 3.3 10e3/uL Final  . MONO# 06/20/2014 0.3  0.1 - 0.9 10e3/uL Final  . Eosinophils Absolute 06/20/2014 0.1  0.0 - 0.5 10e3/uL Final  . BASO# 06/20/2014 0.0  0.0 - 0.2 10e3/uL Final  .  NEUT% 06/20/2014 61.5  40.0 - 80.0 % Final  . LYMPH% 06/20/2014 27.8  14.0 - 48.0 % Final  . MONO% 06/20/2014 7.1  0.0 - 13.0 % Final  . EOS% 06/20/2014 2.9  0.0 - 7.0 % Final  . BASO% 06/20/2014 0.7  0.0 - 2.0 % Final  . CA 19-9 06/20/2014 231.9* <35.0 U/mL Final  . Prealbumin 06/20/2014 15.1* 17.0 - 34.0 mg/dL Final  . Sodium 06/20/2014 144  128 - 145 mEq/L Final  . Potassium 06/20/2014 4.5  3.3 - 4.7 mEq/L Final  . Chloride 06/20/2014 101  98 - 108 mEq/L Final  . CO2 06/20/2014 31  18 - 33 mEq/L Final  . Glucose, Bld 06/20/2014 166* 73 - 118 mg/dL Final  . BUN, Bld 06/20/2014 22  7 - 22 mg/dL Final  . Creat 06/20/2014 1.1  0.6 - 1.2 mg/dl Final  . Total Bilirubin 06/20/2014 0.80  0.20 - 1.60 mg/dl Final  . Alkaline Phosphatase 06/20/2014 231* 26 - 84 U/L Final  . AST 06/20/2014 35  11 - 38 U/L Final  . ALT(SGPT) 06/20/2014 44  10 - 47 U/L Final  . Total Protein 06/20/2014 7.5  6.4 - 8.1 g/dL Final  . Albumin 06/20/2014 3.2* 3.3 - 5.5 g/dL Final  . Calcium 06/20/2014 9.4  8.0 - 10.3 mg/dL Final  Appointment on 06/06/2014  Component Date Value Ref Range Status  . WBC 06/06/2014 5.2  4.0 - 10.0 10e3/uL Final  . RBC 06/06/2014 3.43* 4.20 - 5.70 10e6/uL Final  . HGB 06/06/2014 10.9* 13.0 - 17.1 g/dL Final  . HCT 06/06/2014 33.8* 38.7 - 49.9 % Final  . MCV 06/06/2014 99* 82 - 98 fL Final  . MCH 06/06/2014 31.8  28.0 - 33.4 pg Final  . MCHC 06/06/2014 32.2  32.0 - 35.9  g/dL Final  . RDW 06/06/2014 17.5* 11.1 - 15.7 % Final  . Platelets 06/06/2014 205  145 - 400 10e3/uL Final  . NEUT# 06/06/2014 3.5  1.5 - 6.5 10e3/uL Final  . LYMPH# 06/06/2014 1.1  0.9 - 3.3 10e3/uL Final  . MONO# 06/06/2014 0.6  0.1 - 0.9 10e3/uL Final  . Eosinophils Absolute 06/06/2014 0.1  0.0 - 0.5 10e3/uL Final  . BASO# 06/06/2014 0.0  0.0 - 0.2 10e3/uL Final  . NEUT% 06/06/2014 66.0  40.0 - 80.0 % Final  . LYMPH% 06/06/2014 21.4  14.0 - 48.0 % Final  . MONO% 06/06/2014 10.7  0.0 - 13.0 % Final  . EOS% 06/06/2014 1.3  0.0 - 7.0 % Final  . BASO% 06/06/2014 0.6  0.0 - 2.0 % Final  . CA 19-9 06/06/2014 218.8* <35.0 U/mL Final  . Sodium 06/06/2014 144  128 - 145 mEq/L Final  . Potassium 06/06/2014 4.7  3.3 - 4.7 mEq/L Final  . Chloride 06/06/2014 101  98 - 108 mEq/L Final  . CO2 06/06/2014 26  18 - 33 mEq/L Final  . Glucose, Bld 06/06/2014 184* 73 - 118 mg/dL Final  . BUN, Bld 06/06/2014 19  7 - 22 mg/dL Final  . Creat 06/06/2014 1.1  0.6 - 1.2 mg/dl Final  . Total Bilirubin 06/06/2014 0.70  0.20 - 1.60 mg/dl Final  . Alkaline Phosphatase 06/06/2014 300* 26 - 84 U/L Final  . AST 06/06/2014 187* 11 - 38 U/L Final  . ALT(SGPT) 06/06/2014 179* 10 - 47 U/L Final  . Total Protein 06/06/2014 7.2  6.4 - 8.1 g/dL Final  . Albumin 06/06/2014 3.1* 3.3 - 5.5 g/dL Final  . Calcium  06/06/2014 9.0  8.0 - 10.3 mg/dL Final  Lab on 05/30/2014  Component Date Value Ref Range Status  . Hemoglobin 05/29/2014 8.9* 13.5 - 17.5 g/dL Final  . HCT 05/29/2014 26* 41 - 53 % Final  . Platelets 05/29/2014 225  150 - 399 K/L Final  . WBC 05/29/2014 5.1   Final  . Glucose 05/29/2014 108   Final  . BUN 05/29/2014 22* 4 - 21 mg/dL Final  . Creatinine 05/29/2014 1.0  0.6 - 1.3 mg/dL Final  . Potassium 05/29/2014 4.3  3.4 - 5.3 mmol/L Final  . Sodium 05/29/2014 136* 137 - 147 mmol/L Final  . Alkaline Phosphatase 05/29/2014 209* 25 - 125 U/L Final  . ALT 05/29/2014 70* 10 - 40 U/L Final  . AST 05/29/2014  64* 14 - 40 U/L Final  . Bilirubin, Total 05/29/2014 0.6   Final  . Hgb A1c MFr Bld 05/29/2014 7.1* 4.0 - 6.0 % Final  . TSH 05/29/2014 1.15  0.41 - 5.90 uIU/mL Final  Appointment on 05/30/2014  Component Date Value Ref Range Status  . WBC 05/30/2014 5.3  4.0 - 10.0 10e3/uL Final  . RBC 05/30/2014 3.35* 4.20 - 5.70 10e6/uL Final  . HGB 05/30/2014 10.7* 13.0 - 17.1 g/dL Final  . HCT 05/30/2014 32.9* 38.7 - 49.9 % Final  . MCV 05/30/2014 98  82 - 98 fL Final  . MCH 05/30/2014 31.9  28.0 - 33.4 pg Final  . MCHC 05/30/2014 32.5  32.0 - 35.9 g/dL Final  . RDW 05/30/2014 17.7* 11.1 - 15.7 % Final  . Platelets 05/30/2014 256  145 - 400 10e3/uL Final  . NEUT# 05/30/2014 3.6  1.5 - 6.5 10e3/uL Final  . LYMPH# 05/30/2014 1.0  0.9 - 3.3 10e3/uL Final  . MONO# 05/30/2014 0.6  0.1 - 0.9 10e3/uL Final  . Eosinophils Absolute 05/30/2014 0.1  0.0 - 0.5 10e3/uL Final  . BASO# 05/30/2014 0.0  0.0 - 0.2 10e3/uL Final  . NEUT% 05/30/2014 68.3  40.0 - 80.0 % Final  . LYMPH% 05/30/2014 18.4  14.0 - 48.0 % Final  . MONO% 05/30/2014 10.8  0.0 - 13.0 % Final  . EOS% 05/30/2014 1.9  0.0 - 7.0 % Final  . BASO% 05/30/2014 0.6  0.0 - 2.0 % Final  . CA 19-9 05/30/2014 193.5* <35.0 U/mL Final  . Sodium 05/30/2014 141  128 - 145 mEq/L Final  . Potassium 05/30/2014 4.3  3.3 - 4.7 mEq/L Final  . Chloride 05/30/2014 102  98 - 108 mEq/L Final  . CO2 05/30/2014 27  18 - 33 mEq/L Final  . Glucose, Bld 05/30/2014 172* 73 - 118 mg/dL Final  . BUN, Bld 05/30/2014 20  7 - 22 mg/dL Final  . Creat 05/30/2014 1.0  0.6 - 1.2 mg/dl Final  . Total Bilirubin 05/30/2014 0.60  0.20 - 1.60 mg/dl Final  . Alkaline Phosphatase 05/30/2014 184* 26 - 84 U/L Final  . AST 05/30/2014 41* 11 - 38 U/L Final  . ALT(SGPT) 05/30/2014 62* 10 - 47 U/L Final  . Total Protein 05/30/2014 6.9  6.4 - 8.1 g/dL Final  . Albumin 05/30/2014 3.0* 3.3 - 5.5 g/dL Final  . Calcium 05/30/2014 9.2  8.0 - 10.3 mg/dL Final     Assessment/Plan   This  encounter was created in error - please disregard.

## 2014-08-21 LAB — CBC AND DIFFERENTIAL
HCT: 28 % — AB (ref 41–53)
Hemoglobin: 10 g/dL — AB (ref 13.5–17.5)
PLATELETS: 148 10*3/uL — AB (ref 150–399)
WBC: 5.1 10^3/mL

## 2014-08-21 LAB — BASIC METABOLIC PANEL
BUN: 25 mg/dL — AB (ref 4–21)
Creatinine: 1 mg/dL (ref 0.6–1.3)
Glucose: 127 mg/dL
POTASSIUM: 3.9 mmol/L (ref 3.4–5.3)
SODIUM: 139 mmol/L (ref 137–147)

## 2014-08-22 ENCOUNTER — Other Ambulatory Visit: Payer: Self-pay | Admitting: Nurse Practitioner

## 2014-08-23 ENCOUNTER — Ambulatory Visit (INDEPENDENT_AMBULATORY_CARE_PROVIDER_SITE_OTHER): Payer: Medicare Other | Admitting: General Practice

## 2014-08-23 ENCOUNTER — Other Ambulatory Visit (HOSPITAL_BASED_OUTPATIENT_CLINIC_OR_DEPARTMENT_OTHER): Payer: Medicare Other | Admitting: Lab

## 2014-08-23 ENCOUNTER — Encounter: Payer: Self-pay | Admitting: Hematology & Oncology

## 2014-08-23 ENCOUNTER — Ambulatory Visit (HOSPITAL_BASED_OUTPATIENT_CLINIC_OR_DEPARTMENT_OTHER): Payer: Medicare Other | Admitting: Hematology & Oncology

## 2014-08-23 VITALS — BP 107/56 | HR 62 | Temp 98.2°F | Resp 20 | Ht 69.0 in | Wt 166.0 lb

## 2014-08-23 DIAGNOSIS — C252 Malignant neoplasm of tail of pancreas: Secondary | ICD-10-CM

## 2014-08-23 DIAGNOSIS — C251 Malignant neoplasm of body of pancreas: Secondary | ICD-10-CM

## 2014-08-23 DIAGNOSIS — C787 Secondary malignant neoplasm of liver and intrahepatic bile duct: Secondary | ICD-10-CM

## 2014-08-23 DIAGNOSIS — C250 Malignant neoplasm of head of pancreas: Secondary | ICD-10-CM

## 2014-08-23 LAB — CMP (CANCER CENTER ONLY)
ALK PHOS: 300 U/L — AB (ref 26–84)
ALT(SGPT): 83 U/L — ABNORMAL HIGH (ref 10–47)
AST: 97 U/L — AB (ref 11–38)
Albumin: 3.1 g/dL — ABNORMAL LOW (ref 3.3–5.5)
BUN, Bld: 25 mg/dL — ABNORMAL HIGH (ref 7–22)
CALCIUM: 9.3 mg/dL (ref 8.0–10.3)
CO2: 26 meq/L (ref 18–33)
CREATININE: 1 mg/dL (ref 0.6–1.2)
Chloride: 97 mEq/L — ABNORMAL LOW (ref 98–108)
Glucose, Bld: 157 mg/dL — ABNORMAL HIGH (ref 73–118)
POTASSIUM: 4.5 meq/L (ref 3.3–4.7)
Sodium: 138 mEq/L (ref 128–145)
TOTAL PROTEIN: 7 g/dL (ref 6.4–8.1)
Total Bilirubin: 0.9 mg/dl (ref 0.20–1.60)

## 2014-08-23 LAB — CBC WITH DIFFERENTIAL (CANCER CENTER ONLY)
BASO#: 0 10*3/uL (ref 0.0–0.2)
BASO%: 0.6 % (ref 0.0–2.0)
EOS%: 1.5 % (ref 0.0–7.0)
Eosinophils Absolute: 0.1 10*3/uL (ref 0.0–0.5)
HCT: 32.4 % — ABNORMAL LOW (ref 38.7–49.9)
HEMOGLOBIN: 10.9 g/dL — AB (ref 13.0–17.1)
LYMPH#: 1 10*3/uL (ref 0.9–3.3)
LYMPH%: 21.3 % (ref 14.0–48.0)
MCH: 34.5 pg — AB (ref 28.0–33.4)
MCHC: 33.6 g/dL (ref 32.0–35.9)
MCV: 103 fL — ABNORMAL HIGH (ref 82–98)
MONO#: 0.5 10*3/uL (ref 0.1–0.9)
MONO%: 9.6 % (ref 0.0–13.0)
NEUT#: 3.2 10*3/uL (ref 1.5–6.5)
NEUT%: 67 % (ref 40.0–80.0)
Platelets: 169 10*3/uL (ref 145–400)
RBC: 3.16 10*6/uL — AB (ref 4.20–5.70)
RDW: 15.6 % (ref 11.1–15.7)
WBC: 4.8 10*3/uL (ref 4.0–10.0)

## 2014-08-23 NOTE — Progress Notes (Signed)
Hematology and Oncology Follow Up Visit  [name] [name] [id number] 1923/02/15 81 y.o. 08/23/2014   Principle Diagnosis:  Stage IV pancreatic cancer with mets to liver. Bilateral lower extremity DVT  Current Therapy:    Xeloda s/p 4 cycles  ELIQUIS 2.5 mg by mouth twice a day     Interim History:  Mr.  [name] [name] is back for follow-up. He was hospitalized, again, in early January. He had strep in his blood. He does have a biliary stent in place. I just wonder if his blood infection was not from the biliary stent. This was not exchanged area and he is on antibiotics.  He started his fourth cycle of Xeloda this past Monday. So far, he is done okay with it.  He does have a decubitus ulcer in the sacral. This has a padding on it. The nursing home is trying her best to treat this.  His appetite is okay. I think he needs to eat and drink a little bit more. His weight is down a little bit.  His last tumor marker with the CA-19-9 9 back in early January was 193.  He's had no cough. He's had improved leg swelling. He's had no rashes.  Overall, his performance status is ECOG 2.  He continues on ELIQUIS. He is doing pretty well with the ELIQUIS. There is no bleeding.      Medications:  Current outpatient prescriptions:  .  apixaban (ELIQUIS) 5 MG TABS tablet, Take 1 tablet (5 mg total) by mouth 2 (two) times daily., Disp: , Rfl:  .  bisacodyl (DULCOLAX) 10 MG suppository, Place 1 suppository (10 mg total) rectally daily as needed for moderate constipation., Disp: 12 suppository, Rfl: 0 .  capecitabine (XELODA) 500 MG tablet, Take 3 tablets (1,500 mg total) by mouth 2 (two) times daily after a meal. TAKES 3 TABS (1500) MG BID  ///  ON 14 DAYS OFF 7 DAYS FOR 3 CYCLES.   STARTED 06/06/14, Disp: , Rfl:  .  carvedilol (COREG) 12.5 MG tablet, Take 12.5 mg by mouth 2 (two) times daily with a meal., Disp: , Rfl:  .  docusate sodium 100 MG CAPS, Take 100 mg by mouth 2 (two) times daily., Disp: 60  capsule, Rfl: 0 .  feeding supplement, GLUCERNA SHAKE, (GLUCERNA SHAKE) LIQD, Take 237 mLs by mouth 2 (two) times daily between meals as needed (If pt with <50% meal completion)., Disp: , Rfl: 0 .  HYDROcodone-acetaminophen (NORCO) 10-325 MG per tablet, Take one tablet by mouth every 4 hours as needed for pain, Disp: 20 tablet, Rfl: 0 .  insulin glargine (LANTUS) 100 UNIT/ML injection, Inject 3 Units into the skin at bedtime., Disp: , Rfl:  .  lactulose (CHRONULAC) 10 GM/15ML solution, Take by mouth daily as needed for mild constipation., Disp: , Rfl:  .  lidocaine-prilocaine (EMLA) cream, Apply 1 application topically as needed. (Patient taking differently: Apply 1 application topically as needed (port access). ), Disp: 30 g, Rfl: 0 .  LORazepam (ATIVAN) 0.5 MG tablet, Take 2 tablets (1 mg total) by mouth every 6 (six) hours as needed (nausea/vomiting). (Patient taking differently: Take 0.5-1 mg by mouth See admin instructions. ), Disp: 15 tablet, Rfl: 0 .  mirtazapine (REMERON) 7.5 MG tablet, Take 7.5 mg by mouth at bedtime., Disp: , Rfl:  .  Multiple Vitamin (MULTIVITAMIN WITH MINERALS) TABS tablet, Take 1 tablet by mouth daily., Disp: , Rfl:  .  nitroGLYCERIN (NITROSTAT) 0.4 MG SL tablet, Place 1 tablet (0.4 mg total)  under the tongue every 5 (five) minutes as needed for chest pain., Disp: 90 tablet, Rfl: 3 .  Omega-3 Fatty Acids (FISH OIL) 1000 MG CAPS, Take 1,000 mg by mouth every morning. , Disp: , Rfl:  .  ondansetron (ZOFRAN) 8 MG tablet, Take by mouth 2 (two) times daily as needed for nausea or vomiting. , Disp: , Rfl:  .  oxyCODONE (OXY IR/ROXICODONE) 5 MG immediate release tablet, Take 1 tablet (5 mg total) by mouth 2 (two) times daily., Disp: 30 tablet, Rfl: 0 .  polyethylene glycol (MIRALAX / GLYCOLAX) packet, Take 17 g by mouth 2 (two) times daily as needed for mild constipation., Disp: , Rfl:  .  polyvinyl alcohol (LIQUIFILM TEARS) 1.4 % ophthalmic solution, Place 1 drop into both eyes  4 (four) times daily., Disp: , Rfl:  .  pravastatin (PRAVACHOL) 40 MG tablet, Take 40 mg by mouth every evening. , Disp: , Rfl:  .  pregabalin (LYRICA) 75 MG capsule, Take 75 mg by mouth every morning. , Disp: , Rfl:  .  prochlorperazine (COMPAZINE) 5 MG tablet, Take 5 mg by mouth every 6 (six) hours as needed for nausea or vomiting., Disp: , Rfl:  .  ranitidine (ZANTAC) 150 MG tablet, Take 150 mg by mouth 2 (two) times daily., Disp: , Rfl:  .  saccharomyces boulardii (FLORASTOR) 250 MG capsule, Take 250 mg by mouth 2 (two) times daily., Disp: , Rfl:  .  temazepam (RESTORIL) 15 MG capsule, Take 15 mg by mouth at bedtime. , Disp: , Rfl:  .  [DISCONTINUED] Calcium Carbonate-Vitamin D (CALTRATE 600+D) 600-400 MG-UNIT per tablet, Take 1 tablet by mouth daily. , Disp: , Rfl:   Allergies: No Known Allergies  Past Medical History, Surgical history, Social history, and Family History were reviewed and updated.  Review of Systems: As above  Physical Exam:  height is 5\' 9"  (1.753 m) and weight is 166 lb (75.297 kg). His oral temperature is 98.2 F (36.8 C). His blood pressure is 107/56 and his pulse is 62. His respiration is 20.   Elderly white gentleman in no obvious distress. Head and neck exam shows no ocular or oral lesions. He has no palpable cervical or supraclavicular lymph nodes. There might be some slight temporal muscle wasting. Lungs are clear. Cardiac exam regular rate and rhythm with no murmurs, rubs or bruits. Abdomen is soft. There is no fluid wave. There is no palpable liver or spleen tip. He has no palpable abdominal mass. Back exam shows no tenderness over the spine, ribs or hips. Extremities shows some trace edema in his lower legs. He has some osteoarthritic changes in his joints. Skin exam shows no rashes, ecchymoses or petechia. He does have some skin breakdown in the lower sacral area. Neurological exam is nonfocal.  Lab Results  Component Value Date   WBC 4.8 08/23/2014   HGB  10.9* 08/23/2014   HCT 32.4* 08/23/2014   MCV 103* 08/23/2014   PLT 169 08/23/2014     Chemistry      Component Value Date/Time   NA 138 08/23/2014 1309   NA 139 08/21/2014   NA 133* 07/22/2014 0500   K 4.5 08/23/2014 1309   K 3.9 08/21/2014   CL 97* 08/23/2014 1309   CL 100 07/22/2014 0500   CO2 26 08/23/2014 1309   CO2 27 07/22/2014 0500   BUN 25* 08/23/2014 1309   BUN 25* 08/21/2014   BUN 22 07/22/2014 0500   CREATININE 1.0 08/23/2014 1309  CREATININE 1.0 08/21/2014   CREATININE 1.00 07/22/2014 0500   GLU 127 08/21/2014      Component Value Date/Time   CALCIUM 9.3 08/23/2014 1309   CALCIUM 8.9 07/22/2014 0500   ALKPHOS 300* 08/23/2014 1309   ALKPHOS 194* 07/21/2014 0530   AST 97* 08/23/2014 1309   AST 32 07/21/2014 0530   ALT 83* 08/23/2014 1309   ALT 30 07/21/2014 0530   BILITOT 0.90 08/23/2014 1309   BILITOT 0.9 07/21/2014 0530         Impression and Plan: Mr. Ragain is 81 year old gentleman. He has metastatic pancreatic cancer. He is on Xeloda and tolerating this okay. It is really hard to tell at this is working. By the weight loss, I am worried.   By the worsening in his liver function tests, I would have to think that he is not responding..  We see him back in one month, I will get a CT scan the same day that I see him.  I'm just thankful that his quality of life is not suffering much. I spent about 30 minutes with he and his friend. I  Again, we probably should do some Dopplers of his legs to see how his thromboembolic disease is responding. We may do some scans after we see him back.   Volanda Napoleon, MD 2/4/20162:02 PM

## 2014-08-24 LAB — PREALBUMIN: PREALBUMIN: 11.3 mg/dL — AB (ref 17.0–34.0)

## 2014-08-24 LAB — LACTATE DEHYDROGENASE: LDH: 166 U/L (ref 94–250)

## 2014-08-24 LAB — CANCER ANTIGEN 19-9: CA 19-9: 121.7 U/mL — ABNORMAL HIGH (ref <35.0)

## 2014-08-27 ENCOUNTER — Encounter: Payer: Self-pay | Admitting: Internal Medicine

## 2014-08-27 ENCOUNTER — Non-Acute Institutional Stay (SKILLED_NURSING_FACILITY): Payer: Medicare Other | Admitting: Internal Medicine

## 2014-08-27 DIAGNOSIS — C787 Secondary malignant neoplasm of liver and intrahepatic bile duct: Secondary | ICD-10-CM

## 2014-08-27 DIAGNOSIS — D638 Anemia in other chronic diseases classified elsewhere: Secondary | ICD-10-CM

## 2014-08-27 DIAGNOSIS — G609 Hereditary and idiopathic neuropathy, unspecified: Secondary | ICD-10-CM

## 2014-08-27 DIAGNOSIS — R531 Weakness: Secondary | ICD-10-CM

## 2014-08-27 DIAGNOSIS — I5022 Chronic systolic (congestive) heart failure: Secondary | ICD-10-CM

## 2014-08-27 DIAGNOSIS — E1122 Type 2 diabetes mellitus with diabetic chronic kidney disease: Secondary | ICD-10-CM

## 2014-08-27 DIAGNOSIS — N189 Chronic kidney disease, unspecified: Secondary | ICD-10-CM

## 2014-08-27 DIAGNOSIS — I10 Essential (primary) hypertension: Secondary | ICD-10-CM

## 2014-08-27 DIAGNOSIS — R1010 Upper abdominal pain, unspecified: Secondary | ICD-10-CM

## 2014-08-27 DIAGNOSIS — C801 Malignant (primary) neoplasm, unspecified: Secondary | ICD-10-CM

## 2014-08-27 DIAGNOSIS — D649 Anemia, unspecified: Secondary | ICD-10-CM

## 2014-08-27 DIAGNOSIS — C252 Malignant neoplasm of tail of pancreas: Secondary | ICD-10-CM

## 2014-08-27 NOTE — Progress Notes (Signed)
Patient ID: [name] [name], male   DOB: 1923-03-23, 81 y.o.   MRN: [id number]    HISTORY AND PHYSICAL  Location:  [facility] Room Number: 76 Place of Service: SNF (31)   Extended Emergency Contact Information Primary Emergency Contact: [name] [place] of [place] Phone: [phone] Relation: Other Secondary Emergency Contact: Godwin,Rose Address: [address]          [place], [place] [address] [name] of [place] Phone: [phone] Relation: Other  Advanced Directive information Does patient have an advance directive?: No, Would patient like information on creating an advanced directive?: No - patient declined information  Chief Complaint  Patient presents with  . Readmit To SNF    Following hospitalization    HPI:  Patient was readmitted to skilled nursing facility, [facility] 07/23/14 following hospitalization between 07/19/14 and 07/23/14 reviewed at the time of admission his fever was 103. Blood culture showed a single culture positive for strep viridans. Significance is uncertain, however, he was treated with ceftriaxone for 7 days and then amoxicillin 14 days. Since return to this facility he has felt well and appetite has increased.  Patient had been in the hospital between 07/02/14 and 07/06/14 with a fever of 104  Again, since return to our facility 07/23/14 he has not run any additional fevers and has felt well.  This is a complicated patient to lose initially admitted to our facility 04/04/14 following a hospitalization between 03/29/14 and 04/04/14. During that hospitalization a pancreatic mass was noted and biopsied with results of adenocarcinoma.  This patient has had other cancers including a bladder cancer and prostate cancer. He was found to have metastatic cancer to the liver as well as the primary pancreatic cancer. Dr. Carlean Purl and Dr. Marin Olp have been involved most recently with his pancreatic cancer and  metastatic disease.  Other conditions include a cardiomyopathy with an ejection fraction 75%, chronic systolic heart failure, hypertension, transaminitis, anemia, generalized weakness, failure to thrive, cognitive impairment.  Past Medical History  Diagnosis Date  . Diverticulosis of colon (without mention of hemorrhage)   . Irritable bowel syndrome   . Personal history of malignant neoplasm of prostate     s/p  radioactive seed implants 2000  . Unspecified hereditary and idiopathic peripheral neuropathy   . Nonischemic cardiomyopathy cardiologist-  dr Aundra Dubin    EF 25%  per last echo 2012 and cardiologist note--  2007-  ef  40-45%;  2008- ef 35-40%;  2010- ef 30-35%;  2012- ef 25%  . Sinus bradycardia   . Hypertension   . Arthritis     hands  . Hyperlipidemia   . History of DVT of lower extremity     2002  ,   left leg  . History of adenomatous polyp of colon     2001  . History of basal cell carcinoma excision   . Anticoagulated on Coumadin   . Type 2 diabetes, diet controlled   . GERD (gastroesophageal reflux disease)   . LBBB (left bundle branch block)   . Wears glasses   . Wears hearing aid     BILATERAL  . Unsteady gait   . DDD (degenerative disc disease)     cervical and lumbar  . DJD (degenerative joint disease)     hips  . Lower urinary tract symptoms (LUTS)   . 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 Coronary artery disease     per cath 2002  non-obstructive cad  . Chronic systolic heart failure   . H/O fracture of nose     12-30-2013-  pt fell at home open nose bone fx  . Abdominal pain 12/20/2013  . Abnormal MRI of abdomen 01/31/2014  . Acute encephalopathy 07/02/2014  . Acute thromboembolism of deep veins of lower extremity 05/03/2007    Qualifier: History of  By: Doy Mince LPN, Megan  88/50/27 Doppler LLE: DVT at left femoral and popliteal veins not completely occlusive.  Will anticoagulate with Eliquis 2.63m bid.    . Anemia of chronic disease 02/20/2013  . CARDIOMYOPATHY 07/06/2007    Qualifier: Diagnosis of  By: NLenna GilfordMD, SDeborra Medina  . Constipation 04/04/2014  . Coronary atherosclerosis 07/06/2007    Qualifier: Diagnosis of  By: NLenna GilfordMD, [place]EASE 05/03/2007    Qualifier: Diagnosis of  By: RDoy MinceLPN, Megan    . DVT (deep venous thrombosis)   . Edema 05/28/2014  . Failure to thrive in adult 03/30/2014  . Fall in elderly patient 12/30/2013  . Fever 07/20/2014  . Frailty 03/30/2014  . FUO (fever of unknown origin) 05/28/2014    05/28/14 CXR right lower lobe bronchopneumonia.    .Marland KitchenGERD 05/03/2007    Qualifier: Diagnosis of  By: RDoy MinceLPN, Megan    . Hereditary and idiopathic peripheral neuropathy 07/06/2007    Qualifier: Diagnosis of  By: NLenna GilfordMD, SDeborra Medina  . HYPERLIPIDEMIA 05/03/2007    Qualifier: Diagnosis of  By: RDoy MinceLPN, Megan    . HYPERTENSION, BENIGN 06/23/2010    Qualifier: Diagnosis of  By: BOlevia Perches MD, FGlenetta Hew  . Insomnia 04/04/2014  . Liver metastasis 04/01/2014  . Malnutrition of moderate degree 03/30/2014    05/01/14 albumin 2.8 05/29/14 albumin 3.0   . Metastatic cancer 03/29/2014  . Normocytic anemia 03/30/2014    04/10/14 Hgb 9.3   . Pancreatic cancer 04/06/2014    06/06/14 Oncology: stage IV pancreatic cancer with Mets to liver: CA 19-9 400--194. CT showed more liver metastases. Xeloda 15023mbid x 14days on and 7 days off. 3 cycles of tx and then repeat the scans.    . Pressure ulcer stage II 08/15/2014    08/14/14 L scrotum small PU-not infected-tx initiated.    . Protein-calorie malnutrition, severe 07/05/2014  . SPINAL STENOSIS, LUMBAR 07/06/2007    Qualifier: Diagnosis of  By: NaLenna GilfordD, ScDeborra Medina . Streptococcal bacteremia 07/23/2014  . Transaminitis 03/30/2014    04/10/14 Alkaline phosphatase 126(39-117) 05/01/14 AST 33, ALT 45, alk  Phos 75 05/29/14 AST 64, ALT 70 alk phos 209   . Type 2  diabetes mellitus 05/03/2007    Qualifier: Diagnosis of  By: ReDoy MincePN, Megan  1174/12/87gb A1c 7.1    . Weakness generalized 04/02/2014    Past Surgical History  Procedure Laterality Date  . Knee arthroscopy Left 1991  . Ercp with sphincterotomy and stone removal  05/ 1993  &  05-11-2006  . Total knee arthroplasty  10/05/2011    Procedure: TOTAL KNEE ARTHROPLASTY;  Surgeon: FrGearlean AlfMD;  Location: [facility];  Service: Orthopedics;  Laterality: Right;  . Transurethral resection of bladder tumor N/A 09/30/2012    Procedure: TRANSURETHRAL RESECTION OF BLADDER TUMOR (TURBT);  Surgeon: StFranchot GalloMD;  Location: [facility];  Service: Urology;  Laterality: N/A;  WITH MITOMYCIN INSTILLATION   . Eus  N/A 01/04/2014    Procedure: ESOPHAGEAL ENDOSCOPIC ULTRASOUND (EUS) RADIAL;  Surgeon: [name], MD;  Location: [facility];  Service: Endoscopy;  Laterality: N/A;  . Radioactive prostate seed implants  01/ 2000  . Mohs surgery  03/ 2009    LEFT EAR BASAL CELL CARCINOMA  . Tonsillectomy  AS CHILD  . Cholecystectomy open  1985  . Appendectomy  child  . Inguinal hernia repair Bilateral 1998    recurrent RIH  w/ repair 06-21-2006  . Total knee arthroplasty Left 02-21-2007    AND 04-21-2007  CLOSED MANIPULATION  . Cataract extraction w/ intraocular lens implant Right 2004  . Lumbar spine surgery  X2  . Cardiac catheterization  06-14-2001  dr Darnell Level brodie    non-obstructive CAD/  LM 30%,  pLAD 50%,  mLAD 30%,  RCA  &  CFX  irregularites ,  global hypokinesis,  ef 40%  . Cardiovascular stress test  02-07-2007   dr Darnell Level brodie    negative for ischemia,  low risk myoview perfusion study  . Transthoracic echocardiogram  01-28-2011    moderate LVH/  ef 25%/  diffuse hypokinesis/  mild LAE/  mild TR/  trivial AR & MR  . Cystoscopy w/ retrogrades Bilateral 03/09/2014    Procedure: CYSTOSCOPY WITH BILATERAL RETROGRADE PYELOGRAM BLADDER BX AND RENAL WASHINGS.;  Surgeon: Jorja Loa,  MD;  Location: [facility];  Service: Urology;  Laterality: Bilateral;  . Eus N/A 04/05/2014    Procedure: UPPER ENDOSCOPIC ULTRASOUND (EUS) LINEAR;  Surgeon: [name], MD;  Location: [facility];  Service: Endoscopy;  Laterality: N/A;  radial linear  . Endoscopic retrograde cholangiopancreatography (ercp) with propofol N/A 04/12/2014    Procedure: ENDOSCOPIC RETROGRADE CHOLANGIOPANCREATOGRAPHY (ERCP) WITH PROPOFOL;  Surgeon: [name], MD;  Location: [facility];  Service: Endoscopy;  Laterality: N/A;  metal stent  . Biliary stent placement N/A 04/12/2014    Procedure: BILIARY STENT PLACEMENT;  Surgeon: [name], MD;  Location: [facility];  Service: Endoscopy;  Laterality: N/A;    Patient Care Team: Doe-Hyun Kyra Searles, DO as PCP - General (Internal Medicine) Estill Dooms, MD as PCP - Internal Medicine (Internal Medicine) Jorja Loa, MD as Consulting Physician (Urology) Volanda Napoleon, MD as Consulting Physician (Oncology) Gearlean Alf, MD as Consulting Physician (Orthopedic Surgery) Larey Dresser, MD as Consulting Physician (Cardiology) Gatha Mayer, MD as Consulting Physician (Gastroenterology)  History   Social History  . Marital Status: Widowed    Spouse Name: N/A    Number of Children: 0  . Years of Education: N/A   Occupational History  .     Social History Main Topics  . Smoking status: Former Smoker -- 1.00 packs/day for 10 years    Types: Cigarettes    Start date: 04/10/1954    Quit date: 09/10/1982  . Smokeless tobacco: Never Used     Comment: quit 32 years ago  . Alcohol Use: No  . Drug Use: No  . Sexual Activity: Not on file   Other Topics Concern  . Not on file   Social History Narrative   Married, no children.      reports that he quit smoking about 31 years ago. His smoking use included Cigarettes. He started smoking about 60 years ago. He has a 10 pack-year smoking history. He has never used smokeless  tobacco. He reports that he does not drink alcohol or use illicit drugs.   Family Status  Relation  Status Death Age  . Brother      throat cancer  . Father Deceased     coronary artery disease    Immunization History  Administered Date(s) Administered  . Influenza Split 06/20/2011, 04/22/2012, 05/13/2013  . Influenza Whole 05/07/2005, 05/05/2006, 06/04/2009, 08/21/2010  . Influenza,inj,Quad PF,36+ Mos 04/06/2014  . Influenza-Unspecified 05/23/2014  . PPD Test 04/03/2014    No Known Allergies  Medications: Patient's Medications  New Prescriptions   No medications on file  Previous Medications   APIXABAN (ELIQUIS) 5 MG TABS TABLET    Take 1 tablet (5 mg total) by mouth 2 (two) times daily.   BISACODYL (DULCOLAX) 10 MG SUPPOSITORY    Place 1 suppository (10 mg total) rectally daily as needed for moderate constipation.   CAPECITABINE (XELODA) 500 MG TABLET    Take 3 tablets (1,500 mg total) by mouth 2 (two) times daily after a meal. TAKES 3 TABS (1500) MG BID  ///  ON 14 DAYS OFF 7 DAYS FOR 3 CYCLES.   STARTED 06/06/14   CARVEDILOL (COREG) 12.5 MG TABLET    Take 12.5 mg by mouth 2 (two) times daily with a meal.   DOCUSATE SODIUM 100 MG CAPS    Take 100 mg by mouth 2 (two) times daily.   FEEDING SUPPLEMENT, GLUCERNA SHAKE, (GLUCERNA SHAKE) LIQD    Take 237 mLs by mouth 2 (two) times daily between meals as needed (If pt with <50% meal completion).   HYDROCODONE-ACETAMINOPHEN (NORCO) 10-325 MG PER TABLET    Take one tablet by mouth every 4 hours as needed for pain   INSULIN GLARGINE (LANTUS) 100 UNIT/ML INJECTION    Inject 3 Units into the skin at bedtime.   LACTULOSE (CHRONULAC) 10 GM/15ML SOLUTION    Take by mouth daily as needed for mild constipation.   LIDOCAINE-PRILOCAINE (EMLA) CREAM    Apply 1 application topically as needed.   LORAZEPAM (ATIVAN) 0.5 MG TABLET    Take 2 tablets (1 mg total) by mouth every 6 (six) hours as needed (nausea/vomiting).   MIRTAZAPINE (REMERON) 7.5 MG  TABLET    Take 7.5 mg by mouth at bedtime.   MULTIPLE VITAMIN (MULTIVITAMIN WITH MINERALS) TABS TABLET    Take 1 tablet by mouth daily.   NITROGLYCERIN (NITROSTAT) 0.4 MG SL TABLET    Place 1 tablet (0.4 mg total) under the tongue every 5 (five) minutes as needed for chest pain.   OMEGA-3 FATTY ACIDS (FISH OIL) 1000 MG CAPS    Take 1,000 mg by mouth every morning.    ONDANSETRON (ZOFRAN) 8 MG TABLET    Take by mouth 2 (two) times daily as needed for nausea or vomiting.    OXYCODONE (OXY IR/ROXICODONE) 5 MG IMMEDIATE RELEASE TABLET    Take 1 tablet (5 mg total) by mouth 2 (two) times daily.   POLYETHYLENE GLYCOL (MIRALAX / GLYCOLAX) PACKET    Take 17 g by mouth 2 (two) times daily as needed for mild constipation.   POLYVINYL ALCOHOL (LIQUIFILM TEARS) 1.4 % OPHTHALMIC SOLUTION    Place 1 drop into both eyes 4 (four) times daily.   PRAVASTATIN (PRAVACHOL) 40 MG TABLET    Take 40 mg by mouth every evening.    PREGABALIN (LYRICA) 75 MG CAPSULE    Take 75 mg by mouth every morning.    PROCHLORPERAZINE (COMPAZINE) 5 MG TABLET    Take 5 mg by mouth every 6 (six) hours as needed for nausea or vomiting.   RANITIDINE (ZANTAC) 150 MG  TABLET    Take 150 mg by mouth 2 (two) times daily.   SACCHAROMYCES BOULARDII (FLORASTOR) 250 MG CAPSULE    Take 250 mg by mouth 2 (two) times daily.   TEMAZEPAM (RESTORIL) 15 MG CAPSULE    Take 15 mg by mouth at bedtime.   Modified Medications   No medications on file  Discontinued Medications   No medications on file    Review of Systems  Constitutional: Negative for fever, chills and diaphoresis.       Generalized weakness  HENT: Positive for hearing loss. Negative for congestion, ear discharge, ear pain, nosebleeds, sore throat and tinnitus.   Eyes: Negative for photophobia, pain, discharge and redness.  Respiratory: Negative for cough, shortness of breath, wheezing and stridor.   Cardiovascular: Positive for leg swelling. Negative for chest pain and palpitations.        Trace  Gastrointestinal: Positive for abdominal pain and diarrhea. Negative for nausea, vomiting, constipation and blood in stool.  Endocrine: Negative for polydipsia.  Genitourinary: Positive for frequency. Negative for dysuria, urgency, hematuria and flank pain.  Musculoskeletal: Negative for myalgias, back pain and neck pain.       Ambulates with walker  Skin: Negative for rash.  Allergic/Immunologic: Negative for environmental allergies.  Neurological: Positive for weakness. Negative for dizziness, tremors, seizures and headaches.  Hematological: Does not bruise/bleed easily.  Psychiatric/Behavioral: Negative for suicidal ideas and hallucinations. The patient is not nervous/anxious.     Filed Vitals:   08/27/14 1708  BP: 130/70  Pulse: 72  Temp: 97.6 F (36.4 C)  Resp: 20  Height: 5' 10"  (1.778 m)  Weight: 161 lb 1.6 oz (73.074 kg)   Body mass index is 23.12 kg/(m^2).  Physical Exam  Constitutional: He is oriented to person, place, and time. He appears well-developed and well-nourished.  HENT:  Head: Normocephalic and atraumatic.  Mouth/Throat: Oropharynx is clear and moist.  Eyes: Conjunctivae and EOM are normal. Pupils are equal, round, and reactive to light. Right eye exhibits no discharge. Left eye exhibits no discharge. No scleral icterus.  Neck: Normal range of motion. Neck supple. No JVD present. No tracheal deviation present. No thyromegaly present.  Cardiovascular: Normal rate, regular rhythm and normal heart sounds.   Pulmonary/Chest: Effort normal and breath sounds normal. No stridor. He has no wheezes. He has no rales. He exhibits no tenderness.  Abdominal: Soft. Bowel sounds are normal. He exhibits no distension. There is no tenderness. There is no rebound and no guarding.  Minimal epigastric tenderness.  No ascites  Musculoskeletal: Normal range of motion. He exhibits edema. He exhibits no tenderness.  LLE  Lymphadenopathy:    He has no cervical adenopathy.    Neurological: He is alert and oriented to person, place, and time. No cranial nerve deficit.  Skin: Skin is warm and dry. No rash noted. No erythema. No pallor.  Intergluteal cleft redness.   Psychiatric: He has a normal mood and affect. His behavior is normal.   Appointment on 08/23/2014  Component Date Value Ref Range Status  . WBC 08/23/2014 4.8  4.0 - 10.0 10e3/uL Final  . RBC 08/23/2014 3.16* 4.20 - 5.70 10e6/uL Final  . HGB 08/23/2014 10.9* 13.0 - 17.1 g/dL Final  . HCT 08/23/2014 32.4* 38.7 - 49.9 % Final  . MCV 08/23/2014 103* 82 - 98 fL Final  . MCH 08/23/2014 34.5* 28.0 - 33.4 pg Final  . MCHC 08/23/2014 33.6  32.0 - 35.9 g/dL Final  . RDW 08/23/2014 15.6  11.1 -  15.7 % Final  . Platelets 08/23/2014 169  145 - 400 10e3/uL Final  . NEUT# 08/23/2014 3.2  1.5 - 6.5 10e3/uL Final  . LYMPH# 08/23/2014 1.0  0.9 - 3.3 10e3/uL Final  . MONO# 08/23/2014 0.5  0.1 - 0.9 10e3/uL Final  . Eosinophils Absolute 08/23/2014 0.1  0.0 - 0.5 10e3/uL Final  . BASO# 08/23/2014 0.0  0.0 - 0.2 10e3/uL Final  . NEUT% 08/23/2014 67.0  40.0 - 80.0 % Final  . LYMPH% 08/23/2014 21.3  14.0 - 48.0 % Final  . MONO% 08/23/2014 9.6  0.0 - 13.0 % Final  . EOS% 08/23/2014 1.5  0.0 - 7.0 % Final  . BASO% 08/23/2014 0.6  0.0 - 2.0 % Final  . LDH 08/23/2014 166  94 - 250 U/L Final  . CA 19-9 08/23/2014 121.7* <35.0 U/mL Final  . Prealbumin 08/23/2014 11.3* 17.0 - 34.0 mg/dL Final  . Sodium 08/23/2014 138  128 - 145 mEq/L Final  . Potassium 08/23/2014 4.5  3.3 - 4.7 mEq/L Final  . Chloride 08/23/2014 97* 98 - 108 mEq/L Final  . CO2 08/23/2014 26  18 - 33 mEq/L Final  . Glucose, Bld 08/23/2014 157* 73 - 118 mg/dL Final  . BUN, Bld 08/23/2014 25* 7 - 22 mg/dL Final  . Creat 08/23/2014 1.0  0.6 - 1.2 mg/dl Final  . Total Bilirubin 08/23/2014 0.90  0.20 - 1.60 mg/dl Final  . Alkaline Phosphatase 08/23/2014 300* 26 - 84 U/L Final  . AST 08/23/2014 97* 11 - 38 U/L Final  . ALT(SGPT) 08/23/2014 83* 10 - 47 U/L  Final  . Total Protein 08/23/2014 7.0  6.4 - 8.1 g/dL Final  . Albumin 08/23/2014 3.1* 3.3 - 5.5 g/dL Final  . Calcium 08/23/2014 9.3  8.0 - 10.3 mg/dL Final  Lab on 08/22/2014  Component Date Value Ref Range Status  . Hemoglobin 08/21/2014 10.0* 13.5 - 17.5 g/dL Final  . HCT 08/21/2014 28* 41 - 53 % Final  . Platelets 08/21/2014 148* 150 - 399 K/L Final  . WBC 08/21/2014 5.1   Final  . Glucose 08/21/2014 127   Final  . BUN 08/21/2014 25* 4 - 21 mg/dL Final  . Creatinine 08/21/2014 1.0  0.6 - 1.3 mg/dL Final  . Potassium 08/21/2014 3.9  3.4 - 5.3 mmol/L Final  . Sodium 08/21/2014 139  137 - 147 mmol/L Final  Appointment on 08/01/2014  Component Date Value Ref Range Status  . WBC 08/01/2014 6.4  4.0 - 10.0 10e3/uL Final  . RBC 08/01/2014 3.29* 4.20 - 5.70 10e6/uL Final  . HGB 08/01/2014 11.3* 13.0 - 17.1 g/dL Final  . HCT 08/01/2014 34.2* 38.7 - 49.9 % Final  . MCV 08/01/2014 104* 82 - 98 fL Final  . MCH 08/01/2014 34.3* 28.0 - 33.4 pg Final  . MCHC 08/01/2014 33.0  32.0 - 35.9 g/dL Final  . RDW 08/01/2014 15.9* 11.1 - 15.7 % Final  . Platelets 08/01/2014 181  145 - 400 10e3/uL Final  . NEUT# 08/01/2014 4.4  1.5 - 6.5 10e3/uL Final  . LYMPH# 08/01/2014 1.4  0.9 - 3.3 10e3/uL Final  . MONO# 08/01/2014 0.5  0.1 - 0.9 10e3/uL Final  . Eosinophils Absolute 08/01/2014 0.1  0.0 - 0.5 10e3/uL Final  . BASO# 08/01/2014 0.0  0.0 - 0.2 10e3/uL Final  . NEUT% 08/01/2014 68.3  40.0 - 80.0 % Final  . LYMPH% 08/01/2014 22.0  14.0 - 48.0 % Final  . MONO% 08/01/2014 7.4  0.0 - 13.0 %  Final  . EOS% 08/01/2014 2.0  0.0 - 7.0 % Final  . BASO% 08/01/2014 0.3  0.0 - 2.0 % Final  . CA 19-9 08/01/2014 192.6* <35.0 U/mL Final  . LDH 08/01/2014 205  94 - 250 U/L Final  . Prealbumin 08/01/2014 16.1* 17.0 - 34.0 mg/dL Final  . Sodium 08/01/2014 139  128 - 145 mEq/L Final  . Potassium 08/01/2014 4.6  3.3 - 4.7 mEq/L Final  . Chloride 08/01/2014 98  98 - 108 mEq/L Final  . CO2 08/01/2014 29  18 - 33  mEq/L Final  . Glucose, Bld 08/01/2014 198* 73 - 118 mg/dL Final  . BUN, Bld 08/01/2014 23* 7 - 22 mg/dL Final  . Creat 08/01/2014 1.1  0.6 - 1.2 mg/dl Final  . Total Bilirubin 08/01/2014 0.80  0.20 - 1.60 mg/dl Final  . Alkaline Phosphatase 08/01/2014 294* 26 - 84 U/L Final  . AST 08/01/2014 115* 11 - 38 U/L Final  . ALT(SGPT) 08/01/2014 66* 10 - 47 U/L Final  . Total Protein 08/01/2014 7.6  6.4 - 8.1 g/dL Final  . Albumin 08/01/2014 3.3  3.3 - 5.5 g/dL Final  . Calcium 08/01/2014 9.9  8.0 - 10.3 mg/dL Final  Admission on 07/19/2014, Discharged on 07/23/2014  Component Date Value Ref Range Status  . Lactic Acid, Venous 07/19/2014 2.11  0.5 - 2.2 mmol/L Final  . WBC 07/19/2014 5.7  4.0 - 10.5 K/uL Final  . RBC 07/19/2014 2.97* 4.22 - 5.81 MIL/uL Final  . Hemoglobin 07/19/2014 10.0* 13.0 - 17.0 g/dL Final  . HCT 07/19/2014 29.9* 39.0 - 52.0 % Final  . MCV 07/19/2014 100.7* 78.0 - 100.0 fL Final  . MCH 07/19/2014 33.7  26.0 - 34.0 pg Final  . MCHC 07/19/2014 33.4  30.0 - 36.0 g/dL Final  . RDW 07/19/2014 17.1* 11.5 - 15.5 % Final  . Platelets 07/19/2014 168  150 - 400 K/uL Final  . Neutrophils Relative % 07/19/2014 80* 43 - 77 % Final  . Neutro Abs 07/19/2014 4.5  1.7 - 7.7 K/uL Final  . Lymphocytes Relative 07/19/2014 11* 12 - 46 % Final  . Lymphs Abs 07/19/2014 0.6* 0.7 - 4.0 K/uL Final  . Monocytes Relative 07/19/2014 9  3 - 12 % Final  . Monocytes Absolute 07/19/2014 0.5  0.1 - 1.0 K/uL Final  . Eosinophils Relative 07/19/2014 0  0 - 5 % Final  . Eosinophils Absolute 07/19/2014 0.0  0.0 - 0.7 K/uL Final  . Basophils Relative 07/19/2014 0  0 - 1 % Final  . Basophils Absolute 07/19/2014 0.0  0.0 - 0.1 K/uL Final  . Sodium 07/19/2014 132* 135 - 145 mmol/L Final   Please note change in reference range.  . Potassium 07/19/2014 3.8  3.5 - 5.1 mmol/L Final   Please note change in reference range.  . Chloride 07/19/2014 98  96 - 112 mEq/L Final  . CO2 07/19/2014 26  19 - 32 mmol/L  Final  . Glucose, Bld 07/19/2014 217* 70 - 99 mg/dL Final  . BUN 07/19/2014 24* 6 - 23 mg/dL Final  . Creatinine, Ser 07/19/2014 1.09  0.50 - 1.35 mg/dL Final  . Calcium 07/19/2014 8.8  8.4 - 10.5 mg/dL Final  . Total Protein 07/19/2014 6.7  6.0 - 8.3 g/dL Final  . Albumin 07/19/2014 3.4* 3.5 - 5.2 g/dL Final  . AST 07/19/2014 76* 0 - 37 U/L Final  . ALT 07/19/2014 48  0 - 53 U/L Final  . Alkaline Phosphatase 07/19/2014 278*  39 - 117 U/L Final  . Total Bilirubin 07/19/2014 0.6  0.3 - 1.2 mg/dL Final  . GFR calc non Af Amer 07/19/2014 58* >90 mL/min Final  . GFR calc Af Amer 07/19/2014 67* >90 mL/min Final   Comment: (NOTE) The eGFR has been calculated using the CKD EPI equation. This calculation has not been validated in all clinical situations. eGFR's persistently <90 mL/min signify possible Chronic Kidney Disease.   . Anion gap 07/19/2014 8  5 - 15 Final  . Color, Urine 07/19/2014 YELLOW  YELLOW Final  . APPearance 07/19/2014 CLEAR  CLEAR Final  . Specific Gravity, Urine 07/19/2014 1.008  1.005 - 1.030 Final  . pH 07/19/2014 5.5  5.0 - 8.0 Final  . Glucose, UA 07/19/2014 NEGATIVE  NEGATIVE mg/dL Final  . Hgb urine dipstick 07/19/2014 SMALL* NEGATIVE Final  . Bilirubin Urine 07/19/2014 NEGATIVE  NEGATIVE Final  . Ketones, ur 07/19/2014 NEGATIVE  NEGATIVE mg/dL Final  . Protein, ur 07/19/2014 NEGATIVE  NEGATIVE mg/dL Final  . Urobilinogen, UA 07/19/2014 0.2  0.0 - 1.0 mg/dL Final  . Nitrite 07/19/2014 NEGATIVE  NEGATIVE Final  . Leukocytes, UA 07/19/2014 NEGATIVE  NEGATIVE Final  . RBC / HPF 07/19/2014 0-2  <3 RBC/hpf Final  . Specimen Description 07/19/2014 BLOOD R CHEST PORT   Final  . Special Requests 07/19/2014 BOTTLES DRAWN AEROBIC AND ANAEROBIC 5ML   Final  . Culture 07/19/2014    Final                   Value:VIRIDANS STREPTOCOCCUS Note: THE SIGNIFICANCE OF ISOLATING THIS ORGANISM FROM A SINGLE SET OF BLOOD CULTURES WHEN MULTIPLE SETS ARE DRAWN IS UNCERTAIN. PLEASE NOTIFY  THE MICROBIOLOGY DEPARTMENT WITHIN ONE WEEK IF SPECIATION AND SENSITIVITIES ARE REQUIRED. Note: Gram Stain Report Called to,Read Back By and Verified With: CINDY HUGHEY 07/21/14 @ 2:14PM BY RUSCOE A. Performed at [facility]   . Report Status 07/19/2014 07/22/2014 FINAL   Final  . Specimen Description 07/20/2014 BLOOD LEFT ANTECUBITAL   Final  . Special Requests 07/20/2014 BOTTLES DRAWN AEROBIC AND ANAEROBIC 5ML   Final  . Culture 07/20/2014    Final                   Value:NO GROWTH 5 DAYS Performed at [facility]   . Report Status 07/20/2014 07/26/2014 FINAL   Final  . Specimen Description 07/19/2014 URINE, CATHETERIZED   Final  . Special Requests 07/19/2014 NONE   Final  . Colony Count 07/19/2014    Final                   Value:NO GROWTH Performed at [facility]   . Culture 07/19/2014    Final                   Value:NO GROWTH Performed at [facility]   . Report Status 07/19/2014 07/22/2014 FINAL   Final  . TSH 07/20/2014 0.846  0.350 - 4.500 uIU/mL Final   Performed at [facility]  . Hgb A1c MFr Bld 07/20/2014 6.8* <5.7 % Final   Comment: (NOTE)  According to the ADA Clinical Practice Recommendations for 2011, when HbA1c is used as a screening test:  >=6.5%   Diagnostic of Diabetes Mellitus           (if abnormal result is confirmed) 5.7-6.4%   Increased risk of developing Diabetes Mellitus References:Diagnosis and Classification of Diabetes Mellitus,Diabetes ZWCH,8527,78(EUMPN 1):S62-S69 and Standards of Medical Care in         Diabetes - 2011,Diabetes TIRW,4315,40 (Suppl 1):S11-S61.   . Mean Plasma Glucose 07/20/2014 148* <117 mg/dL Final   Performed at [facility]  . WBC 07/20/2014 5.0  4.0 - 10.5 K/uL Final  . RBC 07/20/2014 2.88* 4.22 - 5.81 MIL/uL Final  . Hemoglobin 07/20/2014 9.6* 13.0 - 17.0 g/dL Final  . HCT 07/20/2014 29.3* 39.0 - 52.0 %  Final  . MCV 07/20/2014 101.7* 78.0 - 100.0 fL Final  . MCH 07/20/2014 33.3  26.0 - 34.0 pg Final  . MCHC 07/20/2014 32.8  30.0 - 36.0 g/dL Final  . RDW 07/20/2014 17.3* 11.5 - 15.5 % Final  . Platelets 07/20/2014 161  150 - 400 K/uL Final  . Sodium 07/20/2014 137  135 - 145 mmol/L Final   Please note change in reference range.  . Potassium 07/20/2014 4.0  3.5 - 5.1 mmol/L Final   Please note change in reference range.  . Chloride 07/20/2014 103  96 - 112 mEq/L Final  . CO2 07/20/2014 27  19 - 32 mmol/L Final  . Glucose, Bld 07/20/2014 122* 70 - 99 mg/dL Final  . BUN 07/20/2014 21  6 - 23 mg/dL Final  . Creatinine, Ser 07/20/2014 0.99  0.50 - 1.35 mg/dL Final  . Calcium 07/20/2014 8.8  8.4 - 10.5 mg/dL Final  . Total Protein 07/20/2014 6.1  6.0 - 8.3 g/dL Final  . Albumin 07/20/2014 3.1* 3.5 - 5.2 g/dL Final  . AST 07/20/2014 57* 0 - 37 U/L Final  . ALT 07/20/2014 40  0 - 53 U/L Final  . Alkaline Phosphatase 07/20/2014 240* 39 - 117 U/L Final  . Total Bilirubin 07/20/2014 0.9  0.3 - 1.2 mg/dL Final  . GFR calc non Af Amer 07/20/2014 70* >90 mL/min Final  . GFR calc Af Amer 07/20/2014 82* >90 mL/min Final   Comment: (NOTE) The eGFR has been calculated using the CKD EPI equation. This calculation has not been validated in all clinical situations. eGFR's persistently <90 mL/min signify possible Chronic Kidney Disease.   . Anion gap 07/20/2014 7  5 - 15 Final  . Glucose-Capillary 07/20/2014 128* 70 - 99 mg/dL Final  . Comment 1 07/20/2014 Notify RN   Final  . Glucose-Capillary 07/20/2014 131* 70 - 99 mg/dL Final  . Influenza A By PCR 07/20/2014 NEGATIVE  NEGATIVE Final  . Influenza B By PCR 07/20/2014 NEGATIVE  NEGATIVE Final  . H1N1 flu by pcr 07/20/2014 NOT DETECTED  NOT DETECTED Final   Comment:        The Xpert Flu assay (FDA approved for nasal aspirates or washes and nasopharyngeal swab specimens), is intended as an aid in the diagnosis of influenza and should not be used  as a sole basis for treatment. Performed at [facility]   . Glucose-Capillary 07/20/2014 130* 70 - 99 mg/dL Final  . WBC 07/21/2014 4.0  4.0 - 10.5 K/uL Final  . RBC 07/21/2014 3.01* 4.22 - 5.81 MIL/uL Final  . Hemoglobin 07/21/2014 10.3* 13.0 - 17.0 g/dL Final  . HCT 07/21/2014 30.6* 39.0 - 52.0 % Final  .  MCV 07/21/2014 101.7* 78.0 - 100.0 fL Final  . MCH 07/21/2014 34.2* 26.0 - 34.0 pg Final  . MCHC 07/21/2014 33.7  30.0 - 36.0 g/dL Final  . RDW 07/21/2014 17.1* 11.5 - 15.5 % Final  . Platelets 07/21/2014 179  150 - 400 K/uL Final  . Sodium 07/21/2014 137  135 - 145 mmol/L Final   Please note change in reference range.  . Potassium 07/21/2014 4.6  3.5 - 5.1 mmol/L Final   Please note change in reference range.  . Chloride 07/21/2014 103  96 - 112 mEq/L Final  . CO2 07/21/2014 28  19 - 32 mmol/L Final  . Glucose, Bld 07/21/2014 132* 70 - 99 mg/dL Final  . BUN 07/21/2014 21  6 - 23 mg/dL Final  . Creatinine, Ser 07/21/2014 1.06  0.50 - 1.35 mg/dL Final  . Calcium 07/21/2014 7.5* 8.4 - 10.5 mg/dL Final  . Total Protein 07/21/2014 6.2  6.0 - 8.3 g/dL Final  . Albumin 07/21/2014 3.0* 3.5 - 5.2 g/dL Final  . AST 07/21/2014 32  0 - 37 U/L Final  . ALT 07/21/2014 30  0 - 53 U/L Final  . Alkaline Phosphatase 07/21/2014 194* 39 - 117 U/L Final  . Total Bilirubin 07/21/2014 0.9  0.3 - 1.2 mg/dL Final  . GFR calc non Af Amer 07/21/2014 60* >90 mL/min Final  . GFR calc Af Amer 07/21/2014 70* >90 mL/min Final   Comment: (NOTE) The eGFR has been calculated using the CKD EPI equation. This calculation has not been validated in all clinical situations. eGFR's persistently <90 mL/min signify possible Chronic Kidney Disease.   . Anion gap 07/21/2014 6  5 - 15 Final  . Glucose-Capillary 07/20/2014 183* 70 - 99 mg/dL Final  . Comment 1 07/20/2014 Notify RN   Final  . Glucose-Capillary 07/20/2014 142* 70 - 99 mg/dL Final  . Comment 1 07/20/2014 Notify RN   Final  . Glucose-Capillary  07/21/2014 185* 70 - 99 mg/dL Final  . Glucose-Capillary 07/21/2014 138* 70 - 99 mg/dL Final  . WBC 07/22/2014 4.6  4.0 - 10.5 K/uL Final  . RBC 07/22/2014 3.09* 4.22 - 5.81 MIL/uL Final  . Hemoglobin 07/22/2014 10.5* 13.0 - 17.0 g/dL Final  . HCT 07/22/2014 31.3* 39.0 - 52.0 % Final  . MCV 07/22/2014 101.3* 78.0 - 100.0 fL Final  . MCH 07/22/2014 34.0  26.0 - 34.0 pg Final  . MCHC 07/22/2014 33.5  30.0 - 36.0 g/dL Final  . RDW 07/22/2014 17.1* 11.5 - 15.5 % Final  . Platelets 07/22/2014 190  150 - 400 K/uL Final  . Sodium 07/22/2014 133* 135 - 145 mmol/L Final   Please note change in reference range.  . Potassium 07/22/2014 3.8  3.5 - 5.1 mmol/L Final   Comment: Please note change in reference range. DELTA CHECK NOTED   . Chloride 07/22/2014 100  96 - 112 mEq/L Final  . CO2 07/22/2014 27  19 - 32 mmol/L Final  . Glucose, Bld 07/22/2014 113* 70 - 99 mg/dL Final  . BUN 07/22/2014 22  6 - 23 mg/dL Final  . Creatinine, Ser 07/22/2014 1.00  0.50 - 1.35 mg/dL Final  . Calcium 07/22/2014 8.9  8.4 - 10.5 mg/dL Final  . GFR calc non Af Amer 07/22/2014 64* >90 mL/min Final  . GFR calc Af Amer 07/22/2014 75* >90 mL/min Final   Comment: (NOTE) The eGFR has been calculated using the CKD EPI equation. This calculation has not been validated in all clinical situations.  eGFR's persistently <90 mL/min signify possible Chronic Kidney Disease.   . Anion gap 07/22/2014 6  5 - 15 Final  . Glucose-Capillary 07/21/2014 159* 70 - 99 mg/dL Final  . Vancomycin Tr 07/22/2014 15.1  10.0 - 20.0 ug/mL Final  . Glucose-Capillary 07/21/2014 160* 70 - 99 mg/dL Final  . Comment 1 07/21/2014 Notify RN   Final  . Glucose-Capillary 07/22/2014 115* 70 - 99 mg/dL Final  . Glucose-Capillary 07/22/2014 184* 70 - 99 mg/dL Final  . Glucose-Capillary 07/22/2014 130* 70 - 99 mg/dL Final  . Glucose-Capillary 07/22/2014 182* 70 - 99 mg/dL Final  . Comment 1 07/22/2014 Notify RN   Final  . Glucose-Capillary 07/23/2014  133* 70 - 99 mg/dL Final  . Glucose-Capillary 07/23/2014 177* 70 - 99 mg/dL Final  Lab on 07/16/2014  Component Date Value Ref Range Status  . Hemoglobin 07/11/2014 11.3* 13.5 - 17.5 g/dL Final  . HCT 07/11/2014 34* 41 - 53 % Final  . Platelets 07/11/2014 208  150 - 399 K/L Final  . WBC 07/11/2014 5.7   Final  . Glucose 07/11/2014 179   Final  . BUN 07/11/2014 24* 4 - 21 mg/dL Final  . Creatinine 07/11/2014 1.0  0.6 - 1.3 mg/dL Final  . Potassium 07/11/2014 4.2  3.4 - 5.3 mmol/L Final  . Sodium 07/11/2014 137  137 - 147 mmol/L Final  . Alkaline Phosphatase 07/11/2014 203* 25 - 125 U/L Final  . ALT 07/11/2014 26  10 - 40 U/L Final  . AST 07/11/2014 23  14 - 40 U/L Final  . Bilirubin, Total 07/11/2014 0.7   Final  Appointment on 07/11/2014  Component Date Value Ref Range Status  . WBC 07/11/2014 5.7  4.0 - 10.0 10e3/uL Final  . RBC 07/11/2014 3.33* 4.20 - 5.70 10e6/uL Final  . HGB 07/11/2014 11.3* 13.0 - 17.1 g/dL Final  . HCT 07/11/2014 33.9* 38.7 - 49.9 % Final  . MCV 07/11/2014 102* 82 - 98 fL Final  . MCH 07/11/2014 33.9* 28.0 - 33.4 pg Final  . MCHC 07/11/2014 33.3  32.0 - 35.9 g/dL Final  . RDW 07/11/2014 16.5* 11.1 - 15.7 % Final  . Platelets 07/11/2014 208  145 - 400 10e3/uL Final  . NEUT# 07/11/2014 3.8  1.5 - 6.5 10e3/uL Final  . LYMPH# 07/11/2014 1.4  0.9 - 3.3 10e3/uL Final  . MONO# 07/11/2014 0.4  0.1 - 0.9 10e3/uL Final  . Eosinophils Absolute 07/11/2014 0.1  0.0 - 0.5 10e3/uL Final  . BASO# 07/11/2014 0.0  0.0 - 0.2 10e3/uL Final  . NEUT% 07/11/2014 65.9  40.0 - 80.0 % Final  . LYMPH% 07/11/2014 25.3  14.0 - 48.0 % Final  . MONO% 07/11/2014 7.2  0.0 - 13.0 % Final  . EOS% 07/11/2014 1.1  0.0 - 7.0 % Final  . BASO% 07/11/2014 0.5  0.0 - 2.0 % Final  . Prealbumin 07/11/2014 17.7  17.0 - 34.0 mg/dL Final  . Sodium 07/11/2014 137  128 - 145 mEq/L Final  . Potassium 07/11/2014 4.2  3.3 - 4.7 mEq/L Final  . Chloride 07/11/2014 96* 98 - 108 mEq/L Final  . CO2  07/11/2014 29  18 - 33 mEq/L Final  . Glucose, Bld 07/11/2014 179* 73 - 118 mg/dL Final  . BUN, Bld 07/11/2014 24* 7 - 22 mg/dL Final  . Creat 07/11/2014 1.0  0.6 - 1.2 mg/dl Final  . Total Bilirubin 07/11/2014 0.70  0.20 - 1.60 mg/dl Final  . Alkaline Phosphatase 07/11/2014 203* 26 -  84 U/L Final  . AST 07/11/2014 23  11 - 38 U/L Final  . ALT(SGPT) 07/11/2014 26  10 - 47 U/L Final  . Total Protein 07/11/2014 7.4  6.4 - 8.1 g/dL Final  . Albumin 07/11/2014 3.3  3.3 - 5.5 g/dL Final  . Calcium 07/11/2014 9.5  8.0 - 10.3 mg/dL Final  Admission on 07/02/2014, Discharged on 07/06/2014  Component Date Value Ref Range Status  . WBC 07/02/2014 4.9  4.0 - 10.5 K/uL Final  . RBC 07/02/2014 3.13* 4.22 - 5.81 MIL/uL Final  . Hemoglobin 07/02/2014 10.2* 13.0 - 17.0 g/dL Final  . HCT 07/02/2014 31.0* 39.0 - 52.0 % Final  . MCV 07/02/2014 99.0  78.0 - 100.0 fL Final  . MCH 07/02/2014 32.6  26.0 - 34.0 pg Final  . MCHC 07/02/2014 32.9  30.0 - 36.0 g/dL Final  . RDW 07/02/2014 17.7* 11.5 - 15.5 % Final  . Platelets 07/02/2014 154  150 - 400 K/uL Final  . Neutrophils Relative % 07/02/2014 83* 43 - 77 % Final  . Neutro Abs 07/02/2014 4.0  1.7 - 7.7 K/uL Final  . Lymphocytes Relative 07/02/2014 10* 12 - 46 % Final  . Lymphs Abs 07/02/2014 0.5* 0.7 - 4.0 K/uL Final  . Monocytes Relative 07/02/2014 7  3 - 12 % Final  . Monocytes Absolute 07/02/2014 0.3  0.1 - 1.0 K/uL Final  . Eosinophils Relative 07/02/2014 0  0 - 5 % Final  . Eosinophils Absolute 07/02/2014 0.0  0.0 - 0.7 K/uL Final  . Basophils Relative 07/02/2014 0  0 - 1 % Final  . Basophils Absolute 07/02/2014 0.0  0.0 - 0.1 K/uL Final  . Sodium 07/02/2014 134* 137 - 147 mEq/L Final  . Potassium 07/02/2014 4.7  3.7 - 5.3 mEq/L Final  . Chloride 07/02/2014 97  96 - 112 mEq/L Final  . CO2 07/02/2014 23  19 - 32 mEq/L Final  . Glucose, Bld 07/02/2014 140* 70 - 99 mg/dL Final  . BUN 07/02/2014 23  6 - 23 mg/dL Final  . Creatinine, Ser 07/02/2014  0.90  0.50 - 1.35 mg/dL Final  . Calcium 07/02/2014 9.4  8.4 - 10.5 mg/dL Final  . Total Protein 07/02/2014 7.0  6.0 - 8.3 g/dL Final  . Albumin 07/02/2014 3.2* 3.5 - 5.2 g/dL Final  . AST 07/02/2014 187* 0 - 37 U/L Final  . ALT 07/02/2014 138* 0 - 53 U/L Final  . Alkaline Phosphatase 07/02/2014 413* 39 - 117 U/L Final  . Total Bilirubin 07/02/2014 0.7  0.3 - 1.2 mg/dL Final  . GFR calc non Af Amer 07/02/2014 73* >90 mL/min Final  . GFR calc Af Amer 07/02/2014 85* >90 mL/min Final   Comment: (NOTE) The eGFR has been calculated using the CKD EPI equation. This calculation has not been validated in all clinical situations. eGFR's persistently <90 mL/min signify possible Chronic Kidney Disease.   . Anion gap 07/02/2014 14  5 - 15 Final  . Specimen Description 07/02/2014 BLOOD BLOOD LEFT FOREARM   Final  . Special Requests 07/02/2014 BOTTLES DRAWN AEROBIC AND ANAEROBIC 5ML   Final  . Culture  Setup Time 07/02/2014    Final                   Value:07/02/2014 20:43 Performed at [facility]   . Culture 07/02/2014    Final                   Value:NO GROWTH 5  DAYS Performed at [facility]   . Report Status 07/02/2014 07/08/2014 FINAL   Final  . Specimen Description 07/02/2014 BLOOD BLOOD RIGHT FOREARM   Final  . Special Requests 07/02/2014 BOTTLES DRAWN AEROBIC AND ANAEROBIC 3ML   Final  . Culture  Setup Time 07/02/2014    Final                   Value:07/02/2014 20:43 Performed at [facility]   . Culture 07/02/2014    Final                   Value:NO GROWTH 5 DAYS Performed at [facility]   . Report Status 07/02/2014 07/08/2014 FINAL   Final  . Color, Urine 07/02/2014 AMBER* YELLOW Final   BIOCHEMICALS MAY BE AFFECTED BY COLOR  . APPearance 07/02/2014 CLEAR  CLEAR Final  . Specific Gravity, Urine 07/02/2014 1.020  1.005 - 1.030 Final  . pH 07/02/2014 5.0  5.0 - 8.0 Final  . Glucose, UA 07/02/2014 NEGATIVE  NEGATIVE mg/dL Final  . Hgb urine  dipstick 07/02/2014 MODERATE* NEGATIVE Final  . Bilirubin Urine 07/02/2014 NEGATIVE  NEGATIVE Final  . Ketones, ur 07/02/2014 NEGATIVE  NEGATIVE mg/dL Final  . Protein, ur 07/02/2014 NEGATIVE  NEGATIVE mg/dL Final  . Urobilinogen, UA 07/02/2014 1.0  0.0 - 1.0 mg/dL Final  . Nitrite 07/02/2014 NEGATIVE  NEGATIVE Final  . Leukocytes, UA 07/02/2014 NEGATIVE  NEGATIVE Final  . Specimen Description 07/02/2014 URINE, CLEAN CATCH   Final  . Special Requests 07/02/2014 NONE   Final  . Culture  Setup Time 07/02/2014    Final                   Value:07/02/2014 20:54 Performed at [facility]   . Colony Count 07/02/2014    Final                   Value:NO GROWTH Performed at [facility]   . Culture 07/02/2014    Final                   Value:NO GROWTH Performed at [facility]   . Report Status 07/02/2014 07/03/2014 FINAL   Final  . Lactic Acid, Venous 07/02/2014 1.66  0.5 - 2.2 mmol/L Final  . RBC / HPF 07/02/2014 3-6  <3 RBC/hpf Final  . Influenza A By PCR 07/02/2014 NEGATIVE  NEGATIVE Final  . Influenza B By PCR 07/02/2014 NEGATIVE  NEGATIVE Final  . H1N1 flu by pcr 07/02/2014 NOT DETECTED  NOT DETECTED Final   Comment:        The Xpert Flu assay (FDA approved for nasal aspirates or washes and nasopharyngeal swab specimens), is intended as an aid in the diagnosis of influenza and should not be used as a sole basis for treatment. Performed at [facility]   . Lipase 07/02/2014 7* 11 - 59 U/L Final  . Troponin I 07/02/2014 <0.30  <0.30 ng/mL Final   Comment:        Due to the release kinetics of cTnI, a negative result within the first hours of the onset of symptoms does not rule out myocardial infarction with certainty. If myocardial infarction is still suspected, repeat the test at appropriate intervals.   . Sodium 07/03/2014 137  137 - 147 mEq/L Final  . Potassium 07/03/2014 4.1  3.7 - 5.3 mEq/L Final  . Chloride 07/03/2014 101  96 - 112 mEq/L  Final  . CO2 07/03/2014 25  19 - 32 mEq/L Final  . Glucose, Bld 07/03/2014 126* 70 - 99 mg/dL Final  . BUN 07/03/2014 21  6 - 23 mg/dL Final  . Creatinine, Ser 07/03/2014 0.91  0.50 - 1.35 mg/dL Final  . Calcium 07/03/2014 9.2  8.4 - 10.5 mg/dL Final  . GFR calc non Af Amer 07/03/2014 73* >90 mL/min Final  . GFR calc Af Amer 07/03/2014 85* >90 mL/min Final   Comment: (NOTE) The eGFR has been calculated using the CKD EPI equation. This calculation has not been validated in all clinical situations. eGFR's persistently <90 mL/min signify possible Chronic Kidney Disease.   . Anion gap 07/03/2014 11  5 - 15 Final  . WBC 07/03/2014 4.2  4.0 - 10.5 K/uL Final  . RBC 07/03/2014 2.97* 4.22 - 5.81 MIL/uL Final  . Hemoglobin 07/03/2014 9.6* 13.0 - 17.0 g/dL Final  . HCT 07/03/2014 29.5* 39.0 - 52.0 % Final  . MCV 07/03/2014 99.3  78.0 - 100.0 fL Final  . MCH 07/03/2014 32.3  26.0 - 34.0 pg Final  . MCHC 07/03/2014 32.5  30.0 - 36.0 g/dL Final  . RDW 07/03/2014 17.7* 11.5 - 15.5 % Final  . Platelets 07/03/2014 138* 150 - 400 K/uL Final  . MRSA by PCR 07/02/2014 NEGATIVE  NEGATIVE Final   Comment:        The GeneXpert MRSA Assay (FDA approved for NASAL specimens only), is one component of a comprehensive MRSA colonization surveillance program. It is not intended to diagnose MRSA infection nor to guide or monitor treatment for MRSA infections.   . WBC 07/04/2014 4.9  4.0 - 10.5 K/uL Final   WHITE COUNT CONFIRMED ON SMEAR  . RBC 07/04/2014 3.09* 4.22 - 5.81 MIL/uL Final  . Hemoglobin 07/04/2014 10.1* 13.0 - 17.0 g/dL Final  . HCT 07/04/2014 30.8* 39.0 - 52.0 % Final  . MCV 07/04/2014 99.7  78.0 - 100.0 fL Final  . MCH 07/04/2014 32.7  26.0 - 34.0 pg Final  . MCHC 07/04/2014 32.8  30.0 - 36.0 g/dL Final  . RDW 07/04/2014 17.5* 11.5 - 15.5 % Final  . Platelets 07/04/2014 168  150 - 400 K/uL Final  . Sodium 07/04/2014 138  137 - 147 mEq/L Final  . Potassium 07/04/2014 4.2  3.7 - 5.3  mEq/L Final  . Chloride 07/04/2014 101  96 - 112 mEq/L Final  . CO2 07/04/2014 28  19 - 32 mEq/L Final  . Glucose, Bld 07/04/2014 148* 70 - 99 mg/dL Final  . BUN 07/04/2014 19  6 - 23 mg/dL Final  . Creatinine, Ser 07/04/2014 0.97  0.50 - 1.35 mg/dL Final  . Calcium 07/04/2014 9.2  8.4 - 10.5 mg/dL Final  . GFR calc non Af Amer 07/04/2014 71* >90 mL/min Final  . GFR calc Af Amer 07/04/2014 82* >90 mL/min Final   Comment: (NOTE) The eGFR has been calculated using the CKD EPI equation. This calculation has not been validated in all clinical situations. eGFR's persistently <90 mL/min signify possible Chronic Kidney Disease.   . Anion gap 07/04/2014 9  5 - 15 Final  . WBC 07/05/2014 4.5  4.0 - 10.5 K/uL Final  . RBC 07/05/2014 3.18* 4.22 - 5.81 MIL/uL Final  . Hemoglobin 07/05/2014 10.5* 13.0 - 17.0 g/dL Final  . HCT 07/05/2014 32.2* 39.0 - 52.0 % Final  . MCV 07/05/2014 101.3* 78.0 - 100.0 fL Final  . MCH 07/05/2014 33.0  26.0 - 34.0 pg Final  .  MCHC 07/05/2014 32.6  30.0 - 36.0 g/dL Final  . RDW 07/05/2014 17.3* 11.5 - 15.5 % Final  . Platelets 07/05/2014 203  150 - 400 K/uL Final  . Prealbumin 07/05/2014 12.1* 17.0 - 34.0 mg/dL Final   Performed at [facility]  . Sodium 07/05/2014 138  137 - 147 mEq/L Final  . Potassium 07/05/2014 4.0  3.7 - 5.3 mEq/L Final  . Chloride 07/05/2014 96  96 - 112 mEq/L Final  . CO2 07/05/2014 29  19 - 32 mEq/L Final  . Glucose, Bld 07/05/2014 137* 70 - 99 mg/dL Final  . BUN 07/05/2014 17  6 - 23 mg/dL Final  . Creatinine, Ser 07/05/2014 1.04  0.50 - 1.35 mg/dL Final  . Calcium 07/05/2014 9.7  8.4 - 10.5 mg/dL Final  . Total Protein 07/05/2014 6.9  6.0 - 8.3 g/dL Final  . Albumin 07/05/2014 3.0* 3.5 - 5.2 g/dL Final  . AST 07/05/2014 30  0 - 37 U/L Final  . ALT 07/05/2014 51  0 - 53 U/L Final  . Alkaline Phosphatase 07/05/2014 270* 39 - 117 U/L Final  . Total Bilirubin 07/05/2014 0.4  0.3 - 1.2 mg/dL Final  . GFR calc non Af Amer  07/05/2014 62* >90 mL/min Final  . GFR calc Af Amer 07/05/2014 71* >90 mL/min Final   Comment: (NOTE) The eGFR has been calculated using the CKD EPI equation. This calculation has not been validated in all clinical situations. eGFR's persistently <90 mL/min signify possible Chronic Kidney Disease.   . Anion gap 07/05/2014 13  5 - 15 Final  . WBC 07/06/2014 4.4  4.0 - 10.5 K/uL Final  . RBC 07/06/2014 3.17* 4.22 - 5.81 MIL/uL Final  . Hemoglobin 07/06/2014 10.1* 13.0 - 17.0 g/dL Final  . HCT 07/06/2014 31.6* 39.0 - 52.0 % Final  . MCV 07/06/2014 99.7  78.0 - 100.0 fL Final  . MCH 07/06/2014 31.9  26.0 - 34.0 pg Final  . MCHC 07/06/2014 32.0  30.0 - 36.0 g/dL Final  . RDW 07/06/2014 17.4* 11.5 - 15.5 % Final  . Platelets 07/06/2014 185  150 - 400 K/uL Final  . Sodium 07/06/2014 138  137 - 147 mEq/L Final  . Potassium 07/06/2014 4.2  3.7 - 5.3 mEq/L Final  . Chloride 07/06/2014 99  96 - 112 mEq/L Final  . CO2 07/06/2014 28  19 - 32 mEq/L Final  . Glucose, Bld 07/06/2014 138* 70 - 99 mg/dL Final  . BUN 07/06/2014 23  6 - 23 mg/dL Final  . Creatinine, Ser 07/06/2014 1.02  0.50 - 1.35 mg/dL Final  . Calcium 07/06/2014 9.7  8.4 - 10.5 mg/dL Final  . GFR calc non Af Amer 07/06/2014 63* >90 mL/min Final  . GFR calc Af Amer 07/06/2014 73* >90 mL/min Final   Comment: (NOTE) The eGFR has been calculated using the CKD EPI equation. This calculation has not been validated in all clinical situations. eGFR's persistently <90 mL/min signify possible Chronic Kidney Disease.   . Anion gap 07/06/2014 11  5 - 15 Final       Dg Chest 2 View  07/19/2014   CLINICAL DATA:  Fever  EXAM: CHEST  2 VIEW  COMPARISON:  07/04/2014  FINDINGS: There is mild cardiomegaly which is stable. Aortic and hilar contours are unchanged. Right IJ porta catheter, tip at the SVC level.  Streaky bibasilar lung opacities which favor atelectasis. No edema, effusion, or pneumothorax.  IMPRESSION: 1. Subsegmental bibasilar  atelectasis. 2. Cardiomegaly without failure.  Electronically Signed   By: Jorje Guild M.D.   On: 07/19/2014 20:55   Ct Abdomen Pelvis W Contrast  07/20/2014   CLINICAL DATA:  abd pain, hx of HTN, diabetes, bladder ca-2014 with chemo instillation, fever of 103 with unknown origion with hx of pancreatic ca  EXAM: CT ABDOMEN AND PELVIS WITH CONTRAST  TECHNIQUE: Multidetector CT imaging of the abdomen and pelvis was performed using the standard protocol following bolus administration of intravenous contrast.  CONTRAST:  122m OMNIPAQUE IOHEXOL 300 MG/ML  SOLN  COMPARISON:  For 06/04/2014  FINDINGS: Tiny bilateral pleural effusions. Coarse subsegmental atelectasis versus early airspace opacities in the posterior basal segments of both lower lobes. Coronary calcifications. Patent metallic biliary stent as evidenced by pneumobilia. There is a poorly marginated low-attenuation region in hepatic segment 7 measuring approximately 2.6 x 5 cm, previously 2.8 x 4.5 cm. There is a 2 cm segment 8 lesion without convincing interval change. There is a 14x 20 mm lesion in hepatic segment 4 image 23 which was less conspicuous on the prior study. Unremarkable spleen, adrenal glands, kidneys. There is fatty infiltration of the pancreas with a poorly marginated mass in the pancreatic body partially encasing the proximal SMA and SMV.  Stomach is physiologically distended. Small bowel and colon are nondilated. Innumerable sigmoid diverticula without significant adjacent inflammatory/ edematous change. Urinary bladder incompletely distended. Multiple metallic seeds around the prostate. No ascites. No free air. Aortoiliac atheromatous calcifications. Spondylitic changes throughout the lumbar spine.  IMPRESSION: 1. New tiny pleural effusions with adjacent atelectasis/ infiltrate. 2. Stable poorly marginated hepatic lesions suggesting metastatic disease. 3. Little change in pancreatic mass with patent metallic biliary stent and  pneumobilia.   Electronically Signed   By: DArne ClevelandM.D.   On: 07/20/2014 11:43     Assessment/Plan  1. Malignant neoplasm of tail of pancreas Patient has been taking Xeloda. He is scheduled to have another scan of the pancreas and liver in about 3-4 weeks.  2. Liver metastasis Follow-up scan scheduled at the end of February 2016  3. Weakness generalized Improving since he was readmitted to this facility on 07/23/14  4. Normocytic anemia Hemoglobin 10.0 on 08/21/14. MCV 97.6.  5. Anemia of chronic disease  6. SYSTOLIC HEART FAILURE, CHRONIC Despite a very low left ventricular ejection fraction measured in 2012, patient seems reasonably well compensated at present time.  7. HYPERTENSION, BENIGN Controlled  8. Hereditary and idiopathic peripheral neuropathy Benefits from Lyrica  9. Type 2 diabetes mellitus with diabetic chronic kidney disease Controlled. Patient is now on a small dose of insulin daily.  10. Pain of upper abdomen Related to his pancreatic and liver cancer. He describes the pain as mild and tolerable today.

## 2014-09-10 ENCOUNTER — Non-Acute Institutional Stay (SKILLED_NURSING_FACILITY): Payer: Medicare Other | Admitting: Nurse Practitioner

## 2014-09-10 ENCOUNTER — Encounter: Payer: Self-pay | Admitting: Nurse Practitioner

## 2014-09-10 DIAGNOSIS — G609 Hereditary and idiopathic neuropathy, unspecified: Secondary | ICD-10-CM | POA: Diagnosis not present

## 2014-09-10 DIAGNOSIS — I82409 Acute embolism and thrombosis of unspecified deep veins of unspecified lower extremity: Secondary | ICD-10-CM | POA: Diagnosis not present

## 2014-09-10 DIAGNOSIS — G4700 Insomnia, unspecified: Secondary | ICD-10-CM | POA: Diagnosis not present

## 2014-09-10 DIAGNOSIS — I10 Essential (primary) hypertension: Secondary | ICD-10-CM

## 2014-09-10 DIAGNOSIS — K219 Gastro-esophageal reflux disease without esophagitis: Secondary | ICD-10-CM | POA: Diagnosis not present

## 2014-09-10 DIAGNOSIS — I5022 Chronic systolic (congestive) heart failure: Secondary | ICD-10-CM | POA: Diagnosis not present

## 2014-09-10 DIAGNOSIS — E118 Type 2 diabetes mellitus with unspecified complications: Secondary | ICD-10-CM

## 2014-09-10 DIAGNOSIS — R1010 Upper abdominal pain, unspecified: Secondary | ICD-10-CM | POA: Diagnosis not present

## 2014-09-10 DIAGNOSIS — K5900 Constipation, unspecified: Secondary | ICD-10-CM | POA: Diagnosis not present

## 2014-09-10 NOTE — Assessment & Plan Note (Signed)
Adequately managed with MiraLax bid prn, Enulose 15mg  bid prn, Colace 100mg  bid, Bisacodyl 10mg  suppository qd prn.  08/08/14 loose stools-negative for C-diff Toxin, hold Laxative and may use Imodium prn. Observe.

## 2014-09-10 NOTE — Assessment & Plan Note (Signed)
12/20/13 Hgb A1c 7.3 04/09/14 CBG am 150-180, pm 186/235-adding Lantus 3 units qd qhs.  05/28/14 update Hgb A1c 7.1 06/27/14 continue Lantus 3 units daily.

## 2014-09-10 NOTE — Assessment & Plan Note (Signed)
Blood pressure controlled on Coreg 12.5mg  bid.

## 2014-09-10 NOTE — Assessment & Plan Note (Signed)
Compensated clinically.  

## 2014-09-10 NOTE — Assessment & Plan Note (Signed)
-   Continue Eliquis 

## 2014-09-10 NOTE — Assessment & Plan Note (Signed)
Takes ranitidine 150mg  bid. Stable.

## 2014-09-10 NOTE — Assessment & Plan Note (Signed)
Takes Pregabalin 75mg  daily. Ambulates with walker on unit

## 2014-09-10 NOTE — Assessment & Plan Note (Signed)
Due to pancreatic cancer. Takes Oxycodone bid for pain control.

## 2014-09-10 NOTE — Assessment & Plan Note (Addendum)
C/o sleeps too much, change Temazepam 15mg  qhs to prn and Lorazepam 0.5mg  qhs for sleep. The patient stated still cannot sleep at night-improved since 08/13/14 Mirtazapine 7.5mg  po qhs. Observe.

## 2014-09-10 NOTE — Progress Notes (Signed)
Patient ID: [name] [name], male   DOB: 1923-07-20, 81 y.o.   MRN: [id number]   Code Status: Full code  No Known Allergies  Chief Complaint  Patient presents with  . Medical Management of Chronic Issues    HPI: Patient is a 81 y.o. male seen in the SNF at [facility] today for evaluation of chronic medical conditions.    Hospitalized 07/19/2014-07/23/2014 for Type 2 diabetes mellitus HYPERTENSION, BENIGN Pancreatic cancer Fever. He was recently hospitalized for similar issues with fever and was discharged on December 18, on Levaquin. He lives in a skilled nursing facility. He presented with a fever of 103F. He has a history of metastatic pancreatic cancer.   Hospitalized 07/02/14-07/06/14 for fever 104-resolved once ABT started. Negative urine culture, CXR, and blood culture.     Hospitalized 03/29/2014 -04/04/2014 for pancreatic mass, FTT, CHF. He was admitted 03/29/14 with chief complaint of malaise, decreased appetite, generalized weakness and hematuria. A CT scan done on admission showed a 4.9 cm enhancing lesion adjacent to the pancreatic head/uncinate process worrisome for a primary pancreatic adenocarcinoma and suspected multifocal hepatic metastasis. This mass was noted back in June when his urologist ordered a CT scan of his abdomen/pelvis, and he was referred to GI as an outpatient for further evaluation. An EUS with biopsy was planned however the patient did not hold his Coumadin as instructed and the biopsy was canceled. The mass was not detectable on the ultrasound. Further evaluation was subsequently halted given the patient's overall functional status and comorbidities. His wife died 3 weeks ago.    Problem List Items Addressed This Visit    Type 2 diabetes mellitus (Chronic)    12/20/13 Hgb A1c 7.3 04/09/14 CBG am 150-180, pm 186/235-adding Lantus 3 units qd qhs.  05/28/14 update Hgb A1c 7.1 06/27/14 continue Lantus 3 units daily.      SYSTOLIC HEART FAILURE, CHRONIC  (Chronic)    Compensated clinically.         Insomnia - Primary    C/o sleeps too much, change Temazepam 28m qhs to prn and Lorazepam 0.5667mqhs for sleep. The patient stated still cannot sleep at night-improved since 08/13/14 Mirtazapine 7.67m20mo qhs. Observe.        HYPERTENSION, BENIGN (Chronic)    Blood pressure controlled on Coreg 12.67mg62md.       Hereditary and idiopathic peripheral neuropathy (Chronic)    Takes Pregabalin 767mg80mly. Ambulates with walker on unit      GERD    Takes ranitidine 150mg 83m Stable.        DVT (deep venous thrombosis) (Chronic)    Continue Eliquis.       Constipation    Adequately managed with MiraLax bid prn, Enulose 167mg b567mrn, Colace 100mg bi50misacodyl 10mg sup22mtory qd prn.  08/08/14 loose stools-negative for C-diff Toxin, hold Laxative and may use Imodium prn. Observe.       Abdominal pain (Chronic)    Due to pancreatic cancer. Takes Oxycodone bid for pain control.            Review of Systems:  Review of Systems  Constitutional: Negative for fever, chills, weight loss, malaise/fatigue and diaphoresis.  HENT: Positive for hearing loss. Negative for congestion, ear discharge, ear pain, nosebleeds, sore throat and tinnitus.   Eyes: Negative for blurred vision, double vision, photophobia, pain, discharge and redness.  Respiratory: Negative for cough, hemoptysis, sputum production, shortness of breath, wheezing and stridor.   Cardiovascular: Positive for leg swelling. Negative  for chest pain, palpitations, orthopnea, claudication and PND.       Trace ankles.   Gastrointestinal: Negative for heartburn, nausea, vomiting, abdominal pain, diarrhea, constipation, blood in stool and melena.  Genitourinary: Positive for frequency. Negative for dysuria, urgency, hematuria and flank pain.  Musculoskeletal: Negative for myalgias, back pain, joint pain, falls and neck pain.  Skin: Negative for itching and rash.  Neurological:  Negative for dizziness, tingling, tremors, sensory change, speech change, focal weakness, seizures, loss of consciousness, weakness and headaches.  Endo/Heme/Allergies: Negative for environmental allergies and polydipsia. Does not bruise/bleed easily.  Psychiatric/Behavioral: Negative for depression, suicidal ideas, hallucinations, memory loss and substance abuse. The patient is not nervous/anxious and does not have insomnia.      Past Medical History  Diagnosis Date  . Diverticulosis of colon (without mention of hemorrhage)   . Irritable bowel syndrome   . Personal history of malignant neoplasm of prostate     s/p  radioactive seed implants 2000  . Unspecified hereditary and idiopathic peripheral neuropathy   . Nonischemic cardiomyopathy cardiologist-  dr Aundra Dubin    EF 25%  per last echo 2012 and cardiologist note--  2007-  ef  40-45%;  2008- ef 35-40%;  2010- ef 30-35%;  2012- ef 25%  . Sinus bradycardia   . Hypertension   . Arthritis     hands  . Hyperlipidemia   . History of DVT of lower extremity     2002  ,   left leg  . History of adenomatous polyp of colon     2001  . History of basal cell carcinoma excision   . Anticoagulated on Coumadin   . Type 2 diabetes, diet controlled   . GERD (gastroesophageal reflux disease)   . LBBB (left bundle branch block)   . Wears glasses   . Wears hearing aid     BILATERAL  . Unsteady gait   . DDD (degenerative disc disease)     cervical and lumbar  . DJD (degenerative joint disease)     hips  . Lower urinary tract symptoms (LUTS)   . 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 Coronary artery disease     per cath 2002  non-obstructive cad  . Chronic systolic heart failure   . H/O fracture of nose     12-30-2013-  pt fell at home open nose bone fx  . Abdominal pain 12/20/2013  . Abnormal MRI of abdomen 01/31/2014  . Acute encephalopathy 07/02/2014    . Acute thromboembolism of deep veins of lower extremity 05/03/2007    Qualifier: History of  By: Doy Mince LPN, Megan  16/10/96 Doppler LLE: DVT at left femoral and popliteal veins not completely occlusive. Will anticoagulate with Eliquis 2.72m bid.    . Anemia of chronic disease 02/20/2013  . CARDIOMYOPATHY 07/06/2007    Qualifier: Diagnosis of  By: NLenna GilfordMD, SDeborra Medina  . Constipation 04/04/2014  . Coronary atherosclerosis 07/06/2007    Qualifier: Diagnosis of  By: NLenna GilfordMD, [place]ern Prairie[place]EASE 05/03/2007    Qualifier: Diagnosis of  By: RDoy MinceLPN, Megan    . DVT (deep venous thrombosis)   . Edema 05/28/2014  . Failure to thrive in adult 03/30/2014  . Fall in elderly patient 12/30/2013  . Fever 07/20/2014  . Frailty 03/30/2014  . FUO (fever of unknown origin) 05/28/2014    05/28/14 CXR  right lower lobe bronchopneumonia.    Marland Kitchen GERD 05/03/2007    Qualifier: Diagnosis of  By: Doy Mince LPN, Megan    . Hereditary and idiopathic peripheral neuropathy 07/06/2007    Qualifier: Diagnosis of  By: Lenna Gilford MD, Deborra Medina   . HYPERLIPIDEMIA 05/03/2007    Qualifier: Diagnosis of  By: Doy Mince LPN, Megan    . HYPERTENSION, BENIGN 06/23/2010    Qualifier: Diagnosis of  By: Olevia Perches, MD, Glenetta Hew   . Insomnia 04/04/2014  . Liver metastasis 04/01/2014  . Malnutrition of moderate degree 03/30/2014    05/01/14 albumin 2.8 05/29/14 albumin 3.0   . Metastatic cancer 03/29/2014  . Normocytic anemia 03/30/2014    04/10/14 Hgb 9.3   . Pancreatic cancer 04/06/2014    06/06/14 Oncology: stage IV pancreatic cancer with Mets to liver: CA 19-9 400--194. CT showed more liver metastases. Xeloda 1581m bid x 14days on and 7 days off. 3 cycles of tx and then repeat the scans.    . Pressure ulcer stage II 08/15/2014    08/14/14 L scrotum small PU-not infected-tx initiated.    . Protein-calorie malnutrition, severe 07/05/2014  . SPINAL STENOSIS, LUMBAR 07/06/2007    Qualifier: Diagnosis of  By: NLenna GilfordMD,  SDeborra Medina  . Streptococcal bacteremia 07/23/2014  . Transaminitis 03/30/2014    04/10/14 Alkaline phosphatase 126(39-117) 05/01/14 AST 33, ALT 45, alk  Phos 75 05/29/14 AST 64, ALT 70 alk phos 209   . Type 2 diabetes mellitus 05/03/2007    Qualifier: Diagnosis of  By: RDoy MinceLPN, Megan  102/77/41Hgb A1c 7.1    . Weakness generalized 04/02/2014   Past Surgical History  Procedure Laterality Date  . Knee arthroscopy Left 1991  . Ercp with sphincterotomy and stone removal  05/ 1993  &  05-11-2006  . Total knee arthroplasty  10/05/2011    Procedure: TOTAL KNEE ARTHROPLASTY;  Surgeon: FGearlean Alf MD;  Location: [facility];  Service: Orthopedics;  Laterality: Right;  . Transurethral resection of bladder tumor N/A 09/30/2012    Procedure: TRANSURETHRAL RESECTION OF BLADDER TUMOR (TURBT);  Surgeon: SFranchot Gallo MD;  Location: [facility];  Service: Urology;  Laterality: N/A;  WITH MITOMYCIN INSTILLATION   . Eus N/A 01/04/2014    Procedure: ESOPHAGEAL ENDOSCOPIC ULTRASOUND (EUS) RADIAL;  Surgeon: DMilus Banister MD;  Location: [facility];  Service: Endoscopy;  Laterality: N/A;  . Radioactive prostate seed implants  01/ 2000  . Mohs surgery  03/ 2009    LEFT EAR BASAL CELL CARCINOMA  . Tonsillectomy  AS CHILD  . Cholecystectomy open  1985  . Appendectomy  child  . Inguinal hernia repair Bilateral 1998    recurrent RIH  w/ repair 06-21-2006  . Total knee arthroplasty Left 02-21-2007    AND 04-21-2007  CLOSED MANIPULATION  . Cataract extraction w/ intraocular lens implant Right 2004  . Lumbar spine surgery  X2  . Cardiac catheterization  06-14-2001  dr bDarnell Levelbrodie    non-obstructive CAD/  LM 30%,  pLAD 50%,  mLAD 30%,  RCA  &  CFX  irregularites ,  global hypokinesis,  ef 40%  . Cardiovascular stress test  02-07-2007   dr bDarnell Levelbrodie    negative for ischemia,  low risk myoview perfusion study  . Transthoracic echocardiogram  01-28-2011    moderate LVH/  ef 25%/  diffuse hypokinesis/  mild LAE/   mild TR/  trivial AR & MR  . Cystoscopy w/ retrogrades Bilateral 03/09/2014    Procedure:  CYSTOSCOPY WITH BILATERAL RETROGRADE PYELOGRAM BLADDER BX AND RENAL WASHINGS.;  Surgeon: Jorja Loa, MD;  Location: [facility];  Service: Urology;  Laterality: Bilateral;  . Eus N/A 04/05/2014    Procedure: UPPER ENDOSCOPIC ULTRASOUND (EUS) LINEAR;  Surgeon: [name], MD;  Location: [facility];  Service: Endoscopy;  Laterality: N/A;  radial linear  . Endoscopic retrograde cholangiopancreatography (ercp) with propofol N/A 04/12/2014    Procedure: ENDOSCOPIC RETROGRADE CHOLANGIOPANCREATOGRAPHY (ERCP) WITH PROPOFOL;  Surgeon: [name], MD;  Location: [facility];  Service: Endoscopy;  Laterality: N/A;  metal stent  . Biliary stent placement N/A 04/12/2014    Procedure: BILIARY STENT PLACEMENT;  Surgeon: [name], MD;  Location: [facility];  Service: Endoscopy;  Laterality: N/A;   Social History:   reports that he quit smoking about 32 years ago. His smoking use included Cigarettes. He started smoking about 60 years ago. He has a 10 pack-year smoking history. He has never used smokeless tobacco. He reports that he does not drink alcohol or use illicit drugs.  Family History  Problem Relation Age of Onset  . Throat cancer Brother   . Coronary artery disease Father     deceased    Medications: Patient's Medications  New Prescriptions   No medications on file  Previous Medications   APIXABAN (ELIQUIS) 5 MG TABS TABLET    Take 1 tablet (5 mg total) by mouth 2 (two) times daily.   BISACODYL (DULCOLAX) 10 MG SUPPOSITORY    Place 1 suppository (10 mg total) rectally daily as needed for moderate constipation.   CAPECITABINE (XELODA) 500 MG TABLET    Take 3 tablets (1,500 mg total) by mouth 2 (two) times daily after a meal. TAKES 3 TABS (1500) MG BID  ///  ON 14 DAYS OFF 7 DAYS FOR 3 CYCLES.   STARTED 06/06/14   CARVEDILOL (COREG) 12.5 MG TABLET    Take 12.5 mg by  mouth 2 (two) times daily with a meal.   DOCUSATE SODIUM 100 MG CAPS    Take 100 mg by mouth 2 (two) times daily.   FEEDING SUPPLEMENT, GLUCERNA SHAKE, (GLUCERNA SHAKE) LIQD    Take 237 mLs by mouth 2 (two) times daily between meals as needed (If pt with <50% meal completion).   HYDROCODONE-ACETAMINOPHEN (NORCO) 10-325 MG PER TABLET    Take one tablet by mouth every 4 hours as needed for pain   INSULIN GLARGINE (LANTUS) 100 UNIT/ML INJECTION    Inject 3 Units into the skin at bedtime.   LACTULOSE (CHRONULAC) 10 GM/15ML SOLUTION    Take by mouth daily as needed for mild constipation.   LIDOCAINE-PRILOCAINE (EMLA) CREAM    Apply 1 application topically as needed.   LORAZEPAM (ATIVAN) 0.5 MG TABLET    Take 2 tablets (1 mg total) by mouth every 6 (six) hours as needed (nausea/vomiting).   MIRTAZAPINE (REMERON) 7.5 MG TABLET    Take 7.5 mg by mouth at bedtime.   MULTIPLE VITAMIN (MULTIVITAMIN WITH MINERALS) TABS TABLET    Take 1 tablet by mouth daily.   NITROGLYCERIN (NITROSTAT) 0.4 MG SL TABLET    Place 1 tablet (0.4 mg total) under the tongue every 5 (five) minutes as needed for chest pain.   OMEGA-3 FATTY ACIDS (FISH OIL) 1000 MG CAPS    Take 1,000 mg by mouth every morning.    ONDANSETRON (ZOFRAN) 8 MG TABLET    Take by mouth 2 (two) times daily as needed for nausea or vomiting.  OXYCODONE (OXY IR/ROXICODONE) 5 MG IMMEDIATE RELEASE TABLET    Take 1 tablet (5 mg total) by mouth 2 (two) times daily.   POLYETHYLENE GLYCOL (MIRALAX / GLYCOLAX) PACKET    Take 17 g by mouth 2 (two) times daily as needed for mild constipation.   POLYVINYL ALCOHOL (LIQUIFILM TEARS) 1.4 % OPHTHALMIC SOLUTION    Place 1 drop into both eyes 4 (four) times daily.   PRAVASTATIN (PRAVACHOL) 40 MG TABLET    Take 40 mg by mouth every evening.    PREGABALIN (LYRICA) 75 MG CAPSULE    Take 75 mg by mouth every morning.    PROCHLORPERAZINE (COMPAZINE) 5 MG TABLET    Take 5 mg by mouth every 6 (six) hours as needed for nausea or  vomiting.   RANITIDINE (ZANTAC) 150 MG TABLET    Take 150 mg by mouth 2 (two) times daily.   SACCHAROMYCES BOULARDII (FLORASTOR) 250 MG CAPSULE    Take 250 mg by mouth 2 (two) times daily.   TEMAZEPAM (RESTORIL) 15 MG CAPSULE    Take 15 mg by mouth at bedtime.   Modified Medications   No medications on file  Discontinued Medications   No medications on file     Physical Exam: Physical Exam  Constitutional: He is oriented to person, place, and time. He appears well-developed and well-nourished.  HENT:  Head: Normocephalic and atraumatic.  Mouth/Throat: Oropharynx is clear and moist.  Eyes: Conjunctivae and EOM are normal. Pupils are equal, round, and reactive to light. Right eye exhibits no discharge. Left eye exhibits no discharge. No scleral icterus.  Neck: Normal range of motion. Neck supple. No JVD present. No tracheal deviation present. No thyromegaly present.  Cardiovascular: Normal rate, regular rhythm and normal heart sounds.   Pulmonary/Chest: Effort normal and breath sounds normal. No stridor. He has no wheezes. He has no rales. He exhibits no tenderness.  Abdominal: Soft. Bowel sounds are normal. He exhibits no distension. There is no tenderness. There is no rebound and no guarding.  Minimal epigastric tenderness.  No ascites  Musculoskeletal: Normal range of motion. He exhibits edema. He exhibits no tenderness.  LLE  Lymphadenopathy:    He has no cervical adenopathy.  Neurological: He is alert and oriented to person, place, and time. No cranial nerve deficit.  Skin: Skin is warm and dry. No rash noted. No erythema. No pallor.  Intergluteal cleft redness.   Psychiatric: He has a normal mood and affect. His behavior is normal.    Filed Vitals:   09/10/14 1554  BP: 142/70  Pulse: 78  Temp: 99.2 F (37.3 C)  TempSrc: Tympanic  Resp: 18      Labs reviewed: Basic Metabolic Panel:  Recent Labs  05/29/14  07/20/14 0500  07/22/14 0500 08/01/14 1317 08/21/14  08/23/14 1309  NA 136*  < > 137  < > 133* 139 139 138  K 4.3  < > 4.0  < > 3.8 4.6 3.9 4.5  CL  --   < > 103  < > 100 98  --  97*  CO2  --   < > 27  < > 27 29  --  26  GLUCOSE  --   < > 122*  < > 113* 198*  --  157*  BUN 22*  < > 21  < > 22 23* 25* 25*  CREATININE 1.0  < > 0.99  < > 1.00 1.1 1.0 1.0  CALCIUM  --   < > 8.8  < >  8.9 9.9  --  9.3  TSH 1.15  --  0.846  --   --   --   --   --   < > = values in this interval not displayed. Liver Function Tests:  Recent Labs  07/19/14 2023 07/20/14 0500 07/21/14 0530 08/01/14 1317 08/23/14 1309  AST 76* 57* 32 115* 97*  ALT 48 40 30 66* 83*  ALKPHOS 278* 240* 194* 294* 300*  BILITOT 0.6 0.9 0.9 0.80 0.90  PROT 6.7 6.1 6.2 7.6 7.0  ALBUMIN 3.4* 3.1* 3.0*  --   --     Recent Labs  12/19/13 1514 03/29/14 1610 07/02/14 1845  LIPASE 8.0* 10* 7*   No results for input(s): AMMONIA in the last 8760 hours. CBC:  Recent Labs  07/19/14 2023  07/22/14 0500 08/01/14 1317 08/21/14 08/23/14 1309  WBC 5.7  < > 4.6 6.4 5.1 4.8  NEUTROABS 4.5  --   --  4.4  --  3.2  HGB 10.0*  < > 10.5* 11.3* 10.0* 10.9*  HCT 29.9*  < > 31.3* 34.2* 28* 32.4*  MCV 100.7*  < > 101.3* 104*  --  103*  PLT 168  < > 190 181 148* 169  < > = values in this interval not displayed. Lipid Panel: No results for input(s): CHOL, HDL, LDLCALC, TRIG, CHOLHDL, LDLDIRECT in the last 8760 hours.  Past Procedures:  03/29/14 CT abd and pelvis w CM:  IMPRESSION:  4.9 cm enhancing lesion adjacent to the pancreatic head/uncinate  process, worrisome for primary pancreatic adenocarcinoma,  progressed.  Tumor likely involves the SMA/SMV and undersurface of the portal  vein. 9 mm short axis node in the porta hepatitis.  Suspected multifocal hepatic metastases, including a 3.2 cm lesion  in the right hepatic dome.   03/30/14 MR w wo CM abd:   IMPRESSION:  1. Subtle amorphous mass in the pancreatic head and proximal  pancreatic body currently measuring 4.4 x 3.2 cm,  causing distal  common bile duct obstruction and demonstrating vascular involvement  (SMA, SMV, splenic vein, splenic portal confluence and proximal  portal vein), as above, highly concerning for primary pancreatic  neoplasm.  2. Although there do appear to be multiple ill-defined hepatic  lesions, concerning for metastatic disease, these were poorly  evaluated on today's examination secondary to patient respiratory  motion. Continued attention on any future followup studies is  recommended, as metastatic disease to the liver is suspected.  3. Severe intra and extrahepatic biliary ductal dilatation.   05/04/14 Doppler LLE: DVT at left femoral and popliteal veins not completely occlusive. Will anticoagulate with Eliquis 2.60m bid.    07/20/14 CT abd and pelvis:    IMPRESSION: 1. New tiny pleural effusions with adjacent atelectasis/ infiltrate. 2. Stable poorly marginated hepatic lesions suggesting metastatic disease. 3. Little change in pancreatic mass with patent metallic biliary stent and pneumobilia.   Assessment/Plan Insomnia C/o sleeps too much, change Temazepam 164mqhs to prn and Lorazepam 0.22m43mhs for sleep. The patient stated still cannot sleep at night-improved since 08/13/14 Mirtazapine 7.22mg9m qhs. Observe.     Type 2 diabetes mellitus 12/20/13 Hgb A1c 7.3 04/09/14 CBG am 150-180, pm 186/235-adding Lantus 3 units qd qhs.  05/28/14 update Hgb A1c 7.1 06/27/14 continue Lantus 3 units daily.   Hereditary and idiopathic peripheral neuropathy Takes Pregabalin 722mg20mly. Ambulates with walker on unit   HYPERTENSION, BENIGN Blood pressure controlled on Coreg 12.22mg b22m    GERD Takes ranitidine 150mg b16m  Stable.     Constipation Adequately managed with MiraLax bid prn, Enulose 72m bid prn, Colace 1038mbid, Bisacodyl 1068muppository qd prn.  08/08/14 loose stools-negative for C-diff Toxin, hold Laxative and may use Imodium prn. Observe.    DVT (deep venous  thrombosis) Continue Eliquis.    Abdominal pain Due to pancreatic cancer. Takes Oxycodone bid for pain control.      SYSTOLIC HEART FAILURE, CHRONIC Compensated clinically.        Family/ Staff Communication: observe the patient.   Goals of Care: SNF  Labs/tests ordered: none

## 2014-09-11 ENCOUNTER — Other Ambulatory Visit: Payer: Self-pay | Admitting: *Deleted

## 2014-09-11 MED ORDER — OXYCODONE HCL 5 MG PO TABS: ORAL_TABLET | ORAL | Status: AC

## 2014-09-11 NOTE — Telephone Encounter (Signed)
[facility] 

## 2014-09-17 ENCOUNTER — Encounter: Payer: Self-pay | Admitting: Hematology & Oncology

## 2014-09-17 ENCOUNTER — Encounter (HOSPITAL_BASED_OUTPATIENT_CLINIC_OR_DEPARTMENT_OTHER): Payer: Self-pay

## 2014-09-17 ENCOUNTER — Ambulatory Visit (HOSPITAL_BASED_OUTPATIENT_CLINIC_OR_DEPARTMENT_OTHER): Payer: Medicare Other | Admitting: Hematology & Oncology

## 2014-09-17 ENCOUNTER — Ambulatory Visit (HOSPITAL_BASED_OUTPATIENT_CLINIC_OR_DEPARTMENT_OTHER)
Admission: RE | Admit: 2014-09-17 | Discharge: 2014-09-17 | Disposition: A | Discharge status: Home / Self Care | Payer: Medicare Other | Source: Ambulatory Visit | Attending: Hematology & Oncology | Admitting: Hematology & Oncology

## 2014-09-17 ENCOUNTER — Other Ambulatory Visit (HOSPITAL_BASED_OUTPATIENT_CLINIC_OR_DEPARTMENT_OTHER): Payer: Medicare Other | Admitting: Lab

## 2014-09-17 VITALS — BP 112/50 | HR 56 | Temp 98.0°F | Resp 18 | Ht 69.0 in | Wt 168.0 lb

## 2014-09-17 DIAGNOSIS — C251 Malignant neoplasm of body of pancreas: Secondary | ICD-10-CM

## 2014-09-17 DIAGNOSIS — C250 Malignant neoplasm of head of pancreas: Secondary | ICD-10-CM

## 2014-09-17 DIAGNOSIS — C787 Secondary malignant neoplasm of liver and intrahepatic bile duct: Secondary | ICD-10-CM | POA: Diagnosis not present

## 2014-09-17 DIAGNOSIS — C252 Malignant neoplasm of tail of pancreas: Secondary | ICD-10-CM | POA: Insufficient documentation

## 2014-09-17 DIAGNOSIS — E119 Type 2 diabetes mellitus without complications: Secondary | ICD-10-CM | POA: Diagnosis not present

## 2014-09-17 DIAGNOSIS — C61 Malignant neoplasm of prostate: Secondary | ICD-10-CM | POA: Diagnosis not present

## 2014-09-17 LAB — CBC WITH DIFFERENTIAL (CANCER CENTER ONLY)
BASO#: 0 10*3/uL (ref 0.0–0.2)
BASO%: 0.5 % (ref 0.0–2.0)
EOS ABS: 0.1 10*3/uL (ref 0.0–0.5)
EOS%: 1.5 % (ref 0.0–7.0)
HCT: 31.5 % — ABNORMAL LOW (ref 38.7–49.9)
HGB: 10.6 g/dL — ABNORMAL LOW (ref 13.0–17.1)
LYMPH#: 1.4 10*3/uL (ref 0.9–3.3)
LYMPH%: 22.9 % (ref 14.0–48.0)
MCH: 35.2 pg — ABNORMAL HIGH (ref 28.0–33.4)
MCHC: 33.7 g/dL (ref 32.0–35.9)
MCV: 105 fL — ABNORMAL HIGH (ref 82–98)
MONO#: 0.5 10*3/uL (ref 0.1–0.9)
MONO%: 8.2 % (ref 0.0–13.0)
NEUT%: 66.9 % (ref 40.0–80.0)
NEUTROS ABS: 4.1 10*3/uL (ref 1.5–6.5)
PLATELETS: 215 10*3/uL (ref 145–400)
RBC: 3.01 10*6/uL — ABNORMAL LOW (ref 4.20–5.70)
RDW: 16.2 % — ABNORMAL HIGH (ref 11.1–15.7)
WBC: 6.1 10*3/uL (ref 4.0–10.0)

## 2014-09-17 LAB — CMP (CANCER CENTER ONLY)
ALT: 45 U/L (ref 10–47)
AST: 49 U/L — AB (ref 11–38)
Albumin: 3.3 g/dL (ref 3.3–5.5)
Alkaline Phosphatase: 261 U/L — ABNORMAL HIGH (ref 26–84)
BUN: 18 mg/dL (ref 7–22)
CO2: 27 mEq/L (ref 18–33)
CREATININE: 1.1 mg/dL (ref 0.6–1.2)
Calcium: 9.3 mg/dL (ref 8.0–10.3)
Chloride: 96 mEq/L — ABNORMAL LOW (ref 98–108)
Glucose, Bld: 135 mg/dL — ABNORMAL HIGH (ref 73–118)
Potassium: 4.5 mEq/L (ref 3.3–4.7)
Sodium: 139 mEq/L (ref 128–145)
Total Bilirubin: 1.1 mg/dl (ref 0.20–1.60)
Total Protein: 7.6 g/dL (ref 6.4–8.1)

## 2014-09-17 LAB — HEPATIC FUNCTION PANEL
ALT: 45 U/L — AB (ref 10–40)
AST: 49 U/L — AB (ref 14–40)
Alkaline Phosphatase: 261 U/L — AB (ref 25–125)
Bilirubin, Total: 1.1 mg/dL

## 2014-09-17 LAB — BASIC METABOLIC PANEL
BUN: 18 mg/dL (ref 4–21)
CREATININE: 1.1 mg/dL (ref 0.6–1.3)
GLUCOSE: 135 mg/dL
Potassium: 4.5 mmol/L (ref 3.4–5.3)
Sodium: 139 mmol/L (ref 137–147)

## 2014-09-17 LAB — CANCER ANTIGEN 19-9: CA 19-9: 71.8 U/mL — ABNORMAL HIGH (ref <35.0)

## 2014-09-17 LAB — CBC AND DIFFERENTIAL
HCT: 32 % — AB (ref 41–53)
Hemoglobin: 10.6 g/dL — AB (ref 13.5–17.5)
PLATELETS: 215 10*3/uL (ref 150–399)
WBC: 6.1 10*3/mL

## 2014-09-17 LAB — PREALBUMIN: PREALBUMIN: 10.6 mg/dL — AB (ref 17.0–34.0)

## 2014-09-17 MED ORDER — IOHEXOL 300 MG/ML  SOLN
100.0000 mL | Freq: Once | INTRAMUSCULAR | Status: AC | PRN
  Administered 2014-09-17: 100 mL via INTRAVENOUS

## 2014-09-17 NOTE — Progress Notes (Signed)
Hematology and Oncology Follow Up Visit  [name] [name] [id number] 04-18-1923 81 y.o. 09/17/2014   Principle Diagnosis:  Stage IV pancreatic cancer with mets to liver. Bilateral lower extremity DVT  Current Therapy:    Xeloda s/p 5 cycles  ELIQUIS 2.5 mg by mouth twice a day     Interim History:  Mr.  [name] [name] is back for follow-up. Looks quite good. He's been doing pretty well at the nursing home.  We went ahead and got a CT scan of his abdomen and pelvis today. Thankfully, there is been a very nice response. The liver metastases can no longer be seen.  He's done very well with the Xeloda. He's had no nausea and vomiting with this. His appetite seems to be doing pretty well. Having palate has some fevers at the nursing home. He has a biliary stenting which I think is probably the source for the fevers that he has. I think if these continue, he may need to have the biliary stent replaced.  He's had no diarrhea. He's had no bleeding. If he is on ELIQUIS. He's had no issues with the ELIQUIS. He's not had any problems with leg swelling. He does use compression stockings. His last CA-19-9 was down to 122.  His performance status is ECOG 2.  Medications:  Current outpatient prescriptions:  .  apixaban (ELIQUIS) 5 MG TABS tablet, Take 1 tablet (5 mg total) by mouth 2 (two) times daily., Disp: , Rfl:  .  bisacodyl (DULCOLAX) 10 MG suppository, Place 1 suppository (10 mg total) rectally daily as needed for moderate constipation., Disp: 12 suppository, Rfl: 0 .  capecitabine (XELODA) 500 MG tablet, Take 3 tablets (1,500 mg total) by mouth 2 (two) times daily after a meal. TAKES 3 TABS (1500) MG BID  ///  ON 14 DAYS OFF 7 DAYS FOR 3 CYCLES.   STARTED 06/06/14, Disp: , Rfl:  .  carvedilol (COREG) 12.5 MG tablet, Take 12.5 mg by mouth 2 (two) times daily with a meal., Disp: , Rfl:  .  docusate sodium 100 MG CAPS, Take 100 mg by mouth 2 (two) times daily., Disp: 60 capsule, Rfl: 0 .  feeding  supplement, GLUCERNA SHAKE, (GLUCERNA SHAKE) LIQD, Take 237 mLs by mouth 2 (two) times daily between meals as needed (If pt with <50% meal completion)., Disp: , Rfl: 0 .  HYDROcodone-acetaminophen (NORCO) 10-325 MG per tablet, Take one tablet by mouth every 4 hours as needed for pain, Disp: 20 tablet, Rfl: 0 .  insulin glargine (LANTUS) 100 UNIT/ML injection, Inject 3 Units into the skin at bedtime., Disp: , Rfl:  .  lactulose (CHRONULAC) 10 GM/15ML solution, Take by mouth daily as needed for mild constipation., Disp: , Rfl:  .  lidocaine-prilocaine (EMLA) cream, Apply 1 application topically as needed. (Patient taking differently: Apply 1 application topically as needed (port access). ), Disp: 30 g, Rfl: 0 .  LORazepam (ATIVAN) 0.5 MG tablet, Take 2 tablets (1 mg total) by mouth every 6 (six) hours as needed (nausea/vomiting). (Patient taking differently: Take 0.5-1 mg by mouth See admin instructions. ), Disp: 15 tablet, Rfl: 0 .  mirtazapine (REMERON) 7.5 MG tablet, Take 7.5 mg by mouth at bedtime., Disp: , Rfl:  .  Multiple Vitamin (MULTIVITAMIN WITH MINERALS) TABS tablet, Take 1 tablet by mouth daily., Disp: , Rfl:  .  nitroGLYCERIN (NITROSTAT) 0.4 MG SL tablet, Place 1 tablet (0.4 mg total) under the tongue every 5 (five) minutes as needed for chest pain., Disp: 90 tablet,  Rfl: 3 .  Omega-3 Fatty Acids (FISH OIL) 1000 MG CAPS, Take 1,000 mg by mouth every morning. , Disp: , Rfl:  .  ondansetron (ZOFRAN) 8 MG tablet, Take by mouth 2 (two) times daily as needed for nausea or vomiting. , Disp: , Rfl:  .  oxyCODONE (OXY IR/ROXICODONE) 5 MG immediate release tablet, Take one tablet by mouth twice daily for pain, Disp: 60 tablet, Rfl: 0 .  polyethylene glycol (MIRALAX / GLYCOLAX) packet, Take 17 g by mouth 2 (two) times daily as needed for mild constipation., Disp: , Rfl:  .  polyvinyl alcohol (LIQUIFILM TEARS) 1.4 % ophthalmic solution, Place 1 drop into both eyes 4 (four) times daily., Disp: , Rfl:  .   pravastatin (PRAVACHOL) 40 MG tablet, Take 40 mg by mouth every evening. , Disp: , Rfl:  .  pregabalin (LYRICA) 75 MG capsule, Take 75 mg by mouth every morning. , Disp: , Rfl:  .  prochlorperazine (COMPAZINE) 5 MG tablet, Take 5 mg by mouth every 6 (six) hours as needed for nausea or vomiting., Disp: , Rfl:  .  ranitidine (ZANTAC) 150 MG tablet, Take 150 mg by mouth 2 (two) times daily., Disp: , Rfl:  .  saccharomyces boulardii (FLORASTOR) 250 MG capsule, Take 250 mg by mouth 2 (two) times daily., Disp: , Rfl:  .  temazepam (RESTORIL) 15 MG capsule, Take 15 mg by mouth at bedtime. , Disp: , Rfl:  .  [DISCONTINUED] Calcium Carbonate-Vitamin D (CALTRATE 600+D) 600-400 MG-UNIT per tablet, Take 1 tablet by mouth daily. , Disp: , Rfl:   Allergies: No Known Allergies  Past Medical History, Surgical history, Social history, and Family History were reviewed and updated.  Review of Systems: As above  Physical Exam:  height is 5\' 9"  (1.753 m) and weight is 168 lb (76.204 kg). His oral temperature is 98 F (36.7 C). His blood pressure is 112/50 and his pulse is 56. His respiration is 18.   Elderly white gentleman in no obvious distress. Head and neck exam shows no ocular or oral lesions. He has no palpable cervical or supraclavicular lymph nodes. There might be some slight temporal muscle wasting. Lungs are clear. Cardiac exam regular rate and rhythm with no murmurs, rubs or bruits. Abdomen is soft. There is no fluid wave. There is no palpable liver or spleen tip. He has no palpable abdominal mass. Back exam shows no tenderness over the spine, ribs or hips. Extremities shows some trace edema in his lower legs. He has some osteoarthritic changes in his joints. Skin exam shows no rashes, ecchymoses or petechia. He does have some skin breakdown in the lower sacral area. Neurological exam is nonfocal.  Lab Results  Component Value Date   WBC 6.1 09/17/2014   HGB 10.6* 09/17/2014   HCT 31.5* 09/17/2014    MCV 105* 09/17/2014   PLT 215 09/17/2014     Chemistry      Component Value Date/Time   NA 139 09/17/2014 1259   NA 139 08/21/2014   NA 133* 07/22/2014 0500   K 4.5 09/17/2014 1259   K 3.9 08/21/2014   CL 96* 09/17/2014 1259   CL 100 07/22/2014 0500   CO2 27 09/17/2014 1259   CO2 27 07/22/2014 0500   BUN 18 09/17/2014 1259   BUN 25* 08/21/2014   BUN 22 07/22/2014 0500   CREATININE 1.1 09/17/2014 1259   CREATININE 1.0 08/21/2014   CREATININE 1.00 07/22/2014 0500   GLU 127 08/21/2014  Component Value Date/Time   CALCIUM 9.3 09/17/2014 1259   CALCIUM 8.9 07/22/2014 0500   ALKPHOS 261* 09/17/2014 1259   ALKPHOS 194* 07/21/2014 0530   AST 49* 09/17/2014 1259   AST 32 07/21/2014 0530   ALT 45 09/17/2014 1259   ALT 30 07/21/2014 0530   BILITOT 1.10 09/17/2014 1259   BILITOT 0.9 07/21/2014 0530         Impression and Plan: Mr. Castagna is 81 year old gentleman. He has metastatic pancreatic cancer. He is on Xeloda and tolerating this okay. I his CAT scan shows that he is responding. He looks better. Overall, I'm just very happy that he's done this well.  We will continue him on the Xeloda.  I don't think we are to do any scans on him probably for about 3 months.  His CA-19-9I think, is a good way for [place] to assess for response.  I spent about 25 minutes with he and his companion. I went over the scans with them.  We will have him come back in 3 weeks.  Volanda Napoleon, MD 2/29/20162:32 PM

## 2014-09-19 ENCOUNTER — Other Ambulatory Visit: Payer: Self-pay | Admitting: Nurse Practitioner

## 2014-09-19 DIAGNOSIS — E871 Hypo-osmolality and hyponatremia: Secondary | ICD-10-CM

## 2014-09-19 DIAGNOSIS — E876 Hypokalemia: Secondary | ICD-10-CM

## 2014-09-19 DIAGNOSIS — C259 Malignant neoplasm of pancreas, unspecified: Secondary | ICD-10-CM

## 2014-09-19 DIAGNOSIS — D649 Anemia, unspecified: Secondary | ICD-10-CM

## 2014-09-29 LAB — BASIC METABOLIC PANEL
BUN: 19 mg/dL (ref 4–21)
Creatinine: 0.9 mg/dL (ref 0.6–1.3)
Glucose: 114 mg/dL
Potassium: 3.8 mmol/L (ref 3.4–5.3)
SODIUM: 137 mmol/L (ref 137–147)

## 2014-09-29 LAB — CBC AND DIFFERENTIAL
HCT: 27 % — AB (ref 41–53)
Hemoglobin: 9.6 g/dL — AB (ref 13.5–17.5)
Platelets: 18 10*3/uL — AB (ref 150–399)
WBC: 6.1 10^3/mL

## 2014-09-29 LAB — HEPATIC FUNCTION PANEL
ALK PHOS: 141 U/L — AB (ref 25–125)
ALT: 8 U/L — AB (ref 10–40)
AST: 12 U/L — AB (ref 14–40)
Bilirubin, Total: 1.1 mg/dL

## 2014-10-01 ENCOUNTER — Non-Acute Institutional Stay (SKILLED_NURSING_FACILITY): Payer: Medicare Other | Admitting: Nurse Practitioner

## 2014-10-01 ENCOUNTER — Encounter: Payer: Self-pay | Admitting: Nurse Practitioner

## 2014-10-01 DIAGNOSIS — E876 Hypokalemia: Secondary | ICD-10-CM

## 2014-10-01 DIAGNOSIS — L03032 Cellulitis of left toe: Secondary | ICD-10-CM | POA: Diagnosis not present

## 2014-10-01 DIAGNOSIS — L03031 Cellulitis of right toe: Secondary | ICD-10-CM | POA: Insufficient documentation

## 2014-10-01 DIAGNOSIS — D649 Anemia, unspecified: Secondary | ICD-10-CM

## 2014-10-01 DIAGNOSIS — C250 Malignant neoplasm of head of pancreas: Secondary | ICD-10-CM | POA: Diagnosis not present

## 2014-10-01 DIAGNOSIS — G4700 Insomnia, unspecified: Secondary | ICD-10-CM | POA: Diagnosis not present

## 2014-10-01 DIAGNOSIS — I5022 Chronic systolic (congestive) heart failure: Secondary | ICD-10-CM | POA: Diagnosis not present

## 2014-10-01 DIAGNOSIS — R509 Fever, unspecified: Secondary | ICD-10-CM | POA: Diagnosis not present

## 2014-10-01 DIAGNOSIS — E871 Hypo-osmolality and hyponatremia: Secondary | ICD-10-CM

## 2014-10-01 DIAGNOSIS — E1140 Type 2 diabetes mellitus with diabetic neuropathy, unspecified: Secondary | ICD-10-CM | POA: Diagnosis not present

## 2014-10-01 NOTE — Assessment & Plan Note (Signed)
Left great toe nail came off-left great toe erythematous and mild swelling-Levaquin 750mg  daily x 7 days-Bactroban oint daily x 10 days. Observe.

## 2014-10-01 NOTE — Assessment & Plan Note (Signed)
12/20/13 Hgb A1c 7.3 04/09/14 CBG am 150-180, pm 186/235-adding Lantus 3 units qd qhs.  05/28/14 update Hgb A1c 7.1 06/27/14 continue Lantus 3 units daily.

## 2014-10-01 NOTE — Assessment & Plan Note (Signed)
Patient was known to have a history of pancreatic cancer with liver metastases. Patient was seen in consultation by Dr. Marin Olp for oncology and it was recommended that patient resume his dose of xeloda. Patient will follow-up with oncology as outpatient.

## 2014-10-01 NOTE — Progress Notes (Signed)
Patient ID: [name] [name], male   DOB: 1923-03-15, 81 y.o.   MRN: [id number]   Code Status: DNR  No Known Allergies  Chief Complaint  Patient presents with  . Medical Management of Chronic Issues  . Acute Visit    fever and lethargy, left great toe nail came off/red swollen     HPI: Patient is a 81 y.o. male seen in the SNF at [facility] today for evaluation of  and other chronic medical conditions.  Problem List Items Addressed This Visit    Type 2 diabetes mellitus (Chronic)    12/20/13 Hgb A1c 7.3 04/09/14 CBG am 150-180, pm 186/235-adding Lantus 3 units qd qhs.  05/28/14 update Hgb A1c 7.1 06/27/14 continue Lantus 3 units daily.       SYSTOLIC HEART FAILURE, CHRONIC (Chronic)    Compensated clinically.       Pancreatic cancer (Chronic)    Patient was known to have a history of pancreatic cancer with liver metastases. Patient was seen in consultation by Dr. Marin Olp for oncology and it was recommended that patient resume his dose of xeloda. Patient will follow-up with oncology as outpatient.       Normocytic anemia (Chronic)    04/10/14 Hgb 9.3 09/17/14 Hgb 10.6 09/29/14 Hg 9.6       Insomnia    C/o sleeps too much, change Temazepam 71m qhs to prn and Lorazepam 0.519mqhs for sleep. The patient stated still cannot sleep at night-improved since 08/13/14 Mirtazapine 7.59m46mo qhs. Observe.        Hyponatremia    wnl      Hypokalemia    wnl      Fever   Cellulitis of great toe of left foot - Primary    Left great toe nail came off-left great toe erythematous and mild swelling-Levaquin 750m31mily x 7 days-Bactroban oint daily x 10 days. Observe.           Review of Systems:  Review of Systems  Constitutional: Positive for fever, activity change, appetite change and fatigue. Negative for chills and diaphoresis.  HENT: Positive for hearing loss. Negative for congestion, ear discharge, ear pain, nosebleeds, sore throat and tinnitus.   Eyes: Negative for  photophobia, pain, discharge and redness.  Respiratory: Negative for cough, shortness of breath, wheezing and stridor.   Cardiovascular: Positive for leg swelling. Negative for chest pain and palpitations.       Trace ankles.   Gastrointestinal: Negative for nausea, vomiting, abdominal pain, diarrhea, constipation and blood in stool.  Endocrine: Negative for polydipsia.  Genitourinary: Positive for frequency. Negative for dysuria, urgency, hematuria and flank pain.  Musculoskeletal: Negative for myalgias, back pain and neck pain.  Skin: Negative for rash.       Left great toe nail came off-erythematous and swelling the left great toe R upper chest Pot A Cath intact.   Allergic/Immunologic: Negative for environmental allergies.  Neurological: Negative for dizziness, tremors, seizures, weakness and headaches.  Hematological: Does not bruise/bleed easily.  Psychiatric/Behavioral: Negative for suicidal ideas and hallucinations. The patient is not nervous/anxious.      Past Medical History  Diagnosis Date  . Diverticulosis of colon (without mention of hemorrhage)   . Irritable bowel syndrome   . Personal history of malignant neoplasm of prostate     s/p  radioactive seed implants 2000  . Unspecified hereditary and idiopathic peripheral neuropathy   . Nonischemic cardiomyopathy cardiologist-  dr mcleAundra DubinEF 25%  per last  echo 2012 and cardiologist note--  2007-  ef  40-45%;  2008- ef 35-40%;  2010- ef 30-35%;  2012- ef 25%  . Sinus bradycardia   . Hypertension   . Arthritis     hands  . Hyperlipidemia   . History of DVT of lower extremity     2002  ,   left leg  . History of adenomatous polyp of colon     2001  . History of basal cell carcinoma excision   . Anticoagulated on Coumadin   . Type 2 diabetes, diet controlled   . GERD (gastroesophageal reflux disease)   . LBBB (left bundle branch block)   . Wears glasses   . Wears hearing aid     BILATERAL  . Unsteady gait   . DDD  (degenerative disc disease)     cervical and lumbar  . DJD (degenerative joint disease)     hips  . Lower urinary tract symptoms (LUTS)   . 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 Coronary artery disease     per cath 2002  non-obstructive cad  . Chronic systolic heart failure   . H/O fracture of nose     12-30-2013-  pt fell at home open nose bone fx  . Abdominal pain 12/20/2013  . Abnormal MRI of abdomen 01/31/2014  . Acute encephalopathy 07/02/2014  . Acute thromboembolism of deep veins of lower extremity 05/03/2007    Qualifier: History of  By: Doy Mince LPN, Megan  40/98/11 Doppler LLE: DVT at left femoral and popliteal veins not completely occlusive. Will anticoagulate with Eliquis 2.40m bid.    . Anemia of chronic disease 02/20/2013  . CARDIOMYOPATHY 07/06/2007    Qualifier: Diagnosis of  By: NLenna GilfordMD, SDeborra Medina  . Constipation 04/04/2014  . Coronary atherosclerosis 07/06/2007    Qualifier: Diagnosis of  By: NLenna GilfordMD, SBereaDISEASE 05/03/2007    Qualifier: Diagnosis of  By: RDoy MinceLPN, Megan    . DVT (deep venous thrombosis)   . Edema 05/28/2014  . Failure to thrive in adult 03/30/2014  . Fall in elderly patient 12/30/2013  . Fever 07/20/2014  . Frailty 03/30/2014  . FUO (fever of unknown origin) 05/28/2014    05/28/14 CXR right lower lobe bronchopneumonia.    .Marland KitchenGERD 05/03/2007    Qualifier: Diagnosis of  By: RDoy MinceLPN, Megan    . Hereditary and idiopathic peripheral neuropathy 07/06/2007    Qualifier: Diagnosis of  By: NLenna GilfordMD, SDeborra Medina  . HYPERLIPIDEMIA 05/03/2007    Qualifier: Diagnosis of  By: RDoy MinceLPN, Megan    . HYPERTENSION, BENIGN 06/23/2010    Qualifier: Diagnosis of  By: BOlevia Perches MD, FGlenetta Hew  . Insomnia 04/04/2014  . Liver metastasis 04/01/2014  . Malnutrition of moderate degree 03/30/2014    05/01/14 albumin 2.8 05/29/14 albumin 3.0   .  Metastatic cancer 03/29/2014  . Normocytic anemia 03/30/2014    04/10/14 Hgb 9.3   . Pancreatic cancer 04/06/2014    06/06/14 Oncology: stage IV pancreatic cancer with Mets to liver: CA 19-9 400--194. CT showed more liver metastases. Xeloda 15024mbid x 14days on and 7 days off. 3 cycles of tx and then repeat the scans.    . Pressure ulcer stage II 08/15/2014    08/14/14 L scrotum small PU-not infected-tx initiated.    . Protein-calorie malnutrition,  severe 07/05/2014  . SPINAL STENOSIS, LUMBAR 07/06/2007    Qualifier: Diagnosis of  By: Lenna Gilford MD, Deborra Medina   . Streptococcal bacteremia 07/23/2014  . Transaminitis 03/30/2014    04/10/14 Alkaline phosphatase 126(39-117) 05/01/14 AST 33, ALT 45, alk  Phos 75 05/29/14 AST 64, ALT 70 alk phos 209   . Type 2 diabetes mellitus 05/03/2007    Qualifier: Diagnosis of  By: Doy Mince LPN, Megan  32/35/57 Hgb A1c 7.1    . Weakness generalized 04/02/2014   Past Surgical History  Procedure Laterality Date  . Knee arthroscopy Left 1991  . Ercp with sphincterotomy and stone removal  05/ 1993  &  05-11-2006  . Total knee arthroplasty  10/05/2011    Procedure: TOTAL KNEE ARTHROPLASTY;  Surgeon: Gearlean Alf, MD;  Location: [facility];  Service: Orthopedics;  Laterality: Right;  . Transurethral resection of bladder tumor N/A 09/30/2012    Procedure: TRANSURETHRAL RESECTION OF BLADDER TUMOR (TURBT);  Surgeon: Franchot Gallo, MD;  Location: [facility];  Service: Urology;  Laterality: N/A;  WITH MITOMYCIN INSTILLATION   . Eus N/A 01/04/2014    Procedure: ESOPHAGEAL ENDOSCOPIC ULTRASOUND (EUS) RADIAL;  Surgeon: [name], MD;  Location: [facility];  Service: Endoscopy;  Laterality: N/A;  . Radioactive prostate seed implants  01/ 2000  . Mohs surgery  03/ 2009    LEFT EAR BASAL CELL CARCINOMA  . Tonsillectomy  AS CHILD  . Cholecystectomy open  1985  . Appendectomy  child  . Inguinal hernia repair Bilateral 1998    recurrent RIH  w/ repair 06-21-2006  . Total knee  arthroplasty Left 02-21-2007    AND 04-21-2007  CLOSED MANIPULATION  . Cataract extraction w/ intraocular lens implant Right 2004  . Lumbar spine surgery  X2  . Cardiac catheterization  06-14-2001  dr Darnell Level brodie    non-obstructive CAD/  LM 30%,  pLAD 50%,  mLAD 30%,  RCA  &  CFX  irregularites ,  global hypokinesis,  ef 40%  . Cardiovascular stress test  02-07-2007   dr Darnell Level brodie    negative for ischemia,  low risk myoview perfusion study  . Transthoracic echocardiogram  01-28-2011    moderate LVH/  ef 25%/  diffuse hypokinesis/  mild LAE/  mild TR/  trivial AR & MR  . Cystoscopy w/ retrogrades Bilateral 03/09/2014    Procedure: CYSTOSCOPY WITH BILATERAL RETROGRADE PYELOGRAM BLADDER BX AND RENAL WASHINGS.;  Surgeon: Jorja Loa, MD;  Location: [facility];  Service: Urology;  Laterality: Bilateral;  . Eus N/A 04/05/2014    Procedure: UPPER ENDOSCOPIC ULTRASOUND (EUS) LINEAR;  Surgeon: [name], MD;  Location: [facility];  Service: Endoscopy;  Laterality: N/A;  radial linear  . Endoscopic retrograde cholangiopancreatography (ercp) with propofol N/A 04/12/2014    Procedure: ENDOSCOPIC RETROGRADE CHOLANGIOPANCREATOGRAPHY (ERCP) WITH PROPOFOL;  Surgeon: [name], MD;  Location: [facility];  Service: Endoscopy;  Laterality: N/A;  metal stent  . Biliary stent placement N/A 04/12/2014    Procedure: BILIARY STENT PLACEMENT;  Surgeon: [name], MD;  Location: [facility];  Service: Endoscopy;  Laterality: N/A;   Social History:   reports that he quit smoking about 32 years ago. His smoking use included Cigarettes. He started smoking about 60 years ago. He has a 10 pack-year smoking history. He has never used smokeless tobacco. He reports that he does not drink alcohol or use illicit drugs.  Family History  Problem Relation Age of Onset  . Throat  cancer Brother   . Coronary artery disease Father     deceased    Medications: Patient's Medications    New Prescriptions   No medications on file  Previous Medications   APIXABAN (ELIQUIS) 5 MG TABS TABLET    Take 1 tablet (5 mg total) by mouth 2 (two) times daily.   BISACODYL (DULCOLAX) 10 MG SUPPOSITORY    Place 1 suppository (10 mg total) rectally daily as needed for moderate constipation.   CAPECITABINE (XELODA) 500 MG TABLET    Take 3 tablets (1,500 mg total) by mouth 2 (two) times daily after a meal. TAKES 3 TABS (1500) MG BID  ///  ON 14 DAYS OFF 7 DAYS FOR 3 CYCLES.   STARTED 06/06/14   CARVEDILOL (COREG) 12.5 MG TABLET    Take 12.5 mg by mouth 2 (two) times daily with a meal.   DOCUSATE SODIUM 100 MG CAPS    Take 100 mg by mouth 2 (two) times daily.   FEEDING SUPPLEMENT, GLUCERNA SHAKE, (GLUCERNA SHAKE) LIQD    Take 237 mLs by mouth 2 (two) times daily between meals as needed (If pt with <50% meal completion).   HYDROCODONE-ACETAMINOPHEN (NORCO) 10-325 MG PER TABLET    Take one tablet by mouth every 4 hours as needed for pain   INSULIN GLARGINE (LANTUS) 100 UNIT/ML INJECTION    Inject 3 Units into the skin at bedtime.   LACTULOSE (CHRONULAC) 10 GM/15ML SOLUTION    Take by mouth daily as needed for mild constipation.   LIDOCAINE-PRILOCAINE (EMLA) CREAM    Apply 1 application topically as needed.   LORAZEPAM (ATIVAN) 0.5 MG TABLET    Take 2 tablets (1 mg total) by mouth every 6 (six) hours as needed (nausea/vomiting).   MIRTAZAPINE (REMERON) 7.5 MG TABLET    Take 7.5 mg by mouth at bedtime.   MULTIPLE VITAMIN (MULTIVITAMIN WITH MINERALS) TABS TABLET    Take 1 tablet by mouth daily.   NITROGLYCERIN (NITROSTAT) 0.4 MG SL TABLET    Place 1 tablet (0.4 mg total) under the tongue every 5 (five) minutes as needed for chest pain.   OMEGA-3 FATTY ACIDS (FISH OIL) 1000 MG CAPS    Take 1,000 mg by mouth every morning.    ONDANSETRON (ZOFRAN) 8 MG TABLET    Take by mouth 2 (two) times daily as needed for nausea or vomiting.    OXYCODONE (OXY IR/ROXICODONE) 5 MG IMMEDIATE RELEASE TABLET    Take one  tablet by mouth twice daily for pain   POLYETHYLENE GLYCOL (MIRALAX / GLYCOLAX) PACKET    Take 17 g by mouth 2 (two) times daily as needed for mild constipation.   POLYVINYL ALCOHOL (LIQUIFILM TEARS) 1.4 % OPHTHALMIC SOLUTION    Place 1 drop into both eyes 4 (four) times daily.   PRAVASTATIN (PRAVACHOL) 40 MG TABLET    Take 40 mg by mouth every evening.    PREGABALIN (LYRICA) 75 MG CAPSULE    Take 75 mg by mouth every morning.    PROCHLORPERAZINE (COMPAZINE) 5 MG TABLET    Take 5 mg by mouth every 6 (six) hours as needed for nausea or vomiting.   RANITIDINE (ZANTAC) 150 MG TABLET    Take 150 mg by mouth 2 (two) times daily.   SACCHAROMYCES BOULARDII (FLORASTOR) 250 MG CAPSULE    Take 250 mg by mouth 2 (two) times daily.   TEMAZEPAM (RESTORIL) 15 MG CAPSULE    Take 15 mg by mouth at bedtime.   Modified Medications  No medications on file  Discontinued Medications   No medications on file     Physical Exam: Physical Exam  Constitutional: He is oriented to person, place, and time. He appears well-developed and well-nourished.  HENT:  Head: Normocephalic and atraumatic.  Mouth/Throat: Oropharynx is clear and moist.  Eyes: Conjunctivae and EOM are normal. Pupils are equal, round, and reactive to light. Right eye exhibits no discharge. Left eye exhibits no discharge. No scleral icterus.  Neck: Normal range of motion. Neck supple. No JVD present. No tracheal deviation present. No thyromegaly present.  Cardiovascular: Normal rate, regular rhythm and normal heart sounds.   Pulmonary/Chest: Effort normal and breath sounds normal. No stridor. He has no wheezes. He has no rales. He exhibits no tenderness.  Abdominal: Soft. Bowel sounds are normal. He exhibits no distension. There is no tenderness. There is no rebound and no guarding.  Minimal epigastric tenderness.  No ascites  Musculoskeletal: Normal range of motion. He exhibits edema. He exhibits no tenderness.  LLE  Lymphadenopathy:    He has  no cervical adenopathy.  Neurological: He is alert and oriented to person, place, and time. No cranial nerve deficit.  Skin: Skin is warm and dry. No rash noted. No erythema. No pallor.  Left great toe nail came off-erythematous and swelling the left great toe   Psychiatric: He has a normal mood and affect. His behavior is normal.    Filed Vitals:   10/01/14 1150  BP: 146/80  Pulse: 80  Temp: 104.9 F (40.5 C)  TempSrc: Tympanic  Resp: 22      Labs reviewed: Basic Metabolic Panel:  Recent Labs  05/29/14  07/20/14 0500  08/01/14 1317  08/23/14 1309 09/17/14 09/17/14 1259 09/29/14  NA 136*  < > 137  < > 139  < > 138 139 139 137  K 4.3  < > 4.0  < > 4.6  < > 4.5 4.5 4.5 3.8  CL  --   < > 103  < > 98  --  97*  --  96*  --   CO2  --   < > 27  < > 29  --  26  --  27  --   GLUCOSE  --   < > 122*  < > 198*  --  157*  --  135*  --   BUN 22*  < > 21  < > 23*  < > 25* _0 CREATININE 1.0  < > 0.99  < > 1.1  < > 1.0 1.1 1.1 0.9  CALCIUM  --   < > 8.8  < > 9.9  --  9.3  --  9.3  --   TSH 1.15  --  0.846  --   --   --   --   --   --   --   < > = values in this interval not displayed. Liver Function Tests:  Recent Labs  07/19/14 2023 07/20/14 0500 07/21/14 0530 08/01/14 1317 08/23/14 1309 09/17/14 09/17/14 1259 09/29/14  AST 76* 57* 32 115* 97* 49* 49* 12*  ALT 48 40 30 66* 83* 45* 45 8*  ALKPHOS 278* 240* 194* 294* 300* 261* 261* 141*  BILITOT 0.6 0.9 0.9 0.80 0.90  --  1.10  --   PROT 6.7 6.1 6.2 7.6 7.0  --  7.6  --   ALBUMIN 3.4* 3.1* 3.0*  --   --   --   --   --  Recent Labs  12/19/13 1514 03/29/14 1610 07/02/14 1845  LIPASE 8.0* 10* 7*   No results for input(s): AMMONIA in the last 8760 hours. CBC:  Recent Labs  08/01/14 1317  08/23/14 1309 09/17/14 09/17/14 1259 09/29/14  WBC 6.4  < > 4.8 6.1 6.1 6.1  NEUTROABS 4.4  --  3.2  --  4.1  --   HGB 11.3*  < > 10.9* 10.6* 10.6* 9.6*  HCT 34.2*  < > 32.4* 32* 31.5* 27*  MCV 104*  --  103*  --  105*   --   PLT 181  < > 169 215 215 18*  < > = values in this interval not displayed. Lipid Panel: No results for input(s): CHOL, HDL, LDLCALC, TRIG, CHOLHDL, LDLDIRECT in the last 8760 hours.  Past Procedures:  03/29/14 CT abd and pelvis w CM:  IMPRESSION:  4.9 cm enhancing lesion adjacent to the pancreatic head/uncinate  process, worrisome for primary pancreatic adenocarcinoma,  progressed.  Tumor likely involves the SMA/SMV and undersurface of the portal  vein. 9 mm short axis node in the porta hepatitis.  Suspected multifocal hepatic metastases, including a 3.2 cm lesion  in the right hepatic dome.   03/30/14 MR w wo CM abd:   IMPRESSION:  1. Subtle amorphous mass in the pancreatic head and proximal  pancreatic body currently measuring 4.4 x 3.2 cm, causing distal  common bile duct obstruction and demonstrating vascular involvement  (SMA, SMV, splenic vein, splenic portal confluence and proximal  portal vein), as above, highly concerning for primary pancreatic  neoplasm.  2. Although there do appear to be multiple ill-defined hepatic  lesions, concerning for metastatic disease, these were poorly  evaluated on today's examination secondary to patient respiratory  motion. Continued attention on any future followup studies is  recommended, as metastatic disease to the liver is suspected.  3. Severe intra and extrahepatic biliary ductal dilatation.   05/04/14 Doppler LLE: DVT at left femoral and popliteal veins not completely occlusive. Will anticoagulate with Eliquis 2.68m bid.    07/20/14 CT abd and pelvis:   IMPRESSION: 1. New tiny pleural effusions with adjacent atelectasis/ infiltrate. 2. Stable poorly marginated hepatic lesions suggesting metastatic disease. 3. Little change in pancreatic mass with patent metallic biliary stent and pneumobilia.   Assessment/Plan Type 2 diabetes mellitus 12/20/13 Hgb A1c 7.3 04/09/14 CBG am 150-180, pm 186/235-adding  Lantus 3 units qd qhs.  05/28/14 update Hgb A1c 7.1 06/27/14 continue Lantus 3 units daily.    SYSTOLIC HEART FAILURE, CHRONIC Compensated clinically.    Pancreatic cancer Patient was known to have a history of pancreatic cancer with liver metastases. Patient was seen in consultation by Dr. EMarin Olpfor oncology and it was recommended that patient resume his dose of xeloda. Patient will follow-up with oncology as outpatient.    Normocytic anemia 04/10/14 Hgb 9.3 09/17/14 Hgb 10.6 09/29/14 Hg 9.6    Insomnia C/o sleeps too much, change Temazepam 159mqhs to prn and Lorazepam 0.88m48mhs for sleep. The patient stated still cannot sleep at night-improved since 08/13/14 Mirtazapine 7.88mg76m qhs. Observe.     Hyponatremia wnl   Hypokalemia wnl   Cellulitis of great toe of left foot Left great toe nail came off-left great toe erythematous and mild swelling-Levaquin 750mg54mly x 7 days-Bactroban oint daily x 10 days. Observe.       Family/ Staff Communication: observe the patient  Goals of Care: SNF  Labs/tests ordered: CXR done 09/28/14. CBC and CMP done 09/29/14.  Urine culture is pending.

## 2014-10-01 NOTE — Assessment & Plan Note (Signed)
wnl

## 2014-10-01 NOTE — Assessment & Plan Note (Signed)
Compensated clinically.  

## 2014-10-01 NOTE — Assessment & Plan Note (Signed)
04/10/14 Hgb 9.3 09/17/14 Hgb 10.6 09/29/14 Hg 9.6

## 2014-10-01 NOTE — Assessment & Plan Note (Signed)
C/o sleeps too much, change Temazepam 15mg  qhs to prn and Lorazepam 0.5mg  qhs for sleep. The patient stated still cannot sleep at night-improved since 08/13/14 Mirtazapine 7.5mg  po qhs. Observe.

## 2014-10-04 ENCOUNTER — Other Ambulatory Visit: Payer: Self-pay | Admitting: Family

## 2014-10-08 ENCOUNTER — Other Ambulatory Visit (HOSPITAL_BASED_OUTPATIENT_CLINIC_OR_DEPARTMENT_OTHER): Payer: Medicare Other | Admitting: Lab

## 2014-10-08 ENCOUNTER — Encounter: Payer: Self-pay | Admitting: Family

## 2014-10-08 ENCOUNTER — Ambulatory Visit (HOSPITAL_BASED_OUTPATIENT_CLINIC_OR_DEPARTMENT_OTHER): Payer: Medicare Other | Admitting: Family

## 2014-10-08 VITALS — BP 110/51 | HR 57 | Temp 98.1°F | Resp 16 | Wt 169.0 lb

## 2014-10-08 DIAGNOSIS — C251 Malignant neoplasm of body of pancreas: Secondary | ICD-10-CM

## 2014-10-08 DIAGNOSIS — C787 Secondary malignant neoplasm of liver and intrahepatic bile duct: Secondary | ICD-10-CM | POA: Diagnosis not present

## 2014-10-08 DIAGNOSIS — C250 Malignant neoplasm of head of pancreas: Secondary | ICD-10-CM

## 2014-10-08 LAB — CMP (CANCER CENTER ONLY)
ALBUMIN: 3.1 g/dL — AB (ref 3.3–5.5)
ALK PHOS: 124 U/L — AB (ref 26–84)
ALT: 11 U/L (ref 10–47)
AST: 21 U/L (ref 11–38)
BUN, Bld: 22 mg/dL (ref 7–22)
CO2: 29 mEq/L (ref 18–33)
Calcium: 9.5 mg/dL (ref 8.0–10.3)
Chloride: 100 mEq/L (ref 98–108)
Creat: 1 mg/dl (ref 0.6–1.2)
GLUCOSE: 117 mg/dL (ref 73–118)
POTASSIUM: 4.6 meq/L (ref 3.3–4.7)
Sodium: 141 mEq/L (ref 128–145)
TOTAL PROTEIN: 7 g/dL (ref 6.4–8.1)
Total Bilirubin: 0.7 mg/dl (ref 0.20–1.60)

## 2014-10-08 LAB — CBC WITH DIFFERENTIAL (CANCER CENTER ONLY)
BASO#: 0 10*3/uL (ref 0.0–0.2)
BASO%: 0.4 % (ref 0.0–2.0)
EOS%: 2.7 % (ref 0.0–7.0)
Eosinophils Absolute: 0.1 10*3/uL (ref 0.0–0.5)
HEMATOCRIT: 28.6 % — AB (ref 38.7–49.9)
HGB: 9.5 g/dL — ABNORMAL LOW (ref 13.0–17.1)
LYMPH#: 1.3 10*3/uL (ref 0.9–3.3)
LYMPH%: 27 % (ref 14.0–48.0)
MCH: 35.8 pg — ABNORMAL HIGH (ref 28.0–33.4)
MCHC: 33.2 g/dL (ref 32.0–35.9)
MCV: 108 fL — ABNORMAL HIGH (ref 82–98)
MONO#: 0.4 10*3/uL (ref 0.1–0.9)
MONO%: 8.4 % (ref 0.0–13.0)
NEUT%: 61.5 % (ref 40.0–80.0)
NEUTROS ABS: 2.9 10*3/uL (ref 1.5–6.5)
Platelets: 193 10*3/uL (ref 145–400)
RBC: 2.65 10*6/uL — ABNORMAL LOW (ref 4.20–5.70)
RDW: 16.6 % — AB (ref 11.1–15.7)
WBC: 4.8 10*3/uL (ref 4.0–10.0)

## 2014-10-08 NOTE — Progress Notes (Signed)
Hematology and Oncology Follow Up Visit  JARAY BOLIVER [id number] 1922/12/31 81 y.o. 10/08/2014   Principle Diagnosis:  Stage IV pancreatic cancer with mets to liver. Bilateral lower extremity DVT   Current Therapy:   Xeloda (14/7) s/p 6 cycles  ELIQUIS 2.5 mg by mouth twice a day    Interim History: Mr. Nieman is here today with a friend for a follow-up. He states that her had a fever of 104.9 on 3/11. We were not made aware of this. The staff where he lives were able to get it back down within a few hours. We will re consult Dr. Ardis Hughs to look at removing and possibly replacing his biliary stent.  He has cellulitis of his left great toe and is being treated with antibiotics. His dressing is changed daily.  He has slight swelling of the left leg but no pain. He is still wearing compression stockings and he takes Eliquis daily.  He denies chills, n/v, cough, rash, headache, dizziness, SOB, chest pain, palpitations, diarrhea, blood in urine or stool. The lactulose continues to help with his constipation.  He's had no tenderness, numbness or tingling in his extremities.  His appetite is good and he is staying hydrated. His weight is stable. His CA 19-9, in February, was 71.8.   Medications:    Medication List       This list is accurate as of: 10/08/14  5:02 PM.  Always use your most recent med list.               apixaban 5 MG Tabs tablet  Commonly known as:  ELIQUIS  Take 1 tablet (5 mg total) by mouth 2 (two) times daily.     bisacodyl 10 MG suppository  Commonly known as:  DULCOLAX  Place 1 suppository (10 mg total) rectally daily as needed for moderate constipation.     capecitabine 500 MG tablet  Commonly known as:  XELODA  Take 3 tablets (1,500 mg total) by mouth 2 (two) times daily after a meal. TAKES 3 TABS (1500) MG BID  ///  ON 14 DAYS OFF 7 DAYS FOR 3 CYCLES.   STARTED 06/06/14     carvedilol 12.5 MG tablet  Commonly known as:  COREG  Take 12.5 mg by mouth  2 (two) times daily with a meal.     DSS 100 MG Caps  Take 100 mg by mouth 2 (two) times daily.     feeding supplement (GLUCERNA SHAKE) Liqd  Take 237 mLs by mouth 2 (two) times daily between meals as needed (If pt with <50% meal completion).     Fish Oil 1000 MG Caps  Take 1,000 mg by mouth every morning.     HYDROcodone-acetaminophen 10-325 MG per tablet  Commonly known as:  NORCO  Take one tablet by mouth every 4 hours as needed for pain     insulin glargine 100 UNIT/ML injection  Commonly known as:  LANTUS  Inject 3 Units into the skin at bedtime.     lactulose 10 GM/15ML solution  Commonly known as:  Slater  Take by mouth daily as needed for mild constipation.     levofloxacin 750 MG tablet  Commonly known as:  LEVAQUIN  Take 750 mg by mouth daily.     lidocaine-prilocaine cream  Commonly known as:  EMLA  Apply 1 application topically as needed.     LORazepam 0.5 MG tablet  Commonly known as:  ATIVAN  Take 2 tablets (1 mg total) by  mouth every 6 (six) hours as needed (nausea/vomiting).     mirtazapine 7.5 MG tablet  Commonly known as:  REMERON  Take 7.5 mg by mouth at bedtime.     multivitamin with minerals Tabs tablet  Take 1 tablet by mouth daily.     nitroGLYCERIN 0.4 MG SL tablet  Commonly known as:  NITROSTAT  Place 1 tablet (0.4 mg total) under the tongue every 5 (five) minutes as needed for chest pain.     oxyCODONE 5 MG immediate release tablet  Commonly known as:  Oxy IR/ROXICODONE  Take one tablet by mouth twice daily for pain     polyethylene glycol packet  Commonly known as:  MIRALAX / GLYCOLAX  Take 17 g by mouth 2 (two) times daily as needed for mild constipation.     polyvinyl alcohol 1.4 % ophthalmic solution  Commonly known as:  LIQUIFILM TEARS  Place 1 drop into both eyes 4 (four) times daily.     pravastatin 40 MG tablet  Commonly known as:  PRAVACHOL  Take 40 mg by mouth every evening.     pregabalin 75 MG capsule  Commonly  known as:  LYRICA  Take 75 mg by mouth every morning.     PRESERVISION AREDS 2 PO  Take 1 capsule by mouth 2 (two) times daily.     prochlorperazine 5 MG tablet  Commonly known as:  COMPAZINE  Take 5 mg by mouth every 6 (six) hours as needed for nausea or vomiting.     ranitidine 150 MG tablet  Commonly known as:  ZANTAC  Take 150 mg by mouth 2 (two) times daily.     saccharomyces boulardii 250 MG capsule  Commonly known as:  FLORASTOR  Take 250 mg by mouth 2 (two) times daily.     temazepam 15 MG capsule  Commonly known as:  RESTORIL  Take 15 mg by mouth at bedtime.     ZOFRAN 8 MG tablet  Generic drug:  ondansetron  Take by mouth 2 (two) times daily as needed for nausea or vomiting.        Allergies: No Known Allergies  Past Medical History, Surgical history, Social history, and Family History were reviewed and updated.  Review of Systems: All other 10 point review of systems is negative.   Physical Exam:  weight is 169 lb (76.658 kg). His oral temperature is 98.1 F (36.7 C). His blood pressure is 110/51 and his pulse is 57. His respiration is 16.   Wt Readings from Last 3 Encounters:  10/08/14 169 lb (76.658 kg)  09/17/14 168 lb (76.204 kg)  08/27/14 161 lb 1.6 oz (73.074 kg)    Ocular: Sclerae unicteric, pupils equal, round and reactive to light Ear-nose-throat: Oropharynx clear, dentition fair Lymphatic: No cervical or supraclavicular adenopathy Lungs no rales or rhonchi, good excursion bilaterally Heart regular rate and rhythm, no murmur appreciated Abd soft, nontender, positive bowel sounds MSK no focal spinal tenderness, no joint edema Neuro: non-focal, well-oriented, appropriate affect  Lab Results  Component Value Date   WBC 4.8 10/08/2014   HGB 9.5* 10/08/2014   HCT 28.6* 10/08/2014   MCV 108* 10/08/2014   PLT 193 10/08/2014   No results found for: FERRITIN, IRON, TIBC, UIBC, IRONPCTSAT Lab Results  Component Value Date   RBC 2.65*  10/08/2014   No results found for: KPAFRELGTCHN, LAMBDASER, KAPLAMBRATIO No results found for: IGGSERUM, IGA, IGMSERUM No results found for: TOTALPROTELP, ALBUMINELP, A1GS, A2GS, BETS, BETA2SER, [place], MSPIKE, SPEI  Chemistry      Component Value Date/Time   NA 141 10/08/2014 1330   NA 137 09/29/2014   NA 133* 07/22/2014 0500   K 4.6 10/08/2014 1330   K 3.8 09/29/2014   CL 100 10/08/2014 1330   CL 100 07/22/2014 0500   CO2 29 10/08/2014 1330   CO2 27 07/22/2014 0500   BUN 22 10/08/2014 1330   BUN 19 09/29/2014   BUN 22 07/22/2014 0500   CREATININE 1.0 10/08/2014 1330   CREATININE 0.9 09/29/2014   CREATININE 1.00 07/22/2014 0500   GLU 114 09/29/2014      Component Value Date/Time   CALCIUM 9.5 10/08/2014 1330   CALCIUM 8.9 07/22/2014 0500   ALKPHOS 124* 10/08/2014 1330   ALKPHOS 141* 09/29/2014   AST 21 10/08/2014 1330   AST 12* 09/29/2014   ALT 11 10/08/2014 1330   ALT 8* 09/29/2014   BILITOT 0.70 10/08/2014 1330   BILITOT 0.9 07/21/2014 0530     Impression and Plan: Mr. Lamons is 81 year old gentleman with metastatic pancreatic cancer. He is on Xeloda and tolerating this well. His CT scan in February showed he is responding.  We will continue him on the Xeloda.  He will be due again for scans in May.  His CA-19-9 continues to come down nicely. We will see what his from today shows.  We will see him back in 4 weeks for labs and follow-up before beginning cycle 8 of Xeloda.  He and his friend from church understand to let us know if he has any more fevers of 100.5 or greater. We can certainly see him sooner if need be.   Eliezer Bottom, NP 3/21/20165:02 PM

## 2014-10-09 ENCOUNTER — Other Ambulatory Visit: Payer: Self-pay | Admitting: *Deleted

## 2014-10-09 DIAGNOSIS — C250 Malignant neoplasm of head of pancreas: Secondary | ICD-10-CM

## 2014-10-09 LAB — CANCER ANTIGEN 19-9: CA 19 9: 43 U/mL — AB (ref <35.0)

## 2014-10-31 ENCOUNTER — Non-Acute Institutional Stay (SKILLED_NURSING_FACILITY): Payer: Medicare Other | Admitting: Nurse Practitioner

## 2014-10-31 ENCOUNTER — Encounter: Payer: Self-pay | Admitting: Nurse Practitioner

## 2014-10-31 DIAGNOSIS — I82409 Acute embolism and thrombosis of unspecified deep veins of unspecified lower extremity: Secondary | ICD-10-CM

## 2014-10-31 DIAGNOSIS — I5022 Chronic systolic (congestive) heart failure: Secondary | ICD-10-CM

## 2014-10-31 DIAGNOSIS — K219 Gastro-esophageal reflux disease without esophagitis: Secondary | ICD-10-CM

## 2014-10-31 DIAGNOSIS — G4700 Insomnia, unspecified: Secondary | ICD-10-CM

## 2014-10-31 DIAGNOSIS — K589 Irritable bowel syndrome without diarrhea: Secondary | ICD-10-CM | POA: Diagnosis not present

## 2014-10-31 DIAGNOSIS — E1121 Type 2 diabetes mellitus with diabetic nephropathy: Secondary | ICD-10-CM | POA: Diagnosis not present

## 2014-10-31 DIAGNOSIS — I10 Essential (primary) hypertension: Secondary | ICD-10-CM | POA: Diagnosis not present

## 2014-10-31 DIAGNOSIS — C253 Malignant neoplasm of pancreatic duct: Secondary | ICD-10-CM | POA: Diagnosis not present

## 2014-10-31 NOTE — Assessment & Plan Note (Signed)
Compensated clinically. Trace ankle edema.

## 2014-10-31 NOTE — Assessment & Plan Note (Signed)
Blood pressure controlled on Coreg 12.5mg  bid

## 2014-10-31 NOTE — Assessment & Plan Note (Signed)
Takes ranitidine 150mg  bid. Stable.

## 2014-10-31 NOTE — Assessment & Plan Note (Signed)
Continue Eliquis 5 mg b.i.d..

## 2014-10-31 NOTE — Progress Notes (Signed)
Patient ID: [name] [name], male   DOB: April 27, 1923, 81 y.o.   MRN: [id number]   Code Status: DNR  No Known Allergies  Chief Complaint  Patient presents with  . Medical Management of Chronic Issues    HPI: Patient is a 81 y.o. male seen in the SNF at [facility] today for evaluation of  and other chronic medical conditions.  Problem List Items Addressed This Visit    DVT (deep venous thrombosis) (Chronic)    Continue Eliquis 68m bid.       GERD    Takes ranitidine 1560mbid. Stable.         HYPERTENSION, BENIGN (Chronic)    Blood pressure controlled on Coreg 12.49m23mid      IBS    Managed with Colace 100m69md, Dulxolax 10mg78mpository prn daily, Enulose 30mg 47my prn, MiraLax daily prn.       Insomnia    C/o sleeps too much, change Temazepam 149mg q33mo prn and Lorazepam 0.49mg qhs69mr sleep. The patient stated still cannot sleep at night-improved since 08/13/14 Mirtazapine 7.49mg po q61m  Stable, continue Mirtazapine 7.49mg night50mand prn Temazepam 149mg hs an54mrazepam 0.49mg qhs and49mg q6h prn.79m      Pancreatic cancer (Chronic)    Pain is managed with Oxycodone 49mg bid, prn 63mco q4h, Compazine 49mg q6h prn, Z33man 8mg bid prn. Co83mnue taking Capecitabine 1500mg bid 14 days56mn off 7 days directed by Oncology.       SYSTOLIC HEART FAILURE, CHRONIC (Chronic)    Compensated clinically. Trace ankle edema.        Type 2 diabetes mellitus - Primary (Chronic)    12/20/13 Hgb A1c 7.3 04/09/14 CBG am 150-180, pm 186/235-adding Lantus 3 units qd qhs.  05/28/14 update Hgb A1c 7.1 06/27/14 continue Lantus 3 units daily.           Review of Systems:  Review of Systems  Constitutional: Positive for fatigue. Negative for fever, chills, diaphoresis, activity change and appetite change.  HENT: Positive for hearing loss. Negative for congestion, ear discharge, ear pain, nosebleeds, sore throat and tinnitus.   Eyes: Negative for photophobia, pain, discharge and  redness.  Respiratory: Negative for cough, shortness of breath, wheezing and stridor.   Cardiovascular: Positive for leg swelling. Negative for chest pain and palpitations.       Trace ankles.   Gastrointestinal: Negative for nausea, vomiting, abdominal pain, diarrhea, constipation and blood in stool.  Endocrine: Negative for polydipsia.  Genitourinary: Positive for frequency. Negative for dysuria, urgency, hematuria and flank pain.  Musculoskeletal: Negative for myalgias, back pain and neck pain.  Skin: Negative for rash.       Left great toe nail came off-erythematous and swelling the left great toe R upper chest Pot A Cath intact.   Allergic/Immunologic: Negative for environmental allergies.  Neurological: Negative for dizziness, tremors, seizures, weakness and headaches.  Hematological: Does not bruise/bleed easily.  Psychiatric/Behavioral: Negative for suicidal ideas and hallucinations. The patient is not nervous/anxious.      Past Medical History  Diagnosis Date  . Diverticulosis of colon (without mention of hemorrhage)   . Irritable bowel syndrome   . Personal history of malignant neoplasm of prostate     s/p  radioactive seed implants 2000  . Unspecified hereditary and idiopathic peripheral neuropathy   . Nonischemic cardiomyopathy cardiologist-  dr mclean    EF 25% Aundra Dubinast echo 2012 and cardiologist note--  2007-  ef  40-45%;  2008- ef 35-40%;  2010- ef 30-35%;  2012- ef 25%  . Sinus bradycardia   . Hypertension   . Arthritis     hands  . Hyperlipidemia   . History of DVT of lower extremity     2002  ,   left leg  . History of adenomatous polyp of colon     2001  . History of basal cell carcinoma excision   . Anticoagulated on Coumadin   . Type 2 diabetes, diet controlled   . GERD (gastroesophageal reflux disease)   . LBBB (left bundle branch block)   . Wears glasses   . Wears hearing aid     BILATERAL  . Unsteady gait   . DDD (degenerative disc disease)      cervical and lumbar  . DJD (degenerative joint disease)     hips  . Lower urinary tract symptoms (LUTS)   . 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 Coronary artery disease     per cath 2002  non-obstructive cad  . Chronic systolic heart failure   . H/O fracture of nose     12-30-2013-  pt fell at home open nose bone fx  . Abdominal pain 12/20/2013  . Abnormal MRI of abdomen 01/31/2014  . Acute encephalopathy 07/02/2014  . Acute thromboembolism of deep veins of lower extremity 05/03/2007    Qualifier: History of  By: Doy Mince LPN, Megan  41/66/06 Doppler LLE: DVT at left femoral and popliteal veins not completely occlusive. Will anticoagulate with Eliquis 2.42m bid.    . Anemia of chronic disease 02/20/2013  . CARDIOMYOPATHY 07/06/2007    Qualifier: Diagnosis of  By: NLenna GilfordMD, SDeborra Medina  . Constipation 04/04/2014  . Coronary atherosclerosis 07/06/2007    Qualifier: Diagnosis of  By: NLenna GilfordMD, SHastingsDISEASE 05/03/2007    Qualifier: Diagnosis of  By: RDoy MinceLPN, Megan    . DVT (deep venous thrombosis)   . Edema 05/28/2014  . Failure to thrive in adult 03/30/2014  . Fall in elderly patient 12/30/2013  . Fever 07/20/2014  . Frailty 03/30/2014  . FUO (fever of unknown origin) 05/28/2014    05/28/14 CXR right lower lobe bronchopneumonia.    .Marland KitchenGERD 05/03/2007    Qualifier: Diagnosis of  By: RDoy MinceLPN, Megan    . Hereditary and idiopathic peripheral neuropathy 07/06/2007    Qualifier: Diagnosis of  By: NLenna GilfordMD, SDeborra Medina  . HYPERLIPIDEMIA 05/03/2007    Qualifier: Diagnosis of  By: RDoy MinceLPN, Megan    . HYPERTENSION, BENIGN 06/23/2010    Qualifier: Diagnosis of  By: BOlevia Perches MD, FGlenetta Hew  . Insomnia 04/04/2014  . Liver metastasis 04/01/2014  . Malnutrition of moderate degree 03/30/2014    05/01/14 albumin 2.8 05/29/14 albumin 3.0   . Metastatic cancer 03/29/2014  .  Normocytic anemia 03/30/2014    04/10/14 Hgb 9.3   . Pancreatic cancer 04/06/2014    06/06/14 Oncology: stage IV pancreatic cancer with Mets to liver: CA 19-9 400--194. CT showed more liver metastases. Xeloda 15025mbid x 14days on and 7 days off. 3 cycles of tx and then repeat the scans.    . Pressure ulcer stage II 08/15/2014    08/14/14 L scrotum small PU-not infected-tx initiated.    . Protein-calorie malnutrition, severe 07/05/2014  . SPINAL STENOSIS, LUMBAR 07/06/2007  Qualifier: Diagnosis of  By: Lenna Gilford MD, Deborra Medina   . Streptococcal bacteremia 07/23/2014  . Transaminitis 03/30/2014    04/10/14 Alkaline phosphatase 126(39-117) 05/01/14 AST 33, ALT 45, alk  Phos 75 05/29/14 AST 64, ALT 70 alk phos 209   . Type 2 diabetes mellitus 05/03/2007    Qualifier: Diagnosis of  By: Doy Mince LPN, Megan  35/57/32 Hgb A1c 7.1    . Weakness generalized 04/02/2014   Past Surgical History  Procedure Laterality Date  . Knee arthroscopy Left 1991  . Ercp with sphincterotomy and stone removal  05/ 1993  &  05-11-2006  . Total knee arthroplasty  10/05/2011    Procedure: TOTAL KNEE ARTHROPLASTY;  Surgeon: Gearlean Alf, MD;  Location: [facility];  Service: Orthopedics;  Laterality: Right;  . Transurethral resection of bladder tumor N/A 09/30/2012    Procedure: TRANSURETHRAL RESECTION OF BLADDER TUMOR (TURBT);  Surgeon: Franchot Gallo, MD;  Location: [facility];  Service: Urology;  Laterality: N/A;  WITH MITOMYCIN INSTILLATION   . Eus N/A 01/04/2014    Procedure: ESOPHAGEAL ENDOSCOPIC ULTRASOUND (EUS) RADIAL;  Surgeon: [name], MD;  Location: [facility];  Service: Endoscopy;  Laterality: N/A;  . Radioactive prostate seed implants  01/ 2000  . Mohs surgery  03/ 2009    LEFT EAR BASAL CELL CARCINOMA  . Tonsillectomy  AS CHILD  . Cholecystectomy open  1985  . Appendectomy  child  . Inguinal hernia repair Bilateral 1998    recurrent RIH  w/ repair 06-21-2006  . Total knee arthroplasty Left 02-21-2007     AND 04-21-2007  CLOSED MANIPULATION  . Cataract extraction w/ intraocular lens implant Right 2004  . Lumbar spine surgery  X2  . Cardiac catheterization  06-14-2001  dr Darnell Level brodie    non-obstructive CAD/  LM 30%,  pLAD 50%,  mLAD 30%,  RCA  &  CFX  irregularites ,  global hypokinesis,  ef 40%  . Cardiovascular stress test  02-07-2007   dr Darnell Level brodie    negative for ischemia,  low risk myoview perfusion study  . Transthoracic echocardiogram  01-28-2011    moderate LVH/  ef 25%/  diffuse hypokinesis/  mild LAE/  mild TR/  trivial AR & MR  . Cystoscopy w/ retrogrades Bilateral 03/09/2014    Procedure: CYSTOSCOPY WITH BILATERAL RETROGRADE PYELOGRAM BLADDER BX AND RENAL WASHINGS.;  Surgeon: Jorja Loa, MD;  Location: [facility];  Service: Urology;  Laterality: Bilateral;  . Eus N/A 04/05/2014    Procedure: UPPER ENDOSCOPIC ULTRASOUND (EUS) LINEAR;  Surgeon: [name], MD;  Location: [facility];  Service: Endoscopy;  Laterality: N/A;  radial linear  . Endoscopic retrograde cholangiopancreatography (ercp) with propofol N/A 04/12/2014    Procedure: ENDOSCOPIC RETROGRADE CHOLANGIOPANCREATOGRAPHY (ERCP) WITH PROPOFOL;  Surgeon: [name], MD;  Location: [facility];  Service: Endoscopy;  Laterality: N/A;  metal stent  . Biliary stent placement N/A 04/12/2014    Procedure: BILIARY STENT PLACEMENT;  Surgeon: [name], MD;  Location: [facility];  Service: Endoscopy;  Laterality: N/A;   Social History:   reports that he quit smoking about 32 years ago. His smoking use included Cigarettes. He started smoking about 60 years ago. He has a 10 pack-year smoking history. He has never used smokeless tobacco. He reports that he does not drink alcohol or use illicit drugs.  Family History  Problem Relation Age of Onset  . Throat cancer Brother   . Coronary artery disease Father  deceased    Medications: Patient's Medications  New Prescriptions   No  medications on file  Previous Medications   APIXABAN (ELIQUIS) 5 MG TABS TABLET    Take 1 tablet (5 mg total) by mouth 2 (two) times daily.   BISACODYL (DULCOLAX) 10 MG SUPPOSITORY    Place 1 suppository (10 mg total) rectally daily as needed for moderate constipation.   CAPECITABINE (XELODA) 500 MG TABLET    Take 3 tablets (1,500 mg total) by mouth 2 (two) times daily after a meal. TAKES 3 TABS (1500) MG BID  ///  ON 14 DAYS OFF 7 DAYS FOR 3 CYCLES.   STARTED 06/06/14   CARVEDILOL (COREG) 12.5 MG TABLET    Take 12.5 mg by mouth 2 (two) times daily with a meal.   DOCUSATE SODIUM 100 MG CAPS    Take 100 mg by mouth 2 (two) times daily.   FEEDING SUPPLEMENT, GLUCERNA SHAKE, (GLUCERNA SHAKE) LIQD    Take 237 mLs by mouth 2 (two) times daily between meals as needed (If pt with <50% meal completion).   HYDROCODONE-ACETAMINOPHEN (NORCO) 10-325 MG PER TABLET    Take one tablet by mouth every 4 hours as needed for pain   INSULIN GLARGINE (LANTUS) 100 UNIT/ML INJECTION    Inject 3 Units into the skin at bedtime.   LACTULOSE (CHRONULAC) 10 GM/15ML SOLUTION    Take by mouth daily as needed for mild constipation.   LEVOFLOXACIN (LEVAQUIN) 750 MG TABLET    Take 750 mg by mouth daily.   LIDOCAINE-PRILOCAINE (EMLA) CREAM    Apply 1 application topically as needed.   LORAZEPAM (ATIVAN) 0.5 MG TABLET    Take 2 tablets (1 mg total) by mouth every 6 (six) hours as needed (nausea/vomiting).   MIRTAZAPINE (REMERON) 7.5 MG TABLET    Take 7.5 mg by mouth at bedtime.   MULTIPLE VITAMIN (MULTIVITAMIN WITH MINERALS) TABS TABLET    Take 1 tablet by mouth daily.   MULTIPLE VITAMINS-MINERALS (PRESERVISION AREDS 2 PO)    Take 1 capsule by mouth 2 (two) times daily.   NITROGLYCERIN (NITROSTAT) 0.4 MG SL TABLET    Place 1 tablet (0.4 mg total) under the tongue every 5 (five) minutes as needed for chest pain.   OMEGA-3 FATTY ACIDS (FISH OIL) 1000 MG CAPS    Take 1,000 mg by mouth every morning.    ONDANSETRON (ZOFRAN) 8 MG TABLET     Take by mouth 2 (two) times daily as needed for nausea or vomiting.    OXYCODONE (OXY IR/ROXICODONE) 5 MG IMMEDIATE RELEASE TABLET    Take one tablet by mouth twice daily for pain   POLYETHYLENE GLYCOL (MIRALAX / GLYCOLAX) PACKET    Take 17 g by mouth 2 (two) times daily as needed for mild constipation.   POLYVINYL ALCOHOL (LIQUIFILM TEARS) 1.4 % OPHTHALMIC SOLUTION    Place 1 drop into both eyes 4 (four) times daily.   PRAVASTATIN (PRAVACHOL) 40 MG TABLET    Take 40 mg by mouth every evening.    PREGABALIN (LYRICA) 75 MG CAPSULE    Take 75 mg by mouth every morning.    PROCHLORPERAZINE (COMPAZINE) 5 MG TABLET    Take 5 mg by mouth every 6 (six) hours as needed for nausea or vomiting.   RANITIDINE (ZANTAC) 150 MG TABLET    Take 150 mg by mouth 2 (two) times daily.   SACCHAROMYCES BOULARDII (FLORASTOR) 250 MG CAPSULE    Take 250 mg by mouth 2 (two) times  daily.   TEMAZEPAM (RESTORIL) 15 MG CAPSULE    Take 15 mg by mouth at bedtime.   Modified Medications   No medications on file  Discontinued Medications   No medications on file     Physical Exam: Physical Exam  Constitutional: He is oriented to person, place, and time. He appears well-developed and well-nourished.  HENT:  Head: Normocephalic and atraumatic.  Mouth/Throat: Oropharynx is clear and moist.  Eyes: Conjunctivae and EOM are normal. Pupils are equal, round, and reactive to light. Right eye exhibits no discharge. Left eye exhibits no discharge. No scleral icterus.  Neck: Normal range of motion. Neck supple. No JVD present. No tracheal deviation present. No thyromegaly present.  Cardiovascular: Normal rate, regular rhythm and normal heart sounds.   Pulmonary/Chest: Effort normal and breath sounds normal. No stridor. He has no wheezes. He has no rales. He exhibits no tenderness.  Abdominal: Soft. Bowel sounds are normal. He exhibits no distension. There is no tenderness. There is no rebound and no guarding.  Minimal epigastric  tenderness.  No ascites  Musculoskeletal: Normal range of motion. He exhibits edema. He exhibits no tenderness.  LLE  Lymphadenopathy:    He has no cervical adenopathy.  Neurological: He is alert and oriented to person, place, and time. No cranial nerve deficit.  Skin: Skin is warm and dry. No rash noted. No erythema. No pallor.     Psychiatric: He has a normal mood and affect. His behavior is normal.    Filed Vitals:   10/31/14 1256  BP: 120/60  Pulse: 78  Temp: 97.7 F (36.5 C)  TempSrc: Tympanic  Resp: 20      Labs reviewed: Basic Metabolic Panel:  Recent Labs  05/29/14  07/20/14 0500  08/23/14 1309  09/17/14 1259 09/29/14 10/08/14 1330  NA 136*  < > 137  < > 138  < > 139 137 141  K 4.3  < > 4.0  < > 4.5  < > 4.5 3.8 4.6  CL  --   < > 103  < > 97*  --  96*  --  100  CO2  --   < > 27  < > 26  --  27  --  29  GLUCOSE  --   < > 122*  < > 157*  --  135*  --  117  BUN 22*  < > 21  < > 25*  < > 18 19 22   CREATININE 1.0  < > 0.99  < > 1.0  < > 1.1 0.9 1.0  CALCIUM  --   < > 8.8  < > 9.3  --  9.3  --  9.5  TSH 1.15  --  0.846  --   --   --   --   --   --   < > = values in this interval not displayed. Liver Function Tests:  Recent Labs  07/19/14 2023 07/20/14 0500 07/21/14 0530  08/23/14 1309  09/17/14 1259 09/29/14 10/08/14 1330  AST 76* 57* 32  < > 97*  < > 49* 12* 21  ALT 48 40 30  < > 83*  < > 45 8* 11  ALKPHOS 278* 240* 194*  < > 300*  < > 261* 141* 124*  BILITOT 0.6 0.9 0.9  < > 0.90  --  1.10  --  0.70  PROT 6.7 6.1 6.2  < > 7.0  --  7.6  --  7.0  ALBUMIN 3.4* 3.1*  3.0*  --   --   --   --   --   --   < > = values in this interval not displayed.  Recent Labs  12/19/13 1514 03/29/14 1610 07/02/14 1845  LIPASE 8.0* 10* 7*   No results for input(s): AMMONIA in the last 8760 hours. CBC:  Recent Labs  08/23/14 1309  09/17/14 1259 09/29/14 10/08/14 1330  WBC 4.8  < > 6.1 6.1 4.8  NEUTROABS 3.2  --  4.1  --  2.9  HGB 10.9*  < > 10.6* 9.6* 9.5*    HCT 32.4*  < > 31.5* 27* 28.6*  MCV 103*  --  105*  --  108*  PLT 169  < > 215 18* 193  < > = values in this interval not displayed. Lipid Panel: No results for input(s): CHOL, HDL, LDLCALC, TRIG, CHOLHDL, LDLDIRECT in the last 8760 hours.  Past Procedures:  03/29/14 CT abd and pelvis w CM:  IMPRESSION:  4.9 cm enhancing lesion adjacent to the pancreatic head/uncinate  process, worrisome for primary pancreatic adenocarcinoma,  progressed.  Tumor likely involves the SMA/SMV and undersurface of the portal  vein. 9 mm short axis node in the porta hepatitis.  Suspected multifocal hepatic metastases, including a 3.2 cm lesion  in the right hepatic dome.   03/30/14 MR w wo CM abd:   IMPRESSION:  1. Subtle amorphous mass in the pancreatic head and proximal  pancreatic body currently measuring 4.4 x 3.2 cm, causing distal  common bile duct obstruction and demonstrating vascular involvement  (SMA, SMV, splenic vein, splenic portal confluence and proximal  portal vein), as above, highly concerning for primary pancreatic  neoplasm.  2. Although there do appear to be multiple ill-defined hepatic  lesions, concerning for metastatic disease, these were poorly  evaluated on today's examination secondary to patient respiratory  motion. Continued attention on any future followup studies is  recommended, as metastatic disease to the liver is suspected.  3. Severe intra and extrahepatic biliary ductal dilatation.   05/04/14 Doppler LLE: DVT at left femoral and popliteal veins not completely occlusive. Will anticoagulate with Eliquis 2.3m bid.    07/20/14 CT abd and pelvis:   IMPRESSION: 1. New tiny pleural effusions with adjacent atelectasis/ infiltrate. 2. Stable poorly marginated hepatic lesions suggesting metastatic disease. 3. Little change in pancreatic mass with patent metallic biliary stent and pneumobilia.   Assessment/Plan Type 2 diabetes mellitus 12/20/13  Hgb A1c 7.3 04/09/14 CBG am 150-180, pm 186/235-adding Lantus 3 units qd qhs.  05/28/14 update Hgb A1c 7.1 06/27/14 continue Lantus 3 units daily.     Insomnia C/o sleeps too much, change Temazepam 158mqhs to prn and Lorazepam 0.64m7mhs for sleep. The patient stated still cannot sleep at night-improved since 08/13/14 Mirtazapine 7.64mg32m qhs.  Stable, continue Mirtazapine 7.64mg 70mhtly and prn Temazepam 164mg 60mnd Lorazepam 0.64mg qh664mnd 1mg q6h83mn.      HYPERTENSION, BENIGN Blood pressure controlled on Coreg 12.64mg bid 41mancreatic cancer Pain is managed with Oxycodone 64mg bid, 8m Norco q4h, Compazine 64mg q6h pr66mZofran 8mg bid prn54montinue taking Capecitabine 1500mg bid 14 60m then off 7 days directed by Oncology.    GERD Takes ranitidine 150mg bid. Sta364m      IBS Managed with Colace 100mg bid, Dulx61m 10mg suppositor74mn daily, Enulose 30mg daily prn, 77mLax daily prn.    DVT (deep venous thrombosis) Continue Eliquis 64mg bid.    SYSTO58m HEART FAILURE, CHRONIC  Compensated clinically. Trace ankle edema.       Family/ Staff Communication: observe the patient  Goals of Care: SNF  Labs/tests ordered: none

## 2014-10-31 NOTE — Assessment & Plan Note (Signed)
Managed with Colace 100mg  bid, Dulxolax 10mg  suppository prn daily, Enulose 30mg  daily prn, MiraLax daily prn.

## 2014-10-31 NOTE — Assessment & Plan Note (Addendum)
C/o sleeps too much, change Temazepam 15mg  qhs to prn and Lorazepam 0.5mg  qhs for sleep. The patient stated still cannot sleep at night-improved since 08/13/14 Mirtazapine 7.5mg  po qhs.  Stable, continue Mirtazapine 7.5mg  nightly and prn Temazepam 15mg  hs and Lorazepam 0.5mg  qhs and 1mg  q6h prn.

## 2014-10-31 NOTE — Assessment & Plan Note (Signed)
Pain is managed with Oxycodone 5mg  bid, prn Norco q4h, Compazine 5mg  q6h prn, Zofran 8mg  bid prn. Continue taking Capecitabine 1500mg  bid 14 days then off 7 days directed by Oncology.

## 2014-10-31 NOTE — Assessment & Plan Note (Signed)
12/20/13 Hgb A1c 7.3 04/09/14 CBG am 150-180, pm 186/235-adding Lantus 3 units qd qhs.  05/28/14 update Hgb A1c 7.1 06/27/14 continue Lantus 3 units daily.

## 2014-11-05 ENCOUNTER — Encounter: Payer: Self-pay | Admitting: Hematology & Oncology

## 2014-11-05 ENCOUNTER — Ambulatory Visit (HOSPITAL_BASED_OUTPATIENT_CLINIC_OR_DEPARTMENT_OTHER): Payer: Medicare Other | Admitting: Hematology & Oncology

## 2014-11-05 ENCOUNTER — Other Ambulatory Visit (HOSPITAL_BASED_OUTPATIENT_CLINIC_OR_DEPARTMENT_OTHER): Payer: Medicare Other

## 2014-11-05 VITALS — BP 128/63 | HR 54 | Temp 97.9°F | Resp 20 | Ht 69.0 in | Wt 170.0 lb

## 2014-11-05 DIAGNOSIS — C250 Malignant neoplasm of head of pancreas: Secondary | ICD-10-CM

## 2014-11-05 DIAGNOSIS — C251 Malignant neoplasm of body of pancreas: Secondary | ICD-10-CM

## 2014-11-05 DIAGNOSIS — C787 Secondary malignant neoplasm of liver and intrahepatic bile duct: Secondary | ICD-10-CM

## 2014-11-05 LAB — COMPREHENSIVE METABOLIC PANEL
ALT: 8 U/L (ref 0–53)
AST: 18 U/L (ref 0–37)
Albumin: 3.1 g/dL — ABNORMAL LOW (ref 3.5–5.2)
Alkaline Phosphatase: 94 U/L (ref 39–117)
BUN: 15 mg/dL (ref 6–23)
CALCIUM: 8.5 mg/dL (ref 8.4–10.5)
CHLORIDE: 105 meq/L (ref 96–112)
CO2: 26 meq/L (ref 19–32)
CREATININE: 0.9 mg/dL (ref 0.50–1.35)
Glucose, Bld: 176 mg/dL — ABNORMAL HIGH (ref 70–99)
Potassium: 3.9 mEq/L (ref 3.5–5.3)
Sodium: 140 mEq/L (ref 135–145)
TOTAL PROTEIN: 5.8 g/dL — AB (ref 6.0–8.3)
Total Bilirubin: 0.8 mg/dL (ref 0.2–1.2)

## 2014-11-05 LAB — CBC AND DIFFERENTIAL
HCT: 28 % — AB (ref 41–53)
Hemoglobin: 9.4 g/dL — AB (ref 13.5–17.5)
Platelets: 18 10*3/uL — AB (ref 150–399)

## 2014-11-05 LAB — CBC WITH DIFFERENTIAL (CANCER CENTER ONLY)
BASO#: 0 10*3/uL (ref 0.0–0.2)
BASO%: 0.5 % (ref 0.0–2.0)
EOS%: 1.4 % (ref 0.0–7.0)
Eosinophils Absolute: 0.1 10*3/uL (ref 0.0–0.5)
HEMATOCRIT: 28.4 % — AB (ref 38.7–49.9)
HEMOGLOBIN: 9.4 g/dL — AB (ref 13.0–17.1)
LYMPH#: 1.3 10*3/uL (ref 0.9–3.3)
LYMPH%: 21 % (ref 14.0–48.0)
MCH: 35.9 pg — ABNORMAL HIGH (ref 28.0–33.4)
MCHC: 33.1 g/dL (ref 32.0–35.9)
MCV: 108 fL — AB (ref 82–98)
MONO#: 0.5 10*3/uL (ref 0.1–0.9)
MONO%: 7.3 % (ref 0.0–13.0)
NEUT#: 4.4 10*3/uL (ref 1.5–6.5)
NEUT%: 69.8 % (ref 40.0–80.0)
Platelets: 227 10*3/uL (ref 145–400)
RBC: 2.62 10*6/uL — AB (ref 4.20–5.70)
RDW: 17.8 % — ABNORMAL HIGH (ref 11.1–15.7)
WBC: 6.3 10*3/uL (ref 4.0–10.0)

## 2014-11-05 LAB — CANCER ANTIGEN 19-9: CA 19-9: 31.9 U/mL (ref <35.0)

## 2014-11-05 NOTE — Progress Notes (Signed)
Hematology and Oncology Follow Up Visit  [name] [name] [id number] 1922/10/22 81 y.o. 11/05/2014   Principle Diagnosis:  Stage IV pancreatic cancer with mets to liver. Bilateral lower extremity DVT  Current Therapy:    Xeloda s/p 5 cycles  ELIQUIS 2.5 mg by mouth twice a day     Interim History:  [name] [name] is back for follow-up. He is doing okay. His weight is holding steady.  His CA 19-9 has come down nicely. Last only checked it in March it was down to 43 area did this, to me, indicates a good response.  He is eating. He does not have much taste for food. I told him to rinse his mouth out with baking soda and water. I think this would be a good combination for him.  He has had a couple falls. He is a little unsteady on his feet. He does have a rolling walker that he uses.  He is on ELIQUIS. He's on low-dose ELIQUIS. He's done well with this. He's had no diarrhea. He's had no bleeding. If he is on ELIQUIS. He's had no issues with the ELIQUIS. He's not had any problems with increased leg swelling. He does use compression stockings.   His performance status is ECOG 2.  Medications:  Current outpatient prescriptions:  .  apixaban (ELIQUIS) 5 MG TABS tablet, Take 1 tablet (5 mg total) by mouth 2 (two) times daily., Disp: , Rfl:  .  bisacodyl (DULCOLAX) 10 MG suppository, Place 1 suppository (10 mg total) rectally daily as needed for moderate constipation., Disp: 12 suppository, Rfl: 0 .  capecitabine (XELODA) 500 MG tablet, Take 3 tablets (1,500 mg total) by mouth 2 (two) times daily after a meal. TAKES 3 TABS (1500) MG BID  ///  ON 14 DAYS OFF 7 DAYS FOR 3 CYCLES.   STARTED 06/06/14, Disp: , Rfl:  .  carvedilol (COREG) 12.5 MG tablet, Take 12.5 mg by mouth 2 (two) times daily with a meal., Disp: , Rfl:  .  docusate sodium 100 MG CAPS, Take 100 mg by mouth 2 (two) times daily., Disp: 60 capsule, Rfl: 0 .  feeding supplement, GLUCERNA SHAKE, (GLUCERNA SHAKE) LIQD, Take 237 mLs by  mouth 2 (two) times daily between meals as needed (If pt with <50% meal completion)., Disp: , Rfl: 0 .  HYDROcodone-acetaminophen (NORCO) 10-325 MG per tablet, Take one tablet by mouth every 4 hours as needed for pain, Disp: 20 tablet, Rfl: 0 .  insulin glargine (LANTUS) 100 UNIT/ML injection, Inject 3 Units into the skin at bedtime., Disp: , Rfl:  .  lactulose (CHRONULAC) 10 GM/15ML solution, Take by mouth daily as needed for mild constipation., Disp: , Rfl:  .  LORazepam (ATIVAN) 0.5 MG tablet, Take 2 tablets (1 mg total) by mouth every 6 (six) hours as needed (nausea/vomiting). (Patient taking differently: Take 0.5-1 mg by mouth See admin instructions. ), Disp: 15 tablet, Rfl: 0 .  mirtazapine (REMERON) 7.5 MG tablet, Take 7.5 mg by mouth at bedtime., Disp: , Rfl:  .  Multiple Vitamin (MULTIVITAMIN WITH MINERALS) TABS tablet, Take 1 tablet by mouth daily., Disp: , Rfl:  .  Multiple Vitamins-Minerals (PRESERVISION AREDS 2 PO), Take 1 capsule by mouth 2 (two) times daily., Disp: , Rfl:  .  nitroGLYCERIN (NITROSTAT) 0.4 MG SL tablet, Place 1 tablet (0.4 mg total) under the tongue every 5 (five) minutes as needed for chest pain., Disp: 90 tablet, Rfl: 3 .  ondansetron (ZOFRAN) 8 MG tablet, Take by  mouth 2 (two) times daily as needed for nausea or vomiting. , Disp: , Rfl:  .  oxyCODONE (OXY IR/ROXICODONE) 5 MG immediate release tablet, Take one tablet by mouth twice daily for pain, Disp: 60 tablet, Rfl: 0 .  polyvinyl alcohol (LIQUIFILM TEARS) 1.4 % ophthalmic solution, Place 1 drop into both eyes 4 (four) times daily., Disp: , Rfl:  .  pravastatin (PRAVACHOL) 40 MG tablet, Take 40 mg by mouth every evening. , Disp: , Rfl:  .  prochlorperazine (COMPAZINE) 5 MG tablet, Take 5 mg by mouth every 6 (six) hours as needed for nausea or vomiting., Disp: , Rfl:  .  ranitidine (ZANTAC) 150 MG tablet, Take 150 mg by mouth 2 (two) times daily., Disp: , Rfl:  .  saccharomyces boulardii (FLORASTOR) 250 MG capsule,  Take 250 mg by mouth 2 (two) times daily., Disp: , Rfl:  .  temazepam (RESTORIL) 15 MG capsule, Take 15 mg by mouth at bedtime. , Disp: , Rfl:  .  levofloxacin (LEVAQUIN) 750 MG tablet, Take 750 mg by mouth daily., Disp: , Rfl:  .  [DISCONTINUED] Calcium Carbonate-Vitamin D (CALTRATE 600+D) 600-400 MG-UNIT per tablet, Take 1 tablet by mouth daily. , Disp: , Rfl:   Allergies: No Known Allergies  Past Medical History, Surgical history, Social history, and Family History were reviewed and updated.  Review of Systems: As above  Physical Exam:  height is 5\' 9"  (1.753 m) and weight is 170 lb (77.111 kg). His oral temperature is 97.9 F (36.6 C). His blood pressure is 128/63 and his pulse is 54. His respiration is 20.   Elderly white gentleman in no obvious distress. Head and neck exam shows no ocular or oral lesions. He has no palpable cervical or supraclavicular lymph nodes. There might be some slight temporal muscle wasting. Lungs are clear. Cardiac exam regular rate and rhythm with no murmurs, rubs or bruits. Abdomen is soft. There is no fluid wave. There is no palpable liver or spleen tip. He has no palpable abdominal mass. Back exam shows no tenderness over the spine, ribs or hips. Extremities shows 1+ edema in his lower legs. He has some osteoarthritic changes in his joints. Skin exam shows no rashes, ecchymoses or petechia. He does have some skin breakdown in the lower sacral area. Neurological exam is nonfocal.  Lab Results  Component Value Date   WBC 6.3 11/05/2014   HGB 9.4* 11/05/2014   HCT 28.4* 11/05/2014   MCV 108* 11/05/2014   PLT 227 11/05/2014     Chemistry      Component Value Date/Time   NA 141 10/08/2014 1330   NA 137 09/29/2014   NA 133* 07/22/2014 0500   K 4.6 10/08/2014 1330   K 3.8 09/29/2014   CL 100 10/08/2014 1330   CL 100 07/22/2014 0500   CO2 29 10/08/2014 1330   CO2 27 07/22/2014 0500   BUN 22 10/08/2014 1330   BUN 19 09/29/2014   BUN 22 07/22/2014 0500    CREATININE 1.0 10/08/2014 1330   CREATININE 0.9 09/29/2014   CREATININE 1.00 07/22/2014 0500   GLU 114 09/29/2014      Component Value Date/Time   CALCIUM 9.5 10/08/2014 1330   CALCIUM 8.9 07/22/2014 0500   ALKPHOS 124* 10/08/2014 1330   ALKPHOS 141* 09/29/2014   AST 21 10/08/2014 1330   AST 12* 09/29/2014   ALT 11 10/08/2014 1330   ALT 8* 09/29/2014   BILITOT 0.70 10/08/2014 1330   BILITOT 0.9 07/21/2014 0530  Impression and Plan: [name] [name] is 81 year old gentleman. He has metastatic pancreatic cancer. He is on Xeloda and tolerating this very well. His CA 19-9 has come down nicely.  We will go ahead and get him set with a CT scan when we see him back. I will get this done the same day that I see him.  He will start his next cycle of Xeloda next week.  I just want try to keep things simple for him. With his advanced age, I don't think we can get too complicated and again our focus is on his quality of life. Volanda Napoleon, MD 4/18/201611:32 AM

## 2014-11-06 ENCOUNTER — Telehealth: Payer: Self-pay | Admitting: *Deleted

## 2014-11-06 NOTE — Telephone Encounter (Signed)
I have contacted [name] [name] at [facility] to advise that per [name] Ba, PA-C, patient does not need appointment with GI for pancreatic cancer. Patient is currently seeing Dr [name] [name] (as recently as yesterday) who has a plan in place for the pancreatic cancer. We would not do anything differently then oncology. Directly after this conversation, patient's driver, [name] [name] called to inquire as to why appointment was cancelled. Unfortunately, [name] [name] is not on patient's HIPAA form for me to talk to. I was very brief but explained to her that the doctor he is already going to would be who we would divert treatment of patient's condition to. She states that they were told when patient recently began having intermittent high grade fevers, that it could be a result of biliary stent and that oncology was to get in touch with Dr [name] [name] regarding this. Per Dr [name] [name], he does not recall getting any correspondence regarding this. He has looked at chart and per recent labs and imaging, it appears that the stent is in place and functioning.

## 2014-11-07 ENCOUNTER — Ambulatory Visit: Payer: PRIVATE HEALTH INSURANCE | Admitting: Physician Assistant

## 2014-11-22 ENCOUNTER — Telehealth: Payer: Self-pay | Admitting: Hematology & Oncology

## 2014-11-22 NOTE — Telephone Encounter (Signed)
Faxed medical records to:  [facility] [address] [place], [place]  [address] F: [phone] P: [place], LPN

## 2014-11-26 ENCOUNTER — Ambulatory Visit (HOSPITAL_BASED_OUTPATIENT_CLINIC_OR_DEPARTMENT_OTHER)
Admission: RE | Admit: 2014-11-26 | Discharge: 2014-11-26 | Disposition: A | Discharge status: Home / Self Care | Payer: Medicare Other | Source: Ambulatory Visit | Attending: Hematology & Oncology | Admitting: Hematology & Oncology

## 2014-11-26 ENCOUNTER — Other Ambulatory Visit: Payer: Self-pay | Admitting: Nurse Practitioner

## 2014-11-26 ENCOUNTER — Encounter (HOSPITAL_BASED_OUTPATIENT_CLINIC_OR_DEPARTMENT_OTHER): Payer: Self-pay

## 2014-11-26 ENCOUNTER — Encounter (HOSPITAL_BASED_OUTPATIENT_CLINIC_OR_DEPARTMENT_OTHER): Payer: Medicare Other | Admitting: Hematology & Oncology

## 2014-11-26 ENCOUNTER — Ambulatory Visit (HOSPITAL_BASED_OUTPATIENT_CLINIC_OR_DEPARTMENT_OTHER): Payer: Medicare Other | Admitting: Family

## 2014-11-26 VITALS — BP 123/64 | HR 68 | Temp 97.9°F | Wt 170.0 lb

## 2014-11-26 DIAGNOSIS — C251 Malignant neoplasm of body of pancreas: Secondary | ICD-10-CM | POA: Insufficient documentation

## 2014-11-26 DIAGNOSIS — I82401 Acute embolism and thrombosis of unspecified deep veins of right lower extremity: Secondary | ICD-10-CM | POA: Diagnosis not present

## 2014-11-26 DIAGNOSIS — Z9221 Personal history of antineoplastic chemotherapy: Secondary | ICD-10-CM | POA: Insufficient documentation

## 2014-11-26 DIAGNOSIS — C787 Secondary malignant neoplasm of liver and intrahepatic bile duct: Secondary | ICD-10-CM

## 2014-11-26 DIAGNOSIS — I82409 Acute embolism and thrombosis of unspecified deep veins of unspecified lower extremity: Secondary | ICD-10-CM

## 2014-11-26 DIAGNOSIS — C259 Malignant neoplasm of pancreas, unspecified: Secondary | ICD-10-CM | POA: Diagnosis not present

## 2014-11-26 DIAGNOSIS — I82402 Acute embolism and thrombosis of unspecified deep veins of left lower extremity: Secondary | ICD-10-CM

## 2014-11-26 DIAGNOSIS — D638 Anemia in other chronic diseases classified elsewhere: Secondary | ICD-10-CM

## 2014-11-26 LAB — CBC WITH DIFFERENTIAL (CANCER CENTER ONLY)
BASO#: 0 10*3/uL (ref 0.0–0.2)
BASO%: 0.4 % (ref 0.0–2.0)
EOS%: 1.8 % (ref 0.0–7.0)
Eosinophils Absolute: 0.1 10*3/uL (ref 0.0–0.5)
HCT: 29.6 % — ABNORMAL LOW (ref 38.7–49.9)
HGB: 9.9 g/dL — ABNORMAL LOW (ref 13.0–17.1)
LYMPH#: 1.1 10*3/uL (ref 0.9–3.3)
LYMPH%: 22.5 % (ref 14.0–48.0)
MCH: 36 pg — AB (ref 28.0–33.4)
MCHC: 33.4 g/dL (ref 32.0–35.9)
MCV: 108 fL — ABNORMAL HIGH (ref 82–98)
MONO#: 0.3 10*3/uL (ref 0.1–0.9)
MONO%: 6.7 % (ref 0.0–13.0)
NEUT%: 68.6 % (ref 40.0–80.0)
NEUTROS ABS: 3.4 10*3/uL (ref 1.5–6.5)
PLATELETS: 219 10*3/uL (ref 145–400)
RBC: 2.75 10*6/uL — ABNORMAL LOW (ref 4.20–5.70)
RDW: 18.4 % — AB (ref 11.1–15.7)
WBC: 4.9 10*3/uL (ref 4.0–10.0)

## 2014-11-26 LAB — CMP (CANCER CENTER ONLY)
ALK PHOS: 92 U/L — AB (ref 26–84)
ALT: 12 U/L (ref 10–47)
AST: 23 U/L (ref 11–38)
Albumin: 3.3 g/dL (ref 3.3–5.5)
BILIRUBIN TOTAL: 0.9 mg/dL (ref 0.20–1.60)
BUN, Bld: 12 mg/dL (ref 7–22)
CO2: 29 mEq/L (ref 18–33)
Calcium: 9.1 mg/dL (ref 8.0–10.3)
Chloride: 102 mEq/L (ref 98–108)
Creat: 0.9 mg/dl (ref 0.6–1.2)
Glucose, Bld: 111 mg/dL (ref 73–118)
Potassium: 3.3 mEq/L (ref 3.3–4.7)
SODIUM: 138 meq/L (ref 128–145)
TOTAL PROTEIN: 6.9 g/dL (ref 6.4–8.1)

## 2014-11-26 LAB — CBC AND DIFFERENTIAL
HCT: 30 % — AB (ref 41–53)
HEMOGLOBIN: 9.9 g/dL — AB (ref 13.5–17.5)
Platelets: 219 10*3/uL (ref 150–399)

## 2014-11-26 LAB — CANCER ANTIGEN 19-9: CA 19-9: 31.6 U/mL (ref <35.0)

## 2014-11-26 MED ORDER — IOHEXOL 300 MG/ML  SOLN
100.0000 mL | Freq: Once | INTRAMUSCULAR | Status: AC | PRN
  Administered 2014-11-26: 100 mL via INTRAVENOUS

## 2014-11-26 NOTE — Progress Notes (Signed)
Hematology and Oncology Follow Up Visit  [name] [name] [id number] 04/20/1923 81 y.o. 11/26/2014   Principle Diagnosis:  Stage IV pancreatic cancer with mets to liver. Bilateral lower extremity DVT  Current Therapy:   Xeloda (14/7) s/p 6 cycles  ELIQUIS 2.5 mg by mouth twice a day    Interim History: [name] [name] is here today with a friend from church for a follow-up. He is feeling good and has no complaints at this time. He uses a rolling walker to ambulate.  He does have a pressure sore to his coccyx and another to her right upper back. These areas are being monitored and dressings changed daily.  He has slight swelling of the left leg but no pain. He is was running late this morning and did not put on his compression stockings. He continues to take Eliquis daily and has had no episodes of bruising or bleeding.  He denies fever, chills, n/v, cough, rash, headache, dizziness, SOB, chest pain, palpitations, constipation, diarrhea, blood in urine or stool.  He's had no tenderness, numbness or tingling in his extremities. No new aches or pains.  He is eating despite not having a taste for anything and he is staying hydrated. His weight is unchanged.  His CA 19-9, in April, was 31.9.  His CT scan this morning showed stable disease of the pancreas and liver.   Medications:    Medication List       This list is accurate as of: 11/26/14 12:16 PM.  Always use your most recent med list.               apixaban 5 MG Tabs tablet  Commonly known as:  ELIQUIS  Take 1 tablet (5 mg total) by mouth 2 (two) times daily.     bisacodyl 10 MG suppository  Commonly known as:  DULCOLAX  Place 1 suppository (10 mg total) rectally daily as needed for moderate constipation.     capecitabine 500 MG tablet  Commonly known as:  XELODA  Take 3 tablets (1,500 mg total) by mouth 2 (two) times daily after a meal. TAKES 3 TABS (1500) MG BID  ///  ON 14 DAYS OFF 7 DAYS FOR 3 CYCLES.   STARTED 06/06/14     carvedilol 12.5 MG tablet  Commonly known as:  COREG  Take 12.5 mg by mouth 2 (two) times daily with a meal.     DSS 100 MG Caps  Take 100 mg by mouth 2 (two) times daily.     feeding supplement (GLUCERNA SHAKE) Liqd  Take 237 mLs by mouth 2 (two) times daily between meals as needed (If pt with <50% meal completion).     HYDROcodone-acetaminophen 10-325 MG per tablet  Commonly known as:  NORCO  Take one tablet by mouth every 4 hours as needed for pain     insulin glargine 100 UNIT/ML injection  Commonly known as:  LANTUS  Inject 3 Units into the skin at bedtime.     lactulose 10 GM/15ML solution  Commonly known as:  [place]  Take by mouth daily as needed for mild constipation.     levofloxacin 750 MG tablet  Commonly known as:  LEVAQUIN  Take 750 mg by mouth daily.     LORazepam 0.5 MG tablet  Commonly known as:  ATIVAN  Take 2 tablets (1 mg total) by mouth every 6 (six) hours as needed (nausea/vomiting).     mirtazapine 7.5 MG tablet  Commonly known as:  REMERON  Take  7.5 mg by mouth at bedtime.     multivitamin with minerals Tabs tablet  Take 1 tablet by mouth daily.     nitroGLYCERIN 0.4 MG SL tablet  Commonly known as:  NITROSTAT  Place 1 tablet (0.4 mg total) under the tongue every 5 (five) minutes as needed for chest pain.     oxyCODONE 5 MG immediate release tablet  Commonly known as:  Oxy IR/ROXICODONE  Take one tablet by mouth twice daily for pain     polyvinyl alcohol 1.4 % ophthalmic solution  Commonly known as:  LIQUIFILM TEARS  Place 1 drop into both eyes 4 (four) times daily.     pravastatin 40 MG tablet  Commonly known as:  PRAVACHOL  Take 40 mg by mouth every evening.     PRESERVISION AREDS 2 PO  Take 1 capsule by mouth 2 (two) times daily.     prochlorperazine 5 MG tablet  Commonly known as:  COMPAZINE  Take 5 mg by mouth every 6 (six) hours as needed for nausea or vomiting.     ranitidine 150 MG tablet  Commonly known as:  ZANTAC  Take  150 mg by mouth 2 (two) times daily.     saccharomyces boulardii 250 MG capsule  Commonly known as:  FLORASTOR  Take 250 mg by mouth 2 (two) times daily.     temazepam 15 MG capsule  Commonly known as:  RESTORIL  Take 15 mg by mouth at bedtime.     ZOFRAN 8 MG tablet  Generic drug:  ondansetron  Take by mouth 2 (two) times daily as needed for nausea or vomiting.        Allergies: No Known Allergies  Past Medical History, Surgical history, Social history, and Family History were reviewed and updated.  Review of Systems: All other 10 point review of systems is negative.   Physical Exam:  vitals were not taken for this visit.  Wt Readings from Last 3 Encounters:  11/05/14 170 lb (77.111 kg)  10/08/14 169 lb (76.658 kg)  09/17/14 168 lb (76.204 kg)    Ocular: Sclerae unicteric, pupils equal, round and reactive to light Ear-nose-throat: Oropharynx clear, dentition fair Lymphatic: No cervical or supraclavicular adenopathy Lungs no rales or rhonchi, good excursion bilaterally Heart regular rate and rhythm, no murmur appreciated Abd soft, nontender, positive bowel sounds MSK no focal spinal tenderness, no joint edema Neuro: non-focal, well-oriented, appropriate affect  Lab Results  Component Value Date   WBC 6.3 11/05/2014   HGB 9.4* 11/05/2014   HCT 28.4* 11/05/2014   MCV 108* 11/05/2014   PLT 227 11/05/2014   No results found for: FERRITIN, IRON, TIBC, UIBC, IRONPCTSAT Lab Results  Component Value Date   RBC 2.62* 11/05/2014   No results found for: KPAFRELGTCHN, LAMBDASER, KAPLAMBRATIO No results found for: IGGSERUM, IGA, IGMSERUM No results found for: [name] [name], SPEI   Chemistry      Component Value Date/Time   NA 140 11/05/2014 1043   NA 141 10/08/2014 1330   NA 137 09/29/2014   K 3.9 11/05/2014 1043   K 4.6 10/08/2014 1330   CL 105 11/05/2014 1043   CL 100 10/08/2014 1330   CO2 26 11/05/2014 1043    CO2 29 10/08/2014 1330   BUN 15 11/05/2014 1043   BUN 22 10/08/2014 1330   BUN 19 09/29/2014   CREATININE 0.90 11/05/2014 1043   CREATININE 1.0 10/08/2014 1330   CREATININE 0.9 09/29/2014   GLU  114 09/29/2014      Component Value Date/Time   CALCIUM 8.5 11/05/2014 1043   CALCIUM 9.5 10/08/2014 1330   ALKPHOS 94 11/05/2014 1043   ALKPHOS 124* 10/08/2014 1330   AST 18 11/05/2014 1043   AST 21 10/08/2014 1330   ALT 8 11/05/2014 1043   ALT 11 10/08/2014 1330   BILITOT 0.8 11/05/2014 1043   BILITOT 0.70 10/08/2014 1330     Impression and Plan: Mr. Ramsay is 81 year old gentleman with metastatic pancreatic cancer. He is on Xeloda and tolerating this well. His CT scan this morning showed stable disease.  He will continue on Xeloda with the same schedule (14/7).  His CA-19-9 continues to come down nicely. We will see him back in 3 weeks for labs and follow-up.  He and his friend know to contact us with any questions or concerns. We can certainly see him sooner if need be.   Eliezer Bottom, NP 5/9/201612:16 PM

## 2014-11-28 ENCOUNTER — Non-Acute Institutional Stay (SKILLED_NURSING_FACILITY): Payer: Medicare Other | Admitting: Nurse Practitioner

## 2014-11-28 ENCOUNTER — Encounter: Payer: Self-pay | Admitting: Nurse Practitioner

## 2014-11-28 DIAGNOSIS — K589 Irritable bowel syndrome without diarrhea: Secondary | ICD-10-CM

## 2014-11-28 DIAGNOSIS — I82402 Acute embolism and thrombosis of unspecified deep veins of left lower extremity: Secondary | ICD-10-CM

## 2014-11-28 DIAGNOSIS — D638 Anemia in other chronic diseases classified elsewhere: Secondary | ICD-10-CM

## 2014-11-28 DIAGNOSIS — E1122 Type 2 diabetes mellitus with diabetic chronic kidney disease: Secondary | ICD-10-CM | POA: Diagnosis not present

## 2014-11-28 DIAGNOSIS — S63659A Sprain of metacarpophalangeal joint of unspecified finger, initial encounter: Secondary | ICD-10-CM | POA: Diagnosis not present

## 2014-11-28 DIAGNOSIS — G4700 Insomnia, unspecified: Secondary | ICD-10-CM | POA: Diagnosis not present

## 2014-11-28 DIAGNOSIS — I10 Essential (primary) hypertension: Secondary | ICD-10-CM

## 2014-11-28 DIAGNOSIS — G609 Hereditary and idiopathic neuropathy, unspecified: Secondary | ICD-10-CM

## 2014-11-28 DIAGNOSIS — K219 Gastro-esophageal reflux disease without esophagitis: Secondary | ICD-10-CM | POA: Diagnosis not present

## 2014-11-28 DIAGNOSIS — I5022 Chronic systolic (congestive) heart failure: Secondary | ICD-10-CM | POA: Diagnosis not present

## 2014-11-28 DIAGNOSIS — C259 Malignant neoplasm of pancreas, unspecified: Secondary | ICD-10-CM | POA: Diagnosis not present

## 2014-11-28 DIAGNOSIS — N189 Chronic kidney disease, unspecified: Secondary | ICD-10-CM | POA: Diagnosis not present

## 2014-11-28 DIAGNOSIS — S66812A Strain of other specified muscles, fascia and tendons at wrist and hand level, left hand, initial encounter: Secondary | ICD-10-CM

## 2014-11-28 NOTE — Assessment & Plan Note (Signed)
11/26/14 Hgb 9.9

## 2014-11-28 NOTE — Progress Notes (Signed)
Patient ID: [name] [name], male   DOB: [birth year], 81 y.o.   MRN: [id number]   Code Status: DNR  No Known Allergies  Chief Complaint  Patient presents with  . Medical Management of Chronic Issues  . Acute Visit    left finger contusion.     HPI: Patient is a 81 y.o. male seen in the SNF at [facility] today for evaluation of left 3rd finger MCP painful swelling and other chronic medical conditions.  Problem List Items Addressed This Visit    Type 2 diabetes mellitus (Chronic)    12/20/13 Hgb A1c 7.3 04/09/14 CBG am 150-180, pm 186/235-adding Lantus 3 units qd qhs.  05/28/14 update Hgb A1c 7.1 06/27/14 continue Lantus 3 units daily.       Hereditary and idiopathic peripheral neuropathy (Chronic)    Takes Pregabalin 49m daily. Ambulates with walker on unit       HYPERTENSION, BENIGN (Chronic)    Blood pressure controlled on Coreg 141.3KGbid       SYSTOLIC HEART FAILURE, CHRONIC (Chronic)    Compensated clinically. Trace ankle edema.         Anemia of chronic disease - Primary (Chronic)    11/26/14 Hgb 9.9       Pancreatic cancer (Chronic)    F/u oncology-stable.       DVT (deep venous thrombosis) (Chronic)    Continue Eliquis 527mbid.        GERD    Takes ranitidine 15056mid. Stable.        IBS    Managed with Colace 100m30md, Dulxolax 10mg54mpository prn daily, Enulose 30mg 72my prn, MiraLax daily prn.        Insomnia    C/o sleeps too much, change Temazepam 15mg q36mo prn and Lorazepam 0.5mg qhs61mr sleep. The patient stated still cannot sleep at night-improved since 08/13/14 Mirtazapine 7.5mg po q76m  Stable, continue Mirtazapine 7.5mg night48mand prn Temazepam 15mg hs an53mrazepam 0.5mg qhs and55mg q6h prn.41m       Strain, MCP, hand, left    Left 3rd MCP swelling, warmth, painful from accidental hitting hand on the side rail of bed. Will apply Aspercreme qid x 10 days-may consider uric acid, ESR, and RAF checking.           Review of Systems:  Review of Systems  Constitutional: Positive for fatigue. Negative for fever, chills, diaphoresis, activity change and appetite change.  HENT: Positive for hearing loss. Negative for congestion, ear discharge, ear pain, nosebleeds, sore throat and tinnitus.   Eyes: Negative for photophobia, pain, discharge and redness.  Respiratory: Negative for cough, shortness of breath, wheezing and stridor.   Cardiovascular: Positive for leg swelling. Negative for chest pain and palpitations.       Trace ankles.   Gastrointestinal: Negative for nausea, vomiting, abdominal pain, diarrhea, constipation and blood in stool.  Endocrine: Negative for polydipsia.  Genitourinary: Positive for frequency. Negative for dysuria, urgency, hematuria and flank pain.  Musculoskeletal: Negative for myalgias, back pain and neck pain.  Skin: Negative for rash.       Left great toe nail came off R upper chest Pot A Cath intact.  The left 3rd MCP swelling, tenderness, and warmth  Allergic/Immunologic: Negative for environmental allergies.  Neurological: Negative for dizziness, tremors, seizures, weakness and headaches.  Hematological: Does not bruise/bleed easily.  Psychiatric/Behavioral: Negative for suicidal ideas and hallucinations. The patient is not nervous/anxious.  Past Medical History  Diagnosis Date  . Diverticulosis of colon (without mention of hemorrhage)   . Irritable bowel syndrome   . Personal history of malignant neoplasm of prostate     s/p  radioactive seed implants 2000  . Unspecified hereditary and idiopathic peripheral neuropathy   . Nonischemic cardiomyopathy cardiologist-  dr Aundra Dubin    EF 25%  per last echo 2012 and cardiologist note--  2007-  ef  40-45%;  2008- ef 35-40%;  2010- ef 30-35%;  2012- ef 25%  . Sinus bradycardia   . Hypertension   . Arthritis     hands  . Hyperlipidemia   . History of DVT of lower extremity     2002  ,   left leg  . History of  adenomatous polyp of colon     2001  . History of basal cell carcinoma excision   . Anticoagulated on Coumadin   . Type 2 diabetes, diet controlled   . GERD (gastroesophageal reflux disease)   . LBBB (left bundle branch block)   . Wears glasses   . Wears hearing aid     BILATERAL  . Unsteady gait   . DDD (degenerative disc disease)     cervical and lumbar  . DJD (degenerative joint disease)     hips  . Lower urinary tract symptoms (LUTS)   . 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 Coronary artery disease     per cath 2002  non-obstructive cad  . Chronic systolic heart failure   . H/O fracture of nose     12-30-2013-  pt fell at home open nose bone fx  . Abdominal pain 12/20/2013  . Abnormal MRI of abdomen 01/31/2014  . Acute encephalopathy 07/02/2014  . Acute thromboembolism of deep veins of lower extremity 05/03/2007    Qualifier: History of  By: Doy Mince LPN, Megan  16/10/96 Doppler LLE: DVT at left femoral and popliteal veins not completely occlusive. Will anticoagulate with Eliquis 2.56m bid.    . Anemia of chronic disease 02/20/2013  . CARDIOMYOPATHY 07/06/2007    Qualifier: Diagnosis of  By: NLenna GilfordMD, SDeborra Medina  . Constipation 04/04/2014  . Coronary atherosclerosis 07/06/2007    Qualifier: Diagnosis of  By: NLenna GilfordMD, SLa HuertaDISEASE 05/03/2007    Qualifier: Diagnosis of  By: RDoy MinceLPN, Megan    . DVT (deep venous thrombosis)   . Edema 05/28/2014  . Failure to thrive in adult 03/30/2014  . Fall in elderly patient 12/30/2013  . Fever 07/20/2014  . Frailty 03/30/2014  . FUO (fever of unknown origin) 05/28/2014    05/28/14 CXR right lower lobe bronchopneumonia.    .Marland KitchenGERD 05/03/2007    Qualifier: Diagnosis of  By: RDoy MinceLPN, Megan    . Hereditary and idiopathic peripheral neuropathy 07/06/2007    Qualifier: Diagnosis of  By: NLenna GilfordMD, SDeborra Medina  . HYPERLIPIDEMIA  05/03/2007    Qualifier: Diagnosis of  By: RDoy MinceLPN, Megan    . HYPERTENSION, BENIGN 06/23/2010    Qualifier: Diagnosis of  By: BOlevia Perches MD, FGlenetta Hew  . Insomnia 04/04/2014  . Liver metastasis 04/01/2014  . Malnutrition of moderate degree 03/30/2014    05/01/14 albumin 2.8 05/29/14 albumin 3.0   . Metastatic cancer 03/29/2014  . Normocytic anemia 03/30/2014    04/10/14 Hgb 9.3   . Pancreatic cancer  04/06/2014    06/06/14 Oncology: stage IV pancreatic cancer with Mets to liver: CA 19-9 400--194. CT showed more liver metastases. Xeloda 1572m bid x 14days on and 7 days off. 3 cycles of tx and then repeat the scans.    . Pressure ulcer stage II 08/15/2014    08/14/14 L scrotum small PU-not infected-tx initiated.    . Protein-calorie malnutrition, severe 07/05/2014  . SPINAL STENOSIS, LUMBAR 07/06/2007    Qualifier: Diagnosis of  By: NLenna GilfordMD, SDeborra Medina  . Streptococcal bacteremia 07/23/2014  . Transaminitis 03/30/2014    04/10/14 Alkaline phosphatase 126(39-117) 05/01/14 AST 33, ALT 45, alk  Phos 75 05/29/14 AST 64, ALT 70 alk phos 209   . Type 2 diabetes mellitus 05/03/2007    Qualifier: Diagnosis of  By: RDoy MinceLPN, Megan  144/92/01Hgb A1c 7.1    . Weakness generalized 04/02/2014   Past Surgical History  Procedure Laterality Date  . Knee arthroscopy Left 1991  . Ercp with sphincterotomy and stone removal  05/ 1993  &  05-11-2006  . Total knee arthroplasty  10/05/2011    Procedure: TOTAL KNEE ARTHROPLASTY;  Surgeon: FGearlean Alf MD;  Location: [facility];  Service: Orthopedics;  Laterality: Right;  . Transurethral resection of bladder tumor N/A 09/30/2012    Procedure: TRANSURETHRAL RESECTION OF BLADDER TUMOR (TURBT);  Surgeon: SFranchot Gallo MD;  Location: [facility];  Service: Urology;  Laterality: N/A;  WITH MITOMYCIN INSTILLATION   . Eus N/A 01/04/2014    Procedure: ESOPHAGEAL ENDOSCOPIC ULTRASOUND (EUS) RADIAL;  Surgeon: DMilus Banister MD;  Location: [facility];  Service:  Endoscopy;  Laterality: N/A;  . Radioactive prostate seed implants  01/ 2000  . Mohs surgery  03/ 2009    LEFT EAR BASAL CELL CARCINOMA  . Tonsillectomy  AS CHILD  . Cholecystectomy open  1985  . Appendectomy  child  . Inguinal hernia repair Bilateral 1998    recurrent RIH  w/ repair 06-21-2006  . Total knee arthroplasty Left 02-21-2007    AND 04-21-2007  CLOSED MANIPULATION  . Cataract extraction w/ intraocular lens implant Right 2004  . Lumbar spine surgery  X2  . Cardiac catheterization  06-14-2001  dr bDarnell Levelbrodie    non-obstructive CAD/  LM 30%,  pLAD 50%,  mLAD 30%,  RCA  &  CFX  irregularites ,  global hypokinesis,  ef 40%  . Cardiovascular stress test  02-07-2007   dr bDarnell Levelbrodie    negative for ischemia,  low risk myoview perfusion study  . Transthoracic echocardiogram  01-28-2011    moderate LVH/  ef 25%/  diffuse hypokinesis/  mild LAE/  mild TR/  trivial AR & MR  . Cystoscopy w/ retrogrades Bilateral 03/09/2014    Procedure: CYSTOSCOPY WITH BILATERAL RETROGRADE PYELOGRAM BLADDER BX AND RENAL WASHINGS.;  Surgeon: SJorja Loa MD;  [facility]  Service: Urology;  Laterality: Bilateral;  . Eus N/A 04/05/2014    Procedure: UPPER ENDOSCOPIC ULTRASOUND (EUS) LINEAR;  Surgeon: DMilus Banister MD;  Location: [facility];  Service: Endoscopy;  Laterality: N/A;  radial linear  . Endoscopic retrograde cholangiopancreatography (ercp) with propofol N/A 04/12/2014    Procedure: ENDOSCOPIC RETROGRADE CHOLANGIOPANCREATOGRAPHY (ERCP) WITH PROPOFOL;  Surgeon: DMilus Banister MD;  Location: [facility];  Service: Endoscopy;  Laterality: N/A;  metal stent  . Biliary stent placement N/A 04/12/2014    Procedure: BILIARY STENT PLACEMENT;  Surgeon: DMilus Banister MD;  Location: [facility];  Service: Endoscopy;  Laterality: N/A;   Social History:   reports that he quit smoking about 32 years ago. His smoking use included Cigarettes. He started smoking about 60  years ago. He has a 10 pack-year smoking history. He has never used smokeless tobacco. He reports that he does not drink alcohol or use illicit drugs.  Family History  Problem Relation Age of Onset  . Throat cancer Brother   . Coronary artery disease Father     deceased    Medications: Patient's Medications  New Prescriptions   No medications on file  Previous Medications   APIXABAN (ELIQUIS) 5 MG TABS TABLET    Take 1 tablet (5 mg total) by mouth 2 (two) times daily.   BISACODYL (DULCOLAX) 10 MG SUPPOSITORY    Place 1 suppository (10 mg total) rectally daily as needed for moderate constipation.   CAPECITABINE (XELODA) 500 MG TABLET    Take 3 tablets (1,500 mg total) by mouth 2 (two) times daily after a meal. TAKES 3 TABS (1500) MG BID  ///  ON 14 DAYS OFF 7 DAYS FOR 3 CYCLES.   STARTED 06/06/14   CARVEDILOL (COREG) 12.5 MG TABLET    Take 12.5 mg by mouth 2 (two) times daily with a meal.   DOCUSATE SODIUM 100 MG CAPS    Take 100 mg by mouth 2 (two) times daily.   FEEDING SUPPLEMENT, GLUCERNA SHAKE, (GLUCERNA SHAKE) LIQD    Take 237 mLs by mouth 2 (two) times daily between meals as needed (If pt with <50% meal completion).   HYDROCODONE-ACETAMINOPHEN (NORCO) 10-325 MG PER TABLET    Take one tablet by mouth every 4 hours as needed for pain   INSULIN GLARGINE (LANTUS) 100 UNIT/ML INJECTION    Inject 3 Units into the skin at bedtime.   LACTULOSE (CHRONULAC) 10 GM/15ML SOLUTION    Take by mouth daily as needed for mild constipation.   LEVOFLOXACIN (LEVAQUIN) 750 MG TABLET    Take 750 mg by mouth daily.   LORAZEPAM (ATIVAN) 0.5 MG TABLET    Take 2 tablets (1 mg total) by mouth every 6 (six) hours as needed (nausea/vomiting).   MIRTAZAPINE (REMERON) 7.5 MG TABLET    Take 7.5 mg by mouth at bedtime.   MULTIPLE VITAMIN (MULTIVITAMIN WITH MINERALS) TABS TABLET    Take 1 tablet by mouth daily.   MULTIPLE VITAMINS-MINERALS (PRESERVISION AREDS 2 PO)    Take 1 capsule by mouth 2 (two) times daily.    NITROGLYCERIN (NITROSTAT) 0.4 MG SL TABLET    Place 1 tablet (0.4 mg total) under the tongue every 5 (five) minutes as needed for chest pain.   ONDANSETRON (ZOFRAN) 8 MG TABLET    Take by mouth 2 (two) times daily as needed for nausea or vomiting.    OXYCODONE (OXY IR/ROXICODONE) 5 MG IMMEDIATE RELEASE TABLET    Take one tablet by mouth twice daily for pain   POLYVINYL ALCOHOL (LIQUIFILM TEARS) 1.4 % OPHTHALMIC SOLUTION    Place 1 drop into both eyes 4 (four) times daily.   PRAVASTATIN (PRAVACHOL) 40 MG TABLET    Take 40 mg by mouth every evening.    PROCHLORPERAZINE (COMPAZINE) 5 MG TABLET    Take 5 mg by mouth every 6 (six) hours as needed for nausea or vomiting.   RANITIDINE (ZANTAC) 150 MG TABLET    Take 150 mg by mouth 2 (two) times daily.   SACCHAROMYCES BOULARDII (FLORASTOR) 250 MG CAPSULE    Take 250 mg by mouth 2 (two)  times daily.   TEMAZEPAM (RESTORIL) 15 MG CAPSULE    Take 15 mg by mouth at bedtime.   Modified Medications   No medications on file  Discontinued Medications   No medications on file     Physical Exam: Physical Exam  Constitutional: He is oriented to person, place, and time. He appears well-developed and well-nourished.  HENT:  Head: Normocephalic and atraumatic.  Mouth/Throat: Oropharynx is clear and moist.  Eyes: Conjunctivae and EOM are normal. Pupils are equal, round, and reactive to light. Right eye exhibits no discharge. Left eye exhibits no discharge. No scleral icterus.  Neck: Normal range of motion. Neck supple. No JVD present. No tracheal deviation present. No thyromegaly present.  Cardiovascular: Normal rate, regular rhythm and normal heart sounds.   Pulmonary/Chest: Effort normal and breath sounds normal. No stridor. He has no wheezes. He has no rales. He exhibits no tenderness.  Abdominal: Soft. Bowel sounds are normal. He exhibits no distension. There is no tenderness. There is no rebound and no guarding.  Minimal epigastric tenderness.  No ascites    Musculoskeletal: Normal range of motion. He exhibits edema. He exhibits no tenderness.  LLE  Lymphadenopathy:    He has no cervical adenopathy.  Neurological: He is alert and oriented to person, place, and time. No cranial nerve deficit.  Skin: Skin is warm and dry. No rash noted. No erythema. No pallor.  The left 3rd MCP swelling, tenderness, and warmth  Psychiatric: He has a normal mood and affect. His behavior is normal.    Filed Vitals:   11/28/14 1321  BP: 116/62  Pulse: 76  Temp: 98.4 F (36.9 C)  TempSrc: Tympanic  Resp: 20      Labs reviewed: Basic Metabolic Panel:  Recent Labs  05/29/14  07/20/14 0500  10/08/14 1330 11/05/14 1043 11/26/14 1156  NA 136*  < > 137  < > 141 140 138  K 4.3  < > 4.0  < > 4.6 3.9 3.3  CL  --   < > 103  < > 100 105 102  CO2  --   < > 27  < > 29 26 29   GLUCOSE  --   < > 122*  < > 117 176* 111  BUN 22*  < > 21  < > 22 15 12   CREATININE 1.0  < > 0.99  < > 1.0 0.90 0.9  CALCIUM  --   < > 8.8  < > 9.5 8.5 9.1  TSH 1.15  --  0.846  --   --   --   --   < > = values in this interval not displayed. Liver Function Tests:  Recent Labs  07/20/14 0500 07/21/14 0530  10/08/14 1330 11/05/14 1043 11/26/14 1156  AST 57* 32  < > 21 18 23   ALT 40 30  < > 11 8 12   ALKPHOS 240* 194*  < > 124* 94 92*  BILITOT 0.9 0.9  < > 0.70 0.8 0.90  PROT 6.1 6.2  < > 7.0 5.8* 6.9  ALBUMIN 3.1* 3.0*  --   --  3.1*  --   < > = values in this interval not displayed.  Recent Labs  12/19/13 1514 03/29/14 1610 07/02/14 1845  LIPASE 8.0* 10* 7*   No results for input(s): AMMONIA in the last 8760 hours. CBC:  Recent Labs  10/08/14 1330  11/05/14 1043 11/26/14 11/26/14 1155  WBC 4.8  --  6.3  --  4.9  NEUTROABS  2.9  --  4.4  --  3.4  HGB 9.5*  < > 9.4* 9.9* 9.9*  HCT 28.6*  < > 28.4* 30* 29.6*  MCV 108*  --  108*  --  108*  PLT 193  < > 227 219 219  < > = values in this interval not displayed. Lipid Panel: No results for input(s): CHOL, HDL,  LDLCALC, TRIG, CHOLHDL, LDLDIRECT in the last 8760 hours.  Past Procedures:  03/29/14 CT abd and pelvis w CM:  IMPRESSION:  4.9 cm enhancing lesion adjacent to the pancreatic head/uncinate  process, worrisome for primary pancreatic adenocarcinoma,  progressed.  Tumor likely involves the SMA/SMV and undersurface of the portal  vein. 9 mm short axis node in the porta hepatitis.  Suspected multifocal hepatic metastases, including a 3.2 cm lesion  in the right hepatic dome.   03/30/14 MR w wo CM abd:   IMPRESSION:  1. Subtle amorphous mass in the pancreatic head and proximal  pancreatic body currently measuring 4.4 x 3.2 cm, causing distal  common bile duct obstruction and demonstrating vascular involvement  (SMA, SMV, splenic vein, splenic portal confluence and proximal  portal vein), as above, highly concerning for primary pancreatic  neoplasm.  2. Although there do appear to be multiple ill-defined hepatic  lesions, concerning for metastatic disease, these were poorly  evaluated on today's examination secondary to patient respiratory  motion. Continued attention on any future followup studies is  recommended, as metastatic disease to the liver is suspected.  3. Severe intra and extrahepatic biliary ductal dilatation.   05/04/14 Doppler LLE: DVT at left femoral and popliteal veins not completely occlusive. Will anticoagulate with Eliquis 2.72m bid.    07/20/14 CT abd and pelvis:   IMPRESSION: 1. New tiny pleural effusions with adjacent atelectasis/ infiltrate. 2. Stable poorly marginated hepatic lesions suggesting metastatic disease. 3. Little change in pancreatic mass with patent metallic biliary stent and pneumobilia.  11/26/14 CT abd and pelvis with contrast:  1 no definite new metastatic lesions in the liver and lack of visualization of the previously treated metastatic lesions in the liver 2 small area of hypervascularity adjacent to the gallbladder  fossa in the liver is favored to represent a perfusion anomaly.  3 persistent mass like thickening of the distal rectum immedialtely above the anorectal junction-concerning for potential neoplasm.  4 extensive colonic diverticulosis  5 patent stent in the common bile duct  11/28/14 X-ray Left hand: no acute dislocation or fracture in the left hand.   Assessment/Plan Anemia of chronic disease 11/26/14 Hgb 9.9    Strain, MCP, hand, left Left 3rd MCP swelling, warmth, painful from accidental hitting hand on the side rail of bed. Will apply Aspercreme qid x 10 days-may consider uric acid, ESR, and RAF checking.    Type 2 diabetes mellitus 12/20/13 Hgb A1c 7.3 04/09/14 CBG am 150-180, pm 186/235-adding Lantus 3 units qd qhs.  05/28/14 update Hgb A1c 7.1 06/27/14 continue Lantus 3 units daily.    Hereditary and idiopathic peripheral neuropathy Takes Pregabalin 773mdaily. Ambulates with walker on unit    HYPERTENSION, BENIGN Blood pressure controlled on Coreg 1246.5KCid    SYSTOLIC HEART FAILURE, CHRONIC Compensated clinically. Trace ankle edema.      Pancreatic cancer F/u oncology-stable.    DVT (deep venous thrombosis) Continue Eliquis 42m642mid.     GERD Takes ranitidine 150m80md. Stable.     IBS Managed with Colace 100mg51m, Dulxolax 10mg 68mository prn daily, Enulose 30mg d2m prn, MiraLax  daily prn.     Insomnia C/o sleeps too much, change Temazepam 33m qhs to prn and Lorazepam 0.537mqhs for sleep. The patient stated still cannot sleep at night-improved since 08/13/14 Mirtazapine 7.51m97mo qhs.  Stable, continue Mirtazapine 7.51mg1mghtly and prn Temazepam 151mg56mand Lorazepam 0.51mg q107mand 1mg q669mrn.         Family/ Staff Communication: observe the patient  Goals of Care: SNF  Labs/tests ordered: x-ray left hand 11/28/14

## 2014-11-28 NOTE — Progress Notes (Signed)
Opened by accident

## 2014-11-29 DIAGNOSIS — S66812A Strain of other specified muscles, fascia and tendons at wrist and hand level, left hand, initial encounter: Secondary | ICD-10-CM | POA: Insufficient documentation

## 2014-11-29 NOTE — Assessment & Plan Note (Signed)
Managed with Colace 100mg  bid, Dulxolax 10mg  suppository prn daily, Enulose 30mg  daily prn, MiraLax daily prn.

## 2014-11-29 NOTE — Assessment & Plan Note (Signed)
C/o sleeps too much, change Temazepam 15mg  qhs to prn and Lorazepam 0.5mg  qhs for sleep. The patient stated still cannot sleep at night-improved since 08/13/14 Mirtazapine 7.5mg  po qhs.  Stable, continue Mirtazapine 7.5mg  nightly and prn Temazepam 15mg  hs and Lorazepam 0.5mg  qhs and 1mg  q6h prn.

## 2014-11-29 NOTE — Assessment & Plan Note (Signed)
Continue Eliquis 5 mg b.i.d..

## 2014-11-29 NOTE — Assessment & Plan Note (Signed)
Compensated clinically. Trace ankle edema.

## 2014-11-29 NOTE — Assessment & Plan Note (Signed)
Takes Pregabalin 75mg  daily. Ambulates with walker on unit

## 2014-11-29 NOTE — Assessment & Plan Note (Signed)
F/u oncology-stable.

## 2014-11-29 NOTE — Assessment & Plan Note (Signed)
Takes ranitidine 150mg  bid. Stable.

## 2014-11-29 NOTE — Assessment & Plan Note (Signed)
12/20/13 Hgb A1c 7.3 04/09/14 CBG am 150-180, pm 186/235-adding Lantus 3 units qd qhs.  05/28/14 update Hgb A1c 7.1 06/27/14 continue Lantus 3 units daily.

## 2014-11-29 NOTE — Assessment & Plan Note (Signed)
Left 3rd MCP swelling, warmth, painful from accidental hitting hand on the side rail of bed. Will apply Aspercreme qid x 10 days-may consider uric acid, ESR, and RAF checking.

## 2014-11-29 NOTE — Assessment & Plan Note (Signed)
Blood pressure controlled on Coreg 12.5mg  bid

## 2014-12-03 ENCOUNTER — Other Ambulatory Visit: Payer: Self-pay | Admitting: *Deleted

## 2014-12-03 MED ORDER — HYDROCODONE-ACETAMINOPHEN 10-325 MG PO TABS: ORAL_TABLET | ORAL | Status: AC

## 2014-12-03 NOTE — Telephone Encounter (Signed)
[facility] 

## 2014-12-14 ENCOUNTER — Emergency Department (HOSPITAL_COMMUNITY): Payer: Medicare Other

## 2014-12-14 ENCOUNTER — Encounter (HOSPITAL_COMMUNITY): Payer: Self-pay | Admitting: Emergency Medicine

## 2014-12-14 ENCOUNTER — Inpatient Hospital Stay (HOSPITAL_COMMUNITY)
Admission: EM | Admit: 2014-12-14 | Discharge: 2014-12-18 | DRG: 435 | Disposition: A | Discharge status: Skilled Nursing Facility | Payer: Medicare Other | Attending: Internal Medicine | Admitting: Internal Medicine

## 2014-12-14 DIAGNOSIS — R6510 Systemic inflammatory response syndrome (SIRS) of non-infectious origin without acute organ dysfunction: Secondary | ICD-10-CM | POA: Diagnosis present

## 2014-12-14 DIAGNOSIS — Z96653 Presence of artificial knee joint, bilateral: Secondary | ICD-10-CM | POA: Diagnosis present

## 2014-12-14 DIAGNOSIS — Z85828 Personal history of other malignant neoplasm of skin: Secondary | ICD-10-CM | POA: Diagnosis not present

## 2014-12-14 DIAGNOSIS — Z8546 Personal history of malignant neoplasm of prostate: Secondary | ICD-10-CM

## 2014-12-14 DIAGNOSIS — L89152 Pressure ulcer of sacral region, stage 2: Secondary | ICD-10-CM | POA: Diagnosis present

## 2014-12-14 DIAGNOSIS — Z79899 Other long term (current) drug therapy: Secondary | ICD-10-CM | POA: Diagnosis not present

## 2014-12-14 DIAGNOSIS — E119 Type 2 diabetes mellitus without complications: Secondary | ICD-10-CM | POA: Diagnosis present

## 2014-12-14 DIAGNOSIS — I82409 Acute embolism and thrombosis of unspecified deep veins of unspecified lower extremity: Secondary | ICD-10-CM | POA: Diagnosis present

## 2014-12-14 DIAGNOSIS — Z66 Do not resuscitate: Secondary | ICD-10-CM | POA: Diagnosis present

## 2014-12-14 DIAGNOSIS — L8992 Pressure ulcer of unspecified site, stage 2: Secondary | ICD-10-CM | POA: Diagnosis present

## 2014-12-14 DIAGNOSIS — R0602 Shortness of breath: Secondary | ICD-10-CM

## 2014-12-14 DIAGNOSIS — D72819 Decreased white blood cell count, unspecified: Secondary | ICD-10-CM | POA: Diagnosis present

## 2014-12-14 DIAGNOSIS — Z8551 Personal history of malignant neoplasm of bladder: Secondary | ICD-10-CM | POA: Diagnosis not present

## 2014-12-14 DIAGNOSIS — G9340 Encephalopathy, unspecified: Secondary | ICD-10-CM | POA: Diagnosis not present

## 2014-12-14 DIAGNOSIS — I959 Hypotension, unspecified: Secondary | ICD-10-CM | POA: Diagnosis present

## 2014-12-14 DIAGNOSIS — Z808 Family history of malignant neoplasm of other organs or systems: Secondary | ICD-10-CM

## 2014-12-14 DIAGNOSIS — I82402 Acute embolism and thrombosis of unspecified deep veins of left lower extremity: Secondary | ICD-10-CM

## 2014-12-14 DIAGNOSIS — Z8249 Family history of ischemic heart disease and other diseases of the circulatory system: Secondary | ICD-10-CM | POA: Diagnosis not present

## 2014-12-14 DIAGNOSIS — I2510 Atherosclerotic heart disease of native coronary artery without angina pectoris: Secondary | ICD-10-CM | POA: Diagnosis present

## 2014-12-14 DIAGNOSIS — I5022 Chronic systolic (congestive) heart failure: Secondary | ICD-10-CM | POA: Diagnosis present

## 2014-12-14 DIAGNOSIS — Z79891 Long term (current) use of opiate analgesic: Secondary | ICD-10-CM

## 2014-12-14 DIAGNOSIS — I82403 Acute embolism and thrombosis of unspecified deep veins of lower extremity, bilateral: Secondary | ICD-10-CM

## 2014-12-14 DIAGNOSIS — Z86718 Personal history of other venous thrombosis and embolism: Secondary | ICD-10-CM | POA: Diagnosis not present

## 2014-12-14 DIAGNOSIS — R509 Fever, unspecified: Secondary | ICD-10-CM

## 2014-12-14 DIAGNOSIS — Z87891 Personal history of nicotine dependence: Secondary | ICD-10-CM

## 2014-12-14 DIAGNOSIS — E785 Hyperlipidemia, unspecified: Secondary | ICD-10-CM | POA: Diagnosis present

## 2014-12-14 DIAGNOSIS — I429 Cardiomyopathy, unspecified: Secondary | ICD-10-CM | POA: Diagnosis present

## 2014-12-14 DIAGNOSIS — Z794 Long term (current) use of insulin: Secondary | ICD-10-CM | POA: Diagnosis not present

## 2014-12-14 DIAGNOSIS — R0902 Hypoxemia: Secondary | ICD-10-CM | POA: Diagnosis present

## 2014-12-14 DIAGNOSIS — D6959 Other secondary thrombocytopenia: Secondary | ICD-10-CM | POA: Diagnosis present

## 2014-12-14 DIAGNOSIS — K589 Irritable bowel syndrome without diarrhea: Secondary | ICD-10-CM | POA: Diagnosis present

## 2014-12-14 DIAGNOSIS — Z9049 Acquired absence of other specified parts of digestive tract: Secondary | ICD-10-CM | POA: Diagnosis present

## 2014-12-14 DIAGNOSIS — Z7901 Long term (current) use of anticoagulants: Secondary | ICD-10-CM

## 2014-12-14 DIAGNOSIS — Z8739 Personal history of other diseases of the musculoskeletal system and connective tissue: Secondary | ICD-10-CM

## 2014-12-14 DIAGNOSIS — H9190 Unspecified hearing loss, unspecified ear: Secondary | ICD-10-CM | POA: Diagnosis present

## 2014-12-14 DIAGNOSIS — I447 Left bundle-branch block, unspecified: Secondary | ICD-10-CM | POA: Diagnosis present

## 2014-12-14 DIAGNOSIS — C252 Malignant neoplasm of tail of pancreas: Secondary | ICD-10-CM | POA: Diagnosis present

## 2014-12-14 DIAGNOSIS — R410 Disorientation, unspecified: Secondary | ICD-10-CM

## 2014-12-14 DIAGNOSIS — I10 Essential (primary) hypertension: Secondary | ICD-10-CM | POA: Diagnosis present

## 2014-12-14 DIAGNOSIS — R945 Abnormal results of liver function studies: Secondary | ICD-10-CM | POA: Diagnosis not present

## 2014-12-14 DIAGNOSIS — K219 Gastro-esophageal reflux disease without esophagitis: Secondary | ICD-10-CM | POA: Diagnosis present

## 2014-12-14 DIAGNOSIS — R188 Other ascites: Secondary | ICD-10-CM | POA: Diagnosis present

## 2014-12-14 DIAGNOSIS — Z86010 Personal history of colonic polyps: Secondary | ICD-10-CM

## 2014-12-14 DIAGNOSIS — D649 Anemia, unspecified: Secondary | ICD-10-CM | POA: Diagnosis not present

## 2014-12-14 DIAGNOSIS — A419 Sepsis, unspecified organism: Secondary | ICD-10-CM

## 2014-12-14 DIAGNOSIS — C801 Malignant (primary) neoplasm, unspecified: Secondary | ICD-10-CM | POA: Diagnosis not present

## 2014-12-14 DIAGNOSIS — D638 Anemia in other chronic diseases classified elsewhere: Secondary | ICD-10-CM | POA: Diagnosis present

## 2014-12-14 DIAGNOSIS — R4182 Altered mental status, unspecified: Secondary | ICD-10-CM | POA: Diagnosis present

## 2014-12-14 DIAGNOSIS — M1990 Unspecified osteoarthritis, unspecified site: Secondary | ICD-10-CM | POA: Diagnosis present

## 2014-12-14 DIAGNOSIS — C259 Malignant neoplasm of pancreas, unspecified: Secondary | ICD-10-CM | POA: Diagnosis present

## 2014-12-14 DIAGNOSIS — C787 Secondary malignant neoplasm of liver and intrahepatic bile duct: Principal | ICD-10-CM | POA: Diagnosis present

## 2014-12-14 HISTORY — DX: Personal history of other diseases of the musculoskeletal system and connective tissue: Z87.39

## 2014-12-14 LAB — CBC
HEMATOCRIT: 54.4 % — AB (ref 39.0–52.0)
HEMOGLOBIN: 17.9 g/dL — AB (ref 13.0–17.0)
MCH: 34.9 pg — AB (ref 26.0–34.0)
MCHC: 32.9 g/dL (ref 30.0–36.0)
MCV: 106 fL — ABNORMAL HIGH (ref 78.0–100.0)
Platelets: 88 10*3/uL — ABNORMAL LOW (ref 150–400)
RBC: 5.13 MIL/uL (ref 4.22–5.81)
RDW: 20.2 % — AB (ref 11.5–15.5)
WBC: 2.5 10*3/uL — ABNORMAL LOW (ref 4.0–10.5)

## 2014-12-14 LAB — I-STAT TROPONIN, ED: Troponin i, poc: 0.01 ng/mL (ref 0.00–0.08)

## 2014-12-14 LAB — COMPREHENSIVE METABOLIC PANEL
ALT: 79 U/L — ABNORMAL HIGH (ref 17–63)
AST: 357 U/L — AB (ref 15–41)
Albumin: 2.9 g/dL — ABNORMAL LOW (ref 3.5–5.0)
Alkaline Phosphatase: 673 U/L — ABNORMAL HIGH (ref 38–126)
Anion gap: 7 (ref 5–15)
BUN: 17 mg/dL (ref 6–20)
CHLORIDE: 100 mmol/L — AB (ref 101–111)
CO2: 31 mmol/L (ref 22–32)
Calcium: 8.6 mg/dL — ABNORMAL LOW (ref 8.9–10.3)
Creatinine, Ser: 0.72 mg/dL (ref 0.61–1.24)
GFR calc Af Amer: >60 mL/min (ref >60)
GFR calc non Af Amer: >60 mL/min (ref >60)
Glucose, Bld: 148 mg/dL — ABNORMAL HIGH (ref 65–99)
Potassium: 4.4 mmol/L (ref 3.5–5.1)
Sodium: 138 mmol/L (ref 135–145)
TOTAL PROTEIN: 6.3 g/dL — AB (ref 6.5–8.1)
Total Bilirubin: 2.5 mg/dL — ABNORMAL HIGH (ref 0.3–1.2)

## 2014-12-14 LAB — URINALYSIS, ROUTINE W REFLEX MICROSCOPIC
BILIRUBIN URINE: NEGATIVE
Glucose, UA: NEGATIVE mg/dL
Hgb urine dipstick: NEGATIVE
Ketones, ur: NEGATIVE mg/dL
Leukocytes, UA: NEGATIVE
Nitrite: NEGATIVE
PROTEIN: NEGATIVE mg/dL
Specific Gravity, Urine: 1.02 (ref 1.005–1.030)
Urobilinogen, UA: 1 mg/dL (ref 0.0–1.0)
pH: 6 (ref 5.0–8.0)

## 2014-12-14 LAB — AMMONIA: Ammonia: 16 umol/L (ref 9–35)

## 2014-12-14 LAB — MRSA PCR SCREENING: MRSA by PCR: NEGATIVE

## 2014-12-14 LAB — BRAIN NATRIURETIC PEPTIDE: B NATRIURETIC PEPTIDE 5: 483.1 pg/mL — AB (ref 0.0–100.0)

## 2014-12-14 LAB — CBG MONITORING, ED
Glucose-Capillary: 138 mg/dL — ABNORMAL HIGH (ref 65–99)
Glucose-Capillary: 143 mg/dL — ABNORMAL HIGH (ref 65–99)

## 2014-12-14 LAB — I-STAT CG4 LACTIC ACID, ED: Lactic Acid, Venous: 1.57 mmol/L (ref 0.5–2.0)

## 2014-12-14 MED ORDER — IOHEXOL 300 MG/ML  SOLN
100.0000 mL | Freq: Once | INTRAMUSCULAR | Status: AC | PRN
  Administered 2014-12-14: 100 mL via INTRAVENOUS

## 2014-12-14 MED ORDER — MORPHINE SULFATE 2 MG/ML IJ SOLN
1.0000 mg | INTRAMUSCULAR | Status: DC | PRN
  Administered 2014-12-15 – 2014-12-18 (×5): 1 mg via INTRAVENOUS
  Filled 2014-12-14 (×5): qty 1

## 2014-12-14 MED ORDER — VANCOMYCIN HCL IN DEXTROSE 1-5 GM/200ML-% IV SOLN
1000.0000 mg | INTRAVENOUS | Status: AC
  Administered 2014-12-14: 1000 mg via INTRAVENOUS
  Filled 2014-12-14: qty 200

## 2014-12-14 MED ORDER — SODIUM CHLORIDE 0.9 % IV BOLUS (SEPSIS)
1000.0000 mL | Freq: Once | INTRAVENOUS | Status: AC
  Administered 2014-12-14: 1000 mL via INTRAVENOUS

## 2014-12-14 MED ORDER — VANCOMYCIN HCL IN DEXTROSE 750-5 MG/150ML-% IV SOLN
750.0000 mg | Freq: Two times a day (BID) | INTRAVENOUS | Status: DC
  Administered 2014-12-15 – 2014-12-18 (×7): 750 mg via INTRAVENOUS
  Filled 2014-12-14 (×8): qty 150

## 2014-12-14 MED ORDER — SODIUM CHLORIDE 0.9 % IV BOLUS (SEPSIS)
500.0000 mL | Freq: Once | INTRAVENOUS | Status: AC
  Administered 2014-12-14: 500 mL via INTRAVENOUS

## 2014-12-14 MED ORDER — ACETAMINOPHEN 650 MG RE SUPP
650.0000 mg | Freq: Once | RECTAL | Status: AC
  Administered 2014-12-14: 650 mg via RECTAL
  Filled 2014-12-14: qty 1

## 2014-12-14 MED ORDER — ONDANSETRON HCL 4 MG/2ML IJ SOLN: 4.0000 mg | Freq: Four times a day (QID) | INTRAMUSCULAR | Status: DC | PRN

## 2014-12-14 MED ORDER — INSULIN ASPART 100 UNIT/ML ~~LOC~~ SOLN
0.0000 [IU] | Freq: Three times a day (TID) | SUBCUTANEOUS | Status: DC
  Administered 2014-12-15: 3 [IU] via SUBCUTANEOUS
  Administered 2014-12-15 (×2): 2 [IU] via SUBCUTANEOUS
  Administered 2014-12-16 – 2014-12-17 (×4): 3 [IU] via SUBCUTANEOUS
  Administered 2014-12-17: 1 [IU] via SUBCUTANEOUS
  Administered 2014-12-17 – 2014-12-18 (×2): 2 [IU] via SUBCUTANEOUS
  Administered 2014-12-18: 1 [IU] via SUBCUTANEOUS

## 2014-12-14 MED ORDER — HYDROCORTISONE NA SUCCINATE PF 100 MG IJ SOLR
100.0000 mg | Freq: Three times a day (TID) | INTRAMUSCULAR | Status: DC
  Administered 2014-12-14 – 2014-12-15 (×3): 100 mg via INTRAVENOUS
  Filled 2014-12-14 (×3): qty 2

## 2014-12-14 MED ORDER — PIPERACILLIN-TAZOBACTAM 3.375 G IVPB
3.3750 g | Freq: Three times a day (TID) | INTRAVENOUS | Status: DC
  Administered 2014-12-14 – 2014-12-18 (×11): 3.375 g via INTRAVENOUS
  Filled 2014-12-14 (×12): qty 50

## 2014-12-14 MED ORDER — SODIUM CHLORIDE 0.9 % IJ SOLN
3.0000 mL | Freq: Two times a day (BID) | INTRAMUSCULAR | Status: DC
  Administered 2014-12-14 – 2014-12-18 (×6): 3 mL via INTRAVENOUS

## 2014-12-14 MED ORDER — ONDANSETRON HCL 4 MG PO TABS: 4.0000 mg | ORAL_TABLET | Freq: Four times a day (QID) | ORAL | Status: DC | PRN

## 2014-12-14 MED ORDER — ENOXAPARIN SODIUM 80 MG/0.8ML ~~LOC~~ SOLN
75.0000 mg | Freq: Two times a day (BID) | SUBCUTANEOUS | Status: DC
  Administered 2014-12-14 – 2014-12-15 (×2): 75 mg via SUBCUTANEOUS
  Filled 2014-12-14 (×4): qty 0.8

## 2014-12-14 MED ORDER — SODIUM CHLORIDE 0.9 % IV SOLN
INTRAVENOUS | Status: DC
  Administered 2014-12-14 – 2014-12-17 (×4): via INTRAVENOUS

## 2014-12-14 MED ORDER — PIPERACILLIN-TAZOBACTAM 3.375 G IVPB 30 MIN
3.3750 g | Freq: Once | INTRAVENOUS | Status: AC
  Administered 2014-12-14: 3.375 g via INTRAVENOUS
  Filled 2014-12-14: qty 50

## 2014-12-14 NOTE — Progress Notes (Signed)
Utilization Review completed.  Tyshana Nishida RN CM  

## 2014-12-14 NOTE — ED Provider Notes (Signed)
CSN: [id number]     Arrival date & time 12/14/14  1329 History   First MD Initiated Contact with Patient 12/14/14 1458     Chief Complaint  Patient presents with  . Altered Mental Status  . Shortness of Breath     (Consider location/radiation/quality/duration/timing/severity/associated sxs/prior Treatment) Patient is a 81 y.o. male presenting with altered mental status. The history is provided by the patient, the EMS personnel and the nursing home.  Altered Mental Status Presenting symptoms: confusion   Severity:  Moderate Most recent episode:  Today Episode history:  Single Timing:  Constant Progression:  Unchanged Chronicity:  New Context: nursing home resident   Context: not a recent change in medication and not a recent infection   Associated symptoms: abdominal pain     Past Medical History  Diagnosis Date  . Diverticulosis of colon (without mention of hemorrhage)   . Irritable bowel syndrome   . Personal history of malignant neoplasm of prostate     s/p  radioactive seed implants 2000  . Unspecified hereditary and idiopathic peripheral neuropathy   . Nonischemic cardiomyopathy cardiologist-  dr Aundra Dubin    EF 25%  per last echo 2012 and cardiologist note--  2007-  ef  40-45%;  2008- ef 35-40%;  2010- ef 30-35%;  2012- ef 25%  . Sinus bradycardia   . Hypertension   . Arthritis     hands  . Hyperlipidemia   . History of DVT of lower extremity     2002  ,   left leg  . History of adenomatous polyp of colon     2001  . History of basal cell carcinoma excision   . Anticoagulated on Coumadin   . Type 2 diabetes, diet controlled   . GERD (gastroesophageal reflux disease)   . LBBB (left bundle branch block)   . Wears glasses   . Wears hearing aid     BILATERAL  . Unsteady gait   . DDD (degenerative disc disease)     cervical and lumbar  . DJD (degenerative joint disease)     hips  . Lower urinary tract symptoms (LUTS)   . 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 Coronary artery disease     per cath 2002  non-obstructive cad  . Chronic systolic heart failure   . H/O fracture of nose     12-30-2013-  pt fell at home open nose bone fx  . Abdominal pain 12/20/2013  . Abnormal MRI of abdomen 01/31/2014  . Acute encephalopathy 07/02/2014  . Acute thromboembolism of deep veins of lower extremity 05/03/2007    Qualifier: History of  By: Doy Mince LPN, Megan  28/36/[birth year] Doppler LLE: DVT at left femoral and popliteal veins not completely occlusive. Will anticoagulate with Eliquis 2.66m bid.    . Anemia of chronic disease 02/20/2013  . CARDIOMYOPATHY 07/06/2007    Qualifier: Diagnosis of  By: NLenna GilfordMD, SDeborra Medina  . Constipation 04/04/2014  . Coronary atherosclerosis 07/06/2007    Qualifier: Diagnosis of  By: NLenna GilfordMD, [place] Gardens[place]EASE 05/03/2007    Qualifier: Diagnosis of  By: RDoy MinceLPN, Megan    . DVT (deep venous thrombosis)   . Edema 05/28/2014  . Failure to thrive in adult 03/30/2014  . Fall in elderly patient 12/30/2013  . Fever 07/20/2014  . Frailty 03/30/2014  . FUO (fever of unknown  origin) 05/28/2014    05/28/14 CXR right lower lobe bronchopneumonia.    Marland Kitchen GERD 05/03/2007    Qualifier: Diagnosis of  By: Doy Mince LPN, Megan    . Hereditary and idiopathic peripheral neuropathy 07/06/2007    Qualifier: Diagnosis of  By: Lenna Gilford MD, Deborra Medina   . HYPERLIPIDEMIA 05/03/2007    Qualifier: Diagnosis of  By: Doy Mince LPN, Megan    . HYPERTENSION, BENIGN 06/23/2010    Qualifier: Diagnosis of  By: Olevia Perches, MD, Glenetta Hew   . Insomnia 04/04/2014  . Liver metastasis 04/01/2014  . Malnutrition of moderate degree 03/30/2014    05/01/14 albumin 2.8 05/29/14 albumin 3.0   . Metastatic cancer 03/29/2014  . Normocytic anemia 03/30/2014    04/10/14 Hgb 9.3   . Pancreatic cancer 04/06/2014    06/06/14 Oncology: stage IV pancreatic cancer with Mets to liver: CA 19-9  400--194. CT showed more liver metastases. Xeloda 1540m bid x 14days on and 7 days off. 3 cycles of tx and then repeat the scans.    . Pressure ulcer stage II 08/15/2014    08/14/14 L scrotum small PU-not infected-tx initiated.    . Protein-calorie malnutrition, severe 07/05/2014  . SPINAL STENOSIS, LUMBAR 07/06/2007    Qualifier: Diagnosis of  By: NLenna GilfordMD, SDeborra Medina  . Streptococcal bacteremia 07/23/2014  . Transaminitis 03/30/2014    04/10/14 Alkaline phosphatase 126(39-117) 05/01/14 AST 33, ALT 45, alk  Phos 75 05/29/14 AST 64, ALT 70 alk phos 209   . Type 2 diabetes mellitus 05/03/2007    Qualifier: Diagnosis of  By: RDoy MinceLPN, Megan  141/28/78Hgb A1c 7.1    . Weakness generalized 04/02/2014   Past Surgical History  Procedure Laterality Date  . Knee arthroscopy Left 1991  . Ercp with sphincterotomy and stone removal  05/ 1993  &  05-11-2006  . Total knee arthroplasty  10/05/2011    Procedure: TOTAL KNEE ARTHROPLASTY;  Surgeon: FGearlean Alf MD;  Location: [facility];  Service: Orthopedics;  Laterality: Right;  . Transurethral resection of bladder tumor N/A 09/30/2012    Procedure: TRANSURETHRAL RESECTION OF BLADDER TUMOR (TURBT);  Surgeon: SFranchot Gallo MD;  Location: [facility];  Service: Urology;  Laterality: N/A;  WITH MITOMYCIN INSTILLATION   . Eus N/A 01/04/2014    Procedure: ESOPHAGEAL ENDOSCOPIC ULTRASOUND (EUS) RADIAL;  Surgeon: DMilus Banister MD;  Location: [facility];  Service: Endoscopy;  Laterality: N/A;  . Radioactive prostate seed implants  01/ 2000  . Mohs surgery  03/ 2009    LEFT EAR BASAL CELL CARCINOMA  . Tonsillectomy  AS CHILD  . Cholecystectomy open  1985  . Appendectomy  child  . Inguinal hernia repair Bilateral 1998    recurrent RIH  w/ repair 06-21-2006  . Total knee arthroplasty Left 02-21-2007    AND 04-21-2007  CLOSED MANIPULATION  . Cataract extraction w/ intraocular lens implant Right 2004  . Lumbar spine surgery  X2  . Cardiac catheterization   06-14-2001  dr bDarnell Levelbrodie    non-obstructive CAD/  LM 30%,  pLAD 50%,  mLAD 30%,  RCA  &  CFX  irregularites ,  global hypokinesis,  ef 40%  . Cardiovascular stress test  02-07-2007   dr bDarnell Levelbrodie    negative for ischemia,  low risk myoview perfusion study  . Transthoracic echocardiogram  01-28-2011    moderate LVH/  ef 25%/  diffuse hypokinesis/  mild LAE/  mild TR/  trivial AR & MR  . Cystoscopy w/  retrogrades Bilateral 03/09/2014    Procedure: CYSTOSCOPY WITH BILATERAL RETROGRADE PYELOGRAM BLADDER BX AND RENAL WASHINGS.;  Surgeon: Jorja Loa, MD;  Location: [facility];  Service: Urology;  Laterality: Bilateral;  . Eus N/A 04/05/2014    Procedure: UPPER ENDOSCOPIC ULTRASOUND (EUS) LINEAR;  Surgeon: [name], MD;  Location: [facility];  Service: Endoscopy;  Laterality: N/A;  radial linear  . Endoscopic retrograde cholangiopancreatography (ercp) with propofol N/A 04/12/2014    Procedure: ENDOSCOPIC RETROGRADE CHOLANGIOPANCREATOGRAPHY (ERCP) WITH PROPOFOL;  Surgeon: [name], MD;  Location: [facility];  Service: Endoscopy;  Laterality: N/A;  metal stent  . Biliary stent placement N/A 04/12/2014    Procedure: BILIARY STENT PLACEMENT;  Surgeon: [name], MD;  Location: [facility];  Service: Endoscopy;  Laterality: N/A;   Family History  Problem Relation Age of Onset  . Throat cancer Brother   . Coronary artery disease Father     deceased   History  Substance Use Topics  . Smoking status: Former Smoker -- 1.00 packs/day for 10 years    Types: Cigarettes    Start date: 04/10/1954    Quit date: 09/10/1982  . Smokeless tobacco: Never Used     Comment: quit 32 years ago  . Alcohol Use: No    Review of Systems  Unable to perform ROS: Mental status change  Gastrointestinal: Positive for abdominal pain.  Psychiatric/Behavioral: Positive for confusion.      Allergies  Review of patient's allergies indicates no known allergies.  Home  Medications   Prior to Admission medications   Medication Sig Start Date End Date Taking? Authorizing Provider  acetaminophen (TYLENOL) 325 MG tablet Take by mouth every 6 (six) hours as needed for fever (For fever greater than 101. Give one dose).   Yes Historical Provider, MD  apixaban (ELIQUIS) 5 MG TABS tablet Take 1 tablet (5 mg total) by mouth 2 (two) times daily. 07/23/14  Yes Bonnielee Haff, MD  bisacodyl (DULCOLAX) 10 MG suppository Place 1 suppository (10 mg total) rectally daily as needed for moderate constipation. 04/03/14  Yes Christina P Rama, MD  capecitabine (XELODA) 500 MG tablet Take 3 tablets (1,500 mg total) by mouth 2 (two) times daily after a meal. TAKES 3 TABS (1500) MG BID  ///  ON 14 DAYS OFF 7 DAYS FOR 3 CYCLES.   STARTED 06/06/14 Patient taking differently: Take 1,500 mg by mouth 2 (two) times daily after a meal. Take 3 tablets (1539m) by mouth twice daily. Give for 14 days, Off for seven days 07/06/14  Yes DEugenie Filler MD  carvedilol (COREG) 12.5 MG tablet Take 12.5 mg by mouth 2 (two) times daily with a meal.   Yes Historical Provider, MD  docusate sodium 100 MG CAPS Take 100 mg by mouth 2 (two) times daily. 04/03/14  Yes Christina P Rama, MD  feeding supplement, GLUCERNA SHAKE, (GLUCERNA SHAKE) LIQD Take 237 mLs by mouth 2 (two) times daily between meals as needed (If pt with <50% meal completion). 07/06/14  Yes DEugenie Filler MD  HYDROcodone-acetaminophen ([facility] 10-325 MG per tablet Take one tablet by mouth every 4 hours as needed for pain 12/03/14  Yes AHennie Duos MD  insulin glargine (LANTUS) 100 UNIT/ML injection Inject 3 Units into the skin at bedtime.   Yes Historical Provider, MD  lactulose (CHRONULAC) 10 GM/15ML solution Take 20 g by mouth daily as needed for mild constipation (368mas needed for constipation).    Yes Historical Provider, MD  lidocaine-prilocaine (EMLA) cream Apply 1 application topically 6 (six) times daily. Apply topically every 4  hours as needed for pain   Yes Historical Provider, MD  LORazepam (ATIVAN) 0.5 MG tablet Take 2 tablets (1 mg total) by mouth every 6 (six) hours as needed (nausea/vomiting). Patient taking differently: Take 0.5-1 mg by mouth See admin instructions.  07/06/14  Yes Eugenie Filler, MD  mirtazapine (REMERON) 7.5 MG tablet Take 7.5 mg by mouth at bedtime.   Yes Historical Provider, MD  morphine (ROXANOL) 20 MG/ML concentrated solution Take 5 mg by mouth every 2 (two) hours as needed for severe pain (Give 0.72m=5mg).   Yes Historical Provider, MD  Multiple Vitamin (MULTIVITAMIN WITH MINERALS) TABS tablet Take 1 tablet by mouth daily. 07/23/14  Yes GBonnielee Haff MD  Multiple Vitamins-Minerals (PRESERVISION AREDS 2) CAPS Take 1 capsule by mouth 2 (two) times daily.   Yes Historical Provider, MD  nitroGLYCERIN (NITROSTAT) 0.4 MG SL tablet Place 1 tablet (0.4 mg total) under the tongue every 5 (five) minutes as needed for chest pain. 11/22/13  Yes JThompson Grayer MD  Omega-3 Fatty Acids (FISH OIL) 1000 MG CAPS Take 1 capsule by mouth.   Yes Historical Provider, MD  ondansetron (ZOFRAN) 8 MG tablet Take by mouth 2 (two) times daily as needed for nausea or vomiting.    Yes Historical Provider, MD  oxyCODONE (OXY IR/ROXICODONE) 5 MG immediate release tablet Take one tablet by mouth twice daily for pain 09/11/14  Yes Mahima Pandey, MD  polyethylene glycol (MIRALAX / GLYCOLAX) packet Take 17 g by mouth 2 (two) times daily.   Yes Historical Provider, MD  polyvinyl alcohol (LIQUIFILM TEARS) 1.4 % ophthalmic solution Place 1 drop into both eyes 4 (four) times daily as needed for dry eyes.   Yes Historical Provider, MD  pravastatin (PRAVACHOL) 40 MG tablet Take 40 mg by mouth every evening.    Yes Historical Provider, MD  pregabalin (LYRICA) 75 MG capsule Take 75 mg by mouth daily.   Yes Historical Provider, MD  PRESCRIPTION MEDICATION    Yes Historical Provider, MD  prochlorperazine (COMPAZINE) 5 MG tablet Take 5 mg  by mouth every 6 (six) hours as needed for nausea or vomiting.   Yes Historical Provider, MD  ranitidine (ZANTAC) 150 MG tablet Take 150 mg by mouth 2 (two) times daily.   Yes Historical Provider, MD  temazepam (RESTORIL) 15 MG capsule Take 15 mg by mouth at bedtime.    Yes Historical Provider, MD   BP 108/43 mmHg  Pulse 78  Temp(Src) 103.7 F (39.8 C) (Rectal)  Resp 15  SpO2 95% Physical Exam  Constitutional: He appears well-developed and well-nourished. He appears listless. No distress.  HENT:  Head: Normocephalic and atraumatic.  Mouth/Throat: No oropharyngeal exudate.  Eyes: EOM are normal. Pupils are equal, round, and reactive to light.  Neck: Normal range of motion. Neck supple.  Cardiovascular: Normal rate and regular rhythm.  Exam reveals no friction rub.   No murmur heard. Pulmonary/Chest: Effort normal and breath sounds normal. No respiratory distress. He has no wheezes. He has no rales.  Abdominal: Soft. He exhibits no distension. There is tenderness (central). There is no rebound.  Musculoskeletal: Normal range of motion. He exhibits no edema.  Neurological: He appears listless. GCS eye subscore is 3. GCS verbal subscore is 5. GCS motor subscore is 6.  Confused  Skin: No rash noted. He is not diaphoretic.  Nursing note and vitals reviewed.   ED Course  Procedures (including  critical care time) Labs Review Labs Reviewed  COMPREHENSIVE METABOLIC PANEL - Abnormal; Notable for the following:    Chloride 100 (*)    Glucose, Bld 148 (*)    Calcium 8.6 (*)    Total Protein 6.3 (*)    Albumin 2.9 (*)    AST 357 (*)    ALT 79 (*)    Alkaline Phosphatase 673 (*)    Total Bilirubin 2.5 (*)    All other components within normal limits  CBC - Abnormal; Notable for the following:    WBC 2.5 (*)    Hemoglobin 17.9 (*)    HCT 54.4 (*)    MCV 106.0 (*)    MCH 34.9 (*)    RDW 20.2 (*)    All other components within normal limits  CBG MONITORING, ED - Abnormal; Notable for  the following:    Glucose-Capillary 138 (*)    All other components within normal limits  CULTURE, BLOOD (ROUTINE X 2)  CULTURE, BLOOD (ROUTINE X 2)  URINE CULTURE  URINALYSIS, ROUTINE W REFLEX MICROSCOPIC (NOT AT [facility])  BRAIN NATRIURETIC PEPTIDE  I-STAT CG4 LACTIC ACID, ED  Randolm Idol, ED    Imaging Review Dg Chest Port 1 View  12/14/2014   CLINICAL DATA:  Shortness of breath.  EXAM: PORTABLE CHEST - 1 VIEW  COMPARISON:  July 19, 2014.  FINDINGS: Stable cardiomediastinal silhouette. No pneumothorax or pleural effusion is noted. Stable right internal jugular Port-A-Cath is noted with distal tip overlying expected position of SVC. Increased central pulmonary vascular congestion is noted as well as bilateral interstitial densities concerning for pulmonary edema. Bony thorax is intact.  IMPRESSION: Increased central pulmonary vascular congestion is noted with probable bilateral pulmonary edema.   Electronically Signed   By: Marijo Conception, M.D.   On: 12/14/2014 14:38     EKG Interpretation   Date/Time:  Friday Dec 14 2014 13:42:39 EDT Ventricular Rate:  83 PR Interval:  237 QRS Duration: 126 QT Interval:  413 QTC Calculation: 485 R Axis:   -84 Text Interpretation:  Sinus rhythm Prolonged PR interval IVCD, consider  atypical LBBB No significant change since last tracing Confirmed by Mingo Amber   MD, [place] ([id number]) on 12/14/2014 4:50:15 PM      MDM   Final diagnoses:  Confusion  Fever    75M with hx of metastatic pancreatic cancer on chemo and Eliquis presents with confusion and fever. Normally oriented, no hx of dementia. Patient confused today, he is running a fever. He is a DNR. Febrile rectally here, mild hypoxia. Doesn't wear baseline O2. He is confused, but does follow commands. He reports central abdominal pain, but his confusion may limit his abdominal exam. Labs show normal lactate, leukopenia. Will continue with cultures and urine studies.  Patient is not jaundice,  so does not fit Reynold's Pentad, however with fever, confusion, softer BPs, and complaints of abdominal pain, concern for cholangitis, will start Zosyn. CT of his abdomen ordered. CT similar to prior. No concerning findings. Admitted for confusion, fever.   Evelina Bucy, MD 12/14/14 [phone]

## 2014-12-14 NOTE — ED Notes (Signed)
MD at bedside. 

## 2014-12-14 NOTE — Progress Notes (Signed)
ANTIBIOTIC CONSULT NOTE - INITIAL  Pharmacy Consult for vancomycin Indication: rule out sepsis  No Known Allergies  Patient Measurements: weight 77 kg, height 69 inches   Vital Signs: Temp: 103.7 F (39.8 C) (05/27 1345) Temp Source: Rectal (05/27 1345) BP: 108/43 mmHg (05/27 1430) Pulse Rate: 78 (05/27 1400) Intake/Output from previous day:   Intake/Output from this shift:    Labs:  Recent Labs  12/14/14 1429  WBC 2.5*  HGB 17.9*  PLT 88*  CREATININE 0.72   CrCl cannot be calculated (Unknown ideal weight.). No results for input(s): VANCOTROUGH, VANCOPEAK, VANCORANDOM, GENTTROUGH, GENTPEAK, GENTRANDOM, TOBRATROUGH, TOBRAPEAK, TOBRARND, AMIKACINPEAK, AMIKACINTROU, AMIKACIN in the last 72 hours.   Microbiology: No results found for this or any previous visit (from the past 720 hour(s)).  Medical History: Past Medical History  Diagnosis Date  . Diverticulosis of colon (without mention of hemorrhage)   . Irritable bowel syndrome   . Personal history of malignant neoplasm of prostate     s/p  radioactive seed implants 2000  . Unspecified hereditary and idiopathic peripheral neuropathy   . Nonischemic cardiomyopathy cardiologist-  dr Aundra Dubin    EF 25%  per last echo 2012 and cardiologist note--  2007-  ef  40-45%;  2008- ef 35-40%;  2010- ef 30-35%;  2012- ef 25%  . Sinus bradycardia   . Hypertension   . Arthritis     hands  . Hyperlipidemia   . History of DVT of lower extremity     2002  ,   left leg  . History of adenomatous polyp of colon     2001  . History of basal cell carcinoma excision   . Anticoagulated on Coumadin   . Type 2 diabetes, diet controlled   . GERD (gastroesophageal reflux disease)   . LBBB (left bundle branch block)   . Wears glasses   . Wears hearing aid     BILATERAL  . Unsteady gait   . DDD (degenerative disc disease)     cervical and lumbar  . DJD (degenerative joint disease)     hips  . Lower urinary tract symptoms (LUTS)   .  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 Coronary artery disease     per cath 2002  non-obstructive cad  . Chronic systolic heart failure   . H/O fracture of nose     12-30-2013-  pt fell at home open nose bone fx  . Abdominal pain 12/20/2013  . Abnormal MRI of abdomen 01/31/2014  . Acute encephalopathy 07/02/2014  . Acute thromboembolism of deep veins of lower extremity 05/03/2007    Qualifier: History of  By: Doy Mince LPN, Megan  97[birth year] Doppler LLE: DVT at left femoral and popliteal veins not completely occlusive. Will anticoagulate with Eliquis 2.78m bid.    . Anemia of chronic disease 02/20/2013  . CARDIOMYOPATHY 07/06/2007    Qualifier: Diagnosis of  By: NLenna GilfordMD, SDeborra Medina  . Constipation 04/04/2014  . Coronary atherosclerosis 07/06/2007    Qualifier: Diagnosis of  By: NLenna GilfordMD, [place] 05/03/2007    Qualifier: Diagnosis of  By: RDoy MinceLPN, Megan    . DVT (deep venous thrombosis)   . Edema 05/28/2014  . Failure to thrive in adult 03/30/2014  . Fall in elderly patient 12/30/2013  . Fever 07/20/2014  . Frailty 03/30/2014  . FUO (fever of unknown  origin) 05/28/2014    05/28/14 CXR right lower lobe bronchopneumonia.    Marland Kitchen GERD 05/03/2007    Qualifier: Diagnosis of  By: Doy Mince LPN, Megan    . Hereditary and idiopathic peripheral neuropathy 07/06/2007    Qualifier: Diagnosis of  By: Lenna Gilford MD, Deborra Medina   . HYPERLIPIDEMIA 05/03/2007    Qualifier: Diagnosis of  By: Doy Mince LPN, Megan    . HYPERTENSION, BENIGN 06/23/2010    Qualifier: Diagnosis of  By: Olevia Perches, MD, Glenetta Hew   . Insomnia 04/04/2014  . Liver metastasis 04/01/2014  . Malnutrition of moderate degree 03/30/2014    05/01/14 albumin 2.8 05/29/14 albumin 3.0   . Metastatic cancer 03/29/2014  . Normocytic anemia 03/30/2014    04/10/14 Hgb 9.3   . Pancreatic cancer 04/06/2014    06/06/14 Oncology: stage IV  pancreatic cancer with Mets to liver: CA 19-9 400--194. CT showed more liver metastases. Xeloda 1587m bid x 14days on and 7 days off. 3 cycles of tx and then repeat the scans.    . Pressure ulcer stage II 08/15/2014    08/14/14 L scrotum small PU-not infected-tx initiated.    . Protein-calorie malnutrition, severe 07/05/2014  . SPINAL STENOSIS, LUMBAR 07/06/2007    Qualifier: Diagnosis of  By: NLenna GilfordMD, SDeborra Medina  . Streptococcal bacteremia 07/23/2014  . Transaminitis 03/30/2014    04/10/14 Alkaline phosphatase 126(39-117) 05/01/14 AST 33, ALT 45, alk  Phos 75 05/29/14 AST 64, ALT 70 alk phos 209   . Type 2 diabetes mellitus 05/03/2007    Qualifier: Diagnosis of  By: RDoy MinceLPN, Megan  194/80/16Hgb A1c 7.1    . Weakness generalized 04/02/2014    Medications:  See med rec  Assessment: Patient's an 81y.o M presented to the ED from NH with c/o AMS, generalized pain, and wheezing. He was found to be febrile.  CXR showed increased pulmonary congestion and possible bilateral pulmonary edema.  To start vancomycin for r/o sepsis.   Goal of Therapy:  Vancomycin trough level 15-20 mcg/ml  Plan:  - vancomycin 100110mx1 now, then 75044mV q12h - monitor renal function and clinical status  Philipe Laswell P 12/14/2014,3:13 PM

## 2014-12-14 NOTE — ED Notes (Signed)
Pt from [place] home. Pt confused upon arrival, yells out any time anybody touches him. Facility told EMS pt was oriented, but that is not the case. Pt disoriented 4x. No hx of dementia in facility paperwork or EPIC. Facility told EMS pt has been complaining of chest, abdomen, L arm and L leg pain. Pt's L wrist swollen. No hx of recent falls reported to EMS. EMS also noted diminished lung sounds throughout, gave breathing tx prior to arrival which caused wheezing. Facility reported temp of 100.66F, Rectal temp 103F on arrival. Pt has hx of liver and pancreatic cancer.

## 2014-12-14 NOTE — Progress Notes (Signed)
ANTICOAGULATION CONSULT NOTE - Initial Consult  Pharmacy Consult for lovenox Indication: hx DVT, bridging while off Eliquis  No Known Allergies  Patient Measurements: weight 77 kg, height 69 inches    Vital Signs: Temp: 103.7 F (39.8 C) (05/27 1345) Temp Source: Rectal (05/27 1345) BP: 98/38 mmHg (05/27 1815) Pulse Rate: 65 (05/27 1815)  Labs:  Recent Labs  12/14/14 1429  HGB 17.9*  HCT 54.4*  PLT 88*  CREATININE 0.72    CrCl cannot be calculated (Unknown ideal weight.).   Medical History: Past Medical History  Diagnosis Date  . Diverticulosis of colon (without mention of hemorrhage)   . Irritable bowel syndrome   . Personal history of malignant neoplasm of prostate     s/p  radioactive seed implants 2000  . Unspecified hereditary and idiopathic peripheral neuropathy   . Nonischemic cardiomyopathy cardiologist-  dr Aundra Dubin    EF 25%  per last echo 2012 and cardiologist note--  2007-  ef  40-45%;  2008- ef 35-40%;  2010- ef 30-35%;  2012- ef 25%  . Sinus bradycardia   . Hypertension   . Arthritis     hands  . Hyperlipidemia   . History of DVT of lower extremity     2002  ,   left leg  . History of adenomatous polyp of colon     2001  . History of basal cell carcinoma excision   . Anticoagulated on Coumadin   . Type 2 diabetes, diet controlled   . GERD (gastroesophageal reflux disease)   . LBBB (left bundle branch block)   . Wears glasses   . Wears hearing aid     BILATERAL  . Unsteady gait   . DDD (degenerative disc disease)     cervical and lumbar  . DJD (degenerative joint disease)     hips  . Lower urinary tract symptoms (LUTS)   . 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 Coronary artery disease     per cath 2002  non-obstructive cad  . Chronic systolic heart failure   . H/O fracture of nose     12-30-2013-  pt fell at home open nose bone fx  . Abdominal  pain 12/20/2013  . Abnormal MRI of abdomen 01/31/2014  . Acute encephalopathy 07/02/2014  . Acute thromboembolism of deep veins of lower extremity 05/03/2007    Qualifier: History of  By: Doy Mince LPN, Megan  34/28/76 Doppler LLE: DVT at left femoral and popliteal veins not completely occlusive. Will anticoagulate with Eliquis 2.25m bid.    . Anemia of chronic disease 02/20/2013  . CARDIOMYOPATHY 07/06/2007    Qualifier: Diagnosis of  By: NLenna GilfordMD, SDeborra Medina  . Constipation 04/04/2014  . Coronary atherosclerosis 07/06/2007    Qualifier: Diagnosis of  By: NLenna GilfordMD, [place] 05/03/2007    Qualifier: Diagnosis of  By: RDoy MinceLPN, Megan    . DVT (deep venous thrombosis)   . Edema 05/28/2014  . Failure to thrive in adult 03/30/2014  . Fall in elderly patient 12/30/2013  . Fever 07/20/2014  . Frailty 03/30/2014  . FUO (fever of unknown origin) 05/28/2014    05/28/14 CXR right lower lobe bronchopneumonia.    .Marland KitchenGERD 05/03/2007    Qualifier: Diagnosis of  By: RDoy MinceLPN, Megan    . Hereditary and idiopathic peripheral neuropathy 07/06/2007    Qualifier: Diagnosis  of  By: Lenna Gilford MD, Deborra Medina   . HYPERLIPIDEMIA 05/03/2007    Qualifier: Diagnosis of  By: Doy Mince LPN, Megan    . HYPERTENSION, BENIGN 06/23/2010    Qualifier: Diagnosis of  By: Olevia Perches, MD, Glenetta Hew   . Insomnia 04/04/2014  . Liver metastasis 04/01/2014  . Malnutrition of moderate degree 03/30/2014    05/01/14 albumin 2.8 05/29/14 albumin 3.0   . Metastatic cancer 03/29/2014  . Normocytic anemia 03/30/2014    04/10/14 Hgb 9.3   . Pancreatic cancer 04/06/2014    06/06/14 Oncology: stage IV pancreatic cancer with Mets to liver: CA 19-9 400--194. CT showed more liver metastases. Xeloda 1560m bid x 14days on and 7 days off. 3 cycles of tx and then repeat the scans.    . Pressure ulcer stage II 08/15/2014    08/14/14 L scrotum small PU-not infected-tx initiated.    . Protein-calorie malnutrition, severe  07/05/2014  . SPINAL STENOSIS, LUMBAR 07/06/2007    Qualifier: Diagnosis of  By: NLenna GilfordMD, SDeborra Medina  . Streptococcal bacteremia 07/23/2014  . Transaminitis 03/30/2014    04/10/14 Alkaline phosphatase 126(39-117) 05/01/14 AST 33, ALT 45, alk  Phos 75 05/29/14 AST 64, ALT 70 alk phos 209   . Type 2 diabetes mellitus 05/03/2007    Qualifier: Diagnosis of  By: RDoy MinceLPN, Megan  126/33/35Hgb A1c 7.1    . Weakness generalized 04/02/2014    Assessment: Patient's an 81y.o with metastatic pancreatic cancer on Eliquis PTA for hx DVT who presented to the ED from NH with c/o AMS, generalized pain, and wheezing.  Abx were started for suspected sepsis.  Patient is not able to take oral meds at this time -- to bridge with lovenox while Eliquis is on hold.   - last Eliquis dose (529m was take on 5/27 at 0900. - scr 0.72 (crcl~54) - hgb ok, plt low at 88  Goal of Therapy:  Anti-Xa level 0.6-1 units/ml 4hrs after LMWH dose given Monitor platelets by anticoagulation protocol: Yes   Plan:  - lovenox 7521mQ q12h (first dose at 2100) - cbc q72h  Hally Colella P 12/14/2014,7:29 PM

## 2014-12-14 NOTE — ED Notes (Signed)
Hospitalist at bedside 

## 2014-12-14 NOTE — ED Notes (Signed)
Bed: EZ50 Expected date:  Expected time:  Means of arrival:  Comments: EMS- 81yo M, AMS, Fever

## 2014-12-14 NOTE — Progress Notes (Signed)
ANTIBIOTIC CONSULT NOTE - INITIAL  Pharmacy Consult for zosyn Indication: rule out sepsis  No Known Allergies  Patient Measurements: weight 77 kg, height 69 inches   Vital Signs: Temp: 103.7 F (39.8 C) (05/27 1345) Temp Source: Rectal (05/27 1345) BP: 98/38 mmHg (05/27 1815) Pulse Rate: 65 (05/27 1815) Intake/Output from previous day:   Intake/Output from this shift:    Labs:  Recent Labs  12/14/14 1429  WBC 2.5*  HGB 17.9*  PLT 88*  CREATININE 0.72   CrCl cannot be calculated (Unknown ideal weight.). No results for input(s): VANCOTROUGH, VANCOPEAK, VANCORANDOM, GENTTROUGH, GENTPEAK, GENTRANDOM, TOBRATROUGH, TOBRAPEAK, TOBRARND, AMIKACINPEAK, AMIKACINTROU, AMIKACIN in the last 72 hours.   Microbiology: No results found for this or any previous visit (from the past 720 hour(s)).  Medical History: Past Medical History  Diagnosis Date  . Diverticulosis of colon (without mention of hemorrhage)   . Irritable bowel syndrome   . Personal history of malignant neoplasm of prostate     s/p  radioactive seed implants 2000  . Unspecified hereditary and idiopathic peripheral neuropathy   . Nonischemic cardiomyopathy cardiologist-  dr Aundra Dubin    EF 25%  per last echo 2012 and cardiologist note--  2007-  ef  40-45%;  2008- ef 35-40%;  2010- ef 30-35%;  2012- ef 25%  . Sinus bradycardia   . Hypertension   . Arthritis     hands  . Hyperlipidemia   . History of DVT of lower extremity     2002  ,   left leg  . History of adenomatous polyp of colon     2001  . History of basal cell carcinoma excision   . Anticoagulated on Coumadin   . Type 2 diabetes, diet controlled   . GERD (gastroesophageal reflux disease)   . LBBB (left bundle branch block)   . Wears glasses   . Wears hearing aid     BILATERAL  . Unsteady gait   . DDD (degenerative disc disease)     cervical and lumbar  . DJD (degenerative joint disease)     hips  . Lower urinary tract symptoms (LUTS)   . Bladder  cancer dx  march 2014  papillary urothelial carcinoma    s/p turbt with chemo instillation (urologist--  dr Diona Fanti)  . Coronary artery disease     per cath 2002  non-obstructive cad  . Chronic systolic heart failure   . H/O fracture of nose     12-30-2013-  pt fell at home open nose bone fx  . Abdominal pain 12/20/2013  . Abnormal MRI of abdomen 01/31/2014  . Acute encephalopathy 07/02/2014  . Acute thromboembolism of deep veins of lower extremity 05/03/2007    Qualifier: History of  By: Doy Mince LPN, Megan  63/87/56 Doppler LLE: DVT at left femoral and popliteal veins not completely occlusive. Will anticoagulate with Eliquis 2.94m bid.    . Anemia of chronic disease 02/20/2013  . CARDIOMYOPATHY 07/06/2007    Qualifier: Diagnosis of  By: NLenna GilfordMD, SDeborra Medina  . Constipation 04/04/2014  . Coronary atherosclerosis 07/06/2007    Qualifier: Diagnosis of  By: NLenna GilfordMD, [place]DISEASE 05/03/2007    Qualifier: Diagnosis of  By: RDoy MinceLPN, Megan    . DVT (deep venous thrombosis)   . Edema 05/28/2014  . Failure to thrive in adult 03/30/2014  . Fall in elderly patient 12/30/2013  . Fever 07/20/2014  . Frailty 03/30/2014  . FUO (fever of unknown  origin) 05/28/2014    05/28/14 CXR right lower lobe bronchopneumonia.    Marland Kitchen GERD 05/03/2007    Qualifier: Diagnosis of  By: Doy Mince LPN, Megan    . Hereditary and idiopathic peripheral neuropathy 07/06/2007    Qualifier: Diagnosis of  By: Lenna Gilford MD, Deborra Medina   . HYPERLIPIDEMIA 05/03/2007    Qualifier: Diagnosis of  By: Doy Mince LPN, Megan    . HYPERTENSION, BENIGN 06/23/2010    Qualifier: Diagnosis of  By: Olevia Perches, MD, Glenetta Hew   . Insomnia 04/04/2014  . Liver metastasis 04/01/2014  . Malnutrition of moderate degree 03/30/2014    05/01/14 albumin 2.8 05/29/14 albumin 3.0   . Metastatic cancer 03/29/2014  . Normocytic anemia 03/30/2014    04/10/14 Hgb 9.3   . Pancreatic cancer 04/06/2014    06/06/14 Oncology: stage IV pancreatic  cancer with Mets to liver: CA 19-9 400--194. CT showed more liver metastases. Xeloda 1560m bid x 14days on and 7 days off. 3 cycles of tx and then repeat the scans.    . Pressure ulcer stage II 08/15/2014    08/14/14 L scrotum small PU-not infected-tx initiated.    . Protein-calorie malnutrition, severe 07/05/2014  . SPINAL STENOSIS, LUMBAR 07/06/2007    Qualifier: Diagnosis of  By: NLenna GilfordMD, SDeborra Medina  . Streptococcal bacteremia 07/23/2014  . Transaminitis 03/30/2014    04/10/14 Alkaline phosphatase 126(39-117) 05/01/14 AST 33, ALT 45, alk  Phos 75 05/29/14 AST 64, ALT 70 alk phos 209   . Type 2 diabetes mellitus 05/03/2007    Qualifier: Diagnosis of  By: RDoy MinceLPN, Megan  146/56/81Hgb A1c 7.1    . Weakness generalized 04/02/2014    Medications:  See med rec  Assessment: Patient's an 81y.o M presented to the ED from NH with c/o AMS, generalized pain, and wheezing. He was found to be febrile.  CXR showed increased pulmonary congestion and possible bilateral pulmonary edema.  Pt started on Vancomycin per pharmacy earlier today and now zosyn consult added for concern for cholangitis and SIRS/sepsis.  Note pt with stage IV pancreatic cancer with mets to liver currently on Xeloda.    5/27 >> Vancomycin >> 5/27 >> Zosyn >>  Micro: 5/27: Blood cxs x 2: P 5/27: Urine cx: P  SCr 0.72 WBC LOW Tm24h: 103.7  Goal of Therapy:  Vancomycin trough level 15-20 mcg/ml  Plan:  -Add Zosyn 3.375g IV q8h (infuse over 4 hours)  CRalene Bathe PharmD, BCPS 12/14/2014, 7:05 PM  Pager: [phone]

## 2014-12-14 NOTE — H&P (Signed)
[facility] History and Physical  TARRON KROLAK IAX:[id number] DOB: [birth year] DOA: 12/14/2014  Referring physician: Dr Mingo Amber PCP: Drema Pry, DO   Chief Complaint: Brought in from [facility] for altered mental status.   HPI: [name] [name] is a 81 y.o. male  With h/o DM, hypertension, metastatic pancreatic cancer on xeloda follows with Dr Marin Olp presents to ED with fever, AMS, . History obtained from daughter at bedside, who confirms DNR, but wants vasopressors if needed. He was found to be febrile, hypotensive after a 1.5 lit bolus and hypoxic, requiring 2 to 3 lit of [place] oxygen. Imaging revealed new liver lesions, splenomegaly, ascites, pleural effusions and biliary dilatation. Labs revealed, elevated liver function tests and normal ammonia and lactic acid level. CXR is negative for pneumonia and UA is neg for infection. Daughter reports a fall a week ago. He was referred to medical service for admission.    Review of Systems:  Could not be obtained.   Past Medical History  Diagnosis Date  . Diverticulosis of colon (without mention of hemorrhage)   . Irritable bowel syndrome   . Personal history of malignant neoplasm of prostate     s/p  radioactive seed implants 2000  . Unspecified hereditary and idiopathic peripheral neuropathy   . Nonischemic cardiomyopathy cardiologist-  dr Aundra Dubin    EF 25%  per last echo 2012 and cardiologist note--  2007-  ef  40-45%;  2008- ef 35-40%;  2010- ef 30-35%;  2012- ef 25%  . Sinus bradycardia   . Hypertension   . Arthritis     hands  . Hyperlipidemia   . History of DVT of lower extremity     2002  ,   left leg  . History of adenomatous polyp of colon     2001  . History of basal cell carcinoma excision   . Anticoagulated on Coumadin   . Type 2 diabetes, diet controlled   . GERD (gastroesophageal reflux disease)   . LBBB (left bundle branch block)   . Wears glasses   . Wears hearing aid     BILATERAL  . Unsteady gait   . DDD  (degenerative disc disease)     cervical and lumbar  . DJD (degenerative joint disease)     hips  . Lower urinary tract symptoms (LUTS)   . 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 Coronary artery disease     per cath 2002  non-obstructive cad  . Chronic systolic heart failure   . H/O fracture of nose     12-30-2013-  pt fell at home open nose bone fx  . Abdominal pain 12/20/2013  . Abnormal MRI of abdomen 01/31/2014  . Acute encephalopathy 07/02/2014  . Acute thromboembolism of deep veins of lower extremity 05/03/2007    Qualifier: History of  By: Doy Mince LPN, Megan  07/86/75 Doppler LLE: DVT at left femoral and popliteal veins not completely occlusive. Will anticoagulate with Eliquis 2.45m bid.    . Anemia of chronic disease 02/20/2013  . CARDIOMYOPATHY 07/06/2007    Qualifier: Diagnosis of  By: NLenna GilfordMD, SDeborra Medina  . Constipation 04/04/2014  . Coronary atherosclerosis 07/06/2007    Qualifier: Diagnosis of  By: NLenna GilfordMD, SWest CarthageDISEASE 05/03/2007    Qualifier: Diagnosis of  By: RDoy MinceLPN, Megan    . DVT (deep venous thrombosis)   .  Edema 05/28/2014  . Failure to thrive in adult 03/30/2014  . Fall in elderly patient 12/30/2013  . Fever 07/20/2014  . Frailty 03/30/2014  . FUO (fever of unknown origin) 05/28/2014    05/28/14 CXR right lower lobe bronchopneumonia.    Marland Kitchen GERD 05/03/2007    Qualifier: Diagnosis of  By: Doy Mince LPN, Megan    . Hereditary and idiopathic peripheral neuropathy 07/06/2007    Qualifier: Diagnosis of  By: Lenna Gilford MD, Deborra Medina   . HYPERLIPIDEMIA 05/03/2007    Qualifier: Diagnosis of  By: Doy Mince LPN, Megan    . HYPERTENSION, BENIGN 06/23/2010    Qualifier: Diagnosis of  By: Olevia Perches, MD, Glenetta Hew   . Insomnia 04/04/2014  . Liver metastasis 04/01/2014  . Malnutrition of moderate degree 03/30/2014    05/01/14 albumin 2.8 05/29/14 albumin 3.0   .  Metastatic cancer 03/29/2014  . Normocytic anemia 03/30/2014    04/10/14 Hgb 9.3   . Pancreatic cancer 04/06/2014    06/06/14 Oncology: stage IV pancreatic cancer with Mets to liver: CA 19-9 400--194. CT showed more liver metastases. Xeloda 1562m bid x 14days on and 7 days off. 3 cycles of tx and then repeat the scans.    . Pressure ulcer stage II 08/15/2014    08/14/14 L scrotum small PU-not infected-tx initiated.    . Protein-calorie malnutrition, severe 07/05/2014  . SPINAL STENOSIS, LUMBAR 07/06/2007    Qualifier: Diagnosis of  By: NLenna GilfordMD, SDeborra Medina  . Streptococcal bacteremia 07/23/2014  . Transaminitis 03/30/2014    04/10/14 Alkaline phosphatase 126(39-117) 05/01/14 AST 33, ALT 45, alk  Phos 75 05/29/14 AST 64, ALT 70 alk phos 209   . Type 2 diabetes mellitus 05/03/2007    Qualifier: Diagnosis of  By: RDoy MinceLPN, Megan  173/71/06Hgb A1c 7.1    . Weakness generalized 04/02/2014   Past Surgical History  Procedure Laterality Date  . Knee arthroscopy Left 1991  . Ercp with sphincterotomy and stone removal  05/ 1993  &  05-11-2006  . Total knee arthroplasty  10/05/2011    Procedure: TOTAL KNEE ARTHROPLASTY;  Surgeon: FGearlean Alf MD;  Location: [facility];  Service: Orthopedics;  Laterality: Right;  . Transurethral resection of bladder tumor N/A 09/30/2012    Procedure: TRANSURETHRAL RESECTION OF BLADDER TUMOR (TURBT);  Surgeon: SFranchot Gallo MD;  Location: [facility];  Service: Urology;  Laterality: N/A;  WITH MITOMYCIN INSTILLATION   . Eus N/A 01/04/2014    Procedure: ESOPHAGEAL ENDOSCOPIC ULTRASOUND (EUS) RADIAL;  Surgeon: DMilus Banister MD;  Location: [facility];  Service: Endoscopy;  Laterality: N/A;  . Radioactive prostate seed implants  01/ 2000  . Mohs surgery  03/ 2009    LEFT EAR BASAL CELL CARCINOMA  . Tonsillectomy  AS CHILD  . Cholecystectomy open  1985  . Appendectomy  child  . Inguinal hernia repair Bilateral 1998    recurrent RIH  w/ repair 06-21-2006  . Total knee  arthroplasty Left 02-21-2007    AND 04-21-2007  CLOSED MANIPULATION  . Cataract extraction w/ intraocular lens implant Right 2004  . Lumbar spine surgery  X2  . Cardiac catheterization  06-14-2001  dr bDarnell Levelbrodie    non-obstructive CAD/  LM 30%,  pLAD 50%,  mLAD 30%,  RCA  &  CFX  irregularites ,  global hypokinesis,  ef 40%  . Cardiovascular stress test  02-07-2007   dr bDarnell Levelbrodie    negative for ischemia,  low risk myoview perfusion study  .  Transthoracic echocardiogram  01-28-2011    moderate LVH/  ef 25%/  diffuse hypokinesis/  mild LAE/  mild TR/  trivial AR & MR  . Cystoscopy w/ retrogrades Bilateral 03/09/2014    Procedure: CYSTOSCOPY WITH BILATERAL RETROGRADE PYELOGRAM BLADDER BX AND RENAL WASHINGS.;  Surgeon: Jorja Loa, MD;  [facility];  Service: Urology;  Laterality: Bilateral;  . Eus N/A 04/05/2014    Procedure: UPPER ENDOSCOPIC ULTRASOUND (EUS) LINEAR;  Surgeon: [name], MD;  Location: [facility];  Service: Endoscopy;  Laterality: N/A;  radial linear  . Endoscopic retrograde cholangiopancreatography (ercp) with propofol N/A 04/12/2014    Procedure: ENDOSCOPIC RETROGRADE CHOLANGIOPANCREATOGRAPHY (ERCP) WITH PROPOFOL;  Surgeon: [name], MD;  Location: [facility];  Service: Endoscopy;  Laterality: N/A;  metal stent  . Biliary stent placement N/A 04/12/2014    Procedure: BILIARY STENT PLACEMENT;  Surgeon: [name], MD;  Location: [facility];  Service: Endoscopy;  Laterality: N/A;   Social History:  reports that he quit smoking about 32 years ago. His smoking use included Cigarettes. He started smoking about 60 years ago. He has a 10 pack-year smoking history. He has never used smokeless tobacco. He reports that he does not drink alcohol or use illicit drugs.  No Known Allergies  Family History  Problem Relation Age of Onset  . Throat cancer Brother   . Coronary artery disease Father     deceased    Prior to  Admission medications   Medication Sig Start Date End Date Taking? Authorizing Provider  acetaminophen (TYLENOL) 325 MG tablet Take by mouth every 6 (six) hours as needed for fever (For fever greater than 101. Give one dose).   Yes Historical Provider, MD  apixaban (ELIQUIS) 5 MG TABS tablet Take 1 tablet (5 mg total) by mouth 2 (two) times daily. 07/23/14  Yes Bonnielee Haff, MD  bisacodyl (DULCOLAX) 10 MG suppository Place 1 suppository (10 mg total) rectally daily as needed for moderate constipation. 04/03/14  Yes Christina P Rama, MD  capecitabine (XELODA) 500 MG tablet Take 3 tablets (1,500 mg total) by mouth 2 (two) times daily after a meal. TAKES 3 TABS (1500) MG BID  ///  ON 14 DAYS OFF 7 DAYS FOR 3 CYCLES.   STARTED 06/06/14 Patient taking differently: Take 1,500 mg by mouth 2 (two) times daily after a meal. Take 3 tablets (1586m) by mouth twice daily. Give for 14 days, Off for seven days 07/06/14  Yes DEugenie Filler MD  carvedilol (COREG) 12.5 MG tablet Take 12.5 mg by mouth 2 (two) times daily with a meal.   Yes Historical Provider, MD  docusate sodium 100 MG CAPS Take 100 mg by mouth 2 (two) times daily. 04/03/14  Yes Christina P Rama, MD  feeding supplement, GLUCERNA SHAKE, (GLUCERNA SHAKE) LIQD Take 237 mLs by mouth 2 (two) times daily between meals as needed (If pt with <50% meal completion). 07/06/14  Yes DEugenie Filler MD  HYDROcodone-acetaminophen ([facility] 10-325 MG per tablet Take one tablet by mouth every 4 hours as needed for pain 12/03/14  Yes AHennie Duos MD  insulin glargine (LANTUS) 100 UNIT/ML injection Inject 3 Units into the skin at bedtime.   Yes Historical Provider, MD  lactulose (CHRONULAC) 10 GM/15ML solution Take 20 g by mouth daily as needed for mild constipation (356mas needed for constipation).    Yes Historical Provider, MD  lidocaine-prilocaine (EMLA) cream Apply 1 application topically 6 (six) times daily. Apply topically  every 4 hours as needed for pain    Yes Historical Provider, MD  LORazepam (ATIVAN) 0.5 MG tablet Take 2 tablets (1 mg total) by mouth every 6 (six) hours as needed (nausea/vomiting). Patient taking differently: Take 0.5-1 mg by mouth See admin instructions.  07/06/14  Yes Eugenie Filler, MD  mirtazapine (REMERON) 7.5 MG tablet Take 7.5 mg by mouth at bedtime.   Yes Historical Provider, MD  morphine (ROXANOL) 20 MG/ML concentrated solution Take 5 mg by mouth every 2 (two) hours as needed for severe pain (Give 0.71m=5mg).   Yes Historical Provider, MD  Multiple Vitamin (MULTIVITAMIN WITH MINERALS) TABS tablet Take 1 tablet by mouth daily. 07/23/14  Yes GBonnielee Haff MD  Multiple Vitamins-Minerals (PRESERVISION AREDS 2) CAPS Take 1 capsule by mouth 2 (two) times daily.   Yes Historical Provider, MD  nitroGLYCERIN (NITROSTAT) 0.4 MG SL tablet Place 1 tablet (0.4 mg total) under the tongue every 5 (five) minutes as needed for chest pain. 11/22/13  Yes JThompson Grayer MD  Omega-3 Fatty Acids (FISH OIL) 1000 MG CAPS Take 1 capsule by mouth.   Yes Historical Provider, MD  ondansetron (ZOFRAN) 8 MG tablet Take by mouth 2 (two) times daily as needed for nausea or vomiting.    Yes Historical Provider, MD  oxyCODONE (OXY IR/ROXICODONE) 5 MG immediate release tablet Take one tablet by mouth twice daily for pain 09/11/14  Yes Mahima Pandey, MD  polyethylene glycol (MIRALAX / GLYCOLAX) packet Take 17 g by mouth 2 (two) times daily.   Yes Historical Provider, MD  polyvinyl alcohol (LIQUIFILM TEARS) 1.4 % ophthalmic solution Place 1 drop into both eyes 4 (four) times daily as needed for dry eyes.   Yes Historical Provider, MD  pravastatin (PRAVACHOL) 40 MG tablet Take 40 mg by mouth every evening.    Yes Historical Provider, MD  pregabalin (LYRICA) 75 MG capsule Take 75 mg by mouth daily.   Yes Historical Provider, MD  PRESCRIPTION MEDICATION    Yes Historical Provider, MD  prochlorperazine (COMPAZINE) 5 MG tablet Take 5 mg by mouth every 6 (six) hours  as needed for nausea or vomiting.   Yes Historical Provider, MD  ranitidine (ZANTAC) 150 MG tablet Take 150 mg by mouth 2 (two) times daily.   Yes Historical Provider, MD  temazepam (RESTORIL) 15 MG capsule Take 15 mg by mouth at bedtime.    Yes Historical Provider, MD   Physical Exam: Filed Vitals:   12/14/14 1700 12/14/14 1730 12/14/14 1805 12/14/14 1815  BP: 108/53 102/48 91/39 98/38   Pulse: 71 72 66 65  Temp:      TempSrc:      Resp: 16 19 18 12   SpO2: 100% 99% 99% 100%    Wt Readings from Last 3 Encounters:  11/26/14 77.111 kg (170 lb)  11/05/14 77.111 kg (170 lb)  10/08/14 76.658 kg (169 lb)    General: a ppears encephalopathic. Responding to sternal rub only Eyes: PERRL, normal lids, irises & conjunctiva Neck: no LAD, masses or thyromegaly Cardiovascular: regular, pedal edema.  Respiratory: diminished air entry at bedside. No wheezing or rhonchi Abdomen: soft, mildly distended, bowel sounds heard. Does not appear to have tenderness.  Skin: pressure sores in the back.  Musculoskeletal: 1+ pedal edema.  Neurologic: encephalopathic..full neuro exam could not be performed.           Labs on Admission:  Basic Metabolic Panel:  Recent Labs Lab 12/14/14 1429  NA 138  K 4.4  CL 100*  CO2 31  GLUCOSE 148*  BUN 17  CREATININE 0.72  CALCIUM 8.6*   Liver Function Tests:  Recent Labs Lab 12/14/14 1429  AST 357*  ALT 79*  ALKPHOS 673*  BILITOT 2.5*  PROT 6.3*  ALBUMIN 2.9*   No results for input(s): LIPASE, AMYLASE in the last 168 hours. No results for input(s): AMMONIA in the last 168 hours. CBC:  Recent Labs Lab 12/14/14 1429  WBC 2.5*  HGB 17.9*  HCT 54.4*  MCV 106.0*  PLT 88*   Cardiac Enzymes: No results for input(s): CKTOTAL, CKMB, CKMBINDEX, TROPONINI in the last 168 hours.  BNP (last 3 results)  Recent Labs  12/14/14 1429  BNP 483.1*    ProBNP (last 3 results) No results for input(s): PROBNP in the last 8760  hours.  CBG:  Recent Labs Lab 12/14/14 1407 12/14/14 1817  GLUCAP 138* 143*    Radiological Exams on Admission: Dg Wrist Complete Left  12/14/2014   CLINICAL DATA:  Altered mental status, 2 falls in the last 2 weeks, left wrist swelling  EXAM: LEFT WRIST - COMPLETE 3+ VIEW  COMPARISON:  None.  FINDINGS: Four views of the left wrist submitted. No acute fracture or subluxation. There is diffuse osteopenia. Narrowing of radiocarpal joint space. Chondrocalcinosis is noted. Degenerative changes first carpometacarpal joint. Degenerative changes anterior aspect of navicular.  IMPRESSION: No acute fracture or subluxation. Chondrocalcinosis. Degenerative changes as described above. Diffuse osteopenia.   Electronically Signed   By: Lahoma Crocker M.D.   On: 12/14/2014 16:32   Ct Head Wo Contrast  12/14/2014   CLINICAL DATA:  Confusion, fever abnormal liver function. History of pancreatic carcinoma.  EXAM: CT HEAD WITHOUT CONTRAST  TECHNIQUE: Contiguous axial images were obtained from the base of the skull through the vertex without intravenous contrast.  COMPARISON:  12/30/2013  FINDINGS: Stable cortical atrophy and periventricular white matter small vessel ischemic changes. The brain demonstrates no evidence of hemorrhage, infarction, edema, mass effect, extra-axial fluid collection, hydrocephalus or mass lesion. The skull is unremarkable.  IMPRESSION: No acute findings.  Stable atrophy and small vessel disease.   Electronically Signed   By: Aletta Edouard M.D.   On: 12/14/2014 16:53   Ct Abdomen Pelvis W Contrast  12/14/2014   CLINICAL DATA:  History of pancreas cancer with worsening liver function  EXAM: CT ABDOMEN AND PELVIS WITH CONTRAST  TECHNIQUE: Multidetector CT imaging of the abdomen and pelvis was performed using the standard protocol following bolus administration of intravenous contrast.  CONTRAST:  141m OMNIPAQUE IOHEXOL 300 MG/ML  SOLN  COMPARISON:  11/26/2014  FINDINGS: Lower chest: Small  bilateral pleural effusions are increased in volume from previous exam. There is overlying compressive type atelectasis and consolidation.  Hepatobiliary: Moderate intrahepatic biliary ductal dilatation with pneumobilia is again identified and appears similar to the previous exam. Stable low-attenuation lesions in segment 3 and segment 6 of the liver compatible with small cysts. At this time there there is a subtle, low-attenuation structure within segment 7 of the liver which measures 1.2 cm, image 7/series 2. This is similar to previous examination in may represent an area of metastatic disease. Other previously demonstrated areas of liver metastasis are not well seen on the current exam. The common bile duct stent appears similarly pleasant shins than on the previous exam. On the current exam the common bile duct measures 2.8 cm, image 40/ series 4. Previously 2.1 cm  Pancreas: No discrete pancreas mass identified. Fatty replacement of the pancreatic parenchyma is identified.  Spleen: The spleen measures 14 cm in length, image 57 of series 4. Previously 11 cm. No focal splenic abnormality noted.  Adrenals/Urinary Tract: The adrenal glands are both normal. Unremarkable appearance of the kidneys.  Stomach/Bowel: The stomach is normal. The small bowel loops have a normal course and caliber. No evidence for obstruction. Normal appearance of the colon stress set normal appearance of the proximal colon. There are multiple sigmoid diverticula without acute inflammation.  Vascular/Lymphatic: Calcified atherosclerotic disease involves the abdominal aorta. No aneurysm. No enlarged retroperitoneal or mesenteric adenopathy. No enlarged pelvic or inguinal lymph nodes.  Reproductive: Brachytherapy implants throughout the prostate gland noted. Seminal vesicles are atrophic.  Other: Small amount of ascites is identified within the abdomen and pelvis. New from previous exam.  Musculoskeletal: Scattered well defined sclerotic foci  are identified within the osseous structures and are favored to represent small bone islands. No aggressive lytic or sclerotic bone lesions identified.  IMPRESSION: 1. With respect to the patient's pancreatic malignancy findings on today's are exam are not significantly changed compared with previous study. There is a lesion within the dome of liver which is unchanged from 09/17/2014 in may represent an area of liver metastasis. This was not well seen on study from 11/26/2014. 2. Biliary dilatation and pneumobilia. The degree of intrahepatic bile duct dilatation is not significantly changed. There has been mild increase caliber of the common bile duct. 3. New splenomegaly and ascites. Findings may reflect changes of portal venous hypertension secondary to patient's worsening liver function. 4. Increase in volume of bilateral pleural effusions.   Electronically Signed   By: Kerby Moors M.D.   On: 12/14/2014 17:02   Dg Chest Port 1 View  12/14/2014   CLINICAL DATA:  Shortness of breath.  EXAM: PORTABLE CHEST - 1 VIEW  COMPARISON:  July 19, 2014.  FINDINGS: Stable cardiomediastinal silhouette. No pneumothorax or pleural effusion is noted. Stable right internal jugular Port-A-Cath is noted with distal tip overlying expected position of SVC. Increased central pulmonary vascular congestion is noted as well as bilateral interstitial densities concerning for pulmonary edema. Bony thorax is intact.  IMPRESSION: Increased central pulmonary vascular congestion is noted with probable bilateral pulmonary edema.   Electronically Signed   By: Marijo Conception, M.D.   On: 12/14/2014 14:38    EKG: sinus with prolonged PR interval and ATYPICAL LBBB, unchanged from before .   Assessment/Plan Active Problems:   Fever   SIRS (systemic inflammatory response syndrome)   1. SIRS: unclear etiology, unknown source. Blood cultures ordered and started him on broad spectrum antibiotics with vancomycin and cefepime. Fluid  resuscitation with 1 lit bolus followed by maintenance fluids at 75 /hr. Spoke to Dr Oletta Darter , PCCM, and discussed the case,. To call PCCM if patient does not respond to fluid resuscitation.  2. Elevated liver function panel/ splenomegaly, ascites : probably from liver metastases . Ammonia level is normal.   3. Hypoxia probably from pleural effusions:  Nasal canula oxygen as needed to keep sats greater than 90%.   4. Metastatic pancreatic cancer:  Will notify oncology of the patients admission.   5. H/o Bilateral DVT: resume eliquis when alert and able to take po. For now we will do lovenox injections.    6. Diabetes mellitus: SSI.    6. Thrombocytopenia: probably from liver dysfunction.    POOR Prognosis.   Code Status: DNR DVT Prophylaxis: Family Communication: discussed with daughter at bedside Disposition Plan: admit to step down  Time spent: 45  min  [facility] Pager [phone]

## 2014-12-14 NOTE — ED Notes (Signed)
Pt has redness on buttocks.

## 2014-12-15 DIAGNOSIS — G9340 Encephalopathy, unspecified: Secondary | ICD-10-CM

## 2014-12-15 DIAGNOSIS — C259 Malignant neoplasm of pancreas, unspecified: Secondary | ICD-10-CM

## 2014-12-15 DIAGNOSIS — R509 Fever, unspecified: Secondary | ICD-10-CM

## 2014-12-15 LAB — CBC
HEMATOCRIT: 25.9 % — AB (ref 39.0–52.0)
Hemoglobin: 8.4 g/dL — ABNORMAL LOW (ref 13.0–17.0)
MCH: 34.7 pg — ABNORMAL HIGH (ref 26.0–34.0)
MCHC: 32.4 g/dL (ref 30.0–36.0)
MCV: 107 fL — ABNORMAL HIGH (ref 78.0–100.0)
Platelets: 212 10*3/uL (ref 150–400)
RBC: 2.42 MIL/uL — AB (ref 4.22–5.81)
RDW: 20.6 % — AB (ref 11.5–15.5)
WBC: 7.1 10*3/uL (ref 4.0–10.5)

## 2014-12-15 LAB — COMPREHENSIVE METABOLIC PANEL
ALBUMIN: 2.6 g/dL — AB (ref 3.5–5.0)
ALK PHOS: 510 U/L — AB (ref 38–126)
ALT: 79 U/L — AB (ref 17–63)
AST: 171 U/L — ABNORMAL HIGH (ref 15–41)
Anion gap: 6 (ref 5–15)
BILIRUBIN TOTAL: 4.4 mg/dL — AB (ref 0.3–1.2)
BUN: 14 mg/dL (ref 6–20)
CALCIUM: 8 mg/dL — AB (ref 8.9–10.3)
CO2: 27 mmol/L (ref 22–32)
Chloride: 103 mmol/L (ref 101–111)
Creatinine, Ser: 0.73 mg/dL (ref 0.61–1.24)
GFR calc non Af Amer: >60 mL/min (ref >60)
GLUCOSE: 148 mg/dL — AB (ref 65–99)
POTASSIUM: 4.3 mmol/L (ref 3.5–5.1)
Sodium: 136 mmol/L (ref 135–145)
Total Protein: 6 g/dL — ABNORMAL LOW (ref 6.5–8.1)

## 2014-12-15 LAB — GLUCOSE, CAPILLARY
GLUCOSE-CAPILLARY: 240 mg/dL — AB (ref 65–99)
Glucose-Capillary: 147 mg/dL — ABNORMAL HIGH (ref 65–99)
Glucose-Capillary: 155 mg/dL — ABNORMAL HIGH (ref 65–99)
Glucose-Capillary: 200 mg/dL — ABNORMAL HIGH (ref 65–99)
Glucose-Capillary: 209 mg/dL — ABNORMAL HIGH (ref 65–99)

## 2014-12-15 LAB — URINE CULTURE
COLONY COUNT: NO GROWTH
Culture: NO GROWTH

## 2014-12-15 LAB — PROTIME-INR
INR: 2.44 — ABNORMAL HIGH (ref 0.00–1.49)
Prothrombin Time: 26.2 seconds — ABNORMAL HIGH (ref 11.6–15.2)

## 2014-12-15 MED ORDER — POLYVINYL ALCOHOL 1.4 % OP SOLN
1.0000 [drp] | Freq: Four times a day (QID) | OPHTHALMIC | Status: DC | PRN
  Administered 2014-12-15 – 2014-12-16 (×2): 1 [drp] via OPHTHALMIC
  Filled 2014-12-15: qty 15

## 2014-12-15 MED ORDER — PREGABALIN 75 MG PO CAPS
75.0000 mg | ORAL_CAPSULE | Freq: Every day | ORAL | Status: DC
  Administered 2014-12-15 – 2014-12-18 (×4): 75 mg via ORAL
  Filled 2014-12-15 (×4): qty 1

## 2014-12-15 MED ORDER — APIXABAN 5 MG PO TABS
5.0000 mg | ORAL_TABLET | Freq: Two times a day (BID) | ORAL | Status: DC
  Administered 2014-12-15 – 2014-12-18 (×6): 5 mg via ORAL
  Filled 2014-12-15 (×6): qty 1

## 2014-12-15 MED ORDER — MORPHINE SULFATE (CONCENTRATE) 10 MG /0.5 ML PO SOLN: 5.0000 mg | ORAL | Status: DC | PRN

## 2014-12-15 MED ORDER — GLUCERNA SHAKE PO LIQD
237.0000 mL | Freq: Two times a day (BID) | ORAL | Status: DC | PRN
  Filled 2014-12-15: qty 237

## 2014-12-15 MED ORDER — TEMAZEPAM 15 MG PO CAPS
15.0000 mg | ORAL_CAPSULE | Freq: Every evening | ORAL | Status: DC | PRN
  Administered 2014-12-15 – 2014-12-17 (×3): 15 mg via ORAL
  Filled 2014-12-15 (×3): qty 1

## 2014-12-15 MED ORDER — POLYETHYLENE GLYCOL 3350 17 G PO PACK
17.0000 g | PACK | Freq: Two times a day (BID) | ORAL | Status: DC
  Administered 2014-12-15 – 2014-12-18 (×6): 17 g via ORAL
  Filled 2014-12-15 (×6): qty 1

## 2014-12-15 MED ORDER — LACTULOSE 10 GM/15ML PO SOLN
20.0000 g | Freq: Every day | ORAL | Status: DC | PRN
  Filled 2014-12-15: qty 30

## 2014-12-15 MED ORDER — FAMOTIDINE 20 MG PO TABS
20.0000 mg | ORAL_TABLET | Freq: Every day | ORAL | Status: DC
  Administered 2014-12-15 – 2014-12-18 (×4): 20 mg via ORAL
  Filled 2014-12-15 (×5): qty 1

## 2014-12-15 MED ORDER — OXYCODONE HCL 5 MG PO TABS
5.0000 mg | ORAL_TABLET | Freq: Four times a day (QID) | ORAL | Status: DC | PRN
  Administered 2014-12-15 – 2014-12-18 (×5): 5 mg via ORAL
  Filled 2014-12-15 (×5): qty 1

## 2014-12-15 MED ORDER — APIXABAN 5 MG PO TABS: 5.0000 mg | ORAL_TABLET | Freq: Two times a day (BID) | ORAL | Status: DC

## 2014-12-15 MED ORDER — HYDROCORTISONE NA SUCCINATE PF 100 MG IJ SOLR
50.0000 mg | Freq: Three times a day (TID) | INTRAMUSCULAR | Status: DC
  Administered 2014-12-15 – 2014-12-16 (×2): 50 mg via INTRAVENOUS
  Filled 2014-12-15 (×3): qty 1

## 2014-12-15 MED ORDER — MORPHINE SULFATE (CONCENTRATE) 10 MG/0.5ML PO SOLN
5.0000 mg | ORAL | Status: DC | PRN
  Administered 2014-12-15 – 2014-12-16 (×2): 5 mg via ORAL
  Filled 2014-12-15 (×2): qty 0.5

## 2014-12-15 NOTE — Progress Notes (Signed)
Patient continues to be bradycardic with short periods into the upper 30's. [facility] NP on call reviewed EKG and determined that no significant changes are noted. Orders to continue to monitor for increased bradycardia or a decrease in blood pressure. Will continue to monitor both and report any changes as necessary. S.Helina Hullum, RN

## 2014-12-15 NOTE — Progress Notes (Signed)
Patient experiencing bradycardia consistently in the 40's and 50's with brief periods in the upper 30's. [facility] NP on call paged and notified of bradycardia. Will continue to monitor and await further orders

## 2014-12-15 NOTE — Progress Notes (Signed)
[facility] PROGRESS NOTE  [name] [name] PPJ:[id number] DOB: [birth year] DOA: 12/14/2014 PCP: Drema Pry, DO  Assessment/Plan: 1. SIRS: ? Sepsis. Initially admitted to step down. Started on broad spectrum antibiotics and fluid resuscitation. His fever has resolved and his bp improved. Blood cultures are negative so far. Would resume antibiotics for 48 hours more and transition to oral antibiotics.  Taper stress dose steroids as deemed appropriate.  2. Elevated liver function tests: improving.  3. Hypoxia: plan to wean him off oxygen in the 24 hours. No pneumonia found on CXR.  4. Metastatic Pancreatic Cancer: xeloda being held for now, in view of yesterday's fever and SIRS.  5. Acute encephalopathy: unclear etiology. Resolved. Start regular diet.    Code Status: DNR Family Communication: daughter at bedside Disposition Plan: pending.    Consultants: Curb side with Dr Oletta Darter PCCM  Procedures:  CT ABDOMEN AND PELVIS.   Antibiotics:  Vancomycin  Zosyn 5/27  HPI/Subjective: He is alert and conversing, daughter at bedside. Patient requesting to eat.   Objective: Filed Vitals:   12/15/14 1400  BP: 103/43  Pulse: 62  Temp:   Resp: 16    Intake/Output Summary (Last 24 hours) at 12/15/14 1439 Last data filed at 12/15/14 1400  Gross per 24 hour  Intake   2530 ml  Output    475 ml  Net   2055 ml   Filed Weights   12/14/14 2000  Weight: 78.9 kg (173 lb 15.1 oz)    Exam:   General:  Alert afebrile comfortable  Cardiovascular: s1s2  Respiratory: ctab, no wheezing heard.   Abdomen: soft non tender non distended bowel sounds heard.   Musculoskeletal: 1+ edema.   Data Reviewed: Basic Metabolic Panel:  Recent Labs Lab 12/14/14 1429 12/15/14 0335  NA 138 136  K 4.4 4.3  CL 100* 103  CO2 31 27  GLUCOSE 148* 148*  BUN 17 14  CREATININE 0.72 0.73  CALCIUM 8.6* 8.0*   Liver Function Tests:  Recent Labs Lab 12/14/14 1429 12/15/14 0335  AST 357*  171*  ALT 79* 79*  ALKPHOS 673* 510*  BILITOT 2.5* 4.4*  PROT 6.3* 6.0*  ALBUMIN 2.9* 2.6*   No results for input(s): LIPASE, AMYLASE in the last 168 hours.  Recent Labs Lab 12/14/14 1820  AMMONIA 16   CBC:  Recent Labs Lab 12/14/14 1429 12/15/14 0335  WBC 2.5* 7.1  HGB 17.9* 8.4*  HCT 54.4* 25.9*  MCV 106.0* 107.0*  PLT 88* 212   Cardiac Enzymes: No results for input(s): CKTOTAL, CKMB, CKMBINDEX, TROPONINI in the last 168 hours. BNP (last 3 results)  Recent Labs  12/14/14 1429  BNP 483.1*    ProBNP (last 3 results) No results for input(s): PROBNP in the last 8760 hours.  CBG:  Recent Labs Lab 12/14/14 1407 12/14/14 1817 12/15/14 0056 12/15/14 0824 12/15/14 1211  GLUCAP 138* 143* 147* 155* 200*    Recent Results (from the past 240 hour(s))  Blood culture (routine x 2)     Status: None (Preliminary result)   Collection Time: 12/14/14  2:20 PM  Result Value Ref Range Status   Specimen Description BLOOD RFARM  Final   Special Requests BOTTLES DRAWN AEROBIC AND ANAEROBIC 5CC  Final   Culture   Final           BLOOD CULTURE RECEIVED NO GROWTH TO DATE CULTURE WILL BE HELD FOR 5 DAYS BEFORE ISSUING A FINAL NEGATIVE REPORT Performed at [facility]    Report  Status PENDING  Incomplete  Blood culture (routine x 2)     Status: None (Preliminary result)   Collection Time: 12/24/2014  3:40 PM  Result Value Ref Range Status   Specimen Description BLOOD RAC  Final   Special Requests BOTTLES DRAWN AEROBIC AND ANAEROBIC 5CC  Final   Culture   Final           BLOOD CULTURE RECEIVED NO GROWTH TO DATE CULTURE WILL BE HELD FOR 5 DAYS BEFORE ISSUING A FINAL NEGATIVE REPORT Performed at [facility]    Report Status PENDING  Incomplete  MRSA PCR Screening     Status: None   Collection Time: 24-Dec-2014  7:10 PM  Result Value Ref Range Status   MRSA by PCR NEGATIVE NEGATIVE Final    Comment:        The GeneXpert MRSA Assay (FDA approved for NASAL  specimens only), is one component of a comprehensive MRSA colonization surveillance program. It is not intended to diagnose MRSA infection nor to guide or monitor treatment for MRSA infections.      Studies: Dg Wrist Complete Left  December 24, 2014   CLINICAL DATA:  Altered mental status, 2 falls in the last 2 weeks, left wrist swelling  EXAM: LEFT WRIST - COMPLETE 3+ VIEW  COMPARISON:  None.  FINDINGS: Four views of the left wrist submitted. No acute fracture or subluxation. There is diffuse osteopenia. Narrowing of radiocarpal joint space. Chondrocalcinosis is noted. Degenerative changes first carpometacarpal joint. Degenerative changes anterior aspect of navicular.  IMPRESSION: No acute fracture or subluxation. Chondrocalcinosis. Degenerative changes as described above. Diffuse osteopenia.   Electronically Signed   By: Lahoma Crocker M.D.   On: 2014/12/24 16:32   Ct Head Wo Contrast  2014-12-24   CLINICAL DATA:  Confusion, fever abnormal liver function. History of pancreatic carcinoma.  EXAM: CT HEAD WITHOUT CONTRAST  TECHNIQUE: Contiguous axial images were obtained from the base of the skull through the vertex without intravenous contrast.  COMPARISON:  12/30/2013  FINDINGS: Stable cortical atrophy and periventricular white matter small vessel ischemic changes. The brain demonstrates no evidence of hemorrhage, infarction, edema, mass effect, extra-axial fluid collection, hydrocephalus or mass lesion. The skull is unremarkable.  IMPRESSION: No acute findings.  Stable atrophy and small vessel disease.   Electronically Signed   By: Aletta Edouard M.D.   On: Dec 24, 2014 16:53   Ct Abdomen Pelvis W Contrast  12/24/14   CLINICAL DATA:  History of pancreas cancer with worsening liver function  EXAM: CT ABDOMEN AND PELVIS WITH CONTRAST  TECHNIQUE: Multidetector CT imaging of the abdomen and pelvis was performed using the standard protocol following bolus administration of intravenous contrast.  CONTRAST:   15mL OMNIPAQUE IOHEXOL 300 MG/ML  SOLN  COMPARISON:  11/26/2014  FINDINGS: Lower chest: Small bilateral pleural effusions are increased in volume from previous exam. There is overlying compressive type atelectasis and consolidation.  Hepatobiliary: Moderate intrahepatic biliary ductal dilatation with pneumobilia is again identified and appears similar to the previous exam. Stable low-attenuation lesions in segment 3 and segment 6 of the liver compatible with small cysts. At this time there there is a subtle, low-attenuation structure within segment 7 of the liver which measures 1.2 cm, image 7/series 2. This is similar to previous examination in may represent an area of metastatic disease. Other previously demonstrated areas of liver metastasis are not well seen on the current exam. The common bile duct stent appears similarly pleasant shins than on the previous exam. On the current  exam the common bile duct measures 2.8 cm, image 40/ series 4. Previously 2.1 cm  Pancreas: No discrete pancreas mass identified. Fatty replacement of the pancreatic parenchyma is identified.  Spleen: The spleen measures 14 cm in length, image 57 of series 4. Previously 11 cm. No focal splenic abnormality noted.  Adrenals/Urinary Tract: The adrenal glands are both normal. Unremarkable appearance of the kidneys.  Stomach/Bowel: The stomach is normal. The small bowel loops have a normal course and caliber. No evidence for obstruction. Normal appearance of the colon stress set normal appearance of the proximal colon. There are multiple sigmoid diverticula without acute inflammation.  Vascular/Lymphatic: Calcified atherosclerotic disease involves the abdominal aorta. No aneurysm. No enlarged retroperitoneal or mesenteric adenopathy. No enlarged pelvic or inguinal lymph nodes.  Reproductive: Brachytherapy implants throughout the prostate gland noted. Seminal vesicles are atrophic.  Other: Small amount of ascites is identified within the  abdomen and pelvis. New from previous exam.  Musculoskeletal: Scattered well defined sclerotic foci are identified within the osseous structures and are favored to represent small bone islands. No aggressive lytic or sclerotic bone lesions identified.  IMPRESSION: 1. With respect to the patient's pancreatic malignancy findings on today's are exam are not significantly changed compared with previous study. There is a lesion within the dome of liver which is unchanged from 09/17/2014 in may represent an area of liver metastasis. This was not well seen on study from 11/26/2014. 2. Biliary dilatation and pneumobilia. The degree of intrahepatic bile duct dilatation is not significantly changed. There has been mild increase caliber of the common bile duct. 3. New splenomegaly and ascites. Findings may reflect changes of portal venous hypertension secondary to patient's worsening liver function. 4. Increase in volume of bilateral pleural effusions.   Electronically Signed   By: Kerby Moors M.D.   On: 12/14/2014 17:02   Dg Chest Port 1 View  12/14/2014   CLINICAL DATA:  Shortness of breath.  EXAM: PORTABLE CHEST - 1 VIEW  COMPARISON:  July 19, 2014.  FINDINGS: Stable cardiomediastinal silhouette. No pneumothorax or pleural effusion is noted. Stable right internal jugular Port-A-Cath is noted with distal tip overlying expected position of SVC. Increased central pulmonary vascular congestion is noted as well as bilateral interstitial densities concerning for pulmonary edema. Bony thorax is intact.  IMPRESSION: Increased central pulmonary vascular congestion is noted with probable bilateral pulmonary edema.   Electronically Signed   By: Marijo Conception, M.D.   On: 12/14/2014 14:38    Scheduled Meds: . enoxaparin (LOVENOX) injection  75 mg Subcutaneous Q12H  . hydrocortisone sod succinate (SOLU-CORTEF) inj  100 mg Intravenous 3 times per day  . insulin aspart  0-9 Units Subcutaneous TID WC  .  piperacillin-tazobactam (ZOSYN)  IV  3.375 g Intravenous 3 times per day  . sodium chloride  3 mL Intravenous Q12H  . vancomycin  750 mg Intravenous Q12H   Continuous Infusions: . sodium chloride 75 mL/hr at 12/15/14 0600    Active Problems:   Type 2 diabetes mellitus   Acute thromboembolism of deep veins of lower extremity   Liver metastasis   Pancreatic cancer   Acute encephalopathy   Fever   Pressure ulcer stage II   SIRS (systemic inflammatory response syndrome)    Time spent: 25 minutes    [facility] Pager [phone]. If 7PM-7AM, please contact night-coverage at [email or web address], password [facility] 12/15/2014, 2:39 PM  LOS: 1 day

## 2014-12-15 NOTE — Progress Notes (Signed)
Orders for EKG were placed for bradycardia. Will follow up with [facility] NP on call with results. Luther Parody, RN

## 2014-12-16 DIAGNOSIS — C787 Secondary malignant neoplasm of liver and intrahepatic bile duct: Principal | ICD-10-CM

## 2014-12-16 DIAGNOSIS — C801 Malignant (primary) neoplasm, unspecified: Secondary | ICD-10-CM

## 2014-12-16 LAB — COMPREHENSIVE METABOLIC PANEL
ALBUMIN: 2.2 g/dL — AB (ref 3.5–5.0)
ALT: 44 U/L (ref 17–63)
AST: 57 U/L — ABNORMAL HIGH (ref 15–41)
Alkaline Phosphatase: 342 U/L — ABNORMAL HIGH (ref 38–126)
Anion gap: 7 (ref 5–15)
BUN: 22 mg/dL — ABNORMAL HIGH (ref 6–20)
CO2: 26 mmol/L (ref 22–32)
Calcium: 8 mg/dL — ABNORMAL LOW (ref 8.9–10.3)
Chloride: 104 mmol/L (ref 101–111)
Creatinine, Ser: 0.99 mg/dL (ref 0.61–1.24)
Glucose, Bld: 273 mg/dL — ABNORMAL HIGH (ref 65–99)
Potassium: 4.3 mmol/L (ref 3.5–5.1)
Sodium: 137 mmol/L (ref 135–145)
Total Bilirubin: 0.9 mg/dL (ref 0.3–1.2)
Total Protein: 5.4 g/dL — ABNORMAL LOW (ref 6.5–8.1)

## 2014-12-16 LAB — CBC
HCT: 25.8 % — ABNORMAL LOW (ref 39.0–52.0)
Hemoglobin: 8.4 g/dL — ABNORMAL LOW (ref 13.0–17.0)
MCH: 35 pg — ABNORMAL HIGH (ref 26.0–34.0)
MCHC: 32.6 g/dL (ref 30.0–36.0)
MCV: 107.5 fL — ABNORMAL HIGH (ref 78.0–100.0)
Platelets: 253 10*3/uL (ref 150–400)
RBC: 2.4 MIL/uL — ABNORMAL LOW (ref 4.22–5.81)
RDW: 20.1 % — AB (ref 11.5–15.5)
WBC: 7.2 10*3/uL (ref 4.0–10.5)

## 2014-12-16 LAB — GLUCOSE, CAPILLARY
GLUCOSE-CAPILLARY: 216 mg/dL — AB (ref 65–99)
GLUCOSE-CAPILLARY: 220 mg/dL — AB (ref 65–99)
Glucose-Capillary: 237 mg/dL — ABNORMAL HIGH (ref 65–99)
Glucose-Capillary: 222 mg/dL — ABNORMAL HIGH (ref 65–99)

## 2014-12-16 MED ORDER — FENTANYL 12 MCG/HR TD PT72
12.5000 ug | MEDICATED_PATCH | TRANSDERMAL | Status: DC
  Administered 2014-12-16: 12.5 ug via TRANSDERMAL
  Filled 2014-12-16: qty 1

## 2014-12-16 NOTE — Progress Notes (Signed)
[facility] PROGRESS NOTE  [name] [name] GGE:[id number] DOB: 07/08/1923 DOA: 12/14/2014 PCP: Drema Pry, DO  Assessment/Plan: 1. SIRS: ? Sepsis. Initially admitted to step down. Started on broad spectrum antibiotics and fluid resuscitation. His fever has resolved and his bp improved. Blood cultures are negative so far. Would resume antibiotics for 48 hours more and transition to oral antibiotics.  Taper stress dose steroids as deemed appropriate.  2. Elevated liver function tests: improving.  3. Hypoxia: plan to wean him off oxygen in the 24 hours. No pneumonia found on CXR.  4. Metastatic Pancreatic Cancer: xeloda being held for now, in view of fever on admission and SIRS.  5. Acute encephalopathy: unclear etiology. Resolved. Started regular diet.  6. Diabetes Mellitus: CBG (last 3)   Recent Labs  12/15/14 1640 12/15/14 2120 12/16/14 0809  GLUCAP 209* 240* 216*       Code Status: DNR Family Communication : none at bedside Disposition Plan: pending.    Consultants: Curb side with Dr Oletta Darter PCCM  Procedures:  CT ABDOMEN AND PELVIS.   Antibiotics:  Vancomycin  Zosyn 5/27  HPI/Subjective: He is alert and conversing, none at bedside. Patient reports some abdominal pain on the right side. No nausea or vomiting.    Objective: Filed Vitals:   12/16/14 0428  BP: 118/58  Pulse: 64  Temp: 97.6 F (36.4 C)  Resp: 18    Intake/Output Summary (Last 24 hours) at 12/16/14 1159 Last data filed at 12/16/14 0900  Gross per 24 hour  Intake   1920 ml  Output    825 ml  Net   1095 ml   Filed Weights   12/14/14 2000  Weight: 78.9 kg (173 lb 15.1 oz)    Exam:   General:  Alert afebrile comfortable  Cardiovascular: s1s2  Respiratory: ctab, no wheezing heard.   Abdomen: soft non tender non distended bowel sounds heard.   Musculoskeletal: 1+ edema.   Data Reviewed: Basic Metabolic Panel:  Recent Labs Lab 12/14/14 1429 12/15/14 0335 12/16/14 0425  NA  138 136 137  K 4.4 4.3 4.3  CL 100* 103 104  CO2 31 27 26   GLUCOSE 148* 148* 273*  BUN 17 14 22*  CREATININE 0.72 0.73 0.99  CALCIUM 8.6* 8.0* 8.0*   Liver Function Tests:  Recent Labs Lab 12/14/14 1429 12/15/14 0335 12/16/14 0425  AST 357* 171* 57*  ALT 79* 79* 44  ALKPHOS 673* 510* 342*  BILITOT 2.5* 4.4* 0.9  PROT 6.3* 6.0* 5.4*  ALBUMIN 2.9* 2.6* 2.2*   No results for input(s): LIPASE, AMYLASE in the last 168 hours.  Recent Labs Lab 12/14/14 1820  AMMONIA 16   CBC:  Recent Labs Lab 12/14/14 1429 12/15/14 0335 12/16/14 0425  WBC 2.5* 7.1 7.2  HGB 17.9* 8.4* 8.4*  HCT 54.4* 25.9* 25.8*  MCV 106.0* 107.0* 107.5*  PLT 88* 212 253   Cardiac Enzymes: No results for input(s): CKTOTAL, CKMB, CKMBINDEX, TROPONINI in the last 168 hours. BNP (last 3 results)  Recent Labs  12/14/14 1429  BNP 483.1*    ProBNP (last 3 results) No results for input(s): PROBNP in the last 8760 hours.  CBG:  Recent Labs Lab 12/15/14 0824 12/15/14 1211 12/15/14 1640 12/15/14 2120 12/16/14 0809  GLUCAP 155* 200* 209* 240* 216*    Recent Results (from the past 240 hour(s))  Blood culture (routine x 2)     Status: None (Preliminary result)   Collection Time: 12/14/14  2:20 PM  Result Value Ref Range  Status   Specimen Description BLOOD RFARM  Final   Special Requests BOTTLES DRAWN AEROBIC AND ANAEROBIC 5CC  Final   Culture   Final           BLOOD CULTURE RECEIVED NO GROWTH TO DATE CULTURE WILL BE HELD FOR 5 DAYS BEFORE ISSUING A FINAL NEGATIVE REPORT Performed at [facility]    Report Status PENDING  Incomplete  Urine culture     Status: None   Collection Time: 10-Jan-2015  3:18 PM  Result Value Ref Range Status   Specimen Description URINE, CATHETERIZED  Final   Special Requests Immunocompromised  Final   Colony Count NO GROWTH Performed at [facility]   Final   Culture NO GROWTH Performed at [facility]   Final   Report Status  12/15/2014 FINAL  Final  Blood culture (routine x 2)     Status: None (Preliminary result)   Collection Time: 2015-01-10  3:40 PM  Result Value Ref Range Status   Specimen Description BLOOD RAC  Final   Special Requests BOTTLES DRAWN AEROBIC AND ANAEROBIC 5CC  Final   Culture   Final           BLOOD CULTURE RECEIVED NO GROWTH TO DATE CULTURE WILL BE HELD FOR 5 DAYS BEFORE ISSUING A FINAL NEGATIVE REPORT Performed at [facility]    Report Status PENDING  Incomplete  MRSA PCR Screening     Status: None   Collection Time: 2015/01/10  7:10 PM  Result Value Ref Range Status   MRSA by PCR NEGATIVE NEGATIVE Final    Comment:        The GeneXpert MRSA Assay (FDA approved for NASAL specimens only), is one component of a comprehensive MRSA colonization surveillance program. It is not intended to diagnose MRSA infection nor to guide or monitor treatment for MRSA infections.      Studies: Dg Wrist Complete Left  01/10/15   CLINICAL DATA:  Altered mental status, 2 falls in the last 2 weeks, left wrist swelling  EXAM: LEFT WRIST - COMPLETE 3+ VIEW  COMPARISON:  None.  FINDINGS: Four views of the left wrist submitted. No acute fracture or subluxation. There is diffuse osteopenia. Narrowing of radiocarpal joint space. Chondrocalcinosis is noted. Degenerative changes first carpometacarpal joint. Degenerative changes anterior aspect of navicular.  IMPRESSION: No acute fracture or subluxation. Chondrocalcinosis. Degenerative changes as described above. Diffuse osteopenia.   Electronically Signed   By: Lahoma Crocker M.D.   On: 01/10/15 16:32   Ct Head Wo Contrast  January 10, 2015   CLINICAL DATA:  Confusion, fever abnormal liver function. History of pancreatic carcinoma.  EXAM: CT HEAD WITHOUT CONTRAST  TECHNIQUE: Contiguous axial images were obtained from the base of the skull through the vertex without intravenous contrast.  COMPARISON:  12/30/2013  FINDINGS: Stable cortical atrophy and  periventricular white matter small vessel ischemic changes. The brain demonstrates no evidence of hemorrhage, infarction, edema, mass effect, extra-axial fluid collection, hydrocephalus or mass lesion. The skull is unremarkable.  IMPRESSION: No acute findings.  Stable atrophy and small vessel disease.   Electronically Signed   By: Aletta Edouard M.D.   On: 01/10/2015 16:53   Ct Abdomen Pelvis W Contrast  Jan 10, 2015   CLINICAL DATA:  History of pancreas cancer with worsening liver function  EXAM: CT ABDOMEN AND PELVIS WITH CONTRAST  TECHNIQUE: Multidetector CT imaging of the abdomen and pelvis was performed using the standard protocol following bolus administration of intravenous contrast.  CONTRAST:  171mL OMNIPAQUE IOHEXOL 300 MG/ML  SOLN  COMPARISON:  11/26/2014  FINDINGS: Lower chest: Small bilateral pleural effusions are increased in volume from previous exam. There is overlying compressive type atelectasis and consolidation.  Hepatobiliary: Moderate intrahepatic biliary ductal dilatation with pneumobilia is again identified and appears similar to the previous exam. Stable low-attenuation lesions in segment 3 and segment 6 of the liver compatible with small cysts. At this time there there is a subtle, low-attenuation structure within segment 7 of the liver which measures 1.2 cm, image 7/series 2. This is similar to previous examination in may represent an area of metastatic disease. Other previously demonstrated areas of liver metastasis are not well seen on the current exam. The common bile duct stent appears similarly pleasant shins than on the previous exam. On the current exam the common bile duct measures 2.8 cm, image 40/ series 4. Previously 2.1 cm  Pancreas: No discrete pancreas mass identified. Fatty replacement of the pancreatic parenchyma is identified.  Spleen: The spleen measures 14 cm in length, image 57 of series 4. Previously 11 cm. No focal splenic abnormality noted.  Adrenals/Urinary Tract:  The adrenal glands are both normal. Unremarkable appearance of the kidneys.  Stomach/Bowel: The stomach is normal. The small bowel loops have a normal course and caliber. No evidence for obstruction. Normal appearance of the colon stress set normal appearance of the proximal colon. There are multiple sigmoid diverticula without acute inflammation.  Vascular/Lymphatic: Calcified atherosclerotic disease involves the abdominal aorta. No aneurysm. No enlarged retroperitoneal or mesenteric adenopathy. No enlarged pelvic or inguinal lymph nodes.  Reproductive: Brachytherapy implants throughout the prostate gland noted. Seminal vesicles are atrophic.  Other: Small amount of ascites is identified within the abdomen and pelvis. New from previous exam.  Musculoskeletal: Scattered well defined sclerotic foci are identified within the osseous structures and are favored to represent small bone islands. No aggressive lytic or sclerotic bone lesions identified.  IMPRESSION: 1. With respect to the patient's pancreatic malignancy findings on today's are exam are not significantly changed compared with previous study. There is a lesion within the dome of liver which is unchanged from 09/17/2014 in may represent an area of liver metastasis. This was not well seen on study from 11/26/2014. 2. Biliary dilatation and pneumobilia. The degree of intrahepatic bile duct dilatation is not significantly changed. There has been mild increase caliber of the common bile duct. 3. New splenomegaly and ascites. Findings may reflect changes of portal venous hypertension secondary to patient's worsening liver function. 4. Increase in volume of bilateral pleural effusions.   Electronically Signed   By: Kerby Moors M.D.   On: 12/14/2014 17:02   Dg Chest Port 1 View  12/14/2014   CLINICAL DATA:  Shortness of breath.  EXAM: PORTABLE CHEST - 1 VIEW  COMPARISON:  July 19, 2014.  FINDINGS: Stable cardiomediastinal silhouette. No pneumothorax or  pleural effusion is noted. Stable right internal jugular Port-A-Cath is noted with distal tip overlying expected position of SVC. Increased central pulmonary vascular congestion is noted as well as bilateral interstitial densities concerning for pulmonary edema. Bony thorax is intact.  IMPRESSION: Increased central pulmonary vascular congestion is noted with probable bilateral pulmonary edema.   Electronically Signed   By: Marijo Conception, M.D.   On: 12/14/2014 14:38    Scheduled Meds: . apixaban  5 mg Oral BID  . famotidine  20 mg Oral Daily  . fentaNYL  12.5 mcg Transdermal Q72H  . hydrocortisone sod succinate (SOLU-CORTEF) inj  50  mg Intravenous 3 times per day  . insulin aspart  0-9 Units Subcutaneous TID WC  . piperacillin-tazobactam (ZOSYN)  IV  3.375 g Intravenous 3 times per day  . polyethylene glycol  17 g Oral BID  . pregabalin  75 mg Oral Daily  . sodium chloride  3 mL Intravenous Q12H  . vancomycin  750 mg Intravenous Q12H   Continuous Infusions: . sodium chloride 75 mL/hr at 12/15/14 2257    Active Problems:   Type 2 diabetes mellitus   Acute thromboembolism of deep veins of lower extremity   Liver metastasis   Pancreatic cancer   Acute encephalopathy   Fever   Pressure ulcer stage II   SIRS (systemic inflammatory response syndrome)    Time spent: 25 minutes    [facility] Pager [phone]. If 7PM-7AM, please contact night-coverage at [email or web address], password [facility] 12/16/2014, 11:59 AM  LOS: 2 days

## 2014-12-17 DIAGNOSIS — R945 Abnormal results of liver function studies: Secondary | ICD-10-CM

## 2014-12-17 DIAGNOSIS — D649 Anemia, unspecified: Secondary | ICD-10-CM

## 2014-12-17 LAB — HEMOGLOBIN AND HEMATOCRIT, BLOOD
HCT: 31.3 % — ABNORMAL LOW (ref 39.0–52.0)
HEMOGLOBIN: 10.1 g/dL — AB (ref 13.0–17.0)

## 2014-12-17 LAB — GLUCOSE, CAPILLARY
Glucose-Capillary: 170 mg/dL — ABNORMAL HIGH (ref 65–99)
Glucose-Capillary: 143 mg/dL — ABNORMAL HIGH (ref 65–99)
Glucose-Capillary: 166 mg/dL — ABNORMAL HIGH (ref 65–99)
Glucose-Capillary: 166 mg/dL — ABNORMAL HIGH (ref 65–99)

## 2014-12-17 LAB — PREPARE RBC (CROSSMATCH)

## 2014-12-17 MED ORDER — SODIUM CHLORIDE 0.9 % IV SOLN
Freq: Once | INTRAVENOUS | Status: AC
  Administered 2014-12-17: 12:00:00 via INTRAVENOUS

## 2014-12-17 MED ORDER — FUROSEMIDE 10 MG/ML IJ SOLN
10.0000 mg | Freq: Once | INTRAMUSCULAR | Status: AC
  Administered 2014-12-17: 10 mg via INTRAVENOUS
  Filled 2014-12-17: qty 2

## 2014-12-17 NOTE — Progress Notes (Signed)
[facility] PROGRESS NOTE  [name] [name] QBH:[id number] DOB: [birth year] DOA: 12/14/2014 PCP: Drema Pry, DO  Assessment/Plan: 1. SIRS: ? Sepsis. Initially admitted to step down. Started on broad spectrum antibiotics and fluid resuscitation. His fever has resolved and his bp improved. Blood cultures are negative so far. Plan to d/c antibiotics in am if no source found. Tapered stress dose steroids as deemed appropriate.  2. Elevated liver function tests: improving.  3. Hypoxia: plan to wean him off oxygen in the 24 hours. No pneumonia found on CXR.  4. Metastatic Pancreatic Cancer: xeloda being held for now, in view of fever on admission and SIRS.  5. Acute encephalopathy: unclear etiology. Resolved. Started regular diet.  6. Diabetes Mellitus: CBG (last 3)   Recent Labs  12/16/14 2137 12/17/14 0728 12/17/14 1136  GLUCAP 222* 170* 143*       Code Status: DNR Family Communication : none at bedside Disposition Plan: pending.    Consultants: Curb side with Dr Oletta Darter PCCM  Procedures:  CT ABDOMEN AND PELVIS.   Antibiotics:  Vancomycin  Zosyn 5/27  HPI/Subjective: He is alert and conversing, none at bedside. Patient reports some abdominal pain on the right side. No nausea or vomiting.    Objective: Filed Vitals:   12/17/14 1150  BP: 124/62  Pulse: 53  Temp: 97.6 F (36.4 C)  Resp: 16    Intake/Output Summary (Last 24 hours) at 12/17/14 1314 Last data filed at 12/17/14 1216  Gross per 24 hour  Intake 3307.5 ml  Output    901 ml  Net 2406.5 ml   Filed Weights   12/14/14 2000  Weight: 78.9 kg (173 lb 15.1 oz)    Exam:   General:  Alert afebrile comfortable  Cardiovascular: s1s2  Respiratory: ctab, no wheezing heard.   Abdomen: soft non tender non distended bowel sounds heard.   Musculoskeletal: 1+ edema.   Data Reviewed: Basic Metabolic Panel:  Recent Labs Lab 12/14/14 1429 12/15/14 0335 12/16/14 0425  NA 138 136 137  K 4.4 4.3 4.3   CL 100* 103 104  CO2 31 27 26   GLUCOSE 148* 148* 273*  BUN 17 14 22*  CREATININE 0.72 0.73 0.99  CALCIUM 8.6* 8.0* 8.0*   Liver Function Tests:  Recent Labs Lab 12/14/14 1429 12/15/14 0335 12/16/14 0425  AST 357* 171* 57*  ALT 79* 79* 44  ALKPHOS 673* 510* 342*  BILITOT 2.5* 4.4* 0.9  PROT 6.3* 6.0* 5.4*  ALBUMIN 2.9* 2.6* 2.2*   No results for input(s): LIPASE, AMYLASE in the last 168 hours.  Recent Labs Lab 12/14/14 1820  AMMONIA 16   CBC:  Recent Labs Lab 12/14/14 1429 12/15/14 0335 12/16/14 0425  WBC 2.5* 7.1 7.2  HGB 17.9* 8.4* 8.4*  HCT 54.4* 25.9* 25.8*  MCV 106.0* 107.0* 107.5*  PLT 88* 212 253   Cardiac Enzymes: No results for input(s): CKTOTAL, CKMB, CKMBINDEX, TROPONINI in the last 168 hours. BNP (last 3 results)  Recent Labs  12/14/14 1429  BNP 483.1*    ProBNP (last 3 results) No results for input(s): PROBNP in the last 8760 hours.  CBG:  Recent Labs Lab 12/16/14 1201 12/16/14 1708 12/16/14 2137 12/17/14 0728 12/17/14 1136  GLUCAP 220* 237* 222* 170* 143*    Recent Results (from the past 240 hour(s))  Blood culture (routine x 2)     Status: None (Preliminary result)   Collection Time: 12/14/14  2:20 PM  Result Value Ref Range Status   Specimen Description BLOOD RFARM  Final   Special Requests BOTTLES DRAWN AEROBIC AND ANAEROBIC 5CC  Final   Culture   Final           BLOOD CULTURE RECEIVED NO GROWTH TO DATE CULTURE WILL BE HELD FOR 5 DAYS BEFORE ISSUING A FINAL NEGATIVE REPORT Performed at [facility]    Report Status PENDING  Incomplete  Urine culture     Status: None   Collection Time: 12/14/14  3:18 PM  Result Value Ref Range Status   Specimen Description URINE, CATHETERIZED  Final   Special Requests Immunocompromised  Final   Colony Count NO GROWTH Performed at [facility]   Final   Culture NO GROWTH Performed at [facility]   Final   Report Status 12/15/2014 FINAL  Final  Blood  culture (routine x 2)     Status: None (Preliminary result)   Collection Time: 12/14/14  3:40 PM  Result Value Ref Range Status   Specimen Description BLOOD RAC  Final   Special Requests BOTTLES DRAWN AEROBIC AND ANAEROBIC 5CC  Final   Culture   Final           BLOOD CULTURE RECEIVED NO GROWTH TO DATE CULTURE WILL BE HELD FOR 5 DAYS BEFORE ISSUING A FINAL NEGATIVE REPORT Performed at [facility]    Report Status PENDING  Incomplete  MRSA PCR Screening     Status: None   Collection Time: 12/14/14  7:10 PM  Result Value Ref Range Status   MRSA by PCR NEGATIVE NEGATIVE Final    Comment:        The GeneXpert MRSA Assay (FDA approved for NASAL specimens only), is one component of a comprehensive MRSA colonization surveillance program. It is not intended to diagnose MRSA infection nor to guide or monitor treatment for MRSA infections.      Studies: No results found.  Scheduled Meds: . apixaban  5 mg Oral BID  . famotidine  20 mg Oral Daily  . fentaNYL  12.5 mcg Transdermal Q72H  . furosemide  10 mg Intravenous Once  . insulin aspart  0-9 Units Subcutaneous TID WC  . piperacillin-tazobactam (ZOSYN)  IV  3.375 g Intravenous 3 times per day  . polyethylene glycol  17 g Oral BID  . pregabalin  75 mg Oral Daily  . sodium chloride  3 mL Intravenous Q12H  . vancomycin  750 mg Intravenous Q12H   Continuous Infusions: . sodium chloride 75 mL/hr at 12/17/14 0357    Active Problems:   Type 2 diabetes mellitus   Acute thromboembolism of deep veins of lower extremity   Liver metastasis   Pancreatic cancer   Acute encephalopathy   Fever   Pressure ulcer stage II   SIRS (systemic inflammatory response syndrome)    Time spent: 25 minutes    [facility] Pager [phone]. If 7PM-7AM, please contact night-coverage at [email or web address], password [facility] 12/17/2014, 1:14 PM  LOS: 3 days

## 2014-12-17 NOTE — Care Management Note (Signed)
Case Management Note  Patient Details  Name: [name] [name] MRN: [id number] Date of Birth: [birth year]  Subjective/Objective:   81 y/o m admitted w/fever.From SNF.                 Action/Plan:d/c plan return SNF.   Expected Discharge Date:                Expected Discharge Plan:  Skilled Nursing Facility  In-House Referral:  Clinical Social Work  Discharge planning Services  CM Consult  Post Acute Care Choice:    Choice offered to:     DME Arranged:    DME Agency:     HH Arranged:    [place] Agency:     Status of Service:  In process, will continue to follow  Medicare Important Message Given:  Yes Date Medicare IM Given:  12/17/14 Medicare IM give by:  Dessa Phi Date Additional Medicare IM Given:    Additional Medicare Important Message give by:     If discussed at [facility] of Stay Meetings, dates discussed:    Additional Comments:  Dessa Phi, RN 12/17/2014, 12:43 PM

## 2014-12-17 NOTE — Progress Notes (Signed)
[facility] PROGRESS NOTE  [name] [name] [id number] DOB: [birth year] DOA: 12/14/2014 PCP: Drema Pry, DO  Assessment/Plan: 1. SIRS: ? Sepsis. Initially admitted to step down. Started on broad spectrum antibiotics and fluid resuscitation. His fever has resolved and his bp improved. Blood cultures are negative so far. Plan to d/c antibiotics in am if no source found. Tapered stress dose steroids as deemed appropriate.  2. Elevated liver function tests: improving.  3. Hypoxia: plan to wean him off oxygen in the 24 hours. No pneumonia found on CXR.  4. Metastatic Pancreatic Cancer: xeloda being held for now, in view of fever on admission and SIRS.  5. Acute encephalopathy: unclear etiology. Resolved. Started regular diet.  6. Diabetes Mellitus: CBG (last 3)   Recent Labs  12/16/14 2137 12/17/14 0728 12/17/14 1136  GLUCAP 222* 170* 143*   7. Anemia: - 2 units of prbc transfusion ordered. Repeat hemoglobin in am.     Code Status: DNR Family Communication : none at bedside Disposition Plan: pending.    Consultants: Curb side with Dr Oletta Darter PCCM  Procedures:  CT ABDOMEN AND PELVIS.   Antibiotics:  Vancomycin  Zosyn 5/27  HPI/Subjective: He is alert and conversing, none at bedside. Patient reports some abdominal pain on the right side. No nausea or vomiting.    Objective: Filed Vitals:   12/17/14 1150  BP: 124/62  Pulse: 53  Temp: 97.6 F (36.4 C)  Resp: 16    Intake/Output Summary (Last 24 hours) at 12/17/14 1317 Last data filed at 12/17/14 1216  Gross per 24 hour  Intake 3307.5 ml  Output    901 ml  Net 2406.5 ml   Filed Weights   12/14/14 2000  Weight: 78.9 kg (173 lb 15.1 oz)    Exam:   General:  Alert afebrile comfortable  Cardiovascular: s1s2  Respiratory: ctab, no wheezing heard.   Abdomen: soft non tender non distended bowel sounds heard.   Musculoskeletal: 1+ edema.   Data Reviewed: Basic Metabolic Panel:  Recent Labs Lab  12/14/14 1429 12/15/14 0335 12/16/14 0425  NA 138 136 137  K 4.4 4.3 4.3  CL 100* 103 104  CO2 31 27 26   GLUCOSE 148* 148* 273*  BUN 17 14 22*  CREATININE 0.72 0.73 0.99  CALCIUM 8.6* 8.0* 8.0*   Liver Function Tests:  Recent Labs Lab 12/14/14 1429 12/15/14 0335 12/16/14 0425  AST 357* 171* 57*  ALT 79* 79* 44  ALKPHOS 673* 510* 342*  BILITOT 2.5* 4.4* 0.9  PROT 6.3* 6.0* 5.4*  ALBUMIN 2.9* 2.6* 2.2*   No results for input(s): LIPASE, AMYLASE in the last 168 hours.  Recent Labs Lab 12/14/14 1820  AMMONIA 16   CBC:  Recent Labs Lab 12/14/14 1429 12/15/14 0335 12/16/14 0425  WBC 2.5* 7.1 7.2  HGB 17.9* 8.4* 8.4*  HCT 54.4* 25.9* 25.8*  MCV 106.0* 107.0* 107.5*  PLT 88* 212 253   Cardiac Enzymes: No results for input(s): CKTOTAL, CKMB, CKMBINDEX, TROPONINI in the last 168 hours. BNP (last 3 results)  Recent Labs  12/14/14 1429  BNP 483.1*    ProBNP (last 3 results) No results for input(s): PROBNP in the last 8760 hours.  CBG:  Recent Labs Lab 12/16/14 1201 12/16/14 1708 12/16/14 2137 12/17/14 0728 12/17/14 1136  GLUCAP 220* 237* 222* 170* 143*    Recent Results (from the past 240 hour(s))  Blood culture (routine x 2)     Status: None (Preliminary result)   Collection Time: 12/14/14  2:20  PM  Result Value Ref Range Status   Specimen Description BLOOD RFARM  Final   Special Requests BOTTLES DRAWN AEROBIC AND ANAEROBIC 5CC  Final   Culture   Final           BLOOD CULTURE RECEIVED NO GROWTH TO DATE CULTURE WILL BE HELD FOR 5 DAYS BEFORE ISSUING A FINAL NEGATIVE REPORT Performed at [facility]    Report Status PENDING  Incomplete  Urine culture     Status: None   Collection Time: 12/14/14  3:18 PM  Result Value Ref Range Status   Specimen Description URINE, CATHETERIZED  Final   Special Requests Immunocompromised  Final   Colony Count NO GROWTH Performed at [facility]   Final   Culture NO GROWTH Performed at  [facility]   Final   Report Status 12/15/2014 FINAL  Final  Blood culture (routine x 2)     Status: None (Preliminary result)   Collection Time: 12/14/14  3:40 PM  Result Value Ref Range Status   Specimen Description BLOOD RAC  Final   Special Requests BOTTLES DRAWN AEROBIC AND ANAEROBIC 5CC  Final   Culture   Final           BLOOD CULTURE RECEIVED NO GROWTH TO DATE CULTURE WILL BE HELD FOR 5 DAYS BEFORE ISSUING A FINAL NEGATIVE REPORT Performed at [facility]    Report Status PENDING  Incomplete  MRSA PCR Screening     Status: None   Collection Time: 12/14/14  7:10 PM  Result Value Ref Range Status   MRSA by PCR NEGATIVE NEGATIVE Final    Comment:        The GeneXpert MRSA Assay (FDA approved for NASAL specimens only), is one component of a comprehensive MRSA colonization surveillance program. It is not intended to diagnose MRSA infection nor to guide or monitor treatment for MRSA infections.      Studies: No results found.  Scheduled Meds: . apixaban  5 mg Oral BID  . famotidine  20 mg Oral Daily  . fentaNYL  12.5 mcg Transdermal Q72H  . furosemide  10 mg Intravenous Once  . insulin aspart  0-9 Units Subcutaneous TID WC  . piperacillin-tazobactam (ZOSYN)  IV  3.375 g Intravenous 3 times per day  . polyethylene glycol  17 g Oral BID  . pregabalin  75 mg Oral Daily  . sodium chloride  3 mL Intravenous Q12H  . vancomycin  750 mg Intravenous Q12H   Continuous Infusions: . sodium chloride 75 mL/hr at 12/17/14 0357    Active Problems:   Type 2 diabetes mellitus   Acute thromboembolism of deep veins of lower extremity   Liver metastasis   Pancreatic cancer   Acute encephalopathy   Fever   Pressure ulcer stage II   SIRS (systemic inflammatory response syndrome)    Time spent: 25 minutes    [facility] Pager [phone]. If 7PM-7AM, please contact night-coverage at [email or web address], password [facility] 12/17/2014, 1:17 PM  LOS: 3  days

## 2014-12-17 NOTE — Consult Note (Signed)
Referral MD  Reason for Referral: Metastatic pancreatic cancer   Chief Complaint  Patient presents with  . Altered Mental Status  . Shortness of Breath  : No history given  HPI: [name] [name] is well-known to me. He is an 81 year old gentleman with metastatic pancreatic cancer. He actually has done quite well. He is on second line therapy with Xeloda. His last CA 19-9 in May 9 was stable at 31.6.  He is at a nursing home.  He was admitted on the 27th with some confusion and fever. He had elevated liver function tests area and I think he has a biliary stent in. This probably needs to be changed while he is in the hospital.  Cultures have been negative.  When he came in, his bilirubin was 4.4. Now it's 0.9.  He had a CT scan done. This showed essentially stable liver metastases. He had some increase in spleen size. He has some ascites. The radiologist thought that he may have some portal hypertension.  He is somewhat anemic. His hemoglobin is 8.4.  He is hard of hearing.  He's had no nausea vomiting as far as I can tell. He's had no pain issues.  He has been ambulating at the nursing home. He gets around with a walker.   Past Medical History  Diagnosis Date  . Diverticulosis of colon (without mention of hemorrhage)   . Irritable bowel syndrome   . Personal history of malignant neoplasm of prostate     s/p  radioactive seed implants 2000  . Unspecified hereditary and idiopathic peripheral neuropathy   . Nonischemic cardiomyopathy cardiologist-  dr Aundra Dubin    EF 25%  per last echo 2012 and cardiologist note--  2007-  ef  40-45%;  2008- ef 35-40%;  2010- ef 30-35%;  2012- ef 25%  . Sinus bradycardia   . Hypertension   . Arthritis     hands  . Hyperlipidemia   . History of DVT of lower extremity     2002  ,   left leg  . History of adenomatous polyp of colon     2001  . History of basal cell carcinoma excision   . Anticoagulated on Coumadin   . Type 2 diabetes, diet  controlled   . GERD (gastroesophageal reflux disease)   . LBBB (left bundle branch block)   . Wears glasses   . Wears hearing aid     BILATERAL  . Unsteady gait   . DDD (degenerative disc disease)     cervical and lumbar  . DJD (degenerative joint disease)     hips  . Lower urinary tract symptoms (LUTS)   . 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 Coronary artery disease     per cath 2002  non-obstructive cad  . Chronic systolic heart failure   . H/O fracture of nose     12-30-2013-  pt fell at home open nose bone fx  . Abdominal pain 12/20/2013  . Abnormal MRI of abdomen 01/31/2014  . Acute encephalopathy 07/02/2014  . Acute thromboembolism of deep veins of lower extremity 05/03/2007    Qualifier: History of  By: Doy Mince LPN, Megan  43/15/40 Doppler LLE: DVT at left femoral and popliteal veins not completely occlusive. Will anticoagulate with Eliquis 2.59m bid.    . Anemia of chronic disease 02/20/2013  . CARDIOMYOPATHY 07/06/2007    Qualifier: Diagnosis of  By:  Lenna Gilford MD, [place] Constipation 04/04/2014  . Coronary atherosclerosis 07/06/2007    Qualifier: Diagnosis of  By: Lenna Gilford MD, [place] DISEASE 05/03/2007    Qualifier: Diagnosis of  By: Doy Mince LPN, Megan    . DVT (deep venous thrombosis)   . Edema 05/28/2014  . Failure to thrive in adult 03/30/2014  . Fall in elderly patient 12/30/2013  . Fever 07/20/2014  . Frailty 03/30/2014  . FUO (fever of unknown origin) 05/28/2014    05/28/14 CXR right lower lobe bronchopneumonia.    Marland Kitchen GERD 05/03/2007    Qualifier: Diagnosis of  By: Doy Mince LPN, Megan    . Hereditary and idiopathic peripheral neuropathy 07/06/2007    Qualifier: Diagnosis of  By: Lenna Gilford MD, Deborra Medina   . HYPERLIPIDEMIA 05/03/2007    Qualifier: Diagnosis of  By: Doy Mince LPN, Megan    . HYPERTENSION, BENIGN 06/23/2010    Qualifier: Diagnosis of  By: Olevia Perches, MD,  Glenetta Hew   . Insomnia 04/04/2014  . Liver metastasis 04/01/2014  . Malnutrition of moderate degree 03/30/2014    05/01/14 albumin 2.8 05/29/14 albumin 3.0   . Metastatic cancer 03/29/2014  . Normocytic anemia 03/30/2014    04/10/14 Hgb 9.3   . Pancreatic cancer 04/06/2014    06/06/14 Oncology: stage IV pancreatic cancer with Mets to liver: CA 19-9 400--194. CT showed more liver metastases. Xeloda 151m bid x 14days on and 7 days off. 3 cycles of tx and then repeat the scans.    . Pressure ulcer stage II 08/15/2014    08/14/14 L scrotum small PU-not infected-tx initiated.    . Protein-calorie malnutrition, severe 07/05/2014  . SPINAL STENOSIS, LUMBAR 07/06/2007    Qualifier: Diagnosis of  By: NLenna GilfordMD, SDeborra Medina  . Streptococcal bacteremia 07/23/2014  . Transaminitis 03/30/2014    04/10/14 Alkaline phosphatase 126(39-117) 05/01/14 AST 33, ALT 45, alk  Phos 75 05/29/14 AST 64, ALT 70 alk phos 209   . Type 2 diabetes mellitus 05/03/2007    Qualifier: Diagnosis of  By: RDoy MinceLPN, Megan  192/33/00Hgb A1c 7.1    . Weakness generalized 04/02/2014  :  Past Surgical History  Procedure Laterality Date  . Knee arthroscopy Left 1991  . Ercp with sphincterotomy and stone removal  05/ 1993  &  05-11-2006  . Total knee arthroplasty  10/05/2011    Procedure: TOTAL KNEE ARTHROPLASTY;  Surgeon: FGearlean Alf MD;  Location: [facility];  Service: Orthopedics;  Laterality: Right;  . Transurethral resection of bladder tumor N/A 09/30/2012    Procedure: TRANSURETHRAL RESECTION OF BLADDER TUMOR (TURBT);  Surgeon: SFranchot Gallo MD;  Location: [facility];  Service: Urology;  Laterality: N/A;  WITH MITOMYCIN INSTILLATION   . Eus N/A 01/04/2014    Procedure: ESOPHAGEAL ENDOSCOPIC ULTRASOUND (EUS) RADIAL;  Surgeon: DMilus Banister MD;  Location: [facility];  Service: Endoscopy;  Laterality: N/A;  . Radioactive prostate seed implants  01/ 2000  . Mohs surgery  03/ 2009    LEFT EAR BASAL CELL CARCINOMA  .  Tonsillectomy  AS CHILD  . Cholecystectomy open  1985  . Appendectomy  child  . Inguinal hernia repair Bilateral 1998    recurrent RIH  w/ repair 06-21-2006  . Total knee arthroplasty Left 02-21-2007    AND 04-21-2007  CLOSED MANIPULATION  . Cataract extraction w/ intraocular lens implant Right 2004  . Lumbar spine surgery  X2  . Cardiac catheterization  06-14-2001  dr  bruce brodie    non-obstructive CAD/  LM 30%,  pLAD 50%,  mLAD 30%,  RCA  &  CFX  irregularites ,  global hypokinesis,  ef 40%  . Cardiovascular stress test  02-07-2007   dr Darnell Level brodie    negative for ischemia,  low risk myoview perfusion study  . Transthoracic echocardiogram  01-28-2011    moderate LVH/  ef 25%/  diffuse hypokinesis/  mild LAE/  mild TR/  trivial AR & MR  . Cystoscopy w/ retrogrades Bilateral 03/09/2014    Procedure: CYSTOSCOPY WITH BILATERAL RETROGRADE PYELOGRAM BLADDER BX AND RENAL WASHINGS.;  Surgeon: Jorja Loa, MD;  Location: [facility];  Service: Urology;  Laterality: Bilateral;  . Eus N/A 04/05/2014    Procedure: UPPER ENDOSCOPIC ULTRASOUND (EUS) LINEAR;  Surgeon: [name], MD;  Location: [facility];  Service: Endoscopy;  Laterality: N/A;  radial linear  . Endoscopic retrograde cholangiopancreatography (ercp) with propofol N/A 04/12/2014    Procedure: ENDOSCOPIC RETROGRADE CHOLANGIOPANCREATOGRAPHY (ERCP) WITH PROPOFOL;  Surgeon: [name], MD;  Location: [facility];  Service: Endoscopy;  Laterality: N/A;  metal stent  . Biliary stent placement N/A 04/12/2014    Procedure: BILIARY STENT PLACEMENT;  Surgeon: [name], MD;  Location: [facility];  Service: Endoscopy;  Laterality: N/A;  :   Current facility-administered medications:  .  0.9 %  sodium chloride infusion, , Intravenous, Continuous, Hosie Poisson, MD, Last Rate: 75 mL/hr at 12/17/14 0357 .  apixaban (ELIQUIS) tablet 5 mg, 5 mg, Oral, BID, Hosie Poisson, MD, 5 mg at 12/16/14 2016 .  famotidine  (PEPCID) tablet 20 mg, 20 mg, Oral, Daily, Hosie Poisson, MD, 20 mg at 12/16/14 1044 .  feeding supplement (GLUCERNA SHAKE) (GLUCERNA SHAKE) liquid 237 mL, 237 mL, Oral, BID BM PRN, Hosie Poisson, MD .  fentaNYL ([place] - dosed mcg/hr) 12.5 mcg, 12.5 mcg, Transdermal, Q72H, Hosie Poisson, MD, 12.5 mcg at 12/16/14 1212 .  insulin aspart (novoLOG) injection 0-9 Units, 0-9 Units, Subcutaneous, TID WC, Hosie Poisson, MD, 3 Units at 12/16/14 1713 .  lactulose (CHRONULAC) 10 GM/15ML solution 20 g, 20 g, Oral, Daily PRN, Hosie Poisson, MD .  morphine 2 MG/ML injection 1 mg, 1 mg, Intravenous, Q4H PRN, Hosie Poisson, MD, 1 mg at 12/17/14 0047 .  morphine CONCENTRATE 10 MG/0.5ML oral solution 5 mg, 5 mg, Oral, Q2H PRN, Hosie Poisson, MD, 5 mg at 12/16/14 1254 .  ondansetron (ZOFRAN) tablet 4 mg, 4 mg, Oral, Q6H PRN **OR** ondansetron (ZOFRAN) injection 4 mg, 4 mg, Intravenous, Q6H PRN, Hosie Poisson, MD .  oxyCODONE (Oxy IR/ROXICODONE) immediate release tablet 5 mg, 5 mg, Oral, Q6H PRN, Hosie Poisson, MD, 5 mg at 12/16/14 2017 .  piperacillin-tazobactam (ZOSYN) IVPB 3.375 g, 3.375 g, Intravenous, 3 times per day, Hosie Poisson, MD, 3.375 g at 12/17/14 0656 .  polyethylene glycol (MIRALAX / GLYCOLAX) packet 17 g, 17 g, Oral, BID, Hosie Poisson, MD, 17 g at 12/16/14 2016 .  polyvinyl alcohol (LIQUIFILM TEARS) 1.4 % ophthalmic solution 1 drop, 1 drop, Both Eyes, QID PRN, Hosie Poisson, MD, 1 drop at 12/16/14 0839 .  pregabalin (LYRICA) capsule 75 mg, 75 mg, Oral, Daily, Hosie Poisson, MD, 75 mg at 12/16/14 1044 .  sodium chloride 0.9 % injection 3 mL, 3 mL, Intravenous, Q12H, Hosie Poisson, MD, 3 mL at 12/16/14 2200 .  temazepam (RESTORIL) capsule 15 mg, 15 mg, Oral, QHS PRN, Ritta Slot, NP, 15 mg at 12/16/14 2017 .  vancomycin (VANCOCIN) IVPB  750 mg/150 ml premix, 750 mg, Intravenous, Q12H, Anh P Pham, RPH, 750 mg at 12/17/14 0515:  . apixaban  5 mg Oral BID  . famotidine  20 mg Oral Daily  . fentaNYL  12.5 mcg  Transdermal Q72H  . insulin aspart  0-9 Units Subcutaneous TID WC  . piperacillin-tazobactam (ZOSYN)  IV  3.375 g Intravenous 3 times per day  . polyethylene glycol  17 g Oral BID  . pregabalin  75 mg Oral Daily  . sodium chloride  3 mL Intravenous Q12H  . vancomycin  750 mg Intravenous Q12H  :  No Known Allergies:  Family History  Problem Relation Age of Onset  . Throat cancer Brother   . Coronary artery disease Father     deceased  :  History   Social History  . Marital Status: Widowed    Spouse Name: N/A  . Number of Children: 0  . Years of Education: N/A   Occupational History  .     Social History Main Topics  . Smoking status: Former Smoker -- 1.00 packs/day for 10 years    Types: Cigarettes    Start date: 04/10/1954    Quit date: 09/10/1982  . Smokeless tobacco: Never Used     Comment: quit 32 years ago  . Alcohol Use: No  . Drug Use: No  . Sexual Activity: Not on file   Other Topics Concern  . Not on file   Social History Narrative   Married, no children.   :  Pertinent items are noted in HPI.  Exam: Patient Vitals for the past 24 hrs:  BP Temp Temp src Pulse Resp SpO2  12/17/14 0529 (!) 121/56 mmHg 97.5 F (36.4 C) Axillary (!) 51 16 100 %  12/16/14 2134 (!) 121/59 mmHg 98.7 F (37.1 C) Oral (!) 59 16 100 %  12/16/14 1319 (!) 119/54 mmHg 98.1 F (36.7 C) Oral (!) 58 16 100 %    elderly white gentleman in no obvious distress. Vital signs show a temperature of 97.5. Pulse 51. Blood pressure 121/56. Head and neck exam shows no ocular or oral lesions. He has no scleral icterus. He has no adenopathy in the neck. Lungs are clear. Cardiac exam regular rate and rhythm with no murmurs, rubs or bruits. Abdomen is soft. He has no obvious fluid wave. There is no palpable liver or spleen tip. No palpable abdominal masses noted. Extremities shows some trace edema in his legs. He has good range of motion of his extremities. Skin exam shows some scattered  ecchymoses. Neurological exam is nonfocal.    Recent Labs  12/15/14 0335 12/16/14 0425  WBC 7.1 7.2  HGB 8.4* 8.4*  HCT 25.9* 25.8*  PLT 212 253    Recent Labs  12/15/14 0335 12/16/14 0425  NA 136 137  K 4.3 4.3  CL 103 104  CO2 27 26  GLUCOSE 148* 273*  BUN 14 22*  CREATININE 0.73 0.99  CALCIUM 8.0* 8.0*    Blood smear review: None  Pathology: None     Assessment and Plan: [name] [name] is a 81 year old gentleman with metastatic pancreatic cancer. For his age, he is done incredibly well. He is on second line therapy with Xeloda.  I have to say that he looks like his disease is pretty stable by the CT scan.  I am not sure as to why his bilirubin and LFTs were also elevated when he came in and now have normalized.  He is quite anemic. For  him, this is quite low. I really think he would benefit from 2 units of blood.  I left a message for his god- daughter, [name], that I saw him and will be involved with his care while in the hospital.  I appreciate the great care he is getting from everybody on 4 E!!!!  Pete E.  Romans 5:3-5

## 2014-12-17 NOTE — Clinical Social Work Note (Signed)
Clinical Social Work Assessment  Patient Details  Name: [name] [name] MRN: [id number] Date of Birth: 10-14-1922  Date of referral:  12/17/14               Reason for consult:  Facility Placement                Permission sought to share information with:  Facility Art therapist granted to share information::  Yes, Verbal Permission Granted  Name::        Agency::     Relationship::     Contact Information:     Housing/Transportation Living arrangements for the past 2 months:  [place] of Information:  Friend/Neighbor Patient Interpreter Needed:  None Criminal Activity/Legal Involvement Pertinent to Current Situation/Hospitalization:  No - Comment as needed Significant Relationships:  Friend Lives with:  Facility Resident Do you feel safe going back to the place where you live?  Yes Need for family participation in patient care:  Yes (Comment)  Care giving concerns:  CSW received consult that patient was admitted from [facility].    Social Worker assessment / plan:  CSW confirmed with patient's emergency contact/god-daughter, [name] [name] (ph#: [phone]) that patient will be returning to [facility] when discharged.   Employment status:  Retired Forensic scientist:  [employer] PT Recommendations:  [place] / Referral to community resources:  [place]  Patient/Family's Response to care:  Patient's god-daughter expressed that she has been very pleased with [facility] and that the SNF has been calling her for updates which makes her feel good that they are involved and supportive. [name] [name] informed CSW that patient has no living relatives but that Mr. Deboy has been a good family friend for the past 56 years and she is his God Daughter.   Patient/Family's Understanding of and Emotional Response to Diagnosis, Current Treatment, and Prognosis:  [name] [name] is glad to hear that  patient will be getting blood today and kept overnight to make sure he's stable before returning to SNF.   Emotional Assessment Appearance:  Appears stated age Attitude/Demeanor/Rapport:    Affect (typically observed):  Calm Orientation:  Oriented to Self Alcohol / Substance use:    Psych involvement (Current and /or in the community):     Discharge Needs  Concerns to be addressed:    Readmission within the last 30 days:    Current discharge risk:    Barriers to Discharge:  Continued Medical Work up   Standley Brooking, LCSW 12/17/2014, 12:05 PM

## 2014-12-18 ENCOUNTER — Ambulatory Visit: Payer: Medicare Other | Admitting: Family

## 2014-12-18 ENCOUNTER — Other Ambulatory Visit: Payer: Medicare Other

## 2014-12-18 ENCOUNTER — Inpatient Hospital Stay (HOSPITAL_COMMUNITY): Payer: Medicare Other

## 2014-12-18 DIAGNOSIS — Z86718 Personal history of other venous thrombosis and embolism: Secondary | ICD-10-CM

## 2014-12-18 LAB — TYPE AND SCREEN
ABO/RH(D): A POS
Antibody Screen: NEGATIVE
Unit division: 0
Unit division: 0

## 2014-12-18 LAB — GLUCOSE, CAPILLARY
GLUCOSE-CAPILLARY: 128 mg/dL — AB (ref 65–99)
GLUCOSE-CAPILLARY: 175 mg/dL — AB (ref 65–99)

## 2014-12-18 MED ORDER — BIOTENE DRY MOUTH MT LIQD
15.0000 mL | Freq: Four times a day (QID) | OROMUCOSAL | Status: DC
  Administered 2014-12-18 (×2): 15 mL via OROMUCOSAL

## 2014-12-18 NOTE — Care Management Note (Signed)
Case Management Note  Patient Details  Name: [name] [name] MRN: [id number] Date of Birth: [birth year]  Subjective/Objective:                    Action/Plan:d/c back to snf today.   Expected Discharge Date:   (UTA)               Expected Discharge Plan:  [place]  In-House Referral:  Clinical Social Work  Discharge planning Services  CM Consult  Post Acute Care Choice:    Choice offered to:     DME Arranged:    DME Agency:     HH Arranged:    [place] Agency:     Status of Service:  Completed, signed off  Medicare Important Message Given:  Yes Date Medicare IM Given:  12/17/14 Medicare IM give by:  Dessa Phi Date Additional Medicare IM Given:    Additional Medicare Important Message give by:     If discussed at [place] of Stay Meetings, dates discussed:    Additional Comments:  Dessa Phi, RN 12/18/2014, 12:09 PM

## 2014-12-18 NOTE — Progress Notes (Signed)
Patient's God-daughter, Lyn to transport back to ALF today. Discharge packet given to RN, Shirlee Limerick.      Raynaldo Opitz, [facility] Clinical Social Worker cell #: [phone]

## 2014-12-18 NOTE — Progress Notes (Signed)
*  PRELIMINARY RESULTS* Vascular Ultrasound Left lower extremity venous duplex has been completed.  Preliminary findings: No acute DVT.    A chronic strand of thrombus is noted in the left proximal femoral vein.    Landry Mellow, RDMS, RVT  12/18/2014, 10:54 AM

## 2014-12-18 NOTE — Discharge Summary (Signed)
Physician Discharge Summary  CALE BETHARD TKW:[id number] DOB: [birth year] DOA: 12/14/2014  PCP: Drema Pry, DO  Admit date: 12/14/2014 Discharge date: 12/18/2014  Time spent: 30 minutes  Recommendations for Outpatient Follow-up:  1. Follow up with oncology as recommended.   Discharge Diagnoses:  Active Problems:   Type 2 diabetes mellitus   Acute thromboembolism of deep veins of lower extremity   Liver metastasis   Pancreatic cancer   Acute encephalopathy   Fever   Pressure ulcer stage II   SIRS (systemic inflammatory response syndrome)   Discharge Condition: improved   Diet recommendation: regular/ carb modified diet.   Filed Weights   12/14/14 2000  Weight: 78.9 kg (173 lb 15.1 oz)    History of present illness:  [name] [name] is a 81 y.o. male With h/o DM, hypertension, metastatic pancreatic cancer on xeloda follows with Dr Marin Olp presents to ED with fever, AMS,.   Hospital Course:  1. SIRS: . Initially admitted to step down. Started on broad spectrum antibiotics and fluid resuscitation. His fever has resolved and his bp improved. Blood cultures are negative so far. Discontinued antibiotics today. Tapered stress dose steroids as deemed appropriate.  2. Elevated liver function tests: improving.  3. Hypoxia:  Resolved.  No pneumonia found on CXR.  4. Metastatic Pancreatic Cancer: xeloda being held for now, in view of fever on admission and SIRS, it can be resumed as per oncology.  5. Acute encephalopathy: unclear etiology. Resolved. Started regular diet.  6. Diabetes Mellitus: SSI while inpatient, can go back on home dose of long acting.  7. Anemia: 2units of prbc transfusion ordered and repeat h&H i smuch better.   Procedures:  2 units of PRBC transfusion  Consultations:  oncology  Discharge Exam: Filed Vitals:   12/18/14 0502  BP: 131/62  Pulse: 55  Temp: 98.1 F (36.7 C)  Resp: 18    General: alert afebrile comfortable Cardiovascular:  s1s2 Respiratory: ctab  Discharge Instructions    Current Discharge Medication List    CONTINUE these medications which have NOT CHANGED   Details  acetaminophen (TYLENOL) 325 MG tablet Take by mouth every 6 (six) hours as needed for fever (For fever greater than 101. Give one dose).    apixaban (ELIQUIS) 5 MG TABS tablet Take 1 tablet (5 mg total) by mouth 2 (two) times daily.    bisacodyl (DULCOLAX) 10 MG suppository Place 1 suppository (10 mg total) rectally daily as needed for moderate constipation. Qty: 12 suppository, Refills: 0    capecitabine (XELODA) 500 MG tablet Take 3 tablets (1,500 mg total) by mouth 2 (two) times daily after a meal. TAKES 3 TABS (1500) MG BID  ///  ON 14 DAYS OFF 7 DAYS FOR 3 CYCLES.   STARTED 06/06/14    docusate sodium 100 MG CAPS Take 100 mg by mouth 2 (two) times daily. Qty: 60 capsule, Refills: 0    feeding supplement, GLUCERNA SHAKE, (GLUCERNA SHAKE) LIQD Take 237 mLs by mouth 2 (two) times daily between meals as needed (If pt with <50% meal completion). Refills: 0    HYDROcodone-acetaminophen (NORCO) 10-325 MG per tablet Take one tablet by mouth every 4 hours as needed for pain Qty: 180 tablet, Refills: 0    insulin glargine (LANTUS) 100 UNIT/ML injection Inject 3 Units into the skin at bedtime.    lactulose (CHRONULAC) 10 GM/15ML solution Take 20 g by mouth daily as needed for mild constipation (52ml as needed for constipation).     lidocaine-prilocaine (EMLA)  cream Apply 1 application topically 6 (six) times daily. Apply topically every 4 hours as needed for pain    LORazepam (ATIVAN) 0.5 MG tablet Take 2 tablets (1 mg total) by mouth every 6 (six) hours as needed (nausea/vomiting). Qty: 15 tablet, Refills: 0    mirtazapine (REMERON) 7.5 MG tablet Take 7.5 mg by mouth at bedtime.   Associated Diagnoses: Malignant neoplasm of tail of pancreas    morphine (ROXANOL) 20 MG/ML concentrated solution Take 5 mg by mouth every 2 (two) hours as  needed for severe pain (Give 0.53ml=5mg ).    Multiple Vitamin (MULTIVITAMIN WITH MINERALS) TABS tablet Take 1 tablet by mouth daily.    Multiple Vitamins-Minerals (PRESERVISION AREDS 2) CAPS Take 1 capsule by mouth 2 (two) times daily.    nitroGLYCERIN (NITROSTAT) 0.4 MG SL tablet Place 1 tablet (0.4 mg total) under the tongue every 5 (five) minutes as needed for chest pain. Qty: 90 tablet, Refills: 3   Associated Diagnoses: Chest pain    Omega-3 Fatty Acids (FISH OIL) 1000 MG CAPS Take 1 capsule by mouth.    ondansetron (ZOFRAN) 8 MG tablet Take by mouth 2 (two) times daily as needed for nausea or vomiting.     oxyCODONE (OXY IR/ROXICODONE) 5 MG immediate release tablet Take one tablet by mouth twice daily for pain Qty: 60 tablet, Refills: 0    polyethylene glycol (MIRALAX / GLYCOLAX) packet Take 17 g by mouth 2 (two) times daily.    polyvinyl alcohol (LIQUIFILM TEARS) 1.4 % ophthalmic solution Place 1 drop into both eyes 4 (four) times daily as needed for dry eyes.    pravastatin (PRAVACHOL) 40 MG tablet Take 40 mg by mouth every evening.     pregabalin (LYRICA) 75 MG capsule Take 75 mg by mouth daily.    PRESCRIPTION MEDICATION     prochlorperazine (COMPAZINE) 5 MG tablet Take 5 mg by mouth every 6 (six) hours as needed for nausea or vomiting.    ranitidine (ZANTAC) 150 MG tablet Take 150 mg by mouth 2 (two) times daily.    temazepam (RESTORIL) 15 MG capsule Take 15 mg by mouth at bedtime.       STOP taking these medications     carvedilol (COREG) 12.5 MG tablet        No Known Allergies    The results of significant diagnostics from this hospitalization (including imaging, microbiology, ancillary and laboratory) are listed below for reference.    Significant Diagnostic Studies: Dg Wrist Complete Left  01/03/15   CLINICAL DATA:  Altered mental status, 2 falls in the last 2 weeks, left wrist swelling  EXAM: LEFT WRIST - COMPLETE 3+ VIEW  COMPARISON:  None.   FINDINGS: Four views of the left wrist submitted. No acute fracture or subluxation. There is diffuse osteopenia. Narrowing of radiocarpal joint space. Chondrocalcinosis is noted. Degenerative changes first carpometacarpal joint. Degenerative changes anterior aspect of navicular.  IMPRESSION: No acute fracture or subluxation. Chondrocalcinosis. Degenerative changes as described above. Diffuse osteopenia.   Electronically Signed   By: Lahoma Crocker M.D.   On: Jan 03, 2015 16:32   Ct Head Wo Contrast  2015-01-03   CLINICAL DATA:  Confusion, fever abnormal liver function. History of pancreatic carcinoma.  EXAM: CT HEAD WITHOUT CONTRAST  TECHNIQUE: Contiguous axial images were obtained from the base of the skull through the vertex without intravenous contrast.  COMPARISON:  12/30/2013  FINDINGS: Stable cortical atrophy and periventricular white matter small vessel ischemic changes. The brain demonstrates no evidence of hemorrhage,  infarction, edema, mass effect, extra-axial fluid collection, hydrocephalus or mass lesion. The skull is unremarkable.  IMPRESSION: No acute findings.  Stable atrophy and small vessel disease.   Electronically Signed   By: Aletta Edouard M.D.   On: 12/14/2014 16:53   Ct Abdomen Pelvis W Contrast  12/14/2014   CLINICAL DATA:  History of pancreas cancer with worsening liver function  EXAM: CT ABDOMEN AND PELVIS WITH CONTRAST  TECHNIQUE: Multidetector CT imaging of the abdomen and pelvis was performed using the standard protocol following bolus administration of intravenous contrast.  CONTRAST:  129mL OMNIPAQUE IOHEXOL 300 MG/ML  SOLN  COMPARISON:  11/26/2014  FINDINGS: Lower chest: Small bilateral pleural effusions are increased in volume from previous exam. There is overlying compressive type atelectasis and consolidation.  Hepatobiliary: Moderate intrahepatic biliary ductal dilatation with pneumobilia is again identified and appears similar to the previous exam. Stable low-attenuation lesions  in segment 3 and segment 6 of the liver compatible with small cysts. At this time there there is a subtle, low-attenuation structure within segment 7 of the liver which measures 1.2 cm, image 7/series 2. This is similar to previous examination in may represent an area of metastatic disease. Other previously demonstrated areas of liver metastasis are not well seen on the current exam. The common bile duct stent appears similarly pleasant shins than on the previous exam. On the current exam the common bile duct measures 2.8 cm, image 40/ series 4. Previously 2.1 cm  Pancreas: No discrete pancreas mass identified. Fatty replacement of the pancreatic parenchyma is identified.  Spleen: The spleen measures 14 cm in length, image 57 of series 4. Previously 11 cm. No focal splenic abnormality noted.  Adrenals/Urinary Tract: The adrenal glands are both normal. Unremarkable appearance of the kidneys.  Stomach/Bowel: The stomach is normal. The small bowel loops have a normal course and caliber. No evidence for obstruction. Normal appearance of the colon stress set normal appearance of the proximal colon. There are multiple sigmoid diverticula without acute inflammation.  Vascular/Lymphatic: Calcified atherosclerotic disease involves the abdominal aorta. No aneurysm. No enlarged retroperitoneal or mesenteric adenopathy. No enlarged pelvic or inguinal lymph nodes.  Reproductive: Brachytherapy implants throughout the prostate gland noted. Seminal vesicles are atrophic.  Other: Small amount of ascites is identified within the abdomen and pelvis. New from previous exam.  Musculoskeletal: Scattered well defined sclerotic foci are identified within the osseous structures and are favored to represent small bone islands. No aggressive lytic or sclerotic bone lesions identified.  IMPRESSION: 1. With respect to the patient's pancreatic malignancy findings on today's are exam are not significantly changed compared with previous study.  There is a lesion within the dome of liver which is unchanged from 09/17/2014 in may represent an area of liver metastasis. This was not well seen on study from 11/26/2014. 2. Biliary dilatation and pneumobilia. The degree of intrahepatic bile duct dilatation is not significantly changed. There has been mild increase caliber of the common bile duct. 3. New splenomegaly and ascites. Findings may reflect changes of portal venous hypertension secondary to patient's worsening liver function. 4. Increase in volume of bilateral pleural effusions.   Electronically Signed   By: Kerby Moors M.D.   On: 12/14/2014 17:02   Ct Abdomen Pelvis W Contrast  11/26/2014   CLINICAL DATA:  81 year old male with history of metastatic pancreatic cancer, completed oral chemotherapy yesterday. Followup examination.  EXAM: CT ABDOMEN AND PELVIS WITH CONTRAST  TECHNIQUE: Multidetector CT imaging of the abdomen and pelvis was performed  using the standard protocol following bolus administration of intravenous contrast.  CONTRAST:  180mL OMNIPAQUE IOHEXOL 300 MG/ML  SOLN  COMPARISON:  CT of the abdomen and pelvis 09/17/2014.  FINDINGS: Lower chest: Small left and trace right pleural effusions layering dependently. Cardiomegaly with left ventricular dilatation.  Hepatobiliary: Well-defined low-attenuation lesions in segment 3 and segment 6 are unchanged compared to prior examinations, compatible with small cysts. Previously noted ill-defined metastatic lesions are not confidently identified on today's examination, and no new lesions are noted. Subtle area of hypervascularity adjacent to the gallbladder fossa, favored to reflect a perfusion anomaly. Status post cholecystectomy. Intrahepatic biliary ductal dilatation with large volume of pneumobilia, particularly in the left lobe of the liver, similar to the prior examination. Biliary stent in the common bile duct appears similarly positioned.  Pancreas: No discrete pancreatic mass identified.  Pancreatic atrophy. No pancreatic or peripancreatic fluid or inflammatory changes.  Spleen: Unremarkable.  Adrenals/Urinary Tract: Bilateral adrenal glands and bilateral kidneys are normal in appearance. No hydroureteronephrosis. Urinary bladder is normal in appearance.  Stomach/Bowel: Normal appearance of the stomach. No pathologic dilatation of small bowel or colon. Persistent mass-like asymmetric thickening of the distal rectum shortly above the anorectal junction appears similar to the prior examination. Numerous colonic diverticulae are noted, particularly in the sigmoid colon, without surrounding inflammatory changes to suggest an acute diverticulitis at this time.  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 No lymphadenopathy noted in the abdomen or pelvis.  Reproductive: Brachytherapy implants throughout the prostate gland. Seminal vesicles are atrophic.  Other: No significant volume of ascites.  No pneumoperitoneum.  Musculoskeletal: Unchanged 12 mm sclerotic lesion in the right ilium just lateral to the sacroiliac joint compared to prior study 12/06/2013, presumably a bone island or potentially a treated metastatic lesion. There are no other aggressive appearing lytic or blastic lesions noted in the visualized portions of the skeleton.  IMPRESSION: 1. With respect to the patient's pancreatic malignancy, findings on today's examination are essentially stable compared to the prior study, with no definite new metastatic lesions in the liver, and lack of visualization of the previously treated metastatic lesions in the liver. 2. Small area of hypervascularity adjacent to the gallbladder fossa in the liver is favored to represent a perfusion anomaly, particularly given that the primary pancreatic lesion was not  hypervascular. Attention on followup studies is recommended. 3. Persistent mass-like thickening of the distal rectum immediately above the anorectal junction. This is concerning for potential neoplasm, and correlation with physical examination and/or sigmoidoscopy is recommended. 4. Extensive colonic diverticulosis without findings to suggest acute diverticulitis at this time. 5. Patent stent in the common bile duct with large amount of pneumobilia redemonstrated. 6. Trace right and small left pleural effusions layering dependently, new compared to the prior examination. 7. Cardiomegaly with left ventricular dilatation. 8. Additional findings, as above, similar to prior examinations.   Electronically Signed   By: Vinnie Langton M.D.   On: 11/26/2014 12:00   Dg Chest Port 1 View  12/14/2014   CLINICAL DATA:  Shortness of breath.  EXAM: PORTABLE CHEST - 1 VIEW  COMPARISON:  July 19, 2014.  FINDINGS: Stable cardiomediastinal silhouette. No pneumothorax or pleural effusion is noted. Stable right internal jugular Port-A-Cath is noted with distal tip overlying expected position of SVC. Increased central pulmonary vascular congestion is noted as well as bilateral  interstitial densities concerning for pulmonary edema. Bony thorax is intact.  IMPRESSION: Increased central pulmonary vascular congestion is noted with probable bilateral pulmonary edema.   Electronically Signed   By: Marijo Conception, M.D.   On: 12/14/2014 14:38    Microbiology: Recent Results (from the past 240 hour(s))  Blood culture (routine x 2)     Status: None (Preliminary result)   Collection Time: 12/14/14  2:20 PM  Result Value Ref Range Status   Specimen Description BLOOD RFARM  Final   Special Requests BOTTLES DRAWN AEROBIC AND ANAEROBIC 5CC  Final   Culture   Final           BLOOD CULTURE RECEIVED NO GROWTH TO DATE CULTURE WILL BE HELD FOR 5 DAYS BEFORE ISSUING A FINAL NEGATIVE REPORT Performed at [facility]    Report  Status PENDING  Incomplete  Urine culture     Status: None   Collection Time: 12/14/14  3:18 PM  Result Value Ref Range Status   Specimen Description URINE, CATHETERIZED  Final   Special Requests Immunocompromised  Final   Colony Count NO GROWTH Performed at [facility]   Final   Culture NO GROWTH Performed at [facility]   Final   Report Status 12/15/2014 FINAL  Final  Blood culture (routine x 2)     Status: None (Preliminary result)   Collection Time: 12/14/14  3:40 PM  Result Value Ref Range Status   Specimen Description BLOOD RAC  Final   Special Requests BOTTLES DRAWN AEROBIC AND ANAEROBIC 5CC  Final   Culture   Final           BLOOD CULTURE RECEIVED NO GROWTH TO DATE CULTURE WILL BE HELD FOR 5 DAYS BEFORE ISSUING A FINAL NEGATIVE REPORT Performed at [facility]    Report Status PENDING  Incomplete  MRSA PCR Screening     Status: None   Collection Time: 12/14/14  7:10 PM  Result Value Ref Range Status   MRSA by PCR NEGATIVE NEGATIVE Final    Comment:        The GeneXpert MRSA Assay (FDA approved for NASAL specimens only), is one component of a comprehensive MRSA colonization surveillance program. It is not intended to diagnose MRSA infection nor to guide or monitor treatment for MRSA infections.      Labs: Basic Metabolic Panel:  Recent Labs Lab 12/14/14 1429 12/15/14 0335 12/16/14 0425  NA 138 136 137  K 4.4 4.3 4.3  CL 100* 103 104  CO2 31 27 26   GLUCOSE 148* 148* 273*  BUN 17 14 22*  CREATININE 0.72 0.73 0.99  CALCIUM 8.6* 8.0* 8.0*   Liver Function Tests:  Recent Labs Lab 12/14/14 1429 12/15/14 0335 12/16/14 0425  AST 357* 171* 57*  ALT 79* 79* 44  ALKPHOS 673* 510* 342*  BILITOT 2.5* 4.4* 0.9  PROT 6.3* 6.0* 5.4*  ALBUMIN 2.9* 2.6* 2.2*   No results for input(s): LIPASE, AMYLASE in the last 168 hours.  Recent Labs Lab 12/14/14 1820  AMMONIA 16   CBC:  Recent Labs Lab 12/14/14 1429 12/15/14 0335  12/16/14 0425 12/17/14 2112  WBC 2.5* 7.1 7.2  --   HGB 17.9* 8.4* 8.4* 10.1*  HCT 54.4* 25.9* 25.8* 31.3*  MCV 106.0* 107.0* 107.5*  --   PLT 88* 212 253  --    Cardiac Enzymes: No results for input(s): CKTOTAL, CKMB, CKMBINDEX, TROPONINI in the last 168 hours. BNP: BNP (last 3 results)  Recent Labs  12/14/14 1429  BNP 483.1*    ProBNP (last 3 results) No results for input(s): PROBNP in the last 8760 hours.  CBG:  Recent Labs Lab 12/17/14 0728 12/17/14 1136 12/17/14 1700 12/17/14 2103 12/18/14 0718  GLUCAP 170* 143* 166* 166* 128*       Signed:  Lillar Bianca  [facility] 12/18/2014, 9:40 AM

## 2014-12-18 NOTE — Progress Notes (Signed)
[name] [name] looks better. He got 2 units of blood. This seemed to help.  He appears to be more alert. He is oriented.  He is hungry. I much or so needs to help feed him or at least order food for him.  I agree with stopping antibiotics if cultures are all negative.  Hopefully, the Foley catheter can come out on him soon.  While he is in the hospital, I want to make sure that I check his Doppler of the left leg to see if there is any thrombus. He has been on ELIQUIS.  I think that some physical therapy might help him. I probably would order this.  On his physical exam, his vital signs are stable. His blood pressure 131/62. He is afebrile. His lungs sound clear. Oral exam is somewhat dry area and cardiac exam regular rate and rhythm with an occasional extra beat. Abdomen is soft. He has no obvious fluid wave. There is no palpable abdominal mass. There is no palpable liver or spleen tip. Extremities shows no clubbing, cyanosis or edema.  His hemoglobin is 10.1. No other labs were done.  I'm glad that he is getting better.  At least from a cancer point of view, everything looks to be stable there. As such, we can probably continue him on the Xeloda since he is tolerating this well.  Bunnie Philips 16:7

## 2014-12-18 NOTE — Progress Notes (Signed)
Patient is set to discharge back to [facility] today. Patient & friend, Lyn aware. Discharge packet given to RN, Shirlee Limerick. [facility] will be called for transport when ready.     Raynaldo Opitz, [facility] Clinical Social Worker cell #: [phone]

## 2014-12-19 ENCOUNTER — Encounter: Payer: Self-pay | Admitting: Nurse Practitioner

## 2014-12-19 ENCOUNTER — Non-Acute Institutional Stay (SKILLED_NURSING_FACILITY): Payer: Medicare Other | Admitting: Nurse Practitioner

## 2014-12-19 ENCOUNTER — Telehealth: Payer: Self-pay | Admitting: *Deleted

## 2014-12-19 DIAGNOSIS — K5900 Constipation, unspecified: Secondary | ICD-10-CM | POA: Diagnosis not present

## 2014-12-19 DIAGNOSIS — K219 Gastro-esophageal reflux disease without esophagitis: Secondary | ICD-10-CM | POA: Diagnosis not present

## 2014-12-19 DIAGNOSIS — E1142 Type 2 diabetes mellitus with diabetic polyneuropathy: Secondary | ICD-10-CM

## 2014-12-19 DIAGNOSIS — G609 Hereditary and idiopathic neuropathy, unspecified: Secondary | ICD-10-CM | POA: Diagnosis not present

## 2014-12-19 DIAGNOSIS — D638 Anemia in other chronic diseases classified elsewhere: Secondary | ICD-10-CM | POA: Diagnosis not present

## 2014-12-19 DIAGNOSIS — G4700 Insomnia, unspecified: Secondary | ICD-10-CM | POA: Diagnosis not present

## 2014-12-19 DIAGNOSIS — I82402 Acute embolism and thrombosis of unspecified deep veins of left lower extremity: Secondary | ICD-10-CM

## 2014-12-19 DIAGNOSIS — C259 Malignant neoplasm of pancreas, unspecified: Secondary | ICD-10-CM

## 2014-12-19 DIAGNOSIS — I10 Essential (primary) hypertension: Secondary | ICD-10-CM

## 2014-12-19 DIAGNOSIS — I5022 Chronic systolic (congestive) heart failure: Secondary | ICD-10-CM | POA: Diagnosis not present

## 2014-12-19 NOTE — Assessment & Plan Note (Signed)
Blood pressure controlled. Off Coreg 12.5mg  bid

## 2014-12-19 NOTE — Assessment & Plan Note (Signed)
5/27/6-5/31/16 hospital-2 units of PRBC transfusion. 12/17/14 Hgb 10.1

## 2014-12-19 NOTE — Assessment & Plan Note (Signed)
12/20/13 Hgb A1c 7.3 04/09/14 CBG am 150-180, pm 186/235-adding Lantus 3 units qd qhs.  05/28/14 update Hgb A1c 7.1 -- Continue Lantus 3 units daily.

## 2014-12-19 NOTE — Telephone Encounter (Signed)
CRITICAL VALUE STICKER  CRITICAL VALUE: + Blood culture for gram negative rods - anaerobic bottle only; culture drawn on 5/27 in ED at hospital  RECEIVER [facility]): MOJ  DATE & TIME NOTIFIED: 12/19/14 4:03 pm  MD NOTIFIED: MD [place]: 4:05pm  RESPONSE:

## 2014-12-19 NOTE — Telephone Encounter (Signed)
RESPONSE: MD Sarajane Jews advised patient to go back to ED. Attempted to contact patient at [facility] but was not successful. Will re-attempt in morning.

## 2014-12-19 NOTE — Assessment & Plan Note (Signed)
Takes ranitidine 150mg  bid. Stable.

## 2014-12-19 NOTE — Assessment & Plan Note (Signed)
C/o sleeps too much, change Temazepam 15mg  qhs to prn and Lorazepam 0.5mg  qhs for sleep. The patient stated still cannot sleep at night-improved since 08/13/14 Mirtazapine 7.5mg  po qhs.  Stable, continue Mirtazapine 7.5mg  nightly and prn Temazepam 15mg  hs and Lorazepam 1mg  q6h prn.

## 2014-12-19 NOTE — Assessment & Plan Note (Signed)
Managed by Oncology, takes Xeloda. Oxycodone 5mg  bid. Tylenol, Norco, Morphine, Zofran, Ativan, and Compazine prn for comfort measures.

## 2014-12-19 NOTE — Assessment & Plan Note (Signed)
Compensated clinically. Trace ankle edema.

## 2014-12-19 NOTE — Assessment & Plan Note (Signed)
Takes Pregabalin 75mg  daily. Ambulates with walker on unit

## 2014-12-19 NOTE — Assessment & Plan Note (Signed)
Stable, takes Colace 100mg  bid, MiraLax bid, DulcoLax suppository nightly prn

## 2014-12-19 NOTE — Progress Notes (Signed)
Patient ID: [name] [name], male   DOB: 22-Mar-1923, 81 y.o.   MRN: [id number]   Code Status: DNR  No Known Allergies  Chief Complaint  Patient presents with  . Medical Management of Chronic Issues  . Acute Visit  . Hospitalization Follow-up    HPI: Patient is a 81 y.o. male seen in the SNF at [facility] today for evaluation of hospitalization and chronic medical conditions.     Hospitalized from 12/14/2014--12/18/2014 for the following medical issues:  1. SIRS: . Initially admitted to step down. Started on broad spectrum antibiotics and fluid resuscitation. His fever has resolved and his bp improved. Blood cultures were  negative so far. Discontinued antibiotics. Tapered stress dose steroids as deemed appropriate.  2. Elevated liver function tests: improving.  3. Hypoxia: Resolved. No pneumonia found on CXR.  4. Metastatic Pancreatic Cancer: xeloda being held for now, in view of fever on admission and SIRS, it can be resumed as per oncology.  5. Acute encephalopathy: unclear etiology. Resolved. Started regular diet.       Problem List Items Addressed This Visit    Type 2 diabetes mellitus (Chronic)    12/20/13 Hgb A1c 7.3 04/09/14 CBG am 150-180, pm 186/235-adding Lantus 3 units qd qhs.  05/28/14 update Hgb A1c 7.1 -- Continue Lantus 3 units daily.        Hereditary and idiopathic peripheral neuropathy (Chronic)    Takes Pregabalin 23m daily. Ambulates with walker on unit        HYPERTENSION, BENIGN (Chronic)    Blood pressure controlled. Off Coreg 12.530mbid        SYSTOLIC HEART FAILURE, CHRONIC (Chronic)    Compensated clinically. Trace ankle edema.         Anemia of chronic disease - Primary (Chronic)    5/27/6-5/31/16 hospital-2 units of PRBC transfusion. 12/17/14 Hgb 10.1       Pancreatic cancer (Chronic)    Managed by Oncology, takes Xeloda. Oxycodone 39m39mid. Tylenol, Norco, Morphine, Zofran, Ativan, and Compazine prn for comfort measures.        DVT (deep venous thrombosis) (Chronic)    Continue Eliquis 39mg39md.       GERD    Takes ranitidine 150mg139m. Stable.         Insomnia    C/o sleeps too much, change Temazepam 139mg 38mto prn and Lorazepam 0.39mg qh30mor sleep. The patient stated still cannot sleep at night-improved since 08/13/14 Mirtazapine 7.39mg po 54m.  Stable, continue Mirtazapine 7.39mg nigh19m and prn Temazepam 139mg hs a66morazepam 1mg q6h pr23m         Constipation    Stable, takes Colace 100mg bid, M44max bid, DulcoLax suppository nightly prn          Review of Systems:  Review of Systems  Constitutional: Positive for fatigue. Negative for fever, chills, diaphoresis, activity change and appetite change.  HENT: Positive for hearing loss. Negative for congestion, ear discharge, ear pain, nosebleeds, sore throat and tinnitus.   Eyes: Negative for photophobia, pain, discharge and redness.  Respiratory: Negative for cough, shortness of breath, wheezing and stridor.   Cardiovascular: Positive for leg swelling. Negative for chest pain and palpitations.       Trace ankles.   Gastrointestinal: Negative for nausea, vomiting, abdominal pain, diarrhea, constipation and blood in stool.  Endocrine: Negative for polydipsia.  Genitourinary: Positive for frequency. Negative for dysuria, urgency, hematuria and flank pain.  Musculoskeletal: Negative for myalgias, back pain and neck  pain.  Skin: Negative for rash.       Left great toe nail came off R upper chest Pot A Cath intact.  The left 3rd MCP swelling, tenderness, and warmth  Allergic/Immunologic: Negative for environmental allergies.  Neurological: Negative for dizziness, tremors, seizures, weakness and headaches.  Hematological: Does not bruise/bleed easily.  Psychiatric/Behavioral: Negative for suicidal ideas and hallucinations. The patient is not nervous/anxious.      Past Medical History  Diagnosis Date  . Diverticulosis of colon (without  mention of hemorrhage)   . Irritable bowel syndrome   . Personal history of malignant neoplasm of prostate     s/p  radioactive seed implants 2000  . Unspecified hereditary and idiopathic peripheral neuropathy   . Nonischemic cardiomyopathy cardiologist-  dr Aundra Dubin    EF 25%  per last echo 2012 and cardiologist note--  2007-  ef  40-45%;  2008- ef 35-40%;  2010- ef 30-35%;  2012- ef 25%  . Sinus bradycardia   . Hypertension   . Arthritis     hands  . Hyperlipidemia   . History of DVT of lower extremity     2002  ,   left leg  . History of adenomatous polyp of colon     2001  . History of basal cell carcinoma excision   . Anticoagulated on Coumadin   . Type 2 diabetes, diet controlled   . GERD (gastroesophageal reflux disease)   . LBBB (left bundle branch block)   . Wears glasses   . Wears hearing aid     BILATERAL  . Unsteady gait   . DDD (degenerative disc disease)     cervical and lumbar  . DJD (degenerative joint disease)     hips  . Lower urinary tract symptoms (LUTS)   . 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 Coronary artery disease     per cath 2002  non-obstructive cad  . Chronic systolic heart failure   . H/O fracture of nose     12-30-2013-  pt fell at home open nose bone fx  . Abdominal pain 12/20/2013  . Abnormal MRI of abdomen 01/31/2014  . Acute encephalopathy 07/02/2014  . Acute thromboembolism of deep veins of lower extremity 05/03/2007    Qualifier: History of  By: Doy Mince LPN, Megan  41/93/79 Doppler LLE: DVT at left femoral and popliteal veins not completely occlusive. Will anticoagulate with Eliquis 2.71m bid.    . Anemia of chronic disease 02/20/2013  . CARDIOMYOPATHY 07/06/2007    Qualifier: Diagnosis of  By: NLenna GilfordMD, SDeborra Medina  . Constipation 04/04/2014  . Coronary atherosclerosis 07/06/2007    Qualifier: Diagnosis of  By: NLenna GilfordMD, [place] DISEASE 05/03/2007    Qualifier: Diagnosis of  By: RDoy MinceLPN, Megan    . DVT (deep venous thrombosis)   . Edema 05/28/2014  . Failure to thrive in adult 03/30/2014  . Fall in elderly patient 12/30/2013  . Fever 07/20/2014  . Frailty 03/30/2014  . FUO (fever of unknown origin) 05/28/2014    05/28/14 CXR right lower lobe bronchopneumonia.    .Marland KitchenGERD 05/03/2007    Qualifier: Diagnosis of  By: RDoy MinceLPN, Megan    . Hereditary and idiopathic peripheral neuropathy 07/06/2007    Qualifier: Diagnosis of  By: NLenna GilfordMD, SDeborra Medina  . HYPERLIPIDEMIA 05/03/2007    Qualifier: Diagnosis  of  By: Doy Mince LPN, Megan    . HYPERTENSION, BENIGN 06/23/2010    Qualifier: Diagnosis of  By: Olevia Perches, MD, Glenetta Hew   . Insomnia 04/04/2014  . Liver metastasis 04/01/2014  . Malnutrition of moderate degree 03/30/2014    05/01/14 albumin 2.8 05/29/14 albumin 3.0   . Metastatic cancer 03/29/2014  . Normocytic anemia 03/30/2014    04/10/14 Hgb 9.3   . Pancreatic cancer 04/06/2014    06/06/14 Oncology: stage IV pancreatic cancer with Mets to liver: CA 19-9 400--194. CT showed more liver metastases. Xeloda 1569m bid x 14days on and 7 days off. 3 cycles of tx and then repeat the scans.    . Pressure ulcer stage II 08/15/2014    08/14/14 L scrotum small PU-not infected-tx initiated.    . Protein-calorie malnutrition, severe 07/05/2014  . SPINAL STENOSIS, LUMBAR 07/06/2007    Qualifier: Diagnosis of  By: NLenna GilfordMD, SDeborra Medina  . Streptococcal bacteremia 07/23/2014  . Transaminitis 03/30/2014    04/10/14 Alkaline phosphatase 126(39-117) 05/01/14 AST 33, ALT 45, alk  Phos 75 05/29/14 AST 64, ALT 70 alk phos 209   . Type 2 diabetes mellitus 05/03/2007    Qualifier: Diagnosis of  By: RDoy MinceLPN, Megan  170/01/74Hgb A1c 7.1    . Weakness generalized 04/02/2014   Past Surgical History  Procedure Laterality Date  . Knee arthroscopy Left 1991  . Ercp with sphincterotomy and stone removal  05/ 1993  &  05-11-2006  . Total knee  arthroplasty  10/05/2011    Procedure: TOTAL KNEE ARTHROPLASTY;  Surgeon: FGearlean Alf MD;  Location: [facility];  Service: Orthopedics;  Laterality: Right;  . Transurethral resection of bladder tumor N/A 09/30/2012    Procedure: TRANSURETHRAL RESECTION OF BLADDER TUMOR (TURBT);  Surgeon: SFranchot Gallo MD;  Location: [facility];  Service: Urology;  Laterality: N/A;  WITH MITOMYCIN INSTILLATION   . Eus N/A 01/04/2014    Procedure: ESOPHAGEAL ENDOSCOPIC ULTRASOUND (EUS) RADIAL;  Surgeon: DMilus Banister MD;  Location: [facility];  Service: Endoscopy;  Laterality: N/A;  . Radioactive prostate seed implants  01/ 2000  . Mohs surgery  03/ 2009    LEFT EAR BASAL CELL CARCINOMA  . Tonsillectomy  AS CHILD  . Cholecystectomy open  1985  . Appendectomy  child  . Inguinal hernia repair Bilateral 1998    recurrent RIH  w/ repair 06-21-2006  . Total knee arthroplasty Left 02-21-2007    AND 04-21-2007  CLOSED MANIPULATION  . Cataract extraction w/ intraocular lens implant Right 2004  . Lumbar spine surgery  X2  . Cardiac catheterization  06-14-2001  dr bDarnell Levelbrodie    non-obstructive CAD/  LM 30%,  pLAD 50%,  mLAD 30%,  RCA  &  CFX  irregularites ,  global hypokinesis,  ef 40%  . Cardiovascular stress test  02-07-2007   dr bDarnell Levelbrodie    negative for ischemia,  low risk myoview perfusion study  . Transthoracic echocardiogram  01-28-2011    moderate LVH/  ef 25%/  diffuse hypokinesis/  mild LAE/  mild TR/  trivial AR & MR  . Cystoscopy w/ retrogrades Bilateral 03/09/2014    Procedure: CYSTOSCOPY WITH BILATERAL RETROGRADE PYELOGRAM BLADDER BX AND RENAL WASHINGS.;  Surgeon: SJorja Loa MD;  [facility]  Service: Urology;  Laterality: Bilateral;  . Eus N/A 04/05/2014    Procedure: UPPER ENDOSCOPIC ULTRASOUND (EUS) LINEAR;  Surgeon: DMilus Banister MD;  Location: [facility];  Service:  Endoscopy;  Laterality: N/A;  radial linear  . Endoscopic retrograde  cholangiopancreatography (ercp) with propofol N/A 04/12/2014    Procedure: ENDOSCOPIC RETROGRADE CHOLANGIOPANCREATOGRAPHY (ERCP) WITH PROPOFOL;  Surgeon: [name], MD;  Location: [facility];  Service: Endoscopy;  Laterality: N/A;  metal stent  . Biliary stent placement N/A 04/12/2014    Procedure: BILIARY STENT PLACEMENT;  Surgeon: [name], MD;  Location: [facility];  Service: Endoscopy;  Laterality: N/A;   Social History:   reports that he quit smoking about 32 years ago. His smoking use included Cigarettes. He started smoking about 60 years ago. He has a 10 pack-year smoking history. He has never used smokeless tobacco. He reports that he does not drink alcohol or use illicit drugs.  Family History  Problem Relation Age of Onset  . Throat cancer Brother   . Coronary artery disease Father     deceased    Medications: Patient's Medications  New Prescriptions   No medications on file  Previous Medications   ACETAMINOPHEN (TYLENOL) 325 MG TABLET    Take by mouth every 6 (six) hours as needed for fever (For fever greater than 101. Give one dose).   APIXABAN (ELIQUIS) 5 MG TABS TABLET    Take 1 tablet (5 mg total) by mouth 2 (two) times daily.   BISACODYL (DULCOLAX) 10 MG SUPPOSITORY    Place 1 suppository (10 mg total) rectally daily as needed for moderate constipation.   CAPECITABINE (XELODA) 500 MG TABLET    Take 3 tablets (1,500 mg total) by mouth 2 (two) times daily after a meal. TAKES 3 TABS (1500) MG BID  ///  ON 14 DAYS OFF 7 DAYS FOR 3 CYCLES.   STARTED 06/06/14   DOCUSATE SODIUM 100 MG CAPS    Take 100 mg by mouth 2 (two) times daily.   FEEDING SUPPLEMENT, GLUCERNA SHAKE, (GLUCERNA SHAKE) LIQD    Take 237 mLs by mouth 2 (two) times daily between meals as needed (If pt with <50% meal completion).   HYDROCODONE-ACETAMINOPHEN (NORCO) 10-325 MG PER TABLET    Take one tablet by mouth every 4 hours as needed for pain   INSULIN GLARGINE (LANTUS) 100 UNIT/ML INJECTION     Inject 3 Units into the skin at bedtime.   LACTULOSE (CHRONULAC) 10 GM/15ML SOLUTION    Take 20 g by mouth daily as needed for mild constipation (10m as needed for constipation).    LIDOCAINE-PRILOCAINE (EMLA) CREAM    Apply 1 application topically 6 (six) times daily. Apply topically every 4 hours as needed for pain   LORAZEPAM (ATIVAN) 0.5 MG TABLET    Take 2 tablets (1 mg total) by mouth every 6 (six) hours as needed (nausea/vomiting).   MIRTAZAPINE (REMERON) 7.5 MG TABLET    Take 7.5 mg by mouth at bedtime.   MORPHINE (ROXANOL) 20 MG/ML CONCENTRATED SOLUTION    Take 5 mg by mouth every 2 (two) hours as needed for severe pain (Give 0.243m5mg).   MULTIPLE VITAMIN (MULTIVITAMIN WITH MINERALS) TABS TABLET    Take 1 tablet by mouth daily.   MULTIPLE VITAMINS-MINERALS (PRESERVISION AREDS 2) CAPS    Take 1 capsule by mouth 2 (two) times daily.   NITROGLYCERIN (NITROSTAT) 0.4 MG SL TABLET    Place 1 tablet (0.4 mg total) under the tongue every 5 (five) minutes as needed for chest pain.   OMEGA-3 FATTY ACIDS (FISH OIL) 1000 MG CAPS    Take 1 capsule by mouth.   ONDANSETRON (ZOFRAN) 8 MG  TABLET    Take by mouth 2 (two) times daily as needed for nausea or vomiting.    OXYCODONE (OXY IR/ROXICODONE) 5 MG IMMEDIATE RELEASE TABLET    Take one tablet by mouth twice daily for pain   POLYETHYLENE GLYCOL (MIRALAX / GLYCOLAX) PACKET    Take 17 g by mouth 2 (two) times daily.   POLYVINYL ALCOHOL (LIQUIFILM TEARS) 1.4 % OPHTHALMIC SOLUTION    Place 1 drop into both eyes 4 (four) times daily as needed for dry eyes.   PRAVASTATIN (PRAVACHOL) 40 MG TABLET    Take 40 mg by mouth every evening.    PREGABALIN (LYRICA) 75 MG CAPSULE    Take 75 mg by mouth daily.   PRESCRIPTION MEDICATION       PROCHLORPERAZINE (COMPAZINE) 5 MG TABLET    Take 5 mg by mouth every 6 (six) hours as needed for nausea or vomiting.   RANITIDINE (ZANTAC) 150 MG TABLET    Take 150 mg by mouth 2 (two) times daily.   TEMAZEPAM (RESTORIL) 15 MG  CAPSULE    Take 15 mg by mouth at bedtime.   Modified Medications   No medications on file  Discontinued Medications   No medications on file     Physical Exam: Physical Exam  Constitutional: He is oriented to person, place, and time. He appears well-developed and well-nourished.  HENT:  Head: Normocephalic and atraumatic.  Mouth/Throat: Oropharynx is clear and moist.  Eyes: Conjunctivae and EOM are normal. Pupils are equal, round, and reactive to light. Right eye exhibits no discharge. Left eye exhibits no discharge. No scleral icterus.  Neck: Normal range of motion. Neck supple. No JVD present. No tracheal deviation present. No thyromegaly present.  Cardiovascular: Normal rate, regular rhythm and normal heart sounds.   Pulmonary/Chest: Effort normal and breath sounds normal. No stridor. He has no wheezes. He has no rales. He exhibits no tenderness.  Abdominal: Soft. Bowel sounds are normal. He exhibits no distension. There is no tenderness. There is no rebound and no guarding.  Minimal epigastric tenderness.  No ascites  Musculoskeletal: Normal range of motion. He exhibits edema. He exhibits no tenderness.  LLE  Lymphadenopathy:    He has no cervical adenopathy.  Neurological: He is alert and oriented to person, place, and time. No cranial nerve deficit.  Skin: Skin is warm and dry. No rash noted. No erythema. No pallor.  The left 3rd MCP swelling, tenderness, and warmth  Psychiatric: He has a normal mood and affect. His behavior is normal.    Filed Vitals:   12/19/14 1634  BP: 132/82  Pulse: 80  Temp: 96.8 F (36 C)  TempSrc: Tympanic  Resp: 16      Labs reviewed: Basic Metabolic Panel:  Recent Labs  05/29/14  07/20/14 0500  12/14/14 1429 12/15/14 0335 12/16/14 0425  NA 136*  < > 137  < > 138 136 137  K 4.3  < > 4.0  < > 4.4 4.3 4.3  CL  --   < > 103  < > 100* 103 104  CO2  --   < > 27  < > 31 27 26   GLUCOSE  --   < > 122*  < > 148* 148* 273*  BUN 22*  < >  21  < > 17 14 22*  CREATININE 1.0  < > 0.99  < > 0.72 0.73 0.99  CALCIUM  --   < > 8.8  < > 8.6* 8.0* 8.0*  TSH  1.15  --  0.846  --   --   --   --   < > = values in this interval not displayed. Liver Function Tests:  Recent Labs  12/14/14 1429 12/15/14 0335 12/16/14 0425  AST 357* 171* 57*  ALT 79* 79* 44  ALKPHOS 673* 510* 342*  BILITOT 2.5* 4.4* 0.9  PROT 6.3* 6.0* 5.4*  ALBUMIN 2.9* 2.6* 2.2*    Recent Labs  03/29/14 1610 07/02/14 1845  LIPASE 10* 7*    Recent Labs  12/14/14 1820  AMMONIA 16   CBC:  Recent Labs  10/08/14 1330  11/05/14 1043  11/26/14 1155 12/14/14 1429 12/15/14 0335 12/16/14 0425 12/17/14 2112  WBC 4.8  --  6.3  --  4.9 2.5* 7.1 7.2  --   NEUTROABS 2.9  --  4.4  --  3.4  --   --   --   --   HGB 9.5*  < > 9.4*  < > 9.9* 17.9* 8.4* 8.4* 10.1*  HCT 28.6*  < > 28.4*  < > 29.6* 54.4* 25.9* 25.8* 31.3*  MCV 108*  --  108*  --  108* 106.0* 107.0* 107.5*  --   PLT 193  < > 227  < > 219 88* 212 253  --   < > = values in this interval not displayed. Lipid Panel: No results for input(s): CHOL, HDL, LDLCALC, TRIG, CHOLHDL, LDLDIRECT in the last 8760 hours.  Past Procedures:  03/29/14 CT abd and pelvis w CM:  IMPRESSION:  4.9 cm enhancing lesion adjacent to the pancreatic head/uncinate  process, worrisome for primary pancreatic adenocarcinoma,  progressed.  Tumor likely involves the SMA/SMV and undersurface of the portal  vein. 9 mm short axis node in the porta hepatitis.  Suspected multifocal hepatic metastases, including a 3.2 cm lesion  in the right hepatic dome.   03/30/14 MR w wo CM abd:   IMPRESSION:  1. Subtle amorphous mass in the pancreatic head and proximal  pancreatic body currently measuring 4.4 x 3.2 cm, causing distal  common bile duct obstruction and demonstrating vascular involvement  (SMA, SMV, splenic vein, splenic portal confluence and proximal  portal vein), as above, highly concerning for primary  pancreatic  neoplasm.  2. Although there do appear to be multiple ill-defined hepatic  lesions, concerning for metastatic disease, these were poorly  evaluated on today's examination secondary to patient respiratory  motion. Continued attention on any future followup studies is  recommended, as metastatic disease to the liver is suspected.  3. Severe intra and extrahepatic biliary ductal dilatation.   05/04/14 Doppler LLE: DVT at left femoral and popliteal veins not completely occlusive. Will anticoagulate with Eliquis 2.1m bid.    07/20/14 CT abd and pelvis:   IMPRESSION: 1. New tiny pleural effusions with adjacent atelectasis/ infiltrate. 2. Stable poorly marginated hepatic lesions suggesting metastatic disease. 3. Little change in pancreatic mass with patent metallic biliary stent and pneumobilia.  11/26/14 CT abd and pelvis with contrast:  1 no definite new metastatic lesions in the liver and lack of visualization of the previously treated metastatic lesions in the liver 2 small area of hypervascularity adjacent to the gallbladder fossa in the liver is favored to represent a perfusion anomaly.  3 persistent mass like thickening of the distal rectum immedialtely above the anorectal junction-concerning for potential neoplasm.  4 extensive colonic diverticulosis  5 patent stent in the common bile duct  11/28/14 X-ray Left hand: no acute dislocation or fracture in the  left hand.   12/14/14 CT head wo contrast:  IMPRESSION: No acute findings. Stable atrophy and small vessel disease  12/14/14 CT abd and pelvis w contrast:   IMPRESSION: 1. With respect to the patient's pancreatic malignancy findings on today's are exam are not significantly changed compared with previous study. There is a lesion within the dome of liver which is unchanged from 09/17/2014 in may represent an area of liver metastasis. This was not well seen on study from 11/26/2014. 2. Biliary dilatation  and pneumobilia. The degree of intrahepatic bile duct dilatation is not significantly changed. There has been mild increase caliber of the common bile duct. 3. New splenomegaly and ascites. Findings may reflect changes of portal venous hypertension secondary to patient's worsening liver function. 4. Increase in volume of bilateral pleural effusions.  12/14/14 CXR  IMPRESSION: Increased central pulmonary vascular congestion is noted with probable bilateral pulmonary edema.   Assessment/Plan Anemia of chronic disease 5/27/6-5/31/16 hospital-2 units of PRBC transfusion. 12/17/14 Hgb 10.1    Type 2 diabetes mellitus 12/20/13 Hgb A1c 7.3 04/09/14 CBG am 150-180, pm 186/235-adding Lantus 3 units qd qhs.  05/28/14 update Hgb A1c 7.1 -- Continue Lantus 3 units daily.     DVT (deep venous thrombosis) Continue Eliquis 4m bid.    Constipation Stable, takes Colace 1026mbid, MiraLax bid, DulcoLax suppository nightly prn    Insomnia C/o sleeps too much, change Temazepam 1559mhs to prn and Lorazepam 0.5mg40ms for sleep. The patient stated still cannot sleep at night-improved since 08/13/14 Mirtazapine 7.5mg 2mqhs.  Stable, continue Mirtazapine 7.5mg n39mtly and prn Temazepam 15mg h33md Lorazepam 1mg q6h9mn.       Pancreatic cancer Managed by Oncology, takes Xeloda. Oxycodone 5mg bid.1mlenol, Norco, Morphine, Zofran, Ativan, and Compazine prn for comfort measures.    GERD Takes ranitidine 150mg bid.33mble.      Hereditary and idiopathic peripheral neuropathy Takes Pregabalin 75mg daily89mbulates with walker on unit     SYSTOLIC HEART FAILURE, CHRONIC Compensated clinically. Trace ankle edema.      HYPERTENSION, BENIGN Blood pressure controlled. Off Coreg 12.5mg bid    81mFamily/ Staff Communication: observe the patient  Goals of Care: SNF  Labs/tests ordered: none

## 2014-12-19 NOTE — Assessment & Plan Note (Signed)
Continue Eliquis 5 mg b.i.d..

## 2014-12-20 ENCOUNTER — Other Ambulatory Visit: Payer: Self-pay | Admitting: Pharmacist

## 2014-12-20 ENCOUNTER — Ambulatory Visit (HOSPITAL_BASED_OUTPATIENT_CLINIC_OR_DEPARTMENT_OTHER)
Admission: RE | Admit: 2014-12-20 | Discharge: 2014-12-20 | Disposition: A | Discharge status: Home / Self Care | Payer: Medicare Other | Source: Ambulatory Visit | Attending: Family | Admitting: Family

## 2014-12-20 ENCOUNTER — Ambulatory Visit (HOSPITAL_BASED_OUTPATIENT_CLINIC_OR_DEPARTMENT_OTHER): Payer: Medicare Other | Admitting: Family

## 2014-12-20 ENCOUNTER — Ambulatory Visit (HOSPITAL_BASED_OUTPATIENT_CLINIC_OR_DEPARTMENT_OTHER): Payer: Medicare Other

## 2014-12-20 ENCOUNTER — Other Ambulatory Visit: Payer: Medicare Other

## 2014-12-20 ENCOUNTER — Other Ambulatory Visit: Payer: Self-pay | Admitting: Family

## 2014-12-20 VITALS — BP 131/55 | HR 63 | Temp 98.6°F | Resp 18

## 2014-12-20 DIAGNOSIS — C787 Secondary malignant neoplasm of liver and intrahepatic bile duct: Secondary | ICD-10-CM

## 2014-12-20 DIAGNOSIS — M19042 Primary osteoarthritis, left hand: Secondary | ICD-10-CM | POA: Insufficient documentation

## 2014-12-20 DIAGNOSIS — C250 Malignant neoplasm of head of pancreas: Secondary | ICD-10-CM

## 2014-12-20 DIAGNOSIS — M25432 Effusion, left wrist: Secondary | ICD-10-CM

## 2014-12-20 DIAGNOSIS — M79642 Pain in left hand: Secondary | ICD-10-CM | POA: Diagnosis not present

## 2014-12-20 DIAGNOSIS — C259 Malignant neoplasm of pancreas, unspecified: Secondary | ICD-10-CM

## 2014-12-20 DIAGNOSIS — M7989 Other specified soft tissue disorders: Secondary | ICD-10-CM | POA: Diagnosis not present

## 2014-12-20 DIAGNOSIS — M25532 Pain in left wrist: Principal | ICD-10-CM

## 2014-12-20 DIAGNOSIS — R109 Unspecified abdominal pain: Secondary | ICD-10-CM | POA: Diagnosis not present

## 2014-12-20 LAB — CULTURE, BLOOD (ROUTINE X 2): Culture: NO GROWTH

## 2014-12-20 MED ORDER — LEVOFLOXACIN IN D5W 750 MG/150ML IV SOLN
750.0000 mg | Freq: Once | INTRAVENOUS | Status: AC
Start: 2014-12-20 — End: 2014-12-20
  Administered 2014-12-20: 750 mg via INTRAVENOUS
  Filled 2014-12-20: qty 150

## 2014-12-20 NOTE — Patient Instructions (Signed)
Levofloxacin injection What is this medicine? LEVOFLOXACIN (lee voe FLOX a sin) is a quinolone antibiotic. It is used to treat certain kinds of bacterial infections. It will not work for colds, flu, or other viral infections. This medicine may be used for other purposes; ask your health care provider or pharmacist if you have questions. COMMON BRAND NAME(S): Levaquin What should I tell my health care provider before I take this medicine? They need to know if you have any of these conditions: -bone problems -cerebral disease -history of low levels of potassium in the blood -irregular heartbeat -joint problems -kidney disease -myasthenia gravis -seizure disorder -tendon problems -an unusual or allergic reaction to levofloxacin, other quinolone antibiotics, foods, dyes, or preservatives -pregnant or trying to get pregnant -breast-feeding How should I use this medicine? This medicine is for infusion into a vein. It is usually given by a health care professional in a hospital or clinic setting. If you get this medicine at home, you will be taught how to prepare and give this medicine. Use exactly as directed. Take your medicine at regular intervals. Do not take your medicine more often than directed. It is important that you put your used needles and syringes in a special sharps container. Do not put them in a trash can. If you do not have a sharps container, call your pharmacist or healthcare provider to get one. A special MedGuide will be given to you by the pharmacist with each prescription and refill. Be sure to read this information carefully each time. Talk to your pediatrician regarding the use of this medicine in children. While this drug may be prescribed for children as young as 6 months for selected conditions, precautions do apply. Overdosage: If you think you have taken too much of this medicine contact a poison control center or emergency room at once. NOTE: This medicine is only  for you. Do not share this medicine with others. What if I miss a dose? If you miss a dose, use it as soon as you can. If it is almost time for your next dose, use only that dose. Do not use double or extra doses. What may interact with this medicine? Do not take this medicine with any of the following medications - arsenic trioxide - chloroquine - droperidol - medications for irregular rhythm like amiodarone, disopyramide, dofetilide, flecainide, quinidine, procainamide, sotalol - some medicines for depression or mental problems like phenothiazines, pimozide, and ziprasidone This medicine may also interact with the following medications - amoxapine - birth control pills - cisapride - haloperidol -NSAIDS, medicines for pain and inflammation, like ibuprofen or naproxen - retinoid products like tretinoin or isotretinoin - risperidone - some other antibiotics like clarithromycin or erythromycin - theophylline - warfarin This list may not describe all possible interactions. Give your health care provider a list of all the medicines, herbs, non-prescription drugs, or dietary supplements you use. Also tell them if you smoke, drink alcohol, or use illegal drugs. Some items may interact with your medicine. What should I watch for while using this medicine? Tell your doctor or health care professional if your symptoms do not begin to improve in a few days. Drink several glasses of water a day and cut down on drinks that contain caffeine. You must not get dehydrated while taking this medicine. You may get drowsy or dizzy. Do not drive, use machinery, or do anything that needs mental alertness until you know how this medicine affects you. Do not sit or stand up quickly, especially   if you are an older patient. This reduces the risk of dizzy or fainting spells. This medicine can make you more sensitive to the sun. Keep out of the sun. If you cannot avoid being in the sun, wear protective  clothing and use a sunscreen. Do not use sun lamps or tanning beds/booths. Contact your doctor if you get a sunburn. If you are a diabetic monitor your blood glucose carefully. If you get an unusual reading stop taking this medicine and call your doctor right away. Do not treat diarrhea with over-the-counter products. Contact your doctor if you have diarrhea that lasts more than 2 days or if the diarrhea is severe and watery. What side effects may I notice from receiving this medicine? Side effects that you should report to your doctor or health care professional as soon as possible: -allergic reactions like skin rash or hives, swelling of the face, lips, or tongue -changes in vision -confusion, nightmares or hallucinations -difficulty breathing -irregular heartbeat, chest pain -joint, muscle or tendon pain -pain or difficulty passing urine -persistent headache with or without blurred vision -redness, blistering, peeling or loosening of the skin, including inside the mouth< -seizures -unusual pain, numbness, tingling, or weakness -vaginal irritation, discharge Side effects that usually do not require medical attention (report to your doctor or health care professional if they continue or are bothersome): -diarrhea -dry mouth -headache -pain, irritation at the site of injection -stomach upset, nausea -trouble sleeping This list may not describe all possible side effects. Call your doctor for medical advice about side effects. You may report side effects to FDA at 1-800-FDA-1088. Where should I keep my medicine? Keep out of the reach of children. If you are using this medicine at home, you will be instructed on how to store this medicine. Throw away any unused medicine after the expiration date on the label. NOTE: This sheet is a summary. It may not cover all possible information. If you have questions about this medicine, talk to your doctor, pharmacist, or health care provider.  2015,  [employer]. (2013-03-09 10:54:33)

## 2014-12-20 NOTE — Telephone Encounter (Signed)
Called and spoke to patient's nurse. Patient's nurse aware and is making arrangements for patient to go back to ED.

## 2014-12-20 NOTE — Progress Notes (Signed)
Hematology and Oncology Follow Up Visit  [name] [name] [id number] 1923-02-09 81 y.o. 12/20/2014   Principle Diagnosis:  Stage IV pancreatic cancer with mets to liver. Bilateral lower extremity DVT  Current Therapy:   Xeloda (14/7) s/p 6 cycles  ELIQUIS 2.5 mg by mouth twice a day    Interim History: [name] [name] is here today for a follow-up. He was in the hospital last week with a systemic inflammatory response. He was in the ED with a fever and AMS. He was treated with antibiotics and also got 2 units of blood. He is still not feeling well and having mid abdominal pain.  He is tearful today and does not want to be admitted back into the hospital. I spoke with Dr. Marin Olp and we will treat him here today and tomorrow with Levaquin.  He has swelling in his left hand and wrist and it is painful to try and bend and he is unable to bend his fingers. We will get an xray today.  He has some slight swelling in his legs and is wearing his compression stockings today. He continues to take Eliquis daily and has had no episodes of bruising or bleeding.  He denies fever, chills, n/v, cough, rash, headache, dizziness, SOB, chest pain, palpitations, constipation, diarrhea, blood in urine or stool.  No tenderness, numbness or tingling in his extremities. No new aches or pains.  He has a good appetite but states that he probably needs to drink more fluids. His weight is stable.   Medications:    Medication List       This list is accurate as of: 12/20/14 12:14 PM.  Always use your most recent med list.               acetaminophen 325 MG tablet  Commonly known as:  TYLENOL  Take by mouth every 6 (six) hours as needed for fever (For fever greater than 101. Give one dose).     apixaban 5 MG Tabs tablet  Commonly known as:  ELIQUIS  Take 1 tablet (5 mg total) by mouth 2 (two) times daily.     bisacodyl 10 MG suppository  Commonly known as:  DULCOLAX  Place 1 suppository (10 mg total) rectally  daily as needed for moderate constipation.     capecitabine 500 MG tablet  Commonly known as:  XELODA  Take 3 tablets (1,500 mg total) by mouth 2 (two) times daily after a meal. TAKES 3 TABS (1500) MG BID  ///  ON 14 DAYS OFF 7 DAYS FOR 3 CYCLES.   STARTED 06/06/14     DSS 100 MG Caps  Take 100 mg by mouth 2 (two) times daily.     feeding supplement (GLUCERNA SHAKE) Liqd  Take 237 mLs by mouth 2 (two) times daily between meals as needed (If pt with <50% meal completion).     Fish Oil 1000 MG Caps  Take 1 capsule by mouth.     HYDROcodone-acetaminophen 10-325 MG per tablet  Commonly known as:  NORCO  Take one tablet by mouth every 4 hours as needed for pain     insulin glargine 100 UNIT/ML injection  Commonly known as:  LANTUS  Inject 3 Units into the skin at bedtime.     lactulose 10 GM/15ML solution  Commonly known as:  CHRONULAC  Take 20 g by mouth daily as needed for mild constipation (30ml as needed for constipation).     lidocaine-prilocaine cream  Commonly known as:  EMLA  Apply 1 application topically 6 (six) times daily. Apply topically every 4 hours as needed for pain     LORazepam 0.5 MG tablet  Commonly known as:  ATIVAN  Take 2 tablets (1 mg total) by mouth every 6 (six) hours as needed (nausea/vomiting).     mirtazapine 7.5 MG tablet  Commonly known as:  REMERON  Take 7.5 mg by mouth at bedtime.     morphine 20 MG/ML concentrated solution  Commonly known as:  ROXANOL  Take 5 mg by mouth every 2 (two) hours as needed for severe pain (Give 0.69ml=5mg ).     multivitamin with minerals Tabs tablet  Take 1 tablet by mouth daily.     nitroGLYCERIN 0.4 MG SL tablet  Commonly known as:  NITROSTAT  Place 1 tablet (0.4 mg total) under the tongue every 5 (five) minutes as needed for chest pain.     oxyCODONE 5 MG immediate release tablet  Commonly known as:  Oxy IR/ROXICODONE  Take one tablet by mouth twice daily for pain     polyethylene glycol packet    Commonly known as:  MIRALAX / GLYCOLAX  Take 17 g by mouth 2 (two) times daily.     polyvinyl alcohol 1.4 % ophthalmic solution  Commonly known as:  LIQUIFILM TEARS  Place 1 drop into both eyes 4 (four) times daily as needed for dry eyes.     pravastatin 40 MG tablet  Commonly known as:  PRAVACHOL  Take 40 mg by mouth every evening.     pregabalin 75 MG capsule  Commonly known as:  LYRICA  Take 75 mg by mouth daily.     PRESCRIPTION MEDICATION     PRESERVISION AREDS 2 Caps  Take 1 capsule by mouth 2 (two) times daily.     prochlorperazine 5 MG tablet  Commonly known as:  COMPAZINE  Take 5 mg by mouth every 6 (six) hours as needed for nausea or vomiting.     ranitidine 150 MG tablet  Commonly known as:  ZANTAC  Take 150 mg by mouth 2 (two) times daily.     temazepam 15 MG capsule  Commonly known as:  RESTORIL  Take 15 mg by mouth at bedtime.     ZOFRAN 8 MG tablet  Generic drug:  ondansetron  Take by mouth 2 (two) times daily as needed for nausea or vomiting.        Allergies: No Known Allergies  Past Medical History, Surgical history, Social history, and Family History were reviewed and updated.  Review of Systems: All other 10 point review of systems is negative.   Physical Exam:  vitals were not taken for this visit.  Wt Readings from Last 3 Encounters:  12/14/14 173 lb 15.1 oz (78.9 kg)  11/26/14 170 lb (77.111 kg)  11/05/14 170 lb (77.111 kg)    Ocular: Sclerae unicteric, pupils equal, round and reactive to light Ear-nose-throat: Oropharynx clear, dentition fair Lymphatic: No cervical or supraclavicular adenopathy Lungs no rales or rhonchi, good excursion bilaterally Heart regular rate and rhythm, no murmur appreciated Abd soft, nontender, positive bowel sounds MSK no focal spinal tenderness, no joint edema Neuro: non-focal, well-oriented, appropriate affect  Lab Results  Component Value Date   WBC 7.2 12/16/2014   HGB 10.1* 12/17/2014   HCT  31.3* 12/17/2014   MCV 107.5* 12/16/2014   PLT 253 12/16/2014   No results found for: FERRITIN, IRON, TIBC, UIBC, IRONPCTSAT Lab Results  Component Value Date   RBC 2.40* 12/16/2014  No results found for: KPAFRELGTCHN, LAMBDASER, KAPLAMBRATIO No results found for: Kandis Cocking, IGMSERUM No results found for: Odetta Pink, SPEI   Chemistry      Component Value Date/Time   NA 137 12/16/2014 0425   NA 138 11/26/2014 1156   NA 137 09/29/2014   K 4.3 12/16/2014 0425   K 3.3 11/26/2014 1156   CL 104 12/16/2014 0425   CL 102 11/26/2014 1156   CO2 26 12/16/2014 0425   CO2 29 11/26/2014 1156   BUN 22* 12/16/2014 0425   BUN 12 11/26/2014 1156   BUN 19 09/29/2014   CREATININE 0.99 12/16/2014 0425   CREATININE 0.9 11/26/2014 1156   CREATININE 0.9 09/29/2014   GLU 114 09/29/2014      Component Value Date/Time   CALCIUM 8.0* 12/16/2014 0425   CALCIUM 9.1 11/26/2014 1156   ALKPHOS 342* 12/16/2014 0425   ALKPHOS 92* 11/26/2014 1156   AST 57* 12/16/2014 0425   AST 23 11/26/2014 1156   ALT 44 12/16/2014 0425   ALT 12 11/26/2014 1156   BILITOT 0.9 12/16/2014 0425   BILITOT 0.90 11/26/2014 1156     Impression and Plan: Mr. Piedra is 81 year old gentleman with metastatic pancreatic cancer. He is on Xeloda and tolerating this well. His most recent CT scan showed stable disease.  He was hospitalized last week for a systemic inflammatory response. He was treated with antibiotics and 2 units of blood.  His blood cultures have come back positive for gram negative rods. We will treat him here with Levaquin today and tomorrow. He is still having abdominal discomfort.  We will see what the xray of his left wrist and hand shows.  We will see him back in 2 weeks for labs and follow-up.  He and the staff at [facility] know to contact us with any questions or concerns. We can certainly see him sooner if need be.   Eliezer Bottom,  NP 6/2/201612:14 PM

## 2014-12-21 ENCOUNTER — Non-Acute Institutional Stay (SKILLED_NURSING_FACILITY): Payer: Medicare Other | Admitting: Internal Medicine

## 2014-12-21 ENCOUNTER — Other Ambulatory Visit: Payer: Self-pay | Admitting: Family

## 2014-12-21 ENCOUNTER — Ambulatory Visit (HOSPITAL_BASED_OUTPATIENT_CLINIC_OR_DEPARTMENT_OTHER): Payer: Medicare Other

## 2014-12-21 ENCOUNTER — Other Ambulatory Visit: Payer: Self-pay

## 2014-12-21 ENCOUNTER — Encounter: Payer: Self-pay | Admitting: Internal Medicine

## 2014-12-21 VITALS — BP 124/50 | HR 71 | Temp 97.4°F | Resp 18

## 2014-12-21 DIAGNOSIS — L89152 Pressure ulcer of sacral region, stage 2: Secondary | ICD-10-CM

## 2014-12-21 DIAGNOSIS — R1010 Upper abdominal pain, unspecified: Secondary | ICD-10-CM | POA: Diagnosis not present

## 2014-12-21 DIAGNOSIS — G9340 Encephalopathy, unspecified: Secondary | ICD-10-CM | POA: Diagnosis not present

## 2014-12-21 DIAGNOSIS — E1142 Type 2 diabetes mellitus with diabetic polyneuropathy: Secondary | ICD-10-CM | POA: Diagnosis not present

## 2014-12-21 DIAGNOSIS — Z8739 Personal history of other diseases of the musculoskeletal system and connective tissue: Secondary | ICD-10-CM

## 2014-12-21 DIAGNOSIS — R609 Edema, unspecified: Secondary | ICD-10-CM | POA: Diagnosis not present

## 2014-12-21 DIAGNOSIS — C787 Secondary malignant neoplasm of liver and intrahepatic bile duct: Secondary | ICD-10-CM | POA: Diagnosis not present

## 2014-12-21 DIAGNOSIS — R6510 Systemic inflammatory response syndrome (SIRS) of non-infectious origin without acute organ dysfunction: Secondary | ICD-10-CM

## 2014-12-21 DIAGNOSIS — C259 Malignant neoplasm of pancreas, unspecified: Secondary | ICD-10-CM

## 2014-12-21 DIAGNOSIS — R627 Adult failure to thrive: Secondary | ICD-10-CM

## 2014-12-21 DIAGNOSIS — R7881 Bacteremia: Secondary | ICD-10-CM | POA: Diagnosis present

## 2014-12-21 DIAGNOSIS — L03031 Cellulitis of right toe: Secondary | ICD-10-CM

## 2014-12-21 DIAGNOSIS — I10 Essential (primary) hypertension: Secondary | ICD-10-CM | POA: Diagnosis not present

## 2014-12-21 DIAGNOSIS — D649 Anemia, unspecified: Secondary | ICD-10-CM | POA: Diagnosis not present

## 2014-12-21 DIAGNOSIS — A419 Sepsis, unspecified organism: Secondary | ICD-10-CM

## 2014-12-21 DIAGNOSIS — C801 Malignant (primary) neoplasm, unspecified: Secondary | ICD-10-CM

## 2014-12-21 MED ORDER — LEVOFLOXACIN IN D5W 750 MG/150ML IV SOLN
750.0000 mg | Freq: Once | INTRAVENOUS | Status: AC
  Administered 2014-12-21: 750 mg via INTRAVENOUS
  Filled 2014-12-21: qty 150

## 2014-12-21 MED ORDER — LEVOFLOXACIN 500 MG PO TABS: 500.0000 mg | ORAL_TABLET | Freq: Every day | ORAL | Status: DC

## 2014-12-21 MED ORDER — METHYLPREDNISOLONE 4 MG PO TBPK: ORAL_TABLET | ORAL | Status: AC

## 2014-12-21 NOTE — Progress Notes (Signed)
Patient ID: [name] [name], male   DOB: April 02, 1923, 81 y.o.   MRN: [id number]    HISTORY AND PHYSICAL  Location:  [facility] Room Number: 44 Place of Service: SNF (31)   Extended Emergency Contact Information Primary Emergency Contact: [name] [place] of [place] Phone: [phone] Relation: Other Secondary Emergency Contact: Godwin,Rose Address: [address]          [place], [place] [address] [name] of [place] Phone: [phone] Relation: Other  Advanced Directive information Does patient have an advance directive?: Yes, Type of Advance Directive: Healthcare Power of [place];Living will, Does patient want to make changes to advanced directive?: No - Patient declined  Chief Complaint  Patient presents with  . Readmit To SNF    Following hospitalization    HPI:  Readmitted 12/18/2014 following hospitalization from 12/14/2014 3 12/18/2014. Patient was treated with broad-spectrum antibiotics for SIRS. He had encephalopathy at that point he was treated, which now seems to be cleared.  Patient has known pancreatic cancer with metastatic disease to the liver. Xeloda is being held for the time being.  Diabetes mellitus is under reasonable control  Patient has chronic anemia and got 2 units of packed red blood cells while hospitalized.  Hypertension is controlled  Chronic systolic heart failure with cardiomegaly appears to be compensated.  Since return patient has had acute swelling of the left hand with severe pain. Radiology reports from 12/14/2014 suggest CPPD disease and soft tissue swelling. He was started on a steroid-induced pack yesterday. He also has narcotic level medications for pain relief.  Past Medical History  Diagnosis Date  . Diverticulosis of colon (without mention of hemorrhage)   . Irritable bowel syndrome   . Personal history of malignant neoplasm of prostate     s/p  radioactive seed implants 2000  .  Unspecified hereditary and idiopathic peripheral neuropathy   . Nonischemic cardiomyopathy cardiologist-  dr Aundra Dubin    EF 25%  per last echo 2012 and cardiologist note--  2007-  ef  40-45%;  2008- ef 35-40%;  2010- ef 30-35%;  2012- ef 25%  . Sinus bradycardia   . Hypertension   . Arthritis     hands  . Hyperlipidemia   . History of DVT of lower extremity     2002  ,   left leg  . History of adenomatous polyp of colon     2001  . History of basal cell carcinoma excision   . Anticoagulated on Coumadin   . Type 2 diabetes, diet controlled   . GERD (gastroesophageal reflux disease)   . LBBB (left bundle branch block)   . Wears glasses   . Wears hearing aid     BILATERAL  . Unsteady gait   . DDD (degenerative disc disease)     cervical and lumbar  . DJD (degenerative joint disease)     hips  . Lower urinary tract symptoms (LUTS)   . 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 Coronary artery disease     per cath 2002  non-obstructive cad  . Chronic systolic heart failure   . H/O fracture of nose     12-30-2013-  pt fell at home open nose bone fx  . Abdominal pain 12/20/2013  . Abnormal MRI of abdomen 01/31/2014  . Acute encephalopathy 07/02/2014  . Acute thromboembolism of deep veins of  lower extremity 05/03/2007    Qualifier: History of  By: Doy Mince LPN, Megan  37/85/88 Doppler LLE: DVT at left femoral and popliteal veins not completely occlusive. Will anticoagulate with Eliquis 2.65m bid.    . Anemia of chronic disease 02/20/2013  . CARDIOMYOPATHY 07/06/2007    Qualifier: Diagnosis of  By: NLenna GilfordMD, SDeborra Medina  . Constipation 04/04/2014  . Coronary atherosclerosis 07/06/2007    Qualifier: Diagnosis of  By: NLenna GilfordMD, SWoodburyDISEASE 05/03/2007    Qualifier: Diagnosis of  By: RDoy MinceLPN, Megan    . DVT (deep venous thrombosis)   . Edema 05/28/2014  . Failure to thrive in  adult 03/30/2014  . Fall in elderly patient 12/30/2013  . Fever 07/20/2014  . Frailty 03/30/2014  . FUO (fever of unknown origin) 05/28/2014    05/28/14 CXR right lower lobe bronchopneumonia.    .Marland KitchenGERD 05/03/2007    Qualifier: Diagnosis of  By: RDoy MinceLPN, Megan    . Hereditary and idiopathic peripheral neuropathy 07/06/2007    Qualifier: Diagnosis of  By: NLenna GilfordMD, SDeborra Medina  . HYPERLIPIDEMIA 05/03/2007    Qualifier: Diagnosis of  By: RDoy MinceLPN, Megan    . HYPERTENSION, BENIGN 06/23/2010    Qualifier: Diagnosis of  By: BOlevia Perches MD, FGlenetta Hew  . Insomnia 04/04/2014  . Liver metastasis 04/01/2014  . Malnutrition of moderate degree 03/30/2014    05/01/14 albumin 2.8 05/29/14 albumin 3.0   . Metastatic cancer 03/29/2014  . Normocytic anemia 03/30/2014    04/10/14 Hgb 9.3   . Pancreatic cancer 04/06/2014    06/06/14 Oncology: stage IV pancreatic cancer with Mets to liver: CA 19-9 400--194. CT showed more liver metastases. Xeloda 15044mbid x 14days on and 7 days off. 3 cycles of tx and then repeat the scans.    . Pressure ulcer stage II 08/15/2014    08/14/14 L scrotum small PU-not infected-tx initiated.    . Protein-calorie malnutrition, severe 07/05/2014  . SPINAL STENOSIS, LUMBAR 07/06/2007    Qualifier: Diagnosis of  By: NaLenna GilfordD, ScDeborra Medina . Streptococcal bacteremia 07/23/2014  . Transaminitis 03/30/2014    04/10/14 Alkaline phosphatase 126(39-117) 05/01/14 AST 33, ALT 45, alk  Phos 75 05/29/14 AST 64, ALT 70 alk phos 209   . Type 2 diabetes mellitus 05/03/2007    Qualifier: Diagnosis of  By: ReDoy MincePN, Megan  1150/27/74gb A1c 7.1    . Weakness generalized 04/02/2014  . H/O calcium pyrophosphate deposition disease (CPPD) 12/14/2014    Past Surgical History  Procedure Laterality Date  . Knee arthroscopy Left 1991  . Ercp with sphincterotomy and stone removal  05/ 1993  &  05-11-2006  . Total knee arthroplasty  10/05/2011    Procedure: TOTAL KNEE ARTHROPLASTY;  Surgeon: FrGearlean AlfMD;  Location: [facility];  Service: Orthopedics;  Laterality: Right;  . Transurethral resection of bladder tumor N/A 09/30/2012    Procedure: TRANSURETHRAL RESECTION OF BLADDER TUMOR (TURBT);  Surgeon: StFranchot GalloMD;  Location: [facility];  Service: Urology;  Laterality: N/A;  WITH MITOMYCIN INSTILLATION   . Eus N/A 01/04/2014    Procedure: ESOPHAGEAL ENDOSCOPIC ULTRASOUND (EUS) RADIAL;  Surgeon: DaMilus BanisterMD;  Location: [facility];  Service: Endoscopy;  Laterality: N/A;  . Radioactive prostate seed implants  01/ 2000  . Mohs surgery  03/ 2009    LEFT EAR BASAL CELL CARCINOMA  . Tonsillectomy  AS CHILD  . Cholecystectomy  open  1985  . Appendectomy  child  . Inguinal hernia repair Bilateral 1998    recurrent RIH  w/ repair 06-21-2006  . Total knee arthroplasty Left 02-21-2007    AND 04-21-2007  CLOSED MANIPULATION  . Cataract extraction w/ intraocular lens implant Right 2004  . Lumbar spine surgery  X2  . Cardiac catheterization  06-14-2001  dr Darnell Level brodie    non-obstructive CAD/  LM 30%,  pLAD 50%,  mLAD 30%,  RCA  &  CFX  irregularites ,  global hypokinesis,  ef 40%  . Cardiovascular stress test  02-07-2007   dr Darnell Level brodie    negative for ischemia,  low risk myoview perfusion study  . Transthoracic echocardiogram  01-28-2011    moderate LVH/  ef 25%/  diffuse hypokinesis/  mild LAE/  mild TR/  trivial AR & MR  . Cystoscopy w/ retrogrades Bilateral 03/09/2014    Procedure: CYSTOSCOPY WITH BILATERAL RETROGRADE PYELOGRAM BLADDER BX AND RENAL WASHINGS.;  Surgeon: Jorja Loa, MD;  Location: [facility];  Service: Urology;  Laterality: Bilateral;  . Eus N/A 04/05/2014    Procedure: UPPER ENDOSCOPIC ULTRASOUND (EUS) LINEAR;  Surgeon: [name], MD;  Location: [facility];  Service: Endoscopy;  Laterality: N/A;  radial linear  . Endoscopic retrograde cholangiopancreatography (ercp) with propofol N/A 04/12/2014    Procedure: ENDOSCOPIC RETROGRADE  CHOLANGIOPANCREATOGRAPHY (ERCP) WITH PROPOFOL;  Surgeon: [name], MD;  Location: [facility];  Service: Endoscopy;  Laterality: N/A;  metal stent  . Biliary stent placement N/A 04/12/2014    Procedure: BILIARY STENT PLACEMENT;  Surgeon: [name], MD;  Location: [facility];  Service: Endoscopy;  Laterality: N/A;    Patient Care Team: Doe-Hyun Kyra Searles, DO as PCP - General (Internal Medicine) Estill Dooms, MD as PCP - Internal Medicine (Internal Medicine) Franchot Gallo, MD as Consulting Physician (Urology) Volanda Napoleon, MD as Consulting Physician (Oncology) Gaynelle Arabian, MD as Consulting Physician (Orthopedic Surgery) Larey Dresser, MD as Consulting Physician (Cardiology) Gatha Mayer, MD as Consulting Physician (Gastroenterology)  History   Social History  . Marital Status: Widowed    Spouse Name: N/A  . Number of Children: 0  . Years of Education: N/A   Occupational History  .     Social History Main Topics  . Smoking status: Former Smoker -- 1.00 packs/day for 10 years    Types: Cigarettes    Start date: 04/10/1954    Quit date: 09/10/1982  . Smokeless tobacco: Never Used     Comment: quit 32 years ago  . Alcohol Use: No  . Drug Use: No  . Sexual Activity: Not on file   Other Topics Concern  . Not on file   Social History Narrative   Married, no children.      reports that he quit smoking about 32 years ago. His smoking use included Cigarettes. He started smoking about 60 years ago. He has a 10 pack-year smoking history. He has never used smokeless tobacco. He reports that he does not drink alcohol or use illicit drugs.  Family History  Problem Relation Age of Onset  . Throat cancer Brother   . Coronary artery disease Father     deceased   Family Status  Relation Status Death Age  . Brother      throat cancer  . Father Deceased     coronary artery disease    Immunization History  Administered Date(s) Administered  . Influenza  Split  06/20/2011, 04/22/2012, 05/13/2013  . Influenza Whole 05/07/2005, 05/05/2006, 06/04/2009, 08/21/2010  . Influenza,inj,Quad PF,36+ Mos 04/06/2014  . Influenza-Unspecified 05/23/2014  . PPD Test 04/03/2014    No Known Allergies  Medications: Patient's Medications  New Prescriptions   No medications on file  Previous Medications   ACETAMINOPHEN (TYLENOL) 325 MG TABLET    Take by mouth every 6 (six) hours as needed for fever (For fever greater than 101. Give one dose).   APIXABAN (ELIQUIS) 5 MG TABS TABLET    Take 1 tablet (5 mg total) by mouth 2 (two) times daily.   BISACODYL (DULCOLAX) 10 MG SUPPOSITORY    Place 1 suppository (10 mg total) rectally daily as needed for moderate constipation.   CAPECITABINE (XELODA) 500 MG TABLET    Take 3 tablets (1,500 mg total) by mouth 2 (two) times daily after a meal. TAKES 3 TABS (1500) MG BID  ///  ON 14 DAYS OFF 7 DAYS FOR 3 CYCLES.   STARTED 06/06/14   DOCUSATE SODIUM 100 MG CAPS    Take 100 mg by mouth 2 (two) times daily.   FEEDING SUPPLEMENT, GLUCERNA SHAKE, (GLUCERNA SHAKE) LIQD    Take 237 mLs by mouth 2 (two) times daily between meals as needed (If pt with <50% meal completion).   HYDROCODONE-ACETAMINOPHEN (NORCO) 10-325 MG PER TABLET    Take one tablet by mouth every 4 hours as needed for pain   INSULIN GLARGINE (LANTUS) 100 UNIT/ML INJECTION    Inject 3 Units into the skin at bedtime.   LACTULOSE (CHRONULAC) 10 GM/15ML SOLUTION    Take 20 g by mouth daily as needed for mild constipation (53m as needed for constipation).    LIDOCAINE-PRILOCAINE (EMLA) CREAM    Apply 1 application topically 6 (six) times daily. Apply topically every 4 hours as needed for pain   LORAZEPAM (ATIVAN) 0.5 MG TABLET    Take 2 tablets (1 mg total) by mouth every 6 (six) hours as needed (nausea/vomiting).   METHYLPREDNISOLONE (MEDROL DOSEPAK) 4 MG TBPK TABLET    Take as directed on package.   MIRTAZAPINE (REMERON) 7.5 MG TABLET    Take 7.5 mg by mouth at bedtime.     MORPHINE (ROXANOL) 20 MG/ML CONCENTRATED SOLUTION    Take 5 mg by mouth every 2 (two) hours as needed for severe pain (Give 0.231m5mg).   MULTIPLE VITAMIN (MULTIVITAMIN WITH MINERALS) TABS TABLET    Take 1 tablet by mouth daily.   MULTIPLE VITAMINS-MINERALS (PRESERVISION AREDS 2) CAPS    Take 1 capsule by mouth 2 (two) times daily.   NITROGLYCERIN (NITROSTAT) 0.4 MG SL TABLET    Place 1 tablet (0.4 mg total) under the tongue every 5 (five) minutes as needed for chest pain.   OMEGA-3 FATTY ACIDS (FISH OIL) 1000 MG CAPS    Take 1 capsule by mouth.   ONDANSETRON (ZOFRAN) 8 MG TABLET    Take by mouth 2 (two) times daily as needed for nausea or vomiting.    OXYCODONE (OXY IR/ROXICODONE) 5 MG IMMEDIATE RELEASE TABLET    Take one tablet by mouth twice daily for pain   POLYETHYLENE GLYCOL (MIRALAX / GLYCOLAX) PACKET    Take 17 g by mouth 2 (two) times daily.   POLYVINYL ALCOHOL (LIQUIFILM TEARS) 1.4 % OPHTHALMIC SOLUTION    Place 1 drop into both eyes 4 (four) times daily as needed for dry eyes.   PRAVASTATIN (PRAVACHOL) 40 MG TABLET    Take 40 mg by mouth every evening.  PREGABALIN (LYRICA) 75 MG CAPSULE    Take 75 mg by mouth daily.   PRESCRIPTION MEDICATION       PROCHLORPERAZINE (COMPAZINE) 5 MG TABLET    Take 5 mg by mouth every 6 (six) hours as needed for nausea or vomiting.   RANITIDINE (ZANTAC) 150 MG TABLET    Take 150 mg by mouth 2 (two) times daily.   TEMAZEPAM (RESTORIL) 15 MG CAPSULE    Take 15 mg by mouth at bedtime.   Modified Medications   No medications on file  Discontinued Medications   No medications on file    Review of Systems  Constitutional: Positive for fatigue. Negative for fever, chills, diaphoresis, activity change and appetite change.  HENT: Positive for hearing loss. Negative for congestion, ear discharge, ear pain, nosebleeds, sore throat and tinnitus.   Eyes: Negative for photophobia, pain, discharge and redness.  Respiratory: Negative for cough, shortness of  breath, wheezing and stridor.   Cardiovascular: Positive for leg swelling (1+ bilateral). Negative for chest pain and palpitations.  Gastrointestinal: Negative for nausea, vomiting, abdominal pain, diarrhea, constipation and blood in stool.  Endocrine: Negative for polydipsia.  Genitourinary: Positive for frequency. Negative for dysuria, urgency, hematuria and flank pain.  Musculoskeletal: Negative for myalgias, back pain and neck pain.       Swollen left wrist with exquisite tenderness and mild warmth.  Skin: Negative for rash.       right great toe nail came off R upper chest Port A Cath intact.    Allergic/Immunologic: Negative for environmental allergies.  Neurological: Negative for dizziness, tremors, seizures, weakness and headaches.  Hematological: Does not bruise/bleed easily.  Psychiatric/Behavioral: Negative for suicidal ideas and hallucinations. The patient is not nervous/anxious.     Filed Vitals:   12/21/14 1230  BP: 135/70  Pulse: 67  Temp: 99.2 F (37.3 C)  Resp: 16  Height: 5' 10"  (1.778 m)  Weight: 173 lb (78.472 kg)   Body mass index is 24.82 kg/(m^2).  Physical Exam  Constitutional: He is oriented to person, place, and time. He appears well-developed and well-nourished.  HENT:  Head: Normocephalic and atraumatic.  Mouth/Throat: Oropharynx is clear and moist.  Eyes: Conjunctivae and EOM are normal. Pupils are equal, round, and reactive to light. Right eye exhibits no discharge. Left eye exhibits no discharge. No scleral icterus.  Neck: Normal range of motion. Neck supple. No JVD present. No tracheal deviation present. No thyromegaly present.  Cardiovascular: Normal rate, regular rhythm and normal heart sounds.   Pulmonary/Chest: Effort normal and breath sounds normal. No stridor. He has no wheezes. He has no rales. He exhibits no tenderness.  Abdominal: Soft. Bowel sounds are normal. He exhibits no distension. There is no tenderness. There is no rebound and no  guarding.  Minimal epigastric tenderness.  No ascites  Musculoskeletal: Normal range of motion. He exhibits edema. He exhibits no tenderness.  LLE  Lymphadenopathy:    He has no cervical adenopathy.  Neurological: He is alert and oriented to person, place, and time. No cranial nerve deficit.  Skin: Skin is warm and dry. No rash noted. No erythema. No pallor.  Right great toe has an avulsed nail. No infection. Tender to touch. Sacral area has a stage II decubitus ulcer. There is erythema and slightly raw area.  Psychiatric: He has a normal mood and affect. His behavior is normal.     Labs reviewed: Admission on 12/14/2014, Discharged on 12/18/2014  Component Date Value Ref Range Status  . Glucose-Capillary  12/14/2014 138* 65 - 99 mg/dL Final  . Sodium 12/14/2014 138  135 - 145 mmol/L Final  . Potassium 12/14/2014 4.4  3.5 - 5.1 mmol/L Final  . Chloride 12/14/2014 100* 101 - 111 mmol/L Final  . CO2 12/14/2014 31  22 - 32 mmol/L Final  . Glucose, Bld 12/14/2014 148* 65 - 99 mg/dL Final  . BUN 12/14/2014 17  6 - 20 mg/dL Final  . Creatinine, Ser 12/14/2014 0.72  0.61 - 1.24 mg/dL Final  . Calcium 12/14/2014 8.6* 8.9 - 10.3 mg/dL Final  . Total Protein 12/14/2014 6.3* 6.5 - 8.1 g/dL Final  . Albumin 12/14/2014 2.9* 3.5 - 5.0 g/dL Final  . AST 12/14/2014 357* 15 - 41 U/L Final  . ALT 12/14/2014 79* 17 - 63 U/L Final  . Alkaline Phosphatase 12/14/2014 673* 38 - 126 U/L Final  . Total Bilirubin 12/14/2014 2.5* 0.3 - 1.2 mg/dL Final  . GFR calc non Af Amer 12/14/2014 >60  >60 mL/min Final  . GFR calc Af Amer 12/14/2014 >60  >60 mL/min Final   Comment: (NOTE) The eGFR has been calculated using the CKD EPI equation. This calculation has not been validated in all clinical situations. eGFR's persistently <60 mL/min signify possible Chronic Kidney Disease.   . Anion gap 12/14/2014 7  5 - 15 Final  . WBC 12/14/2014 2.5* 4.0 - 10.5 K/uL Final  . RBC 12/14/2014 5.13  4.22 - 5.81 MIL/uL Final   . Hemoglobin 12/14/2014 17.9* 13.0 - 17.0 g/dL Final  . HCT 12/14/2014 54.4* 39.0 - 52.0 % Final  . MCV 12/14/2014 106.0* 78.0 - 100.0 fL Final  . MCH 12/14/2014 34.9* 26.0 - 34.0 pg Final  . MCHC 12/14/2014 32.9  30.0 - 36.0 g/dL Final  . RDW 12/14/2014 20.2* 11.5 - 15.5 % Final  . Platelets 12/14/2014 88* 150 - 400 K/uL Final   Comment: REPEATED TO VERIFY SPECIMEN CHECKED FOR CLOTS PLATELET COUNT CONFIRMED BY SMEAR   . Color, Urine 12/14/2014 AMBER* YELLOW Final   BIOCHEMICALS MAY BE AFFECTED BY COLOR  . APPearance 12/14/2014 CLEAR  CLEAR Final  . Specific Gravity, Urine 12/14/2014 1.020  1.005 - 1.030 Final  . pH 12/14/2014 6.0  5.0 - 8.0 Final  . Glucose, UA 12/14/2014 NEGATIVE  NEGATIVE mg/dL Final  . Hgb urine dipstick 12/14/2014 NEGATIVE  NEGATIVE Final  . Bilirubin Urine 12/14/2014 NEGATIVE  NEGATIVE Final  . Ketones, ur 12/14/2014 NEGATIVE  NEGATIVE mg/dL Final  . Protein, ur 12/14/2014 NEGATIVE  NEGATIVE mg/dL Final  . Urobilinogen, UA 12/14/2014 1.0  0.0 - 1.0 mg/dL Final  . Nitrite 12/14/2014 NEGATIVE  NEGATIVE Final  . Leukocytes, UA 12/14/2014 NEGATIVE  NEGATIVE Final   MICROSCOPIC NOT DONE ON URINES WITH NEGATIVE PROTEIN, BLOOD, LEUKOCYTES, NITRITE, OR GLUCOSE <1000 mg/dL.  . Lactic Acid, Venous 12/14/2014 1.57  0.5 - 2.0 mmol/L Final  . Troponin i, poc 12/14/2014 0.01  0.00 - 0.08 ng/mL Final  . Comment 3 12/14/2014          Final   Comment: Due to the release kinetics of cTnI, a negative result within the first hours of the onset of symptoms does not rule out myocardial infarction with certainty. If myocardial infarction is still suspected, repeat the test at appropriate intervals.   . B Natriuretic Peptide 12/14/2014 483.1* 0.0 - 100.0 pg/mL Final  . Specimen Description 12/14/2014 BLOOD RFARM   Final  . Special Requests 12/14/2014 BOTTLES DRAWN AEROBIC AND ANAEROBIC 5CC   Final  . Culture  12/14/2014    Final                   Value:GRAM NEGATIVE RODS Note:  Gram Stain Report Called to,Read Back By and Verified With: Lucina Mellow RN Prosser 12/19/14@1559  BY PARDA Performed at [facility]   . Report Status 12/14/2014 PENDING   Incomplete  . Specimen Description 12/14/2014 BLOOD RAC   Final  . Special Requests 12/14/2014 BOTTLES DRAWN AEROBIC AND ANAEROBIC 5CC   Final  . Culture 12/14/2014    Final                   Value:NO GROWTH 5 DAYS Performed at [facility]   . Report Status 12/14/2014 12/20/2014 FINAL   Final  . Specimen Description 12/14/2014 URINE, CATHETERIZED   Final  . Special Requests 12/14/2014 Immunocompromised   Final  . Colony Count 12/14/2014    Final                   Value:NO GROWTH Performed at [facility]   . Culture 12/14/2014    Final                   Value:NO GROWTH Performed at [facility]   . Report Status 12/14/2014 12/15/2014 FINAL   Final  . Ammonia 12/14/2014 16  9 - 35 umol/L Final  . Glucose-Capillary 12/14/2014 143* 65 - 99 mg/dL Final  . MRSA by PCR 12/14/2014 NEGATIVE  NEGATIVE Final   Comment:        The GeneXpert MRSA Assay (FDA approved for NASAL specimens only), is one component of a comprehensive MRSA colonization surveillance program. It is not intended to diagnose MRSA infection nor to guide or monitor treatment for MRSA infections.   . WBC 12/15/2014 7.1  4.0 - 10.5 K/uL Final  . RBC 12/15/2014 2.42* 4.22 - 5.81 MIL/uL Final  . Hemoglobin 12/15/2014 8.4* 13.0 - 17.0 g/dL Final   Comment: DELTA CHECK NOTED REPEATED TO VERIFY   . HCT 12/15/2014 25.9* 39.0 - 52.0 % Final  . MCV 12/15/2014 107.0* 78.0 - 100.0 fL Final  . MCH 12/15/2014 34.7* 26.0 - 34.0 pg Final  . MCHC 12/15/2014 32.4  30.0 - 36.0 g/dL Final  . RDW 12/15/2014 20.6* 11.5 - 15.5 % Final  . Platelets 12/15/2014 212  150 - 400 K/uL Final   Comment: DELTA CHECK NOTED REPEATED TO VERIFY   . Sodium 12/15/2014 136  135 - 145 mmol/L Final  . Potassium 12/15/2014 4.3  3.5 - 5.1 mmol/L  Final  . Chloride 12/15/2014 103  101 - 111 mmol/L Final  . CO2 12/15/2014 27  22 - 32 mmol/L Final  . Glucose, Bld 12/15/2014 148* 65 - 99 mg/dL Final  . BUN 12/15/2014 14  6 - 20 mg/dL Final  . Creatinine, Ser 12/15/2014 0.73  0.61 - 1.24 mg/dL Final  . Calcium 12/15/2014 8.0* 8.9 - 10.3 mg/dL Final  . Total Protein 12/15/2014 6.0* 6.5 - 8.1 g/dL Final  . Albumin 12/15/2014 2.6* 3.5 - 5.0 g/dL Final  . AST 12/15/2014 171* 15 - 41 U/L Final  . ALT 12/15/2014 79* 17 - 63 U/L Final  . Alkaline Phosphatase 12/15/2014 510* 38 - 126 U/L Final  . Total Bilirubin 12/15/2014 4.4* 0.3 - 1.2 mg/dL Final  . GFR calc non Af Amer 12/15/2014 >60  >60 mL/min Final  . GFR calc Af Amer 12/15/2014 >60  >60 mL/min Final   Comment: (  NOTE) The eGFR has been calculated using the CKD EPI equation. This calculation has not been validated in all clinical situations. eGFR's persistently <60 mL/min signify possible Chronic Kidney Disease.   . Anion gap 12/15/2014 6  5 - 15 Final  . Prothrombin Time 12/15/2014 26.2* 11.6 - 15.2 seconds Final  . INR 12/15/2014 2.44* 0.00 - 1.49 Final  . Glucose-Capillary 12/15/2014 147* 65 - 99 mg/dL Final  . Glucose-Capillary 12/15/2014 155* 65 - 99 mg/dL Final  . Comment 1 12/15/2014 Notify RN   Final  . Comment 2 12/15/2014 Document in Chart   Final  . Glucose-Capillary 12/15/2014 200* 65 - 99 mg/dL Final  . WBC 12/16/2014 7.2  4.0 - 10.5 K/uL Final  . RBC 12/16/2014 2.40* 4.22 - 5.81 MIL/uL Final  . Hemoglobin 12/16/2014 8.4* 13.0 - 17.0 g/dL Final  . HCT 12/16/2014 25.8* 39.0 - 52.0 % Final  . MCV 12/16/2014 107.5* 78.0 - 100.0 fL Final  . MCH 12/16/2014 35.0* 26.0 - 34.0 pg Final  . MCHC 12/16/2014 32.6  30.0 - 36.0 g/dL Final  . RDW 12/16/2014 20.1* 11.5 - 15.5 % Final  . Platelets 12/16/2014 253  150 - 400 K/uL Final  . Sodium 12/16/2014 137  135 - 145 mmol/L Final  . Potassium 12/16/2014 4.3  3.5 - 5.1 mmol/L Final  . Chloride 12/16/2014 104  101 - 111 mmol/L  Final  . CO2 12/16/2014 26  22 - 32 mmol/L Final  . Glucose, Bld 12/16/2014 273* 65 - 99 mg/dL Final  . BUN 12/16/2014 22* 6 - 20 mg/dL Final  . Creatinine, Ser 12/16/2014 0.99  0.61 - 1.24 mg/dL Final  . Calcium 12/16/2014 8.0* 8.9 - 10.3 mg/dL Final  . Total Protein 12/16/2014 5.4* 6.5 - 8.1 g/dL Final  . Albumin 12/16/2014 2.2* 3.5 - 5.0 g/dL Final  . AST 12/16/2014 57* 15 - 41 U/L Final  . ALT 12/16/2014 44  17 - 63 U/L Final  . Alkaline Phosphatase 12/16/2014 342* 38 - 126 U/L Final  . Total Bilirubin 12/16/2014 0.9  0.3 - 1.2 mg/dL Final  . GFR calc non Af Amer 12/16/2014 >60  >60 mL/min Final  . GFR calc Af Amer 12/16/2014 >60  >60 mL/min Final   Comment: (NOTE) The eGFR has been calculated using the CKD EPI equation. This calculation has not been validated in all clinical situations. eGFR's persistently <60 mL/min signify possible Chronic Kidney Disease.   . Anion gap 12/16/2014 7  5 - 15 Final  . Glucose-Capillary 12/15/2014 209* 65 - 99 mg/dL Final  . Comment 1 12/15/2014 Notify RN   Final  . Comment 2 12/15/2014 Document in Chart   Final  . Glucose-Capillary 12/15/2014 240* 65 - 99 mg/dL Final  . Comment 1 12/15/2014 Notify RN   Final  . Comment 2 12/15/2014 Document in Chart   Final  . Glucose-Capillary 12/16/2014 216* 65 - 99 mg/dL Final  . Comment 1 12/16/2014 Notify RN   Final  . Comment 2 12/16/2014 Document in Chart   Final  . Glucose-Capillary 12/16/2014 220* 65 - 99 mg/dL Final  . Comment 1 12/16/2014 Notify RN   Final  . Comment 2 12/16/2014 Document in Chart   Final  . Glucose-Capillary 12/16/2014 237* 65 - 99 mg/dL Final  . Glucose-Capillary 12/16/2014 222* 65 - 99 mg/dL Final  . Glucose-Capillary 12/17/2014 170* 65 - 99 mg/dL Final  . ABO/RH(D) 12/17/2014 A POS   Final  . Antibody Screen 12/17/2014 NEG  Final  . Sample Expiration 12/17/2014 12/20/2014   Final  . Unit Number 12/17/2014 [id number]   Final  . Blood Component Type 12/17/2014 RED CELLS,LR    Final  . Unit division 12/17/2014 00   Final  . Status of Unit 12/17/2014 ISSUED,FINAL   Final  . Transfusion Status 12/17/2014 OK TO TRANSFUSE   Final  . Crossmatch Result 12/17/2014 Compatible   Final  . Unit Number 12/17/2014 [id number]   Final  . Blood Component Type 12/17/2014 RED CELLS,LR   Final  . Unit division 12/17/2014 00   Final  . Status of Unit 12/17/2014 ISSUED,FINAL   Final  . Transfusion Status 12/17/2014 OK TO TRANSFUSE   Final  . Crossmatch Result 12/17/2014 Compatible   Final  . Order Confirmation 12/17/2014 ORDER PROCESSED BY BLOOD BANK   Final  . Glucose-Capillary 12/17/2014 143* 65 - 99 mg/dL Final  . Glucose-Capillary 12/17/2014 166* 65 - 99 mg/dL Final  . Hemoglobin 12/17/2014 10.1* 13.0 - 17.0 g/dL Final  . HCT 12/17/2014 31.3* 39.0 - 52.0 % Final  . Glucose-Capillary 12/17/2014 166* 65 - 99 mg/dL Final  . Glucose-Capillary 12/18/2014 128* 65 - 99 mg/dL Final  . Glucose-Capillary 12/18/2014 175* 65 - 99 mg/dL Final  Nursing Home on 11/28/2014  Component Date Value Ref Range Status  . Hemoglobin 11/26/2014 9.9* 13.5 - 17.5 g/dL Final  . HCT 11/26/2014 30* 41 - 53 % Final  . Platelets 11/26/2014 219  150 - 399 K/L Final  Erroneous Encounter on 11/26/2014  Component Date Value Ref Range Status  . WBC 11/26/2014 4.9  4.0 - 10.0 10e3/uL Final  . RBC 11/26/2014 2.75* 4.20 - 5.70 10e6/uL Final  . HGB 11/26/2014 9.9* 13.0 - 17.1 g/dL Final  . HCT 11/26/2014 29.6* 38.7 - 49.9 % Final  . MCV 11/26/2014 108* 82 - 98 fL Final  . MCH 11/26/2014 36.0* 28.0 - 33.4 pg Final  . MCHC 11/26/2014 33.4  32.0 - 35.9 g/dL Final  . RDW 11/26/2014 18.4* 11.1 - 15.7 % Final  . Platelets 11/26/2014 219  145 - 400 10e3/uL Final  . NEUT# 11/26/2014 3.4  1.5 - 6.5 10e3/uL Final  . LYMPH# 11/26/2014 1.1  0.9 - 3.3 10e3/uL Final  . MONO# 11/26/2014 0.3  0.1 - 0.9 10e3/uL Final  . Eosinophils Absolute 11/26/2014 0.1  0.0 - 0.5 10e3/uL Final  . BASO# 11/26/2014 0.0  0.0 - 0.2  10e3/uL Final  . NEUT% 11/26/2014 68.6  40.0 - 80.0 % Final  . LYMPH% 11/26/2014 22.5  14.0 - 48.0 % Final  . MONO% 11/26/2014 6.7  0.0 - 13.0 % Final  . EOS% 11/26/2014 1.8  0.0 - 7.0 % Final  . BASO% 11/26/2014 0.4  0.0 - 2.0 % Final  . Sodium 11/26/2014 138  128 - 145 mEq/L Final  . Potassium 11/26/2014 3.3  3.3 - 4.7 mEq/L Final  . Chloride 11/26/2014 102  98 - 108 mEq/L Final  . CO2 11/26/2014 29  18 - 33 mEq/L Final  . Glucose, Bld 11/26/2014 111  73 - 118 mg/dL Final  . BUN, Bld 11/26/2014 12  7 - 22 mg/dL Final  . Creat 11/26/2014 0.9  0.6 - 1.2 mg/dl Final  . Total Bilirubin 11/26/2014 0.90  0.20 - 1.60 mg/dl Final  . Alkaline Phosphatase 11/26/2014 92* 26 - 84 U/L Final  . AST 11/26/2014 23  11 - 38 U/L Final  . ALT(SGPT) 11/26/2014 12  10 - 47 U/L Final  . Total Protein  11/26/2014 6.9  6.4 - 8.1 g/dL Final  . Albumin 11/26/2014 3.3  3.3 - 5.5 g/dL Final  . Calcium 11/26/2014 9.1  8.0 - 10.3 mg/dL Final  . CA 19-9 11/26/2014 31.6  <35.0 U/mL Final  Appointment on 11/05/2014  Component Date Value Ref Range Status  . WBC 11/05/2014 6.3  4.0 - 10.0 10e3/uL Final  . RBC 11/05/2014 2.62* 4.20 - 5.70 10e6/uL Final  . HGB 11/05/2014 9.4* 13.0 - 17.1 g/dL Final  . HCT 11/05/2014 28.4* 38.7 - 49.9 % Final  . MCV 11/05/2014 108* 82 - 98 fL Final  . MCH 11/05/2014 35.9* 28.0 - 33.4 pg Final  . MCHC 11/05/2014 33.1  32.0 - 35.9 g/dL Final  . RDW 11/05/2014 17.8* 11.1 - 15.7 % Final  . Platelets 11/05/2014 227  145 - 400 10e3/uL Final  . NEUT# 11/05/2014 4.4  1.5 - 6.5 10e3/uL Final  . LYMPH# 11/05/2014 1.3  0.9 - 3.3 10e3/uL Final  . MONO# 11/05/2014 0.5  0.1 - 0.9 10e3/uL Final  . Eosinophils Absolute 11/05/2014 0.1  0.0 - 0.5 10e3/uL Final  . BASO# 11/05/2014 0.0  0.0 - 0.2 10e3/uL Final  . NEUT% 11/05/2014 69.8  40.0 - 80.0 % Final  . LYMPH% 11/05/2014 21.0  14.0 - 48.0 % Final  . MONO% 11/05/2014 7.3  0.0 - 13.0 % Final  . EOS% 11/05/2014 1.4  0.0 - 7.0 % Final  . BASO%  11/05/2014 0.5  0.0 - 2.0 % Final  . Sodium 11/05/2014 140  135 - 145 mEq/L Final  . Potassium 11/05/2014 3.9  3.5 - 5.3 mEq/L Final  . Chloride 11/05/2014 105  96 - 112 mEq/L Final  . CO2 11/05/2014 26  19 - 32 mEq/L Final  . Glucose, Bld 11/05/2014 176* 70 - 99 mg/dL Final  . BUN 11/05/2014 15  6 - 23 mg/dL Final  . Creatinine, Ser 11/05/2014 0.90  0.50 - 1.35 mg/dL Final  . Total Bilirubin 11/05/2014 0.8  0.2 - 1.2 mg/dL Final  . Alkaline Phosphatase 11/05/2014 94  39 - 117 U/L Final  . AST 11/05/2014 18  0 - 37 U/L Final  . ALT 11/05/2014 8  0 - 53 U/L Final  . Total Protein 11/05/2014 5.8* 6.0 - 8.3 g/dL Final  . Albumin 11/05/2014 3.1* 3.5 - 5.2 g/dL Final  . Calcium 11/05/2014 8.5  8.4 - 10.5 mg/dL Final  . CA 19-9 11/05/2014 31.9  <35.0 U/mL Final  Nursing Home on 10/31/2014  Component Date Value Ref Range Status  . Hemoglobin 11/05/2014 9.4* 13.5 - 17.5 g/dL Final  . HCT 11/05/2014 28* 41 - 53 % Final  . Platelets 11/05/2014 18* 150 - 399 K/L Final  Appointment on 10/08/2014  Component Date Value Ref Range Status  . WBC 10/08/2014 4.8  4.0 - 10.0 10e3/uL Final  . RBC 10/08/2014 2.65* 4.20 - 5.70 10e6/uL Final  . HGB 10/08/2014 9.5* 13.0 - 17.1 g/dL Final  . HCT 10/08/2014 28.6* 38.7 - 49.9 % Final  . MCV 10/08/2014 108* 82 - 98 fL Final  . MCH 10/08/2014 35.8* 28.0 - 33.4 pg Final  . MCHC 10/08/2014 33.2  32.0 - 35.9 g/dL Final  . RDW 10/08/2014 16.6* 11.1 - 15.7 % Final  . Platelets 10/08/2014 193  145 - 400 10e3/uL Final  . NEUT# 10/08/2014 2.9  1.5 - 6.5 10e3/uL Final  . LYMPH# 10/08/2014 1.3  0.9 - 3.3 10e3/uL Final  . MONO# 10/08/2014 0.4  0.1 - 0.9 10e3/uL  Final  . Eosinophils Absolute 10/08/2014 0.1  0.0 - 0.5 10e3/uL Final  . BASO# 10/08/2014 0.0  0.0 - 0.2 10e3/uL Final  . NEUT% 10/08/2014 61.5  40.0 - 80.0 % Final  . LYMPH% 10/08/2014 27.0  14.0 - 48.0 % Final  . MONO% 10/08/2014 8.4  0.0 - 13.0 % Final  . EOS% 10/08/2014 2.7  0.0 - 7.0 % Final  . BASO%  10/08/2014 0.4  0.0 - 2.0 % Final  . CA 19-9 10/08/2014 43.0* <35.0 U/mL Final  . Sodium 10/08/2014 141  128 - 145 mEq/L Final  . Potassium 10/08/2014 4.6  3.3 - 4.7 mEq/L Final  . Chloride 10/08/2014 100  98 - 108 mEq/L Final  . CO2 10/08/2014 29  18 - 33 mEq/L Final  . Glucose, Bld 10/08/2014 117  73 - 118 mg/dL Final  . BUN, Bld 10/08/2014 22  7 - 22 mg/dL Final  . Creat 10/08/2014 1.0  0.6 - 1.2 mg/dl Final  . Total Bilirubin 10/08/2014 0.70  0.20 - 1.60 mg/dl Final  . Alkaline Phosphatase 10/08/2014 124* 26 - 84 U/L Final  . AST 10/08/2014 21  11 - 38 U/L Final  . ALT(SGPT) 10/08/2014 11  10 - 47 U/L Final  . Total Protein 10/08/2014 7.0  6.4 - 8.1 g/dL Final  . Albumin 10/08/2014 3.1* 3.3 - 5.5 g/dL Final  . Calcium 10/08/2014 9.5  8.0 - 10.3 mg/dL Final  Nursing Home on 10/01/2014  Component Date Value Ref Range Status  . Hemoglobin 09/29/2014 9.6* 13.5 - 17.5 g/dL Final  . HCT 09/29/2014 27* 41 - 53 % Final  . Platelets 09/29/2014 18* 150 - 399 K/L Final  . WBC 09/29/2014 6.1   Final  . Glucose 09/29/2014 114   Final  . BUN 09/29/2014 19  4 - 21 mg/dL Final  . Creatinine 09/29/2014 0.9  0.6 - 1.3 mg/dL Final  . Potassium 09/29/2014 3.8  3.4 - 5.3 mmol/L Final  . Sodium 09/29/2014 137  137 - 147 mmol/L Final  . Alkaline Phosphatase 09/29/2014 141* 25 - 125 U/L Final  . ALT 09/29/2014 8* 10 - 40 U/L Final  . AST 09/29/2014 12* 14 - 40 U/L Final  . Bilirubin, Total 09/29/2014 1.1   Final    Dg Wrist 2 Views Left  12/20/2014   CLINICAL DATA:  Fall 2 weeks ago with posterior wrist pain, subsequent encounter  EXAM: LEFT WRIST - 2 VIEW  COMPARISON:  12/14/2014  FINDINGS: Degenerative changes are noted particularly at the first [facility] joint. Diffuse chondrocalcinosis is noted. No acute fracture is seen. No gross soft tissue abnormality is noted.  IMPRESSION: Degenerative change without acute abnormality.   Electronically Signed   By: Inez Catalina M.D.   On: 12/20/2014 14:43   Dg  Wrist Complete Left  12/14/2014   CLINICAL DATA:  Altered mental status, 2 falls in the last 2 weeks, left wrist swelling  EXAM: LEFT WRIST - COMPLETE 3+ VIEW  COMPARISON:  None.  FINDINGS: Four views of the left wrist submitted. No acute fracture or subluxation. There is diffuse osteopenia. Narrowing of radiocarpal joint space. Chondrocalcinosis is noted. Degenerative changes first carpometacarpal joint. Degenerative changes anterior aspect of navicular.  IMPRESSION: No acute fracture or subluxation. Chondrocalcinosis. Degenerative changes as described above. Diffuse osteopenia.   Electronically Signed   By: Lahoma Crocker M.D.   On: 12/14/2014 16:32   Ct Head Wo Contrast  12/14/2014   CLINICAL DATA:  Confusion, fever abnormal liver function.  History of pancreatic carcinoma.  EXAM: CT HEAD WITHOUT CONTRAST  TECHNIQUE: Contiguous axial images were obtained from the base of the skull through the vertex without intravenous contrast.  COMPARISON:  12/30/2013  FINDINGS: Stable cortical atrophy and periventricular white matter small vessel ischemic changes. The brain demonstrates no evidence of hemorrhage, infarction, edema, mass effect, extra-axial fluid collection, hydrocephalus or mass lesion. The skull is unremarkable.  IMPRESSION: No acute findings.  Stable atrophy and small vessel disease.   Electronically Signed   By: Aletta Edouard M.D.   On: 12/14/2014 16:53   Ct Abdomen Pelvis W Contrast  12/14/2014   CLINICAL DATA:  History of pancreas cancer with worsening liver function  EXAM: CT ABDOMEN AND PELVIS WITH CONTRAST  TECHNIQUE: Multidetector CT imaging of the abdomen and pelvis was performed using the standard protocol following bolus administration of intravenous contrast.  CONTRAST:  162m OMNIPAQUE IOHEXOL 300 MG/ML  SOLN  COMPARISON:  11/26/2014  FINDINGS: Lower chest: Small bilateral pleural effusions are increased in volume from previous exam. There is overlying compressive type atelectasis and  consolidation.  Hepatobiliary: Moderate intrahepatic biliary ductal dilatation with pneumobilia is again identified and appears similar to the previous exam. Stable low-attenuation lesions in segment 3 and segment 6 of the liver compatible with small cysts. At this time there there is a subtle, low-attenuation structure within segment 7 of the liver which measures 1.2 cm, image 7/series 2. This is similar to previous examination in may represent an area of metastatic disease. Other previously demonstrated areas of liver metastasis are not well seen on the current exam. The common bile duct stent appears similarly pleasant shins than on the previous exam. On the current exam the common bile duct measures 2.8 cm, image 40/ series 4. Previously 2.1 cm  Pancreas: No discrete pancreas mass identified. Fatty replacement of the pancreatic parenchyma is identified.  Spleen: The spleen measures 14 cm in length, image 57 of series 4. Previously 11 cm. No focal splenic abnormality noted.  Adrenals/Urinary Tract: The adrenal glands are both normal. Unremarkable appearance of the kidneys.  Stomach/Bowel: The stomach is normal. The small bowel loops have a normal course and caliber. No evidence for obstruction. Normal appearance of the colon stress set normal appearance of the proximal colon. There are multiple sigmoid diverticula without acute inflammation.  Vascular/Lymphatic: Calcified atherosclerotic disease involves the abdominal aorta. No aneurysm. No enlarged retroperitoneal or mesenteric adenopathy. No enlarged pelvic or inguinal lymph nodes.  Reproductive: Brachytherapy implants throughout the prostate gland noted. Seminal vesicles are atrophic.  Other: Small amount of ascites is identified within the abdomen and pelvis. New from previous exam.  Musculoskeletal: Scattered well defined sclerotic foci are identified within the osseous structures and are favored to represent small bone islands. No aggressive lytic or  sclerotic bone lesions identified.  IMPRESSION: 1. With respect to the patient's pancreatic malignancy findings on today's are exam are not significantly changed compared with previous study. There is a lesion within the dome of liver which is unchanged from 09/17/2014 in may represent an area of liver metastasis. This was not well seen on study from 11/26/2014. 2. Biliary dilatation and pneumobilia. The degree of intrahepatic bile duct dilatation is not significantly changed. There has been mild increase caliber of the common bile duct. 3. New splenomegaly and ascites. Findings may reflect changes of portal venous hypertension secondary to patient's worsening liver function. 4. Increase in volume of bilateral pleural effusions.   Electronically Signed   By: TQueen SloughD.  On: 12/14/2014 17:02   Ct Abdomen Pelvis W Contrast  11/26/2014   CLINICAL DATA:  81 year old male with history of metastatic pancreatic cancer, completed oral chemotherapy yesterday. Followup examination.  EXAM: CT ABDOMEN AND PELVIS WITH CONTRAST  TECHNIQUE: Multidetector CT imaging of the abdomen and pelvis was performed using the standard protocol following bolus administration of intravenous contrast.  CONTRAST:  131m OMNIPAQUE IOHEXOL 300 MG/ML  SOLN  COMPARISON:  CT of the abdomen and pelvis 09/17/2014.  FINDINGS: Lower chest: Small left and trace right pleural effusions layering dependently. Cardiomegaly with left ventricular dilatation.  Hepatobiliary: Well-defined low-attenuation lesions in segment 3 and segment 6 are unchanged compared to prior examinations, compatible with small cysts. Previously noted ill-defined metastatic lesions are not confidently identified on today's examination, and no new lesions are noted. Subtle area of hypervascularity adjacent to the gallbladder fossa, favored to reflect a perfusion anomaly. Status post cholecystectomy. Intrahepatic biliary ductal dilatation with large volume of pneumobilia,  particularly in the left lobe of the liver, similar to the prior examination. Biliary stent in the common bile duct appears similarly positioned.  Pancreas: No discrete pancreatic mass identified. Pancreatic atrophy. No pancreatic or peripancreatic fluid or inflammatory changes.  Spleen: Unremarkable.  Adrenals/Urinary Tract: Bilateral adrenal glands and bilateral kidneys are normal in appearance. No hydroureteronephrosis. Urinary bladder is normal in appearance.  Stomach/Bowel: Normal appearance of the stomach. No pathologic dilatation of small bowel or colon. Persistent mass-like asymmetric thickening of the distal rectum shortly above the anorectal junction appears similar to the prior examination. Numerous colonic diverticulae are noted, particularly in the sigmoid colon, without surrounding inflammatory changes to suggest an acute diverticulitis at this time.  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 No lymphadenopathy noted in the abdomen or pelvis.  Reproductive: Brachytherapy implants throughout the prostate gland. Seminal vesicles are atrophic.  Other: No significant volume of ascites.  No pneumoperitoneum.  Musculoskeletal: Unchanged 12 mm sclerotic lesion in the right ilium just lateral to the sacroiliac joint compared to prior study 12/06/2013, presumably a bone island or potentially a treated metastatic lesion. There are no other aggressive appearing lytic or blastic lesions noted in the visualized portions of the skeleton.  IMPRESSION: 1. With respect to the patient's pancreatic malignancy, findings on today's examination are essentially stable compared to the prior study, with no definite new metastatic lesions in the liver, and lack of visualization of the previously treated metastatic lesions in  the liver. 2. Small area of hypervascularity adjacent to the gallbladder fossa in the liver is favored to represent a perfusion anomaly, particularly given that the primary pancreatic lesion was not hypervascular. Attention on followup studies is recommended. 3. Persistent mass-like thickening of the distal rectum immediately above the anorectal junction. This is concerning for potential neoplasm, and correlation with physical examination and/or sigmoidoscopy is recommended. 4. Extensive colonic diverticulosis without findings to suggest acute diverticulitis at this time. 5. Patent stent in the common bile duct with large amount of pneumobilia redemonstrated. 6. Trace right and small left pleural effusions layering dependently, new compared to the prior examination. 7. Cardiomegaly with left ventricular dilatation. 8. Additional findings, as above, similar to prior examinations.   Electronically Signed   By: DVinnie LangtonM.D.   On: 11/26/2014 12:00   Dg Chest Port 1 View  12/14/2014   CLINICAL DATA:  Shortness of breath.  EXAM: PORTABLE CHEST - 1 VIEW  COMPARISON:  July 19, 2014.  FINDINGS: Stable cardiomediastinal silhouette. No pneumothorax or pleural effusion is noted. Stable right internal jugular Port-A-Cath is noted with distal tip overlying expected position of SVC. Increased central pulmonary vascular congestion is noted as well as bilateral interstitial densities concerning for pulmonary edema. Bony thorax is intact.  IMPRESSION: Increased central pulmonary vascular congestion is noted with probable bilateral pulmonary edema.   Electronically Signed   By: Marijo Conception, M.D.   On: 12/14/2014 14:38   Dg Hand Complete Left  12/20/2014   CLINICAL DATA:  Status post fall 2 weeks ago. Left wrist and hand pain. Initial encounter.  EXAM: LEFT HAND - COMPLETE 3+ VIEW  COMPARISON:  None.  FINDINGS: Soft tissues of the left wrist and hand appear swollen. No fracture is identified. Chondrocalcinosis  about the wrist is noted. The patient has joint space narrowing and osteophytosis about the second and third MCP joints. Marked degenerative change is seen about the first [facility] joint.  IMPRESSION: Soft tissue swelling without underlying acute bony or joint abnormality.  Scattered osteoarthritis.  Degenerative change of the second and third MCP joints and TFC chondrocalcinosis are compatible with CPPD.   Electronically Signed   By: Inge Rise M.D.   On: 12/20/2014 14:43     Assessment/Plan  1. H/O calcium pyrophosphate deposition disease (CPPD) Acute problem. Exquisitely tender left wrist. Now on Medrol Dosepak, Vicodin, and oxycodone. -Add Naprosyn 220 mg twice daily.  2. Cellulitis of great toe of right foot Previously treated with antibody. Infection has resolved, but tenderness persists in the area of the avulsed toenail.  3. SIRS (systemic inflammatory response syndrome) Resolved  4. Malignant neoplasm of pancreas, unspecified location of malignancy May be responsible for low-grade fevers from time to time. Also patient is uncomfortable in epigastric area with palpation, although no masses felt.  5. Normocytic anemia -Follow-up with CBC and retake count  6. Liver metastasis -CMP  7. HYPERTENSION, BENIGN Controlled  8. Failure to thrive in adult Multifactorial but related most certainly to his cancer of the pancreas with liver metastases.  9. Edema 1+ bipedal and stable  10. Pain of upper abdomen Tender in epigastrium to palpation. Likely related to known cancer.  11. Type 2 diabetes mellitus with diabetic polyneuropathy Controlled  12. Acute encephalopathy Resolved  13. Decubitus ulcer of sacral region, stage 2 Continue to have pressure relieved by rolling in bed and trying to avoid episodes of prolonged sitting up and leaning back on the decubitus.

## 2014-12-21 NOTE — Patient Instructions (Signed)
Levofloxacin injection What is this medicine? LEVOFLOXACIN (lee voe FLOX a sin) is a quinolone antibiotic. It is used to treat certain kinds of bacterial infections. It will not work for colds, flu, or other viral infections. This medicine may be used for other purposes; ask your health care provider or pharmacist if you have questions. COMMON BRAND NAME(S): Levaquin What should I tell my health care provider before I take this medicine? They need to know if you have any of these conditions: -bone problems -cerebral disease -history of low levels of potassium in the blood -irregular heartbeat -joint problems -kidney disease -myasthenia gravis -seizure disorder -tendon problems -an unusual or allergic reaction to levofloxacin, other quinolone antibiotics, foods, dyes, or preservatives -pregnant or trying to get pregnant -breast-feeding How should I use this medicine? This medicine is for infusion into a vein. It is usually given by a health care professional in a hospital or clinic setting. If you get this medicine at home, you will be taught how to prepare and give this medicine. Use exactly as directed. Take your medicine at regular intervals. Do not take your medicine more often than directed. It is important that you put your used needles and syringes in a special sharps container. Do not put them in a trash can. If you do not have a sharps container, call your pharmacist or healthcare provider to get one. A special MedGuide will be given to you by the pharmacist with each prescription and refill. Be sure to read this information carefully each time. Talk to your pediatrician regarding the use of this medicine in children. While this drug may be prescribed for children as young as 6 months for selected conditions, precautions do apply. Overdosage: If you think you have taken too much of this medicine contact a poison control center or emergency room at once. NOTE: This medicine is only  for you. Do not share this medicine with others. What if I miss a dose? If you miss a dose, use it as soon as you can. If it is almost time for your next dose, use only that dose. Do not use double or extra doses. What may interact with this medicine? Do not take this medicine with any of the following medications - arsenic trioxide - chloroquine - droperidol - medications for irregular rhythm like amiodarone, disopyramide, dofetilide, flecainide, quinidine, procainamide, sotalol - some medicines for depression or mental problems like phenothiazines, pimozide, and ziprasidone This medicine may also interact with the following medications - amoxapine - birth control pills - cisapride - haloperidol -NSAIDS, medicines for pain and inflammation, like ibuprofen or naproxen - retinoid products like tretinoin or isotretinoin - risperidone - some other antibiotics like clarithromycin or erythromycin - theophylline - warfarin This list may not describe all possible interactions. Give your health care provider a list of all the medicines, herbs, non-prescription drugs, or dietary supplements you use. Also tell them if you smoke, drink alcohol, or use illegal drugs. Some items may interact with your medicine. What should I watch for while using this medicine? Tell your doctor or health care professional if your symptoms do not begin to improve in a few days. Drink several glasses of water a day and cut down on drinks that contain caffeine. You must not get dehydrated while taking this medicine. You may get drowsy or dizzy. Do not drive, use machinery, or do anything that needs mental alertness until you know how this medicine affects you. Do not sit or stand up quickly, especially   if you are an older patient. This reduces the risk of dizzy or fainting spells. This medicine can make you more sensitive to the sun. Keep out of the sun. If you cannot avoid being in the sun, wear protective  clothing and use a sunscreen. Do not use sun lamps or tanning beds/booths. Contact your doctor if you get a sunburn. If you are a diabetic monitor your blood glucose carefully. If you get an unusual reading stop taking this medicine and call your doctor right away. Do not treat diarrhea with over-the-counter products. Contact your doctor if you have diarrhea that lasts more than 2 days or if the diarrhea is severe and watery. What side effects may I notice from receiving this medicine? Side effects that you should report to your doctor or health care professional as soon as possible: -allergic reactions like skin rash or hives, swelling of the face, lips, or tongue -changes in vision -confusion, nightmares or hallucinations -difficulty breathing -irregular heartbeat, chest pain -joint, muscle or tendon pain -pain or difficulty passing urine -persistent headache with or without blurred vision -redness, blistering, peeling or loosening of the skin, including inside the mouth< -seizures -unusual pain, numbness, tingling, or weakness -vaginal irritation, discharge Side effects that usually do not require medical attention (report to your doctor or health care professional if they continue or are bothersome): -diarrhea -dry mouth -headache -pain, irritation at the site of injection -stomach upset, nausea -trouble sleeping This list may not describe all possible side effects. Call your doctor for medical advice about side effects. You may report side effects to FDA at 1-800-FDA-1088. Where should I keep my medicine? Keep out of the reach of children. If you are using this medicine at home, you will be instructed on how to store this medicine. Throw away any unused medicine after the expiration date on the label. NOTE: This sheet is a summary. It may not cover all possible information. If you have questions about this medicine, talk to your doctor, pharmacist, or health care provider.  2015,  [employer]. (2013-03-09 10:54:33)

## 2014-12-23 LAB — CULTURE, BLOOD (ROUTINE X 2)

## 2014-12-25 LAB — CBC AND DIFFERENTIAL
HCT: 31 % — AB (ref 41–53)
Hemoglobin: 10.3 g/dL — AB (ref 13.5–17.5)
PLATELETS: 191 10*3/uL (ref 150–399)
WBC: 4.8 10*3/mL

## 2014-12-25 LAB — HEPATIC FUNCTION PANEL
ALK PHOS: 210 U/L — AB (ref 25–125)
ALT: 13 U/L (ref 10–40)
AST: 12 U/L — AB (ref 14–40)
BILIRUBIN, TOTAL: 0.5 mg/dL

## 2014-12-25 LAB — BASIC METABOLIC PANEL
BUN: 25 mg/dL — AB (ref 4–21)
Creatinine: 0.9 mg/dL (ref 0.6–1.3)
Glucose: 169 mg/dL
Potassium: 4.6 mmol/L (ref 3.4–5.3)
Sodium: 136 mmol/L — AB (ref 137–147)

## 2014-12-26 ENCOUNTER — Other Ambulatory Visit: Payer: Self-pay | Admitting: Nurse Practitioner

## 2014-12-26 DIAGNOSIS — D649 Anemia, unspecified: Secondary | ICD-10-CM

## 2014-12-26 DIAGNOSIS — R7401 Elevation of levels of liver transaminase levels: Secondary | ICD-10-CM

## 2014-12-26 DIAGNOSIS — E43 Unspecified severe protein-calorie malnutrition: Secondary | ICD-10-CM

## 2014-12-26 DIAGNOSIS — R740 Nonspecific elevation of levels of transaminase and lactic acid dehydrogenase [LDH]: Secondary | ICD-10-CM

## 2014-12-26 DIAGNOSIS — D638 Anemia in other chronic diseases classified elsewhere: Secondary | ICD-10-CM

## 2014-12-26 DIAGNOSIS — E440 Moderate protein-calorie malnutrition: Secondary | ICD-10-CM

## 2014-12-28 ENCOUNTER — Encounter: Payer: Self-pay | Admitting: Nurse Practitioner

## 2014-12-28 DIAGNOSIS — M79642 Pain in left hand: Secondary | ICD-10-CM | POA: Insufficient documentation

## 2015-01-03 ENCOUNTER — Encounter: Payer: Self-pay | Admitting: *Deleted

## 2015-01-04 ENCOUNTER — Telehealth: Payer: Self-pay | Admitting: Hematology & Oncology

## 2015-01-04 NOTE — Telephone Encounter (Signed)
Clio 60 visits for 1 year.   Efc: 12/26/2014 - 12/26/2015   Please do not charge pt a co-pay   P: [phone]     COPY SCANNED

## 2015-01-04 NOTE — Telephone Encounter (Signed)
[name] [name] 60 visits for 1 year.   Efc: 12/26/2014 - 12/26/2015 Auth: [id number]  Please do not charge pt a co-pay   P: [phone]     COPY SCANNED

## 2015-01-08 ENCOUNTER — Other Ambulatory Visit (HOSPITAL_BASED_OUTPATIENT_CLINIC_OR_DEPARTMENT_OTHER): Payer: Medicare Other

## 2015-01-08 ENCOUNTER — Ambulatory Visit (HOSPITAL_BASED_OUTPATIENT_CLINIC_OR_DEPARTMENT_OTHER): Payer: Medicare Other

## 2015-01-08 ENCOUNTER — Ambulatory Visit (HOSPITAL_BASED_OUTPATIENT_CLINIC_OR_DEPARTMENT_OTHER): Payer: Medicare Other | Admitting: Hematology & Oncology

## 2015-01-08 ENCOUNTER — Encounter: Payer: Self-pay | Admitting: Hematology & Oncology

## 2015-01-08 VITALS — BP 111/79 | HR 111 | Temp 99.9°F | Resp 18 | Ht 70.0 in | Wt 153.0 lb

## 2015-01-08 DIAGNOSIS — C250 Malignant neoplasm of head of pancreas: Secondary | ICD-10-CM | POA: Diagnosis present

## 2015-01-08 DIAGNOSIS — C787 Secondary malignant neoplasm of liver and intrahepatic bile duct: Secondary | ICD-10-CM

## 2015-01-08 DIAGNOSIS — C259 Malignant neoplasm of pancreas, unspecified: Secondary | ICD-10-CM

## 2015-01-08 DIAGNOSIS — C251 Malignant neoplasm of body of pancreas: Secondary | ICD-10-CM

## 2015-01-08 DIAGNOSIS — I82402 Acute embolism and thrombosis of unspecified deep veins of left lower extremity: Secondary | ICD-10-CM

## 2015-01-08 DIAGNOSIS — E860 Dehydration: Secondary | ICD-10-CM | POA: Diagnosis present

## 2015-01-08 LAB — CBC WITH DIFFERENTIAL (CANCER CENTER ONLY)
BASO#: 0 10*3/uL (ref 0.0–0.2)
BASO%: 0.4 % (ref 0.0–2.0)
EOS ABS: 0.1 10*3/uL (ref 0.0–0.5)
EOS%: 0.8 % (ref 0.0–7.0)
HCT: 35.1 % — ABNORMAL LOW (ref 38.7–49.9)
HEMOGLOBIN: 11.9 g/dL — AB (ref 13.0–17.1)
LYMPH#: 2.4 10*3/uL (ref 0.9–3.3)
LYMPH%: 29.1 % (ref 14.0–48.0)
MCH: 33.6 pg — AB (ref 28.0–33.4)
MCHC: 33.9 g/dL (ref 32.0–35.9)
MCV: 99 fL — AB (ref 82–98)
MONO#: 0.6 10*3/uL (ref 0.1–0.9)
MONO%: 7.5 % (ref 0.0–13.0)
NEUT#: 5.1 10*3/uL (ref 1.5–6.5)
NEUT%: 62.2 % (ref 40.0–80.0)
PLATELETS: 340 10*3/uL (ref 145–400)
RBC: 3.54 10*6/uL — AB (ref 4.20–5.70)
RDW: 22 % — ABNORMAL HIGH (ref 11.1–15.7)
WBC: 8.3 10*3/uL (ref 4.0–10.0)

## 2015-01-08 LAB — COMPREHENSIVE METABOLIC PANEL (CC13)
ALK PHOS: 174 U/L — AB (ref 40–150)
ALT: 10 U/L (ref 0–55)
AST: 14 U/L (ref 5–34)
Albumin: 2.9 g/dL — ABNORMAL LOW (ref 3.5–5.0)
Anion Gap: 10 mEq/L (ref 3–11)
BUN: 23.8 mg/dL (ref 7.0–26.0)
CO2: 28 mEq/L (ref 22–29)
CREATININE: 1 mg/dL (ref 0.7–1.3)
Calcium: 10.1 mg/dL (ref 8.4–10.4)
Chloride: 96 mEq/L — ABNORMAL LOW (ref 98–109)
EGFR: 67 mL/min/{1.73_m2} — ABNORMAL LOW (ref >90)
Glucose: 175 mg/dl — ABNORMAL HIGH (ref 70–140)
POTASSIUM: 4.1 meq/L (ref 3.5–5.1)
SODIUM: 134 meq/L — AB (ref 136–145)
TOTAL PROTEIN: 7.6 g/dL (ref 6.4–8.3)
Total Bilirubin: 1.1 mg/dL (ref 0.20–1.20)

## 2015-01-08 MED ORDER — APIXABAN 2.5 MG PO TABS: 2.5000 mg | ORAL_TABLET | Freq: Two times a day (BID) | ORAL | Status: AC

## 2015-01-08 MED ORDER — FAMOTIDINE IN NACL 20-0.9 MG/50ML-% IV SOLN
INTRAVENOUS | Status: AC
  Filled 2015-01-08: qty 100

## 2015-01-08 MED ORDER — FAMOTIDINE IN NACL 20-0.9 MG/50ML-% IV SOLN
40.0000 mg | Freq: Two times a day (BID) | INTRAVENOUS | Status: DC
  Administered 2015-01-08: 40 mg via INTRAVENOUS

## 2015-01-08 MED ORDER — SODIUM CHLORIDE 0.9 % IV SOLN
INTRAVENOUS | Status: AC
  Administered 2015-01-08: 10:00:00 via INTRAVENOUS

## 2015-01-08 MED ORDER — SODIUM CHLORIDE 0.9 % IJ SOLN
10.0000 mL | INTRAMUSCULAR | Status: DC | PRN
Start: 2015-01-08 — End: 2015-01-08
  Administered 2015-01-08: 10 mL via INTRAVENOUS
  Filled 2015-01-08: qty 10

## 2015-01-08 MED ORDER — HEPARIN SOD (PORK) LOCK FLUSH 100 UNIT/ML IV SOLN
500.0000 [IU] | Freq: Once | INTRAVENOUS | Status: AC
  Administered 2015-01-08: 500 [IU] via INTRAVENOUS
  Filled 2015-01-08: qty 5

## 2015-01-08 NOTE — Patient Instructions (Signed)
Dehydration, Adult Dehydration is when you lose more fluids from the body than you take in. Vital organs like the kidneys, brain, and heart cannot function without a proper amount of fluids and salt. Any loss of fluids from the body can cause dehydration.  CAUSES   Vomiting.  Diarrhea.  Excessive sweating.  Excessive urine output.  Fever. SYMPTOMS  Mild dehydration  Thirst.  Dry lips.  Slightly dry mouth. Moderate dehydration  Very dry mouth.  Sunken eyes.  Skin does not bounce back quickly when lightly pinched and released.  Dark urine and decreased urine production.  Decreased tear production.  Headache. Severe dehydration  Very dry mouth.  Extreme thirst.  Rapid, weak pulse (more than 100 beats per minute at rest).  Cold hands and feet.  Not able to sweat in spite of heat and temperature.  Rapid breathing.  Blue lips.  Confusion and lethargy.  Difficulty being awakened.  Minimal urine production.  No tears. DIAGNOSIS  Your caregiver will diagnose dehydration based on your symptoms and your exam. Blood and urine tests will help confirm the diagnosis. The diagnostic evaluation should also identify the cause of dehydration. TREATMENT  Treatment of mild or moderate dehydration can often be done at home by increasing the amount of fluids that you drink. It is best to drink small amounts of fluid more often. Drinking too much at one time can make vomiting worse. Refer to the home care instructions below. Severe dehydration needs to be treated at the hospital where you will probably be given intravenous (IV) fluids that contain water and electrolytes. HOME CARE INSTRUCTIONS   Ask your caregiver about specific rehydration instructions.  Drink enough fluids to keep your urine clear or pale yellow.  Drink small amounts frequently if you have nausea and vomiting.  Eat as you normally do.  Avoid:  Foods or drinks high in sugar.  Carbonated  drinks.  Juice.  Extremely hot or cold fluids.  Drinks with caffeine.  Fatty, greasy foods.  Alcohol.  Tobacco.  Overeating.  Gelatin desserts.  Wash your hands well to avoid spreading bacteria and viruses.  Only take over-the-counter or prescription medicines for pain, discomfort, or fever as directed by your caregiver.  Ask your caregiver if you should continue all prescribed and over-the-counter medicines.  Keep all follow-up appointments with your caregiver. SEEK MEDICAL CARE IF:  You have abdominal pain and it increases or stays in one area (localizes).  You have a rash, stiff neck, or severe headache.  You are irritable, sleepy, or difficult to awaken.  You are weak, dizzy, or extremely thirsty. SEEK IMMEDIATE MEDICAL CARE IF:   You are unable to keep fluids down or you get worse despite treatment.  You have frequent episodes of vomiting or diarrhea.  You have blood or green matter (bile) in your vomit.  You have blood in your stool or your stool looks black and tarry.  You have not urinated in 6 to 8 hours, or you have only urinated a small amount of very dark urine.  You have a fever.  You faint. MAKE SURE YOU:   Understand these instructions.  Will watch your condition.  Will get help right away if you are not doing well or get worse. Document Released: 07/06/2005 Document Revised: 09/28/2011 Document Reviewed: 02/23/2011 Exit[employer] Patient Information 2015 [employer]. This information is not intended to replace advice given to you by your health care provider. Make sure you discuss any questions you have with your health care   provider.  

## 2015-01-08 NOTE — Progress Notes (Signed)
Hematology and Oncology Follow Up Visit  [name] [name] [id number] Feb 23, 1923 81 y.o. 01/08/2015   Principle Diagnosis:  Stage IV pancreatic cancer with mets to liver. Bilateral lower extremity DVT  Current Therapy:    Xeloda s/p 8 cycles  ELIQUIS 2.5 mg by mouth twice a day     Interim History:  Mr.  [name] [name] is back for follow-up. He looks much worse. He comes in a wheelchair. His daughter from [place] came with him. She is a Education officer, museum. She also coaches the high school swim team. As such, we had a lot to talk about.  He was last hospitalized back in late May. He had a urinary tract infection. He was dehydrated.  He's lost about 20 pounds since we last saw him.  he just looks more feeble.  When he was hospitalized, he has some scans done which showed relatively stable disease. His CA 19-9 has been stable.  He just completed his last cycle of Xeloda. I don't think he needs to take anymore Xeloda until we know what is going on with his cancer.  He is really behaving as if his cancer is progressing.  He's had no bleeding. He is on ELIQUIS. I need to make sure that he is on the 2.5 mg dose.  His performance status is ECOG  3  Medications:  Current outpatient prescriptions:  .  acetaminophen (TYLENOL) 325 MG tablet, Take by mouth every 6 (six) hours as needed for fever (For fever greater than 101. Give one dose)., Disp: , Rfl:  .  apixaban (ELIQUIS) 5 MG TABS tablet, Take 1 tablet (5 mg total) by mouth 2 (two) times daily., Disp: , Rfl:  .  bisacodyl (DULCOLAX) 10 MG suppository, Place 1 suppository (10 mg total) rectally daily as needed for moderate constipation., Disp: 12 suppository, Rfl: 0 .  capecitabine (XELODA) 500 MG tablet, Take 3 tablets (1,500 mg total) by mouth 2 (two) times daily after a meal. TAKES 3 TABS (1500) MG BID  ///  ON 14 DAYS OFF 7 DAYS FOR 3 CYCLES.   STARTED 06/06/14 (Patient taking differently: Take 1,500 mg by mouth 2 (two) times daily after a  meal. Take 3 tablets (1500mg ) by mouth twice daily. Give for 14 days, Off for seven days), Disp: , Rfl:  .  docusate sodium 100 MG CAPS, Take 100 mg by mouth 2 (two) times daily., Disp: 60 capsule, Rfl: 0 .  feeding supplement, GLUCERNA SHAKE, (GLUCERNA SHAKE) LIQD, Take 237 mLs by mouth 2 (two) times daily between meals as needed (If pt with <50% meal completion)., Disp: , Rfl: 0 .  HYDROcodone-acetaminophen (NORCO) 10-325 MG per tablet, Take one tablet by mouth every 4 hours as needed for pain, Disp: 180 tablet, Rfl: 0 .  insulin glargine (LANTUS) 100 UNIT/ML injection, Inject 3 Units into the skin at bedtime., Disp: , Rfl:  .  lactulose (CHRONULAC) 10 GM/15ML solution, Take 20 g by mouth daily as needed for mild constipation (54ml as needed for constipation). , Disp: , Rfl:  .  lidocaine-prilocaine (EMLA) cream, Apply 1 application topically 6 (six) times daily. Apply topically every 4 hours as needed for pain, Disp: , Rfl:  .  LORazepam (ATIVAN) 0.5 MG tablet, Take 2 tablets (1 mg total) by mouth every 6 (six) hours as needed (nausea/vomiting). (Patient taking differently: Take 0.5-1 mg by mouth See admin instructions. ), Disp: 15 tablet, Rfl: 0 .  methylPREDNISolone (MEDROL DOSEPAK) 4 MG TBPK tablet, Take as directed on package.,  Disp: 21 tablet, Rfl: 0 .  mirtazapine (REMERON) 7.5 MG tablet, Take 7.5 mg by mouth at bedtime., Disp: , Rfl:  .  morphine (ROXANOL) 20 MG/ML concentrated solution, Take 5 mg by mouth every 2 (two) hours as needed for severe pain (Give 0.37ml=5mg )., Disp: , Rfl:  .  Multiple Vitamin (MULTIVITAMIN WITH MINERALS) TABS tablet, Take 1 tablet by mouth daily., Disp: , Rfl:  .  Multiple Vitamins-Minerals (PRESERVISION AREDS 2) CAPS, Take 1 capsule by mouth 2 (two) times daily., Disp: , Rfl:  .  naproxen sodium (ANAPROX) 220 MG tablet, Take 220 mg by mouth 2 (two) times daily with a meal., Disp: , Rfl:  .  nitroGLYCERIN (NITROSTAT) 0.4 MG SL tablet, Place 1 tablet (0.4 mg total)  under the tongue every 5 (five) minutes as needed for chest pain., Disp: 90 tablet, Rfl: 3 .  Omega-3 Fatty Acids (FISH OIL) 1000 MG CAPS, Take 1 capsule by mouth., Disp: , Rfl:  .  ondansetron (ZOFRAN) 8 MG tablet, Take by mouth 2 (two) times daily as needed for nausea or vomiting. , Disp: , Rfl:  .  oxyCODONE (OXY IR/ROXICODONE) 5 MG immediate release tablet, Take one tablet by mouth twice daily for pain, Disp: 60 tablet, Rfl: 0 .  polyethylene glycol (MIRALAX / GLYCOLAX) packet, Take 17 g by mouth 2 (two) times daily., Disp: , Rfl:  .  polyvinyl alcohol (LIQUIFILM TEARS) 1.4 % ophthalmic solution, Place 1 drop into both eyes 4 (four) times daily as needed for dry eyes., Disp: , Rfl:  .  pravastatin (PRAVACHOL) 40 MG tablet, Take 40 mg by mouth every evening. , Disp: , Rfl:  .  pregabalin (LYRICA) 75 MG capsule, Take 75 mg by mouth daily., Disp: , Rfl:  .  prochlorperazine (COMPAZINE) 5 MG tablet, Take 5 mg by mouth every 6 (six) hours as needed for nausea or vomiting., Disp: , Rfl:  .  ranitidine (ZANTAC) 150 MG tablet, Take 150 mg by mouth 2 (two) times daily., Disp: , Rfl:  .  temazepam (RESTORIL) 15 MG capsule, Take 15 mg by mouth at bedtime. , Disp: , Rfl:  .  [DISCONTINUED] Calcium Carbonate-Vitamin D (CALTRATE 600+D) 600-400 MG-UNIT per tablet, Take 1 tablet by mouth daily. , Disp: , Rfl:  No current facility-administered medications for this visit.  Facility-Administered Medications Ordered in Other Visits:  .  0.9 %  sodium chloride infusion, , Intravenous, Continuous, Volanda Napoleon, MD, Last Rate: 500 mL/hr at 01/08/15 0945 .  famotidine (PEPCID) IVPB 20 mg premix, 40 mg, Intravenous, Q12H, Volanda Napoleon, MD, 40 mg at 01/08/15 1962  Allergies: No Known Allergies  Past Medical History, Surgical history, Social history, and Family History were reviewed and updated.  Review of Systems: As above  Physical Exam:  height is 5\' 10"  (1.778 m) and weight is 153 lb (69.4 kg). His oral  temperature is 99.9 F (37.7 C). His blood pressure is 111/79 and his pulse is 111. His respiration is 18.   Elderly white gentleman in no obvious distress. Head and neck exam shows no ocular or oral lesions. He has no palpable cervical or supraclavicular lymph nodes. There might be some slight temporal muscle wasting. Lungs are clear. Cardiac exam regular rate and rhythm with no murmurs, rubs or bruits. Abdomen is soft. There is no fluid wave. There is no palpable liver or spleen tip. He has no palpable abdominal mass. Back exam shows no tenderness over the spine, ribs or hips. Extremities shows 1+  edema in his lower legs. He has some osteoarthritic changes in his joints. Skin exam shows no rashes, ecchymoses or petechia. He does have some skin breakdown in the lower sacral area. Neurological exam is nonfocal.  Lab Results  Component Value Date   WBC 8.3 01/08/2015   HGB 11.9* 01/08/2015   HCT 35.1* 01/08/2015   MCV 99* 01/08/2015   PLT 340 01/08/2015     Chemistry      Component Value Date/Time   NA 136* 12/25/2014   NA 137 12/16/2014 0425   NA 138 11/26/2014 1156   K 4.6 12/25/2014   K 3.3 11/26/2014 1156   CL 104 12/16/2014 0425   CL 102 11/26/2014 1156   CO2 26 12/16/2014 0425   CO2 29 11/26/2014 1156   BUN 25* 12/25/2014   BUN 22* 12/16/2014 0425   BUN 12 11/26/2014 1156   CREATININE 0.9 12/25/2014   CREATININE 0.99 12/16/2014 0425   CREATININE 0.9 11/26/2014 1156   GLU 169 12/25/2014      Component Value Date/Time   CALCIUM 8.0* 12/16/2014 0425   CALCIUM 9.1 11/26/2014 1156   ALKPHOS 210* 12/25/2014   ALKPHOS 92* 11/26/2014 1156   AST 12* 12/25/2014   AST 23 11/26/2014 1156   ALT 13 12/25/2014   ALT 12 11/26/2014 1156   BILITOT 0.9 12/16/2014 0425   BILITOT 0.90 11/26/2014 1156         Impression and Plan: Mr. Vold is 81 year old gentleman. He has metastatic pancreatic cancer. He is done extremely well given that he was diagnosed back in September of last  year.  Again, his performance status has declined. His weight has gone down.  I really think that we have to rescan him. I want to get a scan on him in 2 weeks. We will do this when we see him back.  I will have to make some adjustments with his medicines.  The fact that he is requiring some morphine I think is an indicator of his disease progressing.  I told his daughter that every 5 his disease is progressing, that we really need to focus on his quality of life and that we probably need to get hospice involved.  I spent about 35 minutes with he and his daughter. I will give him some IV fluids today.Volanda Napoleon, MD 6/21/201610:36 AM

## 2015-01-09 ENCOUNTER — Non-Acute Institutional Stay (SKILLED_NURSING_FACILITY): Payer: Medicare Other | Admitting: Nurse Practitioner

## 2015-01-09 ENCOUNTER — Telehealth: Payer: Self-pay | Admitting: *Deleted

## 2015-01-09 ENCOUNTER — Encounter: Payer: Self-pay | Admitting: Nurse Practitioner

## 2015-01-09 DIAGNOSIS — I5022 Chronic systolic (congestive) heart failure: Secondary | ICD-10-CM

## 2015-01-09 DIAGNOSIS — G4700 Insomnia, unspecified: Secondary | ICD-10-CM | POA: Diagnosis not present

## 2015-01-09 DIAGNOSIS — N390 Urinary tract infection, site not specified: Secondary | ICD-10-CM | POA: Diagnosis not present

## 2015-01-09 DIAGNOSIS — I82409 Acute embolism and thrombosis of unspecified deep veins of unspecified lower extremity: Secondary | ICD-10-CM

## 2015-01-09 DIAGNOSIS — D649 Anemia, unspecified: Secondary | ICD-10-CM | POA: Diagnosis not present

## 2015-01-09 DIAGNOSIS — K5900 Constipation, unspecified: Secondary | ICD-10-CM

## 2015-01-09 DIAGNOSIS — E440 Moderate protein-calorie malnutrition: Secondary | ICD-10-CM

## 2015-01-09 DIAGNOSIS — E1140 Type 2 diabetes mellitus with diabetic neuropathy, unspecified: Secondary | ICD-10-CM

## 2015-01-09 DIAGNOSIS — I10 Essential (primary) hypertension: Secondary | ICD-10-CM | POA: Diagnosis not present

## 2015-01-09 DIAGNOSIS — K219 Gastro-esophageal reflux disease without esophagitis: Secondary | ICD-10-CM

## 2015-01-09 DIAGNOSIS — C259 Malignant neoplasm of pancreas, unspecified: Secondary | ICD-10-CM | POA: Diagnosis not present

## 2015-01-09 NOTE — Assessment & Plan Note (Signed)
01/08/15 decreased Eliquis to 2.5mg  id.

## 2015-01-09 NOTE — Assessment & Plan Note (Signed)
05/01/14 albumin 2.8 05/29/14 albumin 3.0 12/25/14 albumin 2.9 -supportive measures.

## 2015-01-09 NOTE — Assessment & Plan Note (Signed)
Takes Pregabalin 75mg  daily. Ambulates with walker on unit

## 2015-01-09 NOTE — Assessment & Plan Note (Signed)
01/08/15 urine culture E Coli, 10 day course of Septra DS bid started, observe the patient, Hospice referral.

## 2015-01-09 NOTE — Assessment & Plan Note (Signed)
Compensated clinically. Trace ankle edema.

## 2015-01-09 NOTE — Assessment & Plan Note (Signed)
Stable, takes Colace 100mg  bid, MiraLax bid, DulcoLax suppository nightly prn

## 2015-01-09 NOTE — Progress Notes (Signed)
Patient ID: [name] [name], male   DOB: 1923/02/11, 81 y.o.   MRN: [id number]   Code Status: DNR  No Known Allergies  Chief Complaint  Patient presents with  . Medical Management of Chronic Issues  . Acute Visit    UTI    HPI: Patient is a 81 y.o. male seen in the SNF at [facility] today for evaluation of UTI and chronic medical conditions.     Hospitalized from 12/14/2014--12/18/2014 for the following medical issues:  1. SIRS: . Initially admitted to step down. Started on broad spectrum antibiotics and fluid resuscitation. His fever has resolved and his bp improved. Blood cultures were  negative so far. Discontinued antibiotics. Tapered stress dose steroids as deemed appropriate.  2. Elevated liver function tests: improving.  3. Hypoxia: Resolved. No pneumonia found on CXR.  4. Metastatic Pancreatic Cancer: xeloda being held for now, in view of fever on admission and SIRS, it can be resumed as per oncology.  5. Acute encephalopathy: unclear etiology. Resolved. Started regular diet.       Problem List Items Addressed This Visit    Type 2 diabetes mellitus (Chronic)    12/20/13 Hgb A1c 7.3 04/09/14 CBG am 150-180, pm 186/235-adding Lantus 3 units qd qhs.  05/28/14 update Hgb A1c 7.1 -- Continue Lantus 3 units daily.        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 Ambulates with walker on unit        HYPERTENSION, BENIGN (Chronic)    Blood pressure controlled. Off Coreg 12.563mbid        SYSTOLIC HEART FAILURE, CHRONIC (Chronic)    Compensated clinically. Trace ankle edema.          Normocytic anemia (Chronic)    04/10/14 Hgb 9.3 09/17/14 Hgb 10.6 09/29/14 Hg 9.6 12/25/14 Hgb 10.3       Pancreatic cancer (Chronic)    09/17/14 Dr. EnMarin Olpcontinue Xeloda. Scan 3 mos. CA 19-9 to assess for response.  01/08/15 dc XeGlynis Smilesper oncology      DVT (deep venous thrombosis) (Chronic)    01/08/15 decreased Eliquis  to 2.29m24md.       GERD    Takes ranitidine 150m39md. Stable.          Malnutrition of moderate degree    05/01/14 albumin 2.8 05/29/14 albumin 3.0 12/25/14 albumin 2.9 -supportive measures.        Insomnia    C/o sleeps too much, change Temazepam 129mg50m to prn and Lorazepam 0.29mg q33mfor sleep. The patient stated still cannot sleep at night-improved since 08/13/14 Mirtazapine 7.29mg po60ms.  Stable, continue Mirtazapine 7.29mg nig39my and prn Temazepam 129mg hs 72mLorazepam 1mg q6h p29m         Constipation    Stable, takes Colace 100mg bid, 16mLax bid, DulcoLax suppository nightly prn        Infection of urinary tract - Primary    01/08/15 urine culture E Coli, 10 day course of Septra DS bid started, observe the patient, Hospice referral.          Review of Systems:  Review of Systems  Constitutional: Positive for fatigue. Negative for fever, chills, diaphoresis, activity change and appetite change.  HENT: Positive for hearing loss. Negative for congestion, ear discharge, ear pain, nosebleeds, sore throat and tinnitus.   Eyes: Negative for photophobia, pain, discharge and redness.  Respiratory: Negative for cough, shortness of breath, wheezing and stridor.  Cardiovascular: Positive for leg swelling. Negative for chest pain and palpitations.       Trace ankles.   Gastrointestinal: Negative for nausea, vomiting, abdominal pain, diarrhea, constipation and blood in stool.  Endocrine: Negative for polydipsia.  Genitourinary: Positive for frequency. Negative for dysuria, urgency, hematuria and flank pain.  Musculoskeletal: Negative for myalgias, back pain and neck pain.  Skin: Negative for rash.       Left great toe nail came off R upper chest Pot A Cath intact.  The left 3rd MCP swelling, tenderness, and warmth  Allergic/Immunologic: Negative for environmental allergies.  Neurological: Negative for dizziness, tremors, seizures, weakness and headaches.    Hematological: Does not bruise/bleed easily.  Psychiatric/Behavioral: Negative for suicidal ideas and hallucinations. The patient is not nervous/anxious.      Past Medical History  Diagnosis Date  . Diverticulosis of colon (without mention of hemorrhage)   . Irritable bowel syndrome   . Personal history of malignant neoplasm of prostate     s/p  radioactive seed implants 2000  . Unspecified hereditary and idiopathic peripheral neuropathy   . Nonischemic cardiomyopathy cardiologist-  dr Aundra Dubin    EF 25%  per last echo 2012 and cardiologist note--  2007-  ef  40-45%;  2008- ef 35-40%;  2010- ef 30-35%;  2012- ef 25%  . Sinus bradycardia   . Hypertension   . Arthritis     hands  . Hyperlipidemia   . History of DVT of lower extremity     2002  ,   left leg  . History of adenomatous polyp of colon     2001  . History of basal cell carcinoma excision   . Anticoagulated on Coumadin   . Type 2 diabetes, diet controlled   . GERD (gastroesophageal reflux disease)   . LBBB (left bundle branch block)   . Wears glasses   . Wears hearing aid     BILATERAL  . Unsteady gait   . DDD (degenerative disc disease)     cervical and lumbar  . DJD (degenerative joint disease)     hips  . Lower urinary tract symptoms (LUTS)   . 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 Coronary artery disease     per cath 2002  non-obstructive cad  . Chronic systolic heart failure   . H/O fracture of nose     12-30-2013-  pt fell at home open nose bone fx  . Abdominal pain 12/20/2013  . Abnormal MRI of abdomen 01/31/2014  . Acute encephalopathy 07/02/2014  . Acute thromboembolism of deep veins of lower extremity 05/03/2007    Qualifier: History of  By: Doy Mince LPN, Megan  16/10/96 Doppler LLE: DVT at left femoral and popliteal veins not completely occlusive. Will anticoagulate with Eliquis 2.28m bid.    . Anemia of chronic  disease 02/20/2013  . CARDIOMYOPATHY 07/06/2007    Qualifier: Diagnosis of  By: NLenna GilfordMD, SDeborra Medina  . Constipation 04/04/2014  . Coronary atherosclerosis 07/06/2007    Qualifier: Diagnosis of  By: NLenna GilfordMD, SPine LevelDISEASE 05/03/2007    Qualifier: Diagnosis of  By: RDoy MinceLPN, Megan    . DVT (deep venous thrombosis)   . Edema 05/28/2014  . Failure to thrive in adult 03/30/2014  . Fall in elderly patient 12/30/2013  . Fever 07/20/2014  . Frailty 03/30/2014  . FUO (fever  of unknown origin) 05/28/2014    05/28/14 CXR right lower lobe bronchopneumonia.    Marland Kitchen GERD 05/03/2007    Qualifier: Diagnosis of  By: Doy Mince LPN, Megan    . Hereditary and idiopathic peripheral neuropathy 07/06/2007    Qualifier: Diagnosis of  By: Lenna Gilford MD, Deborra Medina   . HYPERLIPIDEMIA 05/03/2007    Qualifier: Diagnosis of  By: Doy Mince LPN, Megan    . HYPERTENSION, BENIGN 06/23/2010    Qualifier: Diagnosis of  By: Olevia Perches, MD, Glenetta Hew   . Insomnia 04/04/2014  . Liver metastasis 04/01/2014  . Malnutrition of moderate degree 03/30/2014    05/01/14 albumin 2.8 05/29/14 albumin 3.0   . Metastatic cancer 03/29/2014  . Normocytic anemia 03/30/2014    04/10/14 Hgb 9.3   . Pancreatic cancer 04/06/2014    06/06/14 Oncology: stage IV pancreatic cancer with Mets to liver: CA 19-9 400--194. CT showed more liver metastases. Xeloda 1516m bid x 14days on and 7 days off. 3 cycles of tx and then repeat the scans.    . Pressure ulcer stage II 08/15/2014    08/14/14 L scrotum small PU-not infected-tx initiated.    . Protein-calorie malnutrition, severe 07/05/2014  . SPINAL STENOSIS, LUMBAR 07/06/2007    Qualifier: Diagnosis of  By: NLenna GilfordMD, SDeborra Medina  . Streptococcal bacteremia 07/23/2014  . Transaminitis 03/30/2014    04/10/14 Alkaline phosphatase 126(39-117) 05/01/14 AST 33, ALT 45, alk  Phos 75 05/29/14 AST 64, ALT 70 alk phos 209   . Type 2 diabetes mellitus 05/03/2007    Qualifier: Diagnosis of  By: RDoy Mince LPN, Megan  115/40/08Hgb A1c 7.1    . Weakness generalized 04/02/2014  . H/O calcium pyrophosphate deposition disease (CPPD) 12/14/2014   Past Surgical History  Procedure Laterality Date  . Knee arthroscopy Left 1991  . Ercp with sphincterotomy and stone removal  05/ 1993  &  05-11-2006  . Total knee arthroplasty  10/05/2011    Procedure: TOTAL KNEE ARTHROPLASTY;  Surgeon: FGearlean Alf MD;  Location: [facility];  Service: Orthopedics;  Laterality: Right;  . Transurethral resection of bladder tumor N/A 09/30/2012    Procedure: TRANSURETHRAL RESECTION OF BLADDER TUMOR (TURBT);  Surgeon: SFranchot Gallo MD;  Location: [facility];  Service: Urology;  Laterality: N/A;  WITH MITOMYCIN INSTILLATION   . Eus N/A 01/04/2014    Procedure: ESOPHAGEAL ENDOSCOPIC ULTRASOUND (EUS) RADIAL;  Surgeon: DMilus Banister MD;  Location: [facility];  Service: Endoscopy;  Laterality: N/A;  . Radioactive prostate seed implants  01/ 2000  . Mohs surgery  03/ 2009    LEFT EAR BASAL CELL CARCINOMA  . Tonsillectomy  AS CHILD  . Cholecystectomy open  1985  . Appendectomy  child  . Inguinal hernia repair Bilateral 1998    recurrent RIH  w/ repair 06-21-2006  . Total knee arthroplasty Left 02-21-2007    AND 04-21-2007  CLOSED MANIPULATION  . Cataract extraction w/ intraocular lens implant Right 2004  . Lumbar spine surgery  X2  . Cardiac catheterization  06-14-2001  dr bDarnell Levelbrodie    non-obstructive CAD/  LM 30%,  pLAD 50%,  mLAD 30%,  RCA  &  CFX  irregularites ,  global hypokinesis,  ef 40%  . Cardiovascular stress test  02-07-2007   dr bDarnell Levelbrodie    negative for ischemia,  low risk myoview perfusion study  . Transthoracic echocardiogram  01-28-2011    moderate LVH/  ef 25%/  diffuse hypokinesis/  mild LAE/  mild  TR/  trivial AR & MR  . Cystoscopy w/ retrogrades Bilateral 03/09/2014    Procedure: CYSTOSCOPY WITH BILATERAL RETROGRADE PYELOGRAM BLADDER BX AND RENAL WASHINGS.;  Surgeon: Jorja Loa, MD;   Location: [facility];  Service: Urology;  Laterality: Bilateral;  . Eus N/A 04/05/2014    Procedure: UPPER ENDOSCOPIC ULTRASOUND (EUS) LINEAR;  Surgeon: [name], MD;  Location: [facility];  Service: Endoscopy;  Laterality: N/A;  radial linear  . Endoscopic retrograde cholangiopancreatography (ercp) with propofol N/A 04/12/2014    Procedure: ENDOSCOPIC RETROGRADE CHOLANGIOPANCREATOGRAPHY (ERCP) WITH PROPOFOL;  Surgeon: [name], MD;  Location: [facility];  Service: Endoscopy;  Laterality: N/A;  metal stent  . Biliary stent placement N/A 04/12/2014    Procedure: BILIARY STENT PLACEMENT;  Surgeon: [name], MD;  Location: [facility];  Service: Endoscopy;  Laterality: N/A;   Social History:   reports that he quit smoking about 32 years ago. His smoking use included Cigarettes. He started smoking about 60 years ago. He has a 10 pack-year smoking history. He has never used smokeless tobacco. He reports that he does not drink alcohol or use illicit drugs.  Family History  Problem Relation Age of Onset  . Throat cancer Brother   . Coronary artery disease Father     deceased    Medications: Patient's Medications  New Prescriptions   No medications on file  Previous Medications   ACETAMINOPHEN (TYLENOL) 325 MG TABLET    Take by mouth every 6 (six) hours as needed for fever (For fever greater than 101. Give one dose).   APIXABAN (ELIQUIS) 2.5 MG TABS TABLET    Take 1 tablet (2.5 mg total) by mouth 2 (two) times daily.   BISACODYL (DULCOLAX) 10 MG SUPPOSITORY    Place 1 suppository (10 mg total) rectally daily as needed for moderate constipation.   CAPECITABINE (XELODA) 500 MG TABLET    Take 3 tablets (1,500 mg total) by mouth 2 (two) times daily after a meal. TAKES 3 TABS (1500) MG BID  ///  ON 14 DAYS OFF 7 DAYS FOR 3 CYCLES.   STARTED 06/06/14   DOCUSATE SODIUM 100 MG CAPS    Take 100 mg by mouth 2 (two) times daily.   FEEDING SUPPLEMENT, GLUCERNA SHAKE,  (GLUCERNA SHAKE) LIQD    Take 237 mLs by mouth 2 (two) times daily between meals as needed (If pt with <50% meal completion).   HYDROCODONE-ACETAMINOPHEN (NORCO) 10-325 MG PER TABLET    Take one tablet by mouth every 4 hours as needed for pain   INSULIN GLARGINE (LANTUS) 100 UNIT/ML INJECTION    Inject 3 Units into the skin at bedtime.   LACTULOSE (CHRONULAC) 10 GM/15ML SOLUTION    Take 20 g by mouth daily as needed for mild constipation (42m as needed for constipation).    LIDOCAINE-PRILOCAINE (EMLA) CREAM    Apply 1 application topically 6 (six) times daily. Apply topically every 4 hours as needed for pain   LORAZEPAM (ATIVAN) 0.5 MG TABLET    Take 2 tablets (1 mg total) by mouth every 6 (six) hours as needed (nausea/vomiting).   METHYLPREDNISOLONE (MEDROL DOSEPAK) 4 MG TBPK TABLET    Take as directed on package.   MIRTAZAPINE (REMERON) 7.5 MG TABLET    Take 7.5 mg by mouth at bedtime.   MORPHINE (ROXANOL) 20 MG/ML CONCENTRATED SOLUTION    Take 5 mg by mouth every 2 (two) hours as needed for severe pain (Give 0.228m5mg).   MULTIPLE  VITAMIN (MULTIVITAMIN WITH MINERALS) TABS TABLET    Take 1 tablet by mouth daily.   MULTIPLE VITAMINS-MINERALS (PRESERVISION AREDS 2) CAPS    Take 1 capsule by mouth 2 (two) times daily.   NAPROXEN SODIUM (ANAPROX) 220 MG TABLET    Take 220 mg by mouth 2 (two) times daily with a meal.   NITROGLYCERIN (NITROSTAT) 0.4 MG SL TABLET    Place 1 tablet (0.4 mg total) under the tongue every 5 (five) minutes as needed for chest pain.   OMEGA-3 FATTY ACIDS (FISH OIL) 1000 MG CAPS    Take 1 capsule by mouth.   ONDANSETRON (ZOFRAN) 8 MG TABLET    Take by mouth 2 (two) times daily as needed for nausea or vomiting.    OXYCODONE (OXY IR/ROXICODONE) 5 MG IMMEDIATE RELEASE TABLET    Take one tablet by mouth twice daily for pain   POLYETHYLENE GLYCOL (MIRALAX / GLYCOLAX) PACKET    Take 17 g by mouth 2 (two) times daily.   POLYVINYL ALCOHOL (LIQUIFILM TEARS) 1.4 % OPHTHALMIC SOLUTION     Place 1 drop into both eyes 4 (four) times daily as needed for dry eyes.   PRAVASTATIN (PRAVACHOL) 40 MG TABLET    Take 40 mg by mouth every evening.    PREGABALIN (LYRICA) 75 MG CAPSULE    Take 75 mg by mouth daily.   PROCHLORPERAZINE (COMPAZINE) 5 MG TABLET    Take 5 mg by mouth every 6 (six) hours as needed for nausea or vomiting.   RANITIDINE (ZANTAC) 150 MG TABLET    Take 150 mg by mouth 2 (two) times daily.   TEMAZEPAM (RESTORIL) 15 MG CAPSULE    Take 15 mg by mouth at bedtime.   Modified Medications   No medications on file  Discontinued Medications   No medications on file     Physical Exam: Physical Exam  Constitutional: He is oriented to person, place, and time. He appears well-developed and well-nourished.  HENT:  Head: Normocephalic and atraumatic.  Mouth/Throat: Oropharynx is clear and moist.  Eyes: Conjunctivae and EOM are normal. Pupils are equal, round, and reactive to light. Right eye exhibits no discharge. Left eye exhibits no discharge. No scleral icterus.  Neck: Normal range of motion. Neck supple. No JVD present. No tracheal deviation present. No thyromegaly present.  Cardiovascular: Normal rate, regular rhythm and normal heart sounds.   Pulmonary/Chest: Effort normal and breath sounds normal. No stridor. He has no wheezes. He has no rales. He exhibits no tenderness.  Abdominal: Soft. Bowel sounds are normal. He exhibits no distension. There is no tenderness. There is no rebound and no guarding.  Minimal epigastric tenderness.  No ascites  Musculoskeletal: Normal range of motion. He exhibits edema. He exhibits no tenderness.  LLE  Lymphadenopathy:    He has no cervical adenopathy.  Neurological: He is alert and oriented to person, place, and time. No cranial nerve deficit.  Skin: Skin is warm and dry. No rash noted. No erythema. No pallor.  The left 3rd MCP swelling, tenderness, and warmth  Psychiatric: He has a normal mood and affect. His behavior is normal.     Filed Vitals:   01/09/15 1153  BP: 110/60  Pulse: 86  Temp: 99.9 F (37.7 C)  TempSrc: Tympanic  Resp: 20      Labs reviewed: Basic Metabolic Panel:  Recent Labs  05/29/14  07/20/14 0500  12/14/14 1429 12/15/14 0335 12/16/14 0425 12/25/14 01/08/15 0925  NA 136*  < > 137  < >  138 136 137 136* 134*  K 4.3  < > 4.0  < > 4.4 4.3 4.3 4.6 4.1  CL  --   < > 103  < > 100* 103 104  --   --   CO2  --   < > 27  < > _0 --  28  GLUCOSE  --   < > 122*  < > 148* 148* 273*  --  175*  BUN 22*  < > 21  < > 17 14 22* 25* 23.8  CREATININE 1.0  < > 0.99  < > 0.72 0.73 0.99 0.9 1.0  CALCIUM  --   < > 8.8  < > 8.6* 8.0* 8.0*  --  10.1  TSH 1.15  --  0.846  --   --   --   --   --   --   < > = values in this interval not displayed. Liver Function Tests:  Recent Labs  12/15/14 0335 12/16/14 0425 12/25/14 01/08/15 0925  AST 171* 57* 12* 14  ALT 79* 44 13 10  ALKPHOS 510* 342* 210* 174*  BILITOT 4.4* 0.9  --  1.10  PROT 6.0* 5.4*  --  7.6  ALBUMIN 2.6* 2.2*  --  2.9*    Recent Labs  03/29/14 1610 07/02/14 1845  LIPASE 10* 7*    Recent Labs  12/14/14 1820  AMMONIA 16   CBC:  Recent Labs  11/05/14 1043  11/26/14 1155  12/15/14 0335 12/16/14 0425 12/17/14 2112 12/25/14 01/08/15 0835  WBC 6.3  --  4.9  < > 7.1 7.2  --  4.8 8.3  NEUTROABS 4.4  --  3.4  --   --   --   --   --  5.1  HGB 9.4*  < > 9.9*  < > 8.4* 8.4* 10.1* 10.3* 11.9*  HCT 28.4*  < > 29.6*  < > 25.9* 25.8* 31.3* 31* 35.1*  MCV 108*  --  108*  < > 107.0* 107.5*  --   --  99*  PLT 227  < > 219  < > 212 253  --  191 340  < > = values in this interval not displayed. Lipid Panel: No results for input(s): CHOL, HDL, LDLCALC, TRIG, CHOLHDL, LDLDIRECT in the last 8760 hours.  Past Procedures:  03/29/14 CT abd and pelvis w CM:  IMPRESSION:  4.9 cm enhancing lesion adjacent to the pancreatic head/uncinate  process, worrisome for primary pancreatic adenocarcinoma,  progressed.  Tumor likely  involves the SMA/SMV and undersurface of the portal  vein. 9 mm short axis node in the porta hepatitis.  Suspected multifocal hepatic metastases, including a 3.2 cm lesion  in the right hepatic dome.   03/30/14 MR w wo CM abd:   IMPRESSION:  1. Subtle amorphous mass in the pancreatic head and proximal  pancreatic body currently measuring 4.4 x 3.2 cm, causing distal  common bile duct obstruction and demonstrating vascular involvement  (SMA, SMV, splenic vein, splenic portal confluence and proximal  portal vein), as above, highly concerning for primary pancreatic  neoplasm.  2. Although there do appear to be multiple ill-defined hepatic  lesions, concerning for metastatic disease, these were poorly  evaluated on today's examination secondary to patient respiratory  motion. Continued attention on any future followup studies is  recommended, as metastatic disease to the liver is suspected.  3. Severe intra and extrahepatic biliary ductal dilatation.   05/04/14 Doppler LLE: DVT at left  femoral and popliteal veins not completely occlusive. Will anticoagulate with Eliquis 2.2m bid.    07/20/14 CT abd and pelvis:   IMPRESSION: 1. New tiny pleural effusions with adjacent atelectasis/ infiltrate. 2. Stable poorly marginated hepatic lesions suggesting metastatic disease. 3. Little change in pancreatic mass with patent metallic biliary stent and pneumobilia.  11/26/14 CT abd and pelvis with contrast:  1 no definite new metastatic lesions in the liver and lack of visualization of the previously treated metastatic lesions in the liver 2 small area of hypervascularity adjacent to the gallbladder fossa in the liver is favored to represent a perfusion anomaly.  3 persistent mass like thickening of the distal rectum immedialtely above the anorectal junction-concerning for potential neoplasm.  4 extensive colonic diverticulosis  5 patent stent in the common bile duct  11/28/14  X-ray Left hand: no acute dislocation or fracture in the left hand.   12/14/14 CT head wo contrast:  IMPRESSION: No acute findings. Stable atrophy and small vessel disease  12/14/14 CT abd and pelvis w contrast:   IMPRESSION: 1. With respect to the patient's pancreatic malignancy findings on today's are exam are not significantly changed compared with previous study. There is a lesion within the dome of liver which is unchanged from 09/17/2014 in may represent an area of liver metastasis. This was not well seen on study from 11/26/2014. 2. Biliary dilatation and pneumobilia. The degree of intrahepatic bile duct dilatation is not significantly changed. There has been mild increase caliber of the common bile duct. 3. New splenomegaly and ascites. Findings may reflect changes of portal venous hypertension secondary to patient's worsening liver function. 4. Increase in volume of bilateral pleural effusions.  12/14/14 CXR  IMPRESSION: Increased central pulmonary vascular congestion is noted with probable bilateral pulmonary edema.   Assessment/Plan Infection of urinary tract 01/08/15 urine culture E Coli, 10 day course of Septra DS bid started, observe the patient, Hospice referral.   Pancreatic cancer 09/17/14 Dr. EMarin Olp continue Xeloda. Scan 3 mos. CA 19-9 to assess for response.  01/08/15 dc XGlynis Smiles per oncology  DVT (deep venous thrombosis) 01/08/15 decreased Eliquis to 2.527mid.   Constipation Stable, takes Colace 10049mid, MiraLax bid, DulcoLax suppository nightly prn    Insomnia C/o sleeps too much, change Temazepam 32m83ms to prn and Lorazepam 0.5mg 13m for sleep. The patient stated still cannot sleep at night-improved since 08/13/14 Mirtazapine 7.5mg p39mhs.  Stable, continue Mirtazapine 7.5mg ni69mly and prn Temazepam 32mg hs67m Lorazepam 1mg q6h 52m.     Malnutrition of moderate degree 05/01/14 albumin 2.8 05/29/14 albumin 3.0 12/25/14 albumin  2.9 -supportive measures.    GERD Takes ranitidine 150mg bid.10mble.      Normocytic anemia 04/10/14 Hgb 9.3 09/17/14 Hgb 10.6 09/29/14 Hg 9.6 12/25/14 Hgb 10.3   SYS53.6C HEART FAILURE, CHRONIC Compensated clinically. Trace ankle edema.      HYPERTENSION, BENIGN Blood pressure controlled. Off Coreg 12.5mg bid   26muropathy due to type 2 diabetes mellitus Takes Pregabalin 75mg daily.36mulates with walker on unit    Type 2 diabetes mellitus 12/20/13 Hgb A1c 7.3 04/09/14 CBG am 150-180, pm 186/235-adding Lantus 3 units qd qhs.  05/28/14 update Hgb A1c 7.1 -- Continue Lantus 3 units daily.      Family/ Staff Communication: observe the patient  Goals of Care: SNF  Labs/tests ordered: none

## 2015-01-09 NOTE — Assessment & Plan Note (Signed)
12/20/13 Hgb A1c 7.3 04/09/14 CBG am 150-180, pm 186/235-adding Lantus 3 units qd qhs.  05/28/14 update Hgb A1c 7.1 -- Continue Lantus 3 units daily.

## 2015-01-09 NOTE — Assessment & Plan Note (Signed)
C/o sleeps too much, change Temazepam 15mg  qhs to prn and Lorazepam 0.5mg  qhs for sleep. The patient stated still cannot sleep at night-improved since 08/13/14 Mirtazapine 7.5mg  po qhs.  Stable, continue Mirtazapine 7.5mg  nightly and prn Temazepam 15mg  hs and Lorazepam 1mg  q6h prn.

## 2015-01-09 NOTE — Assessment & Plan Note (Signed)
Takes ranitidine 150mg  bid. Stable.

## 2015-01-09 NOTE — Telephone Encounter (Signed)
Courtesy call to notify office of patient's enrollment in [facility]ospice [facility] [place]. Family has also chosen to cancel future scan which Dr Marin Olp had ordered. Dr Marin Olp notified.

## 2015-01-09 NOTE — Assessment & Plan Note (Signed)
04/10/14 Hgb 9.3 09/17/14 Hgb 10.6 09/29/14 Hg 9.6 12/25/14 Hgb 10.3

## 2015-01-09 NOTE — Assessment & Plan Note (Signed)
09/17/14 Dr. Marin Olp: continue Xeloda. Scan 3 mos. CA 19-9 to assess for response.  01/08/15 dc [name], per oncology

## 2015-01-09 NOTE — Assessment & Plan Note (Signed)
Blood pressure controlled. Off Coreg 12.5mg  bid

## 2015-01-23 ENCOUNTER — Encounter: Payer: Self-pay | Admitting: Nurse Practitioner

## 2015-01-23 ENCOUNTER — Non-Acute Institutional Stay (SKILLED_NURSING_FACILITY): Payer: Medicare Other | Admitting: Nurse Practitioner

## 2015-01-23 DIAGNOSIS — C259 Malignant neoplasm of pancreas, unspecified: Secondary | ICD-10-CM

## 2015-01-23 DIAGNOSIS — R509 Fever, unspecified: Secondary | ICD-10-CM | POA: Diagnosis not present

## 2015-01-23 DIAGNOSIS — I10 Essential (primary) hypertension: Secondary | ICD-10-CM | POA: Diagnosis not present

## 2015-01-23 DIAGNOSIS — G4700 Insomnia, unspecified: Secondary | ICD-10-CM

## 2015-01-23 DIAGNOSIS — K5900 Constipation, unspecified: Secondary | ICD-10-CM | POA: Diagnosis not present

## 2015-01-23 DIAGNOSIS — D638 Anemia in other chronic diseases classified elsewhere: Secondary | ICD-10-CM | POA: Diagnosis not present

## 2015-01-23 DIAGNOSIS — I5022 Chronic systolic (congestive) heart failure: Secondary | ICD-10-CM

## 2015-01-23 DIAGNOSIS — E1140 Type 2 diabetes mellitus with diabetic neuropathy, unspecified: Secondary | ICD-10-CM

## 2015-01-23 DIAGNOSIS — I82409 Acute embolism and thrombosis of unspecified deep veins of unspecified lower extremity: Secondary | ICD-10-CM | POA: Diagnosis not present

## 2015-01-23 DIAGNOSIS — M25512 Pain in left shoulder: Secondary | ICD-10-CM | POA: Diagnosis not present

## 2015-01-23 DIAGNOSIS — K219 Gastro-esophageal reflux disease without esophagitis: Secondary | ICD-10-CM | POA: Diagnosis not present

## 2015-01-23 NOTE — Assessment & Plan Note (Addendum)
Sustained from fall 01/18/15, X-ray left shoulder AP and lateral. 01/24/15 X-ray left shoulder: minor cortical irregularity is noted in the bicipital groove region, no definite acute bony abnormalities left shoulder can be identified. Comfort measures.

## 2015-01-23 NOTE — Assessment & Plan Note (Signed)
05/28/14 CXR right lower lobe bronchopneumonia.  01/21/15 T 103.5-resolved, 10 day course of Augmentin 875mg  bid started, negative urine culture 01/23/15. 01/22/15 CXR small inflammatory process identified in the right lower lung.

## 2015-01-23 NOTE — Progress Notes (Signed)
Patient ID: [name] [name], male   DOB: February 10, 1923, 81 y.o.   MRN: [id number]   Code Status: DNR  No Known Allergies  Chief Complaint  Patient presents with  . Medical Management of Chronic Issues  . Acute Visit    fall, spiked T 01/21/15, left shoulder pain    HPI: Patient is a 81 y.o. male seen in the SNF at [facility] today for evaluation of spike T, left shoulder pain,  and chronic medical conditions.     Hospitalized from 12/14/2014--12/18/2014 for the following medical issues:  1. SIRS: . Initially admitted to step down. Started on broad spectrum antibiotics and fluid resuscitation. His fever has resolved and his bp improved. Blood cultures were  negative so far. Discontinued antibiotics. Tapered stress dose steroids as deemed appropriate.  2. Elevated liver function tests: improving.  3. Hypoxia: Resolved. No pneumonia found on CXR.  4. Metastatic Pancreatic Cancer: xeloda being held for now, in view of fever on admission and SIRS, it can be resumed as per oncology.  5. Acute encephalopathy: unclear etiology. Resolved. Started regular diet.       Problem List Items Addressed This Visit    Type 2 diabetes mellitus (Chronic)    12/20/13 Hgb A1c 7.3 04/09/14 CBG am 150-180, pm 186/235-adding Lantus 3 units qd qhs.  05/28/14 update Hgb A1c 7.1 -- Continue Lantus 3 units daily.        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 Ambulates with walker on unit       HYPERTENSION, BENIGN (Chronic)    Blood pressure controlled. Off Coreg 12.573mbid         SYSTOLIC HEART FAILURE, CHRONIC (Chronic)    Compensated clinically. Trace ankle edema.       Anemia of chronic disease (Chronic)    5/27/6-5/31/16 hospital-2 units of PRBC transfusion. 12/17/14 Hgb 10.1 12/25/14 Hgb 10.3        Pancreatic cancer (Chronic)    09/17/14 Dr. EnMarin Olpcontinue Xeloda. Scan 3 mos. CA 19-9 to assess for response.  01/08/15 dc XeGlynis Smiles per oncology 01/28/15 Hospice service, Morphine, Oxycodone, Lorazepam, Tylenol available to him for comfort measures.        FUO (fever of unknown origin) (Chronic)    05/28/14 CXR right lower lobe bronchopneumonia.  01/21/15 T 103.5-resolved, 10 day course of Augmentin 87552mid started, negative urine culture 01/23/15. 01/22/15 CXR small inflammatory process identified in the right lower lung.       DVT (deep venous thrombosis) (Chronic)    01/08/15 decreased Eliquis to 2.5mg62m.        GERD    Takes ranitidine 150mg41m. Stable.         Insomnia    C/o sleeps too much, change Temazepam 15mg 1mto prn and Lorazepam 0.5mg qh80mor sleep. The patient stated still cannot sleep at night-improved since 08/13/14 Mirtazapine 7.5mg po 76m.  Stable, continue Mirtazapine 7.5mg nigh42m and prn Temazepam 15mg hs a40morazepam 1mg q6h pr41m      Constipation    Stable, takes Colace 100mg bid, M2max bid, DulcoLax suppository nightly prn        Left shoulder pain - Primary    Sustained from fall 01/18/15, X-ray left shoulder AP and lateral. 01/24/15 X-ray left shoulder: minor cortical irregularity is noted in the bicipital groove region, no definite acute bony abnormalities left shoulder can be identified. Comfort measures.  Review of Systems:  Review of Systems  Constitutional: Positive for fever and fatigue. Negative for chills, diaphoresis, activity change and appetite change.       Resolved spiking T 103.5 01/21/15  HENT: Positive for hearing loss. Negative for congestion, ear discharge, ear pain, nosebleeds, sore throat and tinnitus.   Eyes: Negative for photophobia, pain, discharge and redness.  Respiratory: Negative for cough, shortness of breath, wheezing and stridor.   Cardiovascular: Positive for leg swelling. Negative for chest pain and palpitations.       Trace ankles.   Gastrointestinal: Negative for nausea, vomiting, abdominal pain, diarrhea, constipation and blood in stool.   Endocrine: Negative for polydipsia.  Genitourinary: Positive for frequency. Negative for dysuria, urgency, hematuria and flank pain.  Musculoskeletal: Positive for arthralgias and gait problem. Negative for myalgias, back pain and neck pain.       Left shoulder pain  Skin: Negative for rash.       Left great toe nail came off R upper chest Pot A Cath intact.  The left 3rd MCP swelling, tenderness, and warmth  Allergic/Immunologic: Negative for environmental allergies.  Neurological: Negative for dizziness, tremors, seizures, weakness and headaches.  Hematological: Does not bruise/bleed easily.  Psychiatric/Behavioral: Negative for suicidal ideas and hallucinations. The patient is not nervous/anxious.      Past Medical History  Diagnosis Date  . Diverticulosis of colon (without mention of hemorrhage)   . Irritable bowel syndrome   . Personal history of malignant neoplasm of prostate     s/p  radioactive seed implants 2000  . Unspecified hereditary and idiopathic peripheral neuropathy   . Nonischemic cardiomyopathy cardiologist-  dr mclean    EF 25%  per last echo 2012 and cardiologist note--  2007-  ef  40-45%;  2008- ef 35-40%;  2010- ef 30-35%;  2012- ef 25%  . Sinus bradycardia   . Hypertension   . Arthritis     hands  . Hyperlipidemia   . History of DVT of lower extremity     2002  ,   left leg  . History of adenomatous polyp of colon     2001  . History of basal cell carcinoma excision   . Anticoagulated on Coumadin   . Type 2 diabetes, diet controlled   . GERD (gastroesophageal reflux disease)   . LBBB (left bundle branch block)   . Wears glasses   . Wears hearing aid     BILATERAL  . Unsteady gait   . DDD (degenerative disc disease)     cervical and lumbar  . DJD (degenerative joint disease)     hips  . Lower urinary tract symptoms (LUTS)   . Bladder cancer dx  march 2014  papillary urothelial carcinoma    s/p turbt with chemo instillation (urologist--  dr  dahlstedt)  . Coronary artery disease     per cath 2002  non-obstructive cad  . Chronic systolic heart failure   . H/O fracture of nose     12-30-2013-  pt fell at home open nose bone fx  . Abdominal pain 12/20/2013  . Abnormal MRI of abdomen 01/31/2014  . Acute encephalopathy 07/02/2014  . Acute thromboembolism of deep veins of lower extremity 05/03/2007    Qualifier: History of  By: Reynolds LPN, Megan  05/04/14 Doppler LLE: DVT at left femoral and popliteal veins not completely occlusive. Will anticoagulate with Eliquis 2.5mg bid.    . Anemia of chronic disease 02/20/2013  . CARDIOMYOPATHY 07/06/2007      Qualifier: Diagnosis of  By: Lenna Gilford MD, Deborra Medina   . Constipation 04/04/2014  . Coronary atherosclerosis 07/06/2007    Qualifier: Diagnosis of  By: Lenna Gilford MD, [place] DISEASE 05/03/2007    Qualifier: Diagnosis of  By: Doy Mince LPN, Megan    . DVT (deep venous thrombosis)   . Edema 05/28/2014  . Failure to thrive in adult 03/30/2014  . Fall in elderly patient 12/30/2013  . Fever 07/20/2014  . Frailty 03/30/2014  . FUO (fever of unknown origin) 05/28/2014    05/28/14 CXR right lower lobe bronchopneumonia.    Marland Kitchen GERD 05/03/2007    Qualifier: Diagnosis of  By: Doy Mince LPN, Megan    . Hereditary and idiopathic peripheral neuropathy 07/06/2007    Qualifier: Diagnosis of  By: Lenna Gilford MD, Deborra Medina   . HYPERLIPIDEMIA 05/03/2007    Qualifier: Diagnosis of  By: Doy Mince LPN, Megan    . HYPERTENSION, BENIGN 06/23/2010    Qualifier: Diagnosis of  By: Olevia Perches, MD, Glenetta Hew   . Insomnia 04/04/2014  . Liver metastasis 04/01/2014  . Malnutrition of moderate degree 03/30/2014    05/01/14 albumin 2.8 05/29/14 albumin 3.0   . Metastatic cancer 03/29/2014  . Normocytic anemia 03/30/2014    04/10/14 Hgb 9.3   . Pancreatic cancer 04/06/2014    06/06/14 Oncology: stage IV pancreatic cancer with Mets to liver: CA 19-9 400--194. CT showed more liver metastases. Xeloda 1558m bid x 14days on  and 7 days off. 3 cycles of tx and then repeat the scans.    . Pressure ulcer stage II 08/15/2014    08/14/14 L scrotum small PU-not infected-tx initiated.    . Protein-calorie malnutrition, severe 07/05/2014  . SPINAL STENOSIS, LUMBAR 07/06/2007    Qualifier: Diagnosis of  By: NLenna GilfordMD, SDeborra Medina  . Streptococcal bacteremia 07/23/2014  . Transaminitis 03/30/2014    04/10/14 Alkaline phosphatase 126(39-117) 05/01/14 AST 33, ALT 45, alk  Phos 75 05/29/14 AST 64, ALT 70 alk phos 209   . Type 2 diabetes mellitus 05/03/2007    Qualifier: Diagnosis of  By: RDoy MinceLPN, Megan  197/67/34Hgb A1c 7.1    . Weakness generalized 04/02/2014  . H/O calcium pyrophosphate deposition disease (CPPD) 12/14/2014   Past Surgical History  Procedure Laterality Date  . Knee arthroscopy Left 1991  . Ercp with sphincterotomy and stone removal  05/ 1993  &  05-11-2006  . Total knee arthroplasty  10/05/2011    Procedure: TOTAL KNEE ARTHROPLASTY;  Surgeon: FGearlean Alf MD;  Location: [facility];  Service: Orthopedics;  Laterality: Right;  . Transurethral resection of bladder tumor N/A 09/30/2012    Procedure: TRANSURETHRAL RESECTION OF BLADDER TUMOR (TURBT);  Surgeon: SFranchot Gallo MD;  Location: [facility];  Service: Urology;  Laterality: N/A;  WITH MITOMYCIN INSTILLATION   . Eus N/A 01/04/2014    Procedure: ESOPHAGEAL ENDOSCOPIC ULTRASOUND (EUS) RADIAL;  Surgeon: DMilus Banister MD;  Location: [facility];  Service: Endoscopy;  Laterality: N/A;  . Radioactive prostate seed implants  01/ 2000  . Mohs surgery  03/ 2009    LEFT EAR BASAL CELL CARCINOMA  . Tonsillectomy  AS CHILD  . Cholecystectomy open  1985  . Appendectomy  child  . Inguinal hernia repair Bilateral 1998    recurrent RIH  w/ repair 06-21-2006  . Total knee arthroplasty Left 02-21-2007    AND 04-21-2007  CLOSED MANIPULATION  . Cataract extraction w/ intraocular lens implant Right 2004  . Lumbar  spine surgery  X2  . Cardiac catheterization  06-14-2001   dr bruce brodie    non-obstructive CAD/  LM 30%,  pLAD 50%,  mLAD 30%,  RCA  &  CFX  irregularites ,  global hypokinesis,  ef 40%  . Cardiovascular stress test  02-07-2007   dr bruce brodie    negative for ischemia,  low risk myoview perfusion study  . Transthoracic echocardiogram  01-28-2011    moderate LVH/  ef 25%/  diffuse hypokinesis/  mild LAE/  mild TR/  trivial AR & MR  . Cystoscopy w/ retrogrades Bilateral 03/09/2014    Procedure: CYSTOSCOPY WITH BILATERAL RETROGRADE PYELOGRAM BLADDER BX AND RENAL WASHINGS.;  Surgeon: Stephen M Dahlstedt, MD;  Location: [facility];  Service: Urology;  Laterality: Bilateral;  . Eus N/A 04/05/2014    Procedure: UPPER ENDOSCOPIC ULTRASOUND (EUS) LINEAR;  Surgeon: Daniel P Jacobs, MD;  Location: [facility];  Service: Endoscopy;  Laterality: N/A;  radial linear  . Endoscopic retrograde cholangiopancreatography (ercp) with propofol N/A 04/12/2014    Procedure: ENDOSCOPIC RETROGRADE CHOLANGIOPANCREATOGRAPHY (ERCP) WITH PROPOFOL;  Surgeon: Daniel P Jacobs, MD;  Location: [facility];  Service: Endoscopy;  Laterality: N/A;  metal stent  . Biliary stent placement N/A 04/12/2014    Procedure: BILIARY STENT PLACEMENT;  Surgeon: Daniel P Jacobs, MD;  Location: [facility];  Service: Endoscopy;  Laterality: N/A;   Social History:   reports that he quit smoking about 32 years ago. His smoking use included Cigarettes. He started smoking about 60 years ago. He has a 10 pack-year smoking history. He has never used smokeless tobacco. He reports that he does not drink alcohol or use illicit drugs.  Family History  Problem Relation Age of Onset  . Throat cancer Brother   . Coronary artery disease Father     deceased    Medications: Patient's Medications  New Prescriptions   No medications on file  Previous Medications   ACETAMINOPHEN (TYLENOL) 325 MG TABLET    Take by mouth every 6 (six) hours as needed for fever (For fever greater than 101. Give  one dose).   APIXABAN (ELIQUIS) 2.5 MG TABS TABLET    Take 1 tablet (2.5 mg total) by mouth 2 (two) times daily.   BISACODYL (DULCOLAX) 10 MG SUPPOSITORY    Place 1 suppository (10 mg total) rectally daily as needed for moderate constipation.   CAPECITABINE (XELODA) 500 MG TABLET    Take 3 tablets (1,500 mg total) by mouth 2 (two) times daily after a meal. TAKES 3 TABS (1500) MG BID  ///  ON 14 DAYS OFF 7 DAYS FOR 3 CYCLES.   STARTED 06/06/14   DOCUSATE SODIUM 100 MG CAPS    Take 100 mg by mouth 2 (two) times daily.   FEEDING SUPPLEMENT, GLUCERNA SHAKE, (GLUCERNA SHAKE) LIQD    Take 237 mLs by mouth 2 (two) times daily between meals as needed (If pt with <50% meal completion).   HYDROCODONE-ACETAMINOPHEN (NORCO) 10-325 MG PER TABLET    Take one tablet by mouth every 4 hours as needed for pain   INSULIN GLARGINE (LANTUS) 100 UNIT/ML INJECTION    Inject 3 Units into the skin at bedtime.   LACTULOSE (CHRONULAC) 10 GM/15ML SOLUTION    Take 20 g by mouth daily as needed for mild constipation (30ml as needed for constipation).    LIDOCAINE-PRILOCAINE (EMLA) CREAM    Apply 1 application topically 6 (six) times daily. Apply topically every 4 hours as needed for pain     LORAZEPAM (ATIVAN) 0.5 MG TABLET    Take 2 tablets (1 mg total) by mouth every 6 (six) hours as needed (nausea/vomiting).   METHYLPREDNISOLONE (MEDROL DOSEPAK) 4 MG TBPK TABLET    Take as directed on package.   MIRTAZAPINE (REMERON) 7.5 MG TABLET    Take 7.5 mg by mouth at bedtime.   MORPHINE (ROXANOL) 20 MG/ML CONCENTRATED SOLUTION    Take 5 mg by mouth every 2 (two) hours as needed for severe pain (Give 0.24m=5mg).   MULTIPLE VITAMIN (MULTIVITAMIN WITH MINERALS) TABS TABLET    Take 1 tablet by mouth daily.   MULTIPLE VITAMINS-MINERALS (PRESERVISION AREDS 2) CAPS    Take 1 capsule by mouth 2 (two) times daily.   NAPROXEN SODIUM (ANAPROX) 220 MG TABLET    Take 220 mg by mouth 2 (two) times daily with a meal.   NITROGLYCERIN (NITROSTAT) 0.4 MG  SL TABLET    Place 1 tablet (0.4 mg total) under the tongue every 5 (five) minutes as needed for chest pain.   OMEGA-3 FATTY ACIDS (FISH OIL) 1000 MG CAPS    Take 1 capsule by mouth.   ONDANSETRON (ZOFRAN) 8 MG TABLET    Take by mouth 2 (two) times daily as needed for nausea or vomiting.    OXYCODONE (OXY IR/ROXICODONE) 5 MG IMMEDIATE RELEASE TABLET    Take one tablet by mouth twice daily for pain   POLYETHYLENE GLYCOL (MIRALAX / GLYCOLAX) PACKET    Take 17 g by mouth 2 (two) times daily.   POLYVINYL ALCOHOL (LIQUIFILM TEARS) 1.4 % OPHTHALMIC SOLUTION    Place 1 drop into both eyes 4 (four) times daily as needed for dry eyes.   PRAVASTATIN (PRAVACHOL) 40 MG TABLET    Take 40 mg by mouth every evening.    PREGABALIN (LYRICA) 75 MG CAPSULE    Take 75 mg by mouth daily.   PROCHLORPERAZINE (COMPAZINE) 5 MG TABLET    Take 5 mg by mouth every 6 (six) hours as needed for nausea or vomiting.   RANITIDINE (ZANTAC) 150 MG TABLET    Take 150 mg by mouth 2 (two) times daily.   TEMAZEPAM (RESTORIL) 15 MG CAPSULE    Take 15 mg by mouth at bedtime.   Modified Medications   No medications on file  Discontinued Medications   No medications on file     Physical Exam: Physical Exam  Constitutional: He is oriented to person, place, and time. He appears well-developed and well-nourished.  HENT:  Head: Normocephalic and atraumatic.  Mouth/Throat: Oropharynx is clear and moist.  Eyes: Conjunctivae and EOM are normal. Pupils are equal, round, and reactive to light. Right eye exhibits no discharge. Left eye exhibits no discharge. No scleral icterus.  Neck: Normal range of motion. Neck supple. No JVD present. No tracheal deviation present. No thyromegaly present.  Cardiovascular: Normal rate, regular rhythm and normal heart sounds.   Pulmonary/Chest: Effort normal and breath sounds normal. No stridor. He has no wheezes. He has no rales. He exhibits no tenderness.  Abdominal: Soft. Bowel sounds are normal. He  exhibits no distension. There is no tenderness. There is no rebound and no guarding.  Minimal epigastric tenderness.  No ascites  Musculoskeletal: Normal range of motion. He exhibits edema and tenderness.  LLE trace edema Left shoulder pan with ROM  Lymphadenopathy:    He has no cervical adenopathy.  Neurological: He is alert and oriented to person, place, and time. No cranial nerve deficit.  Skin: Skin is warm and dry.  No rash noted. No erythema. No pallor.  The left 3rd MCP swelling, tenderness, and warmth  Psychiatric: He has a normal mood and affect. His behavior is normal.    Filed Vitals:   01/23/15 1749  BP: 102/50  Pulse: 62  Temp: 97 F (36.1 C)  TempSrc: Tympanic  Resp: 16      Labs reviewed: Basic Metabolic Panel:  Recent Labs  05/29/14  07/20/14 0500  12/14/14 1429 12/15/14 0335 12/16/14 0425 12/25/14 01/08/15 0925  NA 136*  < > 137  < > 138 136 137 136* 134*  K 4.3  < > 4.0  < > 4.4 4.3 4.3 4.6 4.1  CL  --   < > 103  < > 100* 103 104  --   --   CO2  --   < > 27  < > 31 27 26  --  28  GLUCOSE  --   < > 122*  < > 148* 148* 273*  --  175*  BUN 22*  < > 21  < > 17 14 22* 25* 23.8  CREATININE 1.0  < > 0.99  < > 0.72 0.73 0.99 0.9 1.0  CALCIUM  --   < > 8.8  < > 8.6* 8.0* 8.0*  --  10.1  TSH 1.15  --  0.846  --   --   --   --   --   --   < > = values in this interval not displayed. Liver Function Tests:  Recent Labs  12/15/14 0335 12/16/14 0425 12/25/14 01/08/15 0925  AST 171* 57* 12* 14  ALT 79* 44 13 10  ALKPHOS 510* 342* 210* 174*  BILITOT 4.4* 0.9  --  1.10  PROT 6.0* 5.4*  --  7.6  ALBUMIN 2.6* 2.2*  --  2.9*    Recent Labs  03/29/14 1610 07/02/14 1845  LIPASE 10* 7*    Recent Labs  12/14/14 1820  AMMONIA 16   CBC:  Recent Labs  11/05/14 1043  11/26/14 1155  12/15/14 0335 12/16/14 0425 12/17/14 2112 12/25/14 01/08/15 0835  WBC 6.3  --  4.9  < > 7.1 7.2  --  4.8 8.3  NEUTROABS 4.4  --  3.4  --   --   --   --   --  5.1  HGB  9.4*  < > 9.9*  < > 8.4* 8.4* 10.1* 10.3* 11.9*  HCT 28.4*  < > 29.6*  < > 25.9* 25.8* 31.3* 31* 35.1*  MCV 108*  --  108*  < > 107.0* 107.5*  --   --  99*  PLT 227  < > 219  < > 212 253  --  191 340  < > = values in this interval not displayed. Lipid Panel: No results for input(s): CHOL, HDL, LDLCALC, TRIG, CHOLHDL, LDLDIRECT in the last 8760 hours.  Past Procedures:  03/29/14 CT abd and pelvis w CM:  IMPRESSION:  4.9 cm enhancing lesion adjacent to the pancreatic head/uncinate  process, worrisome for primary pancreatic adenocarcinoma,  progressed.  Tumor likely involves the SMA/SMV and undersurface of the portal  vein. 9 mm short axis node in the porta hepatitis.  Suspected multifocal hepatic metastases, including a 3.2 cm lesion  in the right hepatic dome.   03/30/14 MR w wo CM abd:   IMPRESSION:  1. Subtle amorphous mass in the pancreatic head and proximal  pancreatic body currently measuring 4.4 x 3.2 cm, causing distal  common bile   duct obstruction and demonstrating vascular involvement  (SMA, SMV, splenic vein, splenic portal confluence and proximal  portal vein), as above, highly concerning for primary pancreatic  neoplasm.  2. Although there do appear to be multiple ill-defined hepatic  lesions, concerning for metastatic disease, these were poorly  evaluated on today's examination secondary to patient respiratory  motion. Continued attention on any future followup studies is  recommended, as metastatic disease to the liver is suspected.  3. Severe intra and extrahepatic biliary ductal dilatation.   05/04/14 Doppler LLE: DVT at left femoral and popliteal veins not completely occlusive. Will anticoagulate with Eliquis 2.45m bid.    07/20/14 CT abd and pelvis:   IMPRESSION: 1. New tiny pleural effusions with adjacent atelectasis/ infiltrate. 2. Stable poorly marginated hepatic lesions suggesting metastatic disease. 3. Little change in pancreatic  mass with patent metallic biliary stent and pneumobilia.  11/26/14 CT abd and pelvis with contrast:  1 no definite new metastatic lesions in the liver and lack of visualization of the previously treated metastatic lesions in the liver 2 small area of hypervascularity adjacent to the gallbladder fossa in the liver is favored to represent a perfusion anomaly.  3 persistent mass like thickening of the distal rectum immedialtely above the anorectal junction-concerning for potential neoplasm.  4 extensive colonic diverticulosis  5 patent stent in the common bile duct  11/28/14 X-ray Left hand: no acute dislocation or fracture in the left hand.   12/14/14 CT head wo contrast:  IMPRESSION: No acute findings. Stable atrophy and small vessel disease  12/14/14 CT abd and pelvis w contrast:   IMPRESSION: 1. With respect to the patient's pancreatic malignancy findings on today's are exam are not significantly changed compared with previous study. There is a lesion within the dome of liver which is unchanged from 09/17/2014 in may represent an area of liver metastasis. This was not well seen on study from 11/26/2014. 2. Biliary dilatation and pneumobilia. The degree of intrahepatic bile duct dilatation is not significantly changed. There has been mild increase caliber of the common bile duct. 3. New splenomegaly and ascites. Findings may reflect changes of portal venous hypertension secondary to patient's worsening liver function. 4. Increase in volume of bilateral pleural effusions.  12/14/14 CXR  IMPRESSION: Increased central pulmonary vascular congestion is noted with probable bilateral pulmonary edema.  01/24/15 X-ray left shoulder: minor cortical irregularity is noted in the bicipital groove region, no definite acute bony abnormalities left shoulder can be identified.    Assessment/Plan Left shoulder pain Sustained from fall 01/18/15, X-ray left shoulder AP and lateral. 01/24/15 X-ray  left shoulder: minor cortical irregularity is noted in the bicipital groove region, no definite acute bony abnormalities left shoulder can be identified. Comfort measures.   FUO (fever of unknown origin) 05/28/14 CXR right lower lobe bronchopneumonia.  01/21/15 T 103.5-resolved, 10 day course of Augmentin 8748mbid started, negative urine culture 01/23/15. 01/22/15 CXR small inflammatory process identified in the right lower lung.   Type 2 diabetes mellitus 12/20/13 Hgb A1c 7.3 04/09/14 CBG am 150-180, pm 186/235-adding Lantus 3 units qd qhs.  05/28/14 update Hgb A1c 7.1 -- Continue Lantus 3 units daily.    Neuropathy due to type 2 diabetes mellitus Takes Pregabalin 7571maily. Ambulates with walker on unit   HYPERTENSION, BENIGN Blood pressure controlled. Off Coreg 12.37.1GGd     SYSTOLIC HEART FAILURE, CHRONIC Compensated clinically. Trace ankle edema.   Anemia of chronic disease 5/27/6-5/31/16 hospital-2 units of PRBC transfusion. 12/17/14 Hgb 10.1 12/25/14  Hgb 10.3    DVT (deep venous thrombosis) 01/08/15 decreased Eliquis to 2.5mg id.    GERD Takes ranitidine 150mg bid. Stable.     Insomnia C/o sleeps too much, change Temazepam 15mg qhs to prn and Lorazepam 0.5mg qhs for sleep. The patient stated still cannot sleep at night-improved since 08/13/14 Mirtazapine 7.5mg po qhs.  Stable, continue Mirtazapine 7.5mg nightly and prn Temazepam 15mg hs and Lorazepam 1mg q6h prn.   Constipation Stable, takes Colace 100mg bid, MiraLax bid, DulcoLax suppository nightly prn    Pancreatic cancer 09/17/14 Dr. Ennever: continue Xeloda. Scan 3 mos. CA 19-9 to assess for response.  01/08/15 dc Xerloda, per oncology 01/28/15 Hospice service, Morphine, Oxycodone, Lorazepam, Tylenol available to him for comfort measures.      Family/ Staff Communication: observe the patient  Goals of Care: SNF  Labs/tests ordered: X-ray AP lateral left shoulder.   

## 2015-01-25 ENCOUNTER — Telehealth: Payer: Self-pay | Admitting: *Deleted

## 2015-01-25 NOTE — Telephone Encounter (Signed)
Patient is now a hospice patient. Hospice RN called to let us know that he and his family have chosen not to have scans done as scheduled next week. Appointments cancelled and Dr Marin Olp notified.

## 2015-01-28 ENCOUNTER — Ambulatory Visit (HOSPITAL_BASED_OUTPATIENT_CLINIC_OR_DEPARTMENT_OTHER)

## 2015-01-28 ENCOUNTER — Encounter: Payer: Self-pay | Admitting: Nurse Practitioner

## 2015-01-28 NOTE — Assessment & Plan Note (Signed)
Takes Pregabalin 75mg  daily. Ambulates with walker on unit

## 2015-01-28 NOTE — Assessment & Plan Note (Signed)
12/20/13 Hgb A1c 7.3 04/09/14 CBG am 150-180, pm 186/235-adding Lantus 3 units qd qhs.  05/28/14 update Hgb A1c 7.1 -- Continue Lantus 3 units daily.

## 2015-01-28 NOTE — Assessment & Plan Note (Signed)
09/17/14 Dr. Marin Olp: continue Xeloda. Scan 3 mos. CA 19-9 to assess for response.  01/08/15 dc [name], per oncology 01/28/15 Hospice service, Morphine, Oxycodone, Lorazepam, Tylenol available to him for comfort measures.

## 2015-01-28 NOTE — Assessment & Plan Note (Signed)
01/08/15 decreased Eliquis to 2.5mg  id.

## 2015-01-28 NOTE — Assessment & Plan Note (Signed)
5/27/6-5/31/16 hospital-2 units of PRBC transfusion. 12/17/14 Hgb 10.1 12/25/14 Hgb 10.3

## 2015-01-28 NOTE — Assessment & Plan Note (Signed)
Compensated clinically. Trace ankle edema.

## 2015-01-28 NOTE — Assessment & Plan Note (Signed)
Stable, takes Colace 100mg  bid, MiraLax bid, DulcoLax suppository nightly prn

## 2015-01-28 NOTE — Assessment & Plan Note (Signed)
C/o sleeps too much, change Temazepam 15mg  qhs to prn and Lorazepam 0.5mg  qhs for sleep. The patient stated still cannot sleep at night-improved since 08/13/14 Mirtazapine 7.5mg  po qhs.  Stable, continue Mirtazapine 7.5mg  nightly and prn Temazepam 15mg  hs and Lorazepam 1mg  q6h prn.

## 2015-01-28 NOTE — Assessment & Plan Note (Signed)
Blood pressure controlled. Off Coreg 12.5mg  bid

## 2015-01-28 NOTE — Assessment & Plan Note (Signed)
Takes ranitidine 150mg  bid. Stable.

## 2015-01-29 ENCOUNTER — Ambulatory Visit: Payer: Medicare Other

## 2015-01-29 ENCOUNTER — Other Ambulatory Visit: Payer: Medicare Other

## 2015-01-29 ENCOUNTER — Ambulatory Visit: Payer: Medicare Other | Admitting: Hematology & Oncology

## 2015-03-20 ENCOUNTER — Encounter: Payer: Self-pay | Admitting: Nurse Practitioner

## 2015-03-20 ENCOUNTER — Non-Acute Institutional Stay (SKILLED_NURSING_FACILITY): Payer: Medicare Other | Admitting: Nurse Practitioner

## 2015-03-20 DIAGNOSIS — E118 Type 2 diabetes mellitus with unspecified complications: Secondary | ICD-10-CM

## 2015-03-20 DIAGNOSIS — I10 Essential (primary) hypertension: Secondary | ICD-10-CM | POA: Diagnosis not present

## 2015-03-20 DIAGNOSIS — C259 Malignant neoplasm of pancreas, unspecified: Secondary | ICD-10-CM

## 2015-03-20 DIAGNOSIS — L89154 Pressure ulcer of sacral region, stage 4: Secondary | ICD-10-CM | POA: Insufficient documentation

## 2015-03-20 NOTE — Progress Notes (Signed)
Patient ID: [name] [name], male   DOB: Dec 16, 1922, 81 y.o.   MRN: [id number]  Location:  SNF [facility] Provider:  Marlana Latus NP  Code Status:  DNR Goals of care: Advanced Directive information    Chief Complaint  Patient presents with  . Acute Visit    end of life care     HPI: Patient is a 81 y.o. male seen in the SNF at [facility] today for evaluation of end of life care, Hx of metastatic pancreatic cancer, underwent oncology tx. The patient is declining gradually. He sleeps comfortably in bed, has difficulty of swallowing except small amount of Morphine liquid administered. His goal of care is comfort measures under Hospice service.    Review of Systems:  Review of Systems  Constitutional: Positive for weight loss and malaise/fatigue.  Respiratory: Negative for cough.   Cardiovascular: Negative for leg swelling.  Gastrointestinal: Positive for abdominal pain.  Musculoskeletal:       Bedrest  Skin:       Sacral stage 4 wounds  Neurological: Positive for loss of consciousness and weakness.    Past Medical History  Diagnosis Date  . Diverticulosis of colon (without mention of hemorrhage)   . Irritable bowel syndrome   . Personal history of malignant neoplasm of prostate     s/p  radioactive seed implants 2000  . Unspecified hereditary and idiopathic peripheral neuropathy   . Nonischemic cardiomyopathy cardiologist-  dr Aundra Dubin    EF 25%  per last echo 2012 and cardiologist note--  2007-  ef  40-45%;  2008- ef 35-40%;  2010- ef 30-35%;  2012- ef 25%  . Sinus bradycardia   . Hypertension   . Arthritis     hands  . Hyperlipidemia   . History of DVT of lower extremity     2002  ,   left leg  . History of adenomatous polyp of colon     2001  . History of basal cell carcinoma excision   . Anticoagulated on Coumadin   . Type 2 diabetes, diet controlled   . GERD (gastroesophageal reflux disease)   . LBBB (left bundle branch block)   . Wears glasses   . Wears hearing  aid     BILATERAL  . Unsteady gait   . DDD (degenerative disc disease)     cervical and lumbar  . DJD (degenerative joint disease)     hips  . Lower urinary tract symptoms (LUTS)   . 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 Coronary artery disease     per cath 2002  non-obstructive cad  . Chronic systolic heart failure   . H/O fracture of nose     12-30-2013-  pt fell at home open nose bone fx  . Abdominal pain 12/20/2013  . Abnormal MRI of abdomen 01/31/2014  . Acute encephalopathy 07/02/2014  . Acute thromboembolism of deep veins of lower extremity 05/03/2007    Qualifier: History of  By: Doy Mince LPN, Megan  48/25/00 Doppler LLE: DVT at left femoral and popliteal veins not completely occlusive. Will anticoagulate with Eliquis 2.53m bid.    . Anemia of chronic disease 02/20/2013  . CARDIOMYOPATHY 07/06/2007    Qualifier: Diagnosis of  By: NLenna GilfordMD, SDeborra Medina  . Constipation 04/04/2014  . Coronary atherosclerosis 07/06/2007    Qualifier: Diagnosis of  By: NLenna GilfordMD, SDeborra Medina  .  DEGENERATIVE JOINT DISEASE 05/03/2007    Qualifier: Diagnosis of  By: Doy Mince LPN, Megan    . DVT (deep venous thrombosis)   . Edema 05/28/2014  . Failure to thrive in adult 03/30/2014  . Fall in elderly patient 12/30/2013  . Fever 07/20/2014  . Frailty 03/30/2014  . FUO (fever of unknown origin) 05/28/2014    05/28/14 CXR right lower lobe bronchopneumonia.    Marland Kitchen GERD 05/03/2007    Qualifier: Diagnosis of  By: Doy Mince LPN, Megan    . Hereditary and idiopathic peripheral neuropathy 07/06/2007    Qualifier: Diagnosis of  By: Lenna Gilford MD, Deborra Medina   . HYPERLIPIDEMIA 05/03/2007    Qualifier: Diagnosis of  By: Doy Mince LPN, Megan    . HYPERTENSION, BENIGN 06/23/2010    Qualifier: Diagnosis of  By: Olevia Perches, MD, Glenetta Hew   . Insomnia 04/04/2014  . Liver metastasis 04/01/2014  . Malnutrition of moderate degree 03/30/2014     05/01/14 albumin 2.8 05/29/14 albumin 3.0   . Metastatic cancer 03/29/2014  . Normocytic anemia 03/30/2014    04/10/14 Hgb 9.3   . Pancreatic cancer 04/06/2014    06/06/14 Oncology: stage IV pancreatic cancer with Mets to liver: CA 19-9 400--194. CT showed more liver metastases. Xeloda 1573m bid x 14days on and 7 days off. 3 cycles of tx and then repeat the scans.    . Pressure ulcer stage II 08/15/2014    08/14/14 L scrotum small PU-not infected-tx initiated.    . Protein-calorie malnutrition, severe 07/05/2014  . SPINAL STENOSIS, LUMBAR 07/06/2007    Qualifier: Diagnosis of  By: NLenna GilfordMD, SDeborra Medina  . Streptococcal bacteremia 07/23/2014  . Transaminitis 03/30/2014    04/10/14 Alkaline phosphatase 126(39-117) 05/01/14 AST 33, ALT 45, alk  Phos 75 05/29/14 AST 64, ALT 70 alk phos 209   . Type 2 diabetes mellitus 05/03/2007    Qualifier: Diagnosis of  By: RDoy MinceLPN, Megan  178/29/56Hgb A1c 7.1    . Weakness generalized 04/02/2014  . H/O calcium pyrophosphate deposition disease (CPPD) 12/14/2014    Patient Active Problem List   Diagnosis Date Noted  . Decubitus ulcer of sacral region, stage 4 03/20/2015  . Left shoulder pain 01/23/2015  . Infection of urinary tract 01/09/2015  . Left hand pain 12/28/2014  . SIRS (systemic inflammatory response syndrome) 12/14/2014  . H/O calcium pyrophosphate deposition disease (CPPD) 12/14/2014  . Cellulitis of great toe of right foot 10/01/2014  . Decubitus ulcer of sacral region, stage 2 08/15/2014  . Streptococcal bacteremia 07/23/2014  . DVT (deep venous thrombosis)   . Protein-calorie malnutrition, severe 07/05/2014  . Acute encephalopathy 07/02/2014  . Edema 05/28/2014  . FUO (fever of unknown origin) 05/28/2014  . Pancreatic cancer 04/06/2014  . Insomnia 04/04/2014  . Constipation 04/04/2014  . Palliative care encounter 04/02/2014  . Weakness generalized 04/02/2014  . Liver metastasis 04/01/2014  . Failure to thrive in adult 03/30/2014  .  Frailty 03/30/2014  . Malnutrition of moderate degree 03/30/2014  . Transaminitis 03/30/2014  . Normocytic anemia 03/30/2014  . Malignant neoplasm of bladder, part unspecified 03/09/2014  . Abnormal MRI of abdomen 01/31/2014  . Fall in elderly patient 12/30/2013  . Abdominal pain 12/20/2013  . Encounter for therapeutic drug monitoring 08/18/2013  . Bladder cancer 02/20/2013  . Anemia of chronic disease 02/20/2013  . OA (osteoarthritis) of knee 10/05/2011  . PVC's (premature ventricular contractions) 10/05/2011  . HYPERTENSION, BENIGN 06/23/2010  . SYSTOLIC HEART FAILURE, CHRONIC 11/20/2008  . Hereditary  and idiopathic peripheral neuropathy 07/06/2007  . Coronary atherosclerosis 07/06/2007  . CARDIOMYOPATHY 07/06/2007  . LBBB 07/06/2007  . DIVERTICULOSIS, COLON 07/06/2007  . SPINAL STENOSIS, LUMBAR 07/06/2007  . Type 2 diabetes mellitus 05/03/2007  . Neuropathy due to type 2 diabetes mellitus 05/03/2007  . Acute thromboembolism of deep veins of lower extremity 05/03/2007  . GERD 05/03/2007  . IBS 05/03/2007  . DEGENERATIVE JOINT DISEASE 05/03/2007  . PROSTATE CANCER, HX OF 05/03/2007    No Known Allergies  Medications: Patient's Medications  New Prescriptions   No medications on file  Previous Medications   ACETAMINOPHEN (TYLENOL) 325 MG TABLET    Take by mouth every 6 (six) hours as needed for fever (For fever greater than 101. Give one dose).   APIXABAN (ELIQUIS) 2.5 MG TABS TABLET    Take 1 tablet (2.5 mg total) by mouth 2 (two) times daily.   BISACODYL (DULCOLAX) 10 MG SUPPOSITORY    Place 1 suppository (10 mg total) rectally daily as needed for moderate constipation.   CAPECITABINE (XELODA) 500 MG TABLET    Take 3 tablets (1,500 mg total) by mouth 2 (two) times daily after a meal. TAKES 3 TABS (1500) MG BID  ///  ON 14 DAYS OFF 7 DAYS FOR 3 CYCLES.   STARTED 06/06/14   DOCUSATE SODIUM 100 MG CAPS    Take 100 mg by mouth 2 (two) times daily.   FEEDING SUPPLEMENT, GLUCERNA  SHAKE, (GLUCERNA SHAKE) LIQD    Take 237 mLs by mouth 2 (two) times daily between meals as needed (If pt with <50% meal completion).   HYDROCODONE-ACETAMINOPHEN (NORCO) 10-325 MG PER TABLET    Take one tablet by mouth every 4 hours as needed for pain   INSULIN GLARGINE (LANTUS) 100 UNIT/ML INJECTION    Inject 3 Units into the skin at bedtime.   LACTULOSE (CHRONULAC) 10 GM/15ML SOLUTION    Take 20 g by mouth daily as needed for mild constipation (28m as needed for constipation).    LIDOCAINE-PRILOCAINE (EMLA) CREAM    Apply 1 application topically 6 (six) times daily. Apply topically every 4 hours as needed for pain   LORAZEPAM (ATIVAN) 0.5 MG TABLET    Take 2 tablets (1 mg total) by mouth every 6 (six) hours as needed (nausea/vomiting).   METHYLPREDNISOLONE (MEDROL DOSEPAK) 4 MG TBPK TABLET    Take as directed on package.   MIRTAZAPINE (REMERON) 7.5 MG TABLET    Take 7.5 mg by mouth at bedtime.   MORPHINE (ROXANOL) 20 MG/ML CONCENTRATED SOLUTION    Take 5 mg by mouth every 2 (two) hours as needed for severe pain (Give 0.224m5mg).   MULTIPLE VITAMIN (MULTIVITAMIN WITH MINERALS) TABS TABLET    Take 1 tablet by mouth daily.   MULTIPLE VITAMINS-MINERALS (PRESERVISION AREDS 2) CAPS    Take 1 capsule by mouth 2 (two) times daily.   NAPROXEN SODIUM (ANAPROX) 220 MG TABLET    Take 220 mg by mouth 2 (two) times daily with a meal.   NITROGLYCERIN (NITROSTAT) 0.4 MG SL TABLET    Place 1 tablet (0.4 mg total) under the tongue every 5 (five) minutes as needed for chest pain.   OMEGA-3 FATTY ACIDS (FISH OIL) 1000 MG CAPS    Take 1 capsule by mouth.   ONDANSETRON (ZOFRAN) 8 MG TABLET    Take by mouth 2 (two) times daily as needed for nausea or vomiting.    OXYCODONE (OXY IR/ROXICODONE) 5 MG IMMEDIATE RELEASE TABLET    Take one  tablet by mouth twice daily for pain   POLYETHYLENE GLYCOL (MIRALAX / GLYCOLAX) PACKET    Take 17 g by mouth 2 (two) times daily.   POLYVINYL ALCOHOL (LIQUIFILM TEARS) 1.4 % OPHTHALMIC  SOLUTION    Place 1 drop into both eyes 4 (four) times daily as needed for dry eyes.   PRAVASTATIN (PRAVACHOL) 40 MG TABLET    Take 40 mg by mouth every evening.    PREGABALIN (LYRICA) 75 MG CAPSULE    Take 75 mg by mouth daily.   PROCHLORPERAZINE (COMPAZINE) 5 MG TABLET    Take 5 mg by mouth every 6 (six) hours as needed for nausea or vomiting.   RANITIDINE (ZANTAC) 150 MG TABLET    Take 150 mg by mouth 2 (two) times daily.   TEMAZEPAM (RESTORIL) 15 MG CAPSULE    Take 15 mg by mouth at bedtime.   Modified Medications   No medications on file  Discontinued Medications   No medications on file    Physical Exam: Filed Vitals:   03/20/15 1240  BP: 90/50  Pulse: 80  Temp: 99.2 F (37.3 C)  TempSrc: Tympanic  Resp: 20   There is no weight on file to calculate BMI.  Physical Exam  Constitutional: No distress.  HENT:  Head: Atraumatic.  Cardiovascular: Normal rate.   No murmur heard. Pulmonary/Chest: Effort normal and breath sounds normal. No respiratory distress. He has no wheezes.  Abdominal: There is tenderness.  Musculoskeletal: Normal range of motion.  Neurological:  Responded to voice.   Skin: Skin is warm and dry. He is not diaphoretic.  Sacral pressure wounds stage 4    Labs reviewed: Basic Metabolic Panel:  Recent Labs  12/14/14 1429 12/15/14 0335 12/16/14 0425 12/25/14 01/08/15 0925  NA 138 136 137 136* 134*  K 4.4 4.3 4.3 4.6 4.1  CL 100* 103 104  --   --   CO2 31 27 26   --  28  GLUCOSE 148* 148* 273*  --  175*  BUN 17 14 22* 25* 23.8  CREATININE 0.72 0.73 0.99 0.9 1.0  CALCIUM 8.6* 8.0* 8.0*  --  10.1    Liver Function Tests:  Recent Labs  12/15/14 0335 12/16/14 0425 12/25/14 01/08/15 0925  AST 171* 57* 12* 14  ALT 79* 44 13 10  ALKPHOS 510* 342* 210* 174*  BILITOT 4.4* 0.9  --  1.10  PROT 6.0* 5.4*  --  7.6  ALBUMIN 2.6* 2.2*  --  2.9*    CBC:  Recent Labs  11/05/14 1043  11/26/14 1155  12/15/14 0335 12/16/14 0425 12/17/14 2112  12/25/14 01/08/15 0835  WBC 6.3  --  4.9  < > 7.1 7.2  --  4.8 8.3  NEUTROABS 4.4  --  3.4  --   --   --   --   --  5.1  HGB 9.4*  < > 9.9*  < > 8.4* 8.4* 10.1* 10.3* 11.9*  HCT 28.4*  < > 29.6*  < > 25.9* 25.8* 31.3* 31* 35.1*  MCV 108*  --  108*  < > 107.0* 107.5*  --   --  99*  PLT 227  < > 219  < > 212 253  --  191 340  < > = values in this interval not displayed.  Lab Results  Component Value Date   TSH 0.846 07/20/2014   Lab Results  Component Value Date   HGBA1C 6.8* 07/20/2014   Lab Results  Component Value Date  CHOL 157 02/23/2013   HDL 48.10 02/23/2013   LDLCALC 83 02/23/2013   LDLDIRECT 147.6 08/23/2008   TRIG 129.0 02/23/2013   CHOLHDL 3 02/23/2013    Significant Diagnostic Results since last visit: none  Patient Care Team: Doe-Hyun Kyra Searles, DO as PCP - General (Internal Medicine) Estill Dooms, MD as PCP - Internal Medicine (Internal Medicine) Franchot Gallo, MD as Consulting Physician (Urology) Volanda Napoleon, MD as Consulting Physician (Oncology) Gaynelle Arabian, MD as Consulting Physician (Orthopedic Surgery) Larey Dresser, MD as Consulting Physician (Cardiology) Gatha Mayer, MD as Consulting Physician (Gastroenterology)  Assessment/Plan Problem List Items Addressed This Visit    Type 2 diabetes mellitus - Primary (Chronic)    Dc lantus and CBG monitoring.       HYPERTENSION, BENIGN (Chronic)    Low measurements.       Pancreatic cancer (Chronic)    Off Chemo tx since 01/08/15. Gradual decline, end of life care, comfort measures, Hospice service, Morphine for pain and comfort purpose. Dc all po meds.       Decubitus ulcer of sacral region, stage 4    Healing is not likely, moist packing for comfort purpose, managing pain with Morphine.           Family/ staff Communication: comfort measures.   Labs/tests ordered: none  ManXie Dimond Crotty NP [facility] [address], [place]  [address] On Call:  [phone] & follow prompts after 5pm & weekends Office Phone:  [phone] Office Fax:  [phone]

## 2015-03-20 NOTE — Assessment & Plan Note (Signed)
Off Chemo tx since 01/08/15. Gradual decline, end of life care, comfort measures, Hospice service, Morphine for pain and comfort purpose. Dc all po meds.

## 2015-03-20 NOTE — Assessment & Plan Note (Signed)
Low measurements 

## 2015-03-20 NOTE — Assessment & Plan Note (Signed)
Dc lantus and CBG monitoring.

## 2015-03-20 NOTE — Assessment & Plan Note (Signed)
Healing is not likely, moist packing for comfort purpose, managing pain with Morphine.

## 2015-04-20 DEATH — deceased

## 2015-09-21 IMAGING — XA IR FLUORO GUIDE CV LINE*R*
1 series · 1 of 1 positions shown · IV contrast (IODINE)
Comparison: None.

INDICATION: History of pancreatic cancer. In need of intravenous access for
chemotherapy administration.

EXAM:
IMPLANTED PORT A CATH PLACEMENT WITH ULTRASOUND AND FLUOROSCOPIC
GUIDANCE

[Series 300: ir fluoro guide cv line*r* · 1 of 1 slices shown]
[im 1/1]
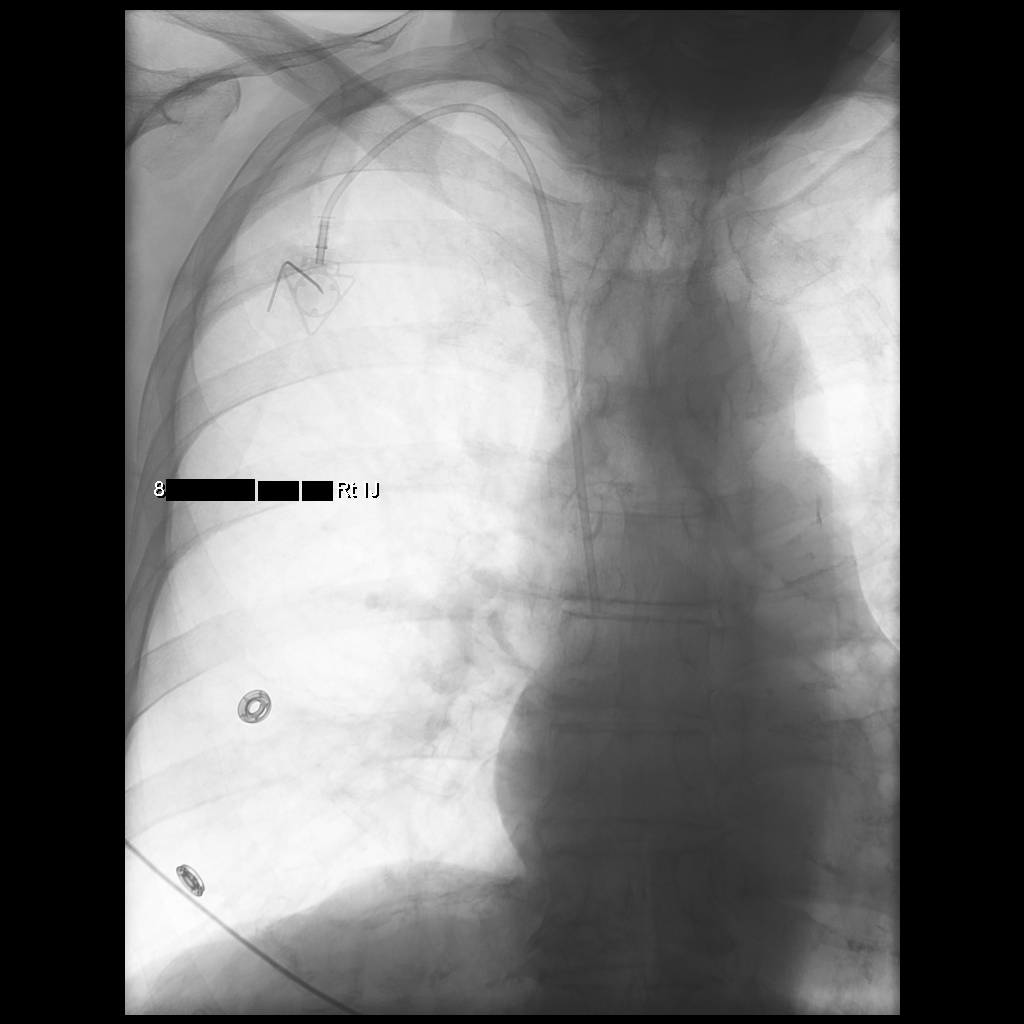

[1 of 1 positions shown; findings below may reference images not displayed]

MEDICATIONS:
Ancef 2 gm IV; The antibiotic was administered within an appropriate
time interval prior to skin puncture.

ANESTHESIA/SEDATION:
Versed 1 mg IV; Fentanyl 25 mcg IV;

Total Moderate Sedation Time

25  minutes.

CONTRAST:  None

FLUOROSCOPY TIME:  12 seconds.

COMPLICATIONS:
None immediate

PROCEDURE:
The procedure, risks, benefits, and alternatives were explained to
the patient. Questions regarding the procedure were encouraged and
answered. The patient understands and consents to the procedure.

The right neck and chest were prepped with chlorhexidine in a
sterile fashion, and a sterile drape was applied covering the
operative field. Maximum barrier sterile technique with sterile
gowns and gloves were used for the procedure. A timeout was
performed prior to the initiation of the procedure. Local anesthesia
was provided with 1% lidocaine with epinephrine.

After creating a small venotomy incision, a micropuncture kit was
utilized to access the internal jugular vein under direct, real-time
ultrasound guidance. Ultrasound image documentation was performed.
The microwire was kinked to measure appropriate catheter length.

A subcutaneous port pocket was then created along the upper chest
wall utilizing a combination of sharp and blunt dissection. The
pocket was irrigated with sterile saline. A single lumen ISP power
injectable port was chosen for placement. The 8 Fr catheter was
tunneled from the port pocket site to the venotomy incision. The
port was placed in the pocket. The external catheter was trimmed to
appropriate length. At the venotomy, an 8 Fr peel-away sheath was
placed over a guidewire under fluoroscopic guidance. The catheter
was then placed through the sheath and the sheath was removed. Final
catheter positioning was confirmed and documented with a
fluoroscopic spot radiograph. The port was accessed with Amonh Reshma
needle, aspirated and flushed with heparinized saline.

The venotomy site was closed with an interrupted 4-0 Vicryl suture.
The port pocket incision was closed with interrupted 2-0 Vicryl
suture and the skin was opposed with a running subcuticular 4-0
Vicryl suture. Dermabond and Omar Marcelo were applied to both
incisions. Dressings were placed. The patient tolerated the
procedure well without immediate post procedural complication.
FINDINGS: After catheter placement, the tip lies within the superior
cavoatrial junction. The catheter aspirates and flushes normally and
is ready for immediate use.
IMPRESSION: Successful placement of a right internal jugular approach power
injectable Port-A-Cath. The catheter is ready for immediate use.

## 2016-05-20 IMAGING — CT CT HEAD W/O CM
3 series · 18 of 30 positions shown, 20 images · non-contrast
Comparison: 12/30/2013

CLINICAL DATA: Confusion, fever abnormal liver function. History of
pancreatic carcinoma.

EXAM:
CT HEAD WITHOUT CONTRAST
TECHNIQUE: Contiguous axial images were obtained from the base of the skull
through the vertex without intravenous contrast.

[Series 2: head w/o · axial · non-contrast · 0.46mm/px · z∈[+1029,+1144]mm · 6 of 33 slices shown, 8 images (1 of 2)]
[im 5/33  brain]
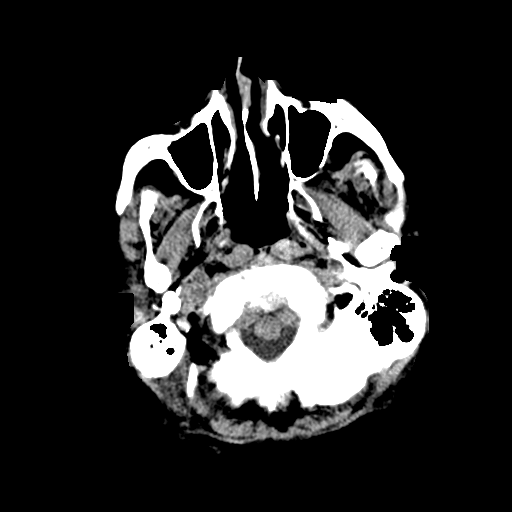
[im 5/33  bone]
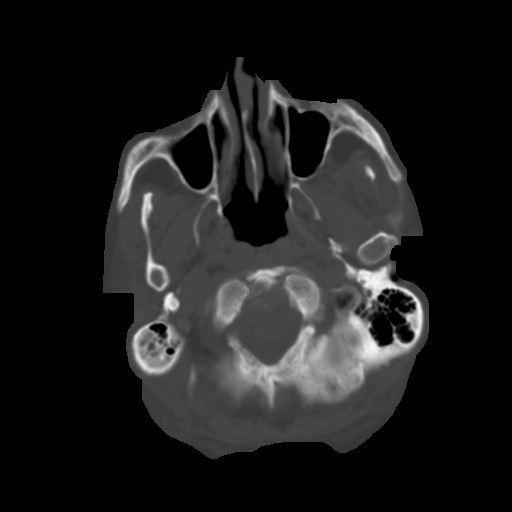
[im 10/33  brain]
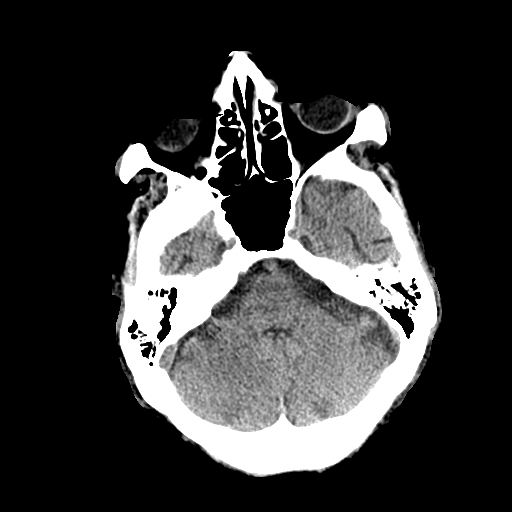
[im 14/33  brain]
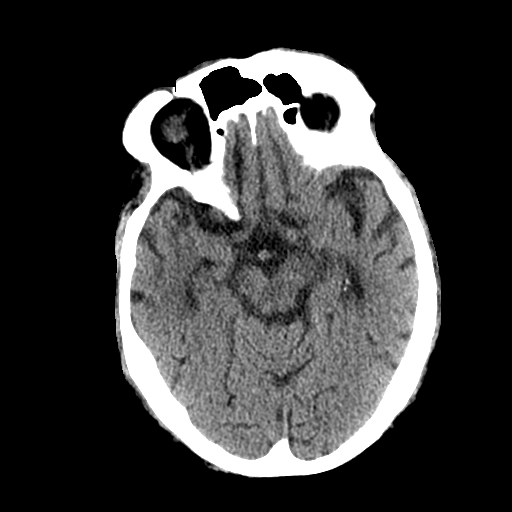
[im 19/33  brain]
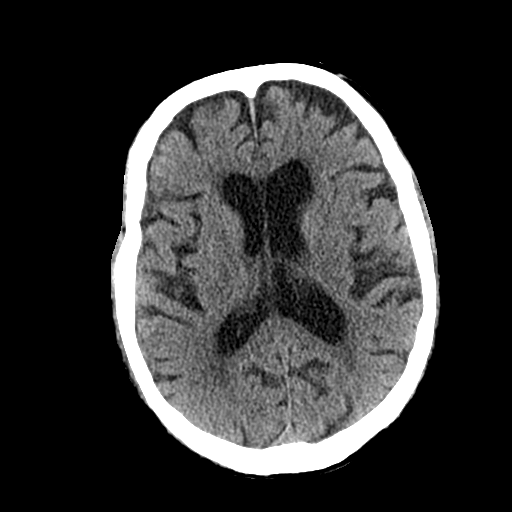
[im 23/33  brain]
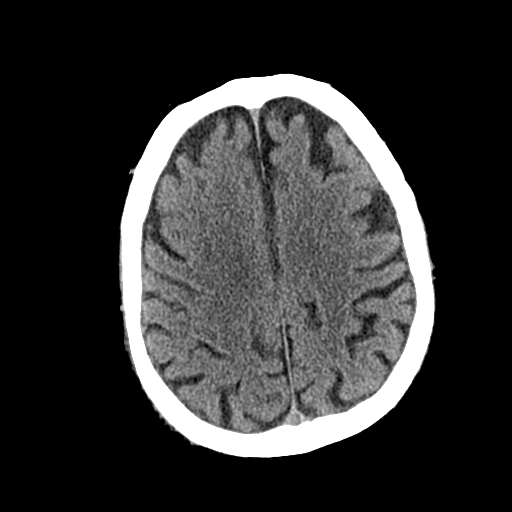
[im 23/33  bone]
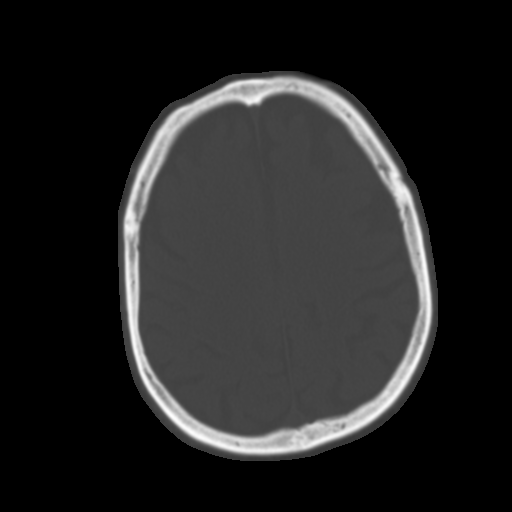
[im 28/33  brain]
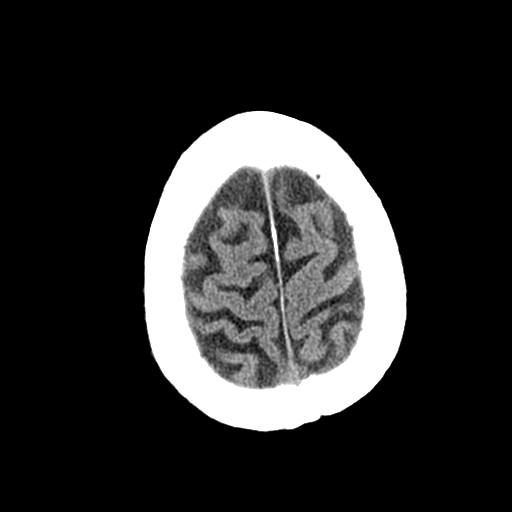

[Series 3: bone windows · axial · 0.46mm/px · z∈[+1021,+1120]mm · 6 of 55 slices shown]
[im 5/55  bone]
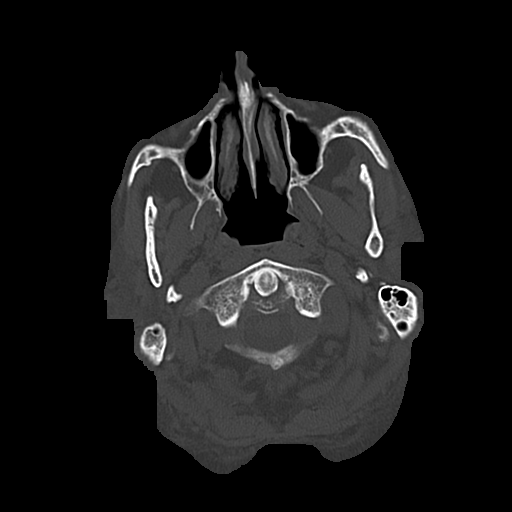
[im 13/55  bone]
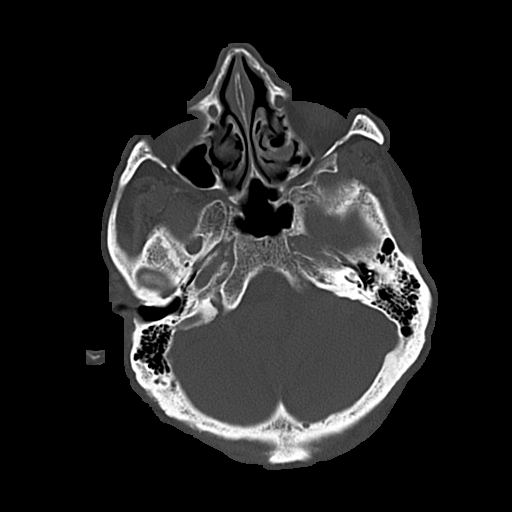
[im 17/55  bone]
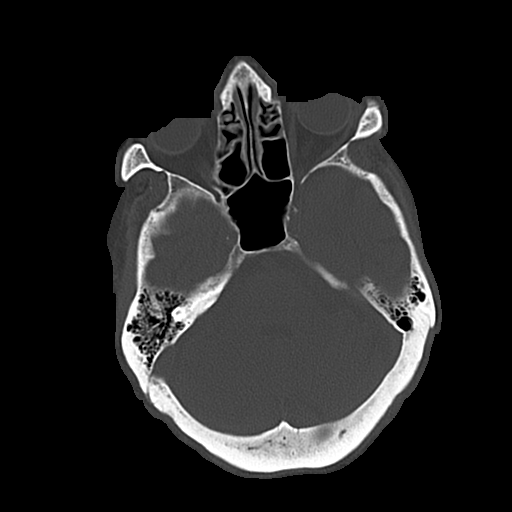
[im 25/55  bone]
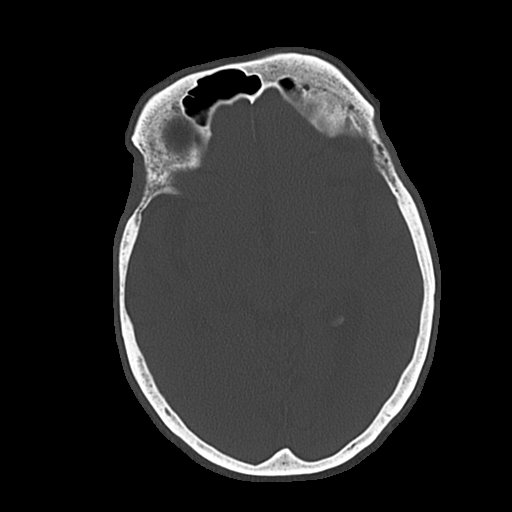
[im 30/55  bone]
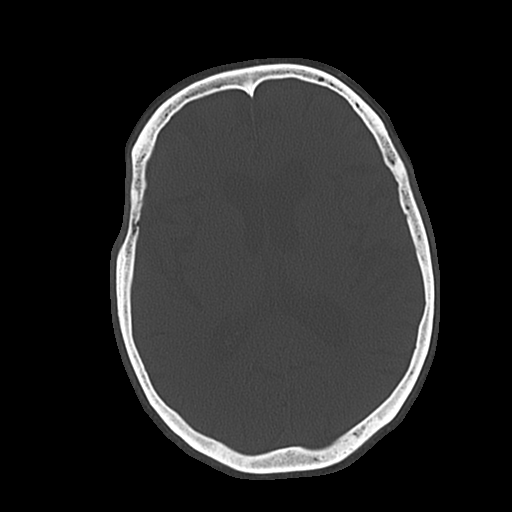
[im 38/55  bone]
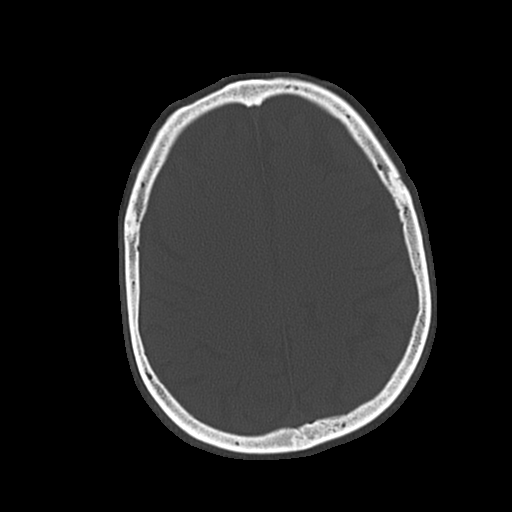

[Series 4: head w/o · axial · non-contrast · 0.46mm/px · z∈[+1027,+1142]mm · 6 of 33 slices shown (2 of 2)]
[im 5/33  brain]
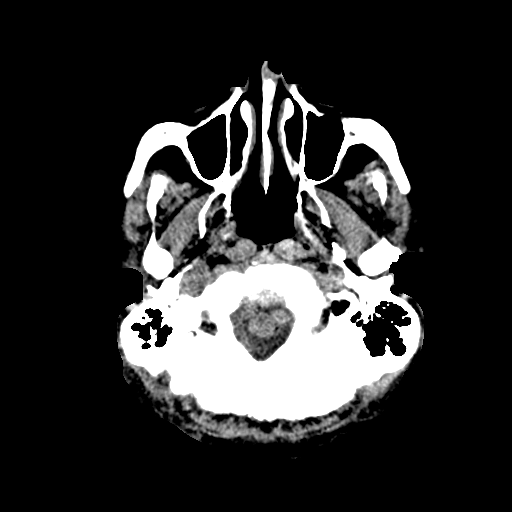
[im 10/33  brain]
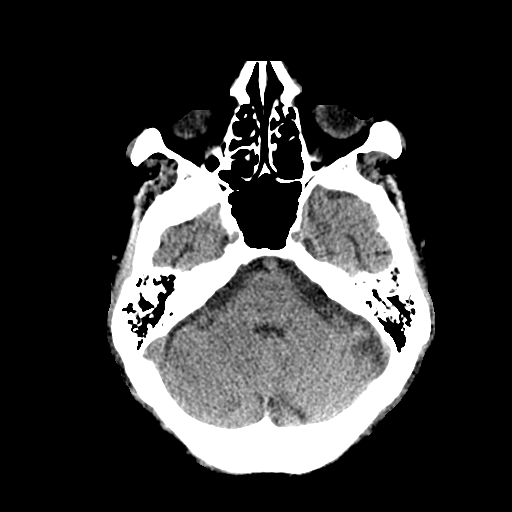
[im 14/33  brain]
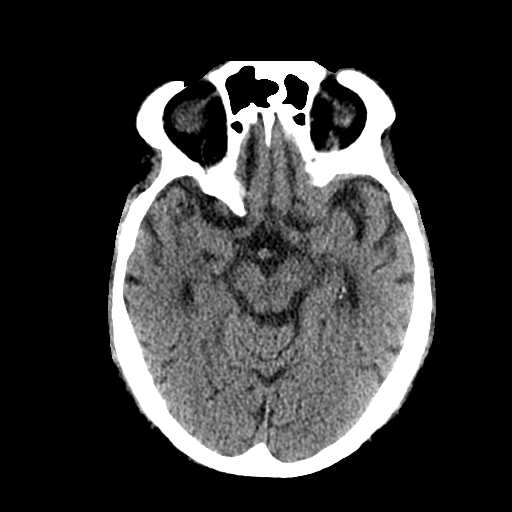
[im 19/33  brain]
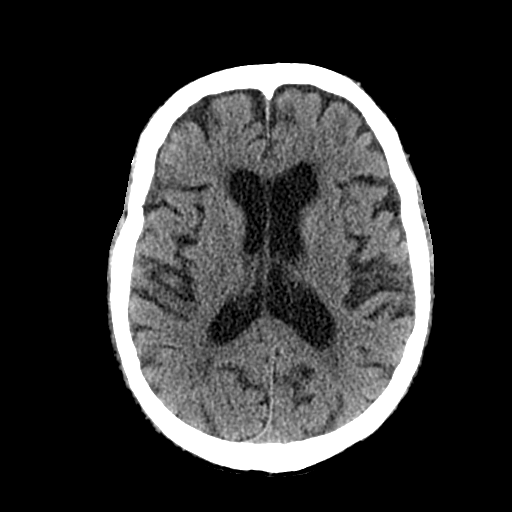
[im 23/33  brain]
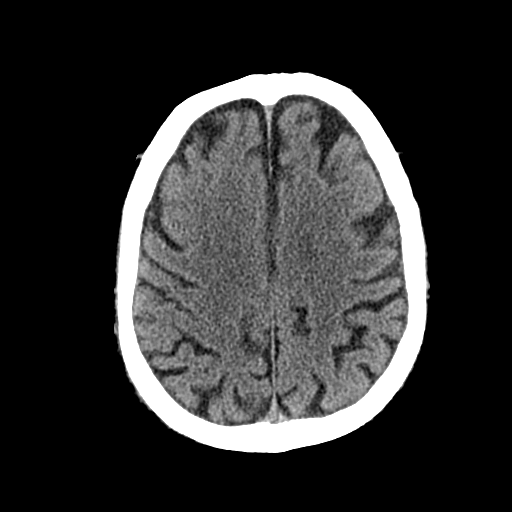
[im 28/33  brain]
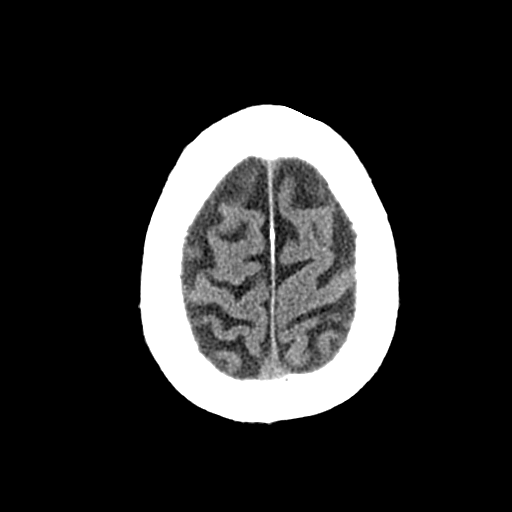

[18 of 30 positions shown; findings below may reference images not displayed]

FINDINGS: Stable cortical atrophy and periventricular white matter small
vessel ischemic changes. The brain demonstrates no evidence of
hemorrhage, infarction, edema, mass effect, extra-axial fluid
collection, hydrocephalus or mass lesion. The skull is unremarkable.
IMPRESSION: No acute findings.  Stable atrophy and small vessel disease.

## 2016-05-20 IMAGING — CR DG CHEST 1V PORT
1 series · 2 of 2 positions shown · non-contrast
Comparison: July 19, 2014.

CLINICAL DATA: Shortness of breath.

EXAM:
PORTABLE CHEST - 1 VIEW

[Series 1: AP · U · 2 of 2 slices shown]
[im 1/2]
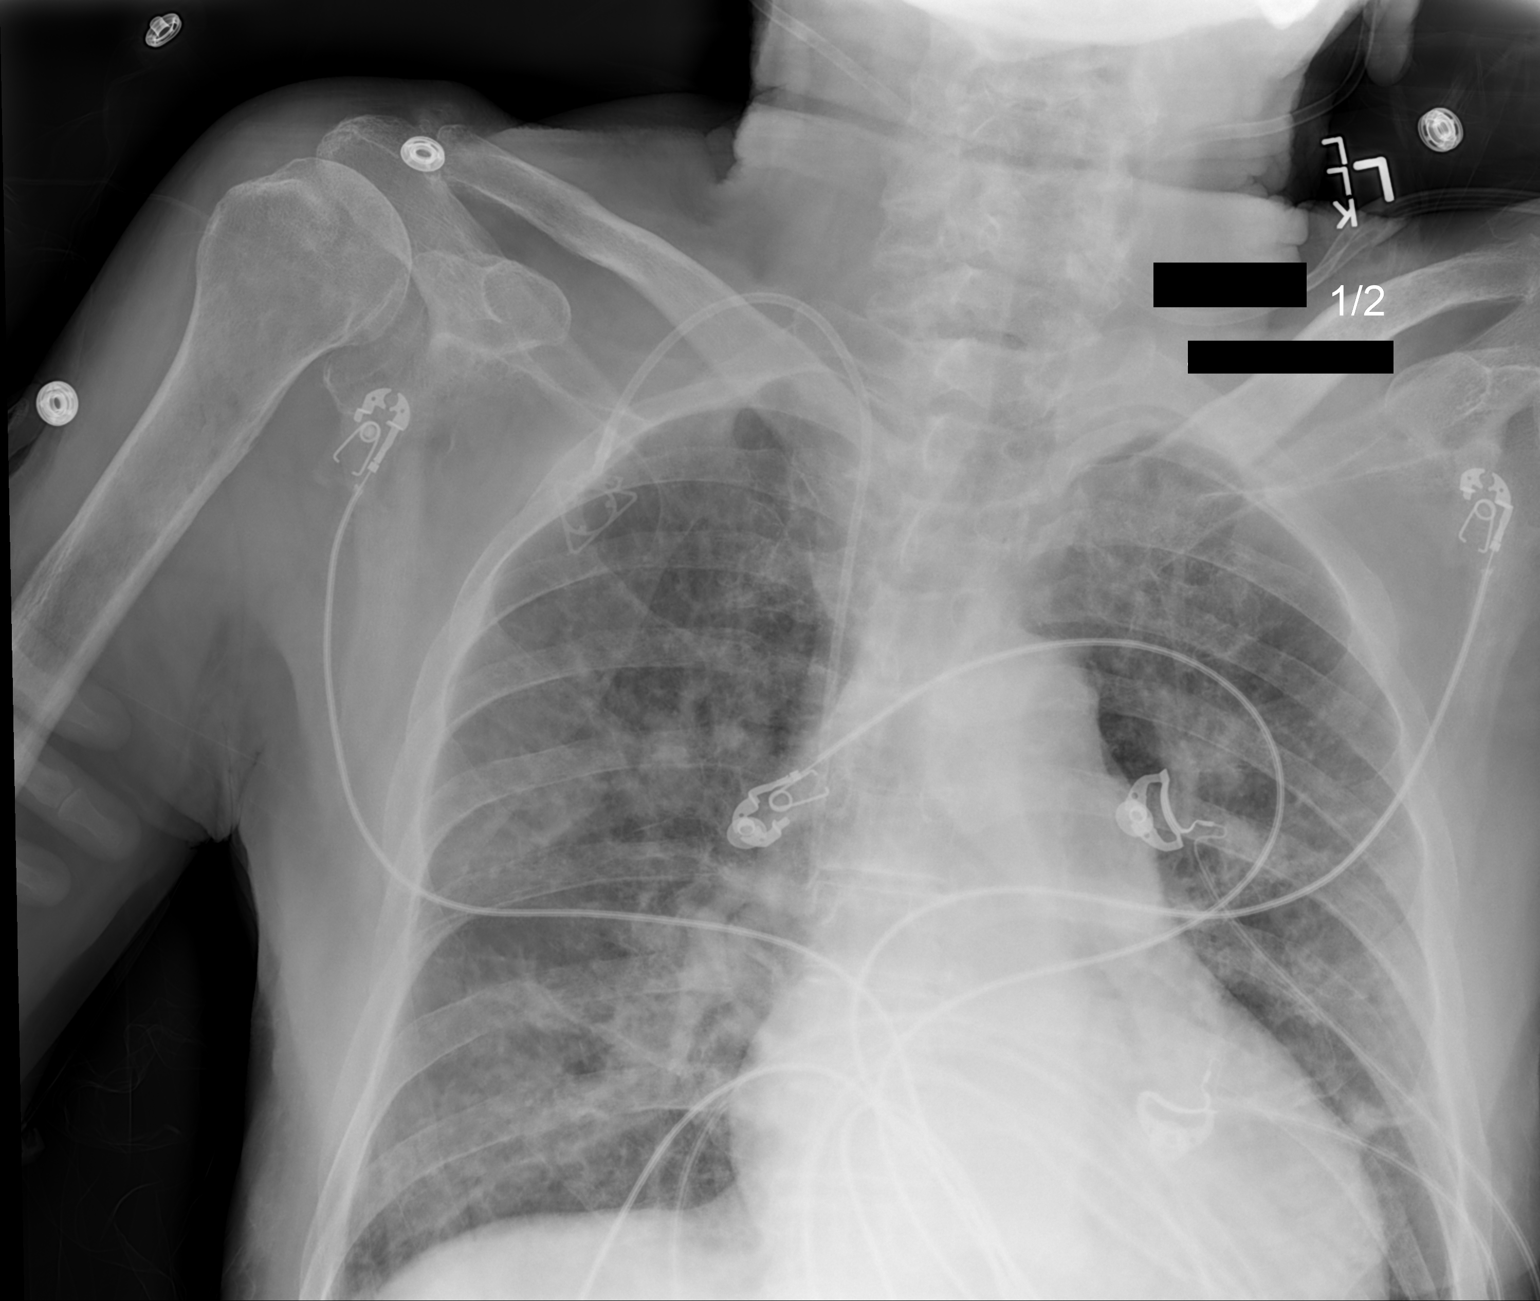
[im 2/2]
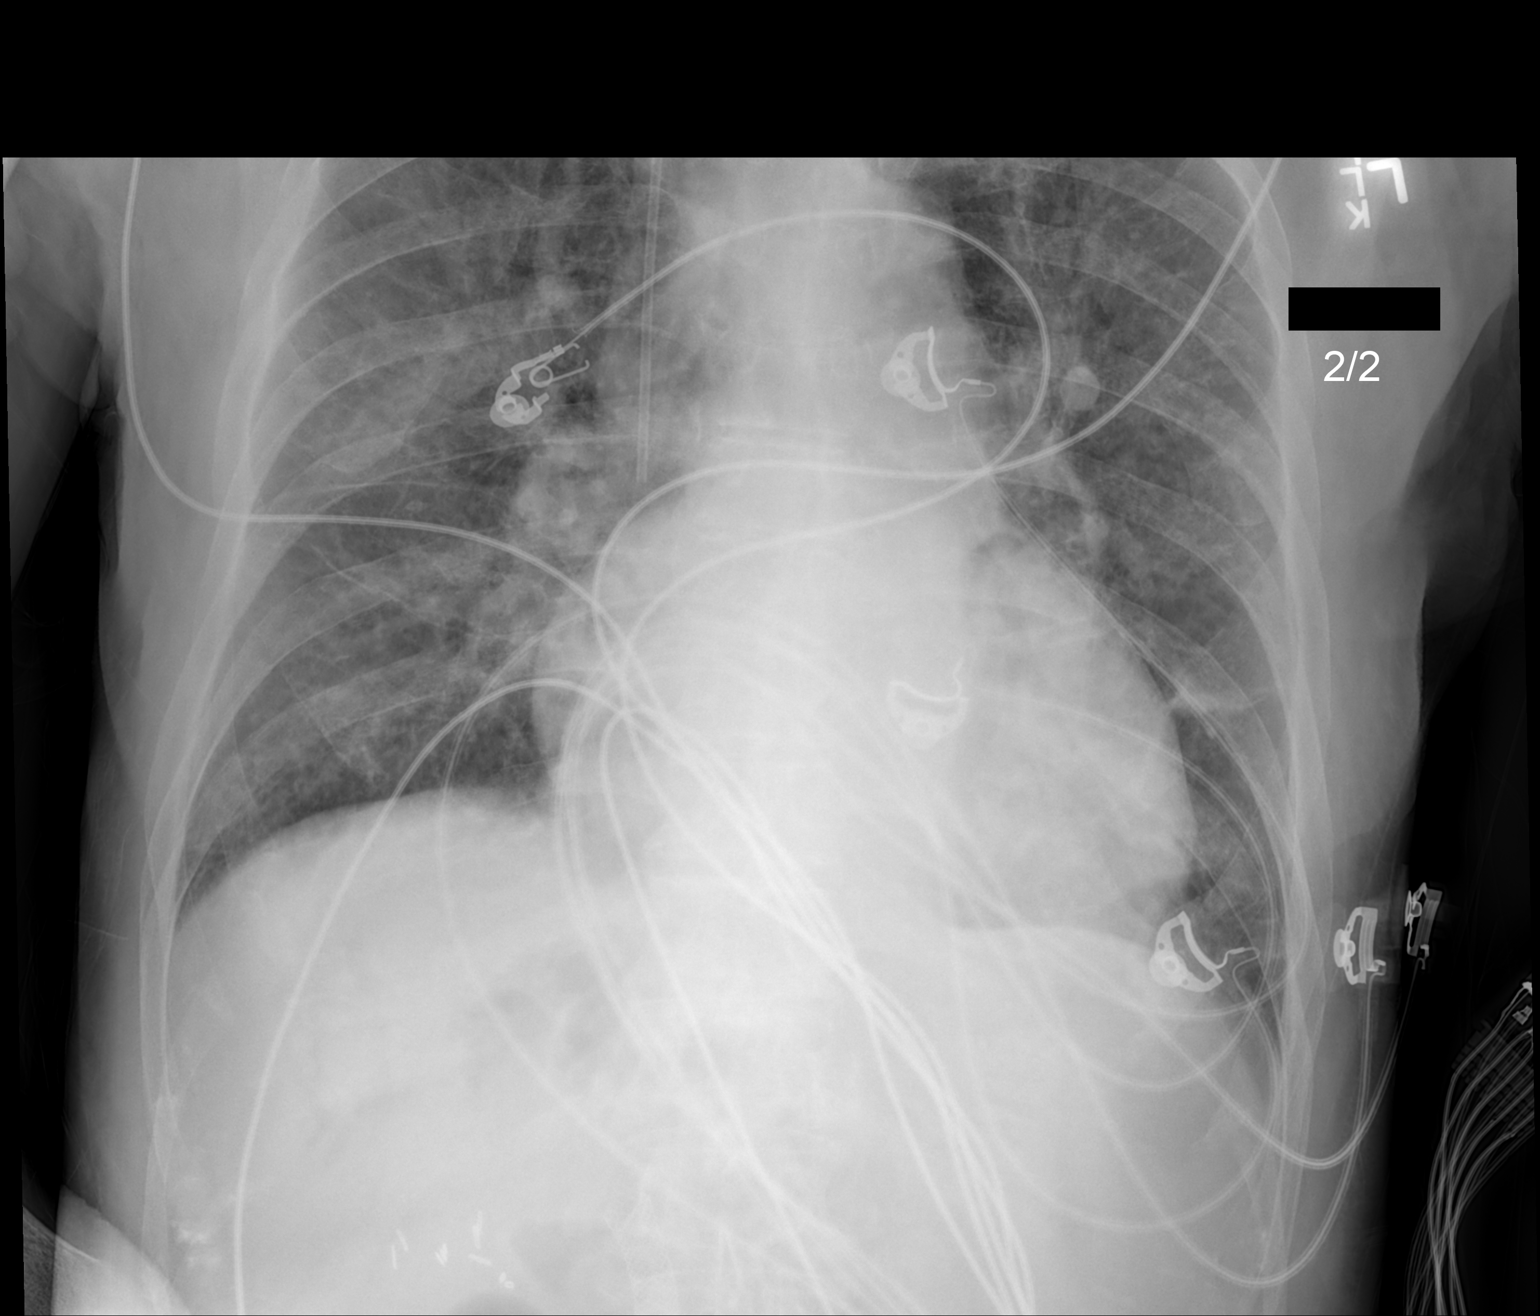

[2 of 2 positions shown; findings below may reference images not displayed]

FINDINGS: Stable cardiomediastinal silhouette. No pneumothorax or pleural
effusion is noted. Stable right internal jugular Port-A-Cath is
noted with distal tip overlying expected position of SVC. Increased
central pulmonary vascular congestion is noted as well as bilateral
interstitial densities concerning for pulmonary edema. Bony thorax
is intact.
IMPRESSION: Increased central pulmonary vascular congestion is noted with
probable bilateral pulmonary edema.

## 2016-05-26 IMAGING — CR DG HAND COMPLETE 3+V*L*
1 series · 1 of 1 positions shown · non-contrast
Comparison: None.

CLINICAL DATA: Status post fall 2 weeks ago. Left wrist and hand
pain. Initial encounter.

EXAM:
LEFT HAND - COMPLETE 3+ VIEW

[x wrist pa left]
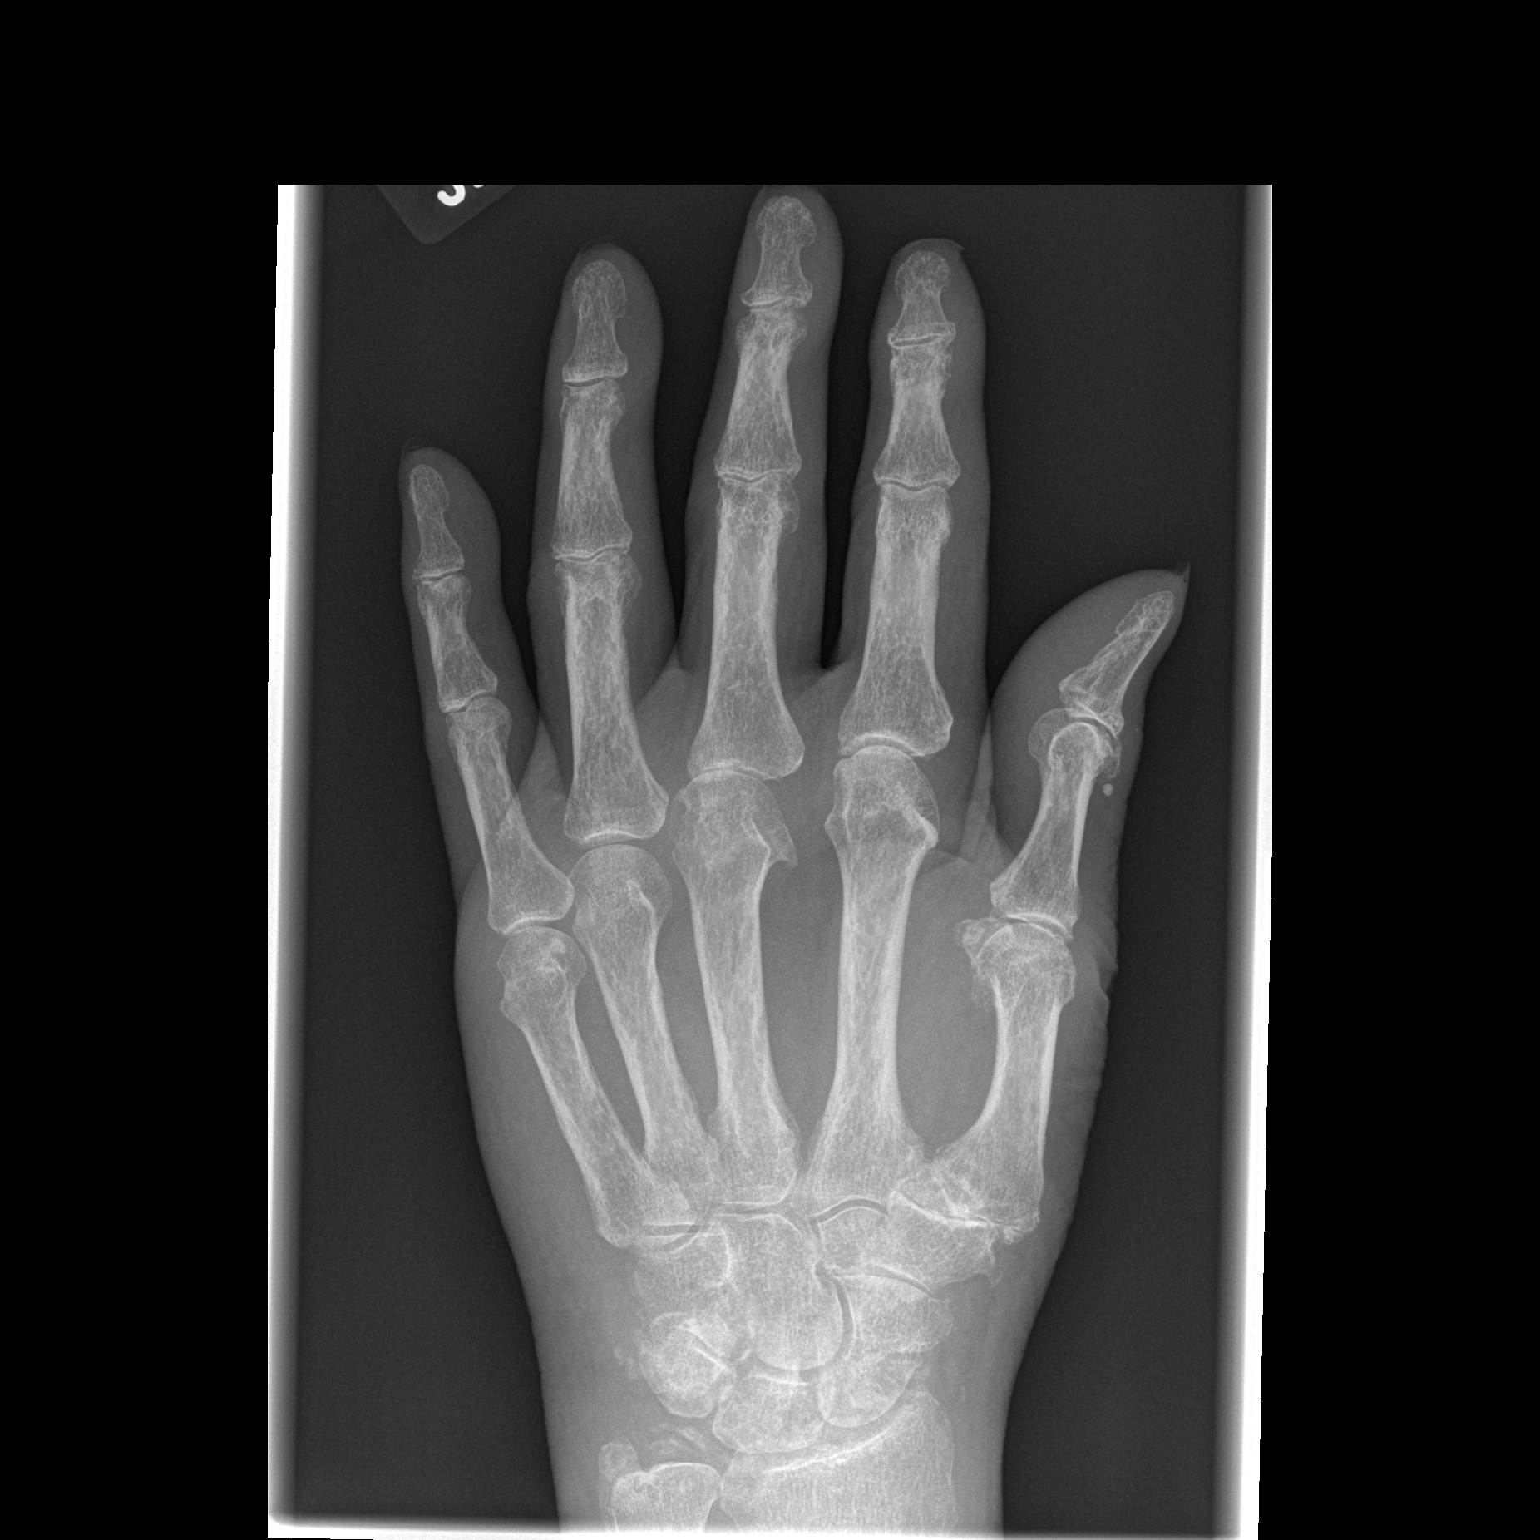

[1 of 1 positions shown; findings below may reference images not displayed]

FINDINGS: Soft tissues of the left wrist and hand appear swollen. No fracture
is identified. Chondrocalcinosis about the wrist is noted. The
patient has joint space narrowing and osteophytosis about the second
and third MCP joints. Marked degenerative change is seen about the
first CMC joint.
IMPRESSION: Soft tissue swelling without underlying acute bony or joint
abnormality.

Scattered osteoarthritis.

Degenerative change of the second and third MCP joints and TFC
chondrocalcinosis are compatible with CPPD.
